# Patient Record
Sex: Female | Born: 1950
Health system: Southern US, Community
[De-identification: ages and names within clinical notes are randomized; demographics above are authoritative.]

## PROBLEM LIST (undated history)

## (undated) DIAGNOSIS — Z955 Presence of coronary angioplasty implant and graft: Secondary | ICD-10-CM

## (undated) DIAGNOSIS — E079 Disorder of thyroid, unspecified: Secondary | ICD-10-CM

## (undated) DIAGNOSIS — A0472 Enterocolitis due to Clostridium difficile, not specified as recurrent: Secondary | ICD-10-CM

## (undated) DIAGNOSIS — R519 Headache, unspecified: Secondary | ICD-10-CM

## (undated) DIAGNOSIS — K649 Unspecified hemorrhoids: Secondary | ICD-10-CM

## (undated) DIAGNOSIS — D689 Coagulation defect, unspecified: Secondary | ICD-10-CM

## (undated) DIAGNOSIS — M1990 Unspecified osteoarthritis, unspecified site: Secondary | ICD-10-CM

## (undated) DIAGNOSIS — C50919 Malignant neoplasm of unspecified site of unspecified female breast: Secondary | ICD-10-CM

## (undated) DIAGNOSIS — I10 Essential (primary) hypertension: Secondary | ICD-10-CM

## (undated) DIAGNOSIS — F329 Major depressive disorder, single episode, unspecified: Secondary | ICD-10-CM

## (undated) DIAGNOSIS — C169 Malignant neoplasm of stomach, unspecified: Secondary | ICD-10-CM

## (undated) DIAGNOSIS — R51 Headache: Secondary | ICD-10-CM

## (undated) DIAGNOSIS — E039 Hypothyroidism, unspecified: Secondary | ICD-10-CM

## (undated) DIAGNOSIS — K219 Gastro-esophageal reflux disease without esophagitis: Secondary | ICD-10-CM

## (undated) DIAGNOSIS — K602 Anal fissure, unspecified: Secondary | ICD-10-CM

## (undated) DIAGNOSIS — K922 Gastrointestinal hemorrhage, unspecified: Secondary | ICD-10-CM

## (undated) DIAGNOSIS — C50319 Malignant neoplasm of lower-inner quadrant of unspecified female breast: Secondary | ICD-10-CM

## (undated) DIAGNOSIS — Z923 Personal history of irradiation: Secondary | ICD-10-CM

## (undated) DIAGNOSIS — Z9221 Personal history of antineoplastic chemotherapy: Secondary | ICD-10-CM

## (undated) DIAGNOSIS — K589 Irritable bowel syndrome without diarrhea: Secondary | ICD-10-CM

## (undated) DIAGNOSIS — I219 Acute myocardial infarction, unspecified: Secondary | ICD-10-CM

## (undated) DIAGNOSIS — N189 Chronic kidney disease, unspecified: Secondary | ICD-10-CM

## (undated) DIAGNOSIS — F419 Anxiety disorder, unspecified: Secondary | ICD-10-CM

## (undated) DIAGNOSIS — F32A Depression, unspecified: Secondary | ICD-10-CM

## (undated) DIAGNOSIS — I509 Heart failure, unspecified: Secondary | ICD-10-CM

## (undated) HISTORY — DX: Coagulation defect, unspecified: D68.9

## (undated) HISTORY — PX: VASCULAR SURGERY: SHX849

## (undated) HISTORY — DX: Acute myocardial infarction, unspecified: I21.9

## (undated) HISTORY — PX: CARDIAC CATHETERIZATION: SHX172

## (undated) HISTORY — DX: Disorder of thyroid, unspecified: E07.9

## (undated) HISTORY — DX: Irritable bowel syndrome, unspecified: K58.9

## (undated) HISTORY — PX: PORTA CATH INSERTION: CATH118285

## (undated) HISTORY — DX: Unspecified hemorrhoids: K64.9

## (undated) HISTORY — DX: Malignant neoplasm of lower-inner quadrant of unspecified female breast: C50.319

## (undated) HISTORY — DX: Heart failure, unspecified: I50.9

---

## 2004-03-12 ENCOUNTER — Other Ambulatory Visit: Payer: Self-pay

## 2006-03-06 ENCOUNTER — Ambulatory Visit: Payer: Self-pay

## 2007-11-18 ENCOUNTER — Ambulatory Visit: Payer: Self-pay | Admitting: Family Medicine

## 2008-02-03 ENCOUNTER — Ambulatory Visit: Payer: Self-pay | Admitting: Family Medicine

## 2008-02-05 ENCOUNTER — Other Ambulatory Visit: Payer: Self-pay

## 2008-02-05 ENCOUNTER — Emergency Department: Payer: Self-pay | Admitting: Emergency Medicine

## 2009-07-01 ENCOUNTER — Ambulatory Visit: Payer: Self-pay | Admitting: Family Medicine

## 2010-01-18 ENCOUNTER — Ambulatory Visit: Payer: Self-pay | Admitting: Family Medicine

## 2010-05-17 ENCOUNTER — Ambulatory Visit: Payer: Self-pay | Admitting: Otolaryngology

## 2010-05-17 ENCOUNTER — Other Ambulatory Visit: Payer: Self-pay | Admitting: Family Medicine

## 2010-06-02 ENCOUNTER — Ambulatory Visit: Payer: Self-pay | Admitting: Vascular Surgery

## 2010-06-22 ENCOUNTER — Ambulatory Visit: Payer: Self-pay | Admitting: Vascular Surgery

## 2010-06-29 ENCOUNTER — Inpatient Hospital Stay: Payer: Self-pay | Admitting: Vascular Surgery

## 2010-07-17 DIAGNOSIS — I219 Acute myocardial infarction, unspecified: Secondary | ICD-10-CM

## 2010-07-17 HISTORY — DX: Acute myocardial infarction, unspecified: I21.9

## 2010-07-27 ENCOUNTER — Ambulatory Visit: Payer: Self-pay | Admitting: Family Medicine

## 2010-09-06 ENCOUNTER — Ambulatory Visit: Payer: Self-pay | Admitting: Gastroenterology

## 2010-09-15 ENCOUNTER — Ambulatory Visit: Payer: Self-pay | Admitting: Gastroenterology

## 2010-09-15 HISTORY — PX: UPPER GI ENDOSCOPY: SHX6162

## 2010-09-15 HISTORY — PX: COLONOSCOPY W/ BIOPSIES: SHX1374

## 2010-09-16 LAB — PATHOLOGY REPORT

## 2010-09-29 ENCOUNTER — Ambulatory Visit: Payer: Self-pay | Admitting: Gastroenterology

## 2011-04-06 ENCOUNTER — Ambulatory Visit: Payer: Self-pay | Admitting: Family Medicine

## 2011-04-11 ENCOUNTER — Other Ambulatory Visit: Payer: Self-pay | Admitting: Internal Medicine

## 2011-04-11 ENCOUNTER — Inpatient Hospital Stay: Payer: Self-pay | Admitting: Internal Medicine

## 2011-06-12 ENCOUNTER — Ambulatory Visit: Payer: Self-pay | Admitting: Internal Medicine

## 2011-07-18 DIAGNOSIS — I509 Heart failure, unspecified: Secondary | ICD-10-CM

## 2011-07-18 HISTORY — DX: Heart failure, unspecified: I50.9

## 2011-09-02 ENCOUNTER — Observation Stay: Payer: Self-pay | Admitting: Internal Medicine

## 2011-09-02 LAB — COMPREHENSIVE METABOLIC PANEL
Albumin: 4.3 g/dL (ref 3.4–5.0)
Alkaline Phosphatase: 97 U/L (ref 50–136)
Anion Gap: 14 (ref 7–16)
BUN: 7 mg/dL (ref 7–18)
Calcium, Total: 9.4 mg/dL (ref 8.5–10.1)
Creatinine: 1.05 mg/dL (ref 0.60–1.30)
Glucose: 131 mg/dL — ABNORMAL HIGH (ref 65–99)
Osmolality: 255 (ref 275–301)
Potassium: 3.4 mmol/L — ABNORMAL LOW (ref 3.5–5.1)
Sodium: 127 mmol/L — ABNORMAL LOW (ref 136–145)
Total Protein: 7.5 g/dL (ref 6.4–8.2)

## 2011-09-02 LAB — CK TOTAL AND CKMB (NOT AT ARMC)
CK, Total: 212 U/L (ref 21–215)
CK, Total: 214 U/L (ref 21–215)

## 2011-09-02 LAB — MAGNESIUM: Magnesium: 1.5 mg/dL — ABNORMAL LOW

## 2011-09-02 LAB — CBC
HCT: 37.8 % (ref 35.0–47.0)
HGB: 12.5 g/dL (ref 12.0–16.0)
MCV: 95 fL (ref 80–100)
Platelet: 270 10*3/uL (ref 150–440)
RDW: 12.8 % (ref 11.5–14.5)
WBC: 4.4 10*3/uL (ref 3.6–11.0)

## 2011-09-02 LAB — TROPONIN I
Troponin-I: 0.08 ng/mL — ABNORMAL HIGH
Troponin-I: 0.07 ng/mL — ABNORMAL HIGH

## 2011-09-03 LAB — CBC WITH DIFFERENTIAL/PLATELET
Basophil #: 0 10*3/uL (ref 0.0–0.1)
Eosinophil #: 0.1 10*3/uL (ref 0.0–0.7)
Eosinophil %: 1.1 %
HGB: 11.4 g/dL — ABNORMAL LOW (ref 12.0–16.0)
Lymphocyte #: 1.9 10*3/uL (ref 1.0–3.6)
MCH: 31.5 pg (ref 26.0–34.0)
MCHC: 33.2 g/dL (ref 32.0–36.0)
Monocyte #: 0.4 10*3/uL (ref 0.0–0.7)
Neutrophil %: 52 %
Platelet: 214 10*3/uL (ref 150–440)
RBC: 3.61 10*6/uL — ABNORMAL LOW (ref 3.80–5.20)
RDW: 12.9 % (ref 11.5–14.5)

## 2011-09-03 LAB — BASIC METABOLIC PANEL
Anion Gap: 7 (ref 7–16)
BUN: 11 mg/dL (ref 7–18)
Co2: 24 mmol/L (ref 21–32)
Creatinine: 1.18 mg/dL (ref 0.60–1.30)
EGFR (African American): 60 — ABNORMAL LOW
Glucose: 90 mg/dL (ref 65–99)
Potassium: 4.4 mmol/L (ref 3.5–5.1)
Sodium: 132 mmol/L — ABNORMAL LOW (ref 136–145)

## 2011-09-03 LAB — TROPONIN I: Troponin-I: 0.06 ng/mL — ABNORMAL HIGH

## 2011-09-03 LAB — CK TOTAL AND CKMB (NOT AT ARMC)
CK, Total: 195 U/L (ref 21–215)
CK-MB: 2.7 ng/mL (ref 0.5–3.6)

## 2011-11-06 ENCOUNTER — Ambulatory Visit: Payer: Self-pay | Admitting: Internal Medicine

## 2011-11-22 ENCOUNTER — Ambulatory Visit: Payer: Self-pay | Admitting: Nephrology

## 2012-01-03 ENCOUNTER — Inpatient Hospital Stay: Payer: Self-pay | Admitting: Internal Medicine

## 2012-01-03 LAB — CBC
HCT: 33.9 % — ABNORMAL LOW (ref 35.0–47.0)
HGB: 11.2 g/dL — ABNORMAL LOW (ref 12.0–16.0)
MCH: 30.6 pg (ref 26.0–34.0)
MCHC: 32.9 g/dL (ref 32.0–36.0)
MCV: 93 fL (ref 80–100)
Platelet: 321 10*3/uL (ref 150–440)
RBC: 3.65 10*6/uL — ABNORMAL LOW (ref 3.80–5.20)
RDW: 12.8 % (ref 11.5–14.5)

## 2012-01-03 LAB — COMPREHENSIVE METABOLIC PANEL
Albumin: 3.5 g/dL (ref 3.4–5.0)
Anion Gap: 9 (ref 7–16)
Chloride: 105 mmol/L (ref 98–107)
Co2: 25 mmol/L (ref 21–32)
Creatinine: 1.15 mg/dL (ref 0.60–1.30)
EGFR (Non-African Amer.): 51 — ABNORMAL LOW
Potassium: 3.7 mmol/L (ref 3.5–5.1)
SGOT(AST): 53 U/L — ABNORMAL HIGH (ref 15–37)
SGPT (ALT): 36 U/L
Sodium: 139 mmol/L (ref 136–145)
Total Protein: 7.1 g/dL (ref 6.4–8.2)

## 2012-01-03 LAB — PRO B NATRIURETIC PEPTIDE: B-Type Natriuretic Peptide: 4152 pg/mL — ABNORMAL HIGH (ref 0–125)

## 2012-01-03 LAB — PROTIME-INR: Prothrombin Time: 12.7 secs (ref 11.5–14.7)

## 2012-01-03 LAB — CK TOTAL AND CKMB (NOT AT ARMC)
CK, Total: 133 U/L (ref 21–215)
CK, Total: 141 U/L (ref 21–215)
CK-MB: 1.1 ng/mL (ref 0.5–3.6)
CK-MB: 2.8 ng/mL (ref 0.5–3.6)

## 2012-01-03 LAB — TROPONIN I
Troponin-I: 0.04 ng/mL
Troponin-I: 0.35 ng/mL — ABNORMAL HIGH

## 2012-01-04 LAB — COMPREHENSIVE METABOLIC PANEL
Albumin: 3.7 g/dL (ref 3.4–5.0)
Alkaline Phosphatase: 112 U/L (ref 50–136)
Anion Gap: 8 (ref 7–16)
Bilirubin,Total: 0.5 mg/dL (ref 0.2–1.0)
Calcium, Total: 9.2 mg/dL (ref 8.5–10.1)
Chloride: 97 mmol/L — ABNORMAL LOW (ref 98–107)
Co2: 30 mmol/L (ref 21–32)
Creatinine: 1.4 mg/dL — ABNORMAL HIGH (ref 0.60–1.30)
EGFR (Non-African Amer.): 40 — ABNORMAL LOW
Glucose: 107 mg/dL — ABNORMAL HIGH (ref 65–99)
Osmolality: 276 (ref 275–301)
Potassium: 4 mmol/L (ref 3.5–5.1)
SGOT(AST): 26 U/L (ref 15–37)
SGPT (ALT): 27 U/L
Sodium: 135 mmol/L — ABNORMAL LOW (ref 136–145)

## 2012-01-04 LAB — CK TOTAL AND CKMB (NOT AT ARMC)
CK, Total: 130 U/L (ref 21–215)
CK-MB: 2.6 ng/mL (ref 0.5–3.6)

## 2012-01-04 LAB — CBC WITH DIFFERENTIAL/PLATELET
Basophil #: 0.1 10*3/uL (ref 0.0–0.1)
Basophil %: 1.3 %
Eosinophil #: 0.2 10*3/uL (ref 0.0–0.7)
HCT: 36.4 % (ref 35.0–47.0)
Lymphocyte #: 2.6 10*3/uL (ref 1.0–3.6)
MCH: 30.6 pg (ref 26.0–34.0)
MCHC: 33.6 g/dL (ref 32.0–36.0)
MCV: 91 fL (ref 80–100)
Monocyte #: 0.7 x10 3/mm (ref 0.2–0.9)
Platelet: 309 10*3/uL (ref 150–440)

## 2012-01-05 LAB — BASIC METABOLIC PANEL
Calcium, Total: 9.4 mg/dL (ref 8.5–10.1)
Creatinine: 1.25 mg/dL (ref 0.60–1.30)
EGFR (African American): 54 — ABNORMAL LOW
EGFR (Non-African Amer.): 46 — ABNORMAL LOW
Glucose: 107 mg/dL — ABNORMAL HIGH (ref 65–99)
Potassium: 4.8 mmol/L (ref 3.5–5.1)
Sodium: 132 mmol/L — ABNORMAL LOW (ref 136–145)

## 2012-07-17 DIAGNOSIS — Z923 Personal history of irradiation: Secondary | ICD-10-CM

## 2012-07-17 DIAGNOSIS — C50919 Malignant neoplasm of unspecified site of unspecified female breast: Secondary | ICD-10-CM

## 2012-07-17 DIAGNOSIS — Z9221 Personal history of antineoplastic chemotherapy: Secondary | ICD-10-CM

## 2012-07-17 HISTORY — DX: Personal history of antineoplastic chemotherapy: Z92.21

## 2012-07-17 HISTORY — PX: MASTECTOMY: SHX3

## 2012-07-17 HISTORY — PX: BREAST BIOPSY: SHX20

## 2012-07-17 HISTORY — DX: Personal history of irradiation: Z92.3

## 2012-07-17 HISTORY — DX: Malignant neoplasm of unspecified site of unspecified female breast: C50.919

## 2012-11-18 ENCOUNTER — Ambulatory Visit: Payer: Self-pay | Admitting: Internal Medicine

## 2012-12-05 ENCOUNTER — Ambulatory Visit: Payer: Self-pay | Admitting: Internal Medicine

## 2013-01-20 ENCOUNTER — Ambulatory Visit: Payer: Self-pay | Admitting: Emergency Medicine

## 2013-01-20 DIAGNOSIS — C50319 Malignant neoplasm of lower-inner quadrant of unspecified female breast: Secondary | ICD-10-CM

## 2013-01-20 HISTORY — DX: Malignant neoplasm of lower-inner quadrant of unspecified female breast: C50.319

## 2013-01-27 ENCOUNTER — Ambulatory Visit: Payer: Self-pay | Admitting: General Surgery

## 2013-01-27 ENCOUNTER — Encounter: Payer: Self-pay | Admitting: General Surgery

## 2013-01-27 ENCOUNTER — Ambulatory Visit (INDEPENDENT_AMBULATORY_CARE_PROVIDER_SITE_OTHER): Payer: BC Managed Care – PPO | Admitting: General Surgery

## 2013-01-27 VITALS — BP 120/78 | HR 78 | Resp 16 | Ht 66.0 in | Wt 203.0 lb

## 2013-01-27 DIAGNOSIS — C50311 Malignant neoplasm of lower-inner quadrant of right female breast: Secondary | ICD-10-CM

## 2013-01-27 DIAGNOSIS — C50319 Malignant neoplasm of lower-inner quadrant of unspecified female breast: Secondary | ICD-10-CM

## 2013-01-27 DIAGNOSIS — N63 Unspecified lump in unspecified breast: Secondary | ICD-10-CM

## 2013-01-27 LAB — PATHOLOGY REPORT

## 2013-01-27 NOTE — Progress Notes (Signed)
Patient ID: Abigail Ortega, female   DOB: 1951-04-28, 62 y.o.   MRN: [id number]  Chief Complaint  Patient presents with  . Breast Problem    HPI Abigail Ortega is a 62 y.o. female who presents for a breast evaluation. Patient had gone to [facility] for routine mammogram 12/04/12, she returned for additional views on 12/05/12. She then had a right breast stereo biopsy done on 01/20/13 by the radiologist at [facility].   Patient denies any breast symptoms, no soreness, discharge, nor has she noticed any lumps or bumps. Patient had a mother with breast cancer. Patient does preform self breast exams and does get routine mammograms.  The patient is accompanied today by her sister was present for the interview and exam.. HPI  Past Medical History  Diagnosis Date  . CHF (congestive heart failure) 2013  . Heart attack 2012  . Clotting disorder     blood clots  . IBS (irritable bowel syndrome)   . Thyroid disease   . Hemorrhoids     Past Surgical History  Procedure Laterality Date  . Vascular surgery    . Carotid stent  2012    2 stents    Family History  Problem Relation Age of Onset  . Cancer Mother 41    breast  . Cancer Father     colon    Social History History  Substance Use Topics  . Smoking status: Former Smoker -- 20 years  . Smokeless tobacco: Never Used  . Alcohol Use: No    No Known Allergies  Current Outpatient Prescriptions  Medication Sig Dispense Refill  . acetaminophen (TYLENOL) 500 MG tablet Take 500 mg by mouth every 6 (six) hours as needed for pain.      Marland Kitchen atorvastatin (LIPITOR) 40 MG tablet Take 1 tablet by mouth daily.      . clopidogrel (PLAVIX) 75 MG tablet Take 1 tablet by mouth daily.      . [facility] ASPIRIN 81 MG EC tablet Take 1 tablet by mouth daily.      . furosemide (LASIX) 20 MG tablet Take 1 tablet by mouth daily.      Marland Kitchen levothyroxine (SYNTHROID, LEVOTHROID) 112 MCG tablet Take 1 tablet by mouth daily.      Marland Kitchen loratadine (CLARITIN) 10 MG tablet Take 10  mg by mouth daily.      . pantoprazole (PROTONIX) 40 MG tablet Take 1 tablet by mouth daily.      Marland Kitchen PARoxetine (PAXIL) 40 MG tablet Take 40 mg by mouth daily.      . ramipril (ALTACE) 5 MG capsule Take 1 capsule by mouth daily.      . TOPROL XL 25 MG 24 hr tablet Take 1 tablet by mouth daily.       No current facility-administered medications for this visit.    Review of Systems Review of Systems  Constitutional: Negative.   Respiratory: Negative.   Cardiovascular: Negative.     Blood pressure 120/78, pulse 78, resp. rate 16, height 5\' 6"  (1.676 m), weight 203 lb (92.08 kg).  Physical Exam Physical Exam  Constitutional: She is oriented to person, place, and time. She appears well-developed and well-nourished.  Eyes: Conjunctivae are normal. No scleral icterus.  Neck: Neck supple.  Cardiovascular: Normal rate and regular rhythm.   Pulmonary/Chest: Effort normal and breath sounds normal. Right breast exhibits no inverted nipple, no mass, no nipple discharge, no skin change and no tenderness. Left breast exhibits no inverted nipple, no mass, no nipple discharge,  no skin change and no tenderness.    Lymphadenopathy:    She has no cervical adenopathy.    She has no axillary adenopathy.  Neurological: She is alert and oriented to person, place, and time.    Data Reviewed Pathology showed invasive ductal carcinoma.  Ultrasound examination was completed to determine that the biopsy cavity could be identified. 4 cm from the nipple to 3:30 o'clock position of the right breast a 0.9 x 1.1 x 1.1 cm biopsy cavity is evident. This is less than 1 cm from the overlying skin.  Assessment    Right breast cancer.   Past history cardiac stent 2012.  Past history congestive heart failure 2013.    Plan    Options for management were reviewed: 1) breast conservation with postoperative radiation therapy versus mastectomy. The pros and cons of each were reviewed. These were present as  equivalent procedures. Laboratory and radiologic studies will be necessary.  The patient denies any recent cardiac symptoms, and shows no evidence of congestive heart failure on today's exam.     Patient to have the following labs drawn today: CBC, Met C, CEA, and CA 27-29. Also, patient to have a chest x-ray PA and lateral at [facility].    Earline Mayotte 01/28/2013, 8:40 PM

## 2013-01-27 NOTE — Patient Instructions (Addendum)
Breast Cancer, Early Stage, Surgery Choices Breast cancer is the most common cancer, except for skin cancer. It is the second most common cause of death, except for lung cancer, in women. The risk of a woman developing breast cancer is 12.5%. Breast cancer in women younger than 63 years old is rare. Most of these cancer cases have spread to the breast from another part of the body (metastatic). Finding out that you have breast cancer is an emotional shock and is difficult to understand, because it is an overwhelming, serious, and life-threatening disease. You will need to make important decisions about how you want it treated.  As a woman with early-stage breast cancer (DCIS or Stage I, IIA, IIB, IIIA, or IV) you may be able to choose which type of breast surgery to have. Your caregiver will help you understand what stage you are in. Often, your choices are:  Removal of the cancerous part(s) of the breast (breast-sparing surgery).  Lumpectomy.  Partial/Segmental mastectomy.  Removal of the whole breast (simple mastectomy). Research shows that women with early-stage breast cancer who have breast-sparing surgery along with radiation therapy live as long as those who have a mastectomy. Most women with breast cancer will lead long, healthy lives after treatment.  Reconstructive surgery. This type of surgery is to make the breast and nipple area as normal looking as it was before the mastectomy. It is usually done by a plastic surgeon at the same time as the mastectomy. There are several types of reconstructive surgery that should be discussed with your caregiver and plastic surgeon.  Lymph node surgery.  Sentinel (removing those lymph nodes in the armpit that breast cancer spreads to first).  Axillary lymph node dissection (removing all the lymph nodes in the armpit). TREATMENT Treatment for breast cancer usually begins a few weeks after diagnosis. In these weeks, you should meet with a surgeon,  learn the facts about your surgery choices, treatment options, get a second opinion, and think about what is important to you.  Treatment choices include:  Surgery.  Radiation.  Chemotherapy.  Medicines, hormones.  Reconstructive breast surgery.  Any combination of the above treatments. Choose which kind of surgery to have. If radiation, chemotherapy, or hormone therapy is considered, this also should be discussed before the surgery. Radical breast surgery is rarely performed now. Usually, lumpectomy, partial/segmental mastectomy, simple mastectomy in combination with radiation, chemotherapy, and/or hormone treatment is recommended. Clinical trial protocols (specific treatment plan with surgery and radiation, chemotherapy, and hormones) for breast cancer have been put in place in hospitals, universities, medical schools, and cancer centers all over the country. These patients are followed for several years to find out what treatment gives the best results for the protocol given, for the different stages of breast cancer.  Most women want to make this choice with help from their caregiver. After all, the kind of surgery you have will affect how you look and feel. But it is often hard to decide what to do. The more information you have, the better the decision you can make.  Talk to a surgeon about your breast cancer surgery choices. Find out what happens during surgery, types of problems that sometimes occur, and other kinds of treatment (if any) you will need after surgery. Be sure to ask a lot of questions and learn as much as you can. You may also wish to talk with family members, friends, or others who have had breast cancer surgery, radiation therapy, chemotherapy, or hormone therapy.  After talking with a surgeon, you may want a second opinion. This means talking with another doctor or surgeon who might tell you about other treatment options. They may simply give you information that can help  you feel better about the choice you are making. Do not worry about hurting your surgeon's feelings. It is common practice to get a second opinion, and some insurance companies require it. Plus, it is better to get a second opinion than to worry that you have made the wrong choice. STAGES OF BREAST CANCER Staging of breast cancer depends on the size of the tumor, if and how far it has spread, lymph node involvement in the armpits, and whether it has spread to other parts of the body. If you are unsure of the stage of your cancer, ask your caregiver. The following are the stages of breast cancer:  Stage 0: This means you either have DCIS or LCIS.  DCIS (Ductal Carcinoma in Situ) is very early breast cancer. It is often too small to form a lump. Your caregiver may refer to DCIS as noninvasive cancer.  LCIS (Lobular Carcinoma in Situ) is not cancer, but it may increase the chance that you will get breast cancer. Talk with your caregiver about treatment options, if you are diagnosed with LCIS.  The 5 year survival rate in this stage is almost 100%.  Stage I:  Your cancer is less than 1 inch across (2 centimeters) or about the size of a quarter. The cancer is only in the breast. It has not spread to lymph nodes or other parts of your body.  Stage IIA:  No cancer is found in your breast. However, cancer is found in the lymph nodes under your arm, or  Your cancer is 1 inch (2 centimeters) or smaller. It has spread to 3 lymph nodes or less (the lymph nodes under your arm), or  Your cancer is about 1-2 inches (2-5 centimeters). It has not spread to the lymph nodes under your arm.  Stage IIB:  Your cancer is about 1-2 inches (2-5 centimeters). It has spread to the lymph nodes under your arm, or  Your cancer is larger than 2 inches (5 centimeters). It has not spread to the lymph nodes under your arm.  The 5 year survival rate is 85 to 90%.  Stage IIIA:  No cancer is found in the breast. It is  found in lymph nodes under your arm. The lymph nodes are attached to each other, or  Your cancer is 2 inches (5 centimeters) or smaller. It has spread to lymph nodes under your arm. The lymph nodes are attached to each other, or  Your cancer is larger than 2 inches (5 centimeters) and has spread to lymph nodes under your arm, and they are not attached to each other.  Your cancer is smaller than 2 inches (5 centimeters) and has spread to lymph nodes above your collarbone.  Inflammatory cancer of the breast (red rash, tender and swollen breasts) is in the stage III category.  The 5 year survival rate is 45 to 67%.  Stage IV:  Cancer cells have spread to other parts of the body.  The 5 year survival rate is about 20%. ABOUT LYMPH NODES  Lymph nodes are part of your body's immune system, which helps fight infection and disease. Lymph nodes are small, round, and clustered (like a bunch of grapes) throughout your body.  Axillary lymph nodes are in the area under your arm. Breast cancer  may spread to these lymph nodes first, even when the tumor in the breast is small. This is why most surgeons take out some of these lymph nodes at the time of breast surgery.  Lymphedema is swelling caused by a buildup of lymph fluid. You may have this type of swelling in your arm, if your lymph nodes are taken out with surgery or damaged by radiation therapy.  Lymphedema can show up soon after surgery. The symptoms are often mild and last for a short time.  Lymphedema can show up months or even years after cancer treatment is over. Often, lymphedema develops after an insect bite, minor injury, or burn on the arm where your lymph nodes were removed. Sometimes, this can be painful. One way to reduce the swelling is to work with a caregiver who specializes in rehabilitation, or with a physical therapist.  Sentinel lymph node biopsy is surgery to remove as few lymph nodes as possible from under the arm. The surgeon  first injects a dye in the breast to see which lymph nodes the breast tumor drains into. Then, he or she removes these nodes to see if they have any cancer. If there is no cancer, the surgeon may leave the other lymph nodes in place. This surgery is fairly new and is being studied experimentally. Talk with your surgeon if you want to learn more. COMPARE YOUR CHOICES  BREAST-SPARING SURGERY  Is this surgery right for me?   Breast-sparing surgery with radiation is a safe choice for most women who have early-stage breast cancer. This means that your cancer is DCIS or at Stage I, IIA, IIB, or IIIA. What are the names of the different kinds of surgery?  Lumpectomy.  Partial mastectomy.  Breast-sparing surgery.  Segmental mastectomy.  Simple mastectomy.  Sentinel lymph node removal.  Axillary lymph node excision. What doctors am I likely to see?  Oncologist.  Surgeon.  Radiation Oncologist.  Chemotherapy Oncologist. What will my breast look like after surgery?  Your breast should look a lot like it did before surgery. But if your tumor is large, your breast may look different or smaller after breast-sparing surgery. Will I have feeling in the area around my breast?  Yes. You should still have feeling in your breast, nipple, and areola (dark area around your nipple). Will I have pain after the surgery?  You may have pain after surgery. Talk with your caregiver about ways to control this pain. What other problems can I expect?  You may feel very tired after radiation therapy. You may get swelling in your arm (lymphedema). Will I need more surgery?  Maybe. You may need more surgery to remove lymph nodes from under your arm. Also, if the surgeon does not remove all your cancer the first time, you may need more surgery. What other types of treatment will I need?  You may need radiation therapy, given almost every day for 5 to 8 weeks. You may also need chemotherapy, hormone  therapy, or both. Will insurance pay for my surgery?  Check with your insurance company to find out how much it pays for breast cancer surgery and other needed treatments. Will the type of surgery I have affect how long I live?  Women with early-stage breast cancer who have breast-sparing surgery followed by radiation live just as long as women who have a mastectomy. Most women with breast cancer will lead long, healthy lives after treatment. What are the chances that my cancer will come back after  surgery?  About 10% of women who have breast-sparing surgery along with radiation therapy get cancer in the same breast within 12 years. If this happens, you will need a mastectomy, but it will not affect how long you live. MASTECTOMY SURGERY Is this surgery right for me?   Mastectomy is a safe choice for women who have early-stage breast cancer (DCIS, Stage I, IIA, IIB, or IIIA). You may need a mastectomy if:  You have small breasts and a large tumor.  You have cancer in more than one part of your breast.  The tumor is under the nipple.  You do not have access to radiation therapy. What are the names of the different kinds of surgery?  Total mastectomy.  Modified radical mastectomy (not usually done now).  Double mastectomy.  Simple mastectomy. What doctors am I likely to see?  Oncologist.  Surgeon.  Radiation Oncologist.  Chemotherapy Oncologist. What will my breast look like after surgery?  Your breast and nipple will be removed. You will have a flat chest on the side of your body where the breast was removed. Will I have feeling in the area around my breast?  Maybe. After surgery, you will have no feeling (numb) in your chest wall and maybe also under your arm. This numb feeling should go away in 1-2 years, but it will never feel like it did before. Also, the skin where your breast was may feel tight. Will I have pain after the surgery?  You may have pain after surgery.  Talk with your caregiver about ways to control this pain. What other problems can I expect?  You may have pain in your neck or back. You may feel out of balance, if you had large breasts and do not have reconstruction surgery. You may get swelling in your arm (lymphedema). Will I need more surgery?  Maybe. You may need surgery to remove lymph nodes from under your arm. Also, if you have problems after your mastectomy, you may need to see your surgeon for treatment. What other types of treatment will I need?  You may also need chemotherapy, hormone therapy, or radiation therapy. Some women get all 3 types of therapy or any combination of the 3. Will insurance pay for my surgery?  Check with your insurance company to find out how much it pays for breast cancer surgery and other needed treatments. Will the type of surgery I have affect how long I live?  Women with early-stage breast cancer who have a mastectomy live as long as women who have breast-sparing surgery followed by radiation therapy. Most women with breast cancer will lead long, healthy lives after treatment. What are the chances that my cancer will come back after surgery?  About 5% of women who have a mastectomy will get cancer on the same side of their chest within 12 years. MASTECTOMY AND BREAST RECONSTRUCTION SURGERY  Is this surgery right for me?  If you have a mastectomy, you might also want breast reconstruction surgery.  You can choose to have reconstruction surgery at the same time as your mastectomy.  You can wait and have it at a later date. What are the names of the different kinds of surgery?  Breast implant.  Tissue flap surgery.  Reconstructive plastic surgery. What doctors am I likely to see?  Oncologist.  Surgeon.  Radiation Oncologist.  Reconstructive Plastic Surgeon.  Chemotherapy Oncologist. What will my breast look like after surgery?  Although you will have a breast-like shape, your  breast  will not look the same as it did before surgery. Will I have feeling in the area around my breast?  No. The area around your breast will always be numb (have no feeling). Will I have pain after the surgery?  You are likely to have pain after major surgery, such as mastectomy and reconstruction surgery. There are many ways to deal with pain. Let your caregiver know if you need relief from pain. What other problems can I expect?  It may take you many weeks or even months to recover from breast reconstruction surgery. If you have an implant, you may get infections, pain, or hardness. Also, you may not like how your breast-like shape looks. You may need more surgery if your implant breaks or leaks. If you have tissue flap surgery, you may lose strength in the part of your body where the flap came from. You may get swelling in your arm (lymphedema). Will I need more surgery?  Yes. You will need surgery at least 2 more times to build a new breast-like shape. With implants, you may need more surgery months or years later. You may also need surgery to remove lymph nodes from under your arm. What other types of treatment will I need?  You may need chemotherapy, hormone therapy, or radiation therapy. Some women get all 3 types of therapy or any combination of the 3. Will insurance pay for my surgery?  Check with your insurance company to find out if it pays for breast reconstruction surgery. You should also ask if your insurance will pay for problems that may result from breast reconstruction surgery. Will the type of surgery I have affect how long I live?  Women with early-stage breast cancer who have a mastectomy live the same amount of time as women who have breast-sparing surgery followed by radiation therapy. Most women with breast cancer will lead long, healthy lives after treatment. What are the chances that my cancer will come back after surgery?  About 5% of women who have a mastectomy will  get cancer on the same side of their chest within 12 years. Breast reconstruction surgery does not affect the chances of your cancer coming back. Where can I learn more about coping with life after cancer? To learn more about life after cancer, you might want to read "Facing Forward: Life After Cancer Treatment." You can get this booklet at [employer] or by calling 1-800-4-CANCER. THINK ABOUT WHAT IS IMPORTANT TO YOU   After you have talked with your surgeon and learned the facts, you may also want to talk with your spouse or partner, family, friends, or other women who have had breast cancer surgery. The more you know about breast cancer, the treatment, and what happens afterward, the more informed you will be and the easier it will be for you to make good decisions that are important to you.  Think about what is important to you. Here are some questions to think about:  Do I want to get a second opinion?  How important is it to me how my breast looks after cancer surgery?  How important is it to me how my breast feels after cancer surgery?  If I have breast-sparing surgery or more extensive surgery, am I willing to get radiation, hormone and/or chemotherapy?  If I have a mastectomy, do I also want breast reconstruction surgery?  If I have breast reconstruction surgery, do I want it at the same time as my mastectomy?  What treatment  does my insurance cover, and what do I need to pay for?  Who would I like to talk with about my surgery, radiation, hormone, and chemotherapy choices?  What else do I want to know, do, or learn before I make my choice about breast cancer surgery?  After I have learned all I could and have talked with my surgeon, I will make a choice that feels right for me.  A patient who takes the time to be well-informed will feel more comfortable about her decisions regarding surgery, and will feel better about the procedure following the  surgery.  Coping and Support:  Be open and willing to talk to your family and friends about your cancer. Family and friends can be your best support group, to help you work through your concerns and worries.  Discussing your thoughts, concerns, and intimacy issues with your spouse or partner is very important and helpful.  Join support groups to share and learn how others with breast cancer cope and deal with their cancer. The [employer] Society can help you find support groups in your area.  Breast cancer may affect your confidence and feelings about being a total woman. It may interfere with your intimate relationship with your partner. Counseling may be necessary to help you overcome these feelings.  Talk to a counselor, your clergyperson, psychologist, or psychiatrist.  Talk to a medical social worker, if you have financial questions or problems.  Do not be afraid to ask for help, especially during your treatment.  Learn to be as independent as possible, as soon as possible. Document Released: 09/23/2003 Document Revised: 09/25/2011 Document Reviewed: 05/31/2009 [facility] Patient Information 2014 [place], [place].  Patient to have labs and chest x-ray.

## 2013-01-28 ENCOUNTER — Encounter: Payer: Self-pay | Admitting: General Surgery

## 2013-01-28 DIAGNOSIS — C50311 Malignant neoplasm of lower-inner quadrant of right female breast: Secondary | ICD-10-CM | POA: Insufficient documentation

## 2013-01-28 LAB — CANCER ANTIGEN 27.29: CA 27.29: 31.1 U/mL (ref 0.0–38.6)

## 2013-01-28 LAB — CBC WITH DIFFERENTIAL/PLATELET
Eos: 2 % (ref 0–5)
HCT: 34.2 % (ref 34.0–46.6)
Hemoglobin: 10.9 g/dL — ABNORMAL LOW (ref 11.1–15.9)
Immature Granulocytes: 0 % (ref 0–2)
Lymphocytes Absolute: 1.6 10*3/uL (ref 0.7–3.1)
Lymphs: 23 % (ref 14–46)
Monocytes: 6 % (ref 4–12)
RDW: 13.5 % (ref 12.3–15.4)
WBC: 7 10*3/uL (ref 3.4–10.8)

## 2013-01-28 LAB — COMPREHENSIVE METABOLIC PANEL
ALT: 11 IU/L (ref 0–32)
Calcium: 9.5 mg/dL (ref 8.6–10.2)
Chloride: 104 mmol/L (ref 97–108)
Glucose: 99 mg/dL (ref 65–99)
Potassium: 4.2 mmol/L (ref 3.5–5.2)
Total Protein: 7.1 g/dL (ref 6.0–8.5)

## 2013-01-28 LAB — CEA: CEA: 6.3 ng/mL — ABNORMAL HIGH (ref 0.0–4.7)

## 2013-01-29 ENCOUNTER — Other Ambulatory Visit: Payer: Self-pay | Admitting: *Deleted

## 2013-01-29 ENCOUNTER — Telehealth: Payer: Self-pay | Admitting: *Deleted

## 2013-01-29 DIAGNOSIS — C50319 Malignant neoplasm of lower-inner quadrant of unspecified female breast: Secondary | ICD-10-CM

## 2013-01-29 DIAGNOSIS — R6889 Other general symptoms and signs: Secondary | ICD-10-CM

## 2013-01-29 NOTE — Telephone Encounter (Signed)
Message copied by Nicholes Mango on Wed Jan 29, 2013 10:19 AM ------      Message from: Spencer, Merrily Pew      Created: Tue Jan 28, 2013  4:56 PM       Please notify the patient some of her lab tests were a little off, and I need for her to get a PET/ CT to check them out.      DX: Breast cancer, elevated LFT's and CEA. Thanks. ------

## 2013-01-29 NOTE — Telephone Encounter (Signed)
Patient notified as instructed. This patient has been scheduled for a PET/CT scan at [facility] for 02-03-13 at 1 pm (arrive 12:45 pm). This patient is aware of date, time, and all instructions. She will call if she has further questions.

## 2013-02-03 ENCOUNTER — Ambulatory Visit: Payer: Self-pay | Admitting: General Surgery

## 2013-02-05 ENCOUNTER — Encounter: Payer: Self-pay | Admitting: General Surgery

## 2013-02-07 ENCOUNTER — Telehealth: Payer: Self-pay | Admitting: *Deleted

## 2013-02-07 NOTE — Telephone Encounter (Signed)
Message copied by Nicholes Mango on Fri Feb 07, 2013 10:35 AM ------      Message from: [place], Merrily Pew      Created: Fri Feb 07, 2013  7:18 AM       Notify the patient that PET is OK.  Proceed with scheduling for right breast surgery.  She will need to to have a colonoscopy and upper endoscopy after breast surgery completed.       ----- Message -----         From: Jena Gauss, CMA         Sent: 02/05/2013   4:38 PM           To: Earline Mayotte, MD                   ------

## 2013-02-07 NOTE — Telephone Encounter (Signed)
Patient notified as instructed. She wishes to think about whether she would like to have a lumpectomy vs mastectomy and call the office back next week to arrange. This patient also reports that she has already had an upper and lower endoscopy completed through Dr. Marlan Palau office which patient reported as normal. I have called their office and asked them to forward endoscopy reports and pathology reports (if any).

## 2013-02-09 ENCOUNTER — Emergency Department: Payer: Self-pay | Admitting: Emergency Medicine

## 2013-02-10 ENCOUNTER — Telehealth: Payer: Self-pay | Admitting: *Deleted

## 2013-02-10 NOTE — Telephone Encounter (Signed)
Patient called the office reporting that she had to go to [facility] ED yesterday and was diagnosed with an ear infection and upper respiratory infection. She was given antibiotics. This patient also reports that she would like to proceed with right mastectomy. How long will we need to wait before she can have surgery?  I requested color copies from upper and lower endoscopy from [facility]. This was completed on 09-15-10 by Dr. Bluford Kaufmann not Dr. Niel Hummer as we originally thought. See reports on your desk. I also called and requested pathology report. Cindy from Dr. Nigel Bridgeman office to forward.

## 2013-02-11 ENCOUNTER — Other Ambulatory Visit: Payer: Self-pay | Admitting: General Surgery

## 2013-02-11 DIAGNOSIS — C50319 Malignant neoplasm of lower-inner quadrant of unspecified female breast: Secondary | ICD-10-CM

## 2013-02-12 ENCOUNTER — Telehealth: Payer: Self-pay | Admitting: *Deleted

## 2013-02-12 NOTE — Telephone Encounter (Signed)
Patient is concerned that her surgery will be outpatient. She reports that after talking with her nurse at work that she feels she ought to stay at least one night in the hospital. This patient reports she has no one to take care of her when she gets home. She is also right handed and feels this will make it difficult to be by herself upon discharge. Please advise.  At present she is scheduled for a right mastectomy on 02-21-13 at [facility].

## 2013-02-13 ENCOUNTER — Ambulatory Visit: Payer: Self-pay | Admitting: General Surgery

## 2013-02-15 ENCOUNTER — Telehealth: Payer: Self-pay | Admitting: General Surgery

## 2013-02-15 NOTE — Telephone Encounter (Signed)
Patient had called with reservations re: outpatient surgery rather than overnight observation.  Discussed indications for observation. Likely, game day decision.   Importance of having someone at home the first night reviewed.  She has concerns that [employer]  Will not pay if out patient will have staff clarify.

## 2013-02-18 ENCOUNTER — Telehealth: Payer: Self-pay | Admitting: *Deleted

## 2013-02-18 NOTE — Telephone Encounter (Signed)
States she is feeling much better. States she has a post nasal drip and will try to take a over the counter antihistamine. No fever or chills. Coughing better and mucous is clear. Will call if symptoms change.

## 2013-02-18 NOTE — Telephone Encounter (Signed)
Message copied by Currie Paris on Tue Feb 18, 2013  8:34 AM ------      Message from: Abigail Ortega      Created: Sat Feb 15, 2013 10:11 AM       Patient reported some congestion to [facility] staff at time of preadmit.  On antibiotics. Give her a call and see how she is doing. If no cough and feeling well, we can proceed w/ mastectomy scheduled for 8/8 ------

## 2013-02-21 ENCOUNTER — Ambulatory Visit: Payer: Self-pay | Admitting: General Surgery

## 2013-02-21 DIAGNOSIS — C50319 Malignant neoplasm of lower-inner quadrant of unspecified female breast: Secondary | ICD-10-CM

## 2013-02-21 HISTORY — PX: BREAST SURGERY: SHX581

## 2013-02-24 ENCOUNTER — Encounter: Payer: Self-pay | Admitting: General Surgery

## 2013-02-24 ENCOUNTER — Ambulatory Visit (INDEPENDENT_AMBULATORY_CARE_PROVIDER_SITE_OTHER): Payer: BC Managed Care – PPO | Admitting: General Surgery

## 2013-02-24 DIAGNOSIS — C50319 Malignant neoplasm of lower-inner quadrant of unspecified female breast: Secondary | ICD-10-CM

## 2013-02-24 DIAGNOSIS — J440 Chronic obstructive pulmonary disease with acute lower respiratory infection: Secondary | ICD-10-CM

## 2013-02-24 MED ORDER — AZITHROMYCIN 250 MG PO TABS: ORAL_TABLET | ORAL | Status: DC

## 2013-02-24 NOTE — Progress Notes (Signed)
Patient came in today for a wound check.  The right breast mastectomy looks clean, with no signs of infection noted. Rewrapped patient.Rx given. Follow up as scheduled.

## 2013-02-25 LAB — PATHOLOGY REPORT

## 2013-02-26 ENCOUNTER — Encounter: Payer: Self-pay | Admitting: General Surgery

## 2013-02-28 ENCOUNTER — Ambulatory Visit (INDEPENDENT_AMBULATORY_CARE_PROVIDER_SITE_OTHER): Payer: BC Managed Care – PPO | Admitting: *Deleted

## 2013-02-28 DIAGNOSIS — C50319 Malignant neoplasm of lower-inner quadrant of unspecified female breast: Secondary | ICD-10-CM

## 2013-02-28 NOTE — Progress Notes (Signed)
Patient here today for follow up right mastectomy. Area clean, no signs infection noted. Rewrapped with kerlex and ace for comfort Drain output record sheet continued

## 2013-02-28 NOTE — Patient Instructions (Addendum)
Follow up 03-04-13 Refill hydrocodone #30 per Dr Lemar Livings Continue drain record sheet May use heating pad for comfort -caution reviewed make sure put pad on area that is not "numb"

## 2013-03-04 ENCOUNTER — Ambulatory Visit (INDEPENDENT_AMBULATORY_CARE_PROVIDER_SITE_OTHER): Payer: BC Managed Care – PPO | Admitting: General Surgery

## 2013-03-04 VITALS — BP 130/78 | HR 76 | Resp 14 | Ht 67.0 in | Wt 201.0 lb

## 2013-03-04 DIAGNOSIS — I5042 Chronic combined systolic (congestive) and diastolic (congestive) heart failure: Secondary | ICD-10-CM | POA: Insufficient documentation

## 2013-03-04 DIAGNOSIS — Z9010 Acquired absence of unspecified breast and nipple: Secondary | ICD-10-CM | POA: Insufficient documentation

## 2013-03-04 DIAGNOSIS — C50319 Malignant neoplasm of lower-inner quadrant of unspecified female breast: Secondary | ICD-10-CM

## 2013-03-04 DIAGNOSIS — C50311 Malignant neoplasm of lower-inner quadrant of right female breast: Secondary | ICD-10-CM

## 2013-03-04 DIAGNOSIS — I509 Heart failure, unspecified: Secondary | ICD-10-CM | POA: Insufficient documentation

## 2013-03-04 NOTE — Progress Notes (Signed)
Patient ID: Abigail Ortega, female   DOB: [birth year], 62 y.o.   MRN: [id number]  Chief Complaint  Patient presents with  . Routine Post Op    HPI Abigail Ortega is a 62 y.o. female.  Patient here today for postop visit right mastectomy done 02-21-13.  States she is still having some discomfort and still using pain medications and using heating pad. Drain remains in place. The patient reports some improvement in her cough with a recent use of Zithromax. She does not report any colored sputum.  Drainage volume still running 45-50 cc per day. HPI  Past Medical History  Diagnosis Date  . CHF (congestive heart failure) 2013  . IBS (irritable bowel syndrome)   . Thyroid disease   . Hemorrhoids   . Clotting disorder     blood clots  . Heart attack 2012    coronary stent x2 completed by Dr. Juliann Pares.  . Malignant neoplasm of lower-inner quadrant of female breast January 20, 2013.    invasive mammary cancer, minimal 0.85 cm.  Histologic grade 1.    Past Surgical History  Procedure Laterality Date  . Vascular surgery    . Breast surgery Right 2014    right mastectomy    Family History  Problem Relation Age of Onset  . Cancer Mother 50    breast  . Cancer Father     colon    Social History History  Substance Use Topics  . Smoking status: Former Smoker -- 20 years  . Smokeless tobacco: Never Used  . Alcohol Use: No    No Known Allergies  Current Outpatient Prescriptions  Medication Sig Dispense Refill  . acetaminophen (TYLENOL) 500 MG tablet Take 500 mg by mouth every 6 (six) hours as needed for pain.      Marland Kitchen atorvastatin (LIPITOR) 40 MG tablet Take 1 tablet by mouth daily.      . clopidogrel (PLAVIX) 75 MG tablet Take 1 tablet by mouth daily.      . [facility] ASPIRIN 81 MG EC tablet Take 1 tablet by mouth daily.      . furosemide (LASIX) 20 MG tablet Take 1 tablet by mouth daily.      Marland Kitchen HYDROcodone-acetaminophen (NORCO/VICODIN) 5-325 MG per tablet Take 1 tablet by mouth every  6 (six) hours as needed for pain.      Marland Kitchen levothyroxine (SYNTHROID, LEVOTHROID) 112 MCG tablet Take 1 tablet by mouth daily.      Marland Kitchen loratadine (CLARITIN) 10 MG tablet Take 10 mg by mouth daily.      . pantoprazole (PROTONIX) 40 MG tablet Take 1 tablet by mouth daily.      Marland Kitchen PARoxetine (PAXIL) 40 MG tablet Take 40 mg by mouth daily.      . ramipril (ALTACE) 5 MG capsule Take 1 capsule by mouth daily.      . TOPROL XL 25 MG 24 hr tablet Take 1 tablet by mouth daily.       No current facility-administered medications for this visit.    Review of Systems Review of Systems  Constitutional: Negative.   Respiratory: Positive for cough.   Cardiovascular: Negative.     Blood pressure 130/78, pulse 76, resp. rate 14, height 5\' 7"  (1.702 m), weight 201 lb (91.173 kg).  Physical Exam Physical Exam  Constitutional: She appears well-developed and well-nourished.  Cardiovascular: Normal rate, regular rhythm and normal heart sounds.   Pulmonary/Chest: Breath sounds normal.  Right mastectomy site healing well/    Data Reviewed Pathology  showed 5 of 25 nodes positive. ER/PR positive, HER-2/neu overexpression.  Assessment    Stage II carcinoma of the right breast.    Plan    The patient was advised of adjuvant chemotherapy as appropriate. She was very distressed, reporting that she thought that she had her breast removed chemotherapy would not be recommended. This had not been the case, and treatment of the breast was defined as independent of any recommendations for additional therapy. With the finding of an ER/PR positive tumor especially with HER-2/neu overexpression, she would be a candidate with likely substantial benefit with adjuvant chemotherapy. At this time she is not ready to me with a medical oncologist. Maryclare Labrador review this again next week when she follows up for potential drain removal.       Earline Mayotte 03/05/2013, 5:47 PM

## 2013-03-04 NOTE — Patient Instructions (Signed)
Patient to keep using the heating pad.Patient to return in one week.

## 2013-03-05 ENCOUNTER — Encounter: Payer: Self-pay | Admitting: General Surgery

## 2013-03-06 ENCOUNTER — Ambulatory Visit: Payer: BC Managed Care – PPO | Admitting: General Surgery

## 2013-03-12 ENCOUNTER — Encounter: Payer: Self-pay | Admitting: General Surgery

## 2013-03-12 ENCOUNTER — Ambulatory Visit (INDEPENDENT_AMBULATORY_CARE_PROVIDER_SITE_OTHER): Payer: BC Managed Care – PPO | Admitting: General Surgery

## 2013-03-12 ENCOUNTER — Ambulatory Visit: Payer: Self-pay | Admitting: Oncology

## 2013-03-12 VITALS — BP 114/70 | HR 74 | Resp 12 | Ht 67.0 in | Wt 200.0 lb

## 2013-03-12 DIAGNOSIS — C50319 Malignant neoplasm of lower-inner quadrant of unspecified female breast: Secondary | ICD-10-CM

## 2013-03-12 NOTE — Patient Instructions (Addendum)
Keep improving range of motion Drive if pain free and feels comfortable with driving  Patient has been scheduled for an appointment with Dr. Orlie Dakin at the [facility] for 03-18-13 at 9 am. She is aware of date, time, and instructions.

## 2013-03-12 NOTE — Progress Notes (Signed)
Patient ID: Abigail Ortega, female   DOB: 11-16-1950, 62 y.o.   MRN: [id number]  Chief Complaint  Patient presents with  . Routine Post Op    HPI Abigail Ortega is a 62 y.o. female.  Patient here today for postop visit 02-21-13, Simple Right mastectomy with sentinel node excision. Drain sheet present. States she is doing well.  HPI  Past Medical History  Diagnosis Date  . CHF (congestive heart failure) 2013  . IBS (irritable bowel syndrome)   . Thyroid disease   . Hemorrhoids   . Clotting disorder     blood clots  . Heart attack 2012    coronary stent x2 completed by Dr. Juliann Pares.  . Malignant neoplasm of lower-inner quadrant of female breast January 20, 2013.    invasive mammary cancer, minimal 0.85 cm.  Histologic grade 1.    Past Surgical History  Procedure Laterality Date  . Vascular surgery    . Breast surgery Right 02-21-2013    right mastectomy    Family History  Problem Relation Age of Onset  . Cancer Mother 74    breast  . Cancer Father     colon    Social History History  Substance Use Topics  . Smoking status: Former Smoker -- 20 years  . Smokeless tobacco: Never Used  . Alcohol Use: No    No Known Allergies  Current Outpatient Prescriptions  Medication Sig Dispense Refill  . clopidogrel (PLAVIX) 75 MG tablet Take 1 tablet by mouth daily.      . furosemide (LASIX) 20 MG tablet Take 1 tablet by mouth daily.      Marland Kitchen HYDROcodone-acetaminophen (NORCO/VICODIN) 5-325 MG per tablet Take 1 tablet by mouth every 6 (six) hours as needed for pain.      Marland Kitchen loratadine (CLARITIN) 10 MG tablet Take 10 mg by mouth daily.      . pantoprazole (PROTONIX) 40 MG tablet Take 1 tablet by mouth daily.      Marland Kitchen PARoxetine (PAXIL) 40 MG tablet Take 40 mg by mouth daily.      . ramipril (ALTACE) 5 MG capsule Take 1 capsule by mouth daily.      . TOPROL XL 25 MG 24 hr tablet Take 1 tablet by mouth daily.       No current facility-administered medications for this visit.     Review of Systems Review of Systems  Constitutional: Negative.   Respiratory: Negative.   Cardiovascular: Negative.     Blood pressure 114/70, pulse 74, resp. rate 12, height 5\' 7"  (1.702 m), weight 200 lb (90.719 kg).  Physical Exam Physical Exam  Constitutional: She is oriented to person, place, and time. She appears well-developed and well-nourished.  Neurological: She is alert and oriented to person, place, and time.  Skin: Skin is warm and dry.  Shoulder range of motion is improving.  Examination of the right mastectomy site showed no evidence of loculated fluid collection. Drainage volumes are below 20 cc per day. The drain was removed without incident.  Data Reviewed No new data.  Assessment    Doing well status post right mastectomy with sentinel node biopsy.     Plan    The indications for medical oncology assessment were reviewed. She is agreeable to meet with medical oncology for their evaluation.  We'll plan for a followup examination in one week. She was advised of fluid accumulation post drain removal is not uncommon, and the should not be of concern.      Patient  has been scheduled for an appointment with Dr. Orlie Dakin at the [facility] for 03-18-13 at 9 am. She is aware of date, time, and instructions.   Abigail Ortega 03/13/2013, 7:48 PM

## 2013-03-13 ENCOUNTER — Encounter: Payer: Self-pay | Admitting: General Surgery

## 2013-03-18 ENCOUNTER — Ambulatory Visit: Payer: Self-pay | Admitting: Oncology

## 2013-03-19 ENCOUNTER — Ambulatory Visit (INDEPENDENT_AMBULATORY_CARE_PROVIDER_SITE_OTHER): Payer: BC Managed Care – PPO | Admitting: General Surgery

## 2013-03-19 ENCOUNTER — Encounter: Payer: Self-pay | Admitting: General Surgery

## 2013-03-19 VITALS — BP 120/74 | HR 74 | Resp 12 | Ht 67.0 in | Wt 201.0 lb

## 2013-03-19 DIAGNOSIS — C50319 Malignant neoplasm of lower-inner quadrant of unspecified female breast: Secondary | ICD-10-CM

## 2013-03-19 NOTE — Progress Notes (Addendum)
Patient ID: Abigail Ortega, female   DOB: 1950/07/26, 62 y.o.   MRN: [id number]  Chief Complaint  Patient presents with  . Routine Post Op    HPI Kylynn Street is a 62 y.o. female. Patient here today for postop visit right mastectomy done 02-21-13. Patient states she is doing better. She is still experiencing some pain, especially when stretching.     HPI  Past Medical History  Diagnosis Date  . CHF (congestive heart failure) 2013  . IBS (irritable bowel syndrome)   . Thyroid disease   . Hemorrhoids   . Clotting disorder     blood clots  . Heart attack 2012    coronary stent x2 completed by Dr. Juliann Pares.  . Malignant neoplasm of lower-inner quadrant of female breast January 20, 2013.    invasive mammary cancer, minimal 0.85 cm.  Histologic grade 1.    Past Surgical History  Procedure Laterality Date  . Vascular surgery    . Breast surgery Right 02-21-2013    right mastectomy    Family History  Problem Relation Age of Onset  . Cancer Mother 88    breast  . Cancer Father     colon    Social History History  Substance Use Topics  . Smoking status: Former Smoker -- 20 years  . Smokeless tobacco: Never Used  . Alcohol Use: No    No Known Allergies  Current Outpatient Prescriptions  Medication Sig Dispense Refill  . atorvastatin (LIPITOR) 40 MG tablet Take 1 tablet by mouth daily.      . clopidogrel (PLAVIX) 75 MG tablet Take 1 tablet by mouth daily.      . [facility] ASPIRIN 81 MG EC tablet Take 1 tablet by mouth daily.      . furosemide (LASIX) 20 MG tablet Take 1 tablet by mouth daily.      Marland Kitchen HYDROcodone-acetaminophen (NORCO/VICODIN) 5-325 MG per tablet Take 1 tablet by mouth every 6 (six) hours as needed for pain.      Marland Kitchen loratadine (CLARITIN) 10 MG tablet Take 10 mg by mouth daily.      . pantoprazole (PROTONIX) 40 MG tablet Take 1 tablet by mouth daily.      Marland Kitchen PARoxetine (PAXIL) 40 MG tablet Take 40 mg by mouth daily.      . ramipril (ALTACE) 5 MG capsule Take 1  capsule by mouth daily.      . TOPROL XL 25 MG 24 hr tablet Take 1 tablet by mouth daily.       No current facility-administered medications for this visit.    Review of Systems Review of Systems  Constitutional: Negative.   Respiratory: Negative.   Cardiovascular: Negative.     Blood pressure 120/74, pulse 74, resp. rate 12, height 5\' 7"  (1.702 m), weight 201 lb (91.173 kg).  Physical Exam Physical Exam  Constitutional: She is oriented to person, place, and time. She appears well-developed and well-nourished.  Cardiovascular: Normal rate and regular rhythm.   Pulmonary/Chest: Effort normal and breath sounds normal.  Right mastectomy site healing well looks clean.  Musculoskeletal:       Arms: Neurological: She is alert and oriented to person, place, and time.    Data Reviewed No new data.  Assessment    Invasive carcinoma of the right breast.    Plan    The patient has met with medical oncology and is agreeable to adjuvant chemotherapy. She has yet to restart her Plavix post surgery, and she was again asked  to not resume medication until instructed to do so.  A power port has been requested. The procedure was reviewed. The risks including those related to bleeding, pulmonary injury and infection were reviewed.  The patient was given a new prescription for Norco 5/325, #30 with the inscription one by mouth every 6 hours when necessary for pain. It was emphasized that at this point in time ongoing narcotic use is not her best interest, at that she should minimize her use of the Norco.     Patient's surgery has been scheduled for 03-27-13 at [facility]. This patient has been asked to stop Plavix 7 days prior to procedure.    Earline Mayotte 03/21/2013, 8:12 AM

## 2013-03-19 NOTE — Patient Instructions (Addendum)
Discussed power port.  Patient's surgery has been scheduled for 03-27-13 at [facility].  This patient has been asked to stop Plavix 7 days prior to procedure.

## 2013-03-21 ENCOUNTER — Other Ambulatory Visit: Payer: Self-pay | Admitting: General Surgery

## 2013-03-21 ENCOUNTER — Encounter: Payer: Self-pay | Admitting: General Surgery

## 2013-03-21 DIAGNOSIS — C50311 Malignant neoplasm of lower-inner quadrant of right female breast: Secondary | ICD-10-CM

## 2013-03-27 ENCOUNTER — Ambulatory Visit: Payer: Self-pay | Admitting: General Surgery

## 2013-03-27 DIAGNOSIS — C50919 Malignant neoplasm of unspecified site of unspecified female breast: Secondary | ICD-10-CM

## 2013-03-28 ENCOUNTER — Telehealth: Payer: Self-pay | Admitting: *Deleted

## 2013-03-28 ENCOUNTER — Ambulatory Visit: Payer: Self-pay | Admitting: General Surgery

## 2013-03-28 NOTE — Telephone Encounter (Signed)
Patient called the office complaining of shortness of breath. She is status post port placement done on 03-27-13. The Same Day Surgery nurse called her to see how she was doing and told her to follow up with our office.   Dr. Lemar Livings was notified.  Patient to have a chest x-ray at [facility].

## 2013-03-29 NOTE — Telephone Encounter (Signed)
The patient was evaluated at the outpatient imaging center.  The patient's inspiratory and expiratory chest x-rays were reviewed with the radiologist. No evidence of pneumothorax. Questionable soft medial right middle lobe infiltrate, no definite pneumonia.  Chest exam was clear.  The patient reports her symptoms had improved since her call earlier in the day.  No evidence of pneumothorax or significant cardiopulmonary disease.  The patient will followup as previously scheduled.

## 2013-03-31 ENCOUNTER — Encounter: Payer: Self-pay | Admitting: General Surgery

## 2013-04-01 ENCOUNTER — Encounter: Payer: Self-pay | Admitting: General Surgery

## 2013-04-02 ENCOUNTER — Encounter: Payer: Self-pay | Admitting: General Surgery

## 2013-04-03 LAB — COMPREHENSIVE METABOLIC PANEL
Albumin: 3.2 g/dL — ABNORMAL LOW (ref 3.4–5.0)
Alkaline Phosphatase: 150 U/L — ABNORMAL HIGH (ref 50–136)
Anion Gap: 8 (ref 7–16)
BUN: 17 mg/dL (ref 7–18)
Bilirubin,Total: 0.3 mg/dL (ref 0.2–1.0)
Calcium, Total: 9 mg/dL (ref 8.5–10.1)
EGFR (African American): 53 — ABNORMAL LOW
EGFR (Non-African Amer.): 46 — ABNORMAL LOW
Glucose: 116 mg/dL — ABNORMAL HIGH (ref 65–99)
Osmolality: 289 (ref 275–301)
Potassium: 3.5 mmol/L (ref 3.5–5.1)
SGPT (ALT): 24 U/L (ref 12–78)
Sodium: 144 mmol/L (ref 136–145)
Total Protein: 6.9 g/dL (ref 6.4–8.2)

## 2013-04-03 LAB — CBC CANCER CENTER
Basophil #: 0.1 x10 3/mm (ref 0.0–0.1)
Eosinophil #: 0.2 x10 3/mm (ref 0.0–0.7)
Eosinophil %: 2.8 %
HGB: 10.3 g/dL — ABNORMAL LOW (ref 12.0–16.0)
Lymphocyte %: 29.4 %
MCH: 28.4 pg (ref 26.0–34.0)
Monocyte %: 7.1 %
Neutrophil #: 4.2 x10 3/mm (ref 1.4–6.5)
Platelet: 319 x10 3/mm (ref 150–440)
RBC: 3.64 10*6/uL — ABNORMAL LOW (ref 3.80–5.20)
RDW: 13.4 % (ref 11.5–14.5)
WBC: 7.1 x10 3/mm (ref 3.6–11.0)

## 2013-04-10 LAB — CBC CANCER CENTER
Basophil #: 0 x10 3/mm (ref 0.0–0.1)
Basophil %: 1.2 %
Eosinophil %: 3.3 %
HCT: 30.9 % — ABNORMAL LOW (ref 35.0–47.0)
HGB: 10.3 g/dL — ABNORMAL LOW (ref 12.0–16.0)
Lymphocyte %: 33.5 %
MCH: 28.3 pg (ref 26.0–34.0)
MCV: 85 fL (ref 80–100)
Monocyte #: 0.1 x10 3/mm — ABNORMAL LOW (ref 0.2–0.9)
Platelet: 271 x10 3/mm (ref 150–440)
RBC: 3.65 10*6/uL — ABNORMAL LOW (ref 3.80–5.20)
RDW: 13.4 % (ref 11.5–14.5)
WBC: 2.9 x10 3/mm — ABNORMAL LOW (ref 3.6–11.0)

## 2013-04-10 LAB — COMPREHENSIVE METABOLIC PANEL
Albumin: 3.6 g/dL (ref 3.4–5.0)
Alkaline Phosphatase: 136 U/L (ref 50–136)
Anion Gap: 3 — ABNORMAL LOW (ref 7–16)
BUN: 15 mg/dL (ref 7–18)
Bilirubin,Total: 0.6 mg/dL (ref 0.2–1.0)
Calcium, Total: 9.3 mg/dL (ref 8.5–10.1)
Co2: 31 mmol/L (ref 21–32)
Creatinine: 1.46 mg/dL — ABNORMAL HIGH (ref 0.60–1.30)
EGFR (African American): 44 — ABNORMAL LOW
EGFR (Non-African Amer.): 38 — ABNORMAL LOW
Glucose: 118 mg/dL — ABNORMAL HIGH (ref 65–99)
Potassium: 4.1 mmol/L (ref 3.5–5.1)
SGOT(AST): 31 U/L (ref 15–37)
Sodium: 136 mmol/L (ref 136–145)

## 2013-04-16 ENCOUNTER — Ambulatory Visit: Payer: Self-pay | Admitting: Oncology

## 2013-04-24 LAB — CBC CANCER CENTER
Basophil #: 0 x10 3/mm (ref 0.0–0.1)
Basophil %: 0.6 %
Eosinophil #: 0.2 x10 3/mm (ref 0.0–0.7)
Eosinophil %: 2.1 %
HCT: 27.3 % — ABNORMAL LOW (ref 35.0–47.0)
HGB: 9 g/dL — ABNORMAL LOW (ref 12.0–16.0)
Lymphocyte %: 23.5 %
MCH: 28.9 pg (ref 26.0–34.0)
MCHC: 32.9 g/dL (ref 32.0–36.0)
Monocyte #: 0.4 x10 3/mm (ref 0.2–0.9)
Monocyte %: 4.7 %
Neutrophil %: 69.1 %
Platelet: 243 x10 3/mm (ref 150–440)
RBC: 3.11 10*6/uL — ABNORMAL LOW (ref 3.80–5.20)
RDW: 14.2 % (ref 11.5–14.5)

## 2013-04-24 LAB — COMPREHENSIVE METABOLIC PANEL
Anion Gap: 10 (ref 7–16)
BUN: 15 mg/dL (ref 7–18)
Bilirubin,Total: 0.3 mg/dL (ref 0.2–1.0)
Chloride: 101 mmol/L (ref 98–107)
Co2: 29 mmol/L (ref 21–32)
Creatinine: 1.44 mg/dL — ABNORMAL HIGH (ref 0.60–1.30)
EGFR (Non-African Amer.): 39 — ABNORMAL LOW
Glucose: 104 mg/dL — ABNORMAL HIGH (ref 65–99)
Potassium: 4.6 mmol/L (ref 3.5–5.1)
SGOT(AST): 19 U/L (ref 15–37)
Total Protein: 7.2 g/dL (ref 6.4–8.2)

## 2013-05-02 LAB — CBC CANCER CENTER
Basophil #: 0 x10 3/mm (ref 0.0–0.1)
Eosinophil #: 0.1 x10 3/mm (ref 0.0–0.7)
HGB: 9.4 g/dL — ABNORMAL LOW (ref 12.0–16.0)
Lymphocyte #: 1.3 x10 3/mm (ref 1.0–3.6)
Lymphocyte %: 54.1 %
MCH: 29.3 pg (ref 26.0–34.0)
MCHC: 34.4 g/dL (ref 32.0–36.0)
MCV: 85 fL (ref 80–100)
Monocyte #: 0.3 x10 3/mm (ref 0.2–0.9)
Monocyte %: 11.7 %
Neutrophil #: 0.7 x10 3/mm — ABNORMAL LOW (ref 1.4–6.5)
Platelet: 268 x10 3/mm (ref 150–440)
RBC: 3.19 10*6/uL — ABNORMAL LOW (ref 3.80–5.20)
WBC: 2.4 x10 3/mm — ABNORMAL LOW (ref 3.6–11.0)

## 2013-05-11 ENCOUNTER — Emergency Department: Payer: Self-pay | Admitting: Internal Medicine

## 2013-05-15 LAB — COMPREHENSIVE METABOLIC PANEL
BUN: 23 mg/dL — ABNORMAL HIGH (ref 7–18)
Bilirubin,Total: 0.4 mg/dL (ref 0.2–1.0)
Calcium, Total: 8.8 mg/dL (ref 8.5–10.1)
Chloride: 104 mmol/L (ref 98–107)
Creatinine: 1.66 mg/dL — ABNORMAL HIGH (ref 0.60–1.30)
SGPT (ALT): 21 U/L (ref 12–78)
Sodium: 140 mmol/L (ref 136–145)
Total Protein: 7.4 g/dL (ref 6.4–8.2)

## 2013-05-15 LAB — CBC CANCER CENTER
Basophil #: 0.1 x10 3/mm (ref 0.0–0.1)
Eosinophil #: 0.1 x10 3/mm (ref 0.0–0.7)
Eosinophil %: 1 %
HCT: 26.3 % — ABNORMAL LOW (ref 35.0–47.0)
HGB: 8.6 g/dL — ABNORMAL LOW (ref 12.0–16.0)
MCH: 29.3 pg (ref 26.0–34.0)
MCHC: 32.9 g/dL (ref 32.0–36.0)
MCV: 89 fL (ref 80–100)
Neutrophil #: 4.8 x10 3/mm (ref 1.4–6.5)
Neutrophil %: 69.4 %
Platelet: 184 x10 3/mm (ref 150–440)
RBC: 2.95 10*6/uL — ABNORMAL LOW (ref 3.80–5.20)
RDW: 17.6 % — ABNORMAL HIGH (ref 11.5–14.5)
WBC: 7 x10 3/mm (ref 3.6–11.0)

## 2013-05-17 ENCOUNTER — Ambulatory Visit: Payer: Self-pay | Admitting: Oncology

## 2013-05-22 LAB — CBC CANCER CENTER
Eosinophil %: 2.6 %
HGB: 9.1 g/dL — ABNORMAL LOW (ref 12.0–16.0)
Lymphocyte #: 1.4 x10 3/mm (ref 1.0–3.6)
MCH: 29.6 pg (ref 26.0–34.0)
MCHC: 32.9 g/dL (ref 32.0–36.0)
Neutrophil #: 3.4 x10 3/mm (ref 1.4–6.5)
Neutrophil %: 64.3 %
Platelet: 338 x10 3/mm (ref 150–440)
RBC: 3.07 10*6/uL — ABNORMAL LOW (ref 3.80–5.20)
WBC: 5.3 x10 3/mm (ref 3.6–11.0)

## 2013-05-22 LAB — COMPREHENSIVE METABOLIC PANEL
Albumin: 3.5 g/dL (ref 3.4–5.0)
Alkaline Phosphatase: 145 U/L — ABNORMAL HIGH (ref 50–136)
Chloride: 100 mmol/L (ref 98–107)
EGFR (African American): 39 — ABNORMAL LOW
EGFR (Non-African Amer.): 33 — ABNORMAL LOW
Glucose: 109 mg/dL — ABNORMAL HIGH (ref 65–99)
SGOT(AST): 15 U/L (ref 15–37)
SGPT (ALT): 18 U/L (ref 12–78)

## 2013-05-29 LAB — BASIC METABOLIC PANEL
BUN: 18 mg/dL (ref 7–18)
Calcium, Total: 9.5 mg/dL (ref 8.5–10.1)
Chloride: 95 mmol/L — ABNORMAL LOW (ref 98–107)
Co2: 28 mmol/L (ref 21–32)
Creatinine: 1.84 mg/dL — ABNORMAL HIGH (ref 0.60–1.30)
Glucose: 113 mg/dL — ABNORMAL HIGH (ref 65–99)
Osmolality: 271 (ref 275–301)

## 2013-05-29 LAB — CBC CANCER CENTER
Basophil #: 0 x10 3/mm (ref 0.0–0.1)
HCT: 27.5 % — ABNORMAL LOW (ref 35.0–47.0)
HGB: 9.1 g/dL — ABNORMAL LOW (ref 12.0–16.0)
Lymphocyte #: 1.1 x10 3/mm (ref 1.0–3.6)
Lymphocyte %: 49.2 %
MCH: 29.8 pg (ref 26.0–34.0)
RDW: 19.3 % — ABNORMAL HIGH (ref 11.5–14.5)

## 2013-06-13 LAB — CBC CANCER CENTER
Basophil #: 0 x10 3/mm (ref 0.0–0.1)
Basophil %: 0.4 %
Eosinophil #: 0 x10 3/mm (ref 0.0–0.7)
Eosinophil %: 0.7 %
HCT: 25.6 % — ABNORMAL LOW (ref 35.0–47.0)
HGB: 8.6 g/dL — ABNORMAL LOW (ref 12.0–16.0)
Lymphocyte %: 19.9 %
MCHC: 33.5 g/dL (ref 32.0–36.0)
MCV: 94 fL (ref 80–100)
Neutrophil %: 73.6 %
Platelet: 132 x10 3/mm — ABNORMAL LOW (ref 150–440)
RBC: 2.74 10*6/uL — ABNORMAL LOW (ref 3.80–5.20)
RDW: 20.9 % — ABNORMAL HIGH (ref 11.5–14.5)
WBC: 6.7 x10 3/mm (ref 3.6–11.0)

## 2013-06-13 LAB — COMPREHENSIVE METABOLIC PANEL
Anion Gap: 9 (ref 7–16)
BUN: 17 mg/dL (ref 7–18)
Calcium, Total: 9.6 mg/dL (ref 8.5–10.1)
Chloride: 101 mmol/L (ref 98–107)
Co2: 28 mmol/L (ref 21–32)
Creatinine: 1.67 mg/dL — ABNORMAL HIGH (ref 0.60–1.30)
EGFR (African American): 37 — ABNORMAL LOW
EGFR (Non-African Amer.): 32 — ABNORMAL LOW
Glucose: 93 mg/dL (ref 65–99)
Potassium: 4.1 mmol/L (ref 3.5–5.1)
SGOT(AST): 16 U/L (ref 15–37)
SGPT (ALT): 21 U/L (ref 12–78)
Sodium: 138 mmol/L (ref 136–145)
Total Protein: 7.5 g/dL (ref 6.4–8.2)

## 2013-06-16 ENCOUNTER — Ambulatory Visit: Payer: Self-pay | Admitting: Oncology

## 2013-06-20 LAB — CBC CANCER CENTER
Basophil #: 0 x10 3/mm (ref 0.0–0.1)
Eosinophil #: 0 x10 3/mm (ref 0.0–0.7)
Eosinophil %: 1.1 %
HCT: 27 % — ABNORMAL LOW (ref 35.0–47.0)
HGB: 9.2 g/dL — ABNORMAL LOW (ref 12.0–16.0)
Lymphocyte #: 0.8 x10 3/mm — ABNORMAL LOW (ref 1.0–3.6)
Lymphocyte %: 45.7 %
MCH: 31.7 pg (ref 26.0–34.0)
MCHC: 34 g/dL (ref 32.0–36.0)
MCV: 93 fL (ref 80–100)
Monocyte #: 0.1 x10 3/mm — ABNORMAL LOW (ref 0.2–0.9)
Monocyte %: 5.3 %
Neutrophil %: 46.7 %
RBC: 2.89 10*6/uL — ABNORMAL LOW (ref 3.80–5.20)
RDW: 19.4 % — ABNORMAL HIGH (ref 11.5–14.5)
WBC: 1.8 x10 3/mm — CL (ref 3.6–11.0)

## 2013-06-20 LAB — BASIC METABOLIC PANEL
BUN: 28 mg/dL — ABNORMAL HIGH (ref 7–18)
Calcium, Total: 9.5 mg/dL (ref 8.5–10.1)
Co2: 28 mmol/L (ref 21–32)
EGFR (African American): 26 — ABNORMAL LOW
EGFR (Non-African Amer.): 22 — ABNORMAL LOW
Glucose: 146 mg/dL — ABNORMAL HIGH (ref 65–99)
Osmolality: 273 (ref 275–301)
Sodium: 132 mmol/L — ABNORMAL LOW (ref 136–145)

## 2013-06-20 LAB — MAGNESIUM: Magnesium: 1.6 mg/dL — ABNORMAL LOW

## 2013-07-04 LAB — CBC CANCER CENTER
Basophil #: 0 x10 3/mm (ref 0.0–0.1)
Basophil %: 0.5 %
Eosinophil #: 0 x10 3/mm (ref 0.0–0.7)
Eosinophil %: 0.4 %
HCT: 23.5 % — ABNORMAL LOW (ref 35.0–47.0)
MCV: 95 fL (ref 80–100)
Monocyte #: 0.3 x10 3/mm (ref 0.2–0.9)
RBC: 2.48 10*6/uL — ABNORMAL LOW (ref 3.80–5.20)
RDW: 19.2 % — ABNORMAL HIGH (ref 11.5–14.5)
WBC: 4.6 x10 3/mm (ref 3.6–11.0)

## 2013-07-04 LAB — COMPREHENSIVE METABOLIC PANEL
Albumin: 3.4 g/dL (ref 3.4–5.0)
Anion Gap: 10 (ref 7–16)
BUN: 6 mg/dL — ABNORMAL LOW (ref 7–18)
Chloride: 98 mmol/L (ref 98–107)
Co2: 26 mmol/L (ref 21–32)
Creatinine: 1.29 mg/dL (ref 0.60–1.30)
EGFR (African American): 51 — ABNORMAL LOW
EGFR (Non-African Amer.): 44 — ABNORMAL LOW
Osmolality: 266 (ref 275–301)

## 2013-07-17 ENCOUNTER — Ambulatory Visit: Payer: Self-pay | Admitting: Oncology

## 2013-07-21 LAB — RAPID INFLUENZA A&B ANTIGENS (ARMC ONLY)

## 2013-07-25 LAB — CBC CANCER CENTER
BASOS ABS: 0 x10 3/mm (ref 0.0–0.1)
BASOS PCT: 0.5 %
EOS ABS: 0 x10 3/mm (ref 0.0–0.7)
Eosinophil %: 0.2 %
HCT: 23.3 % — AB (ref 35.0–47.0)
HGB: 8.1 g/dL — ABNORMAL LOW (ref 12.0–16.0)
LYMPHS ABS: 1.3 x10 3/mm (ref 1.0–3.6)
LYMPHS PCT: 26.5 %
MCH: 33.3 pg (ref 26.0–34.0)
MCHC: 34.9 g/dL (ref 32.0–36.0)
MCV: 95 fL (ref 80–100)
MONO ABS: 0.3 x10 3/mm (ref 0.2–0.9)
Monocyte %: 6.6 %
NEUTROS ABS: 3.2 x10 3/mm (ref 1.4–6.5)
Neutrophil %: 66.2 %
PLATELETS: 132 x10 3/mm — AB (ref 150–440)
RBC: 2.44 10*6/uL — AB (ref 3.80–5.20)
RDW: 18.1 % — ABNORMAL HIGH (ref 11.5–14.5)
WBC: 4.9 x10 3/mm (ref 3.6–11.0)

## 2013-07-25 LAB — COMPREHENSIVE METABOLIC PANEL
ALBUMIN: 3.8 g/dL (ref 3.4–5.0)
ALK PHOS: 117 U/L
ALT: 18 U/L (ref 12–78)
Anion Gap: 10 (ref 7–16)
BUN: 10 mg/dL (ref 7–18)
Bilirubin,Total: 0.4 mg/dL (ref 0.2–1.0)
CALCIUM: 9.3 mg/dL (ref 8.5–10.1)
CO2: 27 mmol/L (ref 21–32)
CREATININE: 1.43 mg/dL — AB (ref 0.60–1.30)
Chloride: 92 mmol/L — ABNORMAL LOW (ref 98–107)
GFR CALC AF AMER: 45 — AB
GFR CALC NON AF AMER: 39 — AB
GLUCOSE: 108 mg/dL — AB (ref 65–99)
Osmolality: 259 (ref 275–301)
POTASSIUM: 3.6 mmol/L (ref 3.5–5.1)
SGOT(AST): 16 U/L (ref 15–37)
Sodium: 129 mmol/L — ABNORMAL LOW (ref 136–145)
Total Protein: 7.4 g/dL (ref 6.4–8.2)

## 2013-07-28 ENCOUNTER — Inpatient Hospital Stay: Payer: Self-pay | Admitting: Oncology

## 2013-07-28 LAB — COMPREHENSIVE METABOLIC PANEL
ALBUMIN: 3.5 g/dL (ref 3.4–5.0)
ALT: 22 U/L (ref 12–78)
AST: 23 U/L (ref 15–37)
Alkaline Phosphatase: 111 U/L
Anion Gap: 12 (ref 7–16)
BUN: 13 mg/dL (ref 7–18)
Bilirubin,Total: 0.5 mg/dL (ref 0.2–1.0)
CHLORIDE: 94 mmol/L — AB (ref 98–107)
CO2: 24 mmol/L (ref 21–32)
CREATININE: 1.33 mg/dL — AB (ref 0.60–1.30)
Calcium, Total: 9.5 mg/dL (ref 8.5–10.1)
EGFR (African American): 49 — ABNORMAL LOW
EGFR (Non-African Amer.): 42 — ABNORMAL LOW
GLUCOSE: 123 mg/dL — AB (ref 65–99)
Osmolality: 262 (ref 275–301)
Potassium: 4 mmol/L (ref 3.5–5.1)
Sodium: 130 mmol/L — ABNORMAL LOW (ref 136–145)
TOTAL PROTEIN: 7.2 g/dL (ref 6.4–8.2)

## 2013-07-28 LAB — URINALYSIS, COMPLETE
BACTERIA: NONE SEEN
Bilirubin,UR: NEGATIVE
GLUCOSE, UR: NEGATIVE mg/dL (ref 0–75)
KETONE: NEGATIVE
Leukocyte Esterase: NEGATIVE
Nitrite: NEGATIVE
PH: 5 (ref 4.5–8.0)
PROTEIN: NEGATIVE
RBC,UR: NONE SEEN /HPF (ref 0–5)
Specific Gravity: 1.004 (ref 1.003–1.030)
Squamous Epithelial: 1

## 2013-07-28 LAB — CBC CANCER CENTER
BASOS ABS: 0 x10 3/mm (ref 0.0–0.1)
BASOS PCT: 0.5 %
EOS PCT: 0.4 %
Eosinophil #: 0 x10 3/mm (ref 0.0–0.7)
HCT: 23.5 % — ABNORMAL LOW (ref 35.0–47.0)
HGB: 8 g/dL — AB (ref 12.0–16.0)
LYMPHS ABS: 1.6 x10 3/mm (ref 1.0–3.6)
LYMPHS PCT: 29.7 %
MCH: 32.9 pg (ref 26.0–34.0)
MCHC: 33.9 g/dL (ref 32.0–36.0)
MCV: 97 fL (ref 80–100)
MONO ABS: 0.1 x10 3/mm — AB (ref 0.2–0.9)
Monocyte %: 1.5 %
NEUTROS ABS: 3.7 x10 3/mm (ref 1.4–6.5)
Neutrophil %: 67.9 %
PLATELETS: 136 x10 3/mm — AB (ref 150–440)
RBC: 2.42 10*6/uL — ABNORMAL LOW (ref 3.80–5.20)
RDW: 18.7 % — AB (ref 11.5–14.5)
WBC: 5.5 x10 3/mm (ref 3.6–11.0)

## 2013-07-29 LAB — CBC WITH DIFFERENTIAL/PLATELET
BASOS PCT: 0.3 %
Basophil #: 0 10*3/uL (ref 0.0–0.1)
EOS ABS: 0 10*3/uL (ref 0.0–0.7)
Eosinophil %: 1.2 %
HCT: 18.7 % — ABNORMAL LOW (ref 35.0–47.0)
HGB: 6.5 g/dL — ABNORMAL LOW (ref 12.0–16.0)
LYMPHS PCT: 43.2 %
Lymphocyte #: 1.1 10*3/uL (ref 1.0–3.6)
MCH: 33.2 pg (ref 26.0–34.0)
MCHC: 34.8 g/dL (ref 32.0–36.0)
MCV: 96 fL (ref 80–100)
MONOS PCT: 1.6 %
Monocyte #: 0 x10 3/mm — ABNORMAL LOW (ref 0.2–0.9)
NEUTROS ABS: 1.4 10*3/uL (ref 1.4–6.5)
Neutrophil %: 53.7 %
Platelet: 112 10*3/uL — ABNORMAL LOW (ref 150–440)
RBC: 1.95 10*6/uL — AB (ref 3.80–5.20)
RDW: 17.9 % — AB (ref 11.5–14.5)
WBC: 2.5 10*3/uL — ABNORMAL LOW (ref 3.6–11.0)

## 2013-07-29 LAB — BASIC METABOLIC PANEL
Anion Gap: 5 — ABNORMAL LOW (ref 7–16)
BUN: 10 mg/dL (ref 7–18)
Calcium, Total: 8.8 mg/dL (ref 8.5–10.1)
Chloride: 101 mmol/L (ref 98–107)
Co2: 26 mmol/L (ref 21–32)
Creatinine: 1.12 mg/dL (ref 0.60–1.30)
EGFR (African American): >60
EGFR (Non-African Amer.): 52 — ABNORMAL LOW
GLUCOSE: 86 mg/dL (ref 65–99)
Osmolality: 263 (ref 275–301)
POTASSIUM: 3.8 mmol/L (ref 3.5–5.1)
SODIUM: 132 mmol/L — AB (ref 136–145)

## 2013-07-30 LAB — CBC WITH DIFFERENTIAL/PLATELET
BASOS PCT: 0.5 %
Basophil #: 0 10*3/uL (ref 0.0–0.1)
EOS ABS: 0 10*3/uL (ref 0.0–0.7)
Eosinophil %: 1.3 %
HCT: 22 % — ABNORMAL LOW (ref 35.0–47.0)
HGB: 7.8 g/dL — AB (ref 12.0–16.0)
Lymphocyte #: 1.1 10*3/uL (ref 1.0–3.6)
Lymphocyte %: 32.4 %
MCH: 32.7 pg (ref 26.0–34.0)
MCHC: 35.4 g/dL (ref 32.0–36.0)
MCV: 92 fL (ref 80–100)
MONO ABS: 0 x10 3/mm — AB (ref 0.2–0.9)
MONOS PCT: 1.2 %
NEUTROS ABS: 2.2 10*3/uL (ref 1.4–6.5)
NEUTROS PCT: 64.6 %
PLATELETS: 103 10*3/uL — AB (ref 150–440)
RBC: 2.38 10*6/uL — AB (ref 3.80–5.20)
RDW: 19.3 % — ABNORMAL HIGH (ref 11.5–14.5)
WBC: 3.3 10*3/uL — ABNORMAL LOW (ref 3.6–11.0)

## 2013-07-30 LAB — BASIC METABOLIC PANEL
Anion Gap: 5 — ABNORMAL LOW (ref 7–16)
BUN: 9 mg/dL (ref 7–18)
CHLORIDE: 102 mmol/L (ref 98–107)
CO2: 25 mmol/L (ref 21–32)
Calcium, Total: 8.9 mg/dL (ref 8.5–10.1)
Creatinine: 1.06 mg/dL (ref 0.60–1.30)
EGFR (Non-African Amer.): 56 — ABNORMAL LOW
GLUCOSE: 94 mg/dL (ref 65–99)
OSMOLALITY: 263 (ref 275–301)
Potassium: 3.8 mmol/L (ref 3.5–5.1)
SODIUM: 132 mmol/L — AB (ref 136–145)

## 2013-07-31 LAB — CBC WITH DIFFERENTIAL/PLATELET
BASOS ABS: 0 10*3/uL (ref 0.0–0.1)
Basophil %: 1.5 %
Eosinophil #: 0 10*3/uL (ref 0.0–0.7)
Eosinophil %: 1.5 %
HCT: 22.5 % — ABNORMAL LOW (ref 35.0–47.0)
HGB: 7.8 g/dL — ABNORMAL LOW (ref 12.0–16.0)
LYMPHS PCT: 48.7 %
Lymphocyte #: 0.7 10*3/uL — ABNORMAL LOW (ref 1.0–3.6)
MCH: 32.8 pg (ref 26.0–34.0)
MCHC: 34.8 g/dL (ref 32.0–36.0)
MCV: 94 fL (ref 80–100)
MONOS PCT: 1.9 %
Monocyte #: 0 x10 3/mm — ABNORMAL LOW (ref 0.2–0.9)
Neutrophil #: 0.7 10*3/uL — ABNORMAL LOW (ref 1.4–6.5)
Neutrophil %: 46.4 %
PLATELETS: 100 10*3/uL — AB (ref 150–440)
RBC: 2.39 10*6/uL — AB (ref 3.80–5.20)
RDW: 19.1 % — AB (ref 11.5–14.5)
WBC: 1.5 10*3/uL — CL (ref 3.6–11.0)

## 2013-07-31 LAB — BASIC METABOLIC PANEL
ANION GAP: 5 — AB (ref 7–16)
BUN: 8 mg/dL (ref 7–18)
Calcium, Total: 9 mg/dL (ref 8.5–10.1)
Chloride: 100 mmol/L (ref 98–107)
Co2: 27 mmol/L (ref 21–32)
Creatinine: 1.45 mg/dL — ABNORMAL HIGH (ref 0.60–1.30)
EGFR (African American): 44 — ABNORMAL LOW
GFR CALC NON AF AMER: 38 — AB
GLUCOSE: 134 mg/dL — AB (ref 65–99)
OSMOLALITY: 265 (ref 275–301)
POTASSIUM: 3.3 mmol/L — AB (ref 3.5–5.1)
SODIUM: 132 mmol/L — AB (ref 136–145)

## 2013-08-01 LAB — CBC WITH DIFFERENTIAL/PLATELET
Basophil #: 0 x10 3/mm 3
Basophil %: 1.2 %
Eosinophil #: 0 x10 3/mm 3
Eosinophil %: 1.6 %
HCT: 19.8 % — ABNORMAL LOW
HGB: 6.8 g/dL — ABNORMAL LOW
Lymphocyte %: 72.9 %
Lymphs Abs: 0.8 x10 3/mm 3 — ABNORMAL LOW
MCH: 32.1 pg
MCHC: 34.3 g/dL
MCV: 94 fL
Monocyte #: 0 "x10 3/mm " — ABNORMAL LOW
Monocyte %: 4.4 %
Neutrophil #: 0.2 x10 3/mm 3 — ABNORMAL LOW
Neutrophil %: 19.9 %
Platelet: 94 x10 3/mm 3 — ABNORMAL LOW
RBC: 2.12 X10 6/mm 3 — ABNORMAL LOW
RDW: 18.8 % — ABNORMAL HIGH
WBC: 1.1 x10 3/mm 3 — CL

## 2013-08-01 LAB — BASIC METABOLIC PANEL
ANION GAP: 7 (ref 7–16)
BUN: 7 mg/dL (ref 7–18)
CALCIUM: 8.3 mg/dL — AB (ref 8.5–10.1)
Chloride: 102 mmol/L (ref 98–107)
Co2: 26 mmol/L (ref 21–32)
Creatinine: 1.2 mg/dL (ref 0.60–1.30)
EGFR (African American): 56 — ABNORMAL LOW
EGFR (Non-African Amer.): 48 — ABNORMAL LOW
Glucose: 86 mg/dL (ref 65–99)
Osmolality: 267 (ref 275–301)
POTASSIUM: 3.5 mmol/L (ref 3.5–5.1)
Sodium: 135 mmol/L — ABNORMAL LOW (ref 136–145)

## 2013-08-02 LAB — CBC WITH DIFFERENTIAL/PLATELET
BASOS ABS: 0 10*3/uL (ref 0.0–0.1)
Basophil %: 0.8 %
EOS PCT: 1.6 %
Eosinophil #: 0 10*3/uL (ref 0.0–0.7)
HCT: 23.8 % — ABNORMAL LOW (ref 35.0–47.0)
HGB: 8.3 g/dL — AB (ref 12.0–16.0)
LYMPHS PCT: 65.3 %
Lymphocyte #: 0.9 10*3/uL — ABNORMAL LOW (ref 1.0–3.6)
MCH: 31.8 pg (ref 26.0–34.0)
MCHC: 35 g/dL (ref 32.0–36.0)
MCV: 91 fL (ref 80–100)
MONO ABS: 0.1 x10 3/mm — AB (ref 0.2–0.9)
Monocyte %: 10.1 %
Neutrophil #: 0.3 10*3/uL — ABNORMAL LOW (ref 1.4–6.5)
Neutrophil %: 22.2 %
Platelet: 91 10*3/uL — ABNORMAL LOW (ref 150–440)
RBC: 2.62 10*6/uL — AB (ref 3.80–5.20)
RDW: 19.3 % — ABNORMAL HIGH (ref 11.5–14.5)
WBC: 1.4 10*3/uL — AB (ref 3.6–11.0)

## 2013-08-02 LAB — COMPREHENSIVE METABOLIC PANEL
ANION GAP: 5 — AB (ref 7–16)
AST: 17 U/L (ref 15–37)
Albumin: 3 g/dL — ABNORMAL LOW (ref 3.4–5.0)
Alkaline Phosphatase: 90 U/L
BUN: 6 mg/dL — ABNORMAL LOW (ref 7–18)
Bilirubin,Total: 0.5 mg/dL (ref 0.2–1.0)
CALCIUM: 8.7 mg/dL (ref 8.5–10.1)
CREATININE: 1.06 mg/dL (ref 0.60–1.30)
Chloride: 104 mmol/L (ref 98–107)
Co2: 29 mmol/L (ref 21–32)
EGFR (African American): >60
GFR CALC NON AF AMER: 56 — AB
Glucose: 85 mg/dL (ref 65–99)
Osmolality: 273 (ref 275–301)
POTASSIUM: 3.7 mmol/L (ref 3.5–5.1)
SGPT (ALT): 26 U/L (ref 12–78)
Sodium: 138 mmol/L (ref 136–145)
TOTAL PROTEIN: 6.4 g/dL (ref 6.4–8.2)

## 2013-08-02 LAB — CULTURE, BLOOD (SINGLE)

## 2013-08-03 LAB — CBC WITH DIFFERENTIAL/PLATELET
Basophil #: 0 10*3/uL (ref 0.0–0.1)
Basophil %: 0.5 %
EOS ABS: 0 10*3/uL (ref 0.0–0.7)
Eosinophil %: 0.8 %
HCT: 23 % — ABNORMAL LOW (ref 35.0–47.0)
HGB: 8.1 g/dL — ABNORMAL LOW (ref 12.0–16.0)
LYMPHS PCT: 63.8 %
Lymphocyte #: 0.9 10*3/uL — ABNORMAL LOW (ref 1.0–3.6)
MCH: 32.3 pg (ref 26.0–34.0)
MCHC: 35.3 g/dL (ref 32.0–36.0)
MCV: 91 fL (ref 80–100)
Monocyte #: 0.3 x10 3/mm (ref 0.2–0.9)
Monocyte %: 18 %
Neutrophil #: 0.3 10*3/uL — ABNORMAL LOW (ref 1.4–6.5)
Neutrophil %: 16.9 %
PLATELETS: 72 10*3/uL — AB (ref 150–440)
RBC: 2.52 10*6/uL — AB (ref 3.80–5.20)
RDW: 18.8 % — AB (ref 11.5–14.5)
WBC: 1.5 10*3/uL — AB (ref 3.6–11.0)

## 2013-08-03 LAB — PROTIME-INR
INR: 1
Prothrombin Time: 12.6 secs (ref 11.5–14.7)

## 2013-08-03 LAB — CREATININE, SERUM
CREATININE: 1.31 mg/dL — AB (ref 0.60–1.30)
GFR CALC AF AMER: 50 — AB
GFR CALC NON AF AMER: 43 — AB

## 2013-08-03 LAB — POTASSIUM: Potassium: 3.2 mmol/L — ABNORMAL LOW (ref 3.5–5.1)

## 2013-08-04 LAB — BASIC METABOLIC PANEL
Anion Gap: 4 — ABNORMAL LOW (ref 7–16)
BUN: 7 mg/dL (ref 7–18)
CREATININE: 1.22 mg/dL (ref 0.60–1.30)
Calcium, Total: 8.2 mg/dL — ABNORMAL LOW (ref 8.5–10.1)
Chloride: 99 mmol/L (ref 98–107)
Co2: 29 mmol/L (ref 21–32)
EGFR (African American): 55 — ABNORMAL LOW
EGFR (Non-African Amer.): 47 — ABNORMAL LOW
Glucose: 87 mg/dL (ref 65–99)
Osmolality: 262 (ref 275–301)
Potassium: 3.5 mmol/L (ref 3.5–5.1)
Sodium: 132 mmol/L — ABNORMAL LOW (ref 136–145)

## 2013-08-04 LAB — CBC WITH DIFFERENTIAL/PLATELET
Basophil #: 0 10*3/uL (ref 0.0–0.1)
Basophil %: 0.4 %
EOS ABS: 0 10*3/uL (ref 0.0–0.7)
Eosinophil %: 0.6 %
HCT: 22.2 % — ABNORMAL LOW (ref 35.0–47.0)
HGB: 7.7 g/dL — AB (ref 12.0–16.0)
Lymphocyte #: 1.1 10*3/uL (ref 1.0–3.6)
Lymphocyte %: 48 %
MCH: 32.2 pg (ref 26.0–34.0)
MCHC: 34.6 g/dL (ref 32.0–36.0)
MCV: 93 fL (ref 80–100)
MONO ABS: 0.5 x10 3/mm (ref 0.2–0.9)
MONOS PCT: 24 %
NEUTROS PCT: 27 %
Neutrophil #: 0.6 10*3/uL — ABNORMAL LOW (ref 1.4–6.5)
Platelet: 63 10*3/uL — ABNORMAL LOW (ref 150–440)
RBC: 2.39 10*6/uL — ABNORMAL LOW (ref 3.80–5.20)
RDW: 18.5 % — AB (ref 11.5–14.5)
WBC: 2.2 10*3/uL — ABNORMAL LOW (ref 3.6–11.0)

## 2013-08-05 LAB — CREATININE, SERUM
Creatinine: 1.25 mg/dL (ref 0.60–1.30)
EGFR (Non-African Amer.): 46 — ABNORMAL LOW
GFR CALC AF AMER: 53 — AB

## 2013-08-12 ENCOUNTER — Ambulatory Visit: Payer: Self-pay | Admitting: Oncology

## 2013-08-15 LAB — COMPREHENSIVE METABOLIC PANEL
ALBUMIN: 3.6 g/dL (ref 3.4–5.0)
Alkaline Phosphatase: 127 U/L — ABNORMAL HIGH
Anion Gap: 9 (ref 7–16)
BILIRUBIN TOTAL: 0.5 mg/dL (ref 0.2–1.0)
BUN: 5 mg/dL — AB (ref 7–18)
CO2: 25 mmol/L (ref 21–32)
CREATININE: 1.05 mg/dL (ref 0.60–1.30)
Calcium, Total: 9.4 mg/dL (ref 8.5–10.1)
Chloride: 89 mmol/L — ABNORMAL LOW (ref 98–107)
EGFR (African American): >60
EGFR (Non-African Amer.): 56 — ABNORMAL LOW
Glucose: 98 mg/dL (ref 65–99)
OSMOLALITY: 245 (ref 275–301)
POTASSIUM: 3.7 mmol/L (ref 3.5–5.1)
SGOT(AST): 27 U/L (ref 15–37)
SGPT (ALT): 27 U/L (ref 12–78)
SODIUM: 123 mmol/L — AB (ref 136–145)
TOTAL PROTEIN: 7.3 g/dL (ref 6.4–8.2)

## 2013-08-15 LAB — CBC CANCER CENTER
Basophil #: 0 x10 3/mm (ref 0.0–0.1)
Basophil %: 0.6 %
Eosinophil #: 0.1 x10 3/mm (ref 0.0–0.7)
Eosinophil %: 2 %
HCT: 27.9 % — ABNORMAL LOW (ref 35.0–47.0)
HGB: 9.5 g/dL — ABNORMAL LOW (ref 12.0–16.0)
Lymphocyte #: 1.5 x10 3/mm (ref 1.0–3.6)
Lymphocyte %: 29.8 %
MCH: 31.6 pg (ref 26.0–34.0)
MCHC: 34.2 g/dL (ref 32.0–36.0)
MCV: 92 fL (ref 80–100)
Monocyte #: 0.4 x10 3/mm (ref 0.2–0.9)
Monocyte %: 7.1 %
Neutrophil #: 3 x10 3/mm (ref 1.4–6.5)
Neutrophil %: 60.5 %
Platelet: 91 x10 3/mm — ABNORMAL LOW (ref 150–440)
RBC: 3.02 10*6/uL — ABNORMAL LOW (ref 3.80–5.20)
RDW: 19.3 % — ABNORMAL HIGH (ref 11.5–14.5)
WBC: 5 x10 3/mm (ref 3.6–11.0)

## 2013-08-17 ENCOUNTER — Ambulatory Visit: Payer: Self-pay | Admitting: Oncology

## 2013-09-05 LAB — CBC CANCER CENTER
BASOS PCT: 0.5 %
Basophil #: 0 x10 3/mm (ref 0.0–0.1)
EOS PCT: 0.3 %
Eosinophil #: 0 x10 3/mm (ref 0.0–0.7)
HCT: 28.5 % — AB (ref 35.0–47.0)
HGB: 9.6 g/dL — AB (ref 12.0–16.0)
LYMPHS ABS: 1.2 x10 3/mm (ref 1.0–3.6)
Lymphocyte %: 13.3 %
MCH: 32 pg (ref 26.0–34.0)
MCHC: 33.7 g/dL (ref 32.0–36.0)
MCV: 95 fL (ref 80–100)
MONO ABS: 0.5 x10 3/mm (ref 0.2–0.9)
Monocyte %: 5.8 %
Neutrophil #: 7.2 x10 3/mm — ABNORMAL HIGH (ref 1.4–6.5)
Neutrophil %: 80.1 %
Platelet: 329 x10 3/mm (ref 150–440)
RBC: 3 10*6/uL — AB (ref 3.80–5.20)
RDW: 21.3 % — ABNORMAL HIGH (ref 11.5–14.5)
WBC: 8.9 x10 3/mm (ref 3.6–11.0)

## 2013-09-05 LAB — BASIC METABOLIC PANEL
Anion Gap: 13 (ref 7–16)
BUN: 8 mg/dL (ref 7–18)
Calcium, Total: 8.8 mg/dL (ref 8.5–10.1)
Chloride: 95 mmol/L — ABNORMAL LOW (ref 98–107)
Co2: 25 mmol/L (ref 21–32)
Creatinine: 1.19 mg/dL (ref 0.60–1.30)
GFR CALC AF AMER: 56 — AB
GFR CALC NON AF AMER: 49 — AB
GLUCOSE: 125 mg/dL — AB (ref 65–99)
Osmolality: 266 (ref 275–301)
Potassium: 3.4 mmol/L — ABNORMAL LOW (ref 3.5–5.1)
Sodium: 133 mmol/L — ABNORMAL LOW (ref 136–145)

## 2013-09-05 LAB — MAGNESIUM: Magnesium: 1.1 mg/dL — ABNORMAL LOW

## 2013-09-14 ENCOUNTER — Ambulatory Visit: Payer: Self-pay | Admitting: Oncology

## 2013-09-26 LAB — BASIC METABOLIC PANEL
Anion Gap: 11 (ref 7–16)
BUN: 10 mg/dL (ref 7–18)
Calcium, Total: 9.4 mg/dL (ref 8.5–10.1)
Chloride: 96 mmol/L — ABNORMAL LOW (ref 98–107)
Co2: 25 mmol/L (ref 21–32)
Creatinine: 1.35 mg/dL — ABNORMAL HIGH (ref 0.60–1.30)
EGFR (African American): 48 — ABNORMAL LOW
GFR CALC NON AF AMER: 42 — AB
Glucose: 101 mg/dL — ABNORMAL HIGH (ref 65–99)
Osmolality: 264 (ref 275–301)
POTASSIUM: 3.5 mmol/L (ref 3.5–5.1)
Sodium: 132 mmol/L — ABNORMAL LOW (ref 136–145)

## 2013-09-26 LAB — CBC CANCER CENTER
Basophil #: 0.1 x10 3/mm (ref 0.0–0.1)
Basophil %: 0.8 %
EOS PCT: 1.9 %
Eosinophil #: 0.2 x10 3/mm (ref 0.0–0.7)
HCT: 29.1 % — ABNORMAL LOW (ref 35.0–47.0)
HGB: 9.9 g/dL — ABNORMAL LOW (ref 12.0–16.0)
Lymphocyte #: 1.7 x10 3/mm (ref 1.0–3.6)
Lymphocyte %: 21.3 %
MCH: 32.3 pg (ref 26.0–34.0)
MCHC: 34 g/dL (ref 32.0–36.0)
MCV: 95 fL (ref 80–100)
MONO ABS: 0.6 x10 3/mm (ref 0.2–0.9)
Monocyte %: 7.9 %
NEUTROS PCT: 68.1 %
Neutrophil #: 5.5 x10 3/mm (ref 1.4–6.5)
Platelet: 268 x10 3/mm (ref 150–440)
RBC: 3.07 10*6/uL — ABNORMAL LOW (ref 3.80–5.20)
RDW: 17.9 % — AB (ref 11.5–14.5)
WBC: 8 x10 3/mm (ref 3.6–11.0)

## 2013-09-26 LAB — MAGNESIUM: Magnesium: 1.5 mg/dL — ABNORMAL LOW

## 2013-10-10 LAB — CBC CANCER CENTER
Basophil #: 0 x10 3/mm (ref 0.0–0.1)
Basophil %: 0.9 %
EOS PCT: 2.5 %
Eosinophil #: 0.1 x10 3/mm (ref 0.0–0.7)
HCT: 29.2 % — ABNORMAL LOW (ref 35.0–47.0)
HGB: 9.7 g/dL — ABNORMAL LOW (ref 12.0–16.0)
LYMPHS ABS: 1 x10 3/mm (ref 1.0–3.6)
Lymphocyte %: 21.2 %
MCH: 31.8 pg (ref 26.0–34.0)
MCHC: 33.3 g/dL (ref 32.0–36.0)
MCV: 96 fL (ref 80–100)
MONOS PCT: 5.4 %
Monocyte #: 0.3 x10 3/mm (ref 0.2–0.9)
Neutrophil #: 3.4 x10 3/mm (ref 1.4–6.5)
Neutrophil %: 70 %
Platelet: 243 x10 3/mm (ref 150–440)
RBC: 3.06 10*6/uL — AB (ref 3.80–5.20)
RDW: 15.6 % — ABNORMAL HIGH (ref 11.5–14.5)
WBC: 4.9 x10 3/mm (ref 3.6–11.0)

## 2013-10-15 ENCOUNTER — Ambulatory Visit: Payer: Self-pay | Admitting: Oncology

## 2013-10-17 LAB — CBC CANCER CENTER
Basophil #: 0 x10 3/mm (ref 0.0–0.1)
Basophil %: 0.5 %
EOS ABS: 0.1 x10 3/mm (ref 0.0–0.7)
Eosinophil %: 1.3 %
HCT: 29.8 % — ABNORMAL LOW (ref 35.0–47.0)
HGB: 9.8 g/dL — ABNORMAL LOW (ref 12.0–16.0)
LYMPHS ABS: 1.1 x10 3/mm (ref 1.0–3.6)
LYMPHS PCT: 18.1 %
MCH: 31.8 pg (ref 26.0–34.0)
MCHC: 32.9 g/dL (ref 32.0–36.0)
MCV: 97 fL (ref 80–100)
Monocyte #: 0.4 x10 3/mm (ref 0.2–0.9)
Monocyte %: 6.1 %
Neutrophil #: 4.7 x10 3/mm (ref 1.4–6.5)
Neutrophil %: 74 %
PLATELETS: 250 x10 3/mm (ref 150–440)
RBC: 3.07 10*6/uL — ABNORMAL LOW (ref 3.80–5.20)
RDW: 15.4 % — AB (ref 11.5–14.5)
WBC: 6.3 x10 3/mm (ref 3.6–11.0)

## 2013-10-17 LAB — BASIC METABOLIC PANEL
Anion Gap: 8 (ref 7–16)
BUN: 8 mg/dL (ref 7–18)
CALCIUM: 9.2 mg/dL (ref 8.5–10.1)
CHLORIDE: 102 mmol/L (ref 98–107)
CO2: 29 mmol/L (ref 21–32)
Creatinine: 1.28 mg/dL (ref 0.60–1.30)
EGFR (Non-African Amer.): 44 — ABNORMAL LOW
GFR CALC AF AMER: 52 — AB
Glucose: 102 mg/dL — ABNORMAL HIGH (ref 65–99)
OSMOLALITY: 276 (ref 275–301)
POTASSIUM: 3.6 mmol/L (ref 3.5–5.1)
SODIUM: 139 mmol/L (ref 136–145)

## 2013-10-17 LAB — MAGNESIUM: Magnesium: 1.5 mg/dL — ABNORMAL LOW

## 2013-10-24 LAB — CBC CANCER CENTER
Basophil #: 0 10*3/uL
Basophil %: 0.6 %
Eosinophil #: 0.1 10*3/uL
Eosinophil %: 1.4 %
HCT: 31.3 % — ABNORMAL LOW
HGB: 10.3 g/dL — ABNORMAL LOW
Lymphocyte %: 14.2 %
Lymphs Abs: 0.7 10*3/uL — ABNORMAL LOW
MCH: 31.7 pg
MCHC: 32.8 g/dL
MCV: 97 fL
Monocyte #: 0.3 10*3/uL
Monocyte %: 6.1 %
Neutrophil #: 3.9 10*3/uL
Neutrophil %: 77.7 %
Platelet: 259 10*3/uL
RBC: 3.24 10*6/uL — ABNORMAL LOW
RDW: 14.7 % — ABNORMAL HIGH
WBC: 5 10*3/uL

## 2013-10-31 LAB — CBC CANCER CENTER
Basophil #: 0 x10 3/mm (ref 0.0–0.1)
Basophil %: 0.9 %
EOS ABS: 0.1 x10 3/mm (ref 0.0–0.7)
Eosinophil %: 1.9 %
HCT: 32.3 % — AB (ref 35.0–47.0)
HGB: 10.7 g/dL — ABNORMAL LOW (ref 12.0–16.0)
Lymphocyte #: 0.7 x10 3/mm — ABNORMAL LOW (ref 1.0–3.6)
Lymphocyte %: 16.5 %
MCH: 31.9 pg (ref 26.0–34.0)
MCHC: 33.1 g/dL (ref 32.0–36.0)
MCV: 96 fL (ref 80–100)
Monocyte #: 0.3 x10 3/mm (ref 0.2–0.9)
Monocyte %: 6.4 %
Neutrophil #: 3.1 x10 3/mm (ref 1.4–6.5)
Neutrophil %: 74.3 %
Platelet: 238 x10 3/mm (ref 150–440)
RBC: 3.36 10*6/uL — ABNORMAL LOW (ref 3.80–5.20)
RDW: 14 % (ref 11.5–14.5)
WBC: 4.2 x10 3/mm (ref 3.6–11.0)

## 2013-11-07 LAB — BASIC METABOLIC PANEL
ANION GAP: 10 (ref 7–16)
BUN: 8 mg/dL (ref 7–18)
CHLORIDE: 97 mmol/L — AB (ref 98–107)
Calcium, Total: 9.6 mg/dL (ref 8.5–10.1)
Co2: 27 mmol/L (ref 21–32)
Creatinine: 1.22 mg/dL (ref 0.60–1.30)
EGFR (African American): 55 — ABNORMAL LOW
GFR CALC NON AF AMER: 47 — AB
GLUCOSE: 104 mg/dL — AB (ref 65–99)
OSMOLALITY: 267 (ref 275–301)
Potassium: 4.1 mmol/L (ref 3.5–5.1)
Sodium: 134 mmol/L — ABNORMAL LOW (ref 136–145)

## 2013-11-07 LAB — CBC CANCER CENTER
BASOS ABS: 0 x10 3/mm (ref 0.0–0.1)
Basophil %: 0.6 %
Eosinophil #: 0.1 x10 3/mm (ref 0.0–0.7)
Eosinophil %: 1.3 %
HCT: 30.6 % — ABNORMAL LOW (ref 35.0–47.0)
HGB: 10.4 g/dL — ABNORMAL LOW (ref 12.0–16.0)
Lymphocyte #: 0.6 x10 3/mm — ABNORMAL LOW (ref 1.0–3.6)
Lymphocyte %: 13.4 %
MCH: 32 pg (ref 26.0–34.0)
MCHC: 33.8 g/dL (ref 32.0–36.0)
MCV: 95 fL (ref 80–100)
Monocyte #: 0.3 x10 3/mm (ref 0.2–0.9)
Monocyte %: 7.6 %
NEUTROS ABS: 3.4 x10 3/mm (ref 1.4–6.5)
Neutrophil %: 77.1 %
Platelet: 216 x10 3/mm (ref 150–440)
RBC: 3.24 10*6/uL — AB (ref 3.80–5.20)
RDW: 13.5 % (ref 11.5–14.5)
WBC: 4.4 x10 3/mm (ref 3.6–11.0)

## 2013-11-07 LAB — MAGNESIUM: MAGNESIUM: 1.9 mg/dL

## 2013-11-14 ENCOUNTER — Ambulatory Visit: Payer: Self-pay | Admitting: Oncology

## 2013-11-14 LAB — CBC CANCER CENTER
BASOS PCT: 0.7 %
Basophil #: 0 x10 3/mm (ref 0.0–0.1)
EOS PCT: 1.7 %
Eosinophil #: 0.1 x10 3/mm (ref 0.0–0.7)
HCT: 30.6 % — ABNORMAL LOW (ref 35.0–47.0)
HGB: 10.7 g/dL — ABNORMAL LOW (ref 12.0–16.0)
LYMPHS ABS: 0.5 x10 3/mm — AB (ref 1.0–3.6)
Lymphocyte %: 11.8 %
MCH: 32.6 pg (ref 26.0–34.0)
MCHC: 35.1 g/dL (ref 32.0–36.0)
MCV: 93 fL (ref 80–100)
MONOS PCT: 7.8 %
Monocyte #: 0.3 x10 3/mm (ref 0.2–0.9)
Neutrophil #: 3.4 x10 3/mm (ref 1.4–6.5)
Neutrophil %: 78 %
Platelet: 204 x10 3/mm (ref 150–440)
RBC: 3.3 10*6/uL — ABNORMAL LOW (ref 3.80–5.20)
RDW: 13.2 % (ref 11.5–14.5)
WBC: 4.4 x10 3/mm (ref 3.6–11.0)

## 2013-11-21 LAB — CBC CANCER CENTER
BASOS ABS: 0 x10 3/mm (ref 0.0–0.1)
Basophil %: 0.9 %
EOS PCT: 2.1 %
Eosinophil #: 0.1 x10 3/mm (ref 0.0–0.7)
HCT: 30 % — ABNORMAL LOW (ref 35.0–47.0)
HGB: 10.3 g/dL — AB (ref 12.0–16.0)
LYMPHS ABS: 0.5 x10 3/mm — AB (ref 1.0–3.6)
Lymphocyte %: 15.4 %
MCH: 32.4 pg (ref 26.0–34.0)
MCHC: 34.5 g/dL (ref 32.0–36.0)
MCV: 94 fL (ref 80–100)
Monocyte #: 0.2 x10 3/mm (ref 0.2–0.9)
Monocyte %: 7.5 %
Neutrophil #: 2.2 x10 3/mm (ref 1.4–6.5)
Neutrophil %: 74.1 %
Platelet: 203 x10 3/mm (ref 150–440)
RBC: 3.18 10*6/uL — ABNORMAL LOW (ref 3.80–5.20)
RDW: 13 % (ref 11.5–14.5)
WBC: 3 x10 3/mm — ABNORMAL LOW (ref 3.6–11.0)

## 2013-12-01 LAB — CBC CANCER CENTER
BASOS ABS: 0 x10 3/mm (ref 0.0–0.1)
Basophil %: 1 %
EOS PCT: 1.8 %
Eosinophil #: 0.1 x10 3/mm (ref 0.0–0.7)
HCT: 28.6 % — ABNORMAL LOW (ref 35.0–47.0)
HGB: 10.2 g/dL — AB (ref 12.0–16.0)
Lymphocyte #: 0.6 x10 3/mm — ABNORMAL LOW (ref 1.0–3.6)
Lymphocyte %: 17 %
MCH: 32.7 pg (ref 26.0–34.0)
MCHC: 35.6 g/dL (ref 32.0–36.0)
MCV: 92 fL (ref 80–100)
Monocyte #: 0.3 x10 3/mm (ref 0.2–0.9)
Monocyte %: 7.3 %
NEUTROS ABS: 2.7 x10 3/mm (ref 1.4–6.5)
Neutrophil %: 72.9 %
Platelet: 203 x10 3/mm (ref 150–440)
RBC: 3.12 10*6/uL — ABNORMAL LOW (ref 3.80–5.20)
RDW: 13.3 % (ref 11.5–14.5)
WBC: 3.7 x10 3/mm (ref 3.6–11.0)

## 2013-12-01 LAB — BASIC METABOLIC PANEL
Anion Gap: 9 (ref 7–16)
BUN: 11 mg/dL (ref 7–18)
CHLORIDE: 98 mmol/L (ref 98–107)
CO2: 28 mmol/L (ref 21–32)
Calcium, Total: 9.2 mg/dL (ref 8.5–10.1)
Creatinine: 1.38 mg/dL — ABNORMAL HIGH (ref 0.60–1.30)
EGFR (African American): 47 — ABNORMAL LOW
GFR CALC NON AF AMER: 41 — AB
Glucose: 120 mg/dL — ABNORMAL HIGH (ref 65–99)
Osmolality: 271 (ref 275–301)
Potassium: 3.8 mmol/L (ref 3.5–5.1)
Sodium: 135 mmol/L — ABNORMAL LOW (ref 136–145)

## 2013-12-01 LAB — MAGNESIUM: MAGNESIUM: 1.8 mg/dL

## 2013-12-15 ENCOUNTER — Ambulatory Visit: Payer: Self-pay | Admitting: Oncology

## 2013-12-16 DIAGNOSIS — F419 Anxiety disorder, unspecified: Secondary | ICD-10-CM | POA: Insufficient documentation

## 2013-12-16 DIAGNOSIS — I9589 Other hypotension: Secondary | ICD-10-CM | POA: Insufficient documentation

## 2013-12-16 DIAGNOSIS — I2510 Atherosclerotic heart disease of native coronary artery without angina pectoris: Secondary | ICD-10-CM | POA: Insufficient documentation

## 2013-12-16 DIAGNOSIS — C50919 Malignant neoplasm of unspecified site of unspecified female breast: Secondary | ICD-10-CM | POA: Insufficient documentation

## 2013-12-16 DIAGNOSIS — E785 Hyperlipidemia, unspecified: Secondary | ICD-10-CM | POA: Insufficient documentation

## 2013-12-16 DIAGNOSIS — E039 Hypothyroidism, unspecified: Secondary | ICD-10-CM | POA: Diagnosis present

## 2013-12-16 DIAGNOSIS — R110 Nausea: Secondary | ICD-10-CM | POA: Insufficient documentation

## 2013-12-16 DIAGNOSIS — I10 Essential (primary) hypertension: Secondary | ICD-10-CM | POA: Insufficient documentation

## 2013-12-22 LAB — CBC CANCER CENTER
Basophil #: 0 x10 3/mm (ref 0.0–0.1)
Basophil %: 0.8 %
EOS ABS: 0.1 x10 3/mm (ref 0.0–0.7)
Eosinophil %: 1.8 %
HCT: 29.7 % — ABNORMAL LOW (ref 35.0–47.0)
HGB: 10.3 g/dL — ABNORMAL LOW (ref 12.0–16.0)
Lymphocyte #: 0.7 x10 3/mm — ABNORMAL LOW (ref 1.0–3.6)
Lymphocyte %: 19.1 %
MCH: 32.4 pg (ref 26.0–34.0)
MCHC: 34.7 g/dL (ref 32.0–36.0)
MCV: 93 fL (ref 80–100)
Monocyte #: 0.3 x10 3/mm (ref 0.2–0.9)
Monocyte %: 6.5 %
NEUTROS ABS: 2.8 x10 3/mm (ref 1.4–6.5)
Neutrophil %: 71.8 %
Platelet: 222 x10 3/mm (ref 150–440)
RBC: 3.18 10*6/uL — ABNORMAL LOW (ref 3.80–5.20)
RDW: 12.7 % (ref 11.5–14.5)
WBC: 3.9 x10 3/mm (ref 3.6–11.0)

## 2013-12-22 LAB — BASIC METABOLIC PANEL
Anion Gap: 9 (ref 7–16)
BUN: 11 mg/dL (ref 7–18)
CALCIUM: 9.7 mg/dL (ref 8.5–10.1)
Chloride: 97 mmol/L — ABNORMAL LOW (ref 98–107)
Co2: 28 mmol/L (ref 21–32)
Creatinine: 1.33 mg/dL — ABNORMAL HIGH (ref 0.60–1.30)
EGFR (African American): 49 — ABNORMAL LOW
EGFR (Non-African Amer.): 42 — ABNORMAL LOW
Glucose: 116 mg/dL — ABNORMAL HIGH (ref 65–99)
OSMOLALITY: 269 (ref 275–301)
Potassium: 3.6 mmol/L (ref 3.5–5.1)
Sodium: 134 mmol/L — ABNORMAL LOW (ref 136–145)

## 2013-12-25 ENCOUNTER — Ambulatory Visit: Payer: Self-pay | Admitting: Gastroenterology

## 2014-01-12 LAB — BASIC METABOLIC PANEL
ANION GAP: 5 — AB (ref 7–16)
BUN: 9 mg/dL (ref 7–18)
CALCIUM: 9.4 mg/dL (ref 8.5–10.1)
CO2: 29 mmol/L (ref 21–32)
Chloride: 102 mmol/L (ref 98–107)
Creatinine: 1.47 mg/dL — ABNORMAL HIGH (ref 0.60–1.30)
EGFR (African American): 44 — ABNORMAL LOW
GFR CALC NON AF AMER: 38 — AB
Glucose: 103 mg/dL — ABNORMAL HIGH (ref 65–99)
OSMOLALITY: 271 (ref 275–301)
Potassium: 3.5 mmol/L (ref 3.5–5.1)
SODIUM: 136 mmol/L (ref 136–145)

## 2014-01-12 LAB — CBC CANCER CENTER
Basophil #: 0 x10 3/mm (ref 0.0–0.1)
Basophil %: 0.8 %
Eosinophil #: 0.1 x10 3/mm (ref 0.0–0.7)
Eosinophil %: 2.4 %
HCT: 28.7 % — ABNORMAL LOW (ref 35.0–47.0)
HGB: 9.9 g/dL — ABNORMAL LOW (ref 12.0–16.0)
LYMPHS ABS: 0.8 x10 3/mm — AB (ref 1.0–3.6)
LYMPHS PCT: 21.2 %
MCH: 31.8 pg (ref 26.0–34.0)
MCHC: 34.5 g/dL (ref 32.0–36.0)
MCV: 92 fL (ref 80–100)
MONO ABS: 0.3 x10 3/mm (ref 0.2–0.9)
Monocyte %: 6.8 %
NEUTROS PCT: 68.8 %
Neutrophil #: 2.7 x10 3/mm (ref 1.4–6.5)
Platelet: 230 x10 3/mm (ref 150–440)
RBC: 3.12 10*6/uL — ABNORMAL LOW (ref 3.80–5.20)
RDW: 12.9 % (ref 11.5–14.5)
WBC: 3.9 x10 3/mm (ref 3.6–11.0)

## 2014-01-12 LAB — MAGNESIUM: Magnesium: 1.7 mg/dL — ABNORMAL LOW

## 2014-01-14 ENCOUNTER — Ambulatory Visit: Payer: Self-pay | Admitting: Oncology

## 2014-02-02 LAB — BASIC METABOLIC PANEL
ANION GAP: 11 (ref 7–16)
BUN: 9 mg/dL (ref 7–18)
CREATININE: 1.42 mg/dL — AB (ref 0.60–1.30)
Calcium, Total: 9.7 mg/dL (ref 8.5–10.1)
Chloride: 99 mmol/L (ref 98–107)
Co2: 25 mmol/L (ref 21–32)
EGFR (Non-African Amer.): 39 — ABNORMAL LOW
GFR CALC AF AMER: 45 — AB
GLUCOSE: 126 mg/dL — AB (ref 65–99)
OSMOLALITY: 270 (ref 275–301)
Potassium: 3.5 mmol/L (ref 3.5–5.1)
SODIUM: 135 mmol/L — AB (ref 136–145)

## 2014-02-02 LAB — CBC CANCER CENTER
Basophil #: 0 x10 3/mm (ref 0.0–0.1)
Basophil %: 0.7 %
EOS PCT: 1.2 %
Eosinophil #: 0.1 x10 3/mm (ref 0.0–0.7)
HCT: 30.2 % — ABNORMAL LOW (ref 35.0–47.0)
HGB: 10.2 g/dL — ABNORMAL LOW (ref 12.0–16.0)
LYMPHS ABS: 0.8 x10 3/mm — AB (ref 1.0–3.6)
Lymphocyte %: 15.9 %
MCH: 31.2 pg (ref 26.0–34.0)
MCHC: 33.7 g/dL (ref 32.0–36.0)
MCV: 93 fL (ref 80–100)
Monocyte #: 0.3 x10 3/mm (ref 0.2–0.9)
Monocyte %: 5.5 %
NEUTROS ABS: 3.8 x10 3/mm (ref 1.4–6.5)
NEUTROS PCT: 76.7 %
Platelet: 227 x10 3/mm (ref 150–440)
RBC: 3.26 10*6/uL — AB (ref 3.80–5.20)
RDW: 13 % (ref 11.5–14.5)
WBC: 4.9 x10 3/mm (ref 3.6–11.0)

## 2014-02-02 LAB — MAGNESIUM: Magnesium: 1.5 mg/dL — ABNORMAL LOW

## 2014-02-14 ENCOUNTER — Ambulatory Visit: Payer: Self-pay | Admitting: Oncology

## 2014-02-19 ENCOUNTER — Ambulatory Visit: Payer: Self-pay | Admitting: General Surgery

## 2014-02-23 LAB — BASIC METABOLIC PANEL
Anion Gap: 8 (ref 7–16)
BUN: 7 mg/dL (ref 7–18)
CALCIUM: 9 mg/dL (ref 8.5–10.1)
CO2: 28 mmol/L (ref 21–32)
Chloride: 100 mmol/L (ref 98–107)
Creatinine: 1.45 mg/dL — ABNORMAL HIGH (ref 0.60–1.30)
EGFR (African American): 44 — ABNORMAL LOW
EGFR (Non-African Amer.): 38 — ABNORMAL LOW
Glucose: 114 mg/dL — ABNORMAL HIGH (ref 65–99)
OSMOLALITY: 271 (ref 275–301)
Potassium: 3.5 mmol/L (ref 3.5–5.1)
Sodium: 136 mmol/L (ref 136–145)

## 2014-02-23 LAB — CBC CANCER CENTER
BASOS ABS: 0 x10 3/mm (ref 0.0–0.1)
Basophil %: 0.8 %
EOS ABS: 0.1 x10 3/mm (ref 0.0–0.7)
Eosinophil %: 1.5 %
HCT: 28.5 % — ABNORMAL LOW (ref 35.0–47.0)
HGB: 9.8 g/dL — AB (ref 12.0–16.0)
LYMPHS PCT: 14.9 %
Lymphocyte #: 0.8 x10 3/mm — ABNORMAL LOW (ref 1.0–3.6)
MCH: 31.2 pg (ref 26.0–34.0)
MCHC: 34.3 g/dL (ref 32.0–36.0)
MCV: 91 fL (ref 80–100)
Monocyte #: 0.2 x10 3/mm (ref 0.2–0.9)
Monocyte %: 4.4 %
Neutrophil #: 4.2 x10 3/mm (ref 1.4–6.5)
Neutrophil %: 78.4 %
PLATELETS: 228 x10 3/mm (ref 150–440)
RBC: 3.13 10*6/uL — ABNORMAL LOW (ref 3.80–5.20)
RDW: 13.1 % (ref 11.5–14.5)
WBC: 5.4 x10 3/mm (ref 3.6–11.0)

## 2014-02-23 LAB — HEPATIC FUNCTION PANEL A (ARMC)
ALK PHOS: 110 U/L
ALT: 23 U/L
Albumin: 3 g/dL — ABNORMAL LOW (ref 3.4–5.0)
Bilirubin,Total: 0.4 mg/dL (ref 0.2–1.0)
SGOT(AST): 17 U/L (ref 15–37)
TOTAL PROTEIN: 6.7 g/dL (ref 6.4–8.2)

## 2014-02-23 LAB — MAGNESIUM: MAGNESIUM: 1.7 mg/dL — AB

## 2014-02-26 ENCOUNTER — Emergency Department: Payer: Self-pay | Admitting: Emergency Medicine

## 2014-02-26 LAB — URINALYSIS, COMPLETE
Bacteria: NONE SEEN
Bilirubin,UR: NEGATIVE
Glucose,UR: NEGATIVE mg/dL (ref 0–75)
KETONE: NEGATIVE
Nitrite: NEGATIVE
PROTEIN: NEGATIVE
Ph: 6 (ref 4.5–8.0)
RBC,UR: 2 /HPF (ref 0–5)
Specific Gravity: 1.012 (ref 1.003–1.030)
WBC UR: 7 /HPF (ref 0–5)

## 2014-02-26 LAB — D-DIMER(ARMC): D-Dimer: 1051 ng/ml

## 2014-02-26 LAB — CBC WITH DIFFERENTIAL/PLATELET
Basophil #: 0 10*3/uL (ref 0.0–0.1)
Basophil %: 0.7 %
EOS PCT: 0.9 %
Eosinophil #: 0 10*3/uL (ref 0.0–0.7)
HCT: 30.5 % — ABNORMAL LOW (ref 35.0–47.0)
HGB: 9.8 g/dL — ABNORMAL LOW (ref 12.0–16.0)
Lymphocyte #: 0.9 10*3/uL — ABNORMAL LOW (ref 1.0–3.6)
Lymphocyte %: 17.2 %
MCH: 30.5 pg (ref 26.0–34.0)
MCHC: 32.2 g/dL (ref 32.0–36.0)
MCV: 95 fL (ref 80–100)
Monocyte #: 0.3 x10 3/mm (ref 0.2–0.9)
Monocyte %: 4.9 %
Neutrophil #: 4 10*3/uL (ref 1.4–6.5)
Neutrophil %: 76.3 %
Platelet: 225 10*3/uL (ref 150–440)
RBC: 3.22 10*6/uL — ABNORMAL LOW (ref 3.80–5.20)
RDW: 13 % (ref 11.5–14.5)
WBC: 5.3 10*3/uL (ref 3.6–11.0)

## 2014-02-26 LAB — BASIC METABOLIC PANEL
ANION GAP: 11 (ref 7–16)
BUN: 7 mg/dL (ref 7–18)
CALCIUM: 8.6 mg/dL (ref 8.5–10.1)
CHLORIDE: 104 mmol/L (ref 98–107)
CO2: 21 mmol/L (ref 21–32)
Creatinine: 1.49 mg/dL — ABNORMAL HIGH (ref 0.60–1.30)
EGFR (Non-African Amer.): 37 — ABNORMAL LOW
GFR CALC AF AMER: 43 — AB
GLUCOSE: 119 mg/dL — AB (ref 65–99)
Osmolality: 271 (ref 275–301)
POTASSIUM: 3.6 mmol/L (ref 3.5–5.1)
Sodium: 136 mmol/L (ref 136–145)

## 2014-02-26 LAB — TROPONIN I: Troponin-I: <0.02 ng/mL

## 2014-03-03 ENCOUNTER — Ambulatory Visit: Payer: Self-pay | Admitting: General Surgery

## 2014-03-17 ENCOUNTER — Ambulatory Visit: Payer: Self-pay | Admitting: Oncology

## 2014-03-20 LAB — COMPREHENSIVE METABOLIC PANEL
ALBUMIN: 3.4 g/dL (ref 3.4–5.0)
Alkaline Phosphatase: 110 U/L
Anion Gap: 6 — ABNORMAL LOW (ref 7–16)
BUN: 8 mg/dL (ref 7–18)
Bilirubin,Total: 0.4 mg/dL (ref 0.2–1.0)
CREATININE: 1.3 mg/dL (ref 0.60–1.30)
Calcium, Total: 9.6 mg/dL (ref 8.5–10.1)
Chloride: 101 mmol/L (ref 98–107)
Co2: 28 mmol/L (ref 21–32)
EGFR (African American): 50 — ABNORMAL LOW
EGFR (Non-African Amer.): 43 — ABNORMAL LOW
GLUCOSE: 112 mg/dL — AB (ref 65–99)
OSMOLALITY: 269 (ref 275–301)
POTASSIUM: 3.4 mmol/L — AB (ref 3.5–5.1)
SGOT(AST): 17 U/L (ref 15–37)
SGPT (ALT): 23 U/L
SODIUM: 135 mmol/L — AB (ref 136–145)
TOTAL PROTEIN: 6.9 g/dL (ref 6.4–8.2)

## 2014-03-20 LAB — CBC CANCER CENTER
Basophil #: 0 x10 3/mm (ref 0.0–0.1)
Basophil %: 0.9 %
EOS ABS: 0.1 x10 3/mm (ref 0.0–0.7)
Eosinophil %: 1.2 %
HCT: 31.3 % — AB (ref 35.0–47.0)
HGB: 10.3 g/dL — ABNORMAL LOW (ref 12.0–16.0)
Lymphocyte #: 1 x10 3/mm (ref 1.0–3.6)
Lymphocyte %: 22.6 %
MCH: 31 pg (ref 26.0–34.0)
MCHC: 33 g/dL (ref 32.0–36.0)
MCV: 94 fL (ref 80–100)
MONO ABS: 0.4 x10 3/mm (ref 0.2–0.9)
MONOS PCT: 8.2 %
Neutrophil #: 3 x10 3/mm (ref 1.4–6.5)
Neutrophil %: 67.1 %
PLATELETS: 247 x10 3/mm (ref 150–440)
RBC: 3.33 10*6/uL — ABNORMAL LOW (ref 3.80–5.20)
RDW: 14.6 % — ABNORMAL HIGH (ref 11.5–14.5)
WBC: 4.5 x10 3/mm (ref 3.6–11.0)

## 2014-03-24 ENCOUNTER — Ambulatory Visit: Payer: Self-pay | Admitting: Gastroenterology

## 2014-03-26 ENCOUNTER — Ambulatory Visit: Payer: Self-pay | Admitting: Gastroenterology

## 2014-03-26 LAB — PATHOLOGY REPORT

## 2014-04-16 ENCOUNTER — Encounter: Payer: Self-pay | Admitting: *Deleted

## 2014-04-16 ENCOUNTER — Ambulatory Visit: Payer: Self-pay | Admitting: Oncology

## 2014-04-24 LAB — CBC CANCER CENTER
Basophil #: 0.1 x10 3/mm (ref 0.0–0.1)
Basophil %: 0.8 %
EOS PCT: 0.9 %
Eosinophil #: 0.1 x10 3/mm (ref 0.0–0.7)
HCT: 30.4 % — ABNORMAL LOW (ref 35.0–47.0)
HGB: 10.2 g/dL — ABNORMAL LOW (ref 12.0–16.0)
LYMPHS PCT: 16 %
Lymphocyte #: 1 x10 3/mm (ref 1.0–3.6)
MCH: 31.3 pg (ref 26.0–34.0)
MCHC: 33.7 g/dL (ref 32.0–36.0)
MCV: 93 fL (ref 80–100)
Monocyte #: 0.5 x10 3/mm (ref 0.2–0.9)
Monocyte %: 7.5 %
NEUTROS PCT: 74.8 %
Neutrophil #: 4.9 x10 3/mm (ref 1.4–6.5)
Platelet: 284 x10 3/mm (ref 150–440)
RBC: 3.27 10*6/uL — ABNORMAL LOW (ref 3.80–5.20)
RDW: 13.5 % (ref 11.5–14.5)
WBC: 6.5 x10 3/mm (ref 3.6–11.0)

## 2014-05-17 ENCOUNTER — Ambulatory Visit: Payer: Self-pay | Admitting: Oncology

## 2014-05-18 ENCOUNTER — Encounter: Payer: Self-pay | Admitting: General Surgery

## 2014-05-26 ENCOUNTER — Ambulatory Visit: Payer: Self-pay | Admitting: Surgery

## 2014-06-04 ENCOUNTER — Ambulatory Visit: Payer: Self-pay | Admitting: Gastroenterology

## 2014-06-30 ENCOUNTER — Ambulatory Visit: Payer: Self-pay | Admitting: Oncology

## 2014-07-17 ENCOUNTER — Ambulatory Visit: Payer: Self-pay | Admitting: Oncology

## 2014-08-04 LAB — CANCER ANTIGEN 27.29: CA 27.29: 29.9 U/mL (ref 0.0–38.6)

## 2014-08-17 ENCOUNTER — Ambulatory Visit: Payer: Self-pay | Admitting: Oncology

## 2014-08-29 ENCOUNTER — Ambulatory Visit: Payer: Self-pay | Admitting: Neurology

## 2014-09-09 IMAGING — NM NUCLEAR MEDICINE CARDIAC MULTIPLE UPTAKE GATED ACQUISITION SCAN
4 series · 24 of 24 positions shown · non-contrast
Comparison: DG CHEST 2V dated 07/28/2013;

CLINICAL DATA: High risk medication.  Breast cancer

EXAM:
NUCLEAR MEDICINE CARDIAC BLOOD POOL IMAGING (MUGA)
TECHNIQUE: Cardiac multi-gated acquisition was performed at rest following
intravenous injection of 7c-HHm labeled red blood cells.

[Series 1000: ant-gated · 3.30mm/px · 6 of 24 frames shown]
[frame 3/24]
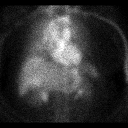
[frame 7/24]
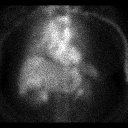
[frame 11/24]
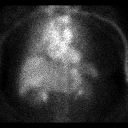
[frame 15/24]
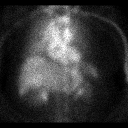
[frame 19/24]
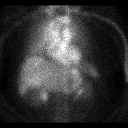
[frame 23/24]
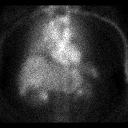

[Series 1000: lao 70-gated · 3.30mm/px · 6 of 24 frames shown]
[frame 3/24]
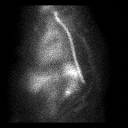
[frame 7/24]
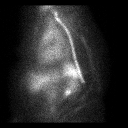
[frame 11/24]
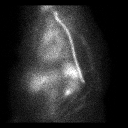
[frame 15/24]
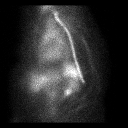
[frame 19/24]
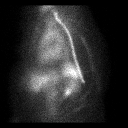
[frame 23/24]
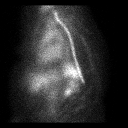

[Series 1000: lao 45-gated (results) · 3.30mm/px · 6 of 24 frames shown]
[frame 3/24  full-range]
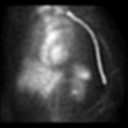
[frame 7/24  full-range]
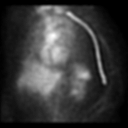
[frame 11/24  full-range]
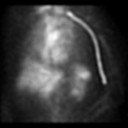
[frame 15/24  full-range]
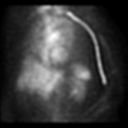
[frame 19/24  full-range]
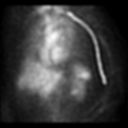
[frame 23/24  full-range]
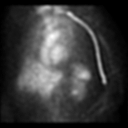

[Series 1000: lao 45-gated · 3.30mm/px · 6 of 24 frames shown]
[frame 3/24]
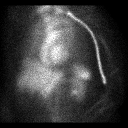
[frame 7/24]
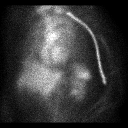
[frame 11/24]
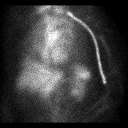
[frame 15/24]
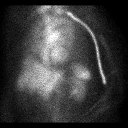
[frame 19/24]
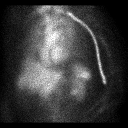
[frame 23/24]
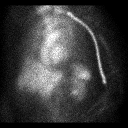

[24 of 24 positions shown; findings below may reference images not displayed]

NM PET JZ BORBA (PS)
SKULL BASE T - THIGH dated 02/03/2013; NM CARDIA MUGA REST dated
03/24/2013

RADIOPHARMACEUTICALS:  TM.MmDiNc-SSm in-vitro labeled red blood
cells.
FINDINGS: No focal wall motion abnormality.  Mild left ventricular dilatation.

Calculated left ventricular ejection fraction equals 53%. This
compares to 50% on most recent prior exam 03/24/2013.
IMPRESSION: 1. Left ventricular ejection fraction equals 53%.
2. No change from comparison.

## 2014-11-05 ENCOUNTER — Ambulatory Visit
Admit: 2014-11-05 | Disposition: A | Discharge status: Home / Self Care | Payer: Self-pay | Attending: Oncology | Admitting: Oncology

## 2014-11-06 LAB — CANCER ANTIGEN 27.29: CA 27.29: 34.1 U/mL (ref 0.0–38.6)

## 2014-11-06 NOTE — Op Note (Signed)
PATIENT NAME:  Abigail Ortega, Abigail Ortega MR#:  [id number] DATE OF BIRTH:  [birth year]  DATE OF PROCEDURE:  02/21/2013  PREOPERATIVE DIAGNOSIS: Invasive mammary carcinoma of the right breast.   OPERATIVE PROCEDURE: Right modified radical mastectomy with sentinel node biopsy.   CLINICAL NOTE: This 64 year old woman had an abnormal mammogram and biopsy showed invasive ductal carcinoma. She desired mastectomy. Sentinel node biopsy was planned.   PROCEDURE IN DETAIL:  The patient was brought to the operating room and underwent general anesthesia by LMA without difficulty. The breast, chest, and axilla was prepped with ChloraPrep and draped. Prior to prepping 6 mL of methylene blue diluted 1:2 with normal saline (total volume 6 mL) was instilled in the subareolar plexus. She had previously undergone injection with technetium sulfur colloid. An elliptical incision was made and transversely orientated on the breast. The skin was incised sharply and the remaining dissection was completed with electrocautery. Hemostasis was with electrocautery and 3-0 Vicryl ties. The axillary envelope was opened and two nodes identified. A 33 mm macro metastasis was noted in one of these two nodes as reported by Quay Burow, M.D. from pathology. It was elected to proceed with axillary dissection. The breast was removed from the chest wall with the borders of dissection just below the clavicle superiorly, the costochondral cartilage medially, rectus fascia inferiorly and the serratus muscle laterally. The breast was then elevated off the underlying muscle taking the fascia of that muscle with the specimen. This was sent fresh to pathology per protocol. The axillary envelope was opened and the Harmonic scalpel used to free the generous fat pad. The axillary vein was identified and the axillary contents swept inferiorly. The long thoracic nerve of Bell and thoracodorsal nerve, artery and vein bundle were identified and protected. Both nerves  were functional at the end of the procedure. The axillary contents were sent in formalin for routine histology. The wound was irrigated with water. Good hemostasis was noted. The Blake drain was brought out through the lower medial flap and anchored in place with 3-0 nylon. The skin was approximated with running 2-0 Vicryl in the deep dermal layer in two segments. Benzoin and Steri-Strips were applied. Telfa pad, fluffed gauze and Kerlix were placed followed by an Ace wrap. The patient tolerated the procedure well and was taken to the recovery room in stable condition.    ____________________________ Robert Bellow, MD jwb:cc D: 02/21/2013 19:07:00 ET T: 02/21/2013 22:00:27 ET JOB#: [id number]  cc: Robert Bellow, MD, <Dictator> Perrin Maltese, MD Lasandra Batley Amedeo Kinsman MD ELECTRONICALLY SIGNED 02/22/2013 9:35

## 2014-11-06 NOTE — Op Note (Signed)
PATIENT NAME:  Abigail Ortega, Abigail Ortega MR#:  [id number] DATE OF BIRTH:  1951-04-11  DATE OF PROCEDURE:  03/27/2013  DATE OF DICTATION: 03/28/2013  PREOPERATIVE DIAGNOSIS: Breast cancer, need for central venous access.   POSTOPERATIVE DIAGNOSIS: Breast cancer, need for central venous access.   OPERATIVE PROCEDURE: Left subclavian PowerPort placement.   OPERATING SURGEON:  Robert Bellow, M.D.  ANESTHESIA: Attended local, 20 mL 0 of 1% plain Xylocaine.   ESTIMATED BLOOD LOSS: Minimal.   CLINICAL NOTE:  This 64 year old woman has undergone a right mastectomy for cancer and was found to be a candidate for adjuvant chemotherapy. Central venous access was requested by her treating oncologist.   OPERATIVE NOTE: The patient received Kefzol prior to the procedure. Under sedation, the left chest was prepped with ChloraPrep and draped. The subclavian vein was found to be patent on ultrasound examination. It was cannulated under ultrasound guidance. The guidewire was advanced into the SVC followed by the dilator. The catheter was then positioned at the junction of the SVC and right atrium. It was tunneled to a pocket on the left chest. Three-point fixation of the port was undertaken. It easily irrigated and aspirated with the patient in the supine position. The wound was closed in layers with 3-0 Vicryl deep and a running 4-0 Vicryl subcuticular suture for the skin. Benzoin and Steri-Strips were  applied after flushing the catheter with 10 mL of injectable saline.   Correct portable chest x-ray in the recovery room showed no evidence of pneumothorax and catheter tip as described above. The patient tolerated the procedure well.     ____________________________ Robert Bellow, MD jwb:nts D: 03/28/2013 16:20:27 ET T: 03/28/2013 22:55:48 ET JOB#: [id number]  cc: Robert Bellow, MD, <Dictator> Kathlene November. Grayland Ormond, MD Perrin Maltese, MD Dyami Umbach Amedeo Kinsman MD ELECTRONICALLY SIGNED 03/30/2013  10:19

## 2014-11-07 NOTE — Consult Note (Signed)
Reason for Visit: This 64 year old Female patient presents to the clinic for initial evaluation of  breast cancer .   Referred by Dr. Grayland Ormond.  Diagnosis:  Chief Complaint/Diagnosis   64 year old female with stage IIIa ER/PR positive HER-2/neu overexpressed invasive mammary carcinoma right breast status post modified radical mastectomy with adjuvant chemotherapy plus Herceptin now for chest wall and peripheral lymphatic radiation  Pathology Report pathology report reviewed   Imaging Report mammograms reviewed   Referral Report clinical notes reviewed   Planned Treatment Regimen adjuvant chest wall and peripheral lymphatic radiation   HPI   patient is a pleasant 64 year old female who presents with an abnormal mammogram of right breast showing parenchymal density in the medial portion of the right breast with microcalcifications. BI-RADS 4. Underwent stereotactic biopsypositive for invasive mammary carcinoma. PET scan was performed showing hypermetabolic axillary lymph node in right axilla. Patient underwent right modified radical mastectomy and lymph node dissection. Tumor was aggregate dimension 1.45 cm. 2 sentinel lymph nodes were both positive over all 26 lymph nodes were examined for had macro metastatic disease one with micrometastatic disease. Margins were clear at 45 mm. Tumor was strongly ER/PR positive and overexpressed HER-2/neu. Patient was seen by medical oncology and started on Taxotere carboplatin and Herceptin.she is completed her chemotherapy continues now on Herceptin. She continues to have fatigue nausea and poor appetite but she is slowly improving. Case was presented her weekly tumor conference and recommendation based on more than 4 positive nodes as well as HER-2/neu positive disease to institute right chest wall and peripheral lymphatic radiation. She seen today for the discussion of that recommendation.  Past Hx:    STEMI:    MI:    Hypothyroidism:    Back Pain,  Chronic:    Vertigo:    Gastric Reflux:    CAD:    Anxiety:    chronic bronchitis:    Right Mastectomy with SN biopsy: Aug 2014  Past, Family and Social History:  Past Medical History positive   Cardiovascular carotid endarterectomy; congestive heart failure; coronary artery disease; coronary artery stents; myocardial infarction   Respiratory bronchitis   Gastrointestinal GERD; IBS   Endocrine hypothyroidism   Neurological/Psychiatric anxiety   Past Medical History Comments chronic back pain, vertigo   Family History positive   Family History Comments mother with breast cancer at age 45 expired father with colon cancer   Social History positive   Social History Comments 40-pack-year smoking history quit 3 years prior. No EtOH abuse history   Additional Past Medical and Surgical History accompanied by son today   Allergies:   No Known Allergies:   Home Meds:  Home Medications: Medication Instructions Status  traZODone 50 mg oral tablet 1 tab(s) orally once a day (at bedtime) Active  DULoxetine 20 mg oral delayed release capsule 1 cap(s) orally once a day Active  acetaminophen-HYDROcodone 325 mg-5 mg oral tablet  -for Pain Active  pantoprazole 40 mg oral delayed release tablet 1 tab(s) orally once a day (in the morning) Active  alprazolam 0.25 mg tablet 1 tab(s) orally 2 times a day x 30 days as needed for anxiety. Active  atorvastatin 40 mg oral tablet 1 tab(s) orally once a day (in the morning) Active  melatonin extended release 3 mg oral tablet, extended release 1 tab(s) orally once a day (at bedtime) Active  LEVOTHYROX 112 mcg (0.112 mg) oral capsule 1 cap(s) orally once a day Active  ramipril 5 mg oral capsule 1 cap(s) orally once a  day Active  Plavix 75 mg oral tablet 1 tab(s) orally once a day Active  aspirin 81 mg oral tablet 1 tab(s) orally once a day Active  magnesium oxide 500 mg oral tablet 1 tab(s) orally once a day Active   Review of Systems:   General negative   Performance Status (ECOG) 0   Skin negative   Breast see HPI   Ophthalmologic negative   ENMT negative   Respiratory and Thorax negative   Cardiovascular see HPI   Gastrointestinal negative   Genitourinary negative   Musculoskeletal negative   Neurological negative   Psychiatric negative   Hematology/Lymphatics negative   Endocrine negative   Allergic/Immunologic negative   Review of Systems   review of systems obtained from nurses notes  Nursing Notes:  Nursing Vital Signs and Chemo Nursing Nursing Notes: *CC Vital Signs Flowsheet:   03-Mar-15 15:18  Temp Temperature 97  Pulse Pulse 79  Respirations Respirations 20  SBP SBP 112  DBP DBP 68  Pain Scale (0-10)  0  Current Weight (kg) (kg) 78.8  Height (cm) centimeters 169.5  BSA (m2) 1.8   Physical Exam:  General/Skin/HEENT:  General normal   Skin normal   Eyes normal   ENMT normal   Head and Neck normal   Additional PE well-developed female in NAD with alopecia of the scalp. Lungs are clear to A&P cardiac examination shows regular in rhythm. She status post right modified radical mastectomy. Incision is healed well. No evidence of skin wall mass or nodularity is noted. Left breast is free of dominant mass or nodularity in 2 positions examined. No axillary or supraclavicular adenopathy is appreciated.   Breasts/Resp/CV/GI/GU:  Respiratory and Thorax normal   Cardiovascular normal   Gastrointestinal normal   Genitourinary normal   MS/Neuro/Psych/Lymph:  Musculoskeletal normal   Neurological normal   Lymphatics normal   Other Results:  Radiology Results: LabUnknown:    22-May-14 10:13, Digital Additional Views Rt Breast (SCR)  PACS Image     21-Jul-14 14:57, PET/CT Scan Breast CA Stage/Restaging  PACS [place]:    22-May-14 10:13, Digital Additional Views Rt Breast (SCR)  Digital Additional Views Rt Breast (SCR)   REASON FOR EXAM:    AV RT  PARENCHYMAL DENSITY  COMMENTS:       PROCEDURE: MAM - MAM DIG ADDVIEWS RT SCR  - Dec 05 2012 10:13AM     RESULT: Persistent parenchymal ill-defined density in the medial portion   right breast. The density contains microcalcifications. Surgical   evaluation of this lesion is suggested as malignancy can present in this   fashion.    IMPRESSION:  Persistent parenchymal density medial portion right breast   with microcalcifications. Surgical evaluation suggested as this lesion   could represent malignancy. BI-RADS: Category 4 - Suspicious Abnormality   - Biopsy Should Be Considered  Thank you for the oppurtunity to contribute to the care of your patient. Marland Kitchen    BREAST COMPOSITION: The breast composition is HETEROGENEOUSLYDENSE   (glandular tissue is 51-75%) This may decrease the sensitivity of   mammography.        Verified By: Osa Craver, M.D., MD  Nuclear Med:    21-Jul-14 14:57, PET/CT Scan Breast CA Stage/Restaging  PET/CT Scan Breast CA Stage/Restaging   REASON FOR EXAM:    Staging Breast Ca Elevated LFTs and CEA  COMMENTS:       PROCEDURE: PET - PET/CT RESTG BREAST CA  - Feb 03 2013  2:57PM  RESULT: Comparison: None.    Radiopharmaceutical: 11.32 mCi F18-FDG, intravenously.    Technique: Imaging was performed from the skull base to the mid-thigh   using routine PET/CT acquisition protocol.    Injection site: Right antecubital fossa  Time of FDG injection: 1315 hours  Serum glucose: 83 mg/dL   Time of imaging: 1425 hours through 1449 hours    Findings:  There is increased activity in the region of the left carotid chain at   the level of the angle of the mandible. This may related to   atherosclerotic disease. One or more small hypermetabolic lymph nodes   would be difficult to exclude.    No hypermetabolic mediastinal or axillary lymph nodes. There is a small   hiatal hernia. Mild radiotracer activity associated with the hiatal   hernia and distal  esophagus may related to gastritis/esophagitis, but is   nonspecific. There is a hypermetabolic small right axillary lymph node as   seen on image 105. There are several 3-4 mm nodules along the inferior   aspect of the right major fissure. These are below the resolution of PET,   but similar to chest CT of 11/22/2011.  Hypermetabolic activity associated with the ascending colon, cecum, and   sigmoid colon is likely physiologic. There is physiologic radiotracer   activity in the urinary system. No abnormal foci of hypermetabolic   activity identified in the abdomen or pelvis.    IMPRESSION:   1. Small hypermetabolic lymph node in the right axilla is concerning for   metastatic disease given the patient's history.   2. Mild radiotracer activity in the region of the left carotid chain may   be related to atherosclerotic disease. However, one or more small   hypermetabolic carotid chain lymph nodes would be difficult to exclude.  3. Otherwise, no evidence of FDG avid metastatic disease the remainder of   the neck, chest, abdomen, pelvis.      Verified By: Gregor Hams, M.D., MD   Relevent Results:   Relevant Scans and Labs PET CT scan and mammograms are reviewed   Assessment and Plan: Impression:   stage IIIa ER/PR positive HER-2/neu overexpressed invasive mammary carcinoma right breast status post right modified radical mastectomy and adjuvant chemotherapy continues on Herceptin. Plan:   at this time based on her poor prognostic factors including more than for positive axillary lymph nodes, and overexpression of HER-2/neu believe she would benefit from right chest wall and peripheral lymphatic radiation. We'll plan on delivering 5000 sensory to both regions and boosting her scar another 1600 cGy using electrons Beam. Risks and benefits of treatment including skin reaction, fatigue, alteration of blood counts, inclusion of some superficial lung, and possibility of lymphedema in her right  upper extremity were all explained in detail to the patient. I have asked her to start exercising the arm with a 2 pound weight on a daily basis to prevent lymphedema in the future. I have set her up for CT simulation in about a week's time. Patient will also be a candidate for aromatase inhibitor after completion of radiation.  I would like to take this opportunity for allowing me to participate in the care of your patient..  CC Referral:  cc: Dr. Elmer Bales. Hervey Ard   Electronic Signatures: Baruch Gouty, Roda Shutters (MD)  (Signed 03-Mar-15 16:12)  Authored: HPI, Diagnosis, Past Hx, PFSH, Allergies, Home Meds, ROS, Nursing Notes, Physical Exam, Other Results, Relevent Results, Encounter Assessment and Plan, CC Referring Physician   Last  Updated: 03-Mar-15 16:12 by Armstead Peaks (MD)

## 2014-11-08 NOTE — Consult Note (Signed)
PATIENT NAME:  Abigail Ortega, Abigail Ortega MR#:  [id number] DATE OF BIRTH:  [birth year]  DATE OF CONSULTATION:  01/03/2012  REFERRING PHYSICIAN:  Lamonte Sakai, MD/Dr. Elijio Miles  CONSULTING PHYSICIAN:  John Vasconcelos D. Jaynie Hitch, MD   She was admitted by [facility].   INDICATION: Heart failure, possible non-Q-wave myocardial infarction.   HISTORY OF PRESENT ILLNESS: Abigail Ortega is a 64 year old female with significant history of myocardial infarction with stent placement. She has had heart failure, shortness of breath, chest discomfort, weakness, fatigue. At this time she came in with heart failure. She had had significant weight loss and started consuming extra calories and extra salt and subsequently had significant weight gain as well as recent shortness of breath, fatigue, and weakness. She had trouble catching her breath at rest and with any exertion. She finally called rescue and was subsequently admitted for further evaluation and care. The patient had elevated BNP and was placed on BiPAP initially. She denied any significant chest pain or angina but had significant shortness of breath, dyspnea, diaphoresis, weakness, and fatigue. She was unable to tolerate BiPAP and had severe respiratory distress. She was treated with diuretics, placed on telemetry and Cardiology consultation was then recommended. Cardiac enzymes were elevated with elevated troponin.   REVIEW OF SYSTEMS: No blackout spells or syncope. No nausea or vomiting. No chills. She has had some sweats. She has had some weight gain. No weight loss. No hemoptysis or hematemesis. No bright red blood per rectum. No vision change or hearing change. Denies sputum production or cough.   PAST MEDICAL HISTORY:  1. Reflux.  2. Insomnia. 3. Anxiety. 4. Chronic pain syndrome.  5. Hypothyroidism.  6. Coronary disease. 7. Diverticulosis.  8. Colonic polyps. 9. Osteoarthritis. 10. Allergic rhinitis. 11. Hemorrhoids. 12. Angina. 13. Non-Q-wave myocardial  infarction.   PAST SURGICAL HISTORY:  1. BTL. 2. EGD. 3. Status post stent placement.  4. Carotid endarterectomy for peripheral vascular disease.   ALLERGIES: None.   MEDICATIONS:  1. Aspirin 81 mg a day.  2. Plavix 75 a day.  3. Lipitor 40 a day.  4. Levothyroxine 75 mcg a day. 5. Lorazepam 1 mg 3 times a day.  6. Metoprolol XL 25 a day.  7. Zofran 4 mg every eight hours.  8. Protonix 40 a day.  9. Paroxetine 20 mg daily.   FAMILY HISTORY: Breast cancer, diabetes, hypertension, HIV.   SOCIAL HISTORY: Smoked 2 packs a day but quit three years ago. She still works. Denies alcohol consumption.   PHYSICAL EXAMINATION:   VITAL SIGNS: Blood pressure 120/80, pulse 75, respiratory rate 20, afebrile.   HEENT: Normocephalic, atraumatic. Pupils equal and reactive to light.   NECK: Supple. Mild JVD. No significant bruits.   LUNGS: Bilateral rhonchi in the bases. No significant rales now, must be improved from admission. Adequate air movement. No wheezing.   HEART: Regular rate and rhythm. Systolic ejection murmur left sternal border. Positive S4. Soft S3. PMI slightly displaced.   ABDOMEN: Benign. Positive bowel sounds. No rebound, guarding, or tenderness.   EXTREMITIES: Within normal limits. No significant cyanosis, clubbing, or edema.   NEUROLOGIC: Grossly intact.   SKIN: Normal.   LABORATORY, DIAGNOSTIC, AND RADIOLOGICAL DATA: Glucose 180. BNP 4052. BUN 19, creatinine 1.15, sodium 139, potassium 3.7, chloride 105, CO2 25. Troponin was 0.04, increased to 0.59. White count 76, hemoglobin 11.2, hematocrit 33.9, platelet count 321. D-dimer 0.4.   Chest x-ray bilateral pulmonary vascular congestion.   ASSESSMENT:  1. Congestive heart failure. 2. Dyspnea. 3. Shortness  of breath.  4. Cardiomyopathy.  5. Coronary disease. 6. Hyperlipidemia. 7. Hypothyroidism. 8. Reflux. 9. Anxiety. 10. Peripheral vascular disease. 11. Thyroid disease.   PLAN:  1. Agree with admit.   2. Follow cardiac enzymes. Troponin is increased significantly up to 0.59 from normal baseline.  3. Anticoagulation. 4. Check echocardiogram.  5. Continue beta-blockers. 6. Continue statin therapy.  7. Continue reflux medicine. 8. May add low dose ACE or ARB. 9. Consider cardiac cath with elevated troponin.  10. Heart failure education and dietary restrictions. Will have the patient consult with the dietitian and base further evaluation on how she does.    ____________________________ Loran Senters Clayborn Bigness, MD ddc:drc D: 01/03/2012 17:03:54 ET T: 01/03/2012 17:31:34 ET JOB#: [id number]  cc: Malissa Slay D. Clayborn Bigness, MD, <Dictator> Yolonda Kida MD ELECTRONICALLY SIGNED 01/29/2012 6:36

## 2014-11-08 NOTE — Consult Note (Signed)
Present Illness 64 year old female with known cardiovascular disease status post previous myocardial infarction PCI and stent placement, hypertension, and hyperlipidemia.  The patient has been on appropriate medications for further risk reduction, but has been having some abdominal discomfort and tingling.  This is radiating into her chest at times.  Currently the patient has not had any episodes of rhythm disturbances and/or new EKG changes.  She does have some diffuse EKG changes with an EKG showing normal sinus rhythm with diffuse T-wave inversion.  She currently has an elevated troponin of 0.08 were consistent with current illness rather than acute myocardial infarction.  There may be some demand ischemia.  The patient continues to have tingling but no true angina.  Currently more her symptoms aren't gastric in nature  Family history No family members regarding onset of cardiovascular disease  Social history Patient currently denies alcohol or tobacco use   Physical Exam:   GEN WD    HEENT pink conjunctivae    NECK No masses    RESP crackles    CARD Regular rate and rhythm    ABD denies tenderness  soft    LYMPH negative neck    EXTR negative cyanosis/clubbing    SKIN No rashes    NEURO cranial nerves intact    PSYCH alert   Review of Systems:   Subjective/Chief Complaint I have tingling    Review of Systems: All other systems were reviewed and found to be negative    Medications/Allergies Reviewed Medications/Allergies reviewed     STEMI:    MI:    Hypothyroidism:    Back Pain, Chronic:    Vertigo:    Gastric Reflux:    CAD:    Anxiety:    chronic bronchitis:   Home Medications: Medication Instructions Status  lorazepam 1 mg oral tablet 1 tab(s) orally 3 times a day  Active  levothyroxine 75 mcg (0.075 mg) oral tablet 1 tab(s) orally once a day Active  aspirin 81 mg oral tablet 1 tab(s) orally once a day Active  metoprolol succinate 25 mg oral  tablet, extended release tab(s) orally  Active  orvastatin 43m tab qday  Active  pantoprazole 40 mg oral delayed release tablet 1 tab(s) orally once a day Active  ondansetron 4 mg oral tablet 1 tab(s) orally every 8 hours Active  zolpidem 12.5 mg oral tablet, extended release 1 tab(s) orally once a day (at bedtime) Active  atorvastatin 40 mg oral tablet 1 tab(s) orally once a day (at bedtime) Active  metoprolol tartrate 25 mg oral tablet 1/2tab(s) orally 2 times a day Active  paroxetine 20 mg oral tablet 1 tab(s) orally once a day Active  clopidogrel 75 mg oral tablet 1 tab(s) orally once a day Active  omeprazole 40 mg oral delayed release capsule 1 cap(s) orally once a day Active   Cardiac:  16-Feb-13 06:01    Troponin I 0.08   CK, Total 212   CPK-MB, Serum 2.5  Routine Hem:  16-Feb-13 06:01    WBC (CBC) 4.4   RBC (CBC) 3.98   Hemoglobin (CBC) 12.5   Hematocrit (CBC) 37.8   Platelet Count (CBC) 270   MCV 95   MCH 31.4   MCHC 33.1   RDW 12.8  Routine Chem:  16-Feb-13 06:01    Glucose, Serum 131   BUN 7   Creatinine (comp) 1.05   Sodium, Serum 127   Potassium, Serum 3.4   Chloride, Serum 91   CO2, Serum 22  Calcium (Total), Serum 9.4  Hepatic:  16-Feb-13 06:01    Bilirubin, Total 0.6   Alkaline Phosphatase 97   SGPT (ALT) 22   SGOT (AST) 21   Total Protein, Serum 7.5   Albumin, Serum 4.3  Routine Chem:  16-Feb-13 06:01    Osmolality (calc) 255   eGFR (African American) >60   eGFR (Non-African American) 57   Anion Gap 14   Magnesium, Serum 1.5  Cardiac:  16-Feb-13 16:04    Troponin I 0.07   CK, Total 214   CPK-MB, Serum 2.6    23:18    Troponin I 0.06   CK, Total 195   CPK-MB, Serum 2.7   EKG:   EKG Interp. by me    Interpretation normal sinus rhythm with diffuse T-wave inversion    No Known Allergies:   Vital Signs/Nurse's Notes: **Vital Signs.:   17-Feb-13 04:30   Vital Signs Type Routine   Temperature Temperature (F) 97.9   Celsius 36.6    Temperature Source oral   Pulse Pulse 51   Pulse source per Dinamap   Respirations Respirations 16   Systolic BP Systolic BP 99   Diastolic BP (mmHg) Diastolic BP (mmHg) 66   Mean BP 77   BP Source Dinamap   Pulse Ox % Pulse Ox % 98   Pulse Ox Activity Level  At rest   Oxygen Delivery Room Air/ 21 %     Impression 64 year old female with known coronary artery disease, status post previous myocardial infarction, hypertension, hyperlipidemia, and previous stent placements with abdominal discomfort and tingling more consistent with a gastric issue and/or side effects of medications rather than true unstable angina.  The patient has some mild elevation of troponin more consistent with current illness rather than acute myocardial infarction    Plan 1.  No further cardiac workup and diagnostic to this time due to no evidence of true angina at this time. 2.  Further evaluation of gastric issues, specifically looking for possible side effects of medications that she is currently taking, including the possibility of antiplatelets. 3.  Continue hypertension control with current medical regimen and adjustments as of above if needed. 4.  Ambulate and follow for any further significant symptoms including chest discomfort. 5.  Possible discharge to home with further outpatient treatment   Electronic Signatures: Corey Skains (MD)  (Signed 17-Feb-13 07:38)  Authored: General Aspect/Present Illness, History and Physical Exam, Review of System, Past Medical History, Home Medications, Labs, EKG , Allergies, Vital Signs/Nurse's Notes, Impression/Plan   Last Updated: 17-Feb-13 07:38 by Corey Skains (MD)

## 2014-11-08 NOTE — H&P (Signed)
PATIENT NAME:  Abigail Ortega, Abigail Ortega MR#:  [id number] DATE OF BIRTH:  April 15, 1951  DATE OF ADMISSION:  09/02/2011  PRIMARY CARE PHYSICIAN: [facility], [place], [place]  REFERRING ED PHYSICIAN: Dr. Thomasene Lot  CHIEF COMPLAINT: Chest pressure, nausea.   HISTORY OF PRESENT ILLNESS: Patient is a 64 year old white female with past medical history of coronary artery disease status post two stents in September, also has a history of gastroesophageal reflux disease, insomnia, anxiety, hypothyroidism, chronic back pain who also has had chronic difficulty with nausea and has been diagnosed with irritable bowel syndrome, presents to the ED with complaint of chest pressure on and off which has progressively gotten worse. She reports that she has been having chest pressure for months. Has been seen by Dr. Clayborn Bigness within the last month, had a stress test and an echo that was apparently okay. She reports that since yesterday her chest pressure has gotten worse. Therefore, she came to the ED. In the ER she was noted to have a slightly elevated troponin of 0.08. She also has severe nausea which she reports that she developed severe nausea followed by this chest pressure. She otherwise also complains of having multiple other symptoms including aching all over her body, feeling dizzy with lightheadedness. She reports that she is not able to eat due to the nausea. She also complains of tingling in her lower extremity. She also complains of abdominal cramping.   PAST MEDICAL HISTORY:  1. Gastroesophageal reflux disease.  2. Insomnia.  3. Anxiety.  4. Chronic pain syndrome.  5. Hypothyroidism.  6. Coronary artery disease, status post two stents.  7. Diverticulosis.  8. Colonic polyps.  9. Status post left carotid endarterectomy.  10. Osteoarthritis.  11. Allergic rhinitis.  12. History of hemorrhoids.   ALLERGIES: None.   PAST SURGICAL HISTORY:  1. Status post bilateral tubal ligation.  2. History of  EGD.   CURRENT MEDICATIONS:  1. Aspirin 81, 1 tab p.o. daily.  2. Levothyroxine 75 mcg daily.  3. Lorazepam 1 mg 1 tab p.o. t.i.d.  4. Metoprolol succinate 25 extended-release daily.  5. Zofran 4 mg 1 tab p.o. q.8. 6. Pravastatin 40 daily.  7. Protonix 40 daily.   SOCIAL HISTORY: Patient quit smoking in December; prior to that she smoked about 1-1/2 to 2 packs per day. No alcohol or drug use.   FAMILY HISTORY: Mother died of breast cancer. She also had diabetes and hypertension. One brother had HIV.   REVIEW OF SYSTEMS: CONSTITUTIONAL: Complains of no fever. Complains of fatigue, weakness, chronic pain all over. Complains of weight loss. No weight gain. EYES: No blurred or double vision. No pain. No redness. No inflammation. No glaucoma. No cataracts. ENT: No tinnitus. No ear pain. No hearing loss. No seasonal or year-round allergies. No nasal discharge. No snoring. No postnasal drip. RESPIRATORY: No cough. No wheezing. No hemoptysis. No dyspnea. No asthma. No painful respirations. No chronic obstructive pulmonary disease. CARDIOVASCULAR: Complains of chest pressure. No orthopnea. No edema. No arrhythmia. No palpitations. No syncope. GASTROINTESTINAL: Complains of chronic nausea but no vomiting. No diarrhea. Complains of abdominal cramping. Has been diagnosed with irritable bowel syndrome. No hematemesis. No melena. No ulcer. Does have gastroesophageal reflux disease. GENITOURINARY: Denies any dysuria, hematuria, renal calculi or frequency. ENDO: Denies any polydipsia, nocturia. Does have hypothyroidism. Reports that her TSH was abnormal. HEME/LYMPH: Denies any anemia, easy bruisability or bleeding or swollen glands. MUSCULOSKELETAL: Has chronic pain in multiple areas of the joints. No gout. NEUROLOGIC: No numbness. No  weakness. Complains of tingling in both of her lower extremities. PSYCHIATRIC: Does have a history of anxiety, insomnia. No ADD. No OCD. No bipolar or schizophrenia.   PHYSICAL  EXAMINATION:  VITAL SIGNS: Temperature 97.8, pulse 62, respirations 18, blood pressure 135/83, O2 100% on room air.   GENERAL: Patient is a little anxious.   HEENT: Head atraumatic, normocephalic. Pupils are equal, round, reactive to light and accommodation. Extraocular movements intact. Sclerae anicteric. No conjunctival pallor. Nasal exam shows no drainage or ulceration. Oropharynx is clear without any exudates.   NECK: No thyromegaly. No carotid bruits.   CARDIOVASCULAR: Regular rate and rhythm. No murmurs, rubs, clicks, or gallops. PMI is not displaced.   LUNGS: Clear to auscultation bilaterally without any rales, rhonchi, wheezing.   ABDOMEN: Soft, nontender, nondistended. Positive bowel sounds x4. There is no guarding, no rebound.   EXTREMITIES: No clubbing, cyanosis, edema.   NEUROLOGIC: Awake, alert, oriented x3. No focal deficits.   SKIN: There is no rash.   LYMPHATICS: No lymph nodes palpable.   VASCULAR: Good DP, PT pulses.   PSYCHIATRIC: A little anxious, not depressed.   NEUROLOGICAL: Awake, alert, oriented x3. No focal deficits.   MUSCULOSKELETAL: There is no erythema or swelling.   LABORATORY, DIAGNOSTIC, AND RADIOLOGICAL DATA: Chest x-ray shows no acute cardiopulmonary disease. Hyperinflation seen. Troponin 0.08, CPK 212, CK-MB 2.5. WBC 4.4, hemoglobin 12.5, platelet count 270. EKG showed normal sinus rhythm, possible left atrial enlargement, left axis deviation, lateral infarct as well as inferior posterior infarct. BMP: Glucose 131, creatinine 1.05, sodium 127, potassium 3.4, chloride 91, CO2 22. LFTs were normal.   ASSESSMENT AND PLAN: Patient is a 64 year old female with history of coronary artery disease, anxiety, depression presents to the ED with chest pain and worsening nausea and multiple other complaints.  1. Chest pressure with history of known coronary artery disease. Has had stents in the past who also had a recent stress test due to her symptoms,  presents with worsening chest pressure. Her symptoms appear to be atypical, however, her troponin is slightly elevated. At this time will place patient on observation, check serial enzymes. Will ask her cardiologist for any further recommendations. Will continue aspirin. Patient was on Plavix which is not currently active as one of her medications; will restart this and p.r.n. sublingual nitroglycerin.  2. Nausea which is worsening. Also may be contributing to her cardiac symptoms. Will ask Dr. Dionne Milo to come evaluate for any further recommendations. Continue antiemetics and PPIs.  3. Hypothyroidism. Continue levothyroxine. Will check a TSH.  4. Hyponatremia with similar type of hyponatremia in the past. At this time will give her IV fluids.  5. Hypokalemia. Will give her potassium.  6. Chronic pain syndrome. Will use Tylenol as needed.  7. Gastroesophageal reflux disease. Continue PPIs.  8. MISCELLANEOUS: Patient will be on PPIs as well as Lovenox for deep vein thrombosis prophylaxis.   TIME SPENT: 35 minutes.   ____________________________ Lafonda Mosses Posey Pronto, MD shp:cms D: 09/02/2011 09:07:40 ET T: 09/02/2011 09:49:53 ET JOB#: [id number]  cc: Sincere Liuzzi H. Posey Pronto, MD, <Dictator> Kizzie Furnish. Prudence Davidson, [place] Alric Seton MD ELECTRONICALLY SIGNED 09/22/2011 7:47

## 2014-11-08 NOTE — Discharge Summary (Signed)
PATIENT NAME:  Abigail Ortega, Abigail Ortega MR#:  [id number] DATE OF BIRTH:  Feb 23, 1951  DATE OF ADMISSION:  01/03/2012 DATE OF DISCHARGE:  01/05/2012  DISCHARGE DIAGNOSES:  1. Acute on chronic systolic congestive heart failure.  2. Non-ST elevation myocardial infarction.   PROCEDURE: Echocardiogram.   CONSULTATIONS: Cardiology, Dr. Clayborn Bigness.   DISCHARGE MEDICATIONS: Resume home medications.  Additional medications are:  1. Lasix 40 mg daily. 2. Ramipril 2.5 mg daily.   HOSPITAL COURSE: The patient was admitted with worsening of dyspnea on exertion and clinical findings suggestive of decompensated congestive heart failure. Please refer to the History and Physical for full details. She was admitted to the monitored floor. She underwent cycled cardiac enzymes which yielded elevated troponin which peaked at 0.59. She responded well to diuresis with about 3.5 liters urine output. She did develop acute kidney injury. However, I adjusted her Lasix downwards, which resulted in normalization of her creatinine the following day. I consulted Cardiology, Dr. Clayborn Bigness, who admitted the patient and concurred with congestive heart failure management, namely diuresis with balanced vasodilation. He did not perform a left heart catheterization and said he would follow up the patient in the outpatient setting.   The patient is discharged home in a satisfactory condition. Other comorbidities were stable. Her echocardiogram performed showed deterioration in her ejection fraction to 25 to 30%; and, as such, ACE inhibitor has been added to help with her afterload.   DIET: Low sodium.   ACTIVITY: No heavy lifting.   FOLLOWUP:  1. Follow up with Dr. Clayborn Bigness in one week. 2. Follow up with Dr. Lamonte Sakai in two weeks.   DISCHARGE CONDITION: Satisfactory.   DISCHARGE PROCESS TIME: 35 minutes spent.   ____________________________ Venetia Maxon. Elijio Miles, MD sat:cbb D: 01/05/2012 13:24:32 ET T: 01/05/2012 15:32:55  ET JOB#: [id number]  cc: Alfredia Ferguson A. Elijio Miles, MD, <Dictator> Veverly Fells MD ELECTRONICALLY SIGNED 01/15/2012 13:32

## 2014-11-08 NOTE — H&P (Signed)
PATIENT NAME:  Abigail Ortega, Abigail Ortega MR#:  [id number] DATE OF BIRTH:  [birth year]  DATE OF ADMISSION:  01/02/2012  REFERRING PHYSICIAN: Dr. Payton Doughty   PRIMARY CARE PHYSICIAN: Dr. Lamonte Sakai   CHIEF COMPLAINT: Shortness of breath.   HISTORY OF PRESENT ILLNESS: This is a 64 year old female with a significant past medical history of coronary artery disease status post cardiac stent in September 2012 by Dr. Clayborn Bigness for non-ST elevated MI. The patient has been doing good since then. She started to complain of shortness of breath for the last week. Reports her chest pain is mainly exertional but as well had been complaining of chest pain upon laying flat where she currently requires to sleep at least on two pillows and this is all over the last week. The patient reports she was diagnosed with hyponatremia where she saw nephrologist, Dr. Anthonette Legato, where she has been eating a lot of chips recently and drinking a lot of fluids, mainly Gatorade, as well. Upon initial presentation to the ED, the patient was hypoxic where she required BiPAP. As well, the patient had elevated BNP and her chest x-ray showed vascular congestion where she was given 20 mg of IV Lasix where the patient diuresed and has been on BiPAP for some time. She currently reports she has been feeling significant improvement where currently she is tolerating room air, is comfortable and speaks in full sentences. As well, the patient reports she has been complaining of chest pain reproducible by palpation and deep breath over the last couple of days as well. Denies any lightheadedness, altered mental status, loss of consciousness, palpitation, diaphoresis, or nausea. The patient's EKG did not show any significant ST or T wave changes. As well, her first set of cardiac enzymes is negative. The patient reports she is scheduled to see Dr. Clayborn Bigness on July 24th.    PAST MEDICAL HISTORY:  1. Gastroesophageal reflux disease.  2. Insomnia.  3. Anxiety.   4. Chronic pain syndrome.  5. Hypothyroidism.  6. Coronary artery disease, status post two stents.  7. Diverticulosis.  8. Colonic polys.  9. Status post left carotid endarterectomy.  10. Osteoarthritis.  11. Allergic rhinitis.  12. History of hemorrhoids.   ALLERGIES: None.   PAST SURGICAL HISTORY:  1. Status post bilateral tubal ligation.  2. History of EGD.   CURRENT MEDICATIONS:  1. Aspirin 81 mg daily.  2. Plavix 75 mg daily.  3. Atorvastatin 40 mg at bedtime.  4. Levothyroxine 75 mcg daily.  5. Lorazepam 1 mg 3 times a day.  7. Metoprolol XL 25 daily.  8. Zofran 4 mg every eight hours.  9. Protonix 40 mg daily.  10. Paroxetine 20 mg daily.   SOCIAL HISTORY: The patient quit smoking in December. Used to smoke 1-1/2 to 2 packs a day. No alcohol or drug use. Works in a [employer].   FAMILY HISTORY: Mother died of breast cancer. She had diabetes and hypertension as well. One brother has HIV.   REVIEW OF SYSTEMS: Denies any fever, fatigue, weakness. EYES: Denies blurry vision, double vision, pain. ENT: Denies tinnitus, ear pain, hearing loss. RESPIRATORY: Denies cough, hemoptysis. Had wheezing. CARDIOVASCULAR: Had chest pain. Denies any orthopnea. Had mild edema. Denies any arrhythmia or palpitations. GI: Denies nausea, vomiting, diarrhea, abdominal pain. GU: Denies dysuria, hematuria, renal colic. ENDOCRINE: Denies polyuria, polydipsia, heat or cold intolerance. HEMATOLOGY: Denies anemia, easy bruising, bleeding diathesis. INTEGUMENTARY: Denies any acne, rash, or lesions. NEUROLOGIC: Denies any numbness, dysarthria, epilepsy, tremors. PSYCH: Denies any alcohol  or drug abuse. Has history of anxiety and insomnia.   PHYSICAL EXAMINATION:   VITAL SIGNS:  Pulse 76, respiratory rate 18, blood pressure 114/80, saturating 96% on room air.   GENERAL: Well nourished female who is comfortable in bed in no apparent distress.   HEENT: Head atraumatic, normocephalic. Pupils equal,  reactive to light. Pink conjunctivae. Anicteric sclerae. Moist oral mucosa.   NECK: Supple. No thyromegaly. No JVD. Has left side endarterectomy scar.   CHEST: The patient had good air entry bilaterally with bibasilar crackles.   CARDIOVASCULAR: S1, S2 heard. No rubs, murmur, or gallops.   ABDOMEN: Soft, nontender, nondistended. Bowel sounds present.   EXTREMITIES: No edema. No clubbing. No cyanosis.   PSYCH: Appropriate affect. Awake, alert x3. Intact judgment and insight.   NEUROLOGIC: Cranial nerves grossly intact. Motor nonfocal.   PERTINENT LABS: Glucose 180. BNP 4152. BUN 19, creatinine 1.15, sodium 139, potassium 3.7, chloride 105, CO2 25. Troponin 0.04. White blood cells 7.6, hemoglobin 11.2, hematocrit 33.9, platelets 321. D-dimer 0.4.   Chest x-ray showing bilateral vascular congestion.   ASSESSMENT AND PLAN:  1. Dyspnea, shortness of breath. There is most likely due to acute CHF which is most likely provoked by the patient's excessive intake of salt and fluids, mainly Gatorade, over the last two weeks secondary to her hyponatremia. Last EF on the cardiac cath was 40%. Will repeat 2-D echo to evaluate her EF or if there is any evidence of any hypokinesis. Will start on IV Lasix 40 mg daily. Continue her on beta-blocker and depending on echo finding will consider ACE inhibitor.  2. History of coronary artery disease. Continue with aspirin, Plavix, and statin.  3. Chest pain, atypical, reproducible by palpation and deep breath, most likely musculoskeletal. No EKG changes. Will cycle cardiac enzymes.  4. Hyperlipidemia. Continue with statin. 5. Hypothyroidism. Continue with Synthroid. 6. DVT prophylaxis. Sub-Q heparin. 7. GI prophylaxis. Protonix.  CODE STATUS: FULL CODE.   TOTAL TIME SPENT ON PATIENT CARE: 55 minutes.   ____________________________ Albertine Nylani, MD dse:drc D: 01/03/2012 03:20:47 ET T: 01/03/2012 09:23:37 ET JOB#: [id number]  cc: Albertine Trinda,  MD, <Dictator> Perrin Maltese, MD Shaquoya Cosper Graciela Husbands MD ELECTRONICALLY SIGNED 01/04/2012 5:44

## 2014-11-08 NOTE — Consult Note (Signed)
PATIENT NAME:  Abigail Ortega, Abigail Ortega MR#:  [id number] DATE OF BIRTH:  29-Aug-1950  DATE OF CONSULTATION:  09/03/2011  REFERRING PHYSICIAN:  Dr. Loura Back PHYSICIAN:  Jill Side, MD  REASON FOR CONSULTATION: Nausea.   HISTORY OF PRESENT ILLNESS: The patient is a 64 year old female with history of coronary artery disease status post stent placement in September.  The patient also has history of hypothyroidism, chronic back pain, insomnia, severe anxiety, and irritable bowel syndrome. The patient has seen Dr. Candace Cruise and an extensive work-up has been done by Dr. Candace Cruise in the past for her persistent bloating, nausea, and indigestion. That work-up has been fairly negative. The patient finally left [facility] and has started seeing me at [facility] recently with  similar symptoms. An ultrasound of the abdomen was done which was unremarkable. Previous records have been reviewed from Dr. Myrna Blazer office and as mentioned above multiple tests including an EGD, CT scan, small bowel evaluations, etc. have been negative. The patient has been given different medications for suspected irritable bowel syndrome and nonulcer dyspepsia including Digex?, Bentyl, etc. Every time a new medicine is prescribed the patient gets great results for a few days, only for her nausea to return after that. She was admitted to the hospital yesterday with nausea as well as chest pain. Troponin was 0.08. The patient has been evaluated by Dr. Nehemiah Massed. He is not convinced that the patient is having any acute coronary issues. She was also started on Zofran and feels better today. According to her, Zofran is helping her with nausea. She is still complaining of a lot of bloating and passage of a lot of gas after meals. She has been eating quite a bit of eggs, as according to her she has not been able to eat other food for breakfast such as oatmeal, etc.  No alarming symptoms such as weight loss, vomiting, hematemesis,  hematochezia, or  diarrhea.   PAST MEDICAL HISTORY:  1. History of gastroesophageal reflux disease.  2. Insomnia.  3. Anxiety.  4. Chronic pain syndrome. 5. Diverticulosis.  6. History of coronary artery disease status post stent placement.  7. History of colonic polyps.  8. History of left carotid endarterectomy. 9. Osteoarthritis.  10. History of hemorrhoids.   ALLERGIES: None.   PAST SURGICAL HISTORY:   1. EGD done a while ago by Dr. Candace Cruise. 2. Status post bilateral tubal ligation.   HOME MEDICATIONS:  1. Aspirin. 2. Levothyroxine.  3. Lorazepam.  4. Metoprolol. 5. Pravastatin. 6. Protonix.   SOCIAL HISTORY: She quit smoking in December of last year. She used to smoke 1-1/2 to 2 packs a day. No alcohol or drugs.   FAMILY HISTORY: Family history is unremarkable.   REVIEW OF SYSTEMS: Negative except for what is mentioned in the History of Present Illness.   PHYSICAL EXAMINATION: GENERAL:  Thin but otherwise fairly well built female, does not appear to be in any acute distress.  Fully awake, alert, and oriented.  VITAL SIGNS: Temperature 97.9, pulse 60, respirations 16, blood pressure 100/66.   SKIN:  Pink and warm. No jaundice or anemia was noted.   NECK: Veins are flat.   LUNGS: Grossly clear to auscultation bilaterally with fair air entry and no added sounds.   CARDIOVASCULAR: Regular rate and rhythm. No gallops or murmur.   ABDOMEN: Soft. Bowel sounds are positive. No rebound or guarding was noted. Mild diffuse tenderness was felt without any other abnormalities. No hepatosplenomegaly or ascites.   NEUROLOGIC: Unremarkable.   LABORATORY,  DIAGNOSTIC, AND RADIOLOGICAL DATA: BUN, creatinine, electrolytes, and liver enzymes are normal. Troponin is now 0.07. It was 0.08 on admission. Hemoglobin 11.4, hematocrit 34.2, MCV normal at 95. White cell count is normal at 5.   ASSESSMENT AND PLAN: The patient is with chronic nausea usually after meals associated with bloating and gas with  extensive work-up being negative, highly consistent with nonulcer dyspepsia. Other possible diagnoses include food allergies and also certain endocrine disorders such as carcinoid, although the patient is not complaining of any diarrhea. She does complain of tingling all over her body after meals. Recent ultrasound was negative. An upper GI endoscopy was done a while ago and was unremarkable. The patient seems to be doing fairly well on Zofran as well as Protonix. I would recommend discharging her home on Zofran 3 times a day prior to meals. We will get in touch with the patient Monday and schedule her for an outpatient upper GI endoscopy as was planned earlier. If upper GI endoscopy is negative consideration will be given to a HIDA scan as well as further testing for food allergy. Plan has been discussed with the patient. She is in full agreement. The plan has been discussed with Dr. Humphrey Rolls as well who will  discharge the patient tomorrow morning. Our office will get in touch with her tomorrow morning with further recommendations. Thank you so much. We will sign off. Please do not hesitate to call me if I can be of any further assistance.   ____________________________ Jill Side, MD si:bjt D: 09/03/2011 10:02:27 ET T: 09/03/2011 10:26:31 ET JOB#: [id number]  cc: Jill Side, MD, <Dictator> Sheikh A. Elijio Miles, MD Jill Side MD ELECTRONICALLY SIGNED 09/05/2011 12:05

## 2014-11-08 NOTE — Consult Note (Signed)
Brief Consult Note: Diagnosis: Nausea.   Patient was seen by consultant.   Consult note dictated.   Orders entered.   Discussed with Attending MD.   Comments: Patient with persistant nausea after meals associated with bloating and tingling of entire body. Extensive workup has been negative in the past. She has failed several medicines given to her for suspected non ulcer dyspepsia. Feels better on Zofran.  Impression: Nausea, functional vs food allergies vs gallbladder dysfunction vs endocrine disorders such as carcinoid dyndrome although diarrhea is absent.  Recommendations: Continue Zofran as OP. Patient may be discharged as further workup will be done as OP including EGD +/- HIDA scan. I will call her Monday afternoon to setup thsese tests. Patient has been advised to stop using eggs for a week or two to see if she is allergic to eggs as they seem to cause more nausea and bloating. Will sign off and follow her as OP. Thanks.  Electronic Signatures: Jill Side (MD)  (Signed 17-Feb-13 09:56)  Authored: Brief Consult Note   Last Updated: 17-Feb-13 09:56 by Jill Side (MD)

## 2014-12-17 ENCOUNTER — Inpatient Hospital Stay: Payer: No Typology Code available for payment source | Attending: Oncology

## 2014-12-17 DIAGNOSIS — Z170 Estrogen receptor positive status [ER+]: Secondary | ICD-10-CM | POA: Diagnosis not present

## 2014-12-17 DIAGNOSIS — Z452 Encounter for adjustment and management of vascular access device: Secondary | ICD-10-CM | POA: Diagnosis not present

## 2014-12-17 DIAGNOSIS — C50911 Malignant neoplasm of unspecified site of right female breast: Secondary | ICD-10-CM | POA: Diagnosis present

## 2014-12-17 DIAGNOSIS — C801 Malignant (primary) neoplasm, unspecified: Secondary | ICD-10-CM

## 2014-12-17 DIAGNOSIS — Z79811 Long term (current) use of aromatase inhibitors: Secondary | ICD-10-CM | POA: Diagnosis not present

## 2014-12-17 MED ORDER — SODIUM CHLORIDE 0.9 % IJ SOLN
10.0000 mL | Freq: Once | INTRAMUSCULAR | Status: AC
  Administered 2014-12-17: 10 mL via INTRAVENOUS
  Filled 2014-12-17: qty 10

## 2014-12-17 MED ORDER — HEPARIN SOD (PORK) LOCK FLUSH 100 UNIT/ML IV SOLN
500.0000 [IU] | Freq: Once | INTRAVENOUS | Status: AC
  Administered 2014-12-17: 500 [IU] via INTRAVENOUS

## 2014-12-31 ENCOUNTER — Other Ambulatory Visit: Payer: Self-pay | Admitting: *Deleted

## 2014-12-31 ENCOUNTER — Encounter: Payer: Self-pay | Admitting: Radiation Oncology

## 2014-12-31 ENCOUNTER — Ambulatory Visit
Admission: RE | Admit: 2014-12-31 | Discharge: 2014-12-31 | Disposition: A | Discharge status: Home / Self Care | Payer: No Typology Code available for payment source | Source: Ambulatory Visit | Attending: Radiation Oncology | Admitting: Radiation Oncology

## 2014-12-31 VITALS — BP 124/81 | HR 91 | Temp 97.4°F | Resp 20 | Wt 192.1 lb

## 2014-12-31 DIAGNOSIS — C50911 Malignant neoplasm of unspecified site of right female breast: Secondary | ICD-10-CM

## 2014-12-31 MED ORDER — HYDROCORTISONE ACETATE 25 MG RE SUPP: 25.0000 mg | Freq: Two times a day (BID) | RECTAL | Status: DC | PRN

## 2014-12-31 MED ORDER — AZITHROMYCIN 250 MG PO TABS: ORAL_TABLET | ORAL | Status: DC

## 2014-12-31 NOTE — Progress Notes (Signed)
Radiation Oncology Follow up Note  Name: Abigail Ortega   Date:   12/31/2014 MRN:  [id number] DOB: 10-Mar-1951    This 64 y.o. female presents to the clinic today for follow-up for stage IIIa breast cancer ER/PR positive HER-2/neu overexpressed status post modified radical mastectomy with adjuvant chemotherapy plus Herceptin plus chest wall and peripheral lymphatic radiation.  REFERRING PROVIDER: Perrin Maltese, MD  HPI: Patient is a 64 year old female now out 1 year having completed peripheral lymphatic and chest wall radiation to her right chest status post right modified radical mastectomy for stage IIIa invasive mammary carcinoma ER/PR positive HER-2/neu not overexpressed. She is seen today in routine follow-up and is doing well. She does have a chest cold which is bothering her she's asked for some anabiotic therapy. She is also being followed at [facility] for some rectal itching and burning no specific therapy has been given for that. She specifically denies any new chest wall masses or nodularity cough or bone pain.. Patient is currently on aromatase inhibitor therapy tolerating that well without side effect.  COMPLICATIONS OF TREATMENT: none  FOLLOW UP COMPLIANCE: keeps appointments   PHYSICAL EXAM:  BP 124/81 mmHg  Pulse 91  Temp(Src) 97.4 F (36.3 C)  Resp 20  Wt 192 lb 2.1 oz (87.15 kg) Well-developed female in NAD she is status post right modified radical mastectomy with incision healed well. No evidence of chest wall mass or nodularity is noted. Left breast is free of dominant mass or nodularity in 2 positions examined. No axillary or supraclavicular adenopathy is identified. No evidence of lymphedema in her right upper extremity is noted. Well-developed well-nourished patient in NAD. HEENT reveals PERLA, EOMI, discs not visualized.  Oral cavity is clear. No oral mucosal lesions are identified. Neck is clear without evidence of cervical or supraclavicular adenopathy. Lungs are  clear to A&P. Cardiac examination is essentially unremarkable with regular rate and rhythm without murmur rub or thrill. Abdomen is benign with no organomegaly or masses noted. Motor sensory and DTR levels are equal and symmetric in the upper and lower extremities. Cranial nerves II through XII are grossly intact. Proprioception is intact. No peripheral adenopathy or edema is identified. No motor or sensory levels are noted. Crude visual fields are within normal range.   RADIOLOGY RESULTS: No recent radiology reports are available for review.   Plan: At the present time she continues to do well with no evidence of disease. I am starting her on a Z-Pak for her chest cold. Also prescribing rectal suppositories with 2% hydrocortisone for her rectal itching and burning. I've asked to see her back in 1 year for follow-up. She continues on aromatase inhibitor therapy without side effect. Patient is to call with any concerns.  I would like to take this opportunity for allowing me to participate in the care of your patient.Armstead Peaks., MD

## 2015-01-13 DIAGNOSIS — K6289 Other specified diseases of anus and rectum: Secondary | ICD-10-CM | POA: Insufficient documentation

## 2015-01-13 HISTORY — DX: Other specified diseases of anus and rectum: K62.89

## 2015-01-26 ENCOUNTER — Ambulatory Visit: Payer: No Typology Code available for payment source | Attending: Surgery | Admitting: Physical Therapy

## 2015-01-26 ENCOUNTER — Encounter: Payer: Self-pay | Admitting: Physical Therapy

## 2015-01-26 VITALS — BP 110/78

## 2015-01-26 DIAGNOSIS — M609 Myositis, unspecified: Secondary | ICD-10-CM | POA: Insufficient documentation

## 2015-01-26 DIAGNOSIS — IMO0001 Reserved for inherently not codable concepts without codable children: Secondary | ICD-10-CM

## 2015-01-26 DIAGNOSIS — R2689 Other abnormalities of gait and mobility: Secondary | ICD-10-CM | POA: Insufficient documentation

## 2015-01-26 DIAGNOSIS — R269 Unspecified abnormalities of gait and mobility: Secondary | ICD-10-CM | POA: Insufficient documentation

## 2015-01-26 DIAGNOSIS — M791 Myalgia: Secondary | ICD-10-CM | POA: Insufficient documentation

## 2015-01-26 NOTE — Patient Instructions (Addendum)
Wear belt below navel for support back and pelvis Stand with tailbone down (tall) not slumped) Sit on sitting bones, feet under knees. In regular chair between commericials, less time in recliner. If you feel tailbone when sitting regular chair, use ice pack.    Walk in place during every commercial while watching TV

## 2015-01-28 NOTE — Therapy (Signed)
[place] MAIN [facility] SERVICES [address] [place], [place], [address] Phone: [phone]   Fax:  [phone]  Physical Therapy Evaluation  Patient Details  Name: Abigail Ortega MRN: [id number] Date of Birth: 06/15/1951 Referring Provider:  Windell Hummingbird, MD  Encounter Date: 01/26/2015      PT End of Session - 01/27/15 2339    Visit Number 1   Number of Visits 12   Date for PT Re-Evaluation 04/21/15   PT Start Time 1330   PT Stop Time 1510   PT Time Calculation (min) 100 min   Activity Tolerance Patient tolerated treatment well;No increased pain  Reported 4/10 post-Tx   Behavior During Therapy [facility] for tasks assessed/performed      Past Medical History  Diagnosis Date  . CHF (congestive heart failure) 2013  . IBS (irritable bowel syndrome)   . Thyroid disease   . Hemorrhoids   . Clotting disorder     blood clots  . Heart attack 2012    coronary stent x2 completed by Dr. Clayborn Bigness. 64  . Malignant neoplasm of lower-inner quadrant of female breast January 20, 2013.    invasive mammary cancer, minimal 0.85 cm.  Histologic grade 1.  . Heart attack Apr 29, 2013    Past Surgical History  Procedure Laterality Date  . Vascular surgery    . Colonoscopy w/ biopsies  09/15/2010    Colonoscopy completed by Verdie Shire, M.D. Tubular adenoma of the ascending colon and descending colon reported up to 0.4 cm in diameter. No atypia.  Marland Kitchen Upper gi endoscopy  09/15/2010      Completed by Verdie Shire, M.D. for nausea. Normal exam reported.  . Breast surgery Right 02-21-2013    right mastectomy    Filed Vitals:   01/26/15 1347  BP: 110/78    Visit Diagnosis:  Myalgia and myositis - Plan: PT plan of care cert/re-cert  Decreased mobility - Plan: PT plan of care cert/re-cert  Abnormality of gait - Plan: PT plan of care cert/re-cert      Subjective Assessment - 01/28/15 0018    Subjective Since Oct 2015,  pt has experienced anal pain that is  described as if "she has hemorrhoids" with achiness, burning, nagging sensation (8-9/10) on the inside of her rectum. Pain limits her from sitting in hard chairs for > 15 min. Pt shifts to her L/ R buttocks. She uses ice pack/ seat cushion for pain relief and for driving and sitting at church for 3-4 hrs. Pain also occurs upon waking but not with bowel movements. Pt feels she is not able to completely empty bowel and urine.  Pt takes a fiber drink and Miralax ( 1x/ day.)  Frequency of bowel movement: 1-2x/day, daily. Bristol Stool Scale 4. Since last Oct, her Sx have improved 50% and has been able to lay on her back and sleep. Pt uses Ibprofen 2x/daily/ 7 week for anal pain which decreases pain from 8-9/10 to 6/10.     Pertinent History Hx of Breast Cancer (chemo, radiation 2015), Hx of anal fissure 2015 that naturally healed, hemorrhoids,  Hx of 3 vaginal deliveries with epsiotomies (65#, 9#, 10#).  Hx of fall on tailbone after stool collapsed 1996 . Pt reports she spends alot of time in her recliner, reclined back with feet propped up watching TV.    Limitations Sitting  sitting for long period causes her headaches    How long can you sit comfortably? 15 min   Patient  Stated Goals feeling better, sitting without ice pack, get back normal (active visiting friends and grandchildren via 3hr road trips)    Currently in Pain? Yes   Pain Score 8    Pain Location Rectum            [facility] PT Assessment - 01/27/15 2253    Assessment   Medical Diagnosis anal pain    Precautions   Precautions None   Restrictions   Weight Bearing Restrictions No   Prior Function   Level of Independence Independent   Observation/Other Assessments   Other Surveys  --  Female NIH-CPSI:97%, CRADI-8: 47% (lower %=better function)    Coordination   Gross Motor Movements are Fluid and Coordinated --  lumbopelvic instability w/ movement of LE during assessments   Single Leg Stance   Comments 4 sec L, 2 sec w/ difficulty    Posture/Postural Control   Posture/Postural Control --  slumped sitting, cross feet under chair,    Posture Comments decreasedanal pain w/ proper sitting cues, decreased back pain w/ proper standing cues   ROM / Strength   AROM / PROM / Strength --  Sidebend/rotation L limited by R back pain   AROM   Overall AROM Comments hinging at L1/2, increased anterior lordosis   PROM   Overall PROM  --  hip IR supine 90/90 bilateral WNL   Palpation   SI assessment  concordant pain @ L PSIS, L PSIS more posterior in supine, L malleolus shorter > R   moving ankles together caused tailbone pain    Palpation comment increased midback/ paraspinal mm tensions, hypomobile thoracic spinal segments  tensions at L sacral tub ligament/coccgyeus, R superior glut   Special Tests   Sacroiliac Tests  --  Fabers neg bilateral                   [facility] Adult PT Treatment/Exercise - 01/27/15 2253    Ambulation/Gait   Gait Comments anterior tilt of pelvis, slowed, slight lateral trunk sway  decreased hip ext bil, decreased heel strike/DF, swing   Self-Care   Self-Care --  education on decrease time in recliner   Therapeutic Activites    Therapeutic Activities --  donning SIJ belt (pt demo'd IND)    Neuro Re-ed    Neuro Re-ed Details  proper sitting mechanics to offload tailbone, relax back mm, proper standing posture (unlock knees)  propioception training to find neutral pelvis   Manual Therapy   Manual therapy comments pre-Tx: L PSIS pain w/ palpation, post-Tx: centralization    Joint Mobilization distraction of BLE, Grade II at PSIS L PA mobs 90 sec    Soft tissue mobilization sustained pressure/ myofascial release along R gluts, L coccygeus    Other Manual Therapy light manual therapy cranial/sacrum   decreased headache, decreased sensitivity at L PSIS  post-Tx                PT Education - 01/27/15 2336    Education provided Yes   Education Details HEP, POC,  anatomy/physiology, goals, role of posture/ sitting in recliner on pressure on tailbone and ways to improve anal and back pain   Person(s) Educated Patient   Methods Explanation;Demonstration;Tactile cues;Verbal cues;Handout   Comprehension Verbalized understanding;Returned demonstration             PT Long Term Goals - 01/28/15 0008    PT LONG TERM GOAL #1   Title Pt will report being able to sit for 30 min without  an ice pack in order to visit friends.    Time 12   Period Weeks   Status New   PT LONG TERM GOAL #2   Title Pt will report being able to completely empty bowels every day for 1 week in order to return to ADLs.   Time 12   Period Weeks   Status New   PT LONG TERM GOAL #3   Title Pt will report  decreased back pain w/ sidebend L in order to pick up groceries.    Time 12   Period Weeks   Status New   PT LONG TERM GOAL #4   Title Pt will decrease her score on NIH-CPSI from 97% to < 50% in order to demo improved QOL.    Time 12   Period Weeks   Status New   PT LONG TERM GOAL #5   Title Pt will decrease her score on CRADI-8 from 47%  to < 30% in order to demo improved function.   Time 12   Period Weeks   Status New               Plan - 01/27/15 2342    Clinical Impression Statement Pt is a 64 yo female whose S & Sx consist  of poor postural awareness, increased mm tensions in lumbopelvic area,  limited spinal mobility, deconditioning, functional hip weakness, weak deep core mm, and gait deviations. These deficits limit pt from sitting for long periods and to completely emptying her bowels and urine. Pain was  reduced by 50% w/ external manual Tx at SIJ,  postural neuro-re-edu, and SIJ belt. Suspect pt's Sx are strongly attributed to her co-morbidities/surgeries/gynecological Hx and also pt's sedentary lifestyle and her preference for  sitting in her recliner for hours while watching TV.  Internal pelvic floor assessment will occur at next session.     Pt  will benefit from skilled therapeutic intervention in order to improve on the following deficits Abnormal gait;Decreased activity tolerance;Decreased balance;Decreased mobility;Decreased strength;Postural dysfunction;Improper body mechanics;Hypomobility;Pain;Hypermobility;Increased muscle spasms;Obesity;Difficulty walking;Decreased range of motion;Decreased coordination;Decreased endurance;Decreased safety awareness;Increased fascial restricitons;Decreased scar mobility   Rehab Potential Good   Clinical Impairments Affecting Rehab Potential co-morbidities, sedentary lifestyle   PT Frequency 1x / week   PT Duration 12 weeks   PT Treatment/Interventions ADLs/Self Care Home Management;Aquatic Therapy;Functional mobility training;Stair training;Gait training;Biofeedback;Traction;Moist Heat;Therapeutic activities;Therapeutic exercise;Balance training;Neuromuscular re-education;Patient/family education;Passive range of motion;Scar mobilization;Manual techniques;Dry needling;Energy conservation;Taping;Cryotherapy;Electrical Stimulation;Other (comment)  pain science education   PT Next Visit Plan internal assessment   Consulted and Agree with Plan of Care Patient         Problem List Patient Active Problem List   Diagnosis Date Noted  . Acquired absence of breast 03/04/2013  . Congestive heart failure 03/04/2013  . Malignant neoplasm of lower-inner quadrant of female breast 03/04/2013  . Breast cancer of lower-inner quadrant of right female breast 01/28/2013    Jerl Mina ,PT, DPT, E-RYT  01/28/2015, 12:21 AM  [place] MAIN [facility] SERVICES [address] [place], [place], [address] Phone: [phone]   Fax:  [phone]

## 2015-02-02 ENCOUNTER — Ambulatory Visit: Payer: No Typology Code available for payment source | Admitting: Physical Therapy

## 2015-02-02 DIAGNOSIS — IMO0001 Reserved for inherently not codable concepts without codable children: Secondary | ICD-10-CM

## 2015-02-02 DIAGNOSIS — R269 Unspecified abnormalities of gait and mobility: Secondary | ICD-10-CM

## 2015-02-02 DIAGNOSIS — R2689 Other abnormalities of gait and mobility: Secondary | ICD-10-CM

## 2015-02-02 NOTE — Patient Instructions (Signed)
Reverse kegel handout with downdog/L sidestretch on chair  Proper sitting posture on toilet and breathing for improved elimination

## 2015-02-02 NOTE — Therapy (Signed)
[place] MAIN [facility] SERVICES [address] [place], [place], [address] Phone: [phone]   Fax:  [phone]  Physical Therapy Treatment  Patient Details  Name: Abigail Ortega MRN: [id number] Date of Birth: 1950-08-06 Referring Provider:  Perrin Maltese, MD  Encounter Date: 02/02/2015      PT End of Session - 02/02/15 1453    Visit Number 2   Number of Visits 12   Date for PT Re-Evaluation 04/21/15   PT Start Time 1400   PT Stop Time 1435   PT Time Calculation (min) 35 min   Activity Tolerance Patient tolerated treatment well;No increased pain  Reported 4/10 post-Tx   Behavior During Therapy [facility] for tasks assessed/performed      Past Medical History  Diagnosis Date  . CHF (congestive heart failure) 2013  . IBS (irritable bowel syndrome)   . Thyroid disease   . Hemorrhoids   . Clotting disorder     blood clots  . Heart attack 2012    coronary stent x2 completed by Dr. Clayborn Bigness.  . Malignant neoplasm of lower-inner quadrant of female breast January 20, 2013.    invasive mammary cancer, minimal 0.85 cm.  Histologic grade 1.  . Heart attack Apr 29, 2013    Past Surgical History  Procedure Laterality Date  . Vascular surgery    . Colonoscopy w/ biopsies  09/15/2010    Colonoscopy completed by Verdie Shire, M.D. Tubular adenoma of the ascending colon and descending colon reported up to 0.4 cm in diameter. No atypia.  Marland Kitchen Upper gi endoscopy  09/15/2010      Completed by Verdie Shire, M.D. for nausea. Normal exam reported.  . Breast surgery Right 02-21-2013    right mastectomy    There were no vitals filed for this visit.  Visit Diagnosis:  Myalgia and myositis  Abnormality of gait  Decreased mobility      Subjective Assessment - 02/02/15 1402    Subjective Pt reported she has practiced moving out of her recliner during TV commericals. Pt continues to feel pain around the rectum and notices it hurting when standing after sitting.  This Sx is new to her. Pt reported her back had increased pain after last session but icing helped.  Her back continues to hurt on her R side when standing.   Pertinent History Hx of Breast Cancer (chemo, radiation 2015), Hx of anal fissure 2015 that naturally healed, hemorrhoids,  Hx of 3 vaginal deliveries with epsiotomies (8#, 9#, 10#).  Hx of fall on tailbone after stool collapsed 1996 . Pt reports she spends alot of time in her recliner, reclined back with feet propped up watching TV.    Limitations Sitting  sitting for long period causes her headaches    How long can you sit comfortably? 15 min   Patient Stated Goals feeling better, sitting without ice pack, get back normal (active visiting friends and grandchildren via 3hr road trips)                       Pelvic Floor Special Questions - 02/02/15 1423    Exam Type Rectal   Palpation Noted L puborectalis (6 o clock-coccyx direction)  increased mm tensions/ tenderness  Pt reported no burning in rectum post Tx           [facility] Adult PT Treatment/Exercise - 02/02/15 1450    Therapeutic Activites    ADL's proper toileting posture   Exercises  Other Exercises  reverse kegel, down dog over back of chair, right side stretch in down dog    Manual Therapy   Internal Pelvic Floor rectal mm releases (fascial, sustained pressure on L and posterior puborectalis)                PT Education - 02/02/15 1453    Education provided Yes   Education Details HEP   Person(s) Educated Patient   Methods Explanation;Demonstration;Tactile cues;Verbal cues;Handout   Comprehension Verbalized understanding;Returned demonstration             PT Long Term Goals - 01/28/15 0008    PT LONG TERM GOAL #1   Title Pt will report being able to sit for 30 min without an ice pack in order to visit friends.    Time 12   Period Weeks   Status New   PT LONG TERM GOAL #2   Title Pt will report being able to completely empty bowels  every day for 1 week in order to return to ADLs.   Time 12   Period Weeks   Status New   PT LONG TERM GOAL #3   Title Pt will report  decreased back pain w/ sidebend L in order to pick up groceries.    Time 12   Period Weeks   Status New   PT LONG TERM GOAL #4   Title Pt will decrease her score on NIH-CPSI from 97% to < 50% in order to demo improved QOL.    Time 12   Period Weeks   Status New   PT LONG TERM GOAL #5   Title Pt will decrease her score on CRADI-8 from 47%  to < 30% in order to demo improved function.   Time 12   Period Weeks   Status New               Plan - 02/02/15 1453    Clinical Impression Statement Pt responded well to internal rectal mm releases today with no c/o burning sensation post-Tx. Pt demo'd proper pelvic floor coordination and ROM today.  Pt shows compliance and anticipate pt will progress towards her goals. Pt will continue to benefit from skilled PT in order to address by pelvic floor and LBP by correcting her forward lean and increased lumbar lordosis.    Pt will benefit from skilled therapeutic intervention in order to improve on the following deficits Abnormal gait;Decreased activity tolerance;Decreased balance;Decreased mobility;Decreased strength;Postural dysfunction;Improper body mechanics;Hypomobility;Pain;Hypermobility;Increased muscle spasms;Obesity;Difficulty walking;Decreased range of motion;Decreased coordination;Decreased endurance;Decreased safety awareness;Increased fascial restricitons;Decreased scar mobility   Rehab Potential Good   Clinical Impairments Affecting Rehab Potential co-morbidities, sedentary lifestyle   PT Frequency 1x / week   PT Duration 12 weeks   PT Treatment/Interventions ADLs/Self Care Home Management;Aquatic Therapy;Functional mobility training;Stair training;Gait training;Biofeedback;Traction;Moist Heat;Therapeutic activities;Therapeutic exercise;Balance training;Neuromuscular re-education;Patient/family  education;Passive range of motion;Scar mobilization;Manual techniques;Dry needling;Energy conservation;Taping;Cryotherapy;Electrical Stimulation;Other (comment)  pain science education   PT Next Visit Plan lumbopelvic alignment   Consulted and Agree with Plan of Care Patient        Problem List Patient Active Problem List   Diagnosis Date Noted  . Acquired absence of breast 03/04/2013  . Congestive heart failure 03/04/2013  . Malignant neoplasm of lower-inner quadrant of female breast 03/04/2013  . Breast cancer of lower-inner quadrant of right female breast 01/28/2013    Jerl Mina  ,PT, DPT, E-RYT  02/02/2015, 2:58 PM  [place] MAIN [facility] SERVICES [place], [place], [address] Phone: [phone]   Fax:  [phone]

## 2015-02-02 NOTE — Therapy (Signed)
[place] MAIN [facility] SERVICES [address] [place], [place], [address] Phone: [phone]   Fax:  [phone]  Physical Therapy Treatment  Patient Details  Name: Abigail Ortega MRN: [id number] Date of Birth: Sep 30, 1950 Referring Provider:  Dr. Audie Clear  Encounter Date: 02/02/2015      PT End of Session - 02/02/15 1453    Visit Number 2   Number of Visits 12   Date for PT Re-Evaluation 04/21/15   PT Start Time 1400   PT Stop Time 1435   PT Time Calculation (min) 35 min   Activity Tolerance Patient tolerated treatment well;No increased pain  Reported 4/10 post-Tx   Behavior During Therapy [facility] for tasks assessed/performed      Past Medical History  Diagnosis Date  . CHF (congestive heart failure) 2013  . IBS (irritable bowel syndrome)   . Thyroid disease   . Hemorrhoids   . Clotting disorder     blood clots  . Heart attack 2012    coronary stent x2 completed by Dr. Clayborn Bigness.  . Malignant neoplasm of lower-inner quadrant of female breast January 20, 2013.    invasive mammary cancer, minimal 0.85 cm.  Histologic grade 1.  . Heart attack Apr 29, 2013    Past Surgical History  Procedure Laterality Date  . Vascular surgery    . Colonoscopy w/ biopsies  09/15/2010    Colonoscopy completed by Verdie Shire, M.D. Tubular adenoma of the ascending colon and descending colon reported up to 0.4 cm in diameter. No atypia.  Marland Kitchen Upper gi endoscopy  09/15/2010      Completed by Verdie Shire, M.D. for nausea. Normal exam reported.  . Breast surgery Right 02-21-2013    right mastectomy    There were no vitals filed for this visit.  Visit Diagnosis:  Myalgia and myositis  Abnormality of gait  Decreased mobility      Subjective Assessment - 02/02/15 1402    Subjective Pt reported she has practiced moving out of her recliner during commericals. Pt continues to feel pain around the rectum and notices it hurting when standing after sitting. This Sx is new  to her. Pt reported her back pain increased after last session but ice helped it. Her back continues to hurt.    Pertinent History Hx of Breast Cancer (chemo, radiation 2015), Hx of anal fissure 2015 that naturally healed, hemorrhoids,  Hx of 3 vaginal deliveries with epsiotomies (8#, 9#, 10#).  Hx of fall on tailbone after stool collapsed 1996 . Pt reports she spends alot of time in her recliner, reclined back with feet propped up watching TV.    Limitations Sitting  sitting for long period causes her headaches    How long can you sit comfortably? 15 min   Patient Stated Goals feeling better, sitting without ice pack, get back normal (active visiting friends and grandchildren via 3hr road trips)                       Pelvic Floor Special Questions - 02/02/15 1423    Exam Type Rectal   Palpation Noted L puborectalis (6 o clock-coccyx direction)  increased mm tensions/ tenderness  Pt reported no burning in rectum post Tx           [facility] Adult PT Treatment/Exercise - 02/02/15 1450    Therapeutic Activites    ADL's proper toileting posture   Exercises   Other Exercises  reverse kegel, down dog over  back of chair, right side stretch in down dog    Manual Therapy   Internal Pelvic Floor rectal mm releases (fascial, sustained pressure on L and posterior puborectalis)                PT Education - 02/02/15 1453    Education provided Yes   Education Details HEP   Person(s) Educated Patient   Methods Explanation;Demonstration;Tactile cues;Verbal cues;Handout   Comprehension Verbalized understanding;Returned demonstration             PT Long Term Goals - 01/28/15 0008    PT LONG TERM GOAL #1   Title Pt will report being able to sit for 30 min without an ice pack in order to visit friends.    Time 12   Period Weeks   Status New   PT LONG TERM GOAL #2   Title Pt will report being able to completely empty bowels every day for 1 week in order to return to  ADLs.   Time 12   Period Weeks   Status New   PT LONG TERM GOAL #3   Title Pt will report  decreased back pain w/ sidebend L in order to pick up groceries.    Time 12   Period Weeks   Status New   PT LONG TERM GOAL #4   Title Pt will decrease her score on NIH-CPSI from 97% to < 50% in order to demo improved QOL.    Time 12   Period Weeks   Status New   PT LONG TERM GOAL #5   Title Pt will decrease her score on CRADI-8 from 47%  to < 30% in order to demo improved function.   Time 12   Period Weeks   Status New               Plan - 02/02/15 1453    Clinical Impression Statement Pt responded well to internal rectal releases today with no c/o burning sensation post-Tx. Pt demo'd proper pelvic floor coordination and ROM today.  Pt shows compliance and anticipate pt will progress towards her goals. Pt will continue to benefit from skilled PT.    Pt will benefit from skilled therapeutic intervention in order to improve on the following deficits Abnormal gait;Decreased activity tolerance;Decreased balance;Decreased mobility;Decreased strength;Postural dysfunction;Improper body mechanics;Hypomobility;Pain;Hypermobility;Increased muscle spasms;Obesity;Difficulty walking;Decreased range of motion;Decreased coordination;Decreased endurance;Decreased safety awareness;Increased fascial restricitons;Decreased scar mobility   Rehab Potential Good   Clinical Impairments Affecting Rehab Potential co-morbidities, sedentary lifestyle   PT Frequency 1x / week   PT Duration 12 weeks   PT Treatment/Interventions ADLs/Self Care Home Management;Aquatic Therapy;Functional mobility training;Stair training;Gait training;Biofeedback;Traction;Moist Heat;Therapeutic activities;Therapeutic exercise;Balance training;Neuromuscular re-education;Patient/family education;Passive range of motion;Scar mobilization;Manual techniques;Dry needling;Energy conservation;Taping;Cryotherapy;Electrical Stimulation;Other  (comment)  pain science education   PT Next Visit Plan lumbopelvic alignment   Consulted and Agree with Plan of Care Patient        Problem List Patient Active Problem List   Diagnosis Date Noted  . Acquired absence of breast 03/04/2013  . Congestive heart failure 03/04/2013  . Malignant neoplasm of lower-inner quadrant of female breast 03/04/2013  . Breast cancer of lower-inner quadrant of right female breast 01/28/2013    Jerl Mina ,PT, DPT, E-RYT  02/02/2015, 3:06 PM  [place] MAIN [facility] SERVICES [address] [place], [place], [address] Phone: [phone]   Fax:  [phone]

## 2015-02-04 ENCOUNTER — Other Ambulatory Visit: Payer: Self-pay

## 2015-02-04 ENCOUNTER — Inpatient Hospital Stay: Payer: No Typology Code available for payment source

## 2015-02-04 ENCOUNTER — Inpatient Hospital Stay: Payer: No Typology Code available for payment source | Admitting: Oncology

## 2015-02-09 ENCOUNTER — Ambulatory Visit: Payer: No Typology Code available for payment source | Admitting: Physical Therapy

## 2015-02-09 DIAGNOSIS — R2689 Other abnormalities of gait and mobility: Secondary | ICD-10-CM

## 2015-02-09 DIAGNOSIS — IMO0001 Reserved for inherently not codable concepts without codable children: Secondary | ICD-10-CM

## 2015-02-09 DIAGNOSIS — R269 Unspecified abnormalities of gait and mobility: Secondary | ICD-10-CM

## 2015-02-10 NOTE — Therapy (Signed)
[place] MAIN [facility] SERVICES [address] [place], [place], [address] Phone: [phone]   Fax:  [phone]  Physical Therapy Treatment  Patient Details  Name: Abigail Ortega MRN: [id number] Date of Birth: 1950/09/08 Referring Provider:  Windell Hummingbird, MD  Encounter Date: 02/09/2015      PT End of Session - 02/09/15 1432    Visit Number 2   Number of Visits 12   Date for PT Re-Evaluation 04/21/15   Activity Tolerance Patient tolerated treatment well;No increased pain  Reported 3/10 post-Tx   Behavior During Therapy [facility] for tasks assessed/performed      Past Medical History  Diagnosis Date  . CHF (congestive heart failure) 2013  . IBS (irritable bowel syndrome)   . Thyroid disease   . Hemorrhoids   . Clotting disorder     blood clots  . Heart attack 2012    coronary stent x2 completed by Dr. Clayborn Bigness.  . Malignant neoplasm of lower-inner quadrant of female breast January 20, 2013.    invasive mammary cancer, minimal 0.85 cm.  Histologic grade 1.  . Heart attack Apr 29, 2013    Past Surgical History  Procedure Laterality Date  . Vascular surgery    . Colonoscopy w/ biopsies  09/15/2010    Colonoscopy completed by Verdie Shire, M.D. Tubular adenoma of the ascending colon and descending colon reported up to 0.4 cm in diameter. No atypia.  Marland Kitchen Upper gi endoscopy  09/15/2010      Completed by Verdie Shire, M.D. for nausea. Normal exam reported.  . Breast surgery Right 02-21-2013    right mastectomy    There were no vitals filed for this visit.  Visit Diagnosis:  Myalgia and myositis  Abnormality of gait  Decreased mobility      Subjective Assessment - 02/09/15 1329    Subjective Pt has been able to sit for 1-2 hrs. Pt has been doing HEP but found her back stretches caused tingling down her RLE. Pt feels her back is giving her more problem than her rectum pain.    Pertinent History Hx of Breast Cancer (chemo, radiation 2015), Hx  of anal fissure 2015 that naturally healed, hemorrhoids,  Hx of 3 vaginal deliveries with epsiotomies (8#, 9#, 10#).  Hx of fall on tailbone after stool collapsed 1996 . Pt reports she spends alot of time in her recliner, reclined back with feet propped up watching TV.    Limitations Sitting  sitting for long period causes her headaches    How long can you sit comfortably? 15 min   Patient Stated Goals feeling better, sitting without ice pack, get back normal (active visiting friends and grandchildren via 3hr road trips)    Currently in Pain? Yes   Pain Score 5    Pain Location Rectum   Pain Score 7  sitting 0/10, standing pain begins 7/10 and gets progressively worst w/ activities   Pain Location Back   Pain Orientation Right                      Pelvic Floor Special Questions - 02/09/15 1402    Exam Type Rectal   Palpation Noted decreased L puborectalis (6 o clock-coccyx direction)  increased mm tensions/ tenderness  reported 10/10 postTx L sidelying, 3/10 post Tx L sidelying           [facility] Adult PT Treatment/Exercise - 02/09/15 1404    Posture/Postural Control   Posture Comments proper  sitting posture w/o cuing   Self-Care   Self-Care Other Self-Care Comments   Other Self-Care Comments  guided body scan w/ moist heat on coccyx area 5 min. Skin intact post-Tx  pt performed 1 cycle body scan IND   Therapeutic Activites    ADL's small pelvic tilts in sidelying  10x   Neuro Re-ed    Neuro Re-ed Details  sidelying minor pelvic tilts   Manual Therapy   Joint Mobilization sustained sacral inferior mob  post-Tx 3/10   Soft tissue mobilization R sacrotuberous ligament proximal > distal (increased tensions)   decreased pain post-Tx 3/10   Internal Pelvic Floor rectal mm releases (fascial, sustained pressure on L and posterior puborectalis)  decreased pain post-Tx 3/10                     PT Long Term Goals - 02/10/15 1523    PT LONG TERM GOAL #1    Title Pt will report being able to sit for 30 min without an ice pack in order to visit friends.    Time 12   Period Weeks   Status Achieved   PT LONG TERM GOAL #2   Title Pt will report being able to completely empty bowels every day for 1 week in order to return to ADLs.   Time 12   Period Weeks   Status On-going   PT LONG TERM GOAL #3   Title Pt will report  decreased back pain w/ sidebend L in order to pick up groceries.    Time 12   Period Weeks   Status On-going   PT LONG TERM GOAL #4   Title Pt will decrease her score on NIH-CPSI from 97% to < 50% in order to demo improved QOL.    Time 12   Period Weeks   Status On-going   PT LONG TERM GOAL #5   Title Pt will decrease her score on CRADI-8 from 47%  to < 30% in order to demo improved function.   Time 12   Period Weeks   Status New               Plan - 02/10/15 1453    Clinical Impression Statement Pt responded to manual Tx today with increased pain 10/10 after sustained/thiele massage on puborectalis intrarectally but pain subsided to 4/76 following application of heat, small pelvic tilts,  soft-tissue mobilization along sacrotuberous, and guided body scan.  Pt will continue to benefit from biopsychosocial approaches.  Pt's frequency has been decreased to 1x week per pt's request for financial reasons. Anticipate pt will progress well as she remains compliant to HEP.      Pt will benefit from skilled therapeutic intervention in order to improve on the following deficits Abnormal gait;Decreased activity tolerance;Decreased balance;Decreased mobility;Decreased strength;Postural dysfunction;Improper body mechanics;Hypomobility;Pain;Hypermobility;Increased muscle spasms;Obesity;Difficulty walking;Decreased range of motion;Decreased coordination;Decreased endurance;Decreased safety awareness;Increased fascial restricitons;Decreased scar mobility   Rehab Potential Good   Clinical Impairments Affecting Rehab Potential  co-morbidities, sedentary lifestyle   PT Frequency 1x / week   PT Duration 12 weeks   PT Treatment/Interventions ADLs/Self Care Home Management;Aquatic Therapy;Functional mobility training;Stair training;Gait training;Biofeedback;Traction;Moist Heat;Therapeutic activities;Therapeutic exercise;Balance training;Neuromuscular re-education;Patient/family education;Passive range of motion;Scar mobilization;Manual techniques;Dry needling;Energy conservation;Taping;Cryotherapy;Electrical Stimulation;Other (comment)  pain science education   PT Next Visit Plan lumbopelvic alignment and dynamic stabilizationstrengthening   Consulted and Agree with Plan of Care Patient        Problem List Patient Active Problem List   Diagnosis Date Noted  .  Acquired absence of breast 03/04/2013  . Congestive heart failure 03/04/2013  . Malignant neoplasm of lower-inner quadrant of female breast 03/04/2013  . Breast cancer of lower-inner quadrant of right female breast 01/28/2013    Jerl Mina  ,PT, DPT, E-RYT  02/10/2015, 3:24 PM  [place] MAIN [facility] SERVICES [address] [place], [place], [address] Phone: [phone]   Fax:  [phone]

## 2015-02-15 ENCOUNTER — Inpatient Hospital Stay: Payer: No Typology Code available for payment source

## 2015-02-15 ENCOUNTER — Inpatient Hospital Stay: Payer: No Typology Code available for payment source | Admitting: Oncology

## 2015-02-16 ENCOUNTER — Encounter: Payer: No Typology Code available for payment source | Admitting: Physical Therapy

## 2015-02-18 ENCOUNTER — Ambulatory Visit: Payer: Medicare Other | Attending: Surgery | Admitting: Physical Therapy

## 2015-02-18 DIAGNOSIS — R2689 Other abnormalities of gait and mobility: Secondary | ICD-10-CM | POA: Insufficient documentation

## 2015-02-18 DIAGNOSIS — R269 Unspecified abnormalities of gait and mobility: Secondary | ICD-10-CM | POA: Insufficient documentation

## 2015-02-18 DIAGNOSIS — M609 Myositis, unspecified: Secondary | ICD-10-CM | POA: Insufficient documentation

## 2015-02-18 DIAGNOSIS — M791 Myalgia: Secondary | ICD-10-CM | POA: Insufficient documentation

## 2015-02-18 DIAGNOSIS — IMO0001 Reserved for inherently not codable concepts without codable children: Secondary | ICD-10-CM

## 2015-02-18 NOTE — Patient Instructions (Addendum)
Discontinue using the sponge seat at church, decrease use of ice pack (only when hurting not for habit)  Figure 4 seated stretch Passive neck ROM while laying down in the morning

## 2015-02-19 NOTE — Therapy (Signed)
[place] MAIN [facility] SERVICES [address] [place], [place], [address] Phone: [phone]   Fax:  [phone]  Physical Therapy Treatment  Patient Details  Name: Abigail Ortega MRN: [id number] Date of Birth: 1950/12/01 Referring Provider:  Windell Hummingbird, MD  Encounter Date: 02/18/2015      PT End of Session - 02/19/15 0900    Visit Number 3   Number of Visits 12   Date for PT Re-Evaluation 04/21/15   PT Start Time 1400   PT Stop Time 1505   PT Time Calculation (min) 65 min   Activity Tolerance Patient tolerated treatment well;No increased pain  Reported decreased pain/tenderness upon palpation in rectal mm    Behavior During Therapy [facility] for tasks assessed/performed      Past Medical History  Diagnosis Date  . CHF (congestive heart failure) 2013  . IBS (irritable bowel syndrome)   . Thyroid disease   . Hemorrhoids   . Clotting disorder     blood clots  . Heart attack 2012    coronary stent x2 completed by Dr. Clayborn Bigness.  . Malignant neoplasm of lower-inner quadrant of female breast January 20, 2013.    invasive mammary cancer, minimal 0.85 cm.  Histologic grade 1.  . Heart attack Apr 29, 2013    Past Surgical History  Procedure Laterality Date  . Vascular surgery    . Colonoscopy w/ biopsies  09/15/2010    Colonoscopy completed by Verdie Shire, M.D. Tubular adenoma of the ascending colon and descending colon reported up to 0.4 cm in diameter. No atypia.  Marland Kitchen Upper gi endoscopy  09/15/2010      Completed by Verdie Shire, M.D. for nausea. Normal exam reported.  . Breast surgery Right 02-21-2013    right mastectomy    There were no vitals filed for this visit.  Visit Diagnosis:  Myalgia and myositis  Abnormality of gait  Decreased mobility      Subjective Assessment - 02/18/15 1410    Subjective Pt reported feeling rectal pain after sitting 3 hrs since the last session. Pt states when she does have rectal pain, it feels like  hemorrhoids and depends on types of chair and duration of sitting. Pt states her mm are sore with the exercises. Pt states she gets headaches in the morning upon waking and notices there is associated rectal spasms. Pt reported her back is "about the same" when she gets up to do something, working in the yard, [place].Pt noticed that for thepast two weeks, she had urinary leakage upon waking before making it to the bathroom.      Pertinent History Hx of Breast Cancer (chemo, radiation 2015), Hx of anal fissure 2015 that naturally healed, hemorrhoids,  Hx of 3 vaginal deliveries with epsiotomies (8#, 9#, 10#).  Hx of fall on tailbone after stool collapsed 1996 . Pt reports she spends alot of time in her recliner, reclined back with feet propped up watching TV.    Limitations Sitting  sitting for long period causes her headaches    How long can you sit comfortably? 3 hr   Patient Stated Goals feeling better, sitting without ice pack, get back normal (active visiting friends and grandchildren via 3hr road trips)    Multiple Pain Sites Yes   Pain Score 8   Pain Location Back                      Pelvic Floor Special Questions - 02/19/15  0853    Pelvic Floor Internal Exam pt verbally consented without contraindication   Exam Type Rectal   Palpation Noted minor mm tensions posterior puborectalis R and anterior puborectal medial   significantly decreased tenderness throughout Tx           [facility] Adult PT Treatment/Exercise - 02/19/15 0851    Posture/Postural Control   Posture Comments proper sitting posture w/o cuing   Self-Care   Self-Care Other Self-Care Comments   Other Self-Care Comments  wean off off her ice packs    Exercises   Other Exercises  figure 4 stretch seated, neck ROM ext/flex, rotation   Manual Therapy   Internal Pelvic Floor anterior puborectalis, L posterior puborectalis    Other Manual Therapy light manual on occiput and sacrum                  PT Education - 02/19/15 0900    Education provided Yes   Education Details HEP   Person(s) Educated Patient   Methods Explanation;Demonstration;Tactile cues;Verbal cues;Handout   Comprehension Verbalized understanding;Returned demonstration             PT Long Term Goals - 02/18/15 1414    PT LONG TERM GOAL #1   Title Pt will report being able to sit for 30 min without an ice pack in order to visit friends.    Time 12   Period Weeks   Status Achieved   PT LONG TERM GOAL #2   Title Pt will report being able to completely empty bowels every day for 1 week in order to return to ADLs.   Time 12   Period Weeks   Status Achieved   PT LONG TERM GOAL #3   Title Pt will report  decreased back pain w/ sidebend L in order to pick up groceries.    Time 12   Period Weeks   Status On-going   PT LONG TERM GOAL #4   Title Pt will decrease her score on NIH-CPSI from 97% to < 50% in order to demo improved QOL.    Time 12   Period Weeks   Status On-going   PT LONG TERM GOAL #5   Title Pt will decrease her score on CRADI-8 from 47%  to < 30% in order to demo improved function.   Time 12   Period Weeks   Status On-going   Additional Long Term Goals   Additional Long Term Goals Yes   PT LONG TERM GOAL #6   Title Pt will report leaving her "hiney bag" at home every day for 1 week.      Time 12   Period Weeks   Status On-going   PT LONG TERM GOAL #7   Title Pt will report decreased headaches associated w/ rectal spasms in the morning upon waking by 50% in order to improve QOL.     Time 12   Period Weeks   Status New               Plan - 02/19/15 0901    Clinical Impression Statement Pt continues to show significantly decreased tenderness and tensions in posterior pelvic floor mm today. Light manual Tx at occiput/ sacrum was also applied after intrarectal manual Tx in order to address her headache compliant that occur in the morning with rectal spasms. Pt continues to show  compliance and was encouraged to decrease dependence on the use of ice packs (habit > painrelief) because she has been able to  tolerate sitting for longer periods now.  Pt continues to benefit from skilled PT to continue to progress towards her goals and to address her back pain and urinary incontinence.       Pt will benefit from skilled therapeutic intervention in order to improve on the following deficits Abnormal gait;Decreased activity tolerance;Decreased balance;Decreased mobility;Decreased strength;Postural dysfunction;Improper body mechanics;Hypomobility;Pain;Hypermobility;Increased muscle spasms;Obesity;Difficulty walking;Decreased range of motion;Decreased coordination;Decreased endurance;Decreased safety awareness;Increased fascial restricitons;Decreased scar mobility   Rehab Potential Good   Clinical Impairments Affecting Rehab Potential co-morbidities, sedentary lifestyle   PT Frequency 1x / week   PT Duration 12 weeks   PT Treatment/Interventions ADLs/Self Care Home Management;Aquatic Therapy;Functional mobility training;Stair training;Gait training;Biofeedback;Traction;Moist Heat;Therapeutic activities;Therapeutic exercise;Balance training;Neuromuscular re-education;Patient/family education;Passive range of motion;Scar mobilization;Manual techniques;Dry needling;Energy conservation;Taping;Cryotherapy;Electrical Stimulation;Other (comment)  pain science education   PT Next Visit Plan adress back pain and urinary incontinence    Consulted and Agree with Plan of Care Patient        Problem List Patient Active Problem List   Diagnosis Date Noted  . Acquired absence of breast 03/04/2013  . Congestive heart failure 03/04/2013  . Malignant neoplasm of lower-inner quadrant of female breast 03/04/2013  . Breast cancer of lower-inner quadrant of right female breast 01/28/2013    Jerl Mina  ,PT, DPT, E-RYT   02/19/2015, 9:07 AM  [place] [address] [place], [place], [address] Phone: [phone]   Fax:  [phone]

## 2015-02-23 ENCOUNTER — Encounter: Payer: No Typology Code available for payment source | Admitting: Physical Therapy

## 2015-02-23 ENCOUNTER — Other Ambulatory Visit: Payer: Self-pay | Admitting: *Deleted

## 2015-02-23 DIAGNOSIS — C50311 Malignant neoplasm of lower-inner quadrant of right female breast: Secondary | ICD-10-CM

## 2015-02-24 ENCOUNTER — Inpatient Hospital Stay (HOSPITAL_BASED_OUTPATIENT_CLINIC_OR_DEPARTMENT_OTHER): Payer: Medicare Other | Admitting: Oncology

## 2015-02-24 ENCOUNTER — Inpatient Hospital Stay: Payer: Medicare Other

## 2015-02-24 ENCOUNTER — Inpatient Hospital Stay: Payer: Medicare Other | Attending: Oncology

## 2015-02-24 VITALS — BP 122/85 | HR 91 | Temp 98.1°F | Resp 18 | Wt 201.7 lb

## 2015-02-24 DIAGNOSIS — Z79811 Long term (current) use of aromatase inhibitors: Secondary | ICD-10-CM

## 2015-02-24 DIAGNOSIS — Z170 Estrogen receptor positive status [ER+]: Secondary | ICD-10-CM | POA: Insufficient documentation

## 2015-02-24 DIAGNOSIS — R5381 Other malaise: Secondary | ICD-10-CM | POA: Insufficient documentation

## 2015-02-24 DIAGNOSIS — R05 Cough: Secondary | ICD-10-CM | POA: Insufficient documentation

## 2015-02-24 DIAGNOSIS — R531 Weakness: Secondary | ICD-10-CM

## 2015-02-24 DIAGNOSIS — R6883 Chills (without fever): Secondary | ICD-10-CM

## 2015-02-24 DIAGNOSIS — R110 Nausea: Secondary | ICD-10-CM | POA: Diagnosis not present

## 2015-02-24 DIAGNOSIS — C50311 Malignant neoplasm of lower-inner quadrant of right female breast: Secondary | ICD-10-CM

## 2015-02-24 DIAGNOSIS — R630 Anorexia: Secondary | ICD-10-CM | POA: Insufficient documentation

## 2015-02-24 DIAGNOSIS — Z79899 Other long term (current) drug therapy: Secondary | ICD-10-CM | POA: Diagnosis not present

## 2015-02-24 DIAGNOSIS — K589 Irritable bowel syndrome without diarrhea: Secondary | ICD-10-CM | POA: Diagnosis not present

## 2015-02-24 DIAGNOSIS — R102 Pelvic and perineal pain: Secondary | ICD-10-CM | POA: Insufficient documentation

## 2015-02-24 DIAGNOSIS — R5383 Other fatigue: Secondary | ICD-10-CM | POA: Diagnosis not present

## 2015-02-24 DIAGNOSIS — M810 Age-related osteoporosis without current pathological fracture: Secondary | ICD-10-CM | POA: Insufficient documentation

## 2015-02-24 DIAGNOSIS — I252 Old myocardial infarction: Secondary | ICD-10-CM | POA: Insufficient documentation

## 2015-02-24 DIAGNOSIS — R51 Headache: Secondary | ICD-10-CM

## 2015-02-24 DIAGNOSIS — F419 Anxiety disorder, unspecified: Secondary | ICD-10-CM | POA: Insufficient documentation

## 2015-02-24 DIAGNOSIS — E079 Disorder of thyroid, unspecified: Secondary | ICD-10-CM | POA: Insufficient documentation

## 2015-02-24 DIAGNOSIS — C50911 Malignant neoplasm of unspecified site of right female breast: Secondary | ICD-10-CM

## 2015-02-24 MED ORDER — SODIUM CHLORIDE 0.9 % IJ SOLN
10.0000 mL | INTRAMUSCULAR | Status: DC | PRN
  Administered 2015-02-24: 10 mL via INTRAVENOUS
  Filled 2015-02-24: qty 10

## 2015-02-24 MED ORDER — HEPARIN SOD (PORK) LOCK FLUSH 100 UNIT/ML IV SOLN
500.0000 [IU] | Freq: Once | INTRAVENOUS | Status: AC
  Administered 2015-02-24: 500 [IU] via INTRAVENOUS

## 2015-02-24 MED ORDER — HEPARIN SOD (PORK) LOCK FLUSH 100 UNIT/ML IV SOLN
INTRAVENOUS | Status: AC
  Filled 2015-02-24: qty 5

## 2015-02-24 NOTE — Progress Notes (Signed)
Abigail Ortega  [phone] [phone]  ID: Durwin Reges OB: 23-Jan-1951  MR#: [id number]  KNL#:[id number]  Patient Care Team: Perrin Maltese, MD as PCP - General (Internal Medicine) Robert Bellow, MD (General Surgery) Sharene Butters, MD (General Surgery)  CHIEF COMPLAINT: Stage IIIa ER/PR positive, HER-2 overexpressing adenocarcinoma of the right breast status post mastectomy and axillary node dissection.   No chief complaint on file.   INTERVAL HISTORY: Patient returns to clinic today for routine 3 month evaluation. She is tolerating letrozole well without significant side effects. She has multiple medical complaints today. She continued to have an occasional productive cough. She also continues to have pelvic pain. She has no neurologic complaints. She denies any recent fevers. She has a poor appetite, but denies weight loss. She has no chest pain or shortness of breath. She denies any nausea vomiting, constipation, or diarrhea. She has no urinary complaints.  Patient offers no further specific complaints today.  REVIEW OF SYSTEMS:   Review of Systems  Constitutional: Positive for chills and malaise/fatigue.  Respiratory: Positive for cough and sputum production.   Cardiovascular: Negative.   Gastrointestinal: Negative.   Genitourinary: Negative.   Neurological: Positive for weakness and headaches.  Psychiatric/Behavioral: The patient is nervous/anxious.     As per HPI. Otherwise, a complete review of systems is negatve.  PAST MEDICAL HISTORY: Past Medical History  Diagnosis Date  . CHF (congestive heart failure) 2013  . IBS (irritable bowel syndrome)   . Thyroid disease   . Hemorrhoids   . Clotting disorder     blood clots  . Heart attack 2012    coronary stent x2 completed by Dr. Clayborn Bigness.  . Malignant neoplasm of lower-inner quadrant of female breast January 20, 2013.    invasive mammary cancer, minimal 0.85 cm.  Histologic  grade 1.  . Heart attack Apr 29, 2013    PAST SURGICAL HISTORY: Past Surgical History  Procedure Laterality Date  . Vascular surgery    . Colonoscopy w/ biopsies  09/15/2010    Colonoscopy completed by Verdie Shire, M.D. Tubular adenoma of the ascending colon and descending colon reported up to 0.4 cm in diameter. No atypia.  Marland Kitchen Upper gi endoscopy  09/15/2010      Completed by Verdie Shire, M.D. for nausea. Normal exam reported.  . Breast surgery Right 02-21-2013    right mastectomy    FAMILY HISTORY Family History  Problem Relation Age of Onset  . Cancer Mother 70    breast  . Cancer Father     colon       ADVANCED DIRECTIVES:    HEALTH MAINTENANCE: Social History  Substance Use Topics  . Smoking status: Former Smoker -- 20 years  . Smokeless tobacco: Never Used  . Alcohol Use: No     Colonoscopy:  PAP:  Bone density:  Lipid panel:  No Known Allergies  Current Outpatient Prescriptions  Medication Sig Dispense Refill  . ALPRAZolam (XANAX) 0.25 MG tablet     . aspirin EC 81 MG tablet Take by mouth.    Marland Kitchen atorvastatin (LIPITOR) 40 MG tablet Take 1 tablet by mouth daily.    Marland Kitchen azithromycin (ZITHROMAX Z-PAK) 250 MG tablet 5 day Z-Pack; Follow package directions. (Patient not taking: Reported on 01/26/2015) 6 each 0  . Cholecalciferol (VITAMIN D-1000 MAX ST) 1000 UNITS tablet Take by mouth.    . clopidogrel (PLAVIX) 75 MG tablet Take by mouth.    . cyclobenzaprine (FLEXERIL) 10 MG  tablet     . furosemide (LASIX) 20 MG tablet Take 1 tablet by mouth daily.    Marland Kitchen gabapentin (NEURONTIN) 300 MG capsule     . HYDROcodone-acetaminophen (NORCO/VICODIN) 5-325 MG per tablet Take 1 tablet by mouth every 6 (six) hours as needed for pain.    . hydrocortisone (ANUSOL-HC) 25 MG suppository Place 1 suppository (25 mg total) rectally 2 (two) times daily as needed for hemorrhoids or itching. (Patient not taking: Reported on 01/26/2015) 24 suppository 3  . ibuprofen (ADVIL,MOTRIN) 600 MG tablet Take  600 mg by mouth every 6 (six) hours as needed.    Marland Kitchen letrozole (FEMARA) 2.5 MG tablet     . levothyroxine (SYNTHROID, LEVOTHROID) 112 MCG tablet Take by mouth.    . loratadine (CLARITIN) 10 MG tablet Take 10 mg by mouth daily.    . pantoprazole (PROTONIX) 40 MG tablet Take 1 tablet by mouth daily.    Marland Kitchen PARoxetine (PAXIL) 40 MG tablet Take 40 mg by mouth daily.    . ramipril (ALTACE) 5 MG capsule Take 1 capsule by mouth daily.    . TOPROL XL 25 MG 24 hr tablet Take 1 tablet by mouth daily.    . traZODone (DESYREL) 50 MG tablet     . venlafaxine XR (EFFEXOR-XR) 150 MG 24 hr capsule      No current facility-administered medications for this visit.   Facility-Administered Medications Ordered in Other Visits  Medication Dose Route Frequency Provider Last Rate Last Dose  . sodium chloride 0.9 % injection 10 mL  10 mL Intravenous PRN Lloyd Huger, MD   10 mL at 02/24/15 9798    OBJECTIVE: There were no vitals filed for this visit.   There is no weight on file to calculate BMI.    ECOG FS:0 - Asymptomatic  General: Well-developed, well-nourished, no acute distress. Eyes: Pink conjunctiva, anicteric sclera. Breasts: Right chest wall without evidence of recurrence. Left breast and axilla without lumps or masses. Lungs: Clear to auscultation bilaterally. Heart: Regular rate and rhythm. No rubs, murmurs, or gallops. Abdomen: Soft, nontender, nondistended. No organomegaly noted, normoactive bowel sounds. Musculoskeletal: No edema, cyanosis, or clubbing. Neuro: Alert, answering all questions appropriately. Cranial nerves grossly intact. Skin: No rashes or petechiae noted. Psych: Normal affect.    LAB RESULTS:  Lab Results  Component Value Date   NA 135* 03/20/2014   K 3.4* 03/20/2014   CL 101 03/20/2014   CO2 28 03/20/2014   GLUCOSE 112* 03/20/2014   BUN 8 03/20/2014   CREATININE 1.30 03/20/2014   CALCIUM 9.6 03/20/2014   PROT 6.9 03/20/2014   ALBUMIN 3.4 03/20/2014   AST 17  03/20/2014   ALT 23 03/20/2014   ALKPHOS 110 03/20/2014   BILITOT 0.4 03/20/2014   GFRNONAA 43* 03/20/2014   GFRAA 50* 03/20/2014    Lab Results  Component Value Date   WBC 6.5 04/24/2014   NEUTROABS 4.9 04/24/2014   HGB 10.2* 04/24/2014   HCT 30.4* 04/24/2014   MCV 93 04/24/2014   PLT 284 04/24/2014     STUDIES: No results found.  ASSESSMENT: Stage IIIa triple positive breast cancer   PLAN:    1.  Breast cancer: Patient has now completed all of her treatments including XRT her. She only completed 14 of 18 infusions of Herceptin secondary to persistent nausea. Continue with letrzole daily for 5 years completing in October 2020. Mammogram on June 30, 2014 was reported as BI-RADS 1, repeat in one year. Return to clinic in  December 2016 for further evaluation. 2. Osteoporosis:  Bone mineral density on June 30, 2014 was reported as -2.6. Patient was instructed to continue calcium and vitamin D supplementation. Repeat bone mineral density in December 2016. 3.  Nausea: Likely secondary to anxiety. Full imaging and GI workup was unrevealing. Continue current prescriptions as prescribed. 4.  Poor appetite: Improved. Continue Megace as needed. 5.  Anxiety: Continue Xanax as needed. 6.  Pelvic pain: Continue current treatment as prescribed.  Patient expressed understanding and was in agreement with this plan. She also understands that She can call clinic at any time with any questions, concerns, or complaints.   Breast cancer of lower-inner quadrant of right female breast   Staging form: Breast, AJCC 7th Edition     Pathologic stage from 02/24/2015: Stage IIIA (T1c, N2b, cM0) - Signed by Lloyd Huger, MD on 02/24/2015   Lloyd Huger, MD   02/24/2015 10:29 AM

## 2015-02-25 ENCOUNTER — Ambulatory Visit: Payer: Medicare Other | Admitting: Physical Therapy

## 2015-02-25 DIAGNOSIS — R269 Unspecified abnormalities of gait and mobility: Secondary | ICD-10-CM

## 2015-02-25 DIAGNOSIS — R2689 Other abnormalities of gait and mobility: Secondary | ICD-10-CM

## 2015-02-25 DIAGNOSIS — M791 Myalgia: Secondary | ICD-10-CM | POA: Diagnosis not present

## 2015-02-25 DIAGNOSIS — IMO0001 Reserved for inherently not codable concepts without codable children: Secondary | ICD-10-CM

## 2015-02-25 LAB — CANCER ANTIGEN 27.29: CA 27.29: 31.4 U/mL (ref 0.0–38.6)

## 2015-02-25 NOTE — Patient Instructions (Addendum)
Massage L scar gently 1x/day olive oil 2-3 min.     Chin-tucks 5 sec hold  10x/ 3x day  Self-distraction (pulling occiput up)  10x/  When sitting on hard chairs or when you feel headache comes on.   Elbow head press into wall with shoulders down. Count aloud to 5.  X 10 / 3 x day      Read "Why do I hurt?"  Continue to leave home for 2 hr without ice pack

## 2015-02-26 NOTE — Therapy (Signed)
[place] MAIN [facility] SERVICES [address] [place], [place], [address] Phone: [phone]   Fax:  [phone]  Physical Therapy Treatment  Patient Details  Name: Abigail Ortega MRN: [id number] Date of Birth: 15-Sep-1950 Referring Provider:  Windell Hummingbird, MD  Encounter Date: 02/25/2015      PT End of Session - 02/26/15 1822    Visit Number 4   Number of Visits 12   Date for PT Re-Evaluation 04/21/15   PT Start Time 1400   PT Stop Time 1515   PT Time Calculation (min) 75 min   Activity Tolerance Patient tolerated treatment well;No increased pain  Reported decreased pain/tenderness upon palpation in rectal mm    Behavior During Therapy [facility] for tasks assessed/performed      Past Medical History  Diagnosis Date  . CHF (congestive heart failure) 2013  . IBS (irritable bowel syndrome)   . Thyroid disease   . Hemorrhoids   . Clotting disorder     blood clots  . Heart attack 2012    coronary stent x2 completed by Dr. Clayborn Bigness.  . Malignant neoplasm of lower-inner quadrant of female breast January 20, 2013.    invasive mammary cancer, minimal 0.85 cm.  Histologic grade 1.  . Heart attack Apr 29, 2013    Past Surgical History  Procedure Laterality Date  . Vascular surgery    . Colonoscopy w/ biopsies  09/15/2010    Colonoscopy completed by Verdie Shire, M.D. Tubular adenoma of the ascending colon and descending colon reported up to 0.4 cm in diameter. No atypia.  Marland Kitchen Upper gi endoscopy  09/15/2010      Completed by Verdie Shire, M.D. for nausea. Normal exam reported.  . Breast surgery Right 02-21-2013    right mastectomy    There were no vitals filed for this visit.  Visit Diagnosis:  Myalgia and myositis  Abnormality of gait  Decreased mobility      Subjective Assessment - 02/25/15 1413    Subjective Pt reported she felt good  after leaving last session and woke the next morning " feeling not as bad" . She continued to feel fine  without headaches and improved rectal pain  for 3 days.  Pt was able to sit for 2hr before rectal pain started (includes driving to MD office, and sitting on hard surfaces) for 2 days. Pt was compliant in trying to leave her house without ice packs. Pt was able to  sit at church/MD appts without using ice packs for 2hrs. Pt has been seen by her MD re: chillls/ HA but had no significant findings. Headaches are triggered when rectal spasms occur.  Pt finds that her rectal pain eases off more quickly but the headaches ease off when pt applies ice pack under buttock. Headache today 5/10 w/ med, rectal pain with burning 6/10 (tolerable).      Pertinent History Hx of Breast Cancer (chemo, radiation 2015), Hx of anal fissure 2015 that naturally healed, hemorrhoids,  Hx of 3 vaginal deliveries with epsiotomies (8#, 9#, 10#).  Hx of fall on tailbone after stool collapsed 1996 . Pt reports she spends alot of time in her recliner, reclined back with feet propped up watching TV.    Patient Stated Goals feeling better, sitting without ice pack, get back normal (active visiting friends and grandchildren via 3hr road trips)  [facility] Adult PT Treatment/Exercise - 02/26/15 1857    Self-Care   Other Self-Care Comments  neuroscience education   Neuro Re-ed    Neuro Re-ed Details  cervical retraction in supine, by wall /self-applied distraction,    20 reps x 2 sets   Manual Therapy   Other Manual Therapy light manual on occiput and sacrum   light fascial scar massage along anterior neck                PT Education - 02/26/15 1821    Education provided Yes   Education Details HEP and neuroscience of pain   Person(s) Educated Patient   Methods Explanation;Tactile cues;Verbal cues;Handout;Demonstration   Comprehension Verbalized understanding;Returned demonstration             PT Long Term Goals - 02/25/15 1424    PT LONG TERM GOAL #1   Title Pt will report  being able to sit for 30 min without an ice pack in order to visit friends.    Time 12   Period Weeks   Status Achieved   PT LONG TERM GOAL #2   Title Pt will report being able to completely empty bowels every day for 1 week in order to return to ADLs.   Time 12   Period Weeks   Status Achieved   PT LONG TERM GOAL #3   Title Pt will report  decreased back pain w/ sidebend L in order to pick up groceries.    Time 12   Period Weeks   Status Achieved   PT LONG TERM GOAL #4   Title Pt will decrease her score on NIH-CPSI from 97% to < 50% in order to demo improved QOL.    Time 12   Period Weeks   Status On-going   PT LONG TERM GOAL #5   Title Pt will decrease her score on CRADI-8 from 47%  to < 30% in order to demo improved function.   Time 12   Period Weeks   Status On-going   Additional Long Term Goals   Additional Long Term Goals Yes   PT LONG TERM GOAL #6   Title Pt will report leaving her "hiney bag" at home every day for 1 week.      Time 12   Period Weeks   Status On-going   PT LONG TERM GOAL #7   Title Pt will report decreased headaches associated w/ rectal spasms in the morning upon waking by 50% in order to improve QOL.     Time 12   Period Weeks   Status Achieved               Plan - 02/25/15 1511    Clinical Impression Statement Pt achieved a goal related to being able to tolerate sitting without ice pack in community environments with up to 2 hrs. Pt demo'd good carry over with posture showing decreased trunk to hip flexion compared SOC but still displayed forward head.  Pt required more neuro-reedu with cervical retraction and benefited from manual Tx which included cervical distraction/ light manual occiput/ sacrum, scar massage along anterior L neck.  Pt reported her headache decreased from 5/10 to 1-2/10 and rectal pain decreased from  6/10 (burning) to  0/10. Initiated pain science education to continue encouraging pt with her functional progress despite  episodes of pain.    Pt will benefit from skilled therapeutic intervention in order to improve on the following deficits Abnormal gait;Decreased activity tolerance;Decreased balance;Decreased mobility;Decreased  strength;Postural dysfunction;Improper body mechanics;Hypomobility;Pain;Hypermobility;Increased muscle spasms;Obesity;Difficulty walking;Decreased range of motion;Decreased coordination;Decreased endurance;Decreased safety awareness;Increased fascial restricitons;Decreased scar mobility   Rehab Potential Good   Clinical Impairments Affecting Rehab Potential co-morbidities, sedentary lifestyle   PT Frequency 1x / week   PT Duration 12 weeks   PT Treatment/Interventions ADLs/Self Care Home Management;Aquatic Therapy;Functional mobility training;Stair training;Gait training;Biofeedback;Traction;Moist Heat;Therapeutic activities;Therapeutic exercise;Balance training;Neuromuscular re-education;Patient/family education;Passive range of motion;Scar mobilization;Manual techniques;Dry needling;Energy conservation;Taping;Cryotherapy;Electrical Stimulation;Other (comment)  pain science education   PT Next Visit Plan adress back pain and urinary incontinence    Consulted and Agree with Plan of Care Patient        Problem List Patient Active Problem List   Diagnosis Date Noted  . Acquired absence of breast 03/04/2013  . Congestive heart failure 03/04/2013  . Malignant neoplasm of lower-inner quadrant of female breast 03/04/2013  . Breast cancer of lower-inner quadrant of right female breast 01/28/2013    Jerl Mina ,PT, DPT, E-RYT  02/26/2015, 7:02 PM  [place] MAIN [facility] SERVICES [address] [place], [place], [address] Phone: [phone]   Fax:  [phone]

## 2015-03-02 ENCOUNTER — Encounter: Payer: No Typology Code available for payment source | Admitting: Physical Therapy

## 2015-03-04 ENCOUNTER — Encounter: Payer: No Typology Code available for payment source | Admitting: Physical Therapy

## 2015-03-11 ENCOUNTER — Encounter: Payer: No Typology Code available for payment source | Admitting: Physical Therapy

## 2015-03-12 ENCOUNTER — Encounter: Payer: No Typology Code available for payment source | Admitting: Physical Therapy

## 2015-04-12 ENCOUNTER — Telehealth: Payer: Self-pay | Admitting: *Deleted

## 2015-04-12 MED ORDER — LETROZOLE 2.5 MG PO TABS: 2.5000 mg | ORAL_TABLET | Freq: Every day | ORAL | Status: DC

## 2015-04-12 NOTE — Telephone Encounter (Signed)
Escribed

## 2015-04-16 ENCOUNTER — Telehealth: Payer: Self-pay | Admitting: *Deleted

## 2015-04-16 MED ORDER — PANTOPRAZOLE SODIUM 40 MG PO TBEC: 40.0000 mg | DELAYED_RELEASE_TABLET | Freq: Every day | ORAL | Status: DC

## 2015-04-16 NOTE — Telephone Encounter (Signed)
Escribed

## 2015-07-05 ENCOUNTER — Ambulatory Visit
Admission: RE | Admit: 2015-07-05 | Discharge: 2015-07-05 | Disposition: A | Discharge status: Home / Self Care | Payer: Medicare Other | Source: Ambulatory Visit | Attending: Oncology | Admitting: Oncology

## 2015-07-05 ENCOUNTER — Other Ambulatory Visit: Payer: Self-pay | Admitting: Oncology

## 2015-07-05 DIAGNOSIS — Z13820 Encounter for screening for osteoporosis: Secondary | ICD-10-CM | POA: Diagnosis not present

## 2015-07-05 DIAGNOSIS — Z853 Personal history of malignant neoplasm of breast: Secondary | ICD-10-CM | POA: Diagnosis present

## 2015-07-05 DIAGNOSIS — M8580 Other specified disorders of bone density and structure, unspecified site: Secondary | ICD-10-CM | POA: Diagnosis not present

## 2015-07-05 DIAGNOSIS — C50911 Malignant neoplasm of unspecified site of right female breast: Secondary | ICD-10-CM

## 2015-07-05 HISTORY — DX: Malignant neoplasm of unspecified site of unspecified female breast: C50.919

## 2015-07-15 ENCOUNTER — Inpatient Hospital Stay: Payer: Medicare Other | Attending: Oncology

## 2015-07-15 ENCOUNTER — Inpatient Hospital Stay: Payer: Medicare Other | Admitting: Oncology

## 2015-07-15 ENCOUNTER — Other Ambulatory Visit: Payer: Medicare Other

## 2015-07-15 ENCOUNTER — Inpatient Hospital Stay (HOSPITAL_BASED_OUTPATIENT_CLINIC_OR_DEPARTMENT_OTHER): Payer: Medicare Other | Admitting: Oncology

## 2015-07-15 ENCOUNTER — Inpatient Hospital Stay: Payer: Medicare Other

## 2015-07-15 VITALS — BP 134/81 | HR 102 | Temp 99.0°F | Resp 20 | Wt 213.4 lb

## 2015-07-15 DIAGNOSIS — R630 Anorexia: Secondary | ICD-10-CM | POA: Diagnosis not present

## 2015-07-15 DIAGNOSIS — Z79899 Other long term (current) drug therapy: Secondary | ICD-10-CM | POA: Insufficient documentation

## 2015-07-15 DIAGNOSIS — R51 Headache: Secondary | ICD-10-CM | POA: Diagnosis not present

## 2015-07-15 DIAGNOSIS — R110 Nausea: Secondary | ICD-10-CM

## 2015-07-15 DIAGNOSIS — Z955 Presence of coronary angioplasty implant and graft: Secondary | ICD-10-CM | POA: Diagnosis not present

## 2015-07-15 DIAGNOSIS — M810 Age-related osteoporosis without current pathological fracture: Secondary | ICD-10-CM | POA: Insufficient documentation

## 2015-07-15 DIAGNOSIS — Z9011 Acquired absence of right breast and nipple: Secondary | ICD-10-CM

## 2015-07-15 DIAGNOSIS — L299 Pruritus, unspecified: Secondary | ICD-10-CM

## 2015-07-15 DIAGNOSIS — C50311 Malignant neoplasm of lower-inner quadrant of right female breast: Secondary | ICD-10-CM | POA: Insufficient documentation

## 2015-07-15 DIAGNOSIS — Z79811 Long term (current) use of aromatase inhibitors: Secondary | ICD-10-CM | POA: Diagnosis not present

## 2015-07-15 DIAGNOSIS — Z9221 Personal history of antineoplastic chemotherapy: Secondary | ICD-10-CM

## 2015-07-15 DIAGNOSIS — R102 Pelvic and perineal pain: Secondary | ICD-10-CM

## 2015-07-15 DIAGNOSIS — F419 Anxiety disorder, unspecified: Secondary | ICD-10-CM

## 2015-07-15 DIAGNOSIS — Z7982 Long term (current) use of aspirin: Secondary | ICD-10-CM | POA: Insufficient documentation

## 2015-07-15 DIAGNOSIS — R5383 Other fatigue: Secondary | ICD-10-CM | POA: Insufficient documentation

## 2015-07-15 DIAGNOSIS — Z170 Estrogen receptor positive status [ER+]: Secondary | ICD-10-CM | POA: Diagnosis not present

## 2015-07-15 DIAGNOSIS — Z87891 Personal history of nicotine dependence: Secondary | ICD-10-CM | POA: Insufficient documentation

## 2015-07-15 DIAGNOSIS — R531 Weakness: Secondary | ICD-10-CM | POA: Diagnosis not present

## 2015-07-15 DIAGNOSIS — R05 Cough: Secondary | ICD-10-CM | POA: Diagnosis not present

## 2015-07-15 DIAGNOSIS — R6883 Chills (without fever): Secondary | ICD-10-CM | POA: Insufficient documentation

## 2015-07-15 DIAGNOSIS — R5381 Other malaise: Secondary | ICD-10-CM | POA: Diagnosis not present

## 2015-07-15 DIAGNOSIS — I252 Old myocardial infarction: Secondary | ICD-10-CM | POA: Diagnosis not present

## 2015-07-15 DIAGNOSIS — Z923 Personal history of irradiation: Secondary | ICD-10-CM

## 2015-07-15 DIAGNOSIS — E079 Disorder of thyroid, unspecified: Secondary | ICD-10-CM | POA: Diagnosis not present

## 2015-07-15 DIAGNOSIS — C50911 Malignant neoplasm of unspecified site of right female breast: Secondary | ICD-10-CM

## 2015-07-15 MED ORDER — HEPARIN SOD (PORK) LOCK FLUSH 100 UNIT/ML IV SOLN
500.0000 [IU] | Freq: Once | INTRAVENOUS | Status: AC
  Administered 2015-07-15: 500 [IU] via INTRAVENOUS
  Filled 2015-07-15: qty 5

## 2015-07-15 MED ORDER — SODIUM CHLORIDE 0.9 % IJ SOLN
10.0000 mL | Freq: Once | INTRAMUSCULAR | Status: AC
  Administered 2015-07-15: 10 mL via INTRAVENOUS
  Filled 2015-07-15: qty 10

## 2015-07-15 NOTE — Progress Notes (Signed)
Patient has episodes og chills, tingling sensation in mouth, and headaches that has been evaluated by PCP with no abnormalities.  Her PCP has referred her to neurology in 07/2015 for headaches.

## 2015-07-16 ENCOUNTER — Other Ambulatory Visit: Payer: Self-pay | Admitting: Oncology

## 2015-07-16 LAB — CANCER ANTIGEN 27.29: CA 27.29: 37.1 U/mL (ref 0.0–38.6)

## 2015-07-16 NOTE — Progress Notes (Signed)
[place]  [phone] Fax:(336) [phone]  ID: Abigail Ortega OB: 18-Feb-1951  MR#: [id number]  KGS#:[id number]  Patient Care Team: Perrin Maltese, MD as PCP - General (Internal Medicine) Robert Bellow, MD (General Surgery) Sharene Butters, MD (General Surgery)  CHIEF COMPLAINT: Stage IIIa ER/PR positive, HER-2 overexpressing adenocarcinoma of the right breast status post mastectomy and axillary node dissection.    Chief Complaint  Patient presents with  . Breast Cancer    INTERVAL HISTORY: Patient returns to clinic today for routine 3 month evaluation. She has periodic episodes of chills and a "tingling" sensation in her mouth.  She is having occasional headaches.  She also feels "itchy" all over, but does not have a rash. She has no other neurologic complaints. She denies any recent fevers. She has a poor appetite, but denies weight loss. She has no chest pain or shortness of breath. She denies any nausea vomiting, constipation, or diarrhea. She has no urinary complaints.  Patient offers no further specific complaints today.  REVIEW OF SYSTEMS:   Review of Systems  Constitutional: Positive for chills and malaise/fatigue.  Eyes: Negative.   Respiratory: Positive for cough. Negative for sputum production.   Cardiovascular: Negative.   Gastrointestinal: Negative.   Genitourinary: Negative.   Skin: Positive for itching.  Neurological: Positive for sensory change, weakness and headaches.  Psychiatric/Behavioral: The patient is nervous/anxious.     As per HPI. Otherwise, a complete review of systems is negatve.  PAST MEDICAL HISTORY: Past Medical History  Diagnosis Date  . CHF (congestive heart failure) ([place]) 2013  . IBS (irritable bowel syndrome)   . Thyroid disease   . Hemorrhoids   . Clotting disorder ([place])     blood clots  . Heart attack [facility]) 2012    coronary stent x2 completed by Dr. Clayborn Bigness.  . Malignant neoplasm of lower-inner  quadrant of female breast [facility]) January 20, 2013.    invasive mammary cancer, minimal 0.85 cm.  Histologic grade 1.  . Heart attack ([place]) Apr 29, 2013  . Breast cancer [facility]) 2014    Right breast cancer - chemo, radiation and Mastectomy    PAST SURGICAL HISTORY: Past Surgical History  Procedure Laterality Date  . Vascular surgery    . Colonoscopy w/ biopsies  09/15/2010    Colonoscopy completed by Verdie Shire, M.D. Tubular adenoma of the ascending colon and descending colon reported up to 0.4 cm in diameter. No atypia.  Marland Kitchen Upper gi endoscopy  09/15/2010      Completed by Verdie Shire, M.D. for nausea. Normal exam reported.  . Breast surgery Right 02-21-2013    right mastectomy  . Mastectomy Right 2014    FAMILY HISTORY Family History  Problem Relation Age of Onset  . Cancer Mother 76    breast  . Breast cancer Mother   . Cancer Father     colon       ADVANCED DIRECTIVES:    HEALTH MAINTENANCE: Social History  Substance Use Topics  . Smoking status: Former Smoker -- 20 years  . Smokeless tobacco: Never Used  . Alcohol Use: No     Colonoscopy:  PAP:  Bone density:  Lipid panel:  No Known Allergies  Current Outpatient Prescriptions  Medication Sig Dispense Refill  . ALPRAZolam (XANAX) 0.25 MG tablet Take 0.25 mg by mouth 2 (two) times daily.  2  . aspirin EC 81 MG tablet Take by mouth.    Marland Kitchen atorvastatin (LIPITOR) 40 MG tablet Take  1 tablet by mouth once daily    . Cholecalciferol (VITAMIN D-1000 MAX ST) 1000 UNITS tablet Take by mouth.    . clopidogrel (PLAVIX) 75 MG tablet Take by mouth.    . ferrous sulfate 325 (65 FE) MG tablet 2 (two) times daily.  6  . gabapentin (NEURONTIN) 300 MG capsule     . letrozole (FEMARA) 2.5 MG tablet Take 1 tablet (2.5 mg total) by mouth daily. 90 tablet 3  . levothyroxine (SYNTHROID, LEVOTHROID) 88 MCG tablet     . pantoprazole (PROTONIX) 40 MG tablet Take 1 tablet (40 mg total) by mouth daily. 90 tablet 1  . ramipril (ALTACE) 5 MG capsule  Take 1 capsule by mouth daily.    . traZODone (DESYREL) 50 MG tablet     . venlafaxine XR (EFFEXOR-XR) 150 MG 24 hr capsule     . cyclobenzaprine (FLEXERIL) 10 MG tablet Reported on 07/15/2015    . Fe Fum-FA-B Cmp-C-Zn-Mg-Mn-Cu (HEMOCYTE PLUS) 106-1 MG CAPS Take 1 capsule by mouth daily. Reported on 07/15/2015  6  . furosemide (LASIX) 20 MG tablet Take 1 tablet by mouth daily. Reported on 07/15/2015    . ibuprofen (ADVIL,MOTRIN) 600 MG tablet Take 600 mg by mouth every 6 (six) hours as needed. Reported on 07/15/2015    . loratadine (CLARITIN) 10 MG tablet Take 10 mg by mouth daily. Reported on 07/15/2015     No current facility-administered medications for this visit.   Facility-Administered Medications Ordered in Other Visits  Medication Dose Route Frequency Provider Last Rate Last Dose  . sodium chloride 0.9 % injection 10 mL  10 mL Intravenous PRN Lloyd Huger, MD   10 mL at 02/24/15 0951    OBJECTIVE: Filed Vitals:   07/15/15 1444  BP: 134/81  Pulse: 102  Temp: 99 F (37.2 C)  Resp: 20     Body mass index is 33.42 kg/(m^2).    ECOG FS:0 - Asymptomatic  General: Well-developed, well-nourished, no acute distress. Eyes: Pink conjunctiva, anicteric sclera. Breasts: Right chest wall without evidence of recurrence. Left breast and axilla without lumps or masses. Patient requested exam be deferred today.  Lungs: Clear to auscultation bilaterally. Heart: Regular rate and rhythm. No rubs, murmurs, or gallops. Abdomen: Soft, nontender, nondistended. No organomegaly noted, normoactive bowel sounds. Musculoskeletal: No edema, cyanosis, or clubbing. Neuro: Alert, answering all questions appropriately. Cranial nerves grossly intact. Skin: No rashes or petechiae noted. Psych: Normal affect.    LAB RESULTS:  Lab Results  Component Value Date   NA 135* 03/20/2014   K 3.4* 03/20/2014   CL 101 03/20/2014   CO2 28 03/20/2014   GLUCOSE 112* 03/20/2014   BUN 8 03/20/2014    CREATININE 1.30 03/20/2014   CALCIUM 9.6 03/20/2014   PROT 6.9 03/20/2014   ALBUMIN 3.4 03/20/2014   AST 17 03/20/2014   ALT 23 03/20/2014   ALKPHOS 110 03/20/2014   BILITOT 0.4 03/20/2014   GFRNONAA 43* 03/20/2014   GFRAA 50* 03/20/2014    Lab Results  Component Value Date   WBC 6.5 04/24/2014   NEUTROABS 4.9 04/24/2014   HGB 10.2* 04/24/2014   HCT 30.4* 04/24/2014   MCV 93 04/24/2014   PLT 284 04/24/2014     STUDIES: Dg Bone Density  07/05/2015  EXAM: DUAL X-RAY ABSORPTIOMETRY (DXA) FOR BONE MINERAL DENSITY IMPRESSION: Dear Dr. Delight Hoh, Your patient Cheyna Retana completed a BMD test on 07/05/2015 using the [place] (analysis version: 14.10) manufactured by [employer]. The  following summarizes the results of our evaluation. PATIENT BIOGRAPHICAL: Name: Thais, Silberstein Patient ID: [id number] Birth Date: 05-09-1951 Height: 65.5 in. Gender: Female Exam Date: 07/05/2015 Weight: 201.0 lbs. Indications: High Risk Meds, History of Breast Cancer, Hypothyroid, Postmenopausal Fractures: Treatments: ASPRIN 81 MG, femara, letrozole, LEVOTHYROXINE, Multi-Vitamin with calcium, Prilosec, Vitamin D ASSESSMENT: The BMD measured at Femur Total Left is 0.729 g/cm2 with a T-score of -2.2. This patient is considered osteopenic according to [place] [facility]) criteria. Site Region Measured Measured WHO Young Adult BMD Date       Age      Classification T-score AP Spine L1-L4 07/05/2015 65.3 Osteopenia -1.6 0.994 g/cm2 AP Spine L1-L4 06/30/2014 64.3 Osteopenia -1.1 1.052 g/cm2 DualFemur Total Left 07/05/2015 65.3 Osteopenia -2.2 0.729 g/cm2 DualFemur Total Left 06/30/2014 64.3 Osteoporosis -2.6 0.679 g/cm2 World Health Organization [facility]) criteria for post-menopausal, Caucasian Women: Normal:       T-score at or above -1 SD Osteopenia:   T-score between -1 and -2.5 SD Osteoporosis: T-score at or below -2.5 SD RECOMMENDATIONS: [place]  recommends that FDA-approved medical therapies be considered in postmenopausal women and men age 70 or older with a: 1. Hip or vertebral (clinical or morphometric) fracture. 2. T-score of < -2.5 at the spine or hip. 3. Ten-year fracture probability by FRAX of 3% or greater for hip fracture or 20% or greater for major osteoporotic fracture. All treatment decisions require clinical judgment and consideration of individual patient factors, including patient preferences, co-morbidities, previous drug use, risk factors not captured in the FRAX model (e.g. falls, vitamin D deficiency, increased bone turnover, interval significant decline in bone density) and possible under - or over-estimation of fracture risk by FRAX. All patients should ensure an adequate intake of dietary calcium (1200 mg/d) and vitamin D (800 IU daily) unless contraindicated. FOLLOW-UP: People with diagnosed cases of osteoporosis or at high risk for fracture should have regular bone mineral density tests. For patients eligible for Medicare, routine testing is allowed once every 2 years. The testing frequency can be increased to one year for patients who have rapidly progressing disease, those who are receiving or discontinuing medical therapy to restore bone mass, or have additional risk factors. I have reviewed this report, and agree with the above findings. [facility] Radiology Electronically Signed   By: Ilona Sorrel M.D.   On: 07/05/2015 13:28   Mm Digital Screening Unilat L  07/05/2015  CLINICAL DATA:  Screening. EXAM: DIGITAL SCREENING UNILATERAL LEFT MAMMOGRAM WITH CAD COMPARISON:  Previous exam(s). ACR Breast Density Category b: There are scattered areas of fibroglandular density. FINDINGS: There are no findings suspicious for malignancy. Images were processed with CAD. IMPRESSION: No mammographic evidence of malignancy. A result letter of this screening mammogram will be mailed directly to the patient. RECOMMENDATION: Screening mammogram  in one year. (Code:SM-B-01Y) BI-RADS CATEGORY  1: Negative. Electronically Signed   By: Lovey Newcomer M.D.   On: 07/05/2015 15:52    ASSESSMENT: Stage IIIa triple positive breast cancer   PLAN:    1.  Breast cancer: Patient has now completed all of her treatments including XRT her. She only completed 14 of 18 infusions of Herceptin secondary to persistent nausea.  Although her multiple complaints listed in the HPI are unlikely due to letrozole, will hold for 1 month to see if there is any resolution. Patient has been instructed to call to determine whether or not to restart. She will complete 5 years of letrozole in ~October 2020. Mammogram on July 05, 2015 was reported as BI-RADS 1, repeat in one year. Return to clinic in 3 months for further evaluation. 2. Osteoporosis:  Bone mineral density on July 05, 2015 was reported as -2.2, which is improved from 1 year prior when it was reported as -2.6. Patient was instructed to continue calcium and vitamin D supplementation. Repeat bone mineral density in December 2017. 3.  Nausea: Likely secondary to anxiety. Previously, full imaging and GI workup was unrevealing. Continue current prescriptions as prescribed. 4.  Poor appetite: Improved. Continue Megace as needed. 5.  Anxiety: Continue Xanax as needed. 6.  Pelvic pain: Continue current treatment as prescribed. 7.  Headaches, itching: evaluation and treatment per PCP.  Patient expressed understanding and was in agreement with this plan. She also understands that She can call clinic at any time with any questions, concerns, or complaints.   Breast cancer of lower-inner quadrant of right female breast   Staging form: Breast, AJCC 7th Edition     Pathologic stage from 02/24/2015: Stage IIIA (T1c, N2b, cM0) - Signed by Lloyd Huger, MD on 02/24/2015   Lloyd Huger, MD   07/16/2015 7:43 AM

## 2015-08-20 DIAGNOSIS — E669 Obesity, unspecified: Secondary | ICD-10-CM | POA: Insufficient documentation

## 2015-10-12 ENCOUNTER — Ambulatory Visit: Payer: Medicare Other | Admitting: Oncology

## 2015-10-12 ENCOUNTER — Other Ambulatory Visit: Payer: Medicare Other

## 2015-10-18 ENCOUNTER — Inpatient Hospital Stay: Payer: Medicare Other

## 2015-10-18 ENCOUNTER — Inpatient Hospital Stay: Payer: Medicare Other | Admitting: Oncology

## 2015-10-19 ENCOUNTER — Inpatient Hospital Stay: Payer: Medicare Other | Attending: Oncology

## 2015-10-19 ENCOUNTER — Inpatient Hospital Stay: Payer: Medicare Other

## 2015-10-19 ENCOUNTER — Inpatient Hospital Stay (HOSPITAL_BASED_OUTPATIENT_CLINIC_OR_DEPARTMENT_OTHER): Payer: Medicare Other | Admitting: Oncology

## 2015-10-19 VITALS — BP 115/83 | HR 99 | Temp 98.1°F | Resp 20 | Wt 206.8 lb

## 2015-10-19 DIAGNOSIS — K589 Irritable bowel syndrome without diarrhea: Secondary | ICD-10-CM | POA: Insufficient documentation

## 2015-10-19 DIAGNOSIS — E079 Disorder of thyroid, unspecified: Secondary | ICD-10-CM | POA: Insufficient documentation

## 2015-10-19 DIAGNOSIS — F419 Anxiety disorder, unspecified: Secondary | ICD-10-CM | POA: Insufficient documentation

## 2015-10-19 DIAGNOSIS — Z7982 Long term (current) use of aspirin: Secondary | ICD-10-CM | POA: Insufficient documentation

## 2015-10-19 DIAGNOSIS — C50311 Malignant neoplasm of lower-inner quadrant of right female breast: Secondary | ICD-10-CM

## 2015-10-19 DIAGNOSIS — Z170 Estrogen receptor positive status [ER+]: Secondary | ICD-10-CM

## 2015-10-19 DIAGNOSIS — R419 Unspecified symptoms and signs involving cognitive functions and awareness: Secondary | ICD-10-CM

## 2015-10-19 DIAGNOSIS — Z87891 Personal history of nicotine dependence: Secondary | ICD-10-CM | POA: Insufficient documentation

## 2015-10-19 DIAGNOSIS — Z95818 Presence of other cardiac implants and grafts: Secondary | ICD-10-CM | POA: Insufficient documentation

## 2015-10-19 DIAGNOSIS — Z79811 Long term (current) use of aromatase inhibitors: Secondary | ICD-10-CM | POA: Insufficient documentation

## 2015-10-19 DIAGNOSIS — R110 Nausea: Secondary | ICD-10-CM | POA: Diagnosis not present

## 2015-10-19 DIAGNOSIS — Z9011 Acquired absence of right breast and nipple: Secondary | ICD-10-CM

## 2015-10-19 DIAGNOSIS — L299 Pruritus, unspecified: Secondary | ICD-10-CM | POA: Insufficient documentation

## 2015-10-19 DIAGNOSIS — Z79899 Other long term (current) drug therapy: Secondary | ICD-10-CM | POA: Diagnosis not present

## 2015-10-19 DIAGNOSIS — R51 Headache: Secondary | ICD-10-CM | POA: Insufficient documentation

## 2015-10-19 DIAGNOSIS — H9319 Tinnitus, unspecified ear: Secondary | ICD-10-CM | POA: Insufficient documentation

## 2015-10-19 DIAGNOSIS — M810 Age-related osteoporosis without current pathological fracture: Secondary | ICD-10-CM | POA: Diagnosis not present

## 2015-10-19 DIAGNOSIS — R634 Abnormal weight loss: Secondary | ICD-10-CM | POA: Diagnosis not present

## 2015-10-19 DIAGNOSIS — Z923 Personal history of irradiation: Secondary | ICD-10-CM | POA: Insufficient documentation

## 2015-10-19 DIAGNOSIS — Z9221 Personal history of antineoplastic chemotherapy: Secondary | ICD-10-CM | POA: Insufficient documentation

## 2015-10-19 DIAGNOSIS — I252 Old myocardial infarction: Secondary | ICD-10-CM | POA: Diagnosis not present

## 2015-10-19 DIAGNOSIS — Z95828 Presence of other vascular implants and grafts: Secondary | ICD-10-CM

## 2015-10-19 MED ORDER — HEPARIN SOD (PORK) LOCK FLUSH 100 UNIT/ML IV SOLN
500.0000 [IU] | Freq: Once | INTRAVENOUS | Status: AC
  Administered 2015-10-19: 500 [IU] via INTRAVENOUS

## 2015-10-19 MED ORDER — SODIUM CHLORIDE 0.9% FLUSH
10.0000 mL | INTRAVENOUS | Status: DC | PRN
  Administered 2015-10-19: 10 mL via INTRAVENOUS
  Filled 2015-10-19: qty 10

## 2015-10-19 NOTE — Progress Notes (Signed)
Patient is having tinnitus and headaches that has been evaluated by neurology.

## 2015-10-20 LAB — CANCER ANTIGEN 27.29: CA 27.29: 40.1 U/mL — ABNORMAL HIGH (ref 0.0–38.6)

## 2015-10-24 NOTE — Progress Notes (Signed)
[place]  Telephone:(336) [phone] [phone]) [phone]  ID: Abigail Ortega OB: [birth year]  MR#: [id number]  YQI#:[id number]  Patient Care Team: Perrin Maltese, MD as PCP - General (Internal Medicine) Robert Bellow, MD (General Surgery) Sharene Butters, MD (General Surgery)  CHIEF COMPLAINT: Stage IIIa ER/PR positive, HER-2 overexpressing adenocarcinoma of the right breast status post mastectomy and axillary node dissection.    Chief Complaint  Patient presents with  . Breast Cancer    INTERVAL HISTORY: Patient returns to clinic today for routine 3 month evaluation. She has periodic episodes of tinnitus and headaches and is actively being evaluated by neurology. She otherwise feels well. She discontinued letrozole temporarily for "itchiness", but her symptoms did not change therefore she restarted.  She has no other neurologic complaints. She denies any recent fevers. She has intentional weight loss. She has no chest pain or shortness of breath. She denies any nausea vomiting, constipation, or diarrhea. She has no urinary complaints.  Patient offers no further specific complaints today.  REVIEW OF SYSTEMS:   Review of Systems  Constitutional: Positive for weight loss. Negative for fever, chills and malaise/fatigue.  HENT: Positive for tinnitus.   Eyes: Negative.   Respiratory: Negative.  Negative for cough and sputum production.   Cardiovascular: Negative.  Negative for chest pain.  Gastrointestinal: Negative.   Genitourinary: Negative.   Skin: Negative for itching.  Neurological: Positive for headaches. Negative for sensory change and weakness.  Psychiatric/Behavioral: The patient is nervous/anxious.     As per HPI. Otherwise, a complete review of systems is negatve.  PAST MEDICAL HISTORY: Past Medical History  Diagnosis Date  . CHF (congestive heart failure) ([place]) 2013  . IBS (irritable bowel syndrome)   . Thyroid disease   . Hemorrhoids   .  Clotting disorder ([place])     blood clots  . Heart attack [facility]) 2012    coronary stent x2 completed by Dr. Clayborn Bigness.  . Malignant neoplasm of lower-inner quadrant of female breast [facility]) January 20, 2013.    invasive mammary cancer, minimal 0.85 cm.  Histologic grade 1.  . Heart attack ([place]) Apr 29, 2013  . Breast cancer [facility]) 2014    Right breast cancer - chemo, radiation and Mastectomy    PAST SURGICAL HISTORY: Past Surgical History  Procedure Laterality Date  . Vascular surgery    . Colonoscopy w/ biopsies  09/15/2010    Colonoscopy completed by Verdie Shire, M.D. Tubular adenoma of the ascending colon and descending colon reported up to 0.4 cm in diameter. No atypia.  Marland Kitchen Upper gi endoscopy  09/15/2010      Completed by Verdie Shire, M.D. for nausea. Normal exam reported.  . Breast surgery Right 02-21-2013    right mastectomy  . Mastectomy Right 2014    FAMILY HISTORY Family History  Problem Relation Age of Onset  . Cancer Mother 59    breast  . Breast cancer Mother   . Cancer Father     colon       ADVANCED DIRECTIVES:    HEALTH MAINTENANCE: Social History  Substance Use Topics  . Smoking status: Former Smoker -- 20 years  . Smokeless tobacco: Never Used  . Alcohol Use: No     Colonoscopy:  PAP:  Bone density:  Lipid panel:  No Known Allergies  Current Outpatient Prescriptions  Medication Sig Dispense Refill  . atorvastatin (LIPITOR) 40 MG tablet Take 1 tablet by mouth once daily    . clopidogrel (PLAVIX) 75  MG tablet Take by mouth.    . cyclobenzaprine (FLEXERIL) 10 MG tablet Reported on 07/15/2015    . Fe Fum-FA-B Cmp-C-Zn-Mg-Mn-Cu (HEMOCYTE PLUS) 106-1 MG CAPS Take 1 capsule by mouth daily. Reported on 07/15/2015  6  . ferrous sulfate 325 (65 FE) MG tablet 2 (two) times daily.  6  . gabapentin (NEURONTIN) 300 MG capsule     . ibuprofen (ADVIL,MOTRIN) 600 MG tablet Take 600 mg by mouth every 6 (six) hours as needed. Reported on 07/15/2015    . letrozole (FEMARA) 2.5 MG  tablet Take 1 tablet (2.5 mg total) by mouth daily. 90 tablet 3  . levothyroxine (SYNTHROID, LEVOTHROID) 88 MCG tablet     . loratadine (CLARITIN) 10 MG tablet Take 10 mg by mouth daily. Reported on 07/15/2015    . pantoprazole (PROTONIX) 40 MG tablet Take 1 tablet by mouth  daily 90 tablet 0  . ramipril (ALTACE) 5 MG capsule Take 1 capsule by mouth daily.    . traZODone (DESYREL) 50 MG tablet     . venlafaxine XR (EFFEXOR-XR) 150 MG 24 hr capsule     . ALPRAZolam (XANAX) 0.25 MG tablet Take 0.25 mg by mouth 2 (two) times daily.  2  . aspirin EC 81 MG tablet Take by mouth.    . Cholecalciferol (VITAMIN D-1000 MAX ST) 1000 UNITS tablet Take by mouth.     No current facility-administered medications for this visit.   Facility-Administered Medications Ordered in Other Visits  Medication Dose Route Frequency Provider Last Rate Last Dose  . sodium chloride 0.9 % injection 10 mL  10 mL Intravenous PRN Lloyd Huger, MD   10 mL at 02/24/15 0951    OBJECTIVE: Filed Vitals:   10/19/15 1144  BP: 115/83  Pulse: 99  Temp: 98.1 F (36.7 C)  Resp: 20     Body mass index is 32.38 kg/(m^2).    ECOG FS:0 - Asymptomatic  General: Well-developed, well-nourished, no acute distress. Eyes: Pink conjunctiva, anicteric sclera. Breasts: Right chest wall without evidence of recurrence. Left breast and axilla without lumps or masses.   Lungs: Clear to auscultation bilaterally. Heart: Regular rate and rhythm. No rubs, murmurs, or gallops. Abdomen: Soft, nontender, nondistended. No organomegaly noted, normoactive bowel sounds. Musculoskeletal: No edema, cyanosis, or clubbing. Neuro: Alert, answering all questions appropriately. Cranial nerves grossly intact. Skin: No rashes or petechiae noted. Psych: Normal affect.    LAB RESULTS:  Lab Results  Component Value Date   NA 135* 03/20/2014   K 3.4* 03/20/2014   CL 101 03/20/2014   CO2 28 03/20/2014   GLUCOSE 112* 03/20/2014   BUN 8 03/20/2014     CREATININE 1.30 03/20/2014   CALCIUM 9.6 03/20/2014   PROT 6.9 03/20/2014   ALBUMIN 3.4 03/20/2014   AST 17 03/20/2014   ALT 23 03/20/2014   ALKPHOS 110 03/20/2014   BILITOT 0.4 03/20/2014   GFRNONAA 43* 03/20/2014   GFRAA 50* 03/20/2014    Lab Results  Component Value Date   WBC 6.5 04/24/2014   NEUTROABS 4.9 04/24/2014   HGB 10.2* 04/24/2014   HCT 30.4* 04/24/2014   MCV 93 04/24/2014   PLT 284 04/24/2014   Lab Results  Component Value Date   LABCA2 40.1* 10/19/2015     STUDIES: No results found.  ASSESSMENT: Stage IIIa triple positive breast cancer   PLAN:    1.  Breast cancer: Patient has now completed all of her treatments including XRT her. She only completed 14 of 18  infusions of Herceptin secondary to persistent nausea.  Continue letrozole completing 5 years of treatment in October 2020. Mammogram on July 05, 2015 was reported as BI-RADS 1, repeat in one year. Return to clinic in 3 months for laboratory work and then in 6 months for further evaluation. 2. Osteoporosis:  Bone mineral density on July 05, 2015 was reported as -2.2, which is improved from 1 year prior when it was reported as -2.6. Patient was instructed to continue calcium and vitamin D supplementation. Repeat bone mineral density in December 2017. 3.  Nausea: Likely secondary to anxiety. Previously, full imaging and GI workup was unrevealing. Continue current prescriptions as prescribed. 4.  Anxiety: Continue Xanax as needed. 5.  Headaches/tinnitus: Evaluation and treatment per neurology.   Patient expressed understanding and was in agreement with this plan. She also understands that She can call clinic at any time with any questions, concerns, or complaints.   Breast cancer of lower-inner quadrant of right female breast   Staging form: Breast, AJCC 7th Edition     Pathologic stage from 02/24/2015: Stage IIIA (T1c, N2b, cM0) - Signed by Lloyd Huger, MD on 02/24/2015   Lloyd Huger, MD   10/24/2015 6:28 PM

## 2015-11-11 ENCOUNTER — Other Ambulatory Visit: Payer: Self-pay | Admitting: Oncology

## 2015-12-30 ENCOUNTER — Ambulatory Visit
Admission: RE | Admit: 2015-12-30 | Discharge: 2015-12-30 | Disposition: A | Discharge status: Home / Self Care | Payer: Medicare Other | Source: Ambulatory Visit | Attending: Radiation Oncology | Admitting: Radiation Oncology

## 2015-12-30 ENCOUNTER — Encounter: Payer: Self-pay | Admitting: Radiation Oncology

## 2015-12-30 VITALS — BP 117/71 | HR 76 | Temp 98.0°F | Resp 19 | Wt 196.3 lb

## 2015-12-30 DIAGNOSIS — C50911 Malignant neoplasm of unspecified site of right female breast: Secondary | ICD-10-CM

## 2015-12-30 DIAGNOSIS — Z9221 Personal history of antineoplastic chemotherapy: Secondary | ICD-10-CM | POA: Diagnosis not present

## 2015-12-30 DIAGNOSIS — Z9011 Acquired absence of right breast and nipple: Secondary | ICD-10-CM | POA: Insufficient documentation

## 2015-12-30 DIAGNOSIS — Z79811 Long term (current) use of aromatase inhibitors: Secondary | ICD-10-CM | POA: Diagnosis not present

## 2015-12-30 DIAGNOSIS — Z170 Estrogen receptor positive status [ER+]: Secondary | ICD-10-CM | POA: Insufficient documentation

## 2015-12-30 DIAGNOSIS — Z923 Personal history of irradiation: Secondary | ICD-10-CM | POA: Insufficient documentation

## 2015-12-30 NOTE — Progress Notes (Signed)
Radiation Oncology Follow up Note  Name: Abigail Ortega   Date:   12/30/2015 MRN:  [id number] DOB: Mar 08, 1951    This 65 y.o. female presents to the clinic today for follow-up for stage IIIa breast cancer ER/PR positive HER-2/neu overexpressed status post modified radical mastectomy with adjuvant chemotherapy plus Herceptin now out 2 years from chest wall peripheral hepatic radiation.  REFERRING PROVIDER: Perrin Maltese, MD  HPI: Patient is a 65 year old female now out 2 years having completed right chest wall peripheral lymphatic radiation for a stage IIIa triple positive invasive mammary carcinoma status post modified radical mastectomy with adjuvant chemotherapy plus Herceptin. She is seen today in routine follow-up is doing well. She specifically denies any new chest wall nodularity or masses cough or bone pain..  COMPLICATIONS OF TREATMENT: none  FOLLOW UP COMPLIANCE: keeps appointments   PHYSICAL EXAM:  BP 117/71 mmHg  Pulse 76  Temp(Src) 98 F (36.7 C)  Resp 19  Wt 196 lb 5.1 oz (89.05 kg) She is status post right modified radical mastectomy. Left breast is free of dominant mass or nodularity in 2 positions examined. No evidence of right chest wall mass or nodularity is noted no lymphedema of right upper extremity is identified. No axillary or supraclavicular adenopathy is noted bilaterally. Well-developed well-nourished patient in NAD. HEENT reveals PERLA, EOMI, discs not visualized.  Oral cavity is clear. No oral mucosal lesions are identified. Neck is clear without evidence of cervical or supraclavicular adenopathy. Lungs are clear to A&P. Cardiac examination is essentially unremarkable with regular rate and rhythm without murmur rub or thrill. Abdomen is benign with no organomegaly or masses noted. Motor sensory and DTR levels are equal and symmetric in the upper and lower extremities. Cranial nerves II through XII are grossly intact. Proprioception is intact. No peripheral  adenopathy or edema is identified. No motor or sensory levels are noted. Crude visual fields are within normal range.  RADIOLOGY RESULTS: Mammogram from December 2016 of the left breast is reviewed and benign.  PLAN: Present time patient continues to do well with no evidence of disease. I am please were overall progress. I've asked to see her back in 1 year for follow-up. She continues on letrozole without side effect. I am please were overall progress. Patient knows to call sooner with any concerns.  I would like to take this opportunity to thank you for allowing me to participate in the care of your patient.Armstead Peaks., MD

## 2016-01-14 DIAGNOSIS — R51 Headache: Secondary | ICD-10-CM

## 2016-01-14 DIAGNOSIS — F32A Depression, unspecified: Secondary | ICD-10-CM | POA: Insufficient documentation

## 2016-01-14 DIAGNOSIS — I219 Acute myocardial infarction, unspecified: Secondary | ICD-10-CM | POA: Insufficient documentation

## 2016-01-14 DIAGNOSIS — M2550 Pain in unspecified joint: Secondary | ICD-10-CM | POA: Insufficient documentation

## 2016-01-14 DIAGNOSIS — F329 Major depressive disorder, single episode, unspecified: Secondary | ICD-10-CM | POA: Insufficient documentation

## 2016-01-14 DIAGNOSIS — R519 Headache, unspecified: Secondary | ICD-10-CM | POA: Insufficient documentation

## 2016-01-27 ENCOUNTER — Inpatient Hospital Stay: Payer: Medicare Other

## 2016-01-27 ENCOUNTER — Inpatient Hospital Stay: Payer: Medicare Other | Attending: Oncology

## 2016-01-27 ENCOUNTER — Inpatient Hospital Stay: Payer: Medicare Other | Admitting: Oncology

## 2016-01-27 ENCOUNTER — Inpatient Hospital Stay (HOSPITAL_BASED_OUTPATIENT_CLINIC_OR_DEPARTMENT_OTHER): Payer: Medicare Other | Admitting: Oncology

## 2016-01-27 VITALS — BP 137/84 | HR 79 | Temp 98.1°F

## 2016-01-27 DIAGNOSIS — Z87891 Personal history of nicotine dependence: Secondary | ICD-10-CM

## 2016-01-27 DIAGNOSIS — Z79811 Long term (current) use of aromatase inhibitors: Secondary | ICD-10-CM | POA: Insufficient documentation

## 2016-01-27 DIAGNOSIS — R51 Headache: Secondary | ICD-10-CM | POA: Insufficient documentation

## 2016-01-27 DIAGNOSIS — Z170 Estrogen receptor positive status [ER+]: Secondary | ICD-10-CM | POA: Diagnosis not present

## 2016-01-27 DIAGNOSIS — M810 Age-related osteoporosis without current pathological fracture: Secondary | ICD-10-CM | POA: Diagnosis not present

## 2016-01-27 DIAGNOSIS — Z9221 Personal history of antineoplastic chemotherapy: Secondary | ICD-10-CM | POA: Insufficient documentation

## 2016-01-27 DIAGNOSIS — K589 Irritable bowel syndrome without diarrhea: Secondary | ICD-10-CM | POA: Insufficient documentation

## 2016-01-27 DIAGNOSIS — I509 Heart failure, unspecified: Secondary | ICD-10-CM | POA: Diagnosis not present

## 2016-01-27 DIAGNOSIS — Z923 Personal history of irradiation: Secondary | ICD-10-CM | POA: Diagnosis not present

## 2016-01-27 DIAGNOSIS — Z800 Family history of malignant neoplasm of digestive organs: Secondary | ICD-10-CM | POA: Diagnosis not present

## 2016-01-27 DIAGNOSIS — H9319 Tinnitus, unspecified ear: Secondary | ICD-10-CM | POA: Diagnosis not present

## 2016-01-27 DIAGNOSIS — Z79899 Other long term (current) drug therapy: Secondary | ICD-10-CM

## 2016-01-27 DIAGNOSIS — Z95818 Presence of other cardiac implants and grafts: Secondary | ICD-10-CM | POA: Diagnosis not present

## 2016-01-27 DIAGNOSIS — I252 Old myocardial infarction: Secondary | ICD-10-CM | POA: Diagnosis not present

## 2016-01-27 DIAGNOSIS — Z7982 Long term (current) use of aspirin: Secondary | ICD-10-CM | POA: Diagnosis not present

## 2016-01-27 DIAGNOSIS — F419 Anxiety disorder, unspecified: Secondary | ICD-10-CM

## 2016-01-27 DIAGNOSIS — E079 Disorder of thyroid, unspecified: Secondary | ICD-10-CM

## 2016-01-27 DIAGNOSIS — Z803 Family history of malignant neoplasm of breast: Secondary | ICD-10-CM | POA: Diagnosis not present

## 2016-01-27 DIAGNOSIS — Z9011 Acquired absence of right breast and nipple: Secondary | ICD-10-CM | POA: Insufficient documentation

## 2016-01-27 DIAGNOSIS — C50311 Malignant neoplasm of lower-inner quadrant of right female breast: Secondary | ICD-10-CM | POA: Diagnosis present

## 2016-01-27 MED ORDER — HEPARIN SOD (PORK) LOCK FLUSH 100 UNIT/ML IV SOLN
INTRAVENOUS | Status: AC
  Filled 2016-01-27: qty 5

## 2016-01-27 MED ORDER — HEPARIN SOD (PORK) LOCK FLUSH 100 UNIT/ML IV SOLN
500.0000 [IU] | Freq: Once | INTRAVENOUS | Status: AC
  Administered 2016-01-27: 500 [IU] via INTRAVENOUS

## 2016-01-27 MED ORDER — SODIUM CHLORIDE 0.9% FLUSH
10.0000 mL | INTRAVENOUS | Status: DC | PRN
  Administered 2016-01-27: 10 mL via INTRAVENOUS
  Filled 2016-01-27: qty 10

## 2016-01-27 NOTE — Progress Notes (Signed)
[place]  [phone] [phone]) [phone]  ID: Durwin Reges OB: [birth year]  MR#: [id number]  YTK#:[id number]  Patient Care Team: Perrin Maltese, MD as PCP - General (Internal Medicine) Robert Bellow, MD (General Surgery) Sharene Butters, MD (General Surgery)  CHIEF COMPLAINT: Stage IIIa ER/PR positive, HER-2 overexpressing adenocarcinoma of the right breast status post mastectomy and axillary node dissection.    Chief Complaint  Patient presents with  . Breast Cancer  . Labs  and follow up    INTERVAL HISTORY: Patient returns to clinic today for routine 3 month evaluation. She has periodic episodes of tinnitus and headaches and is actively being evaluated by neurology. She has several other medical complaints including recent pneumonia and is seen by Dr Chancy Milroy. She is taking letrozole and is tolerating it well and without side effects. She has no other neurologic complaints. She denies any recent fevers. She has no chest pain or shortness of breath. She denies any nausea vomiting, constipation, or diarrhea. She has no urinary complaints.  Patient offers no further specific complaints today.  REVIEW OF SYSTEMS:   Review of Systems  Constitutional: Negative for fever, chills, weight loss and malaise/fatigue.  HENT: Positive for tinnitus.   Eyes: Negative.   Respiratory: Negative.  Negative for cough and sputum production.   Cardiovascular: Negative.  Negative for chest pain.  Gastrointestinal: Negative.   Genitourinary: Negative.   Skin: Negative for itching.  Neurological: Positive for headaches. Negative for sensory change and weakness.  Psychiatric/Behavioral: The patient is nervous/anxious.     As per HPI. Otherwise, a complete review of systems is negatve.  PAST MEDICAL HISTORY: Past Medical History  Diagnosis Date  . CHF (congestive heart failure) ([place]) 2013  . IBS (irritable bowel syndrome)   . Thyroid disease   . Hemorrhoids     . Clotting disorder ([place])     blood clots  . Heart attack [facility]) 2012    coronary stent x2 completed by Dr. Clayborn Bigness.  . Malignant neoplasm of lower-inner quadrant of female breast [facility]) January 20, 2013.    invasive mammary cancer, minimal 0.85 cm.  Histologic grade 1.  . Heart attack ([place]) Apr 29, 2013  . Breast cancer [facility]) 2014    Right breast cancer - chemo, radiation and Mastectomy    PAST SURGICAL HISTORY: Past Surgical History  Procedure Laterality Date  . Vascular surgery    . Colonoscopy w/ biopsies  09/15/2010    Colonoscopy completed by Verdie Shire, M.D. Tubular adenoma of the ascending colon and descending colon reported up to 0.4 cm in diameter. No atypia.  Marland Kitchen Upper gi endoscopy  09/15/2010      Completed by Verdie Shire, M.D. for nausea. Normal exam reported.  . Breast surgery Right 02-21-2013    right mastectomy  . Mastectomy Right 2014    FAMILY HISTORY Family History  Problem Relation Age of Onset  . Cancer Mother 81    breast  . Breast cancer Mother   . Cancer Father     colon       ADVANCED DIRECTIVES:    HEALTH MAINTENANCE: Social History  Substance Use Topics  . Smoking status: Former Smoker -- 20 years  . Smokeless tobacco: Never Used  . Alcohol Use: No     Colonoscopy:  PAP:  Bone density:  Lipid panel:  No Known Allergies  Current Outpatient Prescriptions  Medication Sig Dispense Refill  . ALPRAZolam (XANAX) 0.25 MG tablet Take 0.25 mg by mouth  2 (two) times daily.  2  . aspirin EC 81 MG tablet Take by mouth.    Marland Kitchen atorvastatin (LIPITOR) 40 MG tablet Take 1 tablet by mouth once daily    . Cholecalciferol (VITAMIN D-1000 MAX ST) 1000 UNITS tablet Take by mouth.    . clopidogrel (PLAVIX) 75 MG tablet Take by mouth.    . cyclobenzaprine (FLEXERIL) 10 MG tablet Reported on 07/15/2015    . Fe Fum-FA-B Cmp-C-Zn-Mg-Mn-Cu (HEMOCYTE PLUS) 106-1 MG CAPS Take 1 capsule by mouth daily. Reported on 07/15/2015  6  . ferrous sulfate 325 (65 FE) MG tablet 2  (two) times daily.  6  . gabapentin (NEURONTIN) 300 MG capsule     . ibuprofen (ADVIL,MOTRIN) 600 MG tablet Take 600 mg by mouth every 6 (six) hours as needed. Reported on 07/15/2015    . letrozole (FEMARA) 2.5 MG tablet Take 1 tablet (2.5 mg total) by mouth daily. 90 tablet 3  . levothyroxine (SYNTHROID, LEVOTHROID) 88 MCG tablet     . loratadine (CLARITIN) 10 MG tablet Take 10 mg by mouth daily. Reported on 07/15/2015    . pantoprazole (PROTONIX) 40 MG tablet Take 1 tablet by mouth  daily 90 tablet 1  . ramipril (ALTACE) 5 MG capsule Take 1 capsule by mouth daily.    . traZODone (DESYREL) 50 MG tablet     . venlafaxine XR (EFFEXOR-XR) 150 MG 24 hr capsule      No current facility-administered medications for this visit.   Facility-Administered Medications Ordered in Other Visits  Medication Dose Route Frequency Provider Last Rate Last Dose  . sodium chloride 0.9 % injection 10 mL  10 mL Intravenous PRN Lloyd Huger, MD   10 mL at 02/24/15 0951  . sodium chloride flush (NS) 0.9 % injection 10 mL  10 mL Intravenous PRN Evlyn Kanner, NP   10 mL at 01/27/16 1446    OBJECTIVE: Filed Vitals:   01/27/16 1536  BP: 137/84  Pulse: 79  Temp: 98.1 F (36.7 C)     There is no weight on file to calculate BMI.    ECOG FS:0 - Asymptomatic  General: Well-developed, well-nourished, no acute distress. Eyes: Pink conjunctiva, anicteric sclera. Breasts: Patient declined. Has breast exam with Dr Donella Stade  Lungs: Clear to auscultation bilaterally. Heart: Regular rate and rhythm. No rubs, murmurs, or gallops. Abdomen: Soft, nontender, nondistended. No organomegaly noted, normoactive bowel sounds. Musculoskeletal: No edema, cyanosis, or clubbing. Neuro: Alert, answering all questions appropriately. Cranial nerves grossly intact. Skin: No rashes or petechiae noted. Psych: Normal affect.  LAB RESULTS:  Lab Results  Component Value Date   NA 135* 03/20/2014   K 3.4* 03/20/2014   CL 101  03/20/2014   CO2 28 03/20/2014   GLUCOSE 112* 03/20/2014   BUN 8 03/20/2014   CREATININE 1.30 03/20/2014   CALCIUM 9.6 03/20/2014   PROT 6.9 03/20/2014   ALBUMIN 3.4 03/20/2014   AST 17 03/20/2014   ALT 23 03/20/2014   ALKPHOS 110 03/20/2014   BILITOT 0.4 03/20/2014   GFRNONAA 43* 03/20/2014   GFRAA 50* 03/20/2014    Lab Results  Component Value Date   WBC 6.5 04/24/2014   NEUTROABS 4.9 04/24/2014   HGB 10.2* 04/24/2014   HCT 30.4* 04/24/2014   MCV 93 04/24/2014   PLT 284 04/24/2014   Lab Results  Component Value Date   LABCA2 40.1* 10/19/2015     STUDIES: No results found.  ASSESSMENT: Stage IIIa triple positive breast cancer  PLAN:    1.  Breast cancer: Patient has now completed all of her treatments including XRT her. She only completed 14 of 18 infusions of Herceptin secondary to persistent nausea. Her CA 27.29 was up to 40.1 in April 2017, pending for today. Continue letrozole completing 5 years of treatment in October 2020. Mammogram on July 05, 2015 was reported as BI-RADS 1, repeat in one year. Return to clinic in 6 months for laboratory work and further evaluation. 2. Osteoporosis:  Bone mineral density on July 05, 2015 was reported as -2.2, which is improved from 1 year prior when it was reported as -2.6. Patient was instructed to continue calcium and vitamin D supplementation. Repeat bone mineral density in December 2017. 3.  Anxiety: Continue Xanax as needed. 4.  Headaches/tinnitus: Ttreatment per neurology.   Patient expressed understanding and was in agreement with this plan. She also understands that She can call clinic at any time with any questions, concerns, or complaints.   Breast cancer of lower-inner quadrant of right female breast   Staging form: Breast, AJCC 7th Edition     Pathologic stage from 02/24/2015: Stage IIIA (T1c, N2b, cM0) - Signed by Lloyd Huger, MD on 02/24/2015  Dr. Grayland Ormond was available for consultation and review  of plan of care for this patient.  Mayra Reel, NP   01/27/2016 4:11 PM

## 2016-01-27 NOTE — Progress Notes (Signed)
Patient here for follow up. She has multiple complaints of HA/ sinus problems, states she "she feels bad all the time".

## 2016-01-28 LAB — CANCER ANTIGEN 27.29: CA 27.29: 32.6 U/mL (ref 0.0–38.6)

## 2016-03-16 ENCOUNTER — Other Ambulatory Visit: Payer: Self-pay | Admitting: Oncology

## 2016-03-16 ENCOUNTER — Inpatient Hospital Stay: Payer: Medicare Other | Attending: Oncology

## 2016-03-22 ENCOUNTER — Other Ambulatory Visit: Payer: Self-pay | Admitting: Internal Medicine

## 2016-03-22 DIAGNOSIS — R109 Unspecified abdominal pain: Secondary | ICD-10-CM

## 2016-03-27 ENCOUNTER — Other Ambulatory Visit: Payer: Medicare Other

## 2016-03-30 ENCOUNTER — Ambulatory Visit
Admission: RE | Admit: 2016-03-30 | Discharge: 2016-03-30 | Disposition: A | Discharge status: Home / Self Care | Payer: Medicare Other | Source: Ambulatory Visit | Attending: Internal Medicine | Admitting: Internal Medicine

## 2016-03-30 DIAGNOSIS — R109 Unspecified abdominal pain: Secondary | ICD-10-CM | POA: Insufficient documentation

## 2016-04-05 ENCOUNTER — Other Ambulatory Visit: Payer: Self-pay | Admitting: Internal Medicine

## 2016-04-05 DIAGNOSIS — G44221 Chronic tension-type headache, intractable: Secondary | ICD-10-CM

## 2016-04-11 ENCOUNTER — Emergency Department
Admission: EM | Admit: 2016-04-11 | Discharge: 2016-04-11 | Disposition: A | Discharge status: Home / Self Care | Payer: Medicare Other | Attending: Emergency Medicine | Admitting: Emergency Medicine

## 2016-04-11 ENCOUNTER — Encounter: Payer: Self-pay | Admitting: Emergency Medicine

## 2016-04-11 DIAGNOSIS — G8929 Other chronic pain: Secondary | ICD-10-CM | POA: Diagnosis not present

## 2016-04-11 DIAGNOSIS — R51 Headache: Secondary | ICD-10-CM | POA: Diagnosis not present

## 2016-04-11 DIAGNOSIS — R0981 Nasal congestion: Secondary | ICD-10-CM | POA: Insufficient documentation

## 2016-04-11 DIAGNOSIS — Z853 Personal history of malignant neoplasm of breast: Secondary | ICD-10-CM | POA: Insufficient documentation

## 2016-04-11 DIAGNOSIS — I509 Heart failure, unspecified: Secondary | ICD-10-CM | POA: Diagnosis not present

## 2016-04-11 DIAGNOSIS — R040 Epistaxis: Secondary | ICD-10-CM | POA: Diagnosis present

## 2016-04-11 DIAGNOSIS — Z87891 Personal history of nicotine dependence: Secondary | ICD-10-CM | POA: Insufficient documentation

## 2016-04-11 DIAGNOSIS — Z7982 Long term (current) use of aspirin: Secondary | ICD-10-CM | POA: Insufficient documentation

## 2016-04-11 DIAGNOSIS — Z791 Long term (current) use of non-steroidal anti-inflammatories (NSAID): Secondary | ICD-10-CM | POA: Diagnosis not present

## 2016-04-11 DIAGNOSIS — Z7901 Long term (current) use of anticoagulants: Secondary | ICD-10-CM | POA: Diagnosis not present

## 2016-04-11 MED ORDER — OXYMETAZOLINE HCL 0.05 % NA SOLN: 2.0000 | NASAL | 2 refills | Status: DC | PRN

## 2016-04-11 NOTE — Discharge Instructions (Signed)
Do not use your other nasal spray for 3-4 days. You may apply vaseline to the inside of your nose with a Q-tip very carefully.

## 2016-04-11 NOTE — ED Triage Notes (Signed)
Pt in with co cough and congestion, today coughed forcefully and developed a nosebleed. No bleeding noted at this time no distress.

## 2016-04-11 NOTE — ED Notes (Addendum)
Pt reports that she had a nose bleed approx one hour ago - pt reports that she has nasal congestion and when she blew her nose her nose began to bleed - pt has been coughing for the last year - today during the nose bleed pt reports she was "coughing up clots" - at this time nose has stopped bleeding - pt reports that she has felt "light headed all day"

## 2016-04-11 NOTE — ED Provider Notes (Signed)
[facility] Emergency Department Provider Note  ____________________________________________   First MD Initiated Contact with Patient 04/11/16 2013     (approximate)  I have reviewed the triage vital signs and the nursing notes.   HISTORY  Chief Complaint Epistaxis   HPI Abigail Ortega is a 65 y.o. female who presents after nosebleed earlier tonight. Patient states she blew her nose earlier this evening and began profusely bleeding from her left nostril. Reports she was able to get it to stop initially, but then it bled again a few minutes later. Patient was able to stop it the second time with holding pressure over the bony part of her nose. Nose has not bled in 45 minutes. Was spitting out some large clots as well. Patient is on plavix. Has chronic congestion and is getting MRI to examine sinuses on Thursday. Uses a daily nasal spray. Denies dizziness, chest pain, SOB, change in vision.    Past Medical History:  Diagnosis Date  . Breast cancer [facility]) 2014   Right breast cancer - chemo, radiation and Mastectomy  . CHF (congestive heart failure) ([place]) 2013  . Clotting disorder ([place])    blood clots  . Heart attack [facility]) 2012   coronary stent x2 completed by Dr. Clayborn Bigness.  Marland Kitchen Heart attack ([place]) Apr 29, 2013  . Hemorrhoids   . IBS (irritable bowel syndrome)   . Malignant neoplasm of lower-inner quadrant of female breast [facility]) January 20, 2013.   invasive mammary cancer, minimal 0.85 cm.  Histologic grade 1.  . Thyroid disease     Patient Active Problem List   Diagnosis Date Noted  . Acquired absence of breast 03/04/2013  . Congestive heart failure ([place]) 03/04/2013  . Malignant neoplasm of lower-inner quadrant of female breast ([place]) 03/04/2013  . Breast cancer of lower-inner quadrant of right female breast ([place]) 01/28/2013    Past Surgical History:  Procedure Laterality Date  . BREAST SURGERY Right 02-21-2013   right mastectomy  . COLONOSCOPY W/  BIOPSIES  09/15/2010   Colonoscopy completed by Verdie Shire, M.D. Tubular adenoma of the ascending colon and descending colon reported up to 0.4 cm in diameter. No atypia.  Marland Kitchen MASTECTOMY Right 2014  . UPPER GI ENDOSCOPY  09/15/2010     Completed by Verdie Shire, M.D. for nausea. Normal exam reported.  Marland Kitchen VASCULAR SURGERY     . Prior to Admission medications   Medication Sig Start Date End Date Taking? Authorizing Provider  ALPRAZolam (XANAX) 0.25 MG tablet Take 0.25 mg by mouth 2 (two) times daily. 04/23/15   Historical Provider, MD  aspirin EC 81 MG tablet Take by mouth.    Historical Provider, MD  atorvastatin (LIPITOR) 40 MG tablet Take 1 tablet by mouth once daily 06/08/15   Historical Provider, MD  Cholecalciferol (VITAMIN D-1000 MAX ST) 1000 UNITS tablet Take by mouth.    Historical Provider, MD  clopidogrel (PLAVIX) 75 MG tablet Take by mouth. 10/26/14   Historical Provider, MD  cyclobenzaprine (FLEXERIL) 10 MG tablet Reported on 07/15/2015 11/16/14   Historical Provider, MD  Fe Fum-FA-B Cmp-C-Zn-Mg-Mn-Cu (HEMOCYTE PLUS) 106-1 MG CAPS Take 1 capsule by mouth daily. Reported on 07/15/2015 01/14/15   Historical Provider, MD  ferrous sulfate 325 (65 FE) MG tablet 2 (two) times daily. 06/14/15   Historical Provider, MD  gabapentin (NEURONTIN) 300 MG capsule  09/10/14   Historical Provider, MD  ibuprofen (ADVIL,MOTRIN) 600 MG tablet Take 600 mg by mouth every 6 (six) hours as needed. Reported  on 07/15/2015    Historical Provider, MD  letrozole [facility]) 2.5 MG tablet Take 1 tablet by mouth  daily 03/16/16   Lloyd Huger, MD  levothyroxine (SYNTHROID, LEVOTHROID) 88 MCG tablet  02/23/15   Historical Provider, MD  loratadine (CLARITIN) 10 MG tablet Take 10 mg by mouth daily. Reported on 07/15/2015    Historical Provider, MD  oxymetazoline (AFRIN) 0.05 % nasal spray Place 2 sprays into left nostril as needed for congestion (For nosebleed). 04/11/16 04/11/17  Pierce Crane Beers, PA-C  pantoprazole (PROTONIX) 40 MG  tablet Take 1 tablet by mouth  daily 03/16/16   Lloyd Huger, MD  ramipril (ALTACE) 5 MG capsule Take 1 capsule by mouth daily. 01/14/13   Historical Provider, MD  traZODone (DESYREL) 50 MG tablet  10/05/14   Historical Provider, MD  venlafaxine XR (EFFEXOR-XR) 150 MG 24 hr capsule  11/23/14   Historical Provider, MD    Allergies Review of patient's allergies indicates no known allergies.  Family History  Problem Relation Age of Onset  . Cancer Mother 30    breast  . Breast cancer Mother   . Cancer Father     colon    Social History Social History  Substance Use Topics  . Smoking status: Former Smoker    Years: 20.00  . Smokeless tobacco: Never Used  . Alcohol use No    Review of Systems Constitutional: No fever/chills Eyes: No visual changes. ENT: No sore throat. Chronic nasal and sinus congestion.  Cardiovascular: Denies chest pain. Respiratory: Denies shortness of breath. Gastrointestinal: No abdominal pain.  No nausea, no vomiting.   Neurological: Negative for  focal weakness or numbness. Positive for chronic headaches.  ____________________________________________   PHYSICAL EXAM:  VITAL SIGNS: ED Triage Vitals  Enc Vitals Group     BP 04/11/16 1939 129/75     Pulse Rate 04/11/16 1939 68     Resp 04/11/16 1939 18     Temp 04/11/16 1939 98.6 F (37 C)     Temp Source 04/11/16 1939 Oral     SpO2 04/11/16 1939 98 %     Weight 04/11/16 1939 190 lb (86.2 kg)     Height 04/11/16 1939 5\' 7"  (1.702 m)     Head Circumference --      Peak Flow --      Pain Score 04/11/16 2006 0     Pain Loc --      Pain Edu? --      Excl. in [place]? --     Constitutional: Alert and oriented. Well appearing and in no acute distress. Eyes: Conjunctivae are normal.  Head: Atraumatic. Nose: No congestion/rhinnorhea present on exam. No septal hematoma. No visible clot or dried blood bilaterally.  Mouth/Throat: Mucous membranes are moist.  Oropharynx non-erythematous without exudate  or dried blood. Neck: No stridor. Supple, full ROM without pain or difficulty.  Hematological/Lymphatic/Immunilogical: No cervical lymphadenopathy. Cardiovascular: Normal rate, regular rhythm. Grossly normal heart sounds.  Good peripheral circulation. Respiratory: Normal respiratory effort.  No retractions. Lungs CTAB. Musculoskeletal: Full ROM in all extremities without pain or difficulty.  Neurologic:  Normal speech and language. No gross focal neurologic deficits are appreciated. No gait instability. Skin:  Skin is warm, dry and intact. No rash noted. Psychiatric: Mood and affect are normal. Speech and behavior are normal.  ____________________________________________   LABS (all labs ordered are listed, but only abnormal results are displayed)  Labs Reviewed - No data to display ____________________________________________  EKG  None  ____________________________________________  RADIOLOGY  None ____________________________________________   PROCEDURES  Procedure(s) performed: None  Procedures  Critical Care performed: No  ____________________________________________   INITIAL IMPRESSION / ASSESSMENT AND PLAN / ED COURSE  Pertinent labs & imaging results that were available during my care of the patient were reviewed by me and considered in my medical decision making (see chart for details).  Patient presented to the ED after episode of epistaxis. Patient on daily plavix. No bleeding at time of examination. Given prescription for Afrin nasal spray and instructed on proper use. Instructed to stop her other nasal spray for 3-4 days. Given strict return instructions. No other emergency medicine complaints at this time.   Clinical Course     ____________________________________________   FINAL CLINICAL IMPRESSION(S) / ED DIAGNOSES  Final diagnoses:  Epistaxis  Anticoagulated      NEW MEDICATIONS STARTED DURING THIS VISIT:  Discharge Medication List as  of 04/11/2016  8:58 PM    START taking these medications   Details  oxymetazoline (AFRIN) 0.05 % nasal spray Place 2 sprays into left nostril as needed for congestion (For nosebleed)., Starting Tue 04/11/2016, Until Wed 04/11/2017, Print         Note:  This document was prepared using Dragon voice recognition software and may include unintentional dictation errors.   Arlyss Repress, PA-C 04/11/16 2138    Eula Listen, MD 04/11/16 2217

## 2016-04-14 ENCOUNTER — Ambulatory Visit
Admission: RE | Admit: 2016-04-14 | Discharge: 2016-04-14 | Disposition: A | Discharge status: Home / Self Care | Payer: Medicare Other | Source: Ambulatory Visit | Attending: Internal Medicine | Admitting: Internal Medicine

## 2016-04-14 DIAGNOSIS — G44221 Chronic tension-type headache, intractable: Secondary | ICD-10-CM | POA: Insufficient documentation

## 2016-04-20 ENCOUNTER — Ambulatory Visit: Payer: Medicare Other | Admitting: Oncology

## 2016-04-20 ENCOUNTER — Other Ambulatory Visit: Payer: Medicare Other

## 2016-04-25 ENCOUNTER — Inpatient Hospital Stay: Payer: Medicare Other | Attending: Oncology

## 2016-07-25 ENCOUNTER — Inpatient Hospital Stay: Payer: Medicare Other

## 2016-07-25 ENCOUNTER — Ambulatory Visit: Payer: Medicare Other | Admitting: Oncology

## 2016-07-25 ENCOUNTER — Other Ambulatory Visit: Payer: Medicare Other

## 2016-07-25 ENCOUNTER — Inpatient Hospital Stay: Payer: Medicare Other | Admitting: Oncology

## 2016-07-31 NOTE — Progress Notes (Deleted)
[place]  [phone]) [phone] Fax:(336) [phone]  ID: Abigail Ortega OB: May 12, 1951  MR#: [id number]  [id number]  Patient Care Team: Perrin Maltese, MD as PCP - General (Internal Medicine) Robert Bellow, MD (General Surgery) Sharene Butters, MD (General Surgery)  CHIEF COMPLAINT: Pathologic stage IIIa triple positive invasive carcinoma of the lower inner quadrant of the right breast.   INTERVAL HISTORY: Patient returns to clinic today for routine 3 month evaluation. She has periodic episodes of tinnitus and headaches and is actively being evaluated by neurology. She has several other medical complaints including recent pneumonia and is seen by Dr Chancy Milroy. She is taking letrozole and is tolerating it well and without side effects. She has no other neurologic complaints. She denies any recent fevers. She has no chest pain or shortness of breath. She denies any nausea vomiting, constipation, or diarrhea. She has no urinary complaints.  Patient offers no further specific complaints today.  REVIEW OF SYSTEMS:   Review of Systems  Constitutional: Negative for chills, fever, malaise/fatigue and weight loss.  HENT: Positive for tinnitus.   Eyes: Negative.   Respiratory: Negative.  Negative for cough and sputum production.   Cardiovascular: Negative.  Negative for chest pain.  Gastrointestinal: Negative.   Genitourinary: Negative.   Skin: Negative for itching.  Neurological: Positive for headaches. Negative for sensory change and weakness.  Psychiatric/Behavioral: The patient is nervous/anxious.     As per HPI. Otherwise, a complete review of systems is negatve.  PAST MEDICAL HISTORY: Past Medical History:  Diagnosis Date  . Breast cancer [facility]) 2014   Right breast cancer - chemo, radiation and Mastectomy  . CHF (congestive heart failure) ([place]) 2013  . Clotting disorder ([place])    blood clots  . Heart attack 2012   coronary stent x2 completed by Dr.  Clayborn Bigness.  Marland Kitchen Heart attack Apr 29, 2013  . Hemorrhoids   . IBS (irritable bowel syndrome)   . Malignant neoplasm of lower-inner quadrant of female breast [facility]) January 20, 2013.   invasive mammary cancer, minimal 0.85 cm.  Histologic grade 1.  . Thyroid disease     PAST SURGICAL HISTORY: Past Surgical History:  Procedure Laterality Date  . BREAST SURGERY Right 02-21-2013   right mastectomy  . COLONOSCOPY W/ BIOPSIES  09/15/2010   Colonoscopy completed by Verdie Shire, M.D. Tubular adenoma of the ascending colon and descending colon reported up to 0.4 cm in diameter. No atypia.  Marland Kitchen MASTECTOMY Right 2014  . UPPER GI ENDOSCOPY  09/15/2010     Completed by Verdie Shire, M.D. for nausea. Normal exam reported.  Marland Kitchen VASCULAR SURGERY      FAMILY HISTORY Family History  Problem Relation Age of Onset  . Cancer Mother 43    breast  . Breast cancer Mother   . Cancer Father     colon       ADVANCED DIRECTIVES:    HEALTH MAINTENANCE: Social History  Substance Use Topics  . Smoking status: Former Smoker    Years: 20.00  . Smokeless tobacco: Never Used  . Alcohol use No     Colonoscopy:  PAP:  Bone density:  Lipid panel:  No Known Allergies  Current Outpatient Prescriptions  Medication Sig Dispense Refill  . ALPRAZolam (XANAX) 0.25 MG tablet Take 0.25 mg by mouth 2 (two) times daily.  2  . aspirin EC 81 MG tablet Take by mouth.    Marland Kitchen atorvastatin (LIPITOR) 40 MG tablet Take 1 tablet by mouth once  daily    . Cholecalciferol (VITAMIN D-1000 MAX ST) 1000 UNITS tablet Take by mouth.    . clopidogrel (PLAVIX) 75 MG tablet Take by mouth.    . cyclobenzaprine (FLEXERIL) 10 MG tablet Reported on 07/15/2015    . Fe Fum-FA-B Cmp-C-Zn-Mg-Mn-Cu (HEMOCYTE PLUS) 106-1 MG CAPS Take 1 capsule by mouth daily. Reported on 07/15/2015  6  . ferrous sulfate 325 (65 FE) MG tablet 2 (two) times daily.  6  . gabapentin (NEURONTIN) 300 MG capsule     . ibuprofen (ADVIL,MOTRIN) 600 MG tablet Take 600 mg by mouth  every 6 (six) hours as needed. Reported on 07/15/2015    . letrozole (FEMARA) 2.5 MG tablet Take 1 tablet by mouth  daily 90 tablet 1  . levothyroxine (SYNTHROID, LEVOTHROID) 88 MCG tablet     . loratadine (CLARITIN) 10 MG tablet Take 10 mg by mouth daily. Reported on 07/15/2015    . oxymetazoline (AFRIN) 0.05 % nasal spray Place 2 sprays into left nostril as needed for congestion (For nosebleed). 15 mL 2  . pantoprazole (PROTONIX) 40 MG tablet Take 1 tablet by mouth  daily 90 tablet 1  . ramipril (ALTACE) 5 MG capsule Take 1 capsule by mouth daily.    . traZODone (DESYREL) 50 MG tablet     . venlafaxine XR (EFFEXOR-XR) 150 MG 24 hr capsule      No current facility-administered medications for this visit.    Facility-Administered Medications Ordered in Other Visits  Medication Dose Route Frequency Provider Last Rate Last Dose  . sodium chloride 0.9 % injection 10 mL  10 mL Intravenous PRN Lloyd Huger, MD   10 mL at 02/24/15 0951  . sodium chloride flush (NS) 0.9 % injection 10 mL  10 mL Intravenous PRN Evlyn Kanner, NP   10 mL at 01/27/16 1446    OBJECTIVE: There were no vitals filed for this visit.   There is no height or weight on file to calculate BMI.    ECOG FS:0 - Asymptomatic  General: Well-developed, well-nourished, no acute distress. Eyes: Pink conjunctiva, anicteric sclera. Breasts: Patient declined. Has breast exam with Dr Donella Stade  Lungs: Clear to auscultation bilaterally. Heart: Regular rate and rhythm. No rubs, murmurs, or gallops. Abdomen: Soft, nontender, nondistended. No organomegaly noted, normoactive bowel sounds. Musculoskeletal: No edema, cyanosis, or clubbing. Neuro: Alert, answering all questions appropriately. Cranial nerves grossly intact. Skin: No rashes or petechiae noted. Psych: Normal affect.  LAB RESULTS:  Lab Results  Component Value Date   NA 135 (L) 03/20/2014   K 3.4 (L) 03/20/2014   CL 101 03/20/2014   CO2 28 03/20/2014   GLUCOSE  112 (H) 03/20/2014   BUN 8 03/20/2014   CREATININE 1.30 03/20/2014   CALCIUM 9.6 03/20/2014   PROT 6.9 03/20/2014   ALBUMIN 3.4 03/20/2014   AST 17 03/20/2014   ALT 23 03/20/2014   ALKPHOS 110 03/20/2014   BILITOT 0.4 03/20/2014   GFRNONAA 43 (L) 03/20/2014   GFRAA 50 (L) 03/20/2014    Lab Results  Component Value Date   WBC 6.5 04/24/2014   NEUTROABS 4.9 04/24/2014   HGB 10.2 (L) 04/24/2014   HCT 30.4 (L) 04/24/2014   MCV 93 04/24/2014   PLT 284 04/24/2014   Lab Results  Component Value Date   LABCA2 32.6 01/27/2016     STUDIES: No results found.  ASSESSMENT: Pathologic stage IIIa triple positive invasive carcinoma of the lower inner quadrant of the right breast.   PLAN:  1. Pathologic stage IIIa triple positive invasive carcinoma of the lower inner quadrant of the right breast: Patient has now completed all of her treatments including XRT her. She only completed 14 of 18 infusions of Herceptin secondary to persistent nausea. Her CA 27.29 was up to 40.1 in April 2017, pending for today. Continue letrozole completing 5 years of treatment in October 2020. Mammogram on July 05, 2015 was reported as BI-RADS 1, repeat in one year. Return to clinic in 6 months for laboratory work and further evaluation. 2. Osteoporosis:  Bone mineral density on July 05, 2015 was reported as -2.2, which is improved from 1 year prior when it was reported as -2.6. Patient was instructed to continue calcium and vitamin D supplementation. Repeat bone mineral density in December 2017. 3.  Anxiety: Continue Xanax as needed. 4.  Headaches/tinnitus: Ttreatment per neurology.   Patient expressed understanding and was in agreement with this plan. She also understands that She can call clinic at any time with any questions, concerns, or complaints.   Breast cancer of lower-inner quadrant of right female breast   Staging form: Breast, AJCC 7th Edition     Pathologic stage from 02/24/2015: Stage  IIIA (T1c, N2b, cM0) - Signed by Lloyd Huger, MD on 02/24/2015  Dr. Grayland Ormond was available for consultation and review of plan of care for this patient.  Lloyd Huger, MD   07/31/2016 10:45 PM

## 2016-08-03 ENCOUNTER — Other Ambulatory Visit: Payer: Medicare Other

## 2016-08-03 ENCOUNTER — Ambulatory Visit: Payer: Medicare Other | Admitting: Oncology

## 2016-08-04 ENCOUNTER — Other Ambulatory Visit: Payer: Self-pay | Admitting: Oncology

## 2016-08-07 NOTE — Progress Notes (Signed)
[place]  Telephone:(336) [phone] Fax:(336) [phone]  ID: Durwin Reges OB: 22-Feb-1951  MR#: [id number]  [id number]  Patient Care Team: Perrin Maltese, MD as PCP - General (Internal Medicine) Robert Bellow, MD (General Surgery) Sharene Butters, MD (General Surgery)  CHIEF COMPLAINT: Pathologic stage IIIa triple positive invasive carcinoma of the lower inner quadrant of the right breast.   INTERVAL HISTORY: Patient returns to clinic today for routine 6 month evaluation, last seen in clinic in April 2017. She has periodic episodes of tinnitus and headaches and is actively being evaluated by neurology. She has several other medical complaints including recent flu and current body aches, and is being seen by Dr Chancy Milroy.  She is taking letrozole and is tolerating it well and without side effects. She has no other neurologic complaints. She reports chronic insomnia due to overactive thoughts. She denies any recent fevers. She has no chest pain or shortness of breath. She reports occasional nausea, self resolving.  She also reports occasional constipation, relieved with miralax, and denies any vomiting or diarrhea. She has no urinary complaints.  Patient offers no further specific complaints today.  REVIEW OF SYSTEMS:   Review of Systems  Constitutional: Negative for chills, fever, malaise/fatigue and weight loss.  HENT: Positive for tinnitus.   Eyes: Negative.   Respiratory: Negative.  Negative for cough and sputum production.   Cardiovascular: Negative.  Negative for chest pain.  Gastrointestinal: Positive for constipation and nausea. Negative for abdominal pain, diarrhea and vomiting.  Genitourinary: Negative.   Musculoskeletal: Positive for myalgias.  Skin: Negative for itching.  Neurological: Positive for headaches. Negative for sensory change and weakness.  Psychiatric/Behavioral: The patient is nervous/anxious and has insomnia.     As per HPI. Otherwise,  a complete review of systems is negative.  PAST MEDICAL HISTORY: Past Medical History:  Diagnosis Date  . Breast cancer [facility]) 2014   Right breast cancer - chemo, radiation and Mastectomy  . CHF (congestive heart failure) ([place]) 2013  . Clotting disorder ([place])    blood clots  . Heart attack 2012   coronary stent x2 completed by Dr. Clayborn Bigness.  Marland Kitchen Heart attack Apr 29, 2013  . Hemorrhoids   . IBS (irritable bowel syndrome)   . Malignant neoplasm of lower-inner quadrant of female breast [facility]) January 20, 2013.   invasive mammary cancer, minimal 0.85 cm.  Histologic grade 1.  . Thyroid disease     PAST SURGICAL HISTORY: Past Surgical History:  Procedure Laterality Date  . BREAST SURGERY Right 02-21-2013   right mastectomy  . COLONOSCOPY W/ BIOPSIES  09/15/2010   Colonoscopy completed by Verdie Shire, M.D. Tubular adenoma of the ascending colon and descending colon reported up to 0.4 cm in diameter. No atypia.  Marland Kitchen MASTECTOMY Right 2014  . UPPER GI ENDOSCOPY  09/15/2010     Completed by Verdie Shire, M.D. for nausea. Normal exam reported.  Marland Kitchen VASCULAR SURGERY      FAMILY HISTORY Family History  Problem Relation Age of Onset  . Cancer Mother 82    breast  . Breast cancer Mother   . Cancer Father     colon       ADVANCED DIRECTIVES:    HEALTH MAINTENANCE: Social History  Substance Use Topics  . Smoking status: Former Smoker    Years: 20.00  . Smokeless tobacco: Never Used  . Alcohol use No     Colonoscopy:  PAP:  Bone density:  Lipid panel:  No Known Allergies  Current Outpatient Prescriptions  Medication Sig Dispense Refill  . acetaminophen (TYLENOL 8 HOUR ARTHRITIS PAIN) 650 MG CR tablet Take 650 mg by mouth every 8 (eight) hours as needed for pain.    Marland Kitchen ALPRAZolam (XANAX) 0.25 MG tablet Take 0.25 mg by mouth daily.   2  . amLODipine (NORVASC) 10 MG tablet Take 10 mg by mouth daily.    Marland Kitchen aspirin EC 81 MG tablet Take by mouth.    Marland Kitchen atorvastatin (LIPITOR) 40 MG tablet Take 1  tablet by mouth once daily    . Calcium 600-200 MG-UNIT tablet Take 1 tablet by mouth daily.    . Cholecalciferol (VITAMIN D-1000 MAX ST) 1000 UNITS tablet Take by mouth.    . clopidogrel (PLAVIX) 75 MG tablet Take by mouth daily.     . ferrous sulfate 325 (65 FE) MG tablet 2 (two) times daily.  6  . letrozole (FEMARA) 2.5 MG tablet TAKE 1 TABLET BY MOUTH  DAILY 90 tablet 2  . levothyroxine (SYNTHROID, LEVOTHROID) 75 MCG tablet Take 75 mcg by mouth daily before breakfast.    . Magnesium 250 MG TABS Take 250 mg by mouth daily.    . montelukast (SINGULAIR) 10 MG tablet Take 10 mg by mouth at bedtime.    . pantoprazole (PROTONIX) 40 MG tablet TAKE 1 TABLET BY MOUTH  DAILY 90 tablet 2  . traZODone (DESYREL) 50 MG tablet at bedtime.     . cyclobenzaprine (FLEXERIL) 10 MG tablet as needed. Reported on 07/15/2015    . Fe Fum-FA-B Cmp-C-Zn-Mg-Mn-Cu (HEMOCYTE PLUS) 106-1 MG CAPS Take 1 capsule by mouth daily. Reported on 07/15/2015  6  . oxymetazoline (AFRIN) 0.05 % nasal spray Place 2 sprays into left nostril as needed for congestion (For nosebleed). (Patient not taking: Reported on 08/08/2016) 15 mL 2  . venlafaxine XR (EFFEXOR-XR) 150 MG 24 hr capsule      No current facility-administered medications for this visit.    Facility-Administered Medications Ordered in Other Visits  Medication Dose Route Frequency Provider Last Rate Last Dose  . sodium chloride 0.9 % injection 10 mL  10 mL Intravenous PRN Lloyd Huger, MD   10 mL at 02/24/15 0951  . sodium chloride flush (NS) 0.9 % injection 10 mL  10 mL Intravenous PRN Evlyn Kanner, NP   10 mL at 01/27/16 1446    OBJECTIVE: Vitals:   08/08/16 1533  BP: 113/71  Pulse: (!) 59  Temp: 98.6 F (37 C)     There is no height or weight on file to calculate BMI.    ECOG FS:0 - Asymptomatic  General: Well-developed, well-nourished, no acute distress. Eyes: Pink conjunctiva, anicteric sclera. Breasts: Patient declined. Has breast exam with Dr  Donella Stade  Lungs: Clear to auscultation bilaterally. Heart: Regular rate and rhythm. No rubs, murmurs, or gallops. Abdomen: Soft, nontender, nondistended. No organomegaly noted, normoactive bowel sounds. Musculoskeletal: No edema, cyanosis, or clubbing. Neuro: Alert, answering all questions appropriately. Cranial nerves grossly intact. Skin: No rashes or petechiae noted. Psych: Normal affect.  LAB RESULTS:  Lab Results  Component Value Date   NA 135 (L) 03/20/2014   K 3.4 (L) 03/20/2014   CL 101 03/20/2014   CO2 28 03/20/2014   GLUCOSE 112 (H) 03/20/2014   BUN 8 03/20/2014   CREATININE 1.30 03/20/2014   CALCIUM 9.6 03/20/2014   PROT 6.9 03/20/2014   ALBUMIN 3.4 03/20/2014   AST 17 03/20/2014   ALT 23 03/20/2014   ALKPHOS 110 03/20/2014  BILITOT 0.4 03/20/2014   GFRNONAA 43 (L) 03/20/2014   GFRAA 50 (L) 03/20/2014    Lab Results  Component Value Date   WBC 6.5 04/24/2014   NEUTROABS 4.9 04/24/2014   HGB 10.2 (L) 04/24/2014   HCT 30.4 (L) 04/24/2014   MCV 93 04/24/2014   PLT 284 04/24/2014   Lab Results  Component Value Date   LABCA2 43.6 (H) 08/08/2016     STUDIES: No results found.  ASSESSMENT: Pathologic stage IIIa triple positive invasive carcinoma of the lower inner quadrant of the right breast.   PLAN:   1. Pathologic stage IIIa triple positive invasive carcinoma of the lower inner quadrant of the right breast: Patient has now completed all of her treatments including XRT her. She only completed 14 of 18 infusions of Herceptin secondary to persistent nausea. Her CA 27.29 was up to 40.1 in April 2017, pending for today. Continue letrozole completing 5 years of treatment in October 2020. Mammogram on July 05, 2015 was reported as BI-RADS 1, repeat mammogram due in December 2017, not yet completed, ordered today.  Return to clinic in 6 months for laboratory work and further evaluation. 2. Osteoporosis:  Bone mineral density on July 05, 2015 was reported as  -2.2, which is improved from 1 year prior when it was reported as -2.6. Patient was instructed to continue calcium and vitamin D supplementation. Repeat bone mineral density due in December 2017 not yet completed, ordered today. 3.  Anxiety: Continue Xanax as needed. Discussed relaxing breathing techniques; provided breath ratio chart handout outlining relaxing, balancing, and energizing breathing techniques. 4.  Headaches/tinnitus: Ttreatment per neurology.  5.  Insomia: Discussed 4-7-8 breathing technique at bedtime: breathe in to count of 4, hold breath for count of 7, exhale for count of 8; do 3-5 times for letting go of overactive thoughts  Patient expressed understanding and was in agreement with this plan. She also understands that She can call clinic at any time with any questions, concerns, or complaints.   Breast cancer of lower-inner quadrant of right female breast   Staging form: Breast, AJCC 7th Edition     Pathologic stage from 02/24/2015: Stage IIIA (T1c, N2b, cM0) - Signed by Lloyd Huger, MD on 02/24/2015   Lucendia Herrlich, NP  08/11/16 8:51 AM   Patient was seen and evaluated independently and I agree with the assessment and plan as dictated above. Patient's CA-27-29 continues to be mildly elevated, monitor.  Lloyd Huger, MD 08/11/16 8:52 AM

## 2016-08-08 ENCOUNTER — Inpatient Hospital Stay: Payer: Medicare Other | Attending: Oncology | Admitting: Oncology

## 2016-08-08 ENCOUNTER — Inpatient Hospital Stay: Payer: Medicare Other

## 2016-08-08 VITALS — BP 113/71 | HR 59 | Temp 98.6°F

## 2016-08-08 DIAGNOSIS — Z9011 Acquired absence of right breast and nipple: Secondary | ICD-10-CM | POA: Diagnosis not present

## 2016-08-08 DIAGNOSIS — M791 Myalgia: Secondary | ICD-10-CM | POA: Diagnosis not present

## 2016-08-08 DIAGNOSIS — R51 Headache: Secondary | ICD-10-CM | POA: Insufficient documentation

## 2016-08-08 DIAGNOSIS — Z79811 Long term (current) use of aromatase inhibitors: Secondary | ICD-10-CM | POA: Diagnosis not present

## 2016-08-08 DIAGNOSIS — Z9221 Personal history of antineoplastic chemotherapy: Secondary | ICD-10-CM

## 2016-08-08 DIAGNOSIS — H9319 Tinnitus, unspecified ear: Secondary | ICD-10-CM | POA: Diagnosis not present

## 2016-08-08 DIAGNOSIS — Z923 Personal history of irradiation: Secondary | ICD-10-CM | POA: Insufficient documentation

## 2016-08-08 DIAGNOSIS — G478 Other sleep disorders: Secondary | ICD-10-CM | POA: Diagnosis not present

## 2016-08-08 DIAGNOSIS — C50311 Malignant neoplasm of lower-inner quadrant of right female breast: Secondary | ICD-10-CM | POA: Insufficient documentation

## 2016-08-08 DIAGNOSIS — Z95828 Presence of other vascular implants and grafts: Secondary | ICD-10-CM

## 2016-08-08 DIAGNOSIS — K589 Irritable bowel syndrome without diarrhea: Secondary | ICD-10-CM | POA: Insufficient documentation

## 2016-08-08 DIAGNOSIS — F5104 Psychophysiologic insomnia: Secondary | ICD-10-CM | POA: Insufficient documentation

## 2016-08-08 DIAGNOSIS — Z170 Estrogen receptor positive status [ER+]: Secondary | ICD-10-CM | POA: Diagnosis not present

## 2016-08-08 DIAGNOSIS — Z79899 Other long term (current) drug therapy: Secondary | ICD-10-CM | POA: Insufficient documentation

## 2016-08-08 MED ORDER — SODIUM CHLORIDE 0.9% FLUSH
10.0000 mL | INTRAVENOUS | Status: DC | PRN
  Administered 2016-08-08: 10 mL via INTRAVENOUS
  Filled 2016-08-08: qty 10

## 2016-08-08 MED ORDER — HEPARIN SOD (PORK) LOCK FLUSH 100 UNIT/ML IV SOLN
500.0000 [IU] | Freq: Once | INTRAVENOUS | Status: AC
  Administered 2016-08-08: 500 [IU] via INTRAVENOUS

## 2016-08-08 NOTE — Progress Notes (Signed)
Patient here for followup, recently had the flu.  Still complaining of achy legs and joints.  She has not been "feeling good since the flu" pt reports that "she is easy to get a chill"

## 2016-08-09 LAB — CANCER ANTIGEN 27.29: CA 27.29: 43.6 U/mL — AB (ref 0.0–38.6)

## 2016-09-19 ENCOUNTER — Other Ambulatory Visit: Payer: Self-pay | Admitting: General Surgery

## 2016-09-19 ENCOUNTER — Ambulatory Visit
Admission: RE | Admit: 2016-09-19 | Discharge: 2016-09-19 | Disposition: A | Discharge status: Home / Self Care | Payer: Medicare Other | Source: Ambulatory Visit | Attending: Oncology | Admitting: Oncology

## 2016-09-19 DIAGNOSIS — C50311 Malignant neoplasm of lower-inner quadrant of right female breast: Secondary | ICD-10-CM

## 2016-09-19 DIAGNOSIS — Z13820 Encounter for screening for osteoporosis: Secondary | ICD-10-CM | POA: Diagnosis present

## 2016-09-19 DIAGNOSIS — Z1231 Encounter for screening mammogram for malignant neoplasm of breast: Secondary | ICD-10-CM | POA: Insufficient documentation

## 2016-09-19 DIAGNOSIS — Z780 Asymptomatic menopausal state: Secondary | ICD-10-CM | POA: Diagnosis not present

## 2016-09-19 DIAGNOSIS — Z170 Estrogen receptor positive status [ER+]: Principal | ICD-10-CM

## 2016-09-19 DIAGNOSIS — Z853 Personal history of malignant neoplasm of breast: Secondary | ICD-10-CM | POA: Diagnosis not present

## 2016-09-19 DIAGNOSIS — M8580 Other specified disorders of bone density and structure, unspecified site: Secondary | ICD-10-CM | POA: Diagnosis not present

## 2016-09-19 HISTORY — DX: Personal history of irradiation: Z92.3

## 2016-09-19 HISTORY — DX: Personal history of antineoplastic chemotherapy: Z92.21

## 2016-09-21 ENCOUNTER — Other Ambulatory Visit: Payer: Self-pay | Admitting: Oncology

## 2016-09-21 DIAGNOSIS — N6489 Other specified disorders of breast: Secondary | ICD-10-CM

## 2016-09-21 DIAGNOSIS — R928 Other abnormal and inconclusive findings on diagnostic imaging of breast: Secondary | ICD-10-CM

## 2016-10-05 ENCOUNTER — Other Ambulatory Visit: Payer: Medicare Other

## 2016-10-05 ENCOUNTER — Ambulatory Visit: Payer: Medicare Other

## 2016-10-10 ENCOUNTER — Ambulatory Visit
Admission: RE | Admit: 2016-10-10 | Discharge: 2016-10-10 | Disposition: A | Discharge status: Home / Self Care | Payer: Medicare Other | Source: Ambulatory Visit | Attending: Oncology | Admitting: Oncology

## 2016-10-10 DIAGNOSIS — R928 Other abnormal and inconclusive findings on diagnostic imaging of breast: Secondary | ICD-10-CM

## 2016-10-10 DIAGNOSIS — Z9011 Acquired absence of right breast and nipple: Secondary | ICD-10-CM | POA: Insufficient documentation

## 2016-10-10 DIAGNOSIS — Z853 Personal history of malignant neoplasm of breast: Secondary | ICD-10-CM | POA: Insufficient documentation

## 2016-10-10 DIAGNOSIS — N6489 Other specified disorders of breast: Secondary | ICD-10-CM

## 2016-11-01 ENCOUNTER — Other Ambulatory Visit: Payer: Self-pay

## 2016-11-02 ENCOUNTER — Encounter: Payer: Self-pay | Admitting: Gastroenterology

## 2016-11-02 ENCOUNTER — Other Ambulatory Visit: Payer: Self-pay

## 2016-11-02 ENCOUNTER — Ambulatory Visit (INDEPENDENT_AMBULATORY_CARE_PROVIDER_SITE_OTHER): Payer: Medicare Other | Admitting: Gastroenterology

## 2016-11-02 ENCOUNTER — Other Ambulatory Visit
Admission: RE | Admit: 2016-11-02 | Discharge: 2016-11-02 | Disposition: A | Discharge status: Home / Self Care | Payer: Medicare Other | Source: Ambulatory Visit | Attending: Gastroenterology | Admitting: Gastroenterology

## 2016-11-02 VITALS — BP 127/84 | HR 89 | Temp 98.5°F | Resp 18 | Ht 66.0 in | Wt 179.6 lb

## 2016-11-02 DIAGNOSIS — Z860100 Personal history of colon polyps, unspecified: Secondary | ICD-10-CM

## 2016-11-02 DIAGNOSIS — K602 Anal fissure, unspecified: Secondary | ICD-10-CM | POA: Insufficient documentation

## 2016-11-02 DIAGNOSIS — R110 Nausea: Secondary | ICD-10-CM

## 2016-11-02 DIAGNOSIS — K6289 Other specified diseases of anus and rectum: Secondary | ICD-10-CM | POA: Diagnosis not present

## 2016-11-02 DIAGNOSIS — I10 Essential (primary) hypertension: Secondary | ICD-10-CM | POA: Diagnosis present

## 2016-11-02 DIAGNOSIS — K649 Unspecified hemorrhoids: Secondary | ICD-10-CM | POA: Diagnosis not present

## 2016-11-02 LAB — COMPREHENSIVE METABOLIC PANEL
ALBUMIN: 4.4 g/dL (ref 3.5–5.0)
ALK PHOS: 117 U/L (ref 38–126)
ALT: 17 U/L (ref 14–54)
AST: 20 U/L (ref 15–41)
Anion gap: 11 (ref 5–15)
BUN: 13 mg/dL (ref 6–20)
CALCIUM: 10.3 mg/dL (ref 8.9–10.3)
CHLORIDE: 92 mmol/L — AB (ref 101–111)
CO2: 26 mmol/L (ref 22–32)
CREATININE: 1.39 mg/dL — AB (ref 0.44–1.00)
GFR calc Af Amer: 45 mL/min — ABNORMAL LOW (ref >60)
GFR calc non Af Amer: 39 mL/min — ABNORMAL LOW (ref >60)
GLUCOSE: 116 mg/dL — AB (ref 65–99)
Potassium: 4.5 mmol/L (ref 3.5–5.1)
SODIUM: 129 mmol/L — AB (ref 135–145)
Total Bilirubin: 0.7 mg/dL (ref 0.3–1.2)
Total Protein: 8.1 g/dL (ref 6.5–8.1)

## 2016-11-02 LAB — CBC WITH DIFFERENTIAL/PLATELET
BASOS PCT: 1 %
Basophils Absolute: 0 10*3/uL (ref 0–0.1)
EOS ABS: 0 10*3/uL (ref 0–0.7)
EOS PCT: 0 %
HCT: 36.8 % (ref 35.0–47.0)
HEMOGLOBIN: 12.5 g/dL (ref 12.0–16.0)
Lymphocytes Relative: 21 %
Lymphs Abs: 1.5 10*3/uL (ref 1.0–3.6)
MCH: 31.3 pg (ref 26.0–34.0)
MCHC: 33.9 g/dL (ref 32.0–36.0)
MCV: 92.5 fL (ref 80.0–100.0)
MONO ABS: 0.4 10*3/uL (ref 0.2–0.9)
MONOS PCT: 6 %
NEUTROS PCT: 72 %
Neutro Abs: 5.1 10*3/uL (ref 1.4–6.5)
Platelets: 399 10*3/uL (ref 150–440)
RBC: 3.98 MIL/uL (ref 3.80–5.20)
RDW: 13.1 % (ref 11.5–14.5)
WBC: 7.1 10*3/uL (ref 3.6–11.0)

## 2016-11-02 LAB — TSH: TSH: 2.859 u[IU]/mL (ref 0.350–4.500)

## 2016-11-02 MED ORDER — HYDROCORTISONE ACETATE 25 MG RE SUPP: 25.0000 mg | Freq: Every day | RECTAL | 0 refills | Status: AC

## 2016-11-02 MED ORDER — HYDROCORTISONE ACETATE 25 MG RE SUPP: 25.0000 mg | Freq: Every day | RECTAL | 0 refills | Status: DC

## 2016-11-02 NOTE — Progress Notes (Signed)
Gastroenterology Consultation  Referring Provider:     Perrin Maltese, MD Primary Care Physician:  Perrin Maltese, MD Primary Gastroenterologist:  Dr. [name]  Reason for Consultation:     Nausea         HPI:   Abigail Ortega is a 66 y.o. y/o female referred for consultation & management  by Dr. Perrin Maltese, MD.    She has been referred for nausea. She also says she has issues with hemorroids.   Review of the old records suggests that she had been evaluated for nausea even back in 2015 with Dr Zettie Pho . She has a family history of colon cancer and personal history of colon polyps. Last colonoscopy was back in 2012 and she was suggested a repeat in 3 years time. No recent labs or abdominal imaging .    She says she has had issues with nausea on and off for a few years, comes and goes , every other day , helped by the "nausea pill", eating helps. Never throws up , sometimes she has headaches, no heartburn , no bloated, she feels satiated easily . No NSAID use. Lost weight 20 lbs over last few months. She follows with Dr Grayland Ormond for breast cancer.   "fissure" - hurts  When she is sitting down or laying down, her face burns, when she applies pressure on her "behind " causes pain . Has a bowel movement twice a day , soft with a stool softner. When she has to "go" has burning. No blood in her stool.    BP 127/84   Pulse 89   Temp 98.5 F (36.9 C) (Oral)   Resp 18   Ht 5\' 6"  (1.676 m)   Wt 179 lb 9.6 oz (81.5 kg)   BMI 28.99 kg/m      Past Medical History:  Diagnosis Date  . Breast cancer [facility]) 2014   Right breast cancer - chemo, radiation and Mastectomy  . CHF (congestive heart failure) ([place]) 2013  . Clotting disorder ([place])    blood clots  . Heart attack 2012   coronary stent x2 completed by Dr. Clayborn Bigness.  Marland Kitchen Heart attack Apr 29, 2013  . Hemorrhoids   . IBS (irritable bowel syndrome)   . Malignant neoplasm of lower-inner quadrant of female breast [facility]) January 20, 2013.     invasive mammary cancer, minimal 0.85 cm.  Histologic grade 1.  . Personal history of chemotherapy 2014   BREAST CA  . Personal history of radiation therapy 2014   BREAST CA  . Thyroid disease     Past Surgical History:  Procedure Laterality Date  . BREAST SURGERY Right 02-21-2013   right mastectomy  . COLONOSCOPY W/ BIOPSIES  09/15/2010   Colonoscopy completed by Verdie Shire, M.D. Tubular adenoma of the ascending colon and descending colon reported up to 0.4 cm in diameter. No atypia.  Marland Kitchen MASTECTOMY Right 2014   BREAST CA  . UPPER GI ENDOSCOPY  09/15/2010     Completed by Verdie Shire, M.D. for nausea. Normal exam reported.  Marland Kitchen VASCULAR SURGERY      Prior to Admission medications   Medication Sig Start Date End Date Taking? Authorizing Provider  acetaminophen (TYLENOL 8 HOUR ARTHRITIS PAIN) 650 MG CR tablet Take 650 mg by mouth every 8 (eight) hours as needed for pain.    Historical Provider, MD  albuterol (PROAIR HFA) 108 (90 Base) MCG/ACT inhaler TAKE 2 PUFFS EVERY 4-6 HOURS AS NEEDED FOR COUGHING OR WHEEZING  OR SHORTNESS OF BREATH 08/31/16   Historical Provider, MD  ALPRAZolam Duanne Moron) 0.25 MG tablet Take 0.25 mg by mouth daily.  04/23/15   Historical Provider, MD  amLODipine (NORVASC) 10 MG tablet Take 10 mg by mouth daily.    Historical Provider, MD  aspirin EC 81 MG tablet Take by mouth.    Historical Provider, MD  atorvastatin (LIPITOR) 40 MG tablet Take 1 tablet by mouth once daily 06/08/15   Historical Provider, MD  Calcium 600-200 MG-UNIT tablet Take 1 tablet by mouth daily.    Historical Provider, MD  Cholecalciferol (VITAMIN D-1000 MAX ST) 1000 UNITS tablet Take by mouth.    Historical Provider, MD  clopidogrel (PLAVIX) 75 MG tablet Take by mouth daily.  10/26/14   Historical Provider, MD  cyclobenzaprine (FLEXERIL) 10 MG tablet as needed. Reported on 07/15/2015 11/16/14   Historical Provider, MD  Fe Fum-FA-B Cmp-C-Zn-Mg-Mn-Cu (HEMOCYTE PLUS) 106-1 MG CAPS Take 1 capsule by mouth daily.  Reported on 07/15/2015 01/14/15   Historical Provider, MD  Ferrous Sulfate (IRON) 325 (65 Fe) MG TABS Take by mouth.    Historical Provider, MD  ferrous sulfate 325 (65 FE) MG tablet 2 (two) times daily. 06/14/15   Historical Provider, MD  Fluticasone-Salmeterol 55-14 MCG/ACT AEPB Inhale into the lungs.    Historical Provider, MD  letrozole (FEMARA) 2.5 MG tablet TAKE 1 TABLET BY MOUTH  DAILY 08/06/16   Lloyd Huger, MD  levothyroxine (SYNTHROID, LEVOTHROID) 75 MCG tablet Take 75 mcg by mouth daily before breakfast.    Historical Provider, MD  Magnesium 250 MG TABS Take 250 mg by mouth daily.    Historical Provider, MD  montelukast (SINGULAIR) 10 MG tablet Take 10 mg by mouth at bedtime.    Historical Provider, MD  oxymetazoline (AFRIN) 0.05 % nasal spray Place 2 sprays into left nostril as needed for congestion (For nosebleed). Patient not taking: Reported on 08/08/2016 04/11/16 04/11/17  Pierce Crane Beers, PA-C  pantoprazole (PROTONIX) 40 MG tablet TAKE 1 TABLET BY MOUTH  DAILY 08/06/16   Lloyd Huger, MD  traZODone (DESYREL) 50 MG tablet at bedtime.  10/05/14   Historical Provider, MD  venlafaxine XR (EFFEXOR-XR) 150 MG 24 hr capsule  11/23/14   Historical Provider, MD    Family History  Problem Relation Age of Onset  . Cancer Mother 69    breast  . Breast cancer Mother 76  . Cancer Father     colon     Social History  Substance Use Topics  . Smoking status: Former Smoker    Years: 20.00  . Smokeless tobacco: Never Used  . Alcohol use No    Allergies as of 11/02/2016  . (No Known Allergies)    Review of Systems:    All systems reviewed and negative except where noted in HPI.   Physical Exam:  There were no vitals taken for this visit. No LMP recorded. Patient is postmenopausal. Psych:  Alert and cooperative. Normal mood and affect. General:   Alert,  Well-developed, well-nourished, pleasant and cooperative in NAD Head:  Normocephalic and atraumatic. Eyes:  Sclera  clear, no icterus.   Conjunctiva pink. Ears:  Normal auditory acuity. Nose:  No deformity, discharge, or lesions. Mouth:  No deformity or lesions,oropharynx pink & moist. Neck:  Supple; no masses or thyromegaly. Lungs:  Respirations even and unlabored.  Clear throughout to auscultation.   No wheezes, crackles, or rhonchi. No acute distress. Heart:  Regular rate and rhythm; no murmurs, clicks, rubs, or  gallops. Abdomen:  Normal bowel sounds.  No bruits.  Soft, non-tender and non-distended without masses, hepatosplenomegaly or hernias noted.  No guarding or rebound tenderness.    Msk:  Symmetrical without gross deformities. Good, equal movement & strength bilaterally. Neurologic:  Alert and oriented x3;  grossly normal neurologically. Psych:  Alert and cooperative. Normal mood and affect.  Imaging Studies: Mm Diag Breast Tomo Uni Left  Result Date: 10/10/2016 CLINICAL DATA:  Screening recall for a possible asymmetry in the left breast. Patient has a history of the right mastectomy for breast carcinoma in 2014. EXAM: 2D DIGITAL DIAGNOSTIC UNILATERAL LEFT MAMMOGRAM WITH CAD AND ADJUNCT TOMO COMPARISON:  Previous exam(s). ACR Breast Density Category b: There are scattered areas of fibroglandular density. FINDINGS: The small opacity noted in the posterior aspect the left breast on the MLO view is not reproduced on the diagnostic 2D or 3D images, consistent with the opacity being superimposed fibroglandular tissue on the screening study. There are no discrete masses or areas of significant asymmetry. There are no areas of architectural distortion. No suspicious calcifications. Mammographic images were processed with CAD. IMPRESSION: No evidence of malignancy. RECOMMENDATION: Screening mammogram in one year.(Code:SM-B-01Y) I have discussed the findings and recommendations with the patient. Results were also provided in writing at the conclusion of the visit. If applicable, a reminder letter will be sent to  the patient regarding the next appointment. BI-RADS CATEGORY  1: Negative. Electronically Signed   By: Lajean Manes M.D.   On: 10/10/2016 15:55    Assessment and Plan:   DOLLYE GLASSER is a 66 y.o. y/o female has been referred for nausea. Wide differential for nausea which seems to have been long standing . I note she is on oral iron which can cause the same. May be worth while trying to take her off for a few weeks to see if makes her feel better . She also say she had anal pain for a few months. She is due for a surveillance colonoscopy. She also mentions unintentional weight loss of 20 lbs .   Plan  1.Trial off oral iron  2. Ct scan of the abdomen for nausea and weight loss 3. Labs CBC,BMP,LFT,TSH 4. Anusol for fissure for 7 days  5. High fiber diet  6. EGD+colonoscopy  7.Plavix holding instructions  8.Continue PPI  Follow up in 12 weeks   Dr [name] MD

## 2016-11-08 ENCOUNTER — Telehealth: Payer: Self-pay | Admitting: Gastroenterology

## 2016-11-08 ENCOUNTER — Other Ambulatory Visit: Payer: Self-pay

## 2016-11-08 ENCOUNTER — Telehealth: Payer: Self-pay

## 2016-11-08 DIAGNOSIS — R110 Nausea: Secondary | ICD-10-CM

## 2016-11-08 NOTE — Telephone Encounter (Signed)
Per pt's [facility] Medicare plan, authorization for CT Scan [phone] is not required.   Spoke with Etheleen Mayhew. Case # [id number]

## 2016-11-08 NOTE — Telephone Encounter (Signed)
Patient called and stated she did her stool sample wrong and was wondering if she really needs to do it. She has a couple of questions to ask you.

## 2016-11-09 ENCOUNTER — Other Ambulatory Visit
Admission: RE | Admit: 2016-11-09 | Discharge: 2016-11-09 | Disposition: A | Discharge status: Home / Self Care | Payer: Medicare Other | Source: Ambulatory Visit | Attending: Gastroenterology | Admitting: Gastroenterology

## 2016-11-09 DIAGNOSIS — R110 Nausea: Secondary | ICD-10-CM | POA: Insufficient documentation

## 2016-11-10 NOTE — Telephone Encounter (Signed)
-----   Message from [name], MD sent at 11/09/2016  2:44 PM EDT ----- Inform labs suggest   1. She is a bit dehydrated- encourage oral fluid intake  2. Repeat BMP  3. Enquire if has any diarrhea or vomiting

## 2016-11-10 NOTE — Telephone Encounter (Signed)
LVM for patient callback to advise results per Dr. Vicente Males.   1. Ask patient if vomiting or diarrhea

## 2016-11-11 LAB — H. PYLORI ANTIGEN, STOOL: H. PYLORI STOOL AG, EIA: NEGATIVE

## 2016-11-16 ENCOUNTER — Ambulatory Visit
Admission: RE | Admit: 2016-11-16 | Discharge: 2016-11-16 | Disposition: A | Discharge status: Home / Self Care | Payer: Medicare Other | Source: Ambulatory Visit | Attending: Gastroenterology | Admitting: Gastroenterology

## 2016-11-16 ENCOUNTER — Other Ambulatory Visit: Payer: Self-pay

## 2016-11-16 ENCOUNTER — Other Ambulatory Visit: Payer: Self-pay | Admitting: Gastroenterology

## 2016-11-16 DIAGNOSIS — K5730 Diverticulosis of large intestine without perforation or abscess without bleeding: Secondary | ICD-10-CM | POA: Diagnosis not present

## 2016-11-16 DIAGNOSIS — M4185 Other forms of scoliosis, thoracolumbar region: Secondary | ICD-10-CM | POA: Insufficient documentation

## 2016-11-16 DIAGNOSIS — R110 Nausea: Secondary | ICD-10-CM | POA: Diagnosis not present

## 2016-11-16 DIAGNOSIS — Z87442 Personal history of urinary calculi: Secondary | ICD-10-CM | POA: Insufficient documentation

## 2016-11-16 DIAGNOSIS — Z853 Personal history of malignant neoplasm of breast: Secondary | ICD-10-CM | POA: Diagnosis not present

## 2016-11-16 DIAGNOSIS — K449 Diaphragmatic hernia without obstruction or gangrene: Secondary | ICD-10-CM | POA: Diagnosis not present

## 2016-11-16 DIAGNOSIS — K429 Umbilical hernia without obstruction or gangrene: Secondary | ICD-10-CM | POA: Diagnosis not present

## 2016-11-16 DIAGNOSIS — K5900 Constipation, unspecified: Secondary | ICD-10-CM

## 2016-11-16 HISTORY — DX: Essential (primary) hypertension: I10

## 2016-11-16 MED ORDER — HYDROCORTISONE ACETATE 25 MG RE SUPP: 25.0000 mg | Freq: Two times a day (BID) | RECTAL | 1 refills | Status: DC

## 2016-11-16 MED ORDER — IOPAMIDOL (ISOVUE-300) INJECTION 61%
100.0000 mL | Freq: Once | INTRAVENOUS | Status: AC | PRN
  Administered 2016-11-16: 100 mL via INTRAVENOUS

## 2016-11-20 ENCOUNTER — Other Ambulatory Visit: Payer: Self-pay

## 2016-11-20 DIAGNOSIS — Z86010 Personal history of colonic polyps: Secondary | ICD-10-CM

## 2016-11-20 DIAGNOSIS — R110 Nausea: Secondary | ICD-10-CM

## 2016-11-21 ENCOUNTER — Telehealth: Payer: Self-pay | Admitting: Gastroenterology

## 2016-11-21 NOTE — Telephone Encounter (Signed)
11/21/16 Spoke with Lia Hopping at [facility] and NO prior auth is required for EGD [place] [facility] [place], R11.0 / K59.00.

## 2016-12-12 ENCOUNTER — Encounter: Payer: Self-pay | Admitting: Anesthesiology

## 2016-12-12 ENCOUNTER — Encounter
Admission: EM | Disposition: A | Discharge status: Home / Self Care | Payer: Self-pay | Source: Home / Self Care | Attending: Internal Medicine

## 2016-12-12 ENCOUNTER — Other Ambulatory Visit: Payer: Self-pay

## 2016-12-12 ENCOUNTER — Encounter: Payer: Self-pay | Admitting: Emergency Medicine

## 2016-12-12 ENCOUNTER — Inpatient Hospital Stay
Admission: EM | Admit: 2016-12-12 | Discharge: 2016-12-13 | DRG: 369 | Disposition: A | Discharge status: Home / Self Care | Payer: Medicare Other | Attending: Internal Medicine | Admitting: Internal Medicine

## 2016-12-12 ENCOUNTER — Ambulatory Visit: Admission: RE | Admit: 2016-12-12 | Payer: Medicare Other | Source: Ambulatory Visit | Admitting: Gastroenterology

## 2016-12-12 DIAGNOSIS — Z9011 Acquired absence of right breast and nipple: Secondary | ICD-10-CM

## 2016-12-12 DIAGNOSIS — K921 Melena: Secondary | ICD-10-CM | POA: Diagnosis not present

## 2016-12-12 DIAGNOSIS — Z9221 Personal history of antineoplastic chemotherapy: Secondary | ICD-10-CM

## 2016-12-12 DIAGNOSIS — Z923 Personal history of irradiation: Secondary | ICD-10-CM

## 2016-12-12 DIAGNOSIS — I110 Hypertensive heart disease with heart failure: Secondary | ICD-10-CM | POA: Diagnosis present

## 2016-12-12 DIAGNOSIS — I252 Old myocardial infarction: Secondary | ICD-10-CM

## 2016-12-12 DIAGNOSIS — E785 Hyperlipidemia, unspecified: Secondary | ICD-10-CM | POA: Diagnosis not present

## 2016-12-12 DIAGNOSIS — Z955 Presence of coronary angioplasty implant and graft: Secondary | ICD-10-CM

## 2016-12-12 DIAGNOSIS — E869 Volume depletion, unspecified: Secondary | ICD-10-CM | POA: Diagnosis present

## 2016-12-12 DIAGNOSIS — K922 Gastrointestinal hemorrhage, unspecified: Secondary | ICD-10-CM | POA: Diagnosis present

## 2016-12-12 DIAGNOSIS — Z87891 Personal history of nicotine dependence: Secondary | ICD-10-CM | POA: Diagnosis not present

## 2016-12-12 DIAGNOSIS — E878 Other disorders of electrolyte and fluid balance, not elsewhere classified: Secondary | ICD-10-CM | POA: Diagnosis present

## 2016-12-12 DIAGNOSIS — E039 Hypothyroidism, unspecified: Secondary | ICD-10-CM | POA: Diagnosis present

## 2016-12-12 DIAGNOSIS — C50311 Malignant neoplasm of lower-inner quadrant of right female breast: Secondary | ICD-10-CM | POA: Diagnosis not present

## 2016-12-12 DIAGNOSIS — Z800 Family history of malignant neoplasm of digestive organs: Secondary | ICD-10-CM

## 2016-12-12 DIAGNOSIS — E669 Obesity, unspecified: Secondary | ICD-10-CM | POA: Diagnosis present

## 2016-12-12 DIAGNOSIS — Z79899 Other long term (current) drug therapy: Secondary | ICD-10-CM

## 2016-12-12 DIAGNOSIS — I5022 Chronic systolic (congestive) heart failure: Secondary | ICD-10-CM | POA: Diagnosis not present

## 2016-12-12 DIAGNOSIS — Z7902 Long term (current) use of antithrombotics/antiplatelets: Secondary | ICD-10-CM

## 2016-12-12 DIAGNOSIS — K449 Diaphragmatic hernia without obstruction or gangrene: Secondary | ICD-10-CM | POA: Diagnosis present

## 2016-12-12 DIAGNOSIS — Z86010 Personal history of colonic polyps: Secondary | ICD-10-CM

## 2016-12-12 DIAGNOSIS — E876 Hypokalemia: Secondary | ICD-10-CM | POA: Diagnosis present

## 2016-12-12 DIAGNOSIS — Z803 Family history of malignant neoplasm of breast: Secondary | ICD-10-CM

## 2016-12-12 DIAGNOSIS — R112 Nausea with vomiting, unspecified: Secondary | ICD-10-CM | POA: Diagnosis present

## 2016-12-12 DIAGNOSIS — E860 Dehydration: Secondary | ICD-10-CM | POA: Diagnosis present

## 2016-12-12 DIAGNOSIS — K580 Irritable bowel syndrome with diarrhea: Secondary | ICD-10-CM | POA: Diagnosis not present

## 2016-12-12 DIAGNOSIS — K920 Hematemesis: Secondary | ICD-10-CM | POA: Diagnosis not present

## 2016-12-12 DIAGNOSIS — Z7982 Long term (current) use of aspirin: Secondary | ICD-10-CM

## 2016-12-12 DIAGNOSIS — Z6830 Body mass index (BMI) 30.0-30.9, adult: Secondary | ICD-10-CM | POA: Diagnosis not present

## 2016-12-12 DIAGNOSIS — E871 Hypo-osmolality and hyponatremia: Secondary | ICD-10-CM | POA: Diagnosis not present

## 2016-12-12 DIAGNOSIS — K226 Gastro-esophageal laceration-hemorrhage syndrome: Secondary | ICD-10-CM | POA: Diagnosis not present

## 2016-12-12 DIAGNOSIS — K2901 Acute gastritis with bleeding: Secondary | ICD-10-CM

## 2016-12-12 LAB — TYPE AND SCREEN
ABO/RH(D): O POS
Antibody Screen: NEGATIVE

## 2016-12-12 LAB — BASIC METABOLIC PANEL
Anion gap: 9 (ref 5–15)
Anion gap: 11 (ref 5–15)
Anion gap: 10 (ref 5–15)
BUN: 8 mg/dL (ref 6–20)
BUN: 7 mg/dL (ref 6–20)
BUN: 8 mg/dL (ref 6–20)
CALCIUM: 9.3 mg/dL (ref 8.9–10.3)
CALCIUM: 9.3 mg/dL (ref 8.9–10.3)
CALCIUM: 9.3 mg/dL (ref 8.9–10.3)
CO2: 26 mmol/L (ref 22–32)
CO2: 25 mmol/L (ref 22–32)
CO2: 24 mmol/L (ref 22–32)
CREATININE: 0.94 mg/dL (ref 0.44–1.00)
Chloride: 87 mmol/L — ABNORMAL LOW (ref 101–111)
Chloride: 88 mmol/L — ABNORMAL LOW (ref 101–111)
Chloride: 90 mmol/L — ABNORMAL LOW (ref 101–111)
Creatinine, Ser: 0.94 mg/dL (ref 0.44–1.00)
Creatinine, Ser: 0.92 mg/dL (ref 0.44–1.00)
GFR calc Af Amer: >60 mL/min (ref >60)
GFR calc non Af Amer: >60 mL/min (ref >60)
GFR calc non Af Amer: >60 mL/min (ref >60)
GFR calc non Af Amer: >60 mL/min (ref >60)
GLUCOSE: 138 mg/dL — AB (ref 65–99)
GLUCOSE: 121 mg/dL — AB (ref 65–99)
GLUCOSE: 108 mg/dL — AB (ref 65–99)
Potassium: 3.9 mmol/L (ref 3.5–5.1)
Potassium: 3.9 mmol/L (ref 3.5–5.1)
Potassium: 4.1 mmol/L (ref 3.5–5.1)
Sodium: 122 mmol/L — ABNORMAL LOW (ref 135–145)
Sodium: 124 mmol/L — ABNORMAL LOW (ref 135–145)
Sodium: 124 mmol/L — ABNORMAL LOW (ref 135–145)

## 2016-12-12 LAB — CBC
HCT: 33.4 % — ABNORMAL LOW (ref 35.0–47.0)
Hemoglobin: 11.9 g/dL — ABNORMAL LOW (ref 12.0–16.0)
MCH: 32.2 pg (ref 26.0–34.0)
MCHC: 35.5 g/dL (ref 32.0–36.0)
MCV: 90.7 fL (ref 80.0–100.0)
PLATELETS: 353 10*3/uL (ref 150–440)
RBC: 3.69 MIL/uL — ABNORMAL LOW (ref 3.80–5.20)
RDW: 12.7 % (ref 11.5–14.5)
WBC: 10.4 10*3/uL (ref 3.6–11.0)

## 2016-12-12 LAB — LIPASE, BLOOD: LIPASE: 33 U/L (ref 11–51)

## 2016-12-12 LAB — COMPREHENSIVE METABOLIC PANEL
ALK PHOS: 113 U/L (ref 38–126)
ALT: 19 U/L (ref 14–54)
ANION GAP: 13 (ref 5–15)
AST: 32 U/L (ref 15–41)
Albumin: 4.4 g/dL (ref 3.5–5.0)
BUN: 11 mg/dL (ref 6–20)
CALCIUM: 9.5 mg/dL (ref 8.9–10.3)
CHLORIDE: 83 mmol/L — AB (ref 101–111)
CO2: 26 mmol/L (ref 22–32)
Creatinine, Ser: 0.99 mg/dL (ref 0.44–1.00)
GFR, EST NON AFRICAN AMERICAN: 58 mL/min — AB (ref >60)
Glucose, Bld: 158 mg/dL — ABNORMAL HIGH (ref 65–99)
Potassium: 2.9 mmol/L — ABNORMAL LOW (ref 3.5–5.1)
SODIUM: 122 mmol/L — AB (ref 135–145)
Total Bilirubin: 0.8 mg/dL (ref 0.3–1.2)
Total Protein: 7.6 g/dL (ref 6.5–8.1)

## 2016-12-12 LAB — GLUCOSE, CAPILLARY: GLUCOSE-CAPILLARY: 106 mg/dL — AB (ref 65–99)

## 2016-12-12 LAB — TROPONIN I

## 2016-12-12 LAB — PHOSPHORUS: PHOSPHORUS: 2.8 mg/dL (ref 2.5–4.6)

## 2016-12-12 LAB — MAGNESIUM: MAGNESIUM: 1.1 mg/dL — AB (ref 1.7–2.4)

## 2016-12-12 SURGERY — ESOPHAGOGASTRODUODENOSCOPY (EGD) WITH PROPOFOL
Anesthesia: General

## 2016-12-12 MED ORDER — MAGNESIUM SULFATE 4 GM/100ML IV SOLN
4.0000 g | Freq: Once | INTRAVENOUS | Status: AC
  Administered 2016-12-12: 4 g via INTRAVENOUS
  Filled 2016-12-12: qty 100

## 2016-12-12 MED ORDER — LORAZEPAM 2 MG/ML IJ SOLN
0.5000 mg | Freq: Once | INTRAMUSCULAR | Status: AC
  Administered 2016-12-12: 0.5 mg via INTRAVENOUS

## 2016-12-12 MED ORDER — METOCLOPRAMIDE HCL 5 MG/ML IJ SOLN
10.0000 mg | Freq: Once | INTRAMUSCULAR | Status: AC
  Administered 2016-12-12: 10 mg via INTRAVENOUS

## 2016-12-12 MED ORDER — LORAZEPAM 2 MG/ML IJ SOLN
INTRAMUSCULAR | Status: AC
  Administered 2016-12-12: 0.5 mg via INTRAVENOUS
  Filled 2016-12-12: qty 1

## 2016-12-12 MED ORDER — HYDROCORTISONE ACETATE 25 MG RE SUPP
25.0000 mg | Freq: Every day | RECTAL | Status: DC
  Administered 2016-12-12 – 2016-12-13 (×2): 25 mg via RECTAL
  Filled 2016-12-12 (×3): qty 1

## 2016-12-12 MED ORDER — SODIUM CHLORIDE 0.9 % IV SOLN
80.0000 mg | Freq: Once | INTRAVENOUS | Status: AC
  Administered 2016-12-12: 08:00:00 80 mg via INTRAVENOUS
  Filled 2016-12-12: qty 80

## 2016-12-12 MED ORDER — POTASSIUM CHLORIDE IN NACL 20-0.9 MEQ/L-% IV SOLN
INTRAVENOUS | Status: DC
  Administered 2016-12-12: 1000 mL via INTRAVENOUS
  Filled 2016-12-12 (×3): qty 1000

## 2016-12-12 MED ORDER — MORPHINE SULFATE (PF) 2 MG/ML IV SOLN
1.0000 mg | INTRAVENOUS | Status: DC | PRN
  Administered 2016-12-12 (×2): 1 mg via INTRAVENOUS
  Filled 2016-12-12 (×2): qty 1

## 2016-12-12 MED ORDER — ACETAMINOPHEN 325 MG PO TABS: 650.0000 mg | ORAL_TABLET | Freq: Four times a day (QID) | ORAL | Status: DC | PRN

## 2016-12-12 MED ORDER — METOCLOPRAMIDE HCL 5 MG/ML IJ SOLN
INTRAMUSCULAR | Status: AC
  Administered 2016-12-12: 10 mg via INTRAVENOUS
  Filled 2016-12-12: qty 2

## 2016-12-12 MED ORDER — PANTOPRAZOLE SODIUM 40 MG IV SOLR
8.0000 mg/h | INTRAVENOUS | Status: DC
  Administered 2016-12-12 – 2016-12-13 (×3): 8 mg/h via INTRAVENOUS
  Filled 2016-12-12 (×3): qty 80

## 2016-12-12 MED ORDER — ONDANSETRON HCL 4 MG/2ML IJ SOLN
4.0000 mg | Freq: Once | INTRAMUSCULAR | Status: AC
  Administered 2016-12-12: 4 mg via INTRAVENOUS

## 2016-12-12 MED ORDER — TEMAZEPAM 15 MG PO CAPS
30.0000 mg | ORAL_CAPSULE | Freq: Every evening | ORAL | Status: DC | PRN
  Administered 2016-12-12: 30 mg via ORAL
  Filled 2016-12-12: qty 2

## 2016-12-12 MED ORDER — PANTOPRAZOLE SODIUM 40 MG IV SOLR: 40.0000 mg | Freq: Two times a day (BID) | INTRAVENOUS | Status: DC

## 2016-12-12 MED ORDER — ONDANSETRON HCL 4 MG/2ML IJ SOLN
4.0000 mg | Freq: Four times a day (QID) | INTRAMUSCULAR | Status: DC | PRN
  Administered 2016-12-12 (×2): 4 mg via INTRAVENOUS
  Filled 2016-12-12 (×2): qty 2

## 2016-12-12 MED ORDER — POTASSIUM CHLORIDE 10 MEQ/100ML IV SOLN
10.0000 meq | Freq: Once | INTRAVENOUS | Status: AC
  Administered 2016-12-12: 10 meq via INTRAVENOUS
  Filled 2016-12-12: qty 100

## 2016-12-12 MED ORDER — HYDROCODONE-ACETAMINOPHEN 5-325 MG PO TABS
1.0000 | ORAL_TABLET | ORAL | Status: DC | PRN
  Filled 2016-12-12: qty 1

## 2016-12-12 MED ORDER — SODIUM CHLORIDE 0.9 % IV SOLN
INTRAVENOUS | Status: DC
  Administered 2016-12-12: 1000 mL via INTRAVENOUS
  Administered 2016-12-13 (×2): via INTRAVENOUS

## 2016-12-12 MED ORDER — ONDANSETRON HCL 4 MG/2ML IJ SOLN
INTRAMUSCULAR | Status: AC
  Administered 2016-12-12: 4 mg via INTRAVENOUS
  Filled 2016-12-12: qty 2

## 2016-12-12 MED ORDER — ACETAMINOPHEN 650 MG RE SUPP: 650.0000 mg | Freq: Four times a day (QID) | RECTAL | Status: DC | PRN

## 2016-12-12 MED ORDER — POTASSIUM CHLORIDE 10 MEQ/100ML IV SOLN
10.0000 meq | INTRAVENOUS | Status: AC
  Administered 2016-12-12 (×4): 10 meq via INTRAVENOUS
  Filled 2016-12-12 (×4): qty 100

## 2016-12-12 MED ORDER — ONDANSETRON HCL 4 MG PO TABS: 4.0000 mg | ORAL_TABLET | Freq: Four times a day (QID) | ORAL | Status: DC | PRN

## 2016-12-12 MED ORDER — SODIUM CHLORIDE 0.9 % IV BOLUS (SEPSIS)
1000.0000 mL | Freq: Once | INTRAVENOUS | Status: AC
  Administered 2016-12-12: 1000 mL via INTRAVENOUS

## 2016-12-12 NOTE — ED Provider Notes (Signed)
[facility] Emergency Department Provider Note   ____________________________________________   First MD Initiated Contact with Patient 12/12/16 [phone]     (approximate)  I have reviewed the triage vital signs and the nursing notes.   HISTORY  Chief Complaint Hematemesis and Hematochezia    HPI Abigail Ortega is a 66 y.o. female who comes into the hospital today with nausea vomiting and diarrhea. The patient reports that she is scheduled for a colonoscopy and an endoscopy this morning at 8 AM. The patient reports that she started her prep and 5 PM and then started vomiting around 6 or 7 PM her emesis has been brown and coffee ground in appearance. She is also had some brown and black looking stools. According to EMS the patient also did have some chest pain initially and she reports that she had some abdominal pain but that has improved. She has been just doing her liquid diet so she has just been vomiting up liquid. The patient reports that she is just very nauseous she is here today for evaluation.The patient denies any pain at this time. She was given Zofran by EMS but she still vomiting.   Past Medical History:  Diagnosis Date  . Breast cancer [facility]) 2014   Right breast cancer - chemo, radiation and Mastectomy  . CHF (congestive heart failure) ([place]) 2013  . Clotting disorder ([place])    blood clots  . Heart attack [facility]) 2012   coronary stent x2 completed by Dr. Clayborn Bigness.  Marland Kitchen Heart attack ([place]) Apr 29, 2013  . Hemorrhoids   . Hypertension   . IBS (irritable bowel syndrome)   . Malignant neoplasm of lower-inner quadrant of female breast [facility]) January 20, 2013.   invasive mammary cancer, minimal 0.85 cm.  Histologic grade 1.  . Personal history of chemotherapy 2014   BREAST CA  . Personal history of radiation therapy 2014   BREAST CA  . Thyroid disease     Patient Active Problem List   Diagnosis Date Noted  . GIB (gastrointestinal bleeding)  12/12/2016  . Obesity (BMI 35.0-39.9 without comorbidity) 08/20/2015  . Anal pain 01/13/2015  . Anxiety 12/16/2013  . Breast cancer ([place]) 12/16/2013  . CAD (coronary artery disease) 12/16/2013  . Hyperlipidemia, unspecified 12/16/2013  . Hypertension 12/16/2013  . Hypothyroid 12/16/2013  . Nausea 12/16/2013  . Acquired absence of breast 03/04/2013  . Congestive heart failure ([place]) 03/04/2013  . Breast cancer of lower-inner quadrant of right female breast ([place]) 01/28/2013    Past Surgical History:  Procedure Laterality Date  . BREAST SURGERY Right 02-21-2013   right mastectomy  . COLONOSCOPY W/ BIOPSIES  09/15/2010   Colonoscopy completed by Verdie Shire, M.D. Tubular adenoma of the ascending colon and descending colon reported up to 0.4 cm in diameter. No atypia.  Marland Kitchen MASTECTOMY Right 2014   BREAST CA  . UPPER GI ENDOSCOPY  09/15/2010     Completed by Verdie Shire, M.D. for nausea. Normal exam reported.  Marland Kitchen VASCULAR SURGERY      Prior to Admission medications   Medication Sig Start Date End Date Taking? Authorizing Provider  acetaminophen (TYLENOL 8 HOUR ARTHRITIS PAIN) 650 MG CR tablet Take 650 mg by mouth every 8 (eight) hours as needed for pain.    [provider]  ALPRAZolam Duanne Moron) 0.25 MG tablet Take 0.25 mg by mouth daily.  04/23/15   [provider]  amLODipine (NORVASC) 10 MG tablet Take 10 mg by mouth daily.  [provider]  amoxicillin-clavulanate (AUGMENTIN) 875-125 MG tablet  08/21/16   [provider]  aspirin EC 81 MG tablet Take by mouth.    [provider]  atorvastatin (LIPITOR) 40 MG tablet Take 1 tablet by mouth once daily 06/08/15   [provider]  Calcium 600-200 MG-UNIT tablet Take 1 tablet by mouth daily.    [provider]  Cholecalciferol (VITAMIN D-1000 MAX ST) 1000 UNITS tablet Take by mouth.    [provider]  clopidogrel (PLAVIX) 75 MG tablet Take by mouth daily.  10/26/14   [provider]  clotrimazole (LOTRIMIN) 1 % cream  09/25/16   [provider]  cyclobenzaprine (FLEXERIL) 10 MG tablet as needed. Reported on 07/15/2015 11/16/14   [provider]  doxycycline (MONODOX) 100 MG capsule TAKE ONE CAPSULE BY MOUTH TWICE A DAY FOR 14 DAYS 08/01/16   [provider]  Fe Fum-FA-B Cmp-C-Zn-Mg-Mn-Cu (HEMOCYTE PLUS) 106-1 MG CAPS Take 1 capsule by mouth daily. Reported on 07/15/2015 01/14/15   [provider]  Ferrous Sulfate (IRON) 325 (65 Fe) MG TABS Take by mouth.    [provider]  fluconazole (DIFLUCAN) 150 MG tablet  10/09/16   [provider]  fluticasone Asencion Islam) 50 MCG/ACT nasal spray  09/11/16   [provider]  Fluticasone-Salmeterol 55-14 MCG/ACT AEPB Inhale into the lungs.    [provider]  FLUZONE HIGH-DOSE 0.5 ML SUSY TO BE ADMINISTERED BY PHARMACIST FOR IMMUNIZATION 07/31/16   [provider]  hydrocortisone (ANUSOL-HC) 25 MG suppository Place 1 suppository (25 mg total) rectally 2 (two) times daily. 11/16/16   [name], MD  letrozole [facility]) 2.5 MG tablet TAKE 1 TABLET BY MOUTH  DAILY 08/06/16   Lloyd Huger, MD  levothyroxine (SYNTHROID, LEVOTHROID) 75 MCG tablet Take 75 mcg by mouth daily before breakfast.    [provider]  Magnesium 250 MG TABS Take 250 mg by mouth daily.    [provider]  methylPREDNISolone (MEDROL DOSEPAK) 4 MG TBPK tablet TAKE 6 TABLETS ON DAY 1 AS DIRECTED ON PACKAGE AND DECREASE BY 1 TAB EACH DAY FOR A TOTAL OF 6 DAYS 08/01/16   [provider]  montelukast (SINGULAIR) 10 MG tablet Take 10 mg by mouth at bedtime.    [provider]  ondansetron (ZOFRAN) 4 MG tablet  10/09/16   [provider]  oxymetazoline (AFRIN) 0.05 % nasal spray Place 2 sprays into left nostril as needed for congestion (For nosebleed). 04/11/16 04/11/17  Beers, Pierce Crane, PA-C  pantoprazole (PROTONIX) 40 MG tablet TAKE 1 TABLET BY  MOUTH  DAILY 08/06/16   Lloyd Huger, MD  PROCTOSOL [facility] 2.5 % rectal cream  09/08/16   [provider]  temazepam (RESTORIL) 15 MG capsule  08/07/16   [provider]  temazepam (RESTORIL) 30 MG capsule  10/25/16   [provider]  traZODone (DESYREL) 50 MG tablet at bedtime.  10/05/14   [provider]  venlafaxine XR (EFFEXOR-XR) 150 MG 24 hr capsule  11/23/14   [provider]  venlafaxine XR (EFFEXOR-XR) 75 MG 24 hr capsule  10/12/16   [provider]    Allergies Patient has no known allergies.  Family History  Problem Relation Age of Onset  . Cancer Mother 73       breast  . Breast cancer Mother 68  . Cancer Father        colon    Social History Social History  Substance  Use Topics  . Smoking status: Former Smoker    Years: 20.00  . Smokeless tobacco: Never Used  . Alcohol use No    Review of Systems  Constitutional: No fever/chills Eyes: No visual changes. ENT: No sore throat. Cardiovascular:  chest pain. Respiratory: Denies shortness of breath. Gastrointestinal: Nausea vomiting and diarrhea with some abdominal pain. No constipation. Genitourinary: Negative for dysuria. Musculoskeletal: Negative for back pain. Skin: Negative for rash. Neurological: Negative for headaches, focal weakness or numbness.   ____________________________________________   PHYSICAL EXAM:  VITAL SIGNS: ED Triage Vitals  Enc Vitals Group     BP --      Pulse Rate 12/12/16 0625 84     Resp 12/12/16 0625 16     Temp 12/12/16 0625 98.2 F (36.8 C)     Temp Source 12/12/16 0625 Oral     SpO2 --      Weight 12/12/16 0627 175 lb (79.4 kg)     Height 12/12/16 0627 5\' 4"  (1.626 m)     Head Circumference --      Peak Flow --      Pain Score --      Pain Loc --      Pain Edu? --      Excl. in [place]? --     Constitutional: Alert and oriented. Well appearing and in moderate distress. Eyes: Conjunctivae are normal. PERRL.  EOMI. Head: Atraumatic. Nose: No congestion/rhinnorhea. Mouth/Throat: Mucous membranes are moist.  Oropharynx non-erythematous. Cardiovascular: Normal rate, regular rhythm. Grossly normal heart sounds.  Good peripheral circulation. Respiratory: Normal respiratory effort.  No retractions. Lungs CTAB. Gastrointestinal: Soft and nontender. No distention. Positive bowel sounds Musculoskeletal: No lower extremity tenderness nor edema.   Neurologic:  Normal speech and language.  Skin:  Skin is warm, dry and intact.  Psychiatric: Mood and affect are normal.   ____________________________________________   LABS (all labs ordered are listed, but only abnormal results are displayed)  Labs Reviewed  CBC - Abnormal; Notable for the following:       Result Value   RBC 3.69 (*)    Hemoglobin 11.9 (*)    HCT 33.4 (*)    All other components within normal limits  COMPREHENSIVE METABOLIC PANEL - Abnormal; Notable for the following:    Sodium 122 (*)    Potassium 2.9 (*)    Chloride 83 (*)    Glucose, Bld 158 (*)    GFR calc non Af Amer 58 (*)    All other components within normal limits  LIPASE, BLOOD  TROPONIN I  POCT GASTRIC OCCULT BLOOD (1-CARD TO LAB)  TYPE AND SCREEN   ____________________________________________  EKG  ED ECG REPORT I, Loney Hering, the attending physician, personally viewed and interpreted this ECG.   Date: 12/12/2016  EKG Time: 628  Rate: 86  Rhythm: normal sinus rhythm  Axis: left axis deviation  Intervals:none  ST&T Change: none  ____________________________________________  RADIOLOGY  none ____________________________________________   PROCEDURES  Procedure(s) performed: None  Procedures  Critical Care performed: No  ____________________________________________   INITIAL IMPRESSION / ASSESSMENT AND PLAN / ED COURSE  Pertinent labs & imaging results that were available during my care of the patient were reviewed by me and  considered in my medical decision making (see chart for details).  This is a 66 year old female who comes into the hospital today with vomiting and diarrhea. Although the patient was scheduled for a colonoscopy and endoscopy she's been vomiting up coffee ground and black  emesis with a concern for upper gastrointestinal bleed. The patient did receive a dose of Reglan 10 mg IV 1 dose when she initially arrived but she was still nauseous so I then gave her 4 mg of Zofran and some Ativan 0.5 mg I will give the patient some protonic and place her on a Protonix drip. I contacted the admitting doctors since the patient's sodium is 122 and I also contacted Dr. Vicente Males the GI physician who will see the patient in consult. The patient will receive some normal saline and she'll be admitted to the hospitalist service.      ____________________________________________   FINAL CLINICAL IMPRESSION(S) / ED DIAGNOSES  Final diagnoses:  Intractable vomiting with nausea, unspecified vomiting type  Hyponatremia  Gastrointestinal hemorrhage associated with acute gastritis      NEW MEDICATIONS STARTED DURING THIS VISIT:  New Prescriptions   No medications on file     Note:  This document was prepared using Dragon voice recognition software and may include unintentional dictation errors.    Loney Hering, MD 12/12/16 [phone]

## 2016-12-12 NOTE — Consult Note (Signed)
Date: 12/12/2016                  Patient Name:  Abigail Ortega  MRN: [id number]  DOB: April 01, 1951  Age / Sex: 66 y.o., female         PCP: Perrin Maltese, MD                 Service Requesting Consult: IM hospialist/ Bettey Costa, MD                 Reason for Consult: hyponatremia            History of Present Illness: Patient is a 66 y.o. female with medical problems of CHF, mild LVH, breast cancer 2015 treated with surgery, chemotherapy and radiation, coronary disease with angioplasty and stent, who was admitted to [facility] on 12/12/2016 for evaluation of nausea and vomiting.  Patient reports that she started to have acute nausea, vomiting and diarrhea after taking prep for colonoscopy.  She has a history of hemorrhoids.  Coffee grounds emesis was noted. In the ER, she was found to have severe hyponatremia, therefore admitted for further evaluation and management Currently getting treatment but IV normal saline with potassium chloride Presenting sodium is 122.  It has remained at 122 despite IV normal saline.  Nephrology consult has been requested for evaluation   Medications: Outpatient medications: Prescriptions Prior to Admission  Medication Sig Dispense Refill Last Dose  . amLODipine (NORVASC) 10 MG tablet Take 10 mg by mouth daily.   12/11/2016 at 1100  . aspirin EC 81 MG tablet Take by mouth.   12/11/2016 at 1100  . atorvastatin (LIPITOR) 40 MG tablet Take 1 tablet by mouth once daily   12/11/2016 at 1100  . Calcium 600-200 MG-UNIT tablet Take 1 tablet by mouth daily.   12/11/2016 at 2100  . clopidogrel (PLAVIX) 75 MG tablet Take by mouth daily.    12/11/2016 at 1100  . Ferrous Sulfate (IRON) 325 (65 Fe) MG TABS Take by mouth.   12/11/2016 at 1100  . letrozole (FEMARA) 2.5 MG tablet TAKE 1 TABLET BY MOUTH  DAILY 90 tablet 2 12/11/2016 at 1100  . levothyroxine (SYNTHROID, LEVOTHROID) 75 MCG tablet Take 75 mcg by mouth daily before breakfast.   12/11/2016 at 1100  . montelukast  (SINGULAIR) 10 MG tablet Take 10 mg by mouth at bedtime.   12/11/2016 at 2100  . pantoprazole (PROTONIX) 40 MG tablet TAKE 1 TABLET BY MOUTH  DAILY 90 tablet 2 12/11/2016 at 1100  . traZODone (DESYREL) 50 MG tablet at bedtime.    12/11/2016 at 2100  . acetaminophen (TYLENOL 8 HOUR ARTHRITIS PAIN) 650 MG CR tablet Take 650 mg by mouth every 8 (eight) hours as needed for pain.   prn at prn  . ALPRAZolam (XANAX) 0.25 MG tablet Take 0.25 mg by mouth at bedtime as needed.   2 prn at prn  . hydrocortisone (ANUSOL-HC) 25 MG suppository Place 1 suppository (25 mg total) rectally 2 (two) times daily. 12 suppository 1 prn at prn  . oxymetazoline (AFRIN) 0.05 % nasal spray Place 2 sprays into left nostril as needed for congestion (For nosebleed). 15 mL 2 prn at prn  . PROCTOSOL HC 2.5 % rectal cream    prn at prn  . temazepam (RESTORIL) 30 MG capsule Take 30 mg by mouth at bedtime.   12/10/2016 at 2100    Current medications: Current Facility-Administered Medications  Medication Dose Route Frequency Provider Last Rate Last  Dose  . 0.9 %  sodium chloride infusion   Intravenous Continuous Mody, Sital, MD 100 mL/hr at 12/12/16 1500 1,000 mL at 12/12/16 1500  . acetaminophen (TYLENOL) tablet 650 mg  650 mg Oral Q6H PRN Bettey Costa, MD       Or  . acetaminophen (TYLENOL) suppository 650 mg  650 mg Rectal Q6H PRN Mody, Sital, MD      . HYDROcodone-acetaminophen (NORCO/VICODIN) 5-325 MG per tablet 1-2 tablet  1-2 tablet Oral Q4H PRN Bettey Costa, MD      . hydrocortisone (ANUSOL-HC) suppository 25 mg  25 mg Rectal QHS [name], MD      . morphine 2 MG/ML injection 1 mg  1 mg Intravenous Q4H PRN Bettey Costa, MD   1 mg at 12/12/16 1452  . ondansetron (ZOFRAN) tablet 4 mg  4 mg Oral Q6H PRN Bettey Costa, MD       Or  . ondansetron (ZOFRAN) injection 4 mg  4 mg Intravenous Q6H PRN Bettey Costa, MD   4 mg at 12/12/16 1113  . pantoprazole (PROTONIX) 80 mg in sodium chloride 0.9 % 250 mL (0.32 mg/mL) infusion  8 mg/hr  Intravenous Continuous Loney Hering, MD 25 mL/hr at 12/12/16 0849 8 mg/hr at 12/12/16 0849  . [START ON 12/15/2016] pantoprazole (PROTONIX) injection 40 mg  40 mg Intravenous Q12H Bettey Costa, MD       Facility-Administered Medications Ordered in Other Encounters  Medication Dose Route Frequency Provider Last Rate Last Dose  . sodium chloride 0.9 % injection 10 mL  10 mL Intravenous PRN Lloyd Huger, MD   10 mL at 02/24/15 0951  . sodium chloride flush (NS) 0.9 % injection 10 mL  10 mL Intravenous PRN Evlyn Kanner, NP   10 mL at 01/27/16 1446      Allergies: No Known Allergies    Past Medical History: Past Medical History:  Diagnosis Date  . Breast cancer [facility]) 2014   Right breast cancer - chemo, radiation and Mastectomy  . CHF (congestive heart failure) ([place]) 2013  . Clotting disorder ([place])    blood clots  . Heart attack [facility]) 2012   coronary stent x2 completed by Dr. Clayborn Bigness.  Marland Kitchen Heart attack ([place]) Apr 29, 2013  . Hemorrhoids   . Hypertension   . IBS (irritable bowel syndrome)   . Malignant neoplasm of lower-inner quadrant of female breast [facility]) January 20, 2013.   invasive mammary cancer, minimal 0.85 cm.  Histologic grade 1.  . Personal history of chemotherapy 2014   BREAST CA  . Personal history of radiation therapy 2014   BREAST CA  . Thyroid disease      Past Surgical History: Past Surgical History:  Procedure Laterality Date  . BREAST SURGERY Right 02-21-2013   right mastectomy  . COLONOSCOPY W/ BIOPSIES  09/15/2010   Colonoscopy completed by Verdie Shire, M.D. Tubular adenoma of the ascending colon and descending colon reported up to 0.4 cm in diameter. No atypia.  Marland Kitchen MASTECTOMY Right 2014   BREAST CA  . UPPER GI ENDOSCOPY  09/15/2010     Completed by Verdie Shire, M.D. for nausea. Normal exam reported.  Marland Kitchen VASCULAR SURGERY       Family History: Family History  Problem Relation Age of Onset  . Cancer Mother 60       breast  . Breast cancer Mother 76   . Cancer Father        colon     Social History:  Social History   Social History  . Marital status: Divorced    Spouse name: N/A  . Number of children: N/A  . Years of education: N/A   Occupational History  . Not on file.   Social History Main Topics  . Smoking status: Former Smoker    Years: 20.00  . Smokeless tobacco: Never Used  . Alcohol use No  . Drug use: No  . Sexual activity: No   Other Topics Concern  . Not on file   Social History Narrative  . No narrative on file     Review of Systems: Gen: no fevers or chills HEENT: no vision or hearing problems CV: reports chest tightness, no shortness of breath Resp: no cough or wheezing DT:OIZTIW nausea, vomiting and coffee-ground emesis as noted above GU : no dysuria, hematuria or history of kidney stones MS: ambulatory at home Derm:  no complaints Psych:no complaints Heme: no complaints Neuro: no complaints Endocrine.  No complaints  Vital Signs: Blood pressure 140/75, pulse 81, temperature 98 F (36.7 C), temperature source Oral, resp. rate 18, height 5\' 4"  (1.626 m), weight 79.4 kg (175 lb), SpO2 100 %.   Intake/Output Summary (Last 24 hours) at 12/12/16 1536 Last data filed at 12/12/16 1413  Gross per 24 hour  Intake                0 ml  Output             3400 ml  Net            -3400 ml    Weight trends: [employer]   12/12/16 5809  Weight: 79.4 kg (175 lb)    Physical Exam: General: Elderly female, laying in the bed  HEENT Anicteric, moist oral mucous membranes  Neck:  supple, no JVD  Lungs: Normal effort, clear  Heart:: regular., no rub or gallop  Abdomen: Soft, nontender  Extremities:  no edema  Neurologic: Alert, oriented  Skin: No acute rashes             Lab results: Basic Metabolic Panel:  Recent Labs Lab 12/12/16 0624 12/12/16 1154  NA 122* 122*  K 2.9* 3.9  CL 83* 87*  CO2 26 26  GLUCOSE 158* 138*  BUN 11 8  CREATININE 0.99 0.94  CALCIUM 9.5 9.3  MG  1.1*  --   PHOS 2.8  --     Liver Function Tests:  Recent Labs Lab 12/12/16 0624  AST 32  ALT 19  ALKPHOS 113  BILITOT 0.8  PROT 7.6  ALBUMIN 4.4    Recent Labs Lab 12/12/16 0624  LIPASE 33   No results for input(s): AMMONIA in the last 168 hours.  CBC:  Recent Labs Lab 12/12/16 0624  WBC 10.4  HGB 11.9*  HCT 33.4*  MCV 90.7  PLT 353    Cardiac Enzymes:  Recent Labs Lab 12/12/16 0624  TROPONINI <0.03    BNP: Invalid input(s): POCBNP  CBG: No results for input(s): GLUCAP in the last 168 hours.  Microbiology: No results found for this or any previous visit (from the past 720 hour(s)).   Coagulation Studies: No results for input(s): LABPROT, INR in the last 72 hours.  Urinalysis: No results for input(s): COLORURINE, LABSPEC, PHURINE, GLUCOSEU, HGBUR, BILIRUBINUR, KETONESUR, PROTEINUR, UROBILINOGEN, NITRITE, LEUKOCYTESUR in the last 72 hours.  Invalid input(s): APPERANCEUR      Imaging:  No results found.   Assessment & Plan: Pt is a 66 y.o. African American  female with a PMHX of coronary disease with stents, congestive heart failure, mild LVH, rest cancer in 2015 treated with surgery, chemotherapy and radiation, was admitted on 12/12/2016 with nausea, vomiting, coffee-ground emesis after taking colonoscopy prep and was also found to have hyponatremia.   1.  Acute on Chronic hyponatremia - workup so far shows TSH is normal 2-D echo in February showed EF of 40% with mild LVH No urine studies are available at present Patient sodium level has remained the same despite IV normal saline supplementation Urine output is 3900 cc  Acute component of hyponatremia, is likely secondary to volume depletion from severe nausea and vomiting It is expected to correct with IV normal saline Most recent previous sodium level is from April 19 but it was 129.  Calcium was mildly elevated at bedtime. We will obtain serum cortisol in the morning, SPEP, UPEP  We  will continue to monitor sodium level frequently  2.  Hypokalemia Again likely secondary to GI losses Replace PRN

## 2016-12-12 NOTE — Progress Notes (Signed)
MEDICATION RELATED CONSULT NOTE - INITIAL   Pharmacy Consult for electrolyte management Indication: hypokalemia  No Known Allergies  Patient Measurements: Height: 5\' 4"  (162.6 cm) Weight: 175 lb (79.4 kg) IBW/kg (Calculated) : 54.7 Adjusted Body Weight:   Vital Signs: Temp: 98.2 F (36.8 C) (05/29 0625) Temp Source: Oral (05/29 0625) BP: 150/76 (05/29 0740) Pulse Rate: 83 (05/29 0740) Intake/Output from previous day: 05/28 0701 - 05/29 0700 In: -  Out: 300 [Emesis/NG output:300] Intake/Output from this shift: No intake/output data recorded.  Labs:  Recent Labs  12/12/16 0624  WBC 10.4  HGB 11.9*  HCT 33.4*  PLT 353  CREATININE 0.99  MG 1.1*  PHOS 2.8  ALBUMIN 4.4  PROT 7.6  AST 32  ALT 19  ALKPHOS 113  BILITOT 0.8   Estimated Creatinine Clearance: 57 mL/min (by C-G formula based on SCr of 0.99 mg/dL).   Microbiology: No results found for this or any previous visit (from the past 720 hour(s)).  Medical History: Past Medical History:  Diagnosis Date  . Breast cancer [facility]) 2014   Right breast cancer - chemo, radiation and Mastectomy  . CHF (congestive heart failure) ([place]) 2013  . Clotting disorder ([place])    blood clots  . Heart attack [facility]) 2012   coronary stent x2 completed by Dr. Clayborn Bigness.  Marland Kitchen Heart attack ([place]) Apr 29, 2013  . Hemorrhoids   . Hypertension   . IBS (irritable bowel syndrome)   . Malignant neoplasm of lower-inner quadrant of female breast [facility]) January 20, 2013.   invasive mammary cancer, minimal 0.85 cm.  Histologic grade 1.  . Personal history of chemotherapy 2014   BREAST CA  . Personal history of radiation therapy 2014   BREAST CA  . Thyroid disease     Medications:  Infusions:  . magnesium sulfate 1 - 4 g bolus IVPB    . pantoprozole (PROTONIX) infusion    . potassium chloride    . potassium chloride      Assessment: 65 yof cc hematemesis and hematochezia with NVD. Started vomiting last night after starting bowel prep for  colonoscopy this AM. Hypokalemia noted in ED, pharmacy consulted to replace electrolytes. Add-on magnesium level noted to be low as well.   Goal of Therapy:  Electrolytes WNL.  Plan:  Potassium chloride 10 mEq IV x 1 in ED as well as magnesium sulfate 4 gm IV x 1. Will give additional potassium chloride 10 mEq IV Q1H x 4 doses once patient reaches inpatient unit. Recheck BMP this evening and all electrolytes tomorrow with AM labs.  Laural Benes, Pharm.D., BCPS Clinical Pharmacist 12/12/2016,8:05 AM

## 2016-12-12 NOTE — ED Triage Notes (Signed)
Patient arrived from home via EMS with c/o coffee ground emesis and diarrhea.  She is scheduled to have a colonoscopy and endoscopy today and has been taking bowel prep and the coffee ground emesis started this morning.  She reports chest tightening during emesis episodes.  She has hx of hypertension and right side breast cx.  EMS gave pt 4 mg Zofram IV en route to ED.  Patient in NAD during triage and MD present.  VS WNL.

## 2016-12-12 NOTE — Progress Notes (Signed)
Called Dr. Ara Kussmaul regarding home sleeping medication per patient request.  Appropriate orders were placed.  Abigail Ortega  12/12/2016  10:34 PM

## 2016-12-12 NOTE — Consult Note (Signed)
[name] MD, MRCP(U.K) [address]  [place]  [place], [place] [address]  Main: [phone]  Fax: [phone]  Consultation  Referring Provider:    Dr Benjie Karvonen Primary Care Physician:  Perrin Maltese, MD Primary Gastroenterologist:  Dr. Vicente Males          Reason for Consultation:     GI bleed   Date of Admission:  12/12/2016 Date of Consultation:  12/12/2016         HPI:   Abigail Ortega is a 66 y.o. female who is known to me in the outpatient. I was in the process of evaluating her for nausea ongoing for a few years.She had been evaluated for nausea even back in 2015 with Dr Zettie Pho . She has a family history of colon cancer and personal history of colon polyps. Last colonoscopy was back in 2012 and she was suggested a repeat in 3 years time.   At her last office visit with me on 11/02/16 my plan was for an EGD to evaluate the nausea as well as a surveillance colonoscopy. I also felt she had a fissure due to her anal pain and gave her a course of Anusol.She was on plavix. At that time she also mentioned unintentional weight loss of 20 lbs.   She was supposed to have her procedures this morning with me , started her prep last night and subsequently had nausea/vomiting which was coffee ground in appearance and dark colored stool.She states that the vomiting was initially clear and then changed to coffee ground in color which has since stopped. Had "dairrhea" while taking her bowel prep which has since resolved.  Came into the ER was found to have hyponatremia likely secondary to dehydration and admitted.   Presently feels well and wants to have something to drink   Past Medical History:  Diagnosis Date  . Breast cancer [facility]) 2014   Right breast cancer - chemo, radiation and Mastectomy  . CHF (congestive heart failure) ([place]) 2013  . Clotting disorder ([place])    blood clots  . Heart attack [facility]) 2012   coronary stent x2 completed by Dr. Clayborn Bigness.  Marland Kitchen Heart attack ([place]) Apr 29, 2013    . Hemorrhoids   . Hypertension   . IBS (irritable bowel syndrome)   . Malignant neoplasm of lower-inner quadrant of female breast [facility]) January 20, 2013.   invasive mammary cancer, minimal 0.85 cm.  Histologic grade 1.  . Personal history of chemotherapy 2014   BREAST CA  . Personal history of radiation therapy 2014   BREAST CA  . Thyroid disease     Past Surgical History:  Procedure Laterality Date  . BREAST SURGERY Right 02-21-2013   right mastectomy  . COLONOSCOPY W/ BIOPSIES  09/15/2010   Colonoscopy completed by Verdie Shire, M.D. Tubular adenoma of the ascending colon and descending colon reported up to 0.4 cm in diameter. No atypia.  Marland Kitchen MASTECTOMY Right 2014   BREAST CA  . UPPER GI ENDOSCOPY  09/15/2010     Completed by Verdie Shire, M.D. for nausea. Normal exam reported.  Marland Kitchen VASCULAR SURGERY      Prior to Admission medications   Medication Sig Start Date End Date Taking? Authorizing Provider  amLODipine (NORVASC) 10 MG tablet Take 10 mg by mouth daily.   Yes [provider]  aspirin EC 81 MG tablet Take by mouth.   Yes [provider]  atorvastatin (LIPITOR) 40 MG tablet Take 1 tablet by mouth once  daily 06/08/15  Yes [provider]  Calcium 600-200 MG-UNIT tablet Take 1 tablet by mouth daily.   Yes [provider]  clopidogrel (PLAVIX) 75 MG tablet Take by mouth daily.  10/26/14  Yes [provider]  Ferrous Sulfate (IRON) 325 (65 Fe) MG TABS Take by mouth.   Yes [provider]  letrozole (FEMARA) 2.5 MG tablet TAKE 1 TABLET BY MOUTH  DAILY 08/06/16  Yes Lloyd Huger, MD  levothyroxine (SYNTHROID, LEVOTHROID) 75 MCG tablet Take 75 mcg by mouth daily before breakfast.   Yes [provider]  montelukast (SINGULAIR) 10 MG tablet Take 10 mg by mouth at bedtime.   Yes [provider]  pantoprazole (PROTONIX) 40 MG tablet TAKE 1 TABLET BY MOUTH  DAILY 08/06/16  Yes Lloyd Huger, MD  traZODone (DESYREL) 50 MG  tablet at bedtime.  10/05/14  Yes [provider]  acetaminophen (TYLENOL 8 HOUR ARTHRITIS PAIN) 650 MG CR tablet Take 650 mg by mouth every 8 (eight) hours as needed for pain.    [provider]  ALPRAZolam Duanne Moron) 0.25 MG tablet Take 0.25 mg by mouth at bedtime as needed.  04/23/15   [provider]  hydrocortisone (ANUSOL-HC) 25 MG suppository Place 1 suppository (25 mg total) rectally 2 (two) times daily. 11/16/16   [name], MD  oxymetazoline (AFRIN) 0.05 % nasal spray Place 2 sprays into left nostril as needed for congestion (For nosebleed). 04/11/16 04/11/17  Beers, Pierce Crane, PA-C  PROCTOSOL HC 2.5 % rectal cream  09/08/16   [provider]    Family History  Problem Relation Age of Onset  . Cancer Mother 45       breast  . Breast cancer Mother 59  . Cancer Father        colon     Social History  Substance Use Topics  . Smoking status: Former Smoker    Years: 20.00  . Smokeless tobacco: Never Used  . Alcohol use No    Allergies as of 12/12/2016  . (No Known Allergies)    Review of Systems:    All systems reviewed and negative except where noted in HPI.   Physical Exam:  Vital signs in last 24 hours: Temp:  [98.2 F (36.8 C)] 98.2 F (36.8 C) (05/29 0833) Pulse Rate:  [83-89] 89 (05/29 0833) Resp:  [16-24] 20 (05/29 0833) BP: (115-150)/(61-78) 115/61 (05/29 0833) SpO2:  [99 %-100 %] 100 % (05/29 0833) Weight:  [175 lb (79.4 kg)] 175 lb (79.4 kg) (05/29 7741)   General:   Pleasant, cooperative in NAD Head:  Normocephalic and atraumatic. Eyes:   No icterus.   Conjunctiva pink. PERRLA. Ears:  Normal auditory acuity. Neck:  Supple; no masses or thyroidomegaly Lungs: Respirations even and unlabored. Lungs clear to auscultation bilaterally.   No wheezes, crackles, or rhonchi.  Heart:  Regular rate and rhythm;  Without murmur, clicks, rubs or gallops Abdomen:  Soft, nondistended, nontender. Normal bowel sounds. No appreciable masses or  hepatomegaly.  No rebound or guarding.  Rectal:  Not performed. Msk:  Symmetrical without gross deformities.  Extremities:  Without edema, cyanosis or clubbing. Neurologic:  Alert and oriented x3;  grossly normal neurologically. Psych:  Alert and cooperative. Normal affect.  LAB RESULTS:  Recent Labs  12/12/16 0624  WBC 10.4  HGB 11.9*  HCT 33.4*  PLT 353   CBC Latest Ref Rng & Units 12/12/2016 11/02/2016 04/24/2014  WBC 3.6 - 11.0 K/uL 10.4 7.1 6.5  Hemoglobin 12.0 - 16.0 g/dL 11.9(L) 12.5 10.2(L)  Hematocrit 35.0 - 47.0 % 33.4(L) 36.8 30.4(L)  Platelets 150 - 440 K/uL 353 399 284   BMET  Recent Labs  12/12/16 0624  NA 122*  K 2.9*  CL 83*  CO2 26  GLUCOSE 158*  BUN 11  CREATININE 0.99  CALCIUM 9.5   LFT  Recent Labs  12/12/16 0624  PROT 7.6  ALBUMIN 4.4  AST 32  ALT 19  ALKPHOS 113  BILITOT 0.8   PT/INR No results for input(s): LABPROT, INR in the last 72 hours.  STUDIES: No results found.    Impression / Plan:   Abigail Ortega is a 66 y.o. y/o female with history of long standing nausea and colon polyps ,weight loss that has been unintentional  was supposed to come in today for an elective EGD+colonoscopy  . She developed severe vomiting after commencing the bowel prep last night and was coffee grounds in appearance. She has been admitted for the same along with hypochloremic, hypokalemic hyponatremia likely secondary to dehydration from vomiting. The coffee ground emesis could be from a mallory weiss tear from the retching.   Plan  1. Correct electrolytes - likely low due to vomiting  2. Plan for EGD tomorrow once electrolytes are normalized 3. PPI 4. Can stay on clear liquid diet till midnight if she can tolerate.   I have discussed alternative options, risks & benefits,  which include, but are not limited to, bleeding, infection, perforation,respiratory complication & drug reaction.  The patient agrees with this plan & written consent will be  obtained.    Thank you for involving me in the care of this patient.      LOS: 0 days   [name], MD  12/12/2016, 9:17 AM

## 2016-12-12 NOTE — H&P (Signed)
[place] [facility] [place] NAME: Abigail Ortega    MR#:  [id number]  DATE OF BIRTH:  1950-12-13  DATE OF ADMISSION:  12/12/2016  PRIMARY CARE PHYSICIAN: Perrin Maltese, MD   REQUESTING/REFERRING PHYSICIAN: dr Dahlia Client  CHIEF COMPLAINT:   Nausea and vomiting HISTORY OF PRESENT ILLNESS:  Abigail Ortega  is a 66 y.o. female with a known history of Breast cancer and systolic heart failure ejection fraction 40% who presents with above complaint. Patient is scheduled for colonoscopy and endoscopy this morning a.m. Patient started her prep last night and started having nausea and vomiting about 1-2 hours afterwards. She reports that this is coffee-ground in appearance. She has also had some dark colored stools. She denies abdominal pain. She has had persistent nausea since yesterday. She was on Plavix but this has been discontinued for some time now.  PAST MEDICAL HISTORY:   Past Medical History:  Diagnosis Date  . Breast cancer [facility]) 2014   Right breast cancer - chemo, radiation and Mastectomy  . CHF (congestive heart failure) ([place]) 2013  . Clotting disorder ([place])    blood clots  . Heart attack [facility]) 2012   coronary stent x2 completed by Dr. Clayborn Bigness.  Marland Kitchen Heart attack ([place]) Apr 29, 2013  . Hemorrhoids   . Hypertension   . IBS (irritable bowel syndrome)   . Malignant neoplasm of lower-inner quadrant of female breast [facility]) January 20, 2013.   invasive mammary cancer, minimal 0.85 cm.  Histologic grade 1.  . Personal history of chemotherapy 2014   BREAST CA  . Personal history of radiation therapy 2014   BREAST CA  . Thyroid disease     PAST SURGICAL HISTORY:   Past Surgical History:  Procedure Laterality Date  . BREAST SURGERY Right 02-21-2013   right mastectomy  . COLONOSCOPY W/ BIOPSIES  09/15/2010   Colonoscopy completed by Verdie Shire, M.D. Tubular adenoma of the ascending colon and descending colon reported up to 0.4 cm in diameter. No  atypia.  Marland Kitchen MASTECTOMY Right 2014   BREAST CA  . UPPER GI ENDOSCOPY  09/15/2010     Completed by Verdie Shire, M.D. for nausea. Normal exam reported.  Marland Kitchen VASCULAR SURGERY      SOCIAL HISTORY:   Social History  Substance Use Topics  . Smoking status: Former Smoker    Years: 20.00  . Smokeless tobacco: Never Used  . Alcohol use No    FAMILY HISTORY:   Family History  Problem Relation Age of Onset  . Cancer Mother 14       breast  . Breast cancer Mother 44  . Cancer Father        colon    DRUG ALLERGIES:  No Known Allergies  REVIEW OF SYSTEMS:   Review of Systems  Constitutional: Negative.  Negative for chills, fever and malaise/fatigue.  HENT: Negative.  Negative for ear discharge, ear pain, hearing loss, nosebleeds and sore throat.   Eyes: Negative.  Negative for blurred vision and pain.  Respiratory: Negative.  Negative for cough, hemoptysis, shortness of breath and wheezing.   Cardiovascular: Negative.  Negative for chest pain, palpitations and leg swelling.  Gastrointestinal: Positive for diarrhea, melena, nausea and vomiting. Negative for abdominal pain and blood in stool.  Genitourinary: Negative.  Negative for dysuria.  Musculoskeletal: Negative.  Negative for back pain.  Skin: Negative.   Neurological: Negative for dizziness, tremors, speech change, focal weakness, seizures and headaches.  Endo/Heme/Allergies: Negative.  Does  not bruise/bleed easily.  Psychiatric/Behavioral: Negative.  Negative for depression, hallucinations and suicidal ideas.    MEDICATIONS AT HOME:   Prior to Admission medications   Medication Sig Start Date End Date Taking? Authorizing Provider  amLODipine (NORVASC) 10 MG tablet Take 10 mg by mouth daily.   Yes [provider]  aspirin EC 81 MG tablet Take by mouth.   Yes [provider]  atorvastatin (LIPITOR) 40 MG tablet Take 1 tablet by mouth once daily 06/08/15  Yes [provider]  Calcium 600-200 MG-UNIT  tablet Take 1 tablet by mouth daily.   Yes [provider]  clopidogrel (PLAVIX) 75 MG tablet Take by mouth daily.  10/26/14  Yes [provider]  Ferrous Sulfate (IRON) 325 (65 Fe) MG TABS Take by mouth.   Yes [provider]  letrozole (FEMARA) 2.5 MG tablet TAKE 1 TABLET BY MOUTH  DAILY 08/06/16  Yes Lloyd Huger, MD  levothyroxine (SYNTHROID, LEVOTHROID) 75 MCG tablet Take 75 mcg by mouth daily before breakfast.   Yes [provider]  montelukast (SINGULAIR) 10 MG tablet Take 10 mg by mouth at bedtime.   Yes [provider]  pantoprazole (PROTONIX) 40 MG tablet TAKE 1 TABLET BY MOUTH  DAILY 08/06/16  Yes Lloyd Huger, MD  traZODone (DESYREL) 50 MG tablet at bedtime.  10/05/14  Yes [provider]  acetaminophen (TYLENOL 8 HOUR ARTHRITIS PAIN) 650 MG CR tablet Take 650 mg by mouth every 8 (eight) hours as needed for pain.    [provider]  ALPRAZolam Duanne Moron) 0.25 MG tablet Take 0.25 mg by mouth at bedtime as needed.  04/23/15   [provider]  hydrocortisone (ANUSOL-HC) 25 MG suppository Place 1 suppository (25 mg total) rectally 2 (two) times daily. 11/16/16   [name], MD  oxymetazoline (AFRIN) 0.05 % nasal spray Place 2 sprays into left nostril as needed for congestion (For nosebleed). 04/11/16 04/11/17  Beers, Pierce Crane, PA-C  PROCTOSOL HC 2.5 % rectal cream  09/08/16   [provider]      VITAL SIGNS:  Blood pressure (!) 150/76, pulse 83, temperature 98.2 F (36.8 C), temperature source Oral, resp. rate (!) 24, height 5\' 4"  (1.626 m), weight 79.4 kg (175 lb), SpO2 100 %.  PHYSICAL EXAMINATION:   Physical Exam  Constitutional: She is oriented to person, place, and time and well-developed, well-nourished, and in no distress. No distress.  HENT:  Head: Normocephalic.  Eyes: No scleral icterus.  Neck: Normal range of motion. Neck supple. No JVD present. No tracheal deviation present.   Cardiovascular: Normal rate, regular rhythm and normal heart sounds.  Exam reveals no gallop and no friction rub.   No murmur heard. Pulmonary/Chest: Effort normal and breath sounds normal. No respiratory distress. She has no wheezes. She has no rales. She exhibits no tenderness.  Abdominal: Soft. Bowel sounds are normal. She exhibits no distension and no mass. There is no tenderness. There is no rebound and no guarding.  Musculoskeletal: Normal range of motion. She exhibits no edema.  Neurological: She is alert and oriented to person, place, and time.  Skin: Skin is warm. No rash noted. No erythema.  Psychiatric: Affect and judgment normal.      LABORATORY PANEL:   CBC  Recent Labs Lab 12/12/16 0624  WBC 10.4  HGB 11.9*  HCT 33.4*  PLT 353   ------------------------------------------------------------------------------------------------------------------  Chemistries   Recent Labs Lab 12/12/16 0624  NA 122*  K 2.9*  CL  83*  CO2 26  GLUCOSE 158*  BUN 11  CREATININE 0.99  CALCIUM 9.5  MG 1.1*  AST 32  ALT 19  ALKPHOS 113  BILITOT 0.8   ------------------------------------------------------------------------------------------------------------------  Cardiac Enzymes  Recent Labs Lab 12/12/16 0624  TROPONINI <0.03   ------------------------------------------------------------------------------------------------------------------  RADIOLOGY:  No results found.  EKG:   Orders placed or performed in visit on 12/12/16  . EKG 12-Lead  . EKG 12-Lead    IMPRESSION AND PLAN:   66 year old female with a history of chronic systolic heart failure who was scheduled for colonoscopy and endoscopy who presents with coffee-ground emesis.  1. Upper GI bleed: Patient will have endoscopy tomorrow due to hyponatremia. I spoke with GI physician. Continue PPI. Monitor hemoglobin 2. Nausea and vomiting due to problem #1 Continue supportive care  3. Hyponatremia  from dehydration with nausea and vomiting Start IV fluids Nephrology consultation  4. Hypokalemia: This is from nausea and vomiting Replete and recheck Check magnesium level  5. Chronic systolic heart failure without exacerbation Monitor intake and output  6. History of breast cancer     All the records are reviewed and case discussed with ED provider. Management plans discussed with the patient and sh in agreement  CODE STATUS: full  TOTAL TIME TAKING CARE OF THIS PATIENT: 45 minutes.    Shea Kapur M.D on 12/12/2016 at 8:11 AM  Between 7am to 6pm - Pager - [phone]  After 6pm go to [email or web address] - password [email or web address] [facility]  Office  [phone]  CC: Primary care physician; Perrin Maltese, MD

## 2016-12-12 NOTE — Progress Notes (Signed)
MEDICATION RELATED CONSULT NOTE - INITIAL   Pharmacy Consult for electrolyte management Indication: hypokalemia  No Known Allergies  Patient Measurements: Height: 5\' 4"  (162.6 cm) Weight: 175 lb (79.4 kg) IBW/kg (Calculated) : 54.7 Adjusted Body Weight:   Vital Signs: Temp: 98 F (36.7 C) (05/29 1244) Temp Source: Oral (05/29 1244) BP: 140/75 (05/29 1244) Pulse Rate: 81 (05/29 1244) Intake/Output from previous day: 05/28 0701 - 05/29 0700 In: -  Out: 300 [Emesis/NG output:300] Intake/Output from this shift: Total I/O In: -  Out: 3100 [Urine:3100]  Labs:  Recent Labs  12/12/16 0624 12/12/16 1154 12/12/16 1707  WBC 10.4  --   --   HGB 11.9*  --   --   HCT 33.4*  --   --   PLT 353  --   --   CREATININE 0.99 0.94 0.92  MG 1.1*  --   --   PHOS 2.8  --   --   ALBUMIN 4.4  --   --   PROT 7.6  --   --   AST 32  --   --   ALT 19  --   --   ALKPHOS 113  --   --   BILITOT 0.8  --   --    Estimated Creatinine Clearance: 61.3 mL/min (by C-G formula based on SCr of 0.92 mg/dL).   Microbiology: No results found for this or any previous visit (from the past 720 hour(s)).  Medical History: Past Medical History:  Diagnosis Date  . Breast cancer [facility]) 2014   Right breast cancer - chemo, radiation and Mastectomy  . CHF (congestive heart failure) ([place]) 2013  . Clotting disorder ([place])    blood clots  . Heart attack [facility]) 2012   coronary stent x2 completed by Dr. Clayborn Bigness.  Marland Kitchen Heart attack ([place]) Apr 29, 2013  . Hemorrhoids   . Hypertension   . IBS (irritable bowel syndrome)   . Malignant neoplasm of lower-inner quadrant of female breast [facility]) January 20, 2013.   invasive mammary cancer, minimal 0.85 cm.  Histologic grade 1.  . Personal history of chemotherapy 2014   BREAST CA  . Personal history of radiation therapy 2014   BREAST CA  . Thyroid disease     Medications:  Infusions:  . sodium chloride 1,000 mL (12/12/16 1500)  . pantoprozole (PROTONIX) infusion 8  mg/hr (12/12/16 0849)    Assessment: 18 yof cc hematemesis and hematochezia with NVD. Started vomiting last night after starting bowel prep for colonoscopy this AM. Hypokalemia noted in ED, pharmacy consulted to replace electrolytes. 5/29 1700 K = 3.9  Goal of Therapy:  Electrolytes WNL.  Plan:  Will f/u electrolytes with am labs  Darrow Bussing, PharmD Pharmacy Resident 12/12/2016 5:51 PM

## 2016-12-12 NOTE — Progress Notes (Signed)
She complained of rectal buring and discomfort  Plan  Anusol supp

## 2016-12-13 ENCOUNTER — Encounter
Admission: EM | Disposition: A | Discharge status: Home / Self Care | Payer: Self-pay | Source: Home / Self Care | Attending: Internal Medicine

## 2016-12-13 ENCOUNTER — Inpatient Hospital Stay: Payer: Medicare Other | Admitting: Anesthesiology

## 2016-12-13 ENCOUNTER — Encounter: Payer: Self-pay | Admitting: Anesthesiology

## 2016-12-13 DIAGNOSIS — R112 Nausea with vomiting, unspecified: Secondary | ICD-10-CM | POA: Diagnosis not present

## 2016-12-13 DIAGNOSIS — K920 Hematemesis: Secondary | ICD-10-CM | POA: Diagnosis not present

## 2016-12-13 DIAGNOSIS — K226 Gastro-esophageal laceration-hemorrhage syndrome: Secondary | ICD-10-CM | POA: Diagnosis not present

## 2016-12-13 HISTORY — PX: ESOPHAGOGASTRODUODENOSCOPY: SHX5428

## 2016-12-13 LAB — CBC
HEMATOCRIT: 31.1 % — AB (ref 35.0–47.0)
HEMOGLOBIN: 10.8 g/dL — AB (ref 12.0–16.0)
MCH: 31.5 pg (ref 26.0–34.0)
MCHC: 34.9 g/dL (ref 32.0–36.0)
MCV: 90.5 fL (ref 80.0–100.0)
Platelets: 346 10*3/uL (ref 150–440)
RBC: 3.43 MIL/uL — ABNORMAL LOW (ref 3.80–5.20)
RDW: 13.1 % (ref 11.5–14.5)
WBC: 5.7 10*3/uL (ref 3.6–11.0)

## 2016-12-13 LAB — BASIC METABOLIC PANEL
ANION GAP: 6 (ref 5–15)
Anion gap: 7 (ref 5–15)
BUN: 8 mg/dL (ref 6–20)
BUN: 9 mg/dL (ref 6–20)
CALCIUM: 9.3 mg/dL (ref 8.9–10.3)
CALCIUM: 9.2 mg/dL (ref 8.9–10.3)
CO2: 25 mmol/L (ref 22–32)
CO2: 25 mmol/L (ref 22–32)
Chloride: 99 mmol/L — ABNORMAL LOW (ref 101–111)
Chloride: 101 mmol/L (ref 101–111)
Creatinine, Ser: 0.96 mg/dL (ref 0.44–1.00)
Creatinine, Ser: 1.06 mg/dL — ABNORMAL HIGH (ref 0.44–1.00)
GFR calc Af Amer: >60 mL/min (ref >60)
GFR calc Af Amer: >60 mL/min (ref >60)
GFR calc non Af Amer: >60 mL/min (ref >60)
GFR calc non Af Amer: 53 mL/min — ABNORMAL LOW (ref >60)
GLUCOSE: 95 mg/dL (ref 65–99)
Glucose, Bld: 99 mg/dL (ref 65–99)
POTASSIUM: 3.8 mmol/L (ref 3.5–5.1)
POTASSIUM: 4.1 mmol/L (ref 3.5–5.1)
SODIUM: 130 mmol/L — AB (ref 135–145)
SODIUM: 133 mmol/L — AB (ref 135–145)

## 2016-12-13 LAB — MAGNESIUM: MAGNESIUM: 2 mg/dL (ref 1.7–2.4)

## 2016-12-13 LAB — PHOSPHORUS: PHOSPHORUS: 3.4 mg/dL (ref 2.5–4.6)

## 2016-12-13 SURGERY — EGD (ESOPHAGOGASTRODUODENOSCOPY)
Anesthesia: General

## 2016-12-13 MED ORDER — PROPOFOL 500 MG/50ML IV EMUL
INTRAVENOUS | Status: DC | PRN
  Administered 2016-12-13: 150 ug/kg/min via INTRAVENOUS

## 2016-12-13 MED ORDER — PROPOFOL 10 MG/ML IV BOLUS
INTRAVENOUS | Status: DC | PRN
  Administered 2016-12-13: 50 mg via INTRAVENOUS
  Administered 2016-12-13: 20 mg via INTRAVENOUS

## 2016-12-13 MED ORDER — PANTOPRAZOLE SODIUM 40 MG PO TBEC: 40.0000 mg | DELAYED_RELEASE_TABLET | Freq: Two times a day (BID) | ORAL | 0 refills | Status: DC

## 2016-12-13 NOTE — H&P (Signed)
[name] MD [address], [place], [place] [address] Phone: [phone] Fax : [phone]  Primary Care Physician:  Perrin Maltese, MD Primary Gastroenterologist:  Dr. [name]   Pre-Procedure History & Physical: HPI:  Abigail Ortega is a 66 y.o. female is here for an endoscopy.   Past Medical History:  Diagnosis Date  . Breast cancer [facility]) 2014   Right breast cancer - chemo, radiation and Mastectomy  . CHF (congestive heart failure) ([place]) 2013  . Clotting disorder ([place])    blood clots  . Heart attack [facility]) 2012   coronary stent x2 completed by Dr. Clayborn Bigness.  Marland Kitchen Heart attack ([place]) Apr 29, 2013  . Hemorrhoids   . Hypertension   . IBS (irritable bowel syndrome)   . Malignant neoplasm of lower-inner quadrant of female breast [facility]) January 20, 2013.   invasive mammary cancer, minimal 0.85 cm.  Histologic grade 1.  . Personal history of chemotherapy 2014   BREAST CA  . Personal history of radiation therapy 2014   BREAST CA  . Thyroid disease     Past Surgical History:  Procedure Laterality Date  . BREAST SURGERY Right 02-21-2013   right mastectomy  . COLONOSCOPY W/ BIOPSIES  09/15/2010   Colonoscopy completed by Verdie Shire, M.D. Tubular adenoma of the ascending colon and descending colon reported up to 0.4 cm in diameter. No atypia.  Marland Kitchen MASTECTOMY Right 2014   BREAST CA  . UPPER GI ENDOSCOPY  09/15/2010     Completed by Verdie Shire, M.D. for nausea. Normal exam reported.  Marland Kitchen VASCULAR SURGERY      Prior to Admission medications   Medication Sig Start Date End Date Taking? Authorizing Provider  amLODipine (NORVASC) 10 MG tablet Take 10 mg by mouth daily.   Yes [provider]  aspirin EC 81 MG tablet Take by mouth.   Yes [provider]  atorvastatin (LIPITOR) 40 MG tablet Take 1 tablet by mouth once daily 06/08/15  Yes [provider]  Calcium 600-200 MG-UNIT tablet Take 1 tablet by mouth daily.   Yes [provider]  clopidogrel  (PLAVIX) 75 MG tablet Take by mouth daily.  10/26/14  Yes [provider]  Ferrous Sulfate (IRON) 325 (65 Fe) MG TABS Take by mouth.   Yes [provider]  letrozole (FEMARA) 2.5 MG tablet TAKE 1 TABLET BY MOUTH  DAILY 08/06/16  Yes Lloyd Huger, MD  levothyroxine (SYNTHROID, LEVOTHROID) 75 MCG tablet Take 75 mcg by mouth daily before breakfast.   Yes [provider]  montelukast (SINGULAIR) 10 MG tablet Take 10 mg by mouth at bedtime.   Yes [provider]  pantoprazole (PROTONIX) 40 MG tablet TAKE 1 TABLET BY MOUTH  DAILY 08/06/16  Yes Lloyd Huger, MD  temazepam (RESTORIL) 30 MG capsule Take 30 mg by mouth at bedtime. 11/15/16  Yes [provider]  traZODone (DESYREL) 50 MG tablet at bedtime.  10/05/14  Yes [provider]  acetaminophen (TYLENOL 8 HOUR ARTHRITIS PAIN) 650 MG CR tablet Take 650 mg by mouth every 8 (eight) hours as needed for pain.    [provider]  ALPRAZolam Duanne Moron) 0.25 MG tablet Take 0.25 mg by mouth at bedtime as needed.  04/23/15   [provider]  hydrocortisone (ANUSOL-HC) 25 MG suppository Place 1 suppository (25 mg total) rectally 2 (two) times daily. 11/16/16   [name], MD  oxymetazoline (AFRIN) 0.05 % nasal spray Place 2 sprays into left nostril  as needed for congestion (For nosebleed). 04/11/16 04/11/17  Beers, Pierce Crane, PA-C  pantoprazole (PROTONIX) 40 MG tablet Take 1 tablet (40 mg total) by mouth 2 (two) times daily. 12/13/16   Bettey Costa, MD  PROCTOSOL HC 2.5 % rectal cream  09/08/16   [provider]    Allergies as of 12/12/2016  . (No Known Allergies)    Family History  Problem Relation Age of Onset  . Cancer Mother 26       breast  . Breast cancer Mother 20  . Cancer Father        colon    Social History   Social History  . Marital status: Divorced    Spouse name: N/A  . Number of children: N/A  . Years of education: N/A   Occupational History  . Not  on file.   Social History Main Topics  . Smoking status: Former Smoker    Years: 20.00  . Smokeless tobacco: Never Used  . Alcohol use No  . Drug use: No  . Sexual activity: No   Other Topics Concern  . Not on file   Social History Narrative  . No narrative on file    Review of Systems: See HPI, otherwise negative ROS  Physical Exam: BP 136/83 (BP Location: Right Arm)   Pulse 71   Temp 98.2 F (36.8 C) (Oral)   Resp 18   Ht 5\' 4"  (1.626 m)   Wt 175 lb (79.4 kg)   SpO2 100%   BMI 30.04 kg/m  General:   Alert,  pleasant and cooperative in NAD Head:  Normocephalic and atraumatic. Neck:  Supple; no masses or thyromegaly. Lungs:  Clear throughout to auscultation.    Heart:  Regular rate and rhythm. Abdomen:  Soft, nontender and nondistended. Normal bowel sounds, without guarding, and without rebound.   Neurologic:  Alert and  oriented x4;  grossly normal neurologically.  Impression/Plan: Abigail Ortega is here for an endoscopy to be performed for hematemesis  Risks, benefits, limitations, and alternatives regarding  endoscopy have been reviewed with the patient.  Questions have been answered.  All parties agreeable.   [name], MD  12/13/2016, 3:31 PM

## 2016-12-13 NOTE — Discharge Summary (Signed)
[place] [facility] [place] NAME: Abigail Ortega    MR#:  [id number]  DATE OF BIRTH:  [birth year]  DATE OF ADMISSION:  12/12/2016 ADMITTING PHYSICIAN: Bettey Costa, MD  DATE OF DISCHARGE: 12/13/2016  PRIMARY CARE PHYSICIAN: Perrin Maltese, MD    ADMISSION DIAGNOSIS:  Hyponatremia [E87.1] Gastrointestinal hemorrhage associated with acute gastritis [K29.01] Intractable vomiting with nausea, unspecified vomiting type [R11.2]  DISCHARGE DIAGNOSIS:  Active Problems:   GIB (gastrointestinal bleeding)   SECONDARY DIAGNOSIS:   Past Medical History:  Diagnosis Date  . Breast cancer [facility]) 2014   Right breast cancer - chemo, radiation and Mastectomy  . CHF (congestive heart failure) ([place]) 2013  . Clotting disorder ([place])    blood clots  . Heart attack [facility]) 2012   coronary stent x2 completed by Dr. Clayborn Bigness.  Marland Kitchen Heart attack ([place]) Apr 29, 2013  . Hemorrhoids   . Hypertension   . IBS (irritable bowel syndrome)   . Malignant neoplasm of lower-inner quadrant of female breast [facility]) January 20, 2013.   invasive mammary cancer, minimal 0.85 cm.  Histologic grade 1.  . Personal history of chemotherapy 2014   BREAST CA  . Personal history of radiation therapy 2014   BREAST CA  . Thyroid disease     HOSPITAL COURSE:   66 year old female with a history of chronic systolic heart failure who was scheduled for colonoscopy and endoscopy who presents with coffee-ground emesis.  1. Upper GI bleed: She underwent EGD which did not show evidence of bleed, however it is thought that she suffered a MW tear with the nausea that caused the UPper GIB. She will have outpatient follow up for colonoscopy with Dr Vicente Males  2. Nausea and vomiting due to problem #1 This has resolved  3. Hyponatremia from dehydration with nausea and vomiting Sodium level has improved to 1:30  4. Hypokalemia: this is been repleted  magnesium level was initially low however has been  corrected and 2.0 today  5. Chronic systolic heart failure without exacerbation Monitor intake and output  6. History of breast cancer    DISCHARGE CONDITIONS AND DIET:  Stable Regular diet  CONSULTS OBTAINED:  Treatment Team:  Murlean Iba, MD [name], MD  DRUG ALLERGIES:  No Known Allergies  DISCHARGE MEDICATIONS:   Current Discharge Medication List    CONTINUE these medications which have CHANGED   Details  pantoprazole (PROTONIX) 40 MG tablet Take 1 tablet (40 mg total) by mouth 2 (two) times daily. Qty: 30 tablet, Refills: 0      CONTINUE these medications which have NOT CHANGED   Details  amLODipine (NORVASC) 10 MG tablet Take 10 mg by mouth daily.    atorvastatin (LIPITOR) 40 MG tablet Take 1 tablet by mouth once daily    Calcium 600-200 MG-UNIT tablet Take 1 tablet by mouth daily.    Ferrous Sulfate (IRON) 325 (65 Fe) MG TABS Take by mouth.    letrozole (FEMARA) 2.5 MG tablet TAKE 1 TABLET BY MOUTH  DAILY Qty: 90 tablet, Refills: 2    levothyroxine (SYNTHROID, LEVOTHROID) 75 MCG tablet Take 75 mcg by mouth daily before breakfast.    montelukast (SINGULAIR) 10 MG tablet Take 10 mg by mouth at bedtime.    temazepam (RESTORIL) 30 MG capsule Take 30 mg by mouth at bedtime.    traZODone (DESYREL) 50 MG tablet at bedtime.     acetaminophen (TYLENOL 8 HOUR ARTHRITIS PAIN) 650 MG CR tablet Take 650 mg by  mouth every 8 (eight) hours as needed for pain.    ALPRAZolam (XANAX) 0.25 MG tablet Take 0.25 mg by mouth at bedtime as needed.  Refills: 2    hydrocortisone (ANUSOL-HC) 25 MG suppository Place 1 suppository (25 mg total) rectally 2 (two) times daily. Qty: 12 suppository, Refills: 1   Associated Diagnoses: Constipation, unspecified constipation type    PROCTOSOL HC 2.5 % rectal cream       STOP taking these medications     aspirin EC 81 MG tablet      clopidogrel (PLAVIX) 75 MG tablet      oxymetazoline (AFRIN) 0.05 % nasal spray            Today   CHIEF COMPLAINT:  Doing well no melena    VITAL SIGNS:  Blood pressure 124/76, pulse 67, temperature (!) 96.5 F (35.8 C), temperature source Tympanic, resp. rate 16, height 5\' 4"  (1.626 m), weight 79.4 kg (175 lb), SpO2 100 %.   REVIEW OF SYSTEMS:  Review of Systems  Constitutional: Negative.  Negative for chills, fever and malaise/fatigue.  HENT: Negative.  Negative for ear discharge, ear pain, hearing loss, nosebleeds and sore throat.   Eyes: Negative.  Negative for blurred vision and pain.  Respiratory: Negative.  Negative for cough, hemoptysis, shortness of breath and wheezing.   Cardiovascular: Negative.  Negative for chest pain, palpitations and leg swelling.  Gastrointestinal: Negative.  Negative for abdominal pain, blood in stool, diarrhea, nausea and vomiting.  Genitourinary: Negative.  Negative for dysuria.  Musculoskeletal: Negative.  Negative for back pain.  Skin: Negative.   Neurological: Negative for dizziness, tremors, speech change, focal weakness, seizures and headaches.  Endo/Heme/Allergies: Negative.  Does not bruise/bleed easily.  Psychiatric/Behavioral: Negative.  Negative for depression, hallucinations and suicidal ideas.     PHYSICAL EXAMINATION:  GENERAL:  66 y.o.-year-old patient lying in the bed with no acute distress.  NECK:  Supple, no jugular venous distention. No thyroid enlargement, no tenderness.  LUNGS: Normal breath sounds bilaterally, no wheezing, rales,rhonchi  No use of accessory muscles of respiration.  CARDIOVASCULAR: S1, S2 normal. No murmurs, rubs, or gallops.  ABDOMEN: Soft, non-tender, non-distended. Bowel sounds present. No organomegaly or mass.  EXTREMITIES: No pedal edema, cyanosis, or clubbing.  PSYCHIATRIC: The patient is alert and oriented x 3.  SKIN: No obvious rash, lesion, or ulcer.   DATA REVIEW:   CBC  Recent Labs Lab 12/13/16 0548  WBC 5.7  HGB 10.8*  HCT 31.1*  PLT 346    Chemistries    Recent Labs Lab 12/12/16 0624  12/13/16 0548 12/13/16 1114  NA 122*  < > 130* 133*  K 2.9*  < > 3.8 4.1  CL 83*  < > 99* 101  CO2 26  < > 25 25  GLUCOSE 158*  < > 99 95  BUN 11  < > 8 9  CREATININE 0.99  < > 0.96 1.06*  CALCIUM 9.5  < > 9.3 9.2  MG 1.1*  --  2.0  --   AST 32  --   --   --   ALT 19  --   --   --   ALKPHOS 113  --   --   --   BILITOT 0.8  --   --   --   < > = values in this interval not displayed.  Cardiac Enzymes  Recent Labs Lab 12/12/16 0624  TROPONINI <0.03    Microbiology Results  @MICRORSLT48 @  RADIOLOGY:  No  results found.    Current Discharge Medication List    CONTINUE these medications which have CHANGED   Details  pantoprazole (PROTONIX) 40 MG tablet Take 1 tablet (40 mg total) by mouth 2 (two) times daily. Qty: 30 tablet, Refills: 0      CONTINUE these medications which have NOT CHANGED   Details  amLODipine (NORVASC) 10 MG tablet Take 10 mg by mouth daily.    atorvastatin (LIPITOR) 40 MG tablet Take 1 tablet by mouth once daily    Calcium 600-200 MG-UNIT tablet Take 1 tablet by mouth daily.    Ferrous Sulfate (IRON) 325 (65 Fe) MG TABS Take by mouth.    letrozole (FEMARA) 2.5 MG tablet TAKE 1 TABLET BY MOUTH  DAILY Qty: 90 tablet, Refills: 2    levothyroxine (SYNTHROID, LEVOTHROID) 75 MCG tablet Take 75 mcg by mouth daily before breakfast.    montelukast (SINGULAIR) 10 MG tablet Take 10 mg by mouth at bedtime.    temazepam (RESTORIL) 30 MG capsule Take 30 mg by mouth at bedtime.    traZODone (DESYREL) 50 MG tablet at bedtime.     acetaminophen (TYLENOL 8 HOUR ARTHRITIS PAIN) 650 MG CR tablet Take 650 mg by mouth every 8 (eight) hours as needed for pain.    ALPRAZolam (XANAX) 0.25 MG tablet Take 0.25 mg by mouth at bedtime as needed.  Refills: 2    hydrocortisone (ANUSOL-HC) 25 MG suppository Place 1 suppository (25 mg total) rectally 2 (two) times daily. Qty: 12 suppository, Refills: 1   Associated Diagnoses:  Constipation, unspecified constipation type    PROCTOSOL HC 2.5 % rectal cream       STOP taking these medications     aspirin EC 81 MG tablet      clopidogrel (PLAVIX) 75 MG tablet      oxymetazoline (AFRIN) 0.05 % nasal spray            Management plans discussed with the patient and she is in agreement. Stable for discharge home  Patient should follow up with GI  CODE STATUS:     Code Status Orders        Start     Ordered   12/12/16 0836  Full code  Continuous     12/12/16 0835    Code Status History    Date Active Date Inactive Code Status Order ID Comments User Context   This patient has a current code status but no historical code status.      TOTAL TIME TAKING CARE OF THIS PATIENT: 37 minutes.    Note: This dictation was prepared with Dragon dictation along with smaller phrase technology. Any transcriptional errors that result from this process are unintentional.  Kenzlie Disch M.D on 12/13/2016 at 4:47 PM  Between 7am to 6pm - Pager - [phone] After 6pm go to [email or web address] - password [email or web address] [facility]  Office  [phone]  CC: Primary care physician; Perrin Maltese, MD

## 2016-12-13 NOTE — Anesthesia Preprocedure Evaluation (Signed)
Anesthesia Evaluation  Patient identified by MRN, date of birth, ID band Patient awake    Reviewed: Allergy & Precautions, H&P , NPO status , Patient's Chart, lab work & pertinent test results  History of Anesthesia Complications Negative for: history of anesthetic complications  Airway Mallampati: III  TM Distance: <3 FB Neck ROM: limited    Dental  (+) Poor Dentition, Upper Dentures, Lower Dentures   Pulmonary neg pulmonary ROS, neg shortness of breath, former smoker,    Pulmonary exam normal breath sounds clear to auscultation       Cardiovascular Exercise Tolerance: Poor hypertension, (-) angina+ CAD, + Past MI and +CHF  Normal cardiovascular exam Rhythm:regular Rate:Normal     Neuro/Psych negative neurological ROS  negative psych ROS   GI/Hepatic Neg liver ROS, GERD  Controlled,  Endo/Other  Hypothyroidism   Renal/GU negative Renal ROS  negative genitourinary   Musculoskeletal   Abdominal   Peds  Hematology negative hematology ROS (+)   Anesthesia Other Findings Past Medical History: 2014: Breast cancer ([place])     Comment: Right breast cancer - chemo, radiation and               Mastectomy 2013: CHF (congestive heart failure) ([place]) No date: Clotting disorder ([place])     Comment: blood clots 2012: Heart attack ([place])     Comment: coronary stent x2 completed by Dr. Clayborn Bigness. Apr 29, 2013: Heart attack ([place]) No date: Hemorrhoids No date: Hypertension No date: IBS (irritable bowel syndrome) January 20, 2013.: Malignant neoplasm of lower-inner quadrant of *     Comment: invasive mammary cancer, minimal 0.85 cm.                Histologic grade 1. 2014: Personal history of chemotherapy     Comment: BREAST CA 2014: Personal history of radiation therapy     Comment: BREAST CA No date: Thyroid disease  Past Surgical History: 02-21-2013: BREAST SURGERY Right     Comment: right mastectomy 09/15/2010: COLONOSCOPY W/  BIOPSIES     Comment: Colonoscopy completed by Verdie Shire, M.D. Tubular              adenoma of the ascending colon and descending               colon reported up to 0.4 cm in diameter. No               atypia. 2014: MASTECTOMY Right     Comment: BREAST CA 09/15/2010  : UPPER GI ENDOSCOPY     Comment: Completed by Verdie Shire, M.D. for nausea. Normal               exam reported. No date: VASCULAR SURGERY  BMI    Body Mass Index:  30.04 kg/m      Reproductive/Obstetrics negative OB ROS                             Anesthesia Physical Anesthesia Plan  ASA: III  Anesthesia Plan: General   Post-op Pain Management:    Induction: Intravenous  Airway Management Planned: Natural Airway and Nasal Cannula  Additional Equipment:   Intra-op Plan:   Post-operative Plan:   Informed Consent: I have reviewed the patients History and Physical, chart, labs and discussed the procedure including the risks, benefits and alternatives for the proposed anesthesia with the patient or authorized representative who has indicated his/her understanding and acceptance.   Dental Advisory Given  Plan Discussed with: Anesthesiologist, CRNA and Surgeon  Anesthesia Plan Comments: (Patient consented for risks of anesthesia including but not limited to:  - adverse reactions to medications - damage to teeth, lips or other oral mucosa - sore throat or hoarseness - Damage to heart, brain, lungs or loss of life  Patient voiced understanding.)        Anesthesia Quick Evaluation

## 2016-12-13 NOTE — Anesthesia Procedure Notes (Signed)
Date/Time: 12/13/2016 3:46 PM Performed by: Nelda Marseille Pre-anesthesia Checklist: Patient identified, Emergency Drugs available, Suction available, Patient being monitored and Timeout performed Oxygen Delivery Method: Nasal cannula

## 2016-12-13 NOTE — Anesthesia Postprocedure Evaluation (Signed)
Anesthesia Post Note  Patient: Abigail Ortega  Procedure(s) Performed: Procedure(s) (LRB): ESOPHAGOGASTRODUODENOSCOPY (EGD) (N/A)  Patient location during evaluation: Endoscopy Anesthesia Type: General Level of consciousness: awake and alert Pain management: pain level controlled Vital Signs Assessment: post-procedure vital signs reviewed and stable Respiratory status: spontaneous breathing, nonlabored ventilation, respiratory function stable and patient connected to nasal cannula oxygen Cardiovascular status: blood pressure returned to baseline and stable Postop Assessment: no signs of nausea or vomiting Anesthetic complications: no     Last Vitals:  Vitals:   12/13/16 1616 12/13/16 1626  BP: (!) 123/59 124/76  Pulse: 72 67  Resp: 16 16  Temp:      Last Pain:  Vitals:   12/13/16 1555  TempSrc: Tympanic  PainSc:                  Precious Haws Piscitello

## 2016-12-13 NOTE — Op Note (Signed)
[facility] Gastroenterology Patient Name: Abigail Ortega Procedure Date: 12/13/2016 3:43 PM MRN: [id number] Account #: [id number] Date of Birth: [birth year] Admit Type: Inpatient Age: 66 Room: [facility] ENDO ROOM 1 Gender: Female Note Status: Finalized Procedure:            Upper GI endoscopy Indications:          Hematemesis Providers:            [name] MD, MD Medicines:            Monitored Anesthesia Care Complications:        No immediate complications. Procedure:            Pre-Anesthesia Assessment:                       - Prior to the procedure, a History and Physical was                        performed, and patient medications, allergies and                        sensitivities were reviewed. The patient's tolerance of                        previous anesthesia was reviewed.                       - The risks and benefits of the procedure and the                        sedation options and risks were discussed with the                        patient. All questions were answered and informed                        consent was obtained.                       - ASA Grade Assessment: III - A patient with severe                        systemic disease.                       After obtaining informed consent, the endoscope was                        passed under direct vision. Throughout the procedure,                        the patient's blood pressure, pulse, and oxygen                        saturations were monitored continuously. The Endoscope                        was introduced through the mouth, and advanced to the                        third part of duodenum. The upper GI endoscopy was  accomplished with ease. The patient tolerated the                        procedure well. Findings:      The examined duodenum was normal.      The stomach was normal.      A 5 cm hiatal hernia was present.      The esophagus was normal.  The Z-line was irregular and was found 40 cm from the incisors. Biopsies       were taken with a cold forceps for histology. Impression:           - Normal examined duodenum.                       - Normal stomach.                       - 5 cm hiatal hernia.                       - Normal esophagus.                       - Z-line irregular, 40 cm from the incisors. Biopsied. Recommendation:       - Return patient to hospital ward for possible                        discharge same day.                       - Advance diet as tolerated.                       - Continue present medications.                       - Await pathology results.                       - Return to my office in 6 weeks.                       - Perform a colonoscopy in 2 weeks.                       - I will arrange out patient colonsocopy with [facility]                        and will help get her a sample. Can go home today if                        eating and drinking without vomiting . Procedure Code(s):    --- Professional ---                       [phone], Esophagogastroduodenoscopy, flexible, transoral;                        with biopsy, single or multiple Diagnosis Code(s):    --- Professional ---                       K44.9, Diaphragmatic hernia without obstruction or  gangrene                       K22.8, Other specified diseases of esophagus                       K92.0, Hematemesis CPT copyright 2016 American Medical Association. All rights reserved. The codes documented in this report are preliminary and upon coder review may  be revised to meet current compliance requirements. [name], MD [name] MD, MD 12/13/2016 3:55:07 PM This report has been signed electronically. Number of Addenda: 0 Note Initiated On: 12/13/2016 3:43 PM      [facility]

## 2016-12-13 NOTE — Transfer of Care (Signed)
Immediate Anesthesia Transfer of Care Note  Patient: Abigail Ortega  Procedure(s) Performed: Procedure(s): ESOPHAGOGASTRODUODENOSCOPY (EGD) (N/A)  Patient Location: PACU  Anesthesia Type:General  Level of Consciousness: awake, alert  and oriented  Airway & Oxygen Therapy: Patient Spontanous Breathing and Patient connected to nasal cannula oxygen  Post-op Assessment: Report given to RN and Post -op Vital signs reviewed and stable  Post vital signs: Reviewed and stable  Last Vitals:  Vitals:   12/12/16 2000 12/13/16 1327  BP: (!) 145/77 136/83  Pulse: 78 71  Resp: 18 18  Temp: 37.1 C 36.8 C    Last Pain:  Vitals:   12/13/16 1327  TempSrc: Oral  PainSc:          Complications: No apparent anesthesia complications

## 2016-12-13 NOTE — Progress Notes (Signed)
[place] [facility] [place] NAME: Abigail Ortega    MR#:  [id number]  DATE OF BIRTH:  11/13/1950  SUBJECTIVE:   Patient doing well this morning. Nausea seems to have resolved. Denies abdominal pain or melena. No coffee-ground emesis overnight.  REVIEW OF SYSTEMS:    Review of Systems  Constitutional: Negative for fever, chills weight loss HENT: Negative for ear pain, nosebleeds, congestion, facial swelling, rhinorrhea, neck pain, neck stiffness and ear discharge.   Respiratory: Negative for cough, shortness of breath, wheezing  Cardiovascular: Negative for chest pain, palpitations and leg swelling.  Gastrointestinal: Negative for heartburn, abdominal pain, vomiting, diarrhea or consitpation Genitourinary: Negative for dysuria, urgency, frequency, hematuria Musculoskeletal: Negative for back pain or joint pain Neurological: Negative for dizziness, seizures, syncope, focal weakness,  numbness and headaches.  Hematological: Does not bruise/bleed easily.  Psychiatric/Behavioral: Negative for hallucinations, confusion, dysphoric mood    Tolerating Diet:npo      DRUG ALLERGIES:  No Known Allergies  VITALS:  Blood pressure (!) 145/77, pulse 78, temperature 98.8 F (37.1 C), temperature source Oral, resp. rate 18, height 5\' 4"  (1.626 m), weight 79.4 kg (175 lb), SpO2 100 %.  PHYSICAL EXAMINATION:  Constitutional: Appears well-developed and well-nourished. No distress. HENT: Normocephalic. Marland Kitchen Oropharynx is clear and moist.  Eyes: Conjunctivae and EOM are normal. PERRLA, no scleral icterus.  Neck: Normal ROM. Neck supple. No JVD. No tracheal deviation. CVS: RRR, S1/S2 +, no murmurs, no gallops, no carotid bruit.  Pulmonary: Effort and breath sounds normal, no stridor, rhonchi, wheezes, rales.  Abdominal: Soft. BS +,  no distension, tenderness, rebound or guarding.  Musculoskeletal: Normal range of motion. No edema and no tenderness.  Neuro:  Alert. CN 2-12 grossly intact. No focal deficits. Skin: Skin is warm and dry. No rash noted. Psychiatric: Normal mood and affect.      LABORATORY PANEL:   CBC  Recent Labs Lab 12/13/16 0548  WBC 5.7  HGB 10.8*  HCT 31.1*  PLT 346   ------------------------------------------------------------------------------------------------------------------  Chemistries   Recent Labs Lab 12/12/16 0624  12/13/16 0548  NA 122*  < > 130*  K 2.9*  < > 3.8  CL 83*  < > 99*  CO2 26  < > 25  GLUCOSE 158*  < > 99  BUN 11  < > 8  CREATININE 0.99  < > 0.96  CALCIUM 9.5  < > 9.3  MG 1.1*  --  2.0  AST 32  --   --   ALT 19  --   --   ALKPHOS 113  --   --   BILITOT 0.8  --   --   < > = values in this interval not displayed. ------------------------------------------------------------------------------------------------------------------  Cardiac Enzymes  Recent Labs Lab 12/12/16 0624  TROPONINI <0.03   ------------------------------------------------------------------------------------------------------------------  RADIOLOGY:  No results found.   ASSESSMENT AND PLAN:   66 year old female with a history of chronic systolic heart failure who was scheduled for colonoscopy and endoscopy who presents with coffee-ground emesis.  1. Upper GI bleed: Plan for endoscopy and colonoscopy today  Continue PPI  Follow-up with GI recommendations   2. Nausea and vomiting due to problem #1 This is resolved  3. Hyponatremia from dehydration with nausea and vomiting Sodium level has improved to 1:30  4. Hypokalemia: this is been repleted  magnesium level was initially low however has been corrected and 2.0 today  5. Chronic systolic heart failure without exacerbation Monitor intake and output  6. History of breast cancer      Management plans discussed with the patient and she is in agreement.  CODE STATUS: full  TOTAL TIME TAKING CARE OF THIS PATIENT: 28 minutes.      POSSIBLE D/C today, DEPENDING ON CLINICAL CONDITION.   Saifan Rayford M.D on 12/13/2016 at 8:22 AM  Between 7am to 6pm - Pager - [phone] After 6pm go to [email or web address] - password [email or web address] [facility]  Office  [phone]  CC: Primary care physician; Perrin Maltese, MD  Note: This dictation was prepared with Dragon dictation along with smaller phrase technology. Any transcriptional errors that result from this process are unintentional.

## 2016-12-13 NOTE — Progress Notes (Signed)
Discharge order received. Patient is alert and oriented. Vital signs stable . No signs of acute distress. Discharge instructions given. Per Dr. Vicente Males, patient is to resume taking aspirin and plavix at home.Patient verbalized understanding. No other issues noted at this time.

## 2016-12-13 NOTE — Progress Notes (Signed)
MEDICATION RELATED CONSULT NOTE - INITIAL   Pharmacy Consult for electrolyte management Indication: hypokalemia  No Known Allergies  Patient Measurements: Height: 5\' 4"  (162.6 cm) Weight: 175 lb (79.4 kg) IBW/kg (Calculated) : 54.7 Adjusted Body Weight:   Vital Signs: Temp: 98.8 F (37.1 C) (05/29 2000) Temp Source: Oral (05/29 2000) BP: 145/77 (05/29 2000) Pulse Rate: 78 (05/29 2000) Intake/Output from previous day: 05/29 0701 - 05/30 0700 In: 529.6 [I.V.:529.6] Out: 4600 [Urine:4500; Emesis/NG output:100] Intake/Output from this shift: No intake/output data recorded.  Labs:  Recent Labs  12/12/16 0624  12/12/16 1707 12/12/16 2311 12/13/16 0548  WBC 10.4  --   --   --  5.7  HGB 11.9*  --   --   --  10.8*  HCT 33.4*  --   --   --  31.1*  PLT 353  --   --   --  346  CREATININE 0.99  < > 0.92 0.94 0.96  MG 1.1*  --   --   --  2.0  PHOS 2.8  --   --   --  3.4  ALBUMIN 4.4  --   --   --   --   PROT 7.6  --   --   --   --   AST 32  --   --   --   --   ALT 19  --   --   --   --   ALKPHOS 113  --   --   --   --   BILITOT 0.8  --   --   --   --   < > = values in this interval not displayed. Estimated Creatinine Clearance: 58.8 mL/min (by C-G formula based on SCr of 0.96 mg/dL).   Microbiology: No results found for this or any previous visit (from the past 720 hour(s)).  Medical History: Past Medical History:  Diagnosis Date  . Breast cancer [facility]) 2014   Right breast cancer - chemo, radiation and Mastectomy  . CHF (congestive heart failure) ([place]) 2013  . Clotting disorder ([place])    blood clots  . Heart attack [facility]) 2012   coronary stent x2 completed by Dr. Clayborn Bigness.  Marland Kitchen Heart attack ([place]) Apr 29, 2013  . Hemorrhoids   . Hypertension   . IBS (irritable bowel syndrome)   . Malignant neoplasm of lower-inner quadrant of female breast [facility]) January 20, 2013.   invasive mammary cancer, minimal 0.85 cm.  Histologic grade 1.  . Personal history of chemotherapy 2014   BREAST CA  . Personal history of radiation therapy 2014   BREAST CA  . Thyroid disease     Medications:  Infusions:  . sodium chloride 100 mL/hr at 12/13/16 0104  . pantoprozole (PROTONIX) infusion 8 mg/hr (12/13/16 0654)    Assessment: 32 yof cc hematemesis and hematochezia with NVD. Started vomiting last night after starting bowel prep for colonoscopy this AM. Hypokalemia noted in ED, pharmacy consulted to replace electrolytes. 5/29 1700 K = 3.9  Goal of Therapy:  Electrolytes WNL.  Plan:  Na improved to 130. K 3.8, Mg 2, Phos 3.4. Patient is still NPO. No further supplement warranted at this time. Will recheck electrolytes tomorrow with AM labs.  Tujuana Kilmartin A. [place], [place].D., BCPS Clinical Pharmacist 12/13/2016 7:14 AM

## 2016-12-13 NOTE — Anesthesia Post-op Follow-up Note (Cosign Needed)
Anesthesia QCDR form completed.        

## 2016-12-13 NOTE — Progress Notes (Signed)
[facility], [place] 12/13/16  Subjective:   Patient is doing much better Sodium level has improved to 130 In cheerful spirits when seen earlier  Objective:  Vital signs in last 24 hours:  Temp:  [98.2 F (36.8 C)-98.8 F (37.1 C)] 98.2 F (36.8 C) (05/30 1327) Pulse Rate:  [71-78] 71 (05/30 1327) Resp:  [18] 18 (05/30 1327) BP: (136-145)/(77-83) 136/83 (05/30 1327) SpO2:  [100 %] 100 % (05/30 1327)  Weight change:  Filed Weights   12/12/16 0627  Weight: 79.4 kg (175 lb)    Intake/Output:    Intake/Output Summary (Last 24 hours) at 12/13/16 1547 Last data filed at 12/13/16 1421  Gross per 24 hour  Intake          2356.58 ml  Output             2300 ml  Net            56.58 ml     Physical Exam: General: No acute distress, lying in the bed  HEENT Anicteric, moist oral mucous membranes  Neck supple  Pulm/lungs Normal effort, clear to auscultation  CVS/Heart Regular, no rub or gallop  Abdomen:  Soft, nontender  Extremities: No edema  Neurologic: Alert, oriented  Skin: Normal turgor, no acute rashes          Basic Metabolic Panel:   Recent Labs Lab 12/12/16 0624 12/12/16 1154 12/12/16 1707 12/12/16 2311 12/13/16 0548 12/13/16 1114  NA 122* 122* 124* 124* 130* 133*  K 2.9* 3.9 3.9 4.1 3.8 4.1  CL 83* 87* 88* 90* 99* 101  CO2 26 26 25 24 25 25   GLUCOSE 158* 138* 121* 108* 99 95  BUN 11 8 7 8 8 9   CREATININE 0.99 0.94 0.92 0.94 0.96 1.06*  CALCIUM 9.5 9.3 9.3 9.3 9.3 9.2  MG 1.1*  --   --   --  2.0  --   PHOS 2.8  --   --   --  3.4  --      CBC:  Recent Labs Lab 12/12/16 0624 12/13/16 0548  WBC 10.4 5.7  HGB 11.9* 10.8*  HCT 33.4* 31.1*  MCV 90.7 90.5  PLT 353 346     No results found for: HEPBSAG, HEPBSAB, HEPBIGM    Microbiology:  No results found for this or any previous visit (from the past 240 hour(s)).  Coagulation Studies: No results for input(s): LABPROT, INR in the last 72  hours.  Urinalysis: No results for input(s): COLORURINE, LABSPEC, PHURINE, GLUCOSEU, HGBUR, BILIRUBINUR, KETONESUR, PROTEINUR, UROBILINOGEN, NITRITE, LEUKOCYTESUR in the last 72 hours.  Invalid input(s): APPERANCEUR    Imaging: No results found.   Medications:   . sodium chloride 100 mL/hr at 12/13/16 1038  . pantoprozole (PROTONIX) infusion 8 mg/hr (12/13/16 0654)   . [MAR Hold] hydrocortisone  25 mg Rectal QHS  . [MAR Hold] pantoprazole (PROTONIX) IV  40 mg Intravenous Q12H   [MAR Hold] acetaminophen **OR** [MAR Hold] acetaminophen, [MAR Hold] HYDROcodone-acetaminophen, [MAR Hold]  morphine injection, [MAR Hold] ondansetron **OR** [MAR Hold] ondansetron (ZOFRAN) IV, [MAR Hold] temazepam  Assessment/ Plan:  66 y.o. female with coronary disease with stents, congestive heart failure, mild LVH, Breast cancer in 2015 treated with surgery- rt mastectomy, chemotherapy and radiation, was admitted on 12/12/2016 with nausea, vomiting, coffee-ground emesis after taking colonoscopy prep and was also found to have hyponatremia.   1.  Acute on Chronic hyponatremia - workup so far shows TSH is normal 2-D echo  in February showed EF of 40% with mild LVH No urine studies are available at present   Acute component of hyponatremia, is likely secondary to volume depletion from severe nausea and vomiting It is improved with IV normal saline. Na level today was 133 We will continue to monitor sodium level  D/c normal saline  2.  Hypokalemia Again likely secondary to GI losses Replace PRN    LOS: 1 [facility] 5/30/20183:47 PM  [place], [place]

## 2016-12-13 NOTE — Plan of Care (Signed)
Problem: Fluid Volume: Goal: Ability to maintain a balanced intake and output will improve Outcome: Not Progressing Patient is NPO.  Problem: Nutrition: Goal: Adequate nutrition will be maintained Outcome: Not Progressing Patient is NPO.   

## 2016-12-14 ENCOUNTER — Encounter: Payer: Self-pay | Admitting: Gastroenterology

## 2016-12-15 ENCOUNTER — Telehealth: Payer: Self-pay

## 2016-12-15 LAB — SURGICAL PATHOLOGY

## 2016-12-15 NOTE — Telephone Encounter (Signed)
LVM for patient callback to follow-up on hospital visit per Dr. Vicente Males.   Please arrange colonoscopy when am back from conference- squeeze her in :), can she also have a sample of suprep . She should be discharged in a day or two

## 2016-12-18 ENCOUNTER — Other Ambulatory Visit: Payer: Self-pay

## 2016-12-18 DIAGNOSIS — K2901 Acute gastritis with bleeding: Secondary | ICD-10-CM

## 2016-12-19 ENCOUNTER — Telehealth: Payer: Self-pay | Admitting: Gastroenterology

## 2016-12-19 NOTE — Telephone Encounter (Signed)
12/19/16 [employer] website NO prior auth required for Colonoscopy [id number] Decision ID#: [id number]

## 2016-12-20 NOTE — Discharge Instructions (Signed)
General Anesthesia, Adult, Care After °These instructions provide you with information about caring for yourself after your procedure. Your health care provider may also give you more specific instructions. Your treatment has been planned according to current medical practices, but problems sometimes occur. Call your health care provider if you have any problems or questions after your procedure. °What can I expect after the procedure? °After the procedure, it is common to have: °· Vomiting. °· A sore throat. °· Mental slowness. ° °It is common to feel: °· Nauseous. °· Cold or shivery. °· Sleepy. °· Tired. °· Sore or achy, even in parts of your body where you did not have surgery. ° °Follow these instructions at home: °For at least 24 hours after the procedure: °· Do not: °? Participate in activities where you could fall or become injured. °? Drive. °? Use heavy machinery. °? Drink alcohol. °? Take sleeping pills or medicines that cause drowsiness. °? Make important decisions or sign legal documents. °? Take care of children on your own. °· Rest. °Eating and drinking °· If you vomit, drink water, juice, or soup when you can drink without vomiting. °· Drink enough fluid to keep your urine clear or pale yellow. °· Make sure you have little or no nausea before eating solid foods. °· Follow the diet recommended by your health care provider. °General instructions °· Have a responsible adult stay with you until you are awake and alert. °· Return to your normal activities as told by your health care provider. Ask your health care provider what activities are safe for you. °· Take over-the-counter and prescription medicines only as told by your health care provider. °· If you smoke, do not smoke without supervision. °· Keep all follow-up visits as told by your health care provider. This is important. °Contact a health care provider if: °· You continue to have nausea or vomiting at home, and medicines are not helpful. °· You  cannot drink fluids or start eating again. °· You cannot urinate after 8-12 hours. °· You develop a skin rash. °· You have fever. °· You have increasing redness at the site of your procedure. °Get help right away if: °· You have difficulty breathing. °· You have chest pain. °· You have unexpected bleeding. °· You feel that you are having a life-threatening or urgent problem. °This information is not intended to replace advice given to you by your health care provider. Make sure you discuss any questions you have with your health care provider. °Document Released: 10/09/2000 Document Revised: 12/06/2015 Document Reviewed: 06/17/2015 °Elsevier Interactive Patient Education © 2018 [employer] Inc. ° °

## 2016-12-21 ENCOUNTER — Ambulatory Visit
Admission: RE | Admit: 2016-12-21 | Discharge: 2016-12-21 | Disposition: A | Discharge status: Home / Self Care | Payer: Medicare Other | Source: Ambulatory Visit | Attending: Gastroenterology | Admitting: Gastroenterology

## 2016-12-21 ENCOUNTER — Other Ambulatory Visit: Payer: Self-pay

## 2016-12-21 ENCOUNTER — Inpatient Hospital Stay
Admission: AD | Admit: 2016-12-21 | Discharge: 2016-12-23 | DRG: 641 | Disposition: A | Discharge status: Home / Self Care | Payer: Medicare Other | Source: Ambulatory Visit | Attending: Internal Medicine | Admitting: Internal Medicine

## 2016-12-21 ENCOUNTER — Encounter
Admission: RE | Disposition: A | Discharge status: Home / Self Care | Payer: Self-pay | Source: Ambulatory Visit | Attending: Gastroenterology

## 2016-12-21 DIAGNOSIS — N183 Chronic kidney disease, stage 3 (moderate): Secondary | ICD-10-CM | POA: Diagnosis present

## 2016-12-21 DIAGNOSIS — Z860100 Personal history of colon polyps, unspecified: Secondary | ICD-10-CM

## 2016-12-21 DIAGNOSIS — D120 Benign neoplasm of cecum: Secondary | ICD-10-CM | POA: Diagnosis present

## 2016-12-21 DIAGNOSIS — F329 Major depressive disorder, single episode, unspecified: Secondary | ICD-10-CM | POA: Diagnosis present

## 2016-12-21 DIAGNOSIS — Z792 Long term (current) use of antibiotics: Secondary | ICD-10-CM

## 2016-12-21 DIAGNOSIS — M1990 Unspecified osteoarthritis, unspecified site: Secondary | ICD-10-CM | POA: Diagnosis present

## 2016-12-21 DIAGNOSIS — I509 Heart failure, unspecified: Secondary | ICD-10-CM | POA: Diagnosis present

## 2016-12-21 DIAGNOSIS — I252 Old myocardial infarction: Secondary | ICD-10-CM | POA: Diagnosis not present

## 2016-12-21 DIAGNOSIS — E861 Hypovolemia: Secondary | ICD-10-CM | POA: Diagnosis present

## 2016-12-21 DIAGNOSIS — Z79899 Other long term (current) drug therapy: Secondary | ICD-10-CM

## 2016-12-21 DIAGNOSIS — I130 Hypertensive heart and chronic kidney disease with heart failure and stage 1 through stage 4 chronic kidney disease, or unspecified chronic kidney disease: Secondary | ICD-10-CM | POA: Diagnosis present

## 2016-12-21 DIAGNOSIS — I2510 Atherosclerotic heart disease of native coronary artery without angina pectoris: Secondary | ICD-10-CM | POA: Diagnosis present

## 2016-12-21 DIAGNOSIS — E871 Hypo-osmolality and hyponatremia: Secondary | ICD-10-CM | POA: Diagnosis present

## 2016-12-21 DIAGNOSIS — Z791 Long term (current) use of non-steroidal anti-inflammatories (NSAID): Secondary | ICD-10-CM

## 2016-12-21 DIAGNOSIS — E875 Hyperkalemia: Secondary | ICD-10-CM | POA: Diagnosis present

## 2016-12-21 DIAGNOSIS — K640 First degree hemorrhoids: Secondary | ICD-10-CM | POA: Diagnosis present

## 2016-12-21 DIAGNOSIS — E785 Hyperlipidemia, unspecified: Secondary | ICD-10-CM | POA: Diagnosis present

## 2016-12-21 DIAGNOSIS — Z8719 Personal history of other diseases of the digestive system: Secondary | ICD-10-CM

## 2016-12-21 DIAGNOSIS — K449 Diaphragmatic hernia without obstruction or gangrene: Secondary | ICD-10-CM | POA: Diagnosis present

## 2016-12-21 DIAGNOSIS — E860 Dehydration: Secondary | ICD-10-CM | POA: Diagnosis present

## 2016-12-21 DIAGNOSIS — T39395A Adverse effect of other nonsteroidal anti-inflammatory drugs [NSAID], initial encounter: Secondary | ICD-10-CM | POA: Diagnosis present

## 2016-12-21 DIAGNOSIS — Z923 Personal history of irradiation: Secondary | ICD-10-CM

## 2016-12-21 DIAGNOSIS — F419 Anxiety disorder, unspecified: Secondary | ICD-10-CM | POA: Diagnosis present

## 2016-12-21 DIAGNOSIS — K5730 Diverticulosis of large intestine without perforation or abscess without bleeding: Secondary | ICD-10-CM | POA: Diagnosis present

## 2016-12-21 DIAGNOSIS — K6289 Other specified diseases of anus and rectum: Secondary | ICD-10-CM | POA: Diagnosis not present

## 2016-12-21 DIAGNOSIS — Z7952 Long term (current) use of systemic steroids: Secondary | ICD-10-CM

## 2016-12-21 DIAGNOSIS — R112 Nausea with vomiting, unspecified: Secondary | ICD-10-CM | POA: Diagnosis present

## 2016-12-21 DIAGNOSIS — Z87891 Personal history of nicotine dependence: Secondary | ICD-10-CM

## 2016-12-21 DIAGNOSIS — Z7902 Long term (current) use of antithrombotics/antiplatelets: Secondary | ICD-10-CM

## 2016-12-21 DIAGNOSIS — Z9221 Personal history of antineoplastic chemotherapy: Secondary | ICD-10-CM | POA: Diagnosis not present

## 2016-12-21 DIAGNOSIS — E876 Hypokalemia: Secondary | ICD-10-CM | POA: Diagnosis present

## 2016-12-21 DIAGNOSIS — K644 Residual hemorrhoidal skin tags: Secondary | ICD-10-CM | POA: Diagnosis present

## 2016-12-21 DIAGNOSIS — Z955 Presence of coronary angioplasty implant and graft: Secondary | ICD-10-CM

## 2016-12-21 HISTORY — DX: Unspecified osteoarthritis, unspecified site: M19.90

## 2016-12-21 HISTORY — DX: Gastrointestinal hemorrhage, unspecified: K92.2

## 2016-12-21 LAB — BASIC METABOLIC PANEL
ANION GAP: 11 (ref 5–15)
ANION GAP: 7 (ref 5–15)
Anion gap: 12 (ref 5–15)
Anion gap: 10 (ref 5–15)
BUN: 8 mg/dL (ref 6–20)
BUN: 7 mg/dL (ref 6–20)
BUN: 7 mg/dL (ref 6–20)
BUN: 7 mg/dL (ref 6–20)
CALCIUM: 9.8 mg/dL (ref 8.9–10.3)
CALCIUM: 9.7 mg/dL (ref 8.9–10.3)
CALCIUM: 9.5 mg/dL (ref 8.9–10.3)
CALCIUM: 8.9 mg/dL (ref 8.9–10.3)
CHLORIDE: 86 mmol/L — AB (ref 101–111)
CHLORIDE: 92 mmol/L — AB (ref 101–111)
CHLORIDE: 98 mmol/L — AB (ref 101–111)
CO2: 26 mmol/L (ref 22–32)
CO2: 27 mmol/L (ref 22–32)
CO2: 24 mmol/L (ref 22–32)
CO2: 24 mmol/L (ref 22–32)
CREATININE: 1 mg/dL (ref 0.44–1.00)
CREATININE: 1.06 mg/dL — AB (ref 0.44–1.00)
Chloride: 84 mmol/L — ABNORMAL LOW (ref 101–111)
Creatinine, Ser: 1.05 mg/dL — ABNORMAL HIGH (ref 0.44–1.00)
Creatinine, Ser: 1.13 mg/dL — ABNORMAL HIGH (ref 0.44–1.00)
GFR calc Af Amer: >60 mL/min (ref >60)
GFR calc non Af Amer: 54 mL/min — ABNORMAL LOW (ref >60)
GFR calc non Af Amer: 57 mL/min — ABNORMAL LOW (ref >60)
GFR calc non Af Amer: 53 mL/min — ABNORMAL LOW (ref >60)
GFR calc non Af Amer: 50 mL/min — ABNORMAL LOW (ref >60)
GFR, EST AFRICAN AMERICAN: 57 mL/min — AB (ref >60)
Glucose, Bld: 128 mg/dL — ABNORMAL HIGH (ref 65–99)
Glucose, Bld: 124 mg/dL — ABNORMAL HIGH (ref 65–99)
Glucose, Bld: 108 mg/dL — ABNORMAL HIGH (ref 65–99)
Glucose, Bld: 101 mg/dL — ABNORMAL HIGH (ref 65–99)
Potassium: 3.4 mmol/L — ABNORMAL LOW (ref 3.5–5.1)
Potassium: 4.1 mmol/L (ref 3.5–5.1)
Potassium: 4.1 mmol/L (ref 3.5–5.1)
Potassium: 4.1 mmol/L (ref 3.5–5.1)
Sodium: 121 mmol/L — ABNORMAL LOW (ref 135–145)
Sodium: 125 mmol/L — ABNORMAL LOW (ref 135–145)
Sodium: 126 mmol/L — ABNORMAL LOW (ref 135–145)
Sodium: 129 mmol/L — ABNORMAL LOW (ref 135–145)

## 2016-12-21 LAB — CBC
HCT: 34.3 % — ABNORMAL LOW (ref 35.0–47.0)
HEMOGLOBIN: 12 g/dL (ref 12.0–16.0)
MCH: 31.7 pg (ref 26.0–34.0)
MCHC: 35 g/dL (ref 32.0–36.0)
MCV: 90.5 fL (ref 80.0–100.0)
PLATELETS: 379 10*3/uL (ref 150–440)
RBC: 3.79 MIL/uL — AB (ref 3.80–5.20)
RDW: 12.4 % (ref 11.5–14.5)
WBC: 9.6 10*3/uL (ref 3.6–11.0)

## 2016-12-21 LAB — SODIUM, URINE, RANDOM: SODIUM UR: 57 mmol/L

## 2016-12-21 LAB — OSMOLALITY, URINE: Osmolality, Ur: 166 mOsm/kg — ABNORMAL LOW (ref 300–900)

## 2016-12-21 LAB — GLUCOSE, CAPILLARY: GLUCOSE-CAPILLARY: 126 mg/dL — AB (ref 65–99)

## 2016-12-21 LAB — OSMOLALITY: Osmolality: 258 mOsm/kg — ABNORMAL LOW (ref 275–295)

## 2016-12-21 SURGERY — COLONOSCOPY WITH PROPOFOL
Anesthesia: Choice

## 2016-12-21 MED ORDER — HYDROCORTISONE ACETATE 25 MG RE SUPP
25.0000 mg | Freq: Two times a day (BID) | RECTAL | Status: DC
  Administered 2016-12-21 – 2016-12-23 (×4): 25 mg via RECTAL
  Filled 2016-12-21 (×5): qty 1

## 2016-12-21 MED ORDER — HYDROCORTISONE 2.5 % RE CREA
TOPICAL_CREAM | Freq: Every evening | RECTAL | Status: DC | PRN
  Filled 2016-12-21: qty 28.35

## 2016-12-21 MED ORDER — TRAZODONE HCL 50 MG PO TABS
50.0000 mg | ORAL_TABLET | Freq: Every day | ORAL | Status: DC
  Administered 2016-12-21 – 2016-12-22 (×2): 50 mg via ORAL
  Filled 2016-12-21 (×2): qty 1

## 2016-12-21 MED ORDER — SODIUM CHLORIDE 0.9 % IV BOLUS (SEPSIS)
500.0000 mL | Freq: Once | INTRAVENOUS | Status: AC
  Administered 2016-12-21: 500 mL via INTRAVENOUS

## 2016-12-21 MED ORDER — PREDNISONE 20 MG PO TABS
20.0000 mg | ORAL_TABLET | Freq: Every day | ORAL | Status: DC
  Administered 2016-12-22 – 2016-12-23 (×2): 20 mg via ORAL
  Filled 2016-12-21 (×2): qty 1

## 2016-12-21 MED ORDER — ERYTHROMYCIN 5 MG/GM OP OINT
TOPICAL_OINTMENT | Freq: Four times a day (QID) | OPHTHALMIC | Status: DC
  Administered 2016-12-21 – 2016-12-22 (×2): 1 via OPHTHALMIC
  Administered 2016-12-22: 06:00:00 via OPHTHALMIC
  Filled 2016-12-21: qty 3.5

## 2016-12-21 MED ORDER — ENOXAPARIN SODIUM 40 MG/0.4ML ~~LOC~~ SOLN
40.0000 mg | SUBCUTANEOUS | Status: DC
  Administered 2016-12-21 – 2016-12-22 (×2): 40 mg via SUBCUTANEOUS
  Filled 2016-12-21 (×2): qty 0.4

## 2016-12-21 MED ORDER — TEMAZEPAM 15 MG PO CAPS
30.0000 mg | ORAL_CAPSULE | Freq: Every day | ORAL | Status: DC
  Administered 2016-12-21 – 2016-12-22 (×2): 30 mg via ORAL
  Filled 2016-12-21 (×2): qty 2

## 2016-12-21 MED ORDER — ATORVASTATIN CALCIUM 20 MG PO TABS
40.0000 mg | ORAL_TABLET | Freq: Every day | ORAL | Status: DC
  Administered 2016-12-21 – 2016-12-23 (×3): 40 mg via ORAL
  Filled 2016-12-21 (×3): qty 2

## 2016-12-21 MED ORDER — VENLAFAXINE HCL ER 75 MG PO CP24
150.0000 mg | ORAL_CAPSULE | Freq: Every day | ORAL | Status: DC
  Administered 2016-12-22: 150 mg via ORAL
  Filled 2016-12-21 (×3): qty 2

## 2016-12-21 MED ORDER — METOCLOPRAMIDE HCL 5 MG/ML IJ SOLN
5.0000 mg | Freq: Once | INTRAMUSCULAR | Status: AC
  Administered 2016-12-21: 5 mg via INTRAVENOUS
  Filled 2016-12-21: qty 2

## 2016-12-21 MED ORDER — CALCIUM CITRATE-VITAMIN D 500-500 MG-UNIT PO CHEW
1.0000 | CHEWABLE_TABLET | Freq: Every day | ORAL | Status: DC
  Administered 2016-12-22: 1 via ORAL
  Filled 2016-12-21 (×3): qty 1

## 2016-12-21 MED ORDER — ACETAMINOPHEN 325 MG PO TABS
650.0000 mg | ORAL_TABLET | Freq: Four times a day (QID) | ORAL | Status: DC | PRN
  Administered 2016-12-21 – 2016-12-23 (×4): 650 mg via ORAL
  Filled 2016-12-21 (×4): qty 2

## 2016-12-21 MED ORDER — FLUTICASONE PROPIONATE 50 MCG/ACT NA SUSP
1.0000 | Freq: Every day | NASAL | Status: DC
  Filled 2016-12-21: qty 16

## 2016-12-21 MED ORDER — ONDANSETRON HCL 4 MG PO TABS
4.0000 mg | ORAL_TABLET | Freq: Four times a day (QID) | ORAL | Status: DC | PRN
  Administered 2016-12-22: 4 mg via ORAL
  Filled 2016-12-21: qty 1

## 2016-12-21 MED ORDER — CYCLOBENZAPRINE HCL 10 MG PO TABS
10.0000 mg | ORAL_TABLET | Freq: Three times a day (TID) | ORAL | Status: DC | PRN
  Administered 2016-12-21: 10 mg via ORAL
  Filled 2016-12-21: qty 1

## 2016-12-21 MED ORDER — MELOXICAM 7.5 MG PO TABS
15.0000 mg | ORAL_TABLET | Freq: Every day | ORAL | Status: DC
  Administered 2016-12-21 – 2016-12-22 (×2): 15 mg via ORAL
  Filled 2016-12-21 (×2): qty 2

## 2016-12-21 MED ORDER — ONDANSETRON HCL 4 MG/2ML IJ SOLN
4.0000 mg | Freq: Four times a day (QID) | INTRAMUSCULAR | Status: DC | PRN
  Administered 2016-12-21 – 2016-12-22 (×4): 4 mg via INTRAVENOUS
  Filled 2016-12-21 (×4): qty 2

## 2016-12-21 MED ORDER — FERROUS SULFATE 325 (65 FE) MG PO TABS
325.0000 mg | ORAL_TABLET | Freq: Every morning | ORAL | Status: DC
  Filled 2016-12-21: qty 1

## 2016-12-21 MED ORDER — LETROZOLE 2.5 MG PO TABS
2.5000 mg | ORAL_TABLET | Freq: Every day | ORAL | Status: DC
  Administered 2016-12-21 – 2016-12-23 (×3): 2.5 mg via ORAL
  Filled 2016-12-21 (×4): qty 1

## 2016-12-21 MED ORDER — POTASSIUM CHLORIDE IN NACL 20-0.9 MEQ/L-% IV SOLN
INTRAVENOUS | Status: DC
  Administered 2016-12-21 – 2016-12-22 (×2): via INTRAVENOUS
  Filled 2016-12-21 (×2): qty 1000

## 2016-12-21 MED ORDER — PANTOPRAZOLE SODIUM 40 MG PO TBEC
40.0000 mg | DELAYED_RELEASE_TABLET | Freq: Two times a day (BID) | ORAL | Status: DC
  Administered 2016-12-21 – 2016-12-23 (×5): 40 mg via ORAL
  Filled 2016-12-21 (×5): qty 1

## 2016-12-21 MED ORDER — MONTELUKAST SODIUM 10 MG PO TABS
10.0000 mg | ORAL_TABLET | Freq: Every day | ORAL | Status: DC
  Administered 2016-12-21 – 2016-12-22 (×2): 10 mg via ORAL
  Filled 2016-12-21 (×2): qty 1

## 2016-12-21 MED ORDER — RAMIPRIL 5 MG PO CAPS
5.0000 mg | ORAL_CAPSULE | Freq: Every day | ORAL | Status: DC
  Administered 2016-12-21 – 2016-12-22 (×2): 5 mg via ORAL
  Filled 2016-12-21 (×3): qty 1

## 2016-12-21 MED ORDER — AMLODIPINE BESYLATE 10 MG PO TABS
10.0000 mg | ORAL_TABLET | Freq: Every day | ORAL | Status: DC
  Administered 2016-12-21: 10 mg via ORAL
  Filled 2016-12-21: qty 1

## 2016-12-21 MED ORDER — LACTATED RINGERS IV SOLN: INTRAVENOUS | Status: DC

## 2016-12-21 MED ORDER — LEVOTHYROXINE SODIUM 75 MCG PO TABS
75.0000 ug | ORAL_TABLET | Freq: Every day | ORAL | Status: DC
  Administered 2016-12-23: 75 ug via ORAL
  Filled 2016-12-21: qty 1

## 2016-12-21 MED ORDER — ONDANSETRON HCL 4 MG/2ML IJ SOLN
4.0000 mg | Freq: Once | INTRAMUSCULAR | Status: AC
  Administered 2016-12-21: 4 mg via INTRAVENOUS

## 2016-12-21 MED ORDER — ALPRAZOLAM 0.25 MG PO TABS: 0.2500 mg | ORAL_TABLET | Freq: Every evening | ORAL | Status: DC | PRN

## 2016-12-21 MED ORDER — HYDROXYZINE HCL 25 MG PO TABS: 25.0000 mg | ORAL_TABLET | Freq: Three times a day (TID) | ORAL | Status: DC | PRN

## 2016-12-21 MED ORDER — HYDROCOD POLST-CPM POLST ER 10-8 MG/5ML PO SUER: 5.0000 mL | Freq: Four times a day (QID) | ORAL | Status: DC | PRN

## 2016-12-21 MED ORDER — GABAPENTIN 100 MG PO CAPS
100.0000 mg | ORAL_CAPSULE | Freq: Three times a day (TID) | ORAL | Status: DC
  Administered 2016-12-21 – 2016-12-22 (×3): 100 mg via ORAL
  Filled 2016-12-21 (×3): qty 1

## 2016-12-21 SURGICAL SUPPLY — 23 items

## 2016-12-21 NOTE — Consult Note (Addendum)
[name] MD, MRCP(U.K) [address]  [place]  [place], [place] [address]  Main: [phone]  Fax: [phone]  Consultation  Referring Provider:     Dr Margaretmary Eddy Primary Care Physician:  Perrin Maltese, MD Primary Gastroenterologist:  Dr. Vicente Males         Reason for Consultation:     Colonoscopy   Date of Admission:  12/21/2016 Date of Consultation:  12/21/2016         HPI:   Abigail Ortega is a 66 y.o. female who is known to me in the outpatient .    Summary of history :  I had initially seen her for evaluation of nausea ongoing for afew years. She has a family history of colon cancer and personal history of colon polyps. Last colonoscopy was back in 2012 and she was suggested a repeat in 3 years time.At her last office visit with me on 11/02/16 my plan was for an EGD to evaluate the nausea as well as a surveillance colonoscopy. I also felt she had a fissure due to her anal pain and gave her a course of Anusol.She was on plavix. At that time she also mentioned unintentional weight loss of 20 lbs.   She was initially scheduled to have the procedures on 12/12/16 but due to extreme nausea/vomiting and coffee ground emesis was brought into the ER, found to have severe hyponatremia likely from dehydration. During the same hospitalization I performed an EGD on 12/13/16 which revealed a large 5 cm hiatal hernia , irregular z line  . Plan was for an outpatient colonoscopy . She was scheduled to have a colonoscopy today with Dr Allen Norris at [facility] and she came in did not look well and was advised to get admitted after she was found to have a very low sodium @ 125 , low chloride. Her plavix has been held for 7 days for the procedure. She says she was having a lot of vomiting after starting the bowel prep yesterday , now no vomiting   Past Medical History:  Diagnosis Date  . Arthritis   . Breast cancer [facility]) 2014   Right breast cancer - chemo, radiation and Mastectomy  . CHF (congestive heart  failure) ([place]) 2013  . Clotting disorder ([place])    blood clots  . GI bleed   . Heart attack [facility]) 2012   coronary stent x2 completed by Dr. Clayborn Bigness.  Marland Kitchen Heart attack ([place]) Apr 29, 2013  . Hemorrhoids   . Hypertension   . IBS (irritable bowel syndrome)   . Malignant neoplasm of lower-inner quadrant of female breast [facility]) January 20, 2013.   invasive mammary cancer, minimal 0.85 cm.  Histologic grade 1.  . Personal history of chemotherapy 2014   BREAST CA  . Personal history of radiation therapy 2014   BREAST CA  . Thyroid disease     Past Surgical History:  Procedure Laterality Date  . BREAST SURGERY Right 02-21-2013   right mastectomy  . CARDIAC CATHETERIZATION    . COLONOSCOPY W/ BIOPSIES  09/15/2010   Colonoscopy completed by Verdie Shire, M.D. Tubular adenoma of the ascending colon and descending colon reported up to 0.4 cm in diameter. No atypia.  Marland Kitchen ESOPHAGOGASTRODUODENOSCOPY N/A 12/13/2016   Procedure: ESOPHAGOGASTRODUODENOSCOPY (EGD);  Surgeon: [name], MD;  Location[facility] ENDOSCOPY;  Service: Endoscopy;  Laterality: N/A;  . MASTECTOMY Right 2014   BREAST CA  . UPPER GI ENDOSCOPY  09/15/2010     Completed by Verdie Shire,  M.D. for nausea. Normal exam reported.  Marland Kitchen VASCULAR SURGERY      Prior to Admission medications   Medication Sig Start Date End Date Taking? Authorizing Provider  acetaminophen (TYLENOL 8 HOUR ARTHRITIS PAIN) 650 MG CR tablet Take 650 mg by mouth every 8 (eight) hours as needed for pain.    [provider]  ALPRAZolam Duanne Moron) 0.25 MG tablet Take 0.25 mg by mouth at bedtime as needed.  04/23/15   [provider]  amLODipine (NORVASC) 10 MG tablet Take 10 mg by mouth daily.    [provider]  atorvastatin (LIPITOR) 40 MG tablet Take 1 tablet by mouth once daily 06/08/15   [provider]  Calcium 600-200 MG-UNIT tablet Take 1 tablet by mouth daily.    [provider]  chlorpheniramine-HYDROcodone (TUSSIONEX) 10-8 MG/5ML  SUER hydrocodone 10 mg-chlorpheniramine 8 mg/5 mL oral susp extend.rel 12hr  TAKE 5 ML BY MOUTH NIGHTLY AS NEEDED    [provider]  clopidogrel (PLAVIX) 75 MG tablet clopidogrel 75 mg tablet    [provider]  cyclobenzaprine (FLEXERIL) 10 MG tablet cyclobenzaprine 10 mg tablet    [provider]  doxycycline (VIBRA-TABS) 100 MG tablet doxycycline hyclate 100 mg tablet    [provider]  erythromycin ophthalmic ointment erythromycin 5 mg/gram (0.5 %) eye ointment  PLACE 1 CM RIBBON IN EYES 6 TIMES A DAY X 7-10 DAYS    [provider]  Ferrous Sulfate (IRON) 325 (65 Fe) MG TABS Take by mouth.    [provider]  fluticasone Asencion Islam) 50 MCG/ACT nasal spray  09/11/16   [provider]  gabapentin (NEURONTIN) 100 MG capsule gabapentin 100 mg capsule    [provider]  HYDROcodone-homatropine (HYCODAN) 5-1.5 MG/5ML syrup hydrocodone-homatropine 5 mg-1.5 mg/5 mL syrup  5-10MLS BY MOUTH AT BEDTIME AS NEEDED    [provider]  hydrocortisone (ANUSOL-HC) 25 MG suppository Place 1 suppository (25 mg total) rectally 2 (two) times daily. 11/16/16   [name], MD  hydrOXYzine (ATARAX/VISTARIL) 25 MG tablet hydroxyzine HCl 25 mg tablet    [provider]  letrozole ([place]) 2.5 MG tablet TAKE 1 TABLET BY MOUTH  DAILY 08/06/16   Lloyd Huger, MD  levofloxacin (LEVAQUIN) 500 MG tablet levofloxacin 500 mg tablet    [provider]  levothyroxine (SYNTHROID, LEVOTHROID) 75 MCG tablet Take 75 mcg by mouth daily before breakfast.    [provider]  meloxicam (MOBIC) 15 MG tablet meloxicam 15 mg tablet  TAKE 1 TABLET(S) EVERY DAY BY ORAL ROUTE WITH MEALS.    [provider]  montelukast (SINGULAIR) 10 MG tablet Take 10 mg by mouth at bedtime.    [provider]  ondansetron (ZOFRAN) 4 MG tablet  10/09/16   [provider]  pantoprazole (PROTONIX) 40 MG tablet Take 1  tablet (40 mg total) by mouth 2 (two) times daily. 12/13/16   Bettey Costa, MD  predniSONE (DELTASONE) 20 MG tablet prednisone 20 mg tablet    [provider]  PROCTOSOL HC 2.5 % rectal cream  09/08/16   [provider]  ramipril (ALTACE) 5 MG capsule ramipril 5 mg capsule    [provider]  temazepam (RESTORIL) 30 MG capsule Take 30 mg by mouth at bedtime. 11/15/16   [provider]  traZODone (DESYREL) 50 MG tablet at bedtime.  10/05/14   [provider]  venlafaxine XR (EFFEXOR-XR) 150 MG 24 hr capsule venlafaxine ER 150 mg capsule,extended release 24 hr  [provider]  venlafaxine XR (EFFEXOR-XR) 37.5 MG 24 hr capsule venlafaxine ER 37.5 mg capsule,extended release 24 hr    [provider]  venlafaxine XR (EFFEXOR-XR) 75 MG 24 hr capsule  11/14/16   [provider]    Family History  Problem Relation Age of Onset  . Cancer Mother 19       breast  . Breast cancer Mother 25  . Cancer Father        colon     Social History  Substance Use Topics  . Smoking status: Former Smoker    Years: 20.00  . Smokeless tobacco: Never Used  . Alcohol use No    Allergies as of 12/21/2016  . (No Known Allergies)    Review of Systems:    All systems reviewed and negative except where noted in HPI.   Physical Exam:  Vital signs in last 24 hours: Temp:  [97.7 F (36.5 C)-98.6 F (37 C)] 98.6 F (37 C) (06/07 1105) Pulse Rate:  [70-79] 75 (06/07 1237) Resp:  [18] 18 (06/07 1021) BP: (132-135)/(65-90) 135/76 (06/07 1237) SpO2:  [100 %] 100 % (06/07 1105) Weight:  [173 lb 11.2 oz (78.8 kg)-176 lb (79.8 kg)] 173 lb 11.2 oz (78.8 kg) (06/07 1100)   General:   Pleasant, cooperative in NAD Head:  Normocephalic and atraumatic. Eyes:   No icterus.   Conjunctiva pink. PERRLA. Ears:  Normal auditory acuity. Neck:  Supple; no masses or thyroidomegaly Lungs: Respirations even and unlabored. Lungs clear to auscultation  bilaterally.   No wheezes, crackles, or rhonchi.  Heart:  Regular rate and rhythm;  Without murmur, clicks, rubs or gallops Abdomen:  Soft, nondistended, nontender. Normal bowel sounds. No appreciable masses or hepatomegaly.  No rebound or guarding.  Extremities:  Without edema, cyanosis or clubbing. Neurologic:  Alert and oriented x3;  grossly normal neurologically. Skin:  Intact without significant lesions or rashes. Cervical Nodes:  No significant cervical adenopathy. Psych:  Alert and cooperative. Normal affect.  LAB RESULTS:  Recent Labs  12/21/16 0750  WBC 9.6  HGB 12.0  HCT 34.3*  PLT 379   BMET  Recent Labs  12/21/16 0750 12/21/16 1158  NA 121* 125*  K 3.4* 4.1  CL 84* 86*  CO2 26 27  GLUCOSE 128* 124*  BUN 8 7  CREATININE 1.05* 1.00  CALCIUM 9.8 9.7   LFT No results for input(s): PROT, ALBUMIN, AST, ALT, ALKPHOS, BILITOT, BILIDIR, IBILI in the last 72 hours. PT/INR No results for input(s): LABPROT, INR in the last 72 hours.  STUDIES: No results found.    Impression / Plan:   HALAH WHITESIDE is a 66 y.o. y/o female admitted today for hyponatremia likely secondary to dehydration from the bowel prep caused by vomiting (resolved now). She was scheduled for an outpatient colonoscopy today to evaluate for anal pain and surveillance due to prior history of polyps - this is the second attempt with similar presentation .  Plan   1. Replete electrolytes then plan for coloscopy as an inpatient  Likely on Saturday . Would suggest a dose of IV zofran prior to commencing a reduced qty of bowel prep night before procedure  2. Anusol supp for anal pain/burning     Thank you for involving me in the care of this patient.      LOS: 0 days   [name], MD  12/21/2016, 2:07 PM

## 2016-12-21 NOTE — Anesthesia Preprocedure Evaluation (Deleted)
Anesthesia Evaluation  Patient identified by MRN, date of birth, ID band Patient awake    Reviewed: Allergy & Precautions, H&P , NPO status , Patient's Chart, lab work & pertinent test results  Airway Mallampati: II  TM Distance: >3 FB Neck ROM: full    Dental no notable dental hx.    Pulmonary former smoker,    Pulmonary exam normal        Cardiovascular hypertension, + CAD, + Past MI and +CHF  Normal cardiovascular exam     Neuro/Psych PSYCHIATRIC DISORDERS Anxiety    GI/Hepatic   Endo/Other  Hypothyroidism   Renal/GU      Musculoskeletal   Abdominal   Peds  Hematology   Anesthesia Other Findings   Reproductive/Obstetrics                             Anesthesia Physical Anesthesia Plan  ASA: III  Anesthesia Plan: General   Post-op Pain Management:    Induction:   PONV Risk Score and Plan: 3 and Propofol  Airway Management Planned:   Additional Equipment:   Intra-op Plan:   Post-operative Plan:   Informed Consent: I have reviewed the patients History and Physical, chart, labs and discussed the procedure including the risks, benefits and alternatives for the proposed anesthesia with the patient or authorized representative who has indicated his/her understanding and acceptance.     Plan Discussed with:   Anesthesia Plan Comments: (Na 121.  Sent to [facility] for tx, and inpatient colonoscopy f/u)      Anesthesia Quick Evaluation

## 2016-12-21 NOTE — Progress Notes (Signed)
Pt's BP 92/50, c/o of mild "sinus" headache and slight dizziness when OOB. Pt also continues with ongoing nausea, no emesis. Short term relief with zofran. Dr. Ara Kussmaul made aware, new orders received, will monitor

## 2016-12-21 NOTE — H&P (Signed)
[place] at [place] NAME: Abigail Ortega    MR#:  [id number]  DATE OF BIRTH:  May 22, 1951  DATE OF ADMISSION:  12/21/2016  PRIMARY CARE PHYSICIAN: Perrin Maltese, MD   REQUESTING/REFERRING PHYSICIAN: dr.Anna  CHIEF COMPLAINT:  Nausea, vomiting and low sodium  HISTORY OF PRESENT ILLNESS:  Abigail Ortega  is a 66 y.o. female with a known history of Congestive heart failure, coronary artery disease, hypertension, hyperlipidemia came to the hospital for outpatient colonoscopy but the procedure was canceled as patient was nauseous following bowel prep and her sodium was down to 121. Hospitalist team is called to admit the patient as a direct admission. Patient denies any blood in her stool, reporting abdominal discomfort. Mentating fine. Plavix is on hold for the past 7 days for colonoscopy  PAST MEDICAL HISTORY:   Past Medical History:  Diagnosis Date  . Arthritis   . Breast cancer [facility]) 2014   Right breast cancer - chemo, radiation and Mastectomy  . CHF (congestive heart failure) ([place]) 2013  . Clotting disorder ([place])    blood clots  . GI bleed   . Heart attack [facility]) 2012   coronary stent x2 completed by Dr. Clayborn Bigness.  Marland Kitchen Heart attack ([place]) Apr 29, 2013  . Hemorrhoids   . Hypertension   . IBS (irritable bowel syndrome)   . Malignant neoplasm of lower-inner quadrant of female breast [facility]) January 20, 2013.   invasive mammary cancer, minimal 0.85 cm.  Histologic grade 1.  . Personal history of chemotherapy 2014   BREAST CA  . Personal history of radiation therapy 2014   BREAST CA  . Thyroid disease     PAST SURGICAL HISTOIRY:   Past Surgical History:  Procedure Laterality Date  . BREAST SURGERY Right 02-21-2013   right mastectomy  . CARDIAC CATHETERIZATION    . COLONOSCOPY W/ BIOPSIES  09/15/2010   Colonoscopy completed by Verdie Shire, M.D. Tubular adenoma of the ascending colon and descending colon reported up to 0.4 cm in  diameter. No atypia.  Marland Kitchen ESOPHAGOGASTRODUODENOSCOPY N/A 12/13/2016   Procedure: ESOPHAGOGASTRODUODENOSCOPY (EGD);  Surgeon: [name], MD;  Location: [facility] ENDOSCOPY;  Service: Endoscopy;  Laterality: N/A;  . MASTECTOMY Right 2014   BREAST CA  . UPPER GI ENDOSCOPY  09/15/2010     Completed by Verdie Shire, M.D. for nausea. Normal exam reported.  Marland Kitchen VASCULAR SURGERY      SOCIAL HISTORY:   Social History  Substance Use Topics  . Smoking status: Former Smoker    Years: 20.00  . Smokeless tobacco: Never Used  . Alcohol use No    FAMILY HISTORY:   Family History  Problem Relation Age of Onset  . Cancer Mother 29       breast  . Breast cancer Mother 71  . Cancer Father        colon    DRUG ALLERGIES:  No Known Allergies  REVIEW OF SYSTEMS:  CONSTITUTIONAL: No fever, fatigue or weakness.  EYES: No blurred or double vision.  EARS, NOSE, AND THROAT: No tinnitus or ear pain.  RESPIRATORY: No cough, shortness of breath, wheezing or hemoptysis.  CARDIOVASCULAR: No chest pain, orthopnea, edema.  GASTROINTESTINAL:Has nausea, vomiting, denies diarrhea or abdominal pain.  GENITOURINARY: No dysuria, hematuria.  ENDOCRINE: No polyuria, nocturia,  HEMATOLOGY: No anemia, easy bruising or bleeding SKIN: No rash or lesion. MUSCULOSKELETAL: No joint pain or arthritis.   NEUROLOGIC: No tingling, numbness, weakness.  PSYCHIATRY: No anxiety or  depression.   MEDICATIONS AT HOME:   Prior to Admission medications   Medication Sig Start Date End Date Taking? Authorizing Provider  acetaminophen (TYLENOL 8 HOUR ARTHRITIS PAIN) 650 MG CR tablet Take 650 mg by mouth every 8 (eight) hours as needed for pain.    [provider]  ALPRAZolam Duanne Moron) 0.25 MG tablet Take 0.25 mg by mouth at bedtime as needed.  04/23/15   [provider]  amLODipine (NORVASC) 10 MG tablet Take 10 mg by mouth daily.    [provider]  atorvastatin (LIPITOR) 40 MG tablet Take 1 tablet by mouth once  daily 06/08/15   [provider]  Calcium 600-200 MG-UNIT tablet Take 1 tablet by mouth daily.    [provider]  chlorpheniramine-HYDROcodone (TUSSIONEX) 10-8 MG/5ML SUER hydrocodone 10 mg-chlorpheniramine 8 mg/5 mL oral susp extend.rel 12hr  TAKE 5 ML BY MOUTH NIGHTLY AS NEEDED    [provider]  clopidogrel (PLAVIX) 75 MG tablet clopidogrel 75 mg tablet    [provider]  cyclobenzaprine (FLEXERIL) 10 MG tablet cyclobenzaprine 10 mg tablet    [provider]  doxycycline (VIBRA-TABS) 100 MG tablet doxycycline hyclate 100 mg tablet    [provider]  erythromycin ophthalmic ointment erythromycin 5 mg/gram (0.5 %) eye ointment  PLACE 1 CM RIBBON IN EYES 6 TIMES A DAY X 7-10 DAYS    [provider]  Ferrous Sulfate (IRON) 325 (65 Fe) MG TABS Take by mouth.    [provider]  fluticasone Asencion Islam) 50 MCG/ACT nasal spray  09/11/16   [provider]  gabapentin (NEURONTIN) 100 MG capsule gabapentin 100 mg capsule    [provider]  HYDROcodone-homatropine (HYCODAN) 5-1.5 MG/5ML syrup hydrocodone-homatropine 5 mg-1.5 mg/5 mL syrup  5-10MLS BY MOUTH AT BEDTIME AS NEEDED    [provider]  hydrocortisone (ANUSOL-HC) 25 MG suppository Place 1 suppository (25 mg total) rectally 2 (two) times daily. 11/16/16   [name], MD  hydrOXYzine (ATARAX/VISTARIL) 25 MG tablet hydroxyzine HCl 25 mg tablet    [provider]  letrozole ([place]) 2.5 MG tablet TAKE 1 TABLET BY MOUTH  DAILY 08/06/16   Lloyd Huger, MD  levofloxacin (LEVAQUIN) 500 MG tablet levofloxacin 500 mg tablet    [provider]  levothyroxine (SYNTHROID, LEVOTHROID) 75 MCG tablet Take 75 mcg by mouth daily before breakfast.    [provider]  meloxicam (MOBIC) 15 MG tablet meloxicam 15 mg tablet  TAKE 1 TABLET(S) EVERY DAY BY ORAL ROUTE WITH MEALS.    [provider]  montelukast (SINGULAIR) 10 MG  tablet Take 10 mg by mouth at bedtime.    [provider]  ondansetron (ZOFRAN) 4 MG tablet  10/09/16   [provider]  pantoprazole (PROTONIX) 40 MG tablet Take 1 tablet (40 mg total) by mouth 2 (two) times daily. 12/13/16   Bettey Costa, MD  predniSONE (DELTASONE) 20 MG tablet prednisone 20 mg tablet    [provider]  PROCTOSOL HC 2.5 % rectal cream  09/08/16   [provider]  ramipril (ALTACE) 5 MG capsule ramipril 5 mg capsule    [provider]  temazepam (RESTORIL) 30 MG capsule Take 30 mg by mouth at bedtime. 11/15/16   [provider]  traZODone (DESYREL) 50 MG tablet at bedtime.  10/05/14   [provider]  venlafaxine XR (EFFEXOR-XR) 150 MG 24 hr capsule venlafaxine ER 150 mg capsule,extended release 24 hr    [provider]  venlafaxine XR (EFFEXOR-XR) 37.5 MG 24 hr capsule venlafaxine ER 37.5 mg capsule,extended release 24 hr    [provider]  venlafaxine XR (EFFEXOR-XR) 75 MG 24 hr capsule  11/14/16   [provider]      VITAL SIGNS:  Height 5\' 6"  (1.676 m), weight 78.8 kg (173 lb 11.2 oz).  PHYSICAL EXAMINATION:  GENERAL:  66 y.o.-year-old patient lying in the bed with no acute distress.  EYES: Pupils equal, round, reactive to light and accommodation. No scleral icterus. Extraocular muscles intact.  HEENT: Head atraumatic, normocephalic. Oropharynx and nasopharynx clear.  NECK:  Supple, no jugular venous distention. No thyroid enlargement, no tenderness.  LUNGS: Normal breath sounds bilaterally, no wheezing, rales,rhonchi or crepitation. No use of accessory muscles of respiration.  CARDIOVASCULAR: S1, S2 normal. No murmurs, rubs, or gallops.  ABDOMEN: Soft, nontender, nondistended. Bowel sounds present. No organomegaly or mass.  EXTREMITIES: No pedal edema, cyanosis, or clubbing.  NEUROLOGIC: Cranial nerves II through XII are intact. Muscle strength 5/5 in all extremities. Sensation intact.  Gait not checked.  PSYCHIATRIC: The patient is alert and oriented x 3.  SKIN: No obvious rash, lesion, or ulcer.   LABORATORY PANEL:   CBC  Recent Labs Lab 12/21/16 0750  WBC 9.6  HGB 12.0  HCT 34.3*  PLT 379   ------------------------------------------------------------------------------------------------------------------  Chemistries   Recent Labs Lab 12/21/16 0750  NA 121*  K 3.4*  CL 84*  CO2 26  GLUCOSE 128*  BUN 8  CREATININE 1.05*  CALCIUM 9.8   ------------------------------------------------------------------------------------------------------------------  Cardiac Enzymes No results for input(s): TROPONINI in the last 168 hours. ------------------------------------------------------------------------------------------------------------------  RADIOLOGY:  No results found.  EKG:   Orders placed or performed in visit on 12/21/16  . EKG 12-Lead  . EKG 12-Lead    IMPRESSION AND PLAN:   Abigail Ortega  is a 66 y.o. female with a known history of Congestive heart failure, coronary artery disease, hypertension, hyperlipidemia came to the hospital for outpatient colonoscopy but the procedure was canceled as patient was nauseous following bowel prep and her sodium was down to 121.  #Hyponatremia-possible etiology is from the bowel prep for colonoscopy Admit to MedSurg unit Patient is mentating fine Hydrate with IV fluids normal saline at 20 K Serial BMPs Check fasting lipid panel and TSH Check serum and urine osmolarity and urine creatinine Nephrology consult   #Nausea and vomiting with history of hiatal hernia GI prophylaxis with Protonix Hydrate with IV fluids Patient just had recent EGD which has revealed hiatal hernia and Z line  irregularities biopsies were done by Dr. Isidore Moos Colonoscopy scheduled for today, which was canceled because of the hyponatremia Nothing by mouth Holding Plavix   #Hyperlipidemia Check fasting lipid panel  and currently patient is nothing by mouth  #Hypertension Currently patient is nothing by mouth and getting IV fluids Will provide IV Lopressor as needed for elevated blood pressure   #Chronic history of congestive heart failure Not fluid overloaded at this time Provided gentle hydration with IV fluids and monitoring for symptoms and signs of fluid overload Currently patient is nothing by mouth  #coronary artery disease Patient is asymptomatic Nothing by mouth for now   resume home meds once patient is medically stable  GI prophylaxis with Protonix  All the records are reviewed and case discussed with ED provider. Management plans discussed with the patient, family and they are in agreement.  CODE STATUS: fc   TOTAL TIME TAKING CARE OF THIS PATIENT: 45  minutes.  Note: This dictation was prepared with Dragon dictation along with smaller phrase technology. Any transcriptional errors that result from this process are unintentional.  Nicholes Mango M.D on 12/21/2016 at 12:34 PM  Between 7am to 6pm - Pager - [phone]  After 6pm go to [email or web address] - password [email or web address] [place] [facility]  Office  [phone]  CC: Primary care physician; Perrin Maltese, MD

## 2016-12-21 NOTE — Discharge Summary (Signed)
Report called to Josh RN on 2C 

## 2016-12-21 NOTE — Discharge Summary (Signed)
Labs were drawn for pt and MD made decision that patient should be admitted to hospital for electrolyte correction. MD Allen [place] agrees. Patient discharged in private vehicle to emergency room. Call place to hospitalist

## 2016-12-21 NOTE — Progress Notes (Signed)
[facility]  ROUNDING NOTE   Subjective:  Patient well known to [place]. In the office we were following her for CKD stage III, however renal function improved. Pt was here recently for hyponatremia. This appears to be recurrent in nature. She took magnesium citrate for colon prep.  She now feels weak, particularly in her lower extremeties.    Objective:  Vital signs in last 24 hours:  Temp:  [97.7 F (36.5 C)-98.6 F (37 C)] 98.6 F (37 C) (06/07 1105) Pulse Rate:  [70-79] 75 (06/07 1237) Resp:  [18] 18 (06/07 1021) BP: (132-135)/(65-90) 135/76 (06/07 1237) SpO2:  [100 %] 100 % (06/07 1105) Weight:  [78.8 kg (173 lb 11.2 oz)-79.8 kg (176 lb)] 78.8 kg (173 lb 11.2 oz) (06/07 1100)  Weight change:  Filed Weights   12/21/16 1100  Weight: 78.8 kg (173 lb 11.2 oz)    Intake/Output: No intake/output data recorded.   Intake/Output this shift:  Total I/O In: -  Out: 400 [Urine:400]  Physical Exam: General: No acute distress  Head: Normocephalic, atraumatic. Moist oral mucosal membranes  Eyes: Anicteric  Neck: Supple, trachea midline  Lungs:  Clear to auscultation, normal effort  Heart: S1S2 no rubs  Abdomen:  Soft, nontender, bowel sounds present  Extremities:  peripheral edema.  Neurologic: Awake, alert, following commands  Skin: No lesions       Basic Metabolic Panel:  Recent Labs Lab 12/21/16 0750 12/21/16 1158  NA 121* 125*  K 3.4* 4.1  CL 84* 86*  CO2 26 27  GLUCOSE 128* 124*  BUN 8 7  CREATININE 1.05* 1.00  CALCIUM 9.8 9.7    Liver Function Tests: No results for input(s): AST, ALT, ALKPHOS, BILITOT, PROT, ALBUMIN in the last 168 hours. No results for input(s): LIPASE, AMYLASE in the last 168 hours. No results for input(s): AMMONIA in the last 168 hours.  CBC:  Recent Labs Lab 12/21/16 0750  WBC 9.6  HGB 12.0  HCT 34.3*  MCV 90.5  PLT 379    Cardiac Enzymes: No results for input(s): CKTOTAL, CKMB, CKMBINDEX, TROPONINI in the  last 168 hours.  BNP: Invalid input(s): POCBNP  CBG:  Recent Labs Lab 12/21/16 0740  GLUCAP 126*    Microbiology: Results for orders placed or performed in visit on 07/17/13  Influenza A&B Antigens [facility])     Status: None   Collection Time: 07/21/13 12:26 PM  Result Value Ref Range Status   Micro Text Report   Final       COMMENT                   NEGATIVE FOR INFLUENZA A (ANTIGEN ABSENT)   COMMENT                   NEGATIVE FOR INFLUENZA B (ANTIGEN ABSENT)   ANTIBIOTIC                                                      Culture, blood (single)     Status: None   Collection Time: 07/28/13  2:24 PM  Result Value Ref Range Status   Micro Text Report   Final       COMMENT                   NO GROWTH AEROBICALLY/ANAEROBICALLY IN  5 DAYS   ANTIBIOTIC                                                      Culture, blood (single)     Status: None   Collection Time: 07/28/13  2:50 PM  Result Value Ref Range Status   Micro Text Report   Final       SOURCE: from left ac    COMMENT                   NO GROWTH AEROBICALLY/ANAEROBICALLY IN 5 DAYS   ANTIBIOTIC                                                        Coagulation Studies: No results for input(s): LABPROT, INR in the last 72 hours.  Urinalysis: No results for input(s): COLORURINE, LABSPEC, PHURINE, GLUCOSEU, HGBUR, BILIRUBINUR, KETONESUR, PROTEINUR, UROBILINOGEN, NITRITE, LEUKOCYTESUR in the last 72 hours.  Invalid input(s): APPERANCEUR    Imaging: No results found.   Medications:   . 0.9 % NaCl with KCl 20 mEq / L 75 mL/hr at 12/21/16 1302   . amLODipine  10 mg Oral Daily  . atorvastatin  40 mg Oral Daily  . calcium citrate-vitamin D  1 tablet Oral Daily  . enoxaparin (LOVENOX) injection  40 mg Subcutaneous Q24H  . erythromycin   Both Eyes Q6H  . ferrous sulfate  325 mg Oral q morning - 10a  . fluticasone  1 spray Each Nare Daily  . gabapentin  100 mg Oral TID  . hydrocortisone  25 mg Rectal BID   . letrozole  2.5 mg Oral Daily  . [START ON 12/22/2016] levothyroxine  75 mcg Oral QAC breakfast  . meloxicam  15 mg Oral Daily  . montelukast  10 mg Oral QHS  . pantoprazole  40 mg Oral BID  . [START ON 12/22/2016] predniSONE  20 mg Oral Q breakfast  . ramipril  5 mg Oral Daily  . temazepam  30 mg Oral QHS  . traZODone  50 mg Oral QHS  . venlafaxine XR  150 mg Oral Q breakfast   ALPRAZolam, chlorpheniramine-HYDROcodone, cyclobenzaprine, hydrOXYzine, ondansetron **OR** ondansetron (ZOFRAN) IV  Assessment/ Plan:  66 y.o. female with coronary disease with stents, congestive heart failure, mild LVH, Breast cancer in 2015 treated with surgery- rt mastectomy, chemotherapy and radiation, was admitted on 5/29/2018with nausea, vomiting, coffee-ground emesis after taking colonoscopy prep and was also found to have hyponatremia, now readmitted for recurrent hyponatremia post magnesium citrate colonic prep ingestion.  1. Recurrent hyponatremia:  Pt developed hyponatremia again after taking colonic prep, was taking magnesium citrate this time.  Agree with IVF hydration with 0.9 NS.  Monitor serum sodium closely.    2. Hypokalemia: K up to 4.1 with repltion, continue to monitor.   3.  CKD stage II (followed in office) secondary to NSAIDs.  Continue ramipril.   LOS: 0 Abigail Ortega 6/7/20181:41 PM

## 2016-12-21 NOTE — Progress Notes (Signed)
Pharmacy notified to complete PTA med list

## 2016-12-22 LAB — CBC
HEMATOCRIT: 27.7 % — AB (ref 35.0–47.0)
HEMOGLOBIN: 9.6 g/dL — AB (ref 12.0–16.0)
MCH: 32.3 pg (ref 26.0–34.0)
MCHC: 34.7 g/dL (ref 32.0–36.0)
MCV: 93.2 fL (ref 80.0–100.0)
Platelets: 283 10*3/uL (ref 150–440)
RBC: 2.98 MIL/uL — ABNORMAL LOW (ref 3.80–5.20)
RDW: 12.9 % (ref 11.5–14.5)
WBC: 4.4 10*3/uL (ref 3.6–11.0)

## 2016-12-22 LAB — COMPREHENSIVE METABOLIC PANEL
ALBUMIN: 3.3 g/dL — AB (ref 3.5–5.0)
ALK PHOS: 76 U/L (ref 38–126)
ALT: 15 U/L (ref 14–54)
ANION GAP: 4 — AB (ref 5–15)
AST: 18 U/L (ref 15–41)
BUN: 8 mg/dL (ref 6–20)
CALCIUM: 8.4 mg/dL — AB (ref 8.9–10.3)
CO2: 25 mmol/L (ref 22–32)
Chloride: 101 mmol/L (ref 101–111)
Creatinine, Ser: 1.26 mg/dL — ABNORMAL HIGH (ref 0.44–1.00)
GFR calc Af Amer: 50 mL/min — ABNORMAL LOW (ref >60)
GFR, EST NON AFRICAN AMERICAN: 43 mL/min — AB (ref >60)
GLUCOSE: 90 mg/dL (ref 65–99)
POTASSIUM: 5 mmol/L (ref 3.5–5.1)
Sodium: 130 mmol/L — ABNORMAL LOW (ref 135–145)
TOTAL PROTEIN: 6 g/dL — AB (ref 6.5–8.1)
Total Bilirubin: 0.8 mg/dL (ref 0.3–1.2)

## 2016-12-22 LAB — BASIC METABOLIC PANEL
ANION GAP: 8 (ref 5–15)
BUN: 10 mg/dL (ref 6–20)
CHLORIDE: 96 mmol/L — AB (ref 101–111)
CO2: 24 mmol/L (ref 22–32)
Calcium: 9.4 mg/dL (ref 8.9–10.3)
Creatinine, Ser: 1.16 mg/dL — ABNORMAL HIGH (ref 0.44–1.00)
GFR calc non Af Amer: 48 mL/min — ABNORMAL LOW (ref >60)
GFR, EST AFRICAN AMERICAN: 56 mL/min — AB (ref >60)
GLUCOSE: 129 mg/dL — AB (ref 65–99)
Potassium: 4.6 mmol/L (ref 3.5–5.1)
Sodium: 128 mmol/L — ABNORMAL LOW (ref 135–145)

## 2016-12-22 LAB — LIPID PANEL
CHOLESTEROL: 115 mg/dL (ref 0–200)
HDL: 57 mg/dL (ref >40)
LDL Cholesterol: 48 mg/dL (ref 0–99)
TRIGLYCERIDES: 51 mg/dL (ref <150)
Total CHOL/HDL Ratio: 2 RATIO
VLDL: 10 mg/dL (ref 0–40)

## 2016-12-22 LAB — TSH: TSH: 7.408 u[IU]/mL — ABNORMAL HIGH (ref 0.350–4.500)

## 2016-12-22 MED ORDER — SODIUM CHLORIDE 0.9 % IV SOLN
INTRAVENOUS | Status: DC
  Administered 2016-12-22 (×2): via INTRAVENOUS

## 2016-12-22 MED ORDER — SODIUM CHLORIDE 0.9 % IV SOLN: INTRAVENOUS | Status: DC

## 2016-12-22 MED ORDER — PEG 3350-KCL-NA BICARB-NACL 420 G PO SOLR
2000.0000 mL | Freq: Once | ORAL | Status: AC
  Administered 2016-12-22: 2000 mL via ORAL
  Filled 2016-12-22: qty 4000

## 2016-12-22 NOTE — Progress Notes (Signed)
[place] at [place] NAME: Abigail Ortega    MR#:  [id number]  DATE OF BIRTH:  Sep 01, 1950  SUBJECTIVE:  CHIEF COMPLAINT:  No chief complaint on file.  - Admitted with weakness and low sodium. Improving with IV fluids. -Worried about her colonoscopy tomorrow  REVIEW OF SYSTEMS:  Review of Systems  Constitutional: Negative for chills, fever and malaise/fatigue.  HENT: Negative for ear discharge, hearing loss, nosebleeds and sore throat.   Eyes: Negative for blurred vision and double vision.  Respiratory: Negative for cough, shortness of breath and wheezing.   Cardiovascular: Negative for chest pain, palpitations and leg swelling.  Gastrointestinal: Positive for nausea. Negative for abdominal pain, constipation, diarrhea and vomiting.  Genitourinary: Negative for dysuria.  Musculoskeletal: Negative for myalgias.  Neurological: Negative for dizziness, speech change, focal weakness, seizures and headaches.  Psychiatric/Behavioral: Negative for depression.    DRUG ALLERGIES:  No Known Allergies  VITALS:  Blood pressure (!) 104/52, pulse (!) 46, temperature 98.2 F (36.8 C), temperature source Oral, resp. rate 16, height 5\' 6"  (1.676 m), weight 78.8 kg (173 lb 11.2 oz), SpO2 100 %.  PHYSICAL EXAMINATION:  Physical Exam  GENERAL:  66 y.o.-year-old patient lying in the bed with no acute distress.  EYES: Pupils equal, round, reactive to light and accommodation. No scleral icterus. Extraocular muscles intact.  HEENT: Head atraumatic, normocephalic. Oropharynx and nasopharynx clear.  NECK:  Supple, no jugular venous distention. No thyroid enlargement, no tenderness.  LUNGS: Normal breath sounds bilaterally, no wheezing, rales,rhonchi or crepitation. No use of accessory muscles of respiration.  CARDIOVASCULAR: S1, S2 normal. No murmurs, rubs, or gallops.  ABDOMEN: Soft, nontender, nondistended. Bowel sounds present. No organomegaly or  mass.  EXTREMITIES: No pedal edema, cyanosis, or clubbing.  NEUROLOGIC: Cranial nerves II through XII are intact. Muscle strength 5/5 in all extremities. Sensation intact. Gait not checked.  PSYCHIATRIC: The patient is alert and oriented x 3.  SKIN: No obvious rash, lesion, or ulcer.    LABORATORY PANEL:   CBC  Recent Labs Lab 12/22/16 0422  WBC 4.4  HGB 9.6*  HCT 27.7*  PLT 283   ------------------------------------------------------------------------------------------------------------------  Chemistries   Recent Labs Lab 12/22/16 0422  NA 130*  K 5.0  CL 101  CO2 25  GLUCOSE 90  BUN 8  CREATININE 1.26*  CALCIUM 8.4*  AST 18  ALT 15  ALKPHOS 76  BILITOT 0.8   ------------------------------------------------------------------------------------------------------------------  Cardiac Enzymes No results for input(s): TROPONINI in the last 168 hours. ------------------------------------------------------------------------------------------------------------------  RADIOLOGY:  No results found.  EKG:   Orders placed or performed in visit on 12/21/16  . EKG 12-Lead  . EKG 12-Lead  . EKG 12-Lead    ASSESSMENT AND PLAN:   66 year old female with past medical history significant for congestive heart failure with EF of 35-40%, CAD, hypertension, hyperlipidemia, depression and anxiety, CK D stage III presents to hospital secondary to weakness and was noted to have low sodium.  #1 hyponatremia-secondary to dehydration, hypovolemia. -Continue IV fluids. Appreciate nephrology consult. -Improving sodium. -Also had hypokalemia, however with potassium over corrected to 5 we'll discontinue the potassium supplemented fluids.  #2 chronic kidney disease stage III-stable at this time. Monitor.  #3 Anal pain and history of polyps-appreciate GI input. Half golytely today and for colonoscopy tomorrow  -nothing by mouth starting late this evening. Until then continue clear  liquids -Continue Anusol suppositories  #4 CAD-stable at this time. Follows with cardiology as outpatient. Aspirin and Plavix  on hold for scheduled colonoscopy.  #5 hypertension-hold Norvasc and ramipril due to low normal blood pressures. Also monitor her hyperkalemia.  #6 DVT prophylaxis- on Lovenox   All the records are reviewed and case discussed with Care Management/Social Workerr. Management plans discussed with the patient, family and they are in agreement.  CODE STATUS: Full code  TOTAL TIME TAKING CARE OF THIS PATIENT: 38 minutes.   POSSIBLE D/C IN 1-2 DAYS, DEPENDING ON CLINICAL CONDITION.   Gladstone Lighter M.D on 12/22/2016 at 12:01 PM  Between 7am to 6pm - Pager - [phone]  After 6pm go to [email or web address] - password [email or web address] [facility]  Office  [phone]  CC: Primary care physician; Perrin Maltese, MD

## 2016-12-22 NOTE — Progress Notes (Signed)
Discussed with Dr Tressia Miners- Plan for colonoscopy tomorrow . Prep with half gallon golytely this evening. Check Bmp around 10 pm to ensure no further low electrolytes   Dr [name] MD,MRCP [facility]) Gastroenterology/Hepatology Pager: [phone]

## 2016-12-22 NOTE — Progress Notes (Signed)
[facility]  ROUNDING NOTE   Subjective:  Patient significantly improved. Serum sodium up to 130.    Objective:  Vital signs in last 24 hours:  Temp:  [98.2 F (36.8 C)-99.6 F (37.6 C)] 98.2 F (36.8 C) (06/08 0422) Pulse Rate:  [46-65] 46 (06/08 0830) Resp:  [16-20] 16 (06/08 0422) BP: (90-108)/(48-62) 104/52 (06/08 0830) SpO2:  [98 %-100 %] 100 % (06/08 0422)  Weight change:  Filed Weights   12/21/16 1100  Weight: 78.8 kg (173 lb 11.2 oz)    Intake/Output: I/O last 3 completed shifts: In: 960.3 [I.V.:960.3] Out: 1901 [Urine:1900; Stool:1]   Intake/Output this shift:  No intake/output data recorded.  Physical Exam: General: No acute distress  Head: Normocephalic, atraumatic. Moist oral mucosal membranes  Eyes: Anicteric  Neck: Supple, trachea midline  Lungs:  Clear to auscultation, normal effort  Heart: S1S2 no rubs  Abdomen:  Soft, nontender, bowel sounds present  Extremities:  peripheral edema.  Neurologic: Awake, alert, following commands  Skin: No lesions       Basic Metabolic Panel:  Recent Labs Lab 12/21/16 0750 12/21/16 1158 12/21/16 1733 12/21/16 2320 12/22/16 0422  NA 121* 125* 126* 129* 130*  K 3.4* 4.1 4.1 4.1 5.0  CL 84* 86* 92* 98* 101  CO2 26 27 24 24 25   GLUCOSE 128* 124* 108* 101* 90  BUN 8 7 7 7 8   CREATININE 1.05* 1.00 1.06* 1.13* 1.26*  CALCIUM 9.8 9.7 9.5 8.9 8.4*    Liver Function Tests:  Recent Labs Lab 12/22/16 0422  AST 18  ALT 15  ALKPHOS 76  BILITOT 0.8  PROT 6.0*  ALBUMIN 3.3*   No results for input(s): LIPASE, AMYLASE in the last 168 hours. No results for input(s): AMMONIA in the last 168 hours.  CBC:  Recent Labs Lab 12/21/16 0750 12/22/16 0422  WBC 9.6 4.4  HGB 12.0 9.6*  HCT 34.3* 27.7*  MCV 90.5 93.2  PLT 379 283    Cardiac Enzymes: No results for input(s): CKTOTAL, CKMB, CKMBINDEX, TROPONINI in the last 168 hours.  BNP: Invalid input(s): POCBNP  CBG:  Recent  Labs Lab 12/21/16 0740  GLUCAP 126*    Microbiology: Results for orders placed or performed in visit on 07/17/13  Influenza A&B Antigens [facility])     Status: None   Collection Time: 07/21/13 12:26 PM  Result Value Ref Range Status   Micro Text Report   Final       COMMENT                   NEGATIVE FOR INFLUENZA A (ANTIGEN ABSENT)   COMMENT                   NEGATIVE FOR INFLUENZA B (ANTIGEN ABSENT)   ANTIBIOTIC                                                      Culture, blood (single)     Status: None   Collection Time: 07/28/13  2:24 PM  Result Value Ref Range Status   Micro Text Report   Final       COMMENT                   NO GROWTH AEROBICALLY/ANAEROBICALLY IN 5 DAYS   ANTIBIOTIC  Culture, blood (single)     Status: None   Collection Time: 07/28/13  2:50 PM  Result Value Ref Range Status   Micro Text Report   Final       SOURCE: from left ac    COMMENT                   NO GROWTH AEROBICALLY/ANAEROBICALLY IN 5 DAYS   ANTIBIOTIC                                                        Coagulation Studies: No results for input(s): LABPROT, INR in the last 72 hours.  Urinalysis: No results for input(s): COLORURINE, LABSPEC, PHURINE, GLUCOSEU, HGBUR, BILIRUBINUR, KETONESUR, PROTEINUR, UROBILINOGEN, NITRITE, LEUKOCYTESUR in the last 72 hours.  Invalid input(s): APPERANCEUR    Imaging: No results found.   Medications:   . sodium chloride 75 mL/hr at 12/22/16 1055  . sodium chloride     . atorvastatin  40 mg Oral Daily  . calcium citrate-vitamin D  1 tablet Oral Daily  . enoxaparin (LOVENOX) injection  40 mg Subcutaneous Q24H  . ferrous sulfate  325 mg Oral q morning - 10a  . fluticasone  1 spray Each Nare Daily  . hydrocortisone  25 mg Rectal BID  . letrozole  2.5 mg Oral Daily  . levothyroxine  75 mcg Oral QAC breakfast  . montelukast  10 mg Oral QHS  . pantoprazole  40 mg Oral BID  .  polyethylene glycol-electrolytes  2,000 mL Oral Once  . predniSONE  20 mg Oral Q breakfast  . temazepam  30 mg Oral QHS  . traZODone  50 mg Oral QHS  . venlafaxine XR  150 mg Oral Q breakfast   acetaminophen, ALPRAZolam, chlorpheniramine-HYDROcodone, cyclobenzaprine, hydrocortisone, hydrOXYzine, ondansetron **OR** ondansetron (ZOFRAN) IV  Assessment/ Plan:  65 y.o. female with coronary disease with stents, congestive heart failure, mild LVH, Breast cancer in 2015 treated with surgery- rt mastectomy, chemotherapy and radiation, was admitted on 5/29/2018with nausea, vomiting, coffee-ground emesis after taking colonoscopy prep and was also found to have hyponatremia, now readmitted for recurrent hyponatremia post magnesium citrate colonic prep ingestion.  1. Recurrent hyponatremia:  Pt developed hyponatremia again after taking colonic prep, was taking magnesium citrate this time.   -  Serum sodium has corrected appropriately. We will reduce IV fluid rate to 50 cc per hour.  2. Hypokalemia: Now resolved. Serum potassium actually up to 5.0.  3.  CKD stage II (followed in office) secondary to NSAIDs.  Renal function has been fluctuating a bit. Currently EGFR is 50.   LOS: 1 Lou Irigoyen 6/8/20181:23 PM

## 2016-12-23 ENCOUNTER — Encounter
Admission: AD | Disposition: A | Discharge status: Home / Self Care | Payer: Self-pay | Source: Ambulatory Visit | Attending: Internal Medicine

## 2016-12-23 ENCOUNTER — Inpatient Hospital Stay: Payer: Medicare Other | Admitting: Anesthesiology

## 2016-12-23 DIAGNOSIS — Z860100 Personal history of colon polyps, unspecified: Secondary | ICD-10-CM

## 2016-12-23 HISTORY — PX: COLONOSCOPY WITH PROPOFOL: SHX5780

## 2016-12-23 LAB — BASIC METABOLIC PANEL
ANION GAP: 7 (ref 5–15)
BUN: 8 mg/dL (ref 6–20)
CALCIUM: 9.3 mg/dL (ref 8.9–10.3)
CO2: 26 mmol/L (ref 22–32)
Chloride: 102 mmol/L (ref 101–111)
Creatinine, Ser: 1.17 mg/dL — ABNORMAL HIGH (ref 0.44–1.00)
GFR, EST AFRICAN AMERICAN: 55 mL/min — AB (ref >60)
GFR, EST NON AFRICAN AMERICAN: 47 mL/min — AB (ref >60)
Glucose, Bld: 89 mg/dL (ref 65–99)
POTASSIUM: 4.1 mmol/L (ref 3.5–5.1)
SODIUM: 135 mmol/L (ref 135–145)

## 2016-12-23 SURGERY — COLONOSCOPY WITH PROPOFOL
Anesthesia: General

## 2016-12-23 MED ORDER — PROPOFOL 10 MG/ML IV BOLUS
INTRAVENOUS | Status: DC | PRN
  Administered 2016-12-23: 80 mg via INTRAVENOUS

## 2016-12-23 MED ORDER — PRAMOXINE HCL 1 % RE FOAM: Freq: Three times a day (TID) | RECTAL | 0 refills | Status: DC | PRN

## 2016-12-23 MED ORDER — SODIUM CHLORIDE 0.9 % IJ SOLN
INTRAMUSCULAR | Status: AC
  Filled 2016-12-23: qty 10

## 2016-12-23 MED ORDER — PROPOFOL 500 MG/50ML IV EMUL
INTRAVENOUS | Status: DC | PRN
  Administered 2016-12-23: 150 ug/kg/min via INTRAVENOUS

## 2016-12-23 MED ORDER — EPHEDRINE SULFATE 50 MG/ML IJ SOLN
INTRAMUSCULAR | Status: DC | PRN
  Administered 2016-12-23: 10 mg via INTRAVENOUS

## 2016-12-23 MED ORDER — PROPOFOL 10 MG/ML IV BOLUS
INTRAVENOUS | Status: AC
  Filled 2016-12-23: qty 40

## 2016-12-23 MED ORDER — AMLODIPINE BESYLATE 5 MG PO TABS: 5.0000 mg | ORAL_TABLET | Freq: Every day | ORAL | 2 refills | Status: DC

## 2016-12-23 MED ORDER — LIDOCAINE HCL (PF) 2 % IJ SOLN
INTRAMUSCULAR | Status: AC
  Filled 2016-12-23: qty 2

## 2016-12-23 MED ORDER — PHENYLEPHRINE HCL 10 MG/ML IJ SOLN
INTRAMUSCULAR | Status: DC | PRN
  Administered 2016-12-23: 100 ug via INTRAVENOUS

## 2016-12-23 MED ORDER — PHENYLEPHRINE HCL 10 MG/ML IJ SOLN
INTRAMUSCULAR | Status: AC
  Filled 2016-12-23: qty 1

## 2016-12-23 MED ORDER — SUCCINYLCHOLINE CHLORIDE 20 MG/ML IJ SOLN
INTRAMUSCULAR | Status: AC
  Filled 2016-12-23: qty 1

## 2016-12-23 MED ORDER — PRAMOXINE HCL 1 % RE FOAM
Freq: Three times a day (TID) | RECTAL | Status: DC | PRN
  Filled 2016-12-23: qty 15

## 2016-12-23 NOTE — Anesthesia Post-op Follow-up Note (Cosign Needed)
Anesthesia QCDR form completed.        

## 2016-12-23 NOTE — Brief Op Note (Signed)
Small internal and external hemorrhoids. Small benign-appearing cecal polyp removed with cold biopsy. Diffuse diverticulosis. F/U on path as outpt. Soft diet and advance diet as tolerated to heart healthy diet. Topical treatment for hemorrhoids with Proctofoam and may need therapeutic maneuvers to hemorrhoids as an outpt if anal pain persists but defer to Dr. Vicente Males. Will sign off. Call if questions.

## 2016-12-23 NOTE — Anesthesia Preprocedure Evaluation (Addendum)
Anesthesia Evaluation  Patient identified by MRN, date of birth, ID band Patient awake    Reviewed: Allergy & Precautions, NPO status , Patient's Chart, lab work & pertinent test results  History of Anesthesia Complications Negative for: history of anesthetic complications  Airway Mallampati: II  TM Distance: >3 FB Neck ROM: Full    Dental  (+) Edentulous Upper, Edentulous Lower   Pulmonary neg sleep apnea, neg COPD, former smoker,    breath sounds clear to auscultation- rhonchi (-) wheezing      Cardiovascular hypertension, + CAD, + Past MI, + Cardiac Stents (2011) and +CHF   Rhythm:Regular Rate:Normal - Systolic murmurs and - Diastolic murmurs Echo 0/99/83: MILD LV SYSTOLIC DYSFUNCTION  WITH MILD LVH NORMAL RIGHT VENTRICULAR SYSTOLIC FUNCTION MILD VALVULAR REGURGITATION (mild MR, mild TR, trivial AI) NO VALVULAR STENOSIS EF 40%   Neuro/Psych  Headaches, PSYCHIATRIC DISORDERS Anxiety Depression    GI/Hepatic Neg liver ROS, Hx of cancer   Endo/Other  neg diabetesHypothyroidism   Renal/GU negative Renal ROS     Musculoskeletal  (+) Arthritis ,   Abdominal (+) - obese,   Peds  Hematology negative hematology ROS (+)   Anesthesia Other Findings Past Medical History: No date: Arthritis 2014: Breast cancer ([place])     Comment: Right breast cancer - chemo, radiation and               Mastectomy 2013: CHF (congestive heart failure) (HCC) No date: Clotting disorder ([place])     Comment: blood clots No date: GI bleed 2012: Heart attack ([place])     Comment: coronary stent x2 completed by Dr. Clayborn Bigness. Apr 29, 2013: Heart attack ([place]) No date: Hemorrhoids No date: Hypertension No date: IBS (irritable bowel syndrome) January 20, 2013.: Malignant neoplasm of lower-inner quadrant of *     Comment: invasive mammary cancer, minimal 0.85 cm.                Histologic grade 1. 2014: Personal history of chemotherapy     Comment:  BREAST CA 2014: Personal history of radiation therapy     Comment: BREAST CA No date: Thyroid disease   Reproductive/Obstetrics                             Anesthesia Physical Anesthesia Plan  ASA: III  Anesthesia Plan: General   Post-op Pain Management:    Induction: Intravenous  PONV Risk Score and Plan: 1 and Propofol and Treatment may vary due to age or medical condition  Airway Management Planned: Natural Airway  Additional Equipment:   Intra-op Plan:   Post-operative Plan:   Informed Consent: I have reviewed the patients History and Physical, chart, labs and discussed the procedure including the risks, benefits and alternatives for the proposed anesthesia with the patient or authorized representative who has indicated his/her understanding and acceptance.   Dental advisory given  Plan Discussed with: CRNA and Anesthesiologist  Anesthesia Plan Comments:        Anesthesia Quick Evaluation

## 2016-12-23 NOTE — Discharge Summary (Signed)
[name] [place] NAME: Abigail Ortega    MR#:  [id number]  DATE OF BIRTH:  1950/08/31  DATE OF ADMISSION:  12/21/2016   ADMITTING PHYSICIAN: Vaughan Basta, MD  DATE OF DISCHARGE: 12/23/2016  PRIMARY CARE PHYSICIAN: Perrin Maltese, MD   ADMISSION DIAGNOSIS:   Hyponatremia surveillance  DISCHARGE DIAGNOSIS:   Active Problems:   Hyponatremia   Personal history of colonic polyps   SECONDARY DIAGNOSIS:   Past Medical History:  Diagnosis Date  . Arthritis   . Breast cancer [facility]) 2014   Right breast cancer - chemo, radiation and Mastectomy  . CHF (congestive heart failure) ([place]) 2013  . Clotting disorder ([place])    blood clots  . GI bleed   . Heart attack [facility]) 2012   coronary stent x2 completed by Dr. Clayborn Bigness.  Marland Kitchen Heart attack ([place]) Apr 29, 2013  . Hemorrhoids   . Hypertension   . IBS (irritable bowel syndrome)   . Malignant neoplasm of lower-inner quadrant of female breast [facility]) January 20, 2013.   invasive mammary cancer, minimal 0.85 cm.  Histologic grade 1.  . Personal history of chemotherapy 2014   BREAST CA  . Personal history of radiation therapy 2014   BREAST CA  . Thyroid disease     HOSPITAL COURSE:   66 year old female with past medical history significant for congestive heart failure with EF of 35-40%, CAD, hypertension, hyperlipidemia, depression and anxiety, CK D stage III presents to hospital secondary to weakness and was noted to have low sodium.  #1 hyponatremia-secondary to dehydration, hypovolemia. -received IV fluids. Appreciate nephrology consult. -Improved sodium. -Also had hypokalemia, which is corrected  #2 chronic kidney disease stage III-stable at this time. Monitor. F/u with nephrology as outpatient  #3 Anal pain and history of polyps-appreciate GI input. S/p colonoscopy today with small internal and external hemorrhoids - proctofoam recommended and GI f/u in 3-4  weeks -Continue Anusol suppositories prn  #4 CAD-stable at this time. Follows with cardiology as outpatient. Aspirin and Plavix can be restarted at discharge  #5 hypertension-decreased norvasc dose as BP has been on low normal side here in the hospital. Please re-evaluate as outpatient  For discharge today  DISCHARGE CONDITIONS:   Guarded  CONSULTS OBTAINED:   Treatment Team:  [name], MD Anthonette Legato, MD  DRUG ALLERGIES:   No Known Allergies DISCHARGE MEDICATIONS:   Allergies as of 12/23/2016   No Known Allergies     Medication List    TAKE these medications   ALPRAZolam 0.25 MG tablet Commonly known as:  XANAX Take 0.25 mg by mouth at bedtime as needed.   amLODipine 5 MG tablet Commonly known as:  NORVASC Take 1 tablet (5 mg total) by mouth daily. What changed:  medication strength  how much to take   aspirin EC 81 MG tablet Take 81 mg by mouth daily.   atorvastatin 40 MG tablet Commonly known as:  LIPITOR Take 1 tablet by mouth once daily   Calcium 600-200 MG-UNIT tablet Take 1 tablet by mouth daily.   Cholecalciferol 1000 units tablet Take 1,000 Units by mouth daily.   clopidogrel 75 MG tablet Commonly known as:  PLAVIX clopidogrel 75 mg tablet   fluticasone 50 MCG/ACT nasal spray Commonly known as:  FLONASE   hydrocortisone 25 MG suppository Commonly known as:  ANUSOL-HC Place 1 suppository (25 mg total) rectally 2 (two) times daily.   Iron 325 (65 Fe) MG Tabs Take 1  tablet by mouth daily.   letrozole 2.5 MG tablet Commonly known as:  FEMARA TAKE 1 TABLET BY MOUTH  DAILY   levothyroxine 75 MCG tablet Commonly known as:  SYNTHROID, LEVOTHROID Take 75 mcg by mouth daily before breakfast.   Magnesium 200 MG Tabs Take 1 tablet by mouth daily.   montelukast 10 MG tablet Commonly known as:  SINGULAIR Take 10 mg by mouth at bedtime.   multivitamin tablet Take 1 tablet by mouth daily.   pantoprazole 40 MG tablet Commonly  known as:  PROTONIX Take 1 tablet (40 mg total) by mouth 2 (two) times daily. What changed:  when to take this   pramoxine 1 % foam Commonly known as:  PROCTOFOAM Place rectally 3 (three) times daily as needed for itching.   traZODone 50 MG tablet Commonly known as:  DESYREL Take 100 mg by mouth at bedtime.   TYLENOL 8 HOUR ARTHRITIS PAIN 650 MG CR tablet Generic drug:  acetaminophen Take 650 mg by mouth every 8 (eight) hours as needed for pain.   venlafaxine XR 150 MG 24 hr capsule Commonly known as:  EFFEXOR-XR venlafaxine ER 150 mg capsule,extended release 24 hr        DISCHARGE INSTRUCTIONS:   1. PCP f/u in 1-2 weeks 2. GI f/u in 3-4 weeks  DIET:   Cardiac diet  ACTIVITY:   Activity as tolerated  OXYGEN:   Home Oxygen: No.  Oxygen Delivery: room air  DISCHARGE LOCATION:   home   If you experience worsening of your admission symptoms, develop shortness of breath, life threatening emergency, suicidal or homicidal thoughts you must seek medical attention immediately by calling 911 or calling your MD immediately  if symptoms less severe.  You Must read complete instructions/literature along with all the possible adverse reactions/side effects for all the Medicines you take and that have been prescribed to you. Take any new Medicines after you have completely understood and accpet all the possible adverse reactions/side effects.   Please note  You were cared for by a hospitalist during your hospital stay. If you have any questions about your discharge medications or the care you received while you were in the hospital after you are discharged, you can call the unit and asked to speak with the hospitalist on call if the hospitalist that took care of you is not available. Once you are discharged, your primary care physician will handle any further medical issues. Please note that NO REFILLS for any discharge medications will be authorized once you are discharged, as  it is imperative that you return to your primary care physician (or establish a relationship with a primary care physician if you do not have one) for your aftercare needs so that they can reassess your need for medications and monitor your lab values.    On the day of Discharge:  VITAL SIGNS:   Blood pressure 118/65, pulse 69, temperature 99.2 F (37.3 C), resp. rate (!) 21, height 5\' 6"  (1.676 m), weight 78.5 kg (173 lb), SpO2 100 %.  PHYSICAL EXAMINATION:    GENERAL:  66 y.o.-year-old patient lying in the bed with no acute distress.  EYES: Pupils equal, round, reactive to light and accommodation. No scleral icterus. Extraocular muscles intact.  HEENT: Head atraumatic, normocephalic. Oropharynx and nasopharynx clear.  NECK:  Supple, no jugular venous distention. No thyroid enlargement, no tenderness.  LUNGS: Normal breath sounds bilaterally, no wheezing, rales,rhonchi or crepitation. No use of accessory muscles of respiration.  CARDIOVASCULAR: S1, S2  normal. No murmurs, rubs, or gallops.  ABDOMEN: Soft, nontender, nondistended. Bowel sounds present. No organomegaly or mass.  EXTREMITIES: No pedal edema, cyanosis, or clubbing.  NEUROLOGIC: Cranial nerves II through XII are intact. Muscle strength 5/5 in all extremities. Sensation intact. Gait not checked.  PSYCHIATRIC: The patient is alert and oriented x 3.  SKIN: No obvious rash, lesion, or ulcer.    DATA REVIEW:   CBC  Recent Labs Lab 12/22/16 0422  WBC 4.4  HGB 9.6*  HCT 27.7*  PLT 283    Chemistries   Recent Labs Lab 12/22/16 0422  12/23/16 0604  NA 130*  < > 135  K 5.0  < > 4.1  CL 101  < > 102  CO2 25  < > 26  GLUCOSE 90  < > 89  BUN 8  < > 8  CREATININE 1.26*  < > 1.17*  CALCIUM 8.4*  < > 9.3  AST 18  --   --   ALT 15  --   --   ALKPHOS 76  --   --   BILITOT 0.8  --   --   < > = values in this interval not displayed.   Microbiology Results  Results for orders placed or performed in visit on 07/17/13   Influenza A&B Antigens [facility])     Status: None   Collection Time: 07/21/13 12:26 PM  Result Value Ref Range Status   Micro Text Report   Final       COMMENT                   NEGATIVE FOR INFLUENZA A (ANTIGEN ABSENT)   COMMENT                   NEGATIVE FOR INFLUENZA B (ANTIGEN ABSENT)   ANTIBIOTIC                                                      Culture, blood (single)     Status: None   Collection Time: 07/28/13  2:24 PM  Result Value Ref Range Status   Micro Text Report   Final       COMMENT                   NO GROWTH AEROBICALLY/ANAEROBICALLY IN 5 DAYS   ANTIBIOTIC                                                      Culture, blood (single)     Status: None   Collection Time: 07/28/13  2:50 PM  Result Value Ref Range Status   Micro Text Report   Final       SOURCE: from left ac    COMMENT                   NO GROWTH AEROBICALLY/ANAEROBICALLY IN 5 DAYS   ANTIBIOTIC  RADIOLOGY:  No results found.   Management plans discussed with the patient, family and they are in agreement.  CODE STATUS:     Code Status Orders        Start     Ordered   12/21/16 1131  Full code  Continuous     12/21/16 1132    Code Status History    Date Active Date Inactive Code Status Order ID Comments User Context   12/12/2016  8:35 AM 12/13/2016  9:31 PM Full Code [id number]  Bettey Costa, MD Inpatient      TOTAL TIME TAKING CARE OF THIS PATIENT: 38 minutes.    Chera Slivka M.D on 12/23/2016 at 12:49 PM  Between 7am to 6pm - Pager - [phone]  After 6pm go to [email or web address] - Proofreader  [employer] [facility] [facility]  Office  [phone]  CC: Primary care physician; Perrin Maltese, MD   Note: This dictation was prepared with Dragon dictation along with smaller phrase technology. Any transcriptional errors that result from this process are unintentional.

## 2016-12-23 NOTE — H&P (View-Only) (Signed)
Discussed with Dr Tressia Miners- Plan for colonoscopy tomorrow . Prep with half gallon golytely this evening. Check Bmp around 10 pm to ensure no further low electrolytes   Dr [name] MD,MRCP [facility]) Gastroenterology/Hepatology Pager: [phone]

## 2016-12-23 NOTE — Progress Notes (Signed)
[facility]  ROUNDING NOTE   Subjective:  Pt had colonoscopy today. Doing well. Na up to 135.      Objective:  Vital signs in last 24 hours:  Temp:  [97.8 F (36.6 C)-99.2 F (37.3 C)] 99.2 F (37.3 C) (06/09 0911) Pulse Rate:  [69-81] 69 (06/09 0926) Resp:  [16-21] 21 (06/09 0926) BP: (111-148)/(57-71) 118/65 (06/09 0926) SpO2:  [96 %-100 %] 100 % (06/09 0926) Weight:  [78.5 kg (173 lb)] 78.5 kg (173 lb) (06/09 0749)  Weight change:  Filed Weights   12/21/16 1100 12/23/16 0749  Weight: 78.8 kg (173 lb 11.2 oz) 78.5 kg (173 lb)    Intake/Output: I/O last 3 completed shifts: In: 2380.3 [P.O.:1020; I.V.:1360.3] Out: 1001 [Urine:1000; Stool:1]   Intake/Output this shift:  Total I/O In: 625 [I.V.:625] Out: 400 [Urine:400]  Physical Exam: General: No acute distress  Head: Normocephalic, atraumatic. Moist oral mucosal membranes  Eyes: Anicteric  Neck: Supple, trachea midline  Lungs:  Clear to auscultation, normal effort  Heart: S1S2 no rubs  Abdomen:  Soft, nontender, bowel sounds present  Extremities:  peripheral edema.  Neurologic: Awake, alert, following commands  Skin: No lesions       Basic Metabolic Panel:  Recent Labs Lab 12/21/16 1733 12/21/16 2320 12/22/16 0422 12/22/16 1955 12/23/16 0604  NA 126* 129* 130* 128* 135  K 4.1 4.1 5.0 4.6 4.1  CL 92* 98* 101 96* 102  CO2 24 24 25 24 26   GLUCOSE 108* 101* 90 129* 89  BUN 7 7 8 10 8   CREATININE 1.06* 1.13* 1.26* 1.16* 1.17*  CALCIUM 9.5 8.9 8.4* 9.4 9.3    Liver Function Tests:  Recent Labs Lab 12/22/16 0422  AST 18  ALT 15  ALKPHOS 76  BILITOT 0.8  PROT 6.0*  ALBUMIN 3.3*   No results for input(s): LIPASE, AMYLASE in the last 168 hours. No results for input(s): AMMONIA in the last 168 hours.  CBC:  Recent Labs Lab 12/21/16 0750 12/22/16 0422  WBC 9.6 4.4  HGB 12.0 9.6*  HCT 34.3* 27.7*  MCV 90.5 93.2  PLT 379 283    Cardiac Enzymes: No results for  input(s): CKTOTAL, CKMB, CKMBINDEX, TROPONINI in the last 168 hours.  BNP: Invalid input(s): POCBNP  CBG:  Recent Labs Lab 12/21/16 0740  GLUCAP 126*    Microbiology: Results for orders placed or performed in visit on 07/17/13  Influenza A&B Antigens [facility])     Status: None   Collection Time: 07/21/13 12:26 PM  Result Value Ref Range Status   Micro Text Report   Final       COMMENT                   NEGATIVE FOR INFLUENZA A (ANTIGEN ABSENT)   COMMENT                   NEGATIVE FOR INFLUENZA B (ANTIGEN ABSENT)   ANTIBIOTIC                                                      Culture, blood (single)     Status: None   Collection Time: 07/28/13  2:24 PM  Result Value Ref Range Status   Micro Text Report   Final       COMMENT  NO GROWTH AEROBICALLY/ANAEROBICALLY IN 5 DAYS   ANTIBIOTIC                                                      Culture, blood (single)     Status: None   Collection Time: 07/28/13  2:50 PM  Result Value Ref Range Status   Micro Text Report   Final       SOURCE: from left ac    COMMENT                   NO GROWTH AEROBICALLY/ANAEROBICALLY IN 5 DAYS   ANTIBIOTIC                                                        Coagulation Studies: No results for input(s): LABPROT, INR in the last 72 hours.  Urinalysis: No results for input(s): COLORURINE, LABSPEC, PHURINE, GLUCOSEU, HGBUR, BILIRUBINUR, KETONESUR, PROTEINUR, UROBILINOGEN, NITRITE, LEUKOCYTESUR in the last 72 hours.  Invalid input(s): APPERANCEUR    Imaging: No results found.   Medications:   . sodium chloride 50 mL/hr at 12/22/16 2321  . sodium chloride     . atorvastatin  40 mg Oral Daily  . calcium citrate-vitamin D  1 tablet Oral Daily  . enoxaparin (LOVENOX) injection  40 mg Subcutaneous Q24H  . ferrous sulfate  325 mg Oral q morning - 10a  . fluticasone  1 spray Each Nare Daily  . hydrocortisone  25 mg Rectal BID  . letrozole  2.5 mg Oral Daily  .  levothyroxine  75 mcg Oral QAC breakfast  . montelukast  10 mg Oral QHS  . pantoprazole  40 mg Oral BID  . predniSONE  20 mg Oral Q breakfast  . temazepam  30 mg Oral QHS  . traZODone  50 mg Oral QHS  . venlafaxine XR  150 mg Oral Q breakfast   acetaminophen, ALPRAZolam, chlorpheniramine-HYDROcodone, cyclobenzaprine, hydrocortisone, hydrOXYzine, ondansetron **OR** ondansetron (ZOFRAN) IV, pramoxine  Assessment/ Plan:  66 y.o. female with coronary disease with stents, congestive heart failure, mild LVH, Breast cancer in 2015 treated with surgery- rt mastectomy, chemotherapy and radiation, was admitted on 5/29/2018with nausea, vomiting, coffee-ground emesis after taking colonoscopy prep and was also found to have hyponatremia, now readmitted for recurrent hyponatremia post magnesium citrate colonic prep ingestion.  1. Recurrent hyponatremia:  Pt developed hyponatremia again after taking colonic prep, was taking magnesium citrate this time.   -  Serum sodium up to 135.  Ok to stop IVFs upon discharge.  Encouraged patient to drink gatorade over the weekend.   2. Hypokalemia: K currently up to 4.1 post repletion.   3.  CKD stage II (followed in office) secondary to NSAIDs.  Cr stable at 1.17.  Will continue to monitor as outpt.      LOS: 2 Eiliana Drone 6/9/201812:16 PM

## 2016-12-23 NOTE — Addendum Note (Signed)
Addendum  created 12/23/16 0920 by Doreen Salvage, CRNA   Anesthesia Intra Blocks edited, Anesthesia Intra Flowsheets edited, Child order released for a procedure order, Sign clinical note

## 2016-12-23 NOTE — Interval H&P Note (Signed)
History and Physical Interval Note:  12/23/2016 8:16 AM  Abigail Ortega  has presented today for surgery, with the diagnosis of surveillance  The various methods of treatment have been discussed with the patient and family. After consideration of risks, benefits and other options for treatment, the patient has consented to  Procedure(s): COLONOSCOPY WITH PROPOFOL (N/A) as a surgical intervention .  The patient's history has been reviewed, patient examined, no change in status, stable for surgery.  I have reviewed the patient's chart and labs.  Questions were answered to the patient's satisfaction.     [place] C.

## 2016-12-23 NOTE — Transfer of Care (Signed)
Immediate Anesthesia Transfer of Care Note  Patient: Abigail Ortega  Procedure(s) Performed: Procedure(s): COLONOSCOPY WITH PROPOFOL (N/A)  Patient Location: PACU and Endoscopy Unit  Anesthesia Type:General  Level of Consciousness: sedated  Airway & Oxygen Therapy: Patient Spontanous Breathing and Patient connected to nasal cannula oxygen  Post-op Assessment: Report given to RN and Post -op Vital signs reviewed and stable  Post vital signs: Reviewed and stable  Last Vitals:  Vitals:   12/23/16 0749 12/23/16 0842  BP: 111/65 (!) 114/57  Pulse: 78 76  Resp: 18 17  Temp: 37.1 C 02.7 C    Complications: No apparent anesthesia complications

## 2016-12-23 NOTE — Anesthesia Postprocedure Evaluation (Signed)
Anesthesia Post Note  Patient: Abigail Ortega  Procedure(s) Performed: Procedure(s) (LRB): COLONOSCOPY WITH PROPOFOL (N/A)  Patient location during evaluation: PACU Anesthesia Type: General Level of consciousness: awake and alert and oriented Pain management: pain level controlled Vital Signs Assessment: post-procedure vital signs reviewed and stable Respiratory status: spontaneous breathing, nonlabored ventilation and respiratory function stable Cardiovascular status: blood pressure returned to baseline and stable Postop Assessment: no signs of nausea or vomiting Anesthetic complications: no     Last Vitals:  Vitals:   12/23/16 0842 12/23/16 0856  BP: (!) 114/57 125/60  Pulse: 76 73  Resp: 17 16  Temp: 37.1 C     Last Pain:  Vitals:   12/23/16 0749  TempSrc: Tympanic  PainSc:                  [name]

## 2016-12-23 NOTE — Op Note (Signed)
[facility] Gastroenterology Patient Name: Abigail Ortega Procedure Date: 12/23/2016 8:02 AM MRN: [id number] Account #: [id number] Date of Birth: 08-16-1950 Admit Type: Inpatient Age: 66 Room: [facility] ENDO ROOM 4 Gender: Female Note Status: Finalized Procedure:            Colonoscopy Indications:          High risk colon cancer surveillance: Personal history                        of colonic polyps, Last colonoscopy: March 2012 Providers:            Lear Ng, MD Medicines:            Propofol per Anesthesia, Monitored Anesthesia Care Complications:        No immediate complications. Procedure:            Pre-Anesthesia Assessment:                       - Prior to the procedure, a History and Physical was                        performed, and patient medications and allergies were                        reviewed. The patient's tolerance of previous                        anesthesia was also reviewed. The risks and benefits of                        the procedure and the sedation options and risks were                        discussed with the patient. All questions were                        answered, and informed consent was obtained. Prior                        Anticoagulants: The patient has taken Plavix                        (clopidogrel), last dose was 9 days prior to procedure.                        ASA Grade Assessment: III - A patient with severe                        systemic disease. After reviewing the risks and                        benefits, the patient was deemed in satisfactory                        condition to undergo the procedure.                       After obtaining informed consent, the colonoscope was  passed under direct vision. Throughout the procedure,                        the patient's blood pressure, pulse, and oxygen                        saturations were monitored continuously. The             Colonoscope was introduced through the anus and                        advanced to the the cecum, identified by appendiceal                        orifice and ileocecal valve. The colonoscopy was                        performed without difficulty. The patient tolerated the                        procedure well. The quality of the bowel preparation                        was fair and fair but repeated irrigation led to a good                        and adequate prep. The ileocecal valve, appendiceal                        orifice, and rectum were photographed. Findings:      The perianal exam findings include non-thrombosed external hemorrhoids.      Internal hemorrhoids were found during retroflexion. The hemorrhoids       were small and Grade I (internal hemorrhoids that do not prolapse).      A 2 mm polyp was found in the cecum. The polyp was semi-sessile. The       polyp was removed with a cold biopsy forceps. Resection and retrieval       were complete. Estimated blood loss was minimal.      Multiple small and large-mouthed diverticula were found in the entire       colon. Impression:           - Preparation of the colon was fair.                       - Non-thrombosed external hemorrhoids found on perianal                        exam.                       - Internal hemorrhoids.                       - One 2 mm polyp in the cecum, removed with a cold                        biopsy forceps. Resected and retrieved.                       - Diverticulosis in the entire examined colon.  Recommendation:       - High fiber diet.                       - Await pathology results.                       - Repeat colonoscopy for surveillance based on                        pathology results. Procedure Code(s):    --- Professional ---                       [phone], Colonoscopy, flexible; with biopsy, single or                        multiple Diagnosis Code(s):    --- Professional ---                        Z86.010, Personal history of colonic polyps                       D12.0, Benign neoplasm of cecum                       K64.0, First degree hemorrhoids                       K64.4, Residual hemorrhoidal skin tags                       K57.30, Diverticulosis of large intestine without                        perforation or abscess without bleeding CPT copyright 2016 American Medical Association. All rights reserved. The codes documented in this report are preliminary and upon coder review may  be revised to meet current compliance requirements. Lear Ng, MD 12/23/2016 8:43:57 AM This report has been signed electronically. Number of Addenda: 0 Note Initiated On: 12/23/2016 8:02 AM Scope Withdrawal Time: 0 hours 6 minutes 43 seconds  Total Procedure Duration: 0 hours 8 minutes 49 seconds       [facility]

## 2016-12-23 NOTE — Progress Notes (Signed)
Discharge instructions as order; denies pain at this time; tolerating diet; voices understanding of discharge instructions; discharge via w/c by staff with family by side.

## 2016-12-23 NOTE — Anesthesia Procedure Notes (Signed)
Date/Time: 12/23/2016 8:55 AM Performed by: Doreen Salvage Pre-anesthesia Checklist: Patient identified, Emergency Drugs available, Suction available and Patient being monitored Patient Re-evaluated:Patient Re-evaluated prior to inductionOxygen Delivery Method: Nasal cannula Intubation Type: IV induction Dental Injury: Teeth and Oropharynx as per pre-operative assessment  Comments: Nasal cannula with etCO2 monitoring

## 2016-12-25 ENCOUNTER — Encounter: Payer: Self-pay | Admitting: Gastroenterology

## 2016-12-26 ENCOUNTER — Other Ambulatory Visit: Payer: Self-pay

## 2016-12-26 ENCOUNTER — Telehealth: Payer: Self-pay | Admitting: Gastroenterology

## 2016-12-26 DIAGNOSIS — K649 Unspecified hemorrhoids: Secondary | ICD-10-CM

## 2016-12-26 LAB — SURGICAL PATHOLOGY

## 2016-12-26 MED ORDER — PRAMOXINE-HC 1-1 % EX CREA: TOPICAL_CREAM | Freq: Three times a day (TID) | CUTANEOUS | 1 refills | Status: DC

## 2016-12-26 NOTE — Telephone Encounter (Signed)
Patient was released from the hospital and needs some advise on the foam she was told to use. Pramoxine Hydrochloride 1%

## 2017-01-11 ENCOUNTER — Ambulatory Visit: Payer: Medicare Other | Admitting: Radiation Oncology

## 2017-01-15 ENCOUNTER — Other Ambulatory Visit: Payer: Self-pay

## 2017-01-15 ENCOUNTER — Telehealth: Payer: Self-pay

## 2017-01-15 DIAGNOSIS — R110 Nausea: Secondary | ICD-10-CM

## 2017-01-15 DIAGNOSIS — K649 Unspecified hemorrhoids: Secondary | ICD-10-CM

## 2017-01-15 MED ORDER — SUCRALFATE 1 G PO TABS: 1.0000 g | ORAL_TABLET | Freq: Three times a day (TID) | ORAL | 0 refills | Status: DC

## 2017-01-15 NOTE — Telephone Encounter (Signed)
-----   Message from [name], MD sent at 01/14/2017 12:24 PM EDT ----- [name]  Can we refer her to colorectal surgery for evaluation of hemorrhoids  Kiran   C/ C Dr Aundra Dubin  ----- Message ----- From: McLean-Scocuzza, Nino Glow, MD Sent: 01/12/2017  12:07 PM To: [name], MD  Ok already been doing sitz baths for months   Thanks Olivia Mackie  ----- Message ----- From: [name], MD Sent: 01/12/2017   8:02 AM To: Nino Glow McLean-Scocuzza, MD  Good morning   Yes we can try carafate and if still has nausea we can even try reglan.  In terms of hemorroids- options are daily sitz bath , avoid constipation- use stool softners, if still no better then will I can refer to colorectal surgery for banding of hemorroids  Regards  Kiran  ----- Message ----- From: McLean-Scocuzza, Nino Glow, MD Sent: 01/09/2017  12:49 PM To: [name], MD  Hi   This is our mutual pt.  1. She is still having nausea after EGD/colonoscopy. I d/c'ed Zofran 4 mg tid prn and changed to phenerghan 12.5-25 mg qd  -Would you consider carafate? F/U with you is 7/19  2. Hemorrhoids are still bothering her with pramoxine/HC and sitzs baths and proctofoam is painful to insert     Thanks Olivia Mackie McLean-Scocuzza

## 2017-01-15 NOTE — Telephone Encounter (Signed)
Advised patient per Dr. Vicente Males.  See previous notes to chart concerning calls, appt, Rx.

## 2017-01-15 NOTE — Telephone Encounter (Signed)
Advised patient of communication between Dr. Aundra Dubin and Dr. Vicente Males concerning patient's nausea and hemorrhoids.   Sent referral to Dr. [name] @ [facility] for evaluation of hemorrhoids.   Sent Rx for carafate to pharmacy due to patient stating continued nausea and stomach upset.

## 2017-01-24 ENCOUNTER — Ambulatory Visit (INDEPENDENT_AMBULATORY_CARE_PROVIDER_SITE_OTHER): Payer: Medicare Other | Admitting: Surgery

## 2017-01-24 ENCOUNTER — Encounter: Payer: Self-pay | Admitting: Surgery

## 2017-01-24 ENCOUNTER — Telehealth: Payer: Self-pay

## 2017-01-24 VITALS — BP 121/80 | HR 106 | Temp 98.4°F | Ht 66.0 in | Wt 169.4 lb

## 2017-01-24 DIAGNOSIS — K602 Anal fissure, unspecified: Secondary | ICD-10-CM | POA: Diagnosis not present

## 2017-01-24 NOTE — Patient Instructions (Signed)
Please pick up your medicine at [facility].  You will need to put a small amount on your finger and insert it into your rectum.  Please clean your self after each bowel movement.  Please take sitz baths twice daily before applying the medication.  Please see your follow up appointment listed below.

## 2017-01-24 NOTE — Progress Notes (Signed)
Surgical Consultation  01/24/2017  Abigail Ortega is an 66 y.o. female.   Chief Complaint  Patient presents with  . New Patient (Initial Visit)    Hemorrhoids   HPI: 66 yo female seen in consultation at the request of Dr.Dr Aundra Dubin. She reports intermittent anorectal pain moderate in intensity. Worsening with certain positions. She reports pain be his being burning sensation. She has been doing sitz baths for several months has increase her fiber in her diet. She has had an EGD and colonoscopy showing no major anorectal disease. I have personally reviewed the images. He also complains of nausea and some weight loss. These issues have been addressed by her primary care doctor. She denies any hematochezia.     Past Medical History:  Diagnosis Date  . Arthritis   . Breast cancer [facility]) 2014   Right breast cancer - chemo, radiation and Mastectomy  . CHF (congestive heart failure) ([place]) 2013  . Clotting disorder ([place])    blood clots  . GI bleed   . Heart attack [facility]) 2012   coronary stent x2 completed by Dr. Clayborn Bigness.  Marland Kitchen Heart attack ([place]) Apr 29, 2013  . Hemorrhoids   . Hypertension   . IBS (irritable bowel syndrome)   . Malignant neoplasm of lower-inner quadrant of female breast [facility]) January 20, 2013.   invasive mammary cancer, minimal 0.85 cm.  Histologic grade 1.  . Personal history of chemotherapy 2014   BREAST CA  . Personal history of radiation therapy 2014   BREAST CA  . Thyroid disease     Past Surgical History:  Procedure Laterality Date  . BREAST SURGERY Right 02-21-2013   right mastectomy  . CARDIAC CATHETERIZATION    . COLONOSCOPY W/ BIOPSIES  09/15/2010   Colonoscopy completed by Verdie Shire, M.D. Tubular adenoma of the ascending colon and descending colon reported up to 0.4 cm in diameter. No atypia.  . COLONOSCOPY WITH PROPOFOL N/A 12/23/2016   Procedure: COLONOSCOPY WITH PROPOFOL;  Surgeon: Wilford Corner, MD;  Location: [facility] ENDOSCOPY;  Service: Endoscopy;   Laterality: N/A;  . ESOPHAGOGASTRODUODENOSCOPY N/A 12/13/2016   Procedure: ESOPHAGOGASTRODUODENOSCOPY (EGD);  Surgeon: [name], MD;  Location: [facility] ENDOSCOPY;  Service: Endoscopy;  Laterality: N/A;  . MASTECTOMY Right 2014   BREAST CA  . UPPER GI ENDOSCOPY  09/15/2010     Completed by Verdie Shire, M.D. for nausea. Normal exam reported.  Marland Kitchen VASCULAR SURGERY      Family History  Problem Relation Age of Onset  . Cancer Mother 19       breast  . Breast cancer Mother 42  . Cancer Father        colon    Social History:  reports that she has quit smoking. She quit after 20.00 years of use. She has never used smokeless tobacco. She reports that she does not drink alcohol or use drugs.  Allergies: No Known Allergies  Medications reviewed.   ROS Full ROS performed and is otherwise negative other than what is stated in the HPI   BP 121/80   Pulse (!) 106   Temp 98.4 F (36.9 C) (Oral)   Ht 5\' 6"  (1.676 m)   Wt 76.8 kg (169 lb 6.4 oz)   BMI 27.34 kg/m   Physical Exam  Constitutional: She is oriented to person, place, and time and well-developed, well-nourished, and in no distress. No distress.  Neck: Normal range of motion. No JVD present.  Pulmonary/Chest: Effort normal. No stridor. No respiratory distress.  Abdominal: Soft. She exhibits no distension. There is no tenderness. There is no rebound and no guarding.  Genitourinary:  Genitourinary Comments: There is a post midline fissure, no masses, digital exam exquisitely tender, no evidence of hemorrhoids.  Musculoskeletal: Normal range of motion. She exhibits no edema.  Neurological: She is alert and oriented to person, place, and time. Gait normal. GCS score is 15.  Skin: Skin is warm and dry. She is not diaphoretic.  Psychiatric: Mood, memory, affect and judgment normal.    Assessment/Plan: Anal fissure. Discussed with the patient about nifedipine cream twice a day. We'll DC any topical steroids. Continue sitz baths, stool  softener and increasing water intake. Follow-up in 3 weeks. May need EUA and Botox injection if sxs persist.  Caroleen Hamman, MD [facility] General Surgeon

## 2017-01-24 NOTE — Telephone Encounter (Signed)
Spoke with Helene Kelp @ [place]. Nifedipine cream has been called in. She will notify patient when ready for pick up.

## 2017-02-01 ENCOUNTER — Ambulatory Visit: Payer: Medicare Other | Admitting: Gastroenterology

## 2017-02-06 ENCOUNTER — Ambulatory Visit: Payer: Medicare Other | Admitting: Oncology

## 2017-02-06 ENCOUNTER — Other Ambulatory Visit: Payer: Medicare Other

## 2017-02-15 ENCOUNTER — Other Ambulatory Visit: Payer: Self-pay | Admitting: Gastroenterology

## 2017-02-15 DIAGNOSIS — R110 Nausea: Secondary | ICD-10-CM

## 2017-02-21 ENCOUNTER — Inpatient Hospital Stay: Payer: Medicare Other

## 2017-02-21 ENCOUNTER — Other Ambulatory Visit: Payer: Self-pay

## 2017-02-21 ENCOUNTER — Ambulatory Visit
Admission: RE | Admit: 2017-02-21 | Discharge: 2017-02-21 | Disposition: A | Discharge status: Home / Self Care | Payer: Medicare Other | Source: Ambulatory Visit | Attending: Radiation Oncology | Admitting: Radiation Oncology

## 2017-02-21 ENCOUNTER — Inpatient Hospital Stay (HOSPITAL_BASED_OUTPATIENT_CLINIC_OR_DEPARTMENT_OTHER): Payer: Medicare Other | Admitting: Oncology

## 2017-02-21 ENCOUNTER — Inpatient Hospital Stay: Payer: Medicare Other | Attending: Oncology

## 2017-02-21 VITALS — BP 126/85 | HR 99 | Temp 98.8°F | Resp 18 | Wt 170.4 lb

## 2017-02-21 VITALS — BP 122/78 | HR 94 | Temp 97.7°F | Resp 18 | Wt 170.6 lb

## 2017-02-21 DIAGNOSIS — F419 Anxiety disorder, unspecified: Secondary | ICD-10-CM

## 2017-02-21 DIAGNOSIS — K5900 Constipation, unspecified: Secondary | ICD-10-CM

## 2017-02-21 DIAGNOSIS — Z79811 Long term (current) use of aromatase inhibitors: Secondary | ICD-10-CM | POA: Insufficient documentation

## 2017-02-21 DIAGNOSIS — Z79899 Other long term (current) drug therapy: Secondary | ICD-10-CM | POA: Insufficient documentation

## 2017-02-21 DIAGNOSIS — R110 Nausea: Secondary | ICD-10-CM

## 2017-02-21 DIAGNOSIS — Z95828 Presence of other vascular implants and grafts: Secondary | ICD-10-CM

## 2017-02-21 DIAGNOSIS — M810 Age-related osteoporosis without current pathological fracture: Secondary | ICD-10-CM | POA: Diagnosis not present

## 2017-02-21 DIAGNOSIS — Z923 Personal history of irradiation: Secondary | ICD-10-CM

## 2017-02-21 DIAGNOSIS — R51 Headache: Secondary | ICD-10-CM

## 2017-02-21 DIAGNOSIS — H9319 Tinnitus, unspecified ear: Secondary | ICD-10-CM

## 2017-02-21 DIAGNOSIS — M1990 Unspecified osteoarthritis, unspecified site: Secondary | ICD-10-CM | POA: Diagnosis not present

## 2017-02-21 DIAGNOSIS — Z9011 Acquired absence of right breast and nipple: Secondary | ICD-10-CM | POA: Diagnosis not present

## 2017-02-21 DIAGNOSIS — C50911 Malignant neoplasm of unspecified site of right female breast: Secondary | ICD-10-CM | POA: Insufficient documentation

## 2017-02-21 DIAGNOSIS — Z87891 Personal history of nicotine dependence: Secondary | ICD-10-CM | POA: Insufficient documentation

## 2017-02-21 DIAGNOSIS — G4700 Insomnia, unspecified: Secondary | ICD-10-CM | POA: Insufficient documentation

## 2017-02-21 DIAGNOSIS — Z452 Encounter for adjustment and management of vascular access device: Secondary | ICD-10-CM | POA: Diagnosis not present

## 2017-02-21 DIAGNOSIS — I509 Heart failure, unspecified: Secondary | ICD-10-CM

## 2017-02-21 DIAGNOSIS — Z7982 Long term (current) use of aspirin: Secondary | ICD-10-CM | POA: Insufficient documentation

## 2017-02-21 DIAGNOSIS — K602 Anal fissure, unspecified: Secondary | ICD-10-CM | POA: Insufficient documentation

## 2017-02-21 DIAGNOSIS — M791 Myalgia: Secondary | ICD-10-CM

## 2017-02-21 DIAGNOSIS — I110 Hypertensive heart disease with heart failure: Secondary | ICD-10-CM | POA: Diagnosis not present

## 2017-02-21 DIAGNOSIS — Z170 Estrogen receptor positive status [ER+]: Secondary | ICD-10-CM | POA: Diagnosis not present

## 2017-02-21 DIAGNOSIS — K589 Irritable bowel syndrome without diarrhea: Secondary | ICD-10-CM | POA: Insufficient documentation

## 2017-02-21 DIAGNOSIS — Z9221 Personal history of antineoplastic chemotherapy: Secondary | ICD-10-CM | POA: Insufficient documentation

## 2017-02-21 DIAGNOSIS — E079 Disorder of thyroid, unspecified: Secondary | ICD-10-CM | POA: Insufficient documentation

## 2017-02-21 DIAGNOSIS — C50311 Malignant neoplasm of lower-inner quadrant of right female breast: Secondary | ICD-10-CM | POA: Diagnosis not present

## 2017-02-21 DIAGNOSIS — Z955 Presence of coronary angioplasty implant and graft: Secondary | ICD-10-CM | POA: Diagnosis not present

## 2017-02-21 DIAGNOSIS — I252 Old myocardial infarction: Secondary | ICD-10-CM

## 2017-02-21 MED ORDER — HEPARIN SOD (PORK) LOCK FLUSH 100 UNIT/ML IV SOLN
500.0000 [IU] | Freq: Once | INTRAVENOUS | Status: AC
  Administered 2017-02-21: 500 [IU] via INTRAVENOUS

## 2017-02-21 MED ORDER — SODIUM CHLORIDE 0.9% FLUSH
10.0000 mL | INTRAVENOUS | Status: DC | PRN
  Administered 2017-02-21: 10 mL via INTRAVENOUS
  Filled 2017-02-21: qty 10

## 2017-02-21 NOTE — Progress Notes (Signed)
[place]  [phone] [phone]  ID: Abigail Ortega OB: May 19, 1951  MR#: [id number]  UDJ#:[id number]  Patient Care Team: Perrin Maltese, MD as PCP - General (Internal Medicine) Bary Castilla, Forest Gleason, MD (General Surgery) Sharene Butters, MD (General Surgery)  CHIEF COMPLAINT: Pathologic stage IIIa triple positive invasive carcinoma of the lower inner quadrant of the right breast.   INTERVAL HISTORY: Patient returns to clinic today for routine 6 month evaluation. She continues to have multiple medical complaints that are chronic in nature. She continues to tolerate letrozole without significant side effects. She has no neurologic complaints. She denies any recent fevers. She has no chest pain or shortness of breath. She denies any nausea, vomiting, constipation, or diarrhea. She has no urinary complaints.  Patient offers no further specific complaints today.  REVIEW OF SYSTEMS:   Review of Systems  Constitutional: Negative for chills, fever, malaise/fatigue and weight loss.  HENT: Positive for tinnitus.   Eyes: Negative.   Respiratory: Negative.  Negative for cough and sputum production.   Cardiovascular: Negative.  Negative for chest pain.  Gastrointestinal: Positive for constipation and nausea. Negative for abdominal pain, diarrhea and vomiting.  Genitourinary: Negative.   Musculoskeletal: Positive for myalgias.  Skin: Negative for itching.  Neurological: Positive for headaches. Negative for sensory change and weakness.  Psychiatric/Behavioral: The patient is nervous/anxious and has insomnia.     As per HPI. Otherwise, a complete review of systems is negative.  PAST MEDICAL HISTORY: Past Medical History:  Diagnosis Date  . Arthritis   . Breast cancer [facility]) 2014   Right breast cancer - chemo, radiation and Mastectomy  . CHF (congestive heart failure) ([place]) 2013  . Clotting disorder ([place])    blood clots  . GI bleed   . Heart attack  [facility]) 2012   coronary stent x2 completed by Dr. Clayborn Bigness.  Marland Kitchen Heart attack ([place]) Apr 29, 2013  . Hemorrhoids   . Hypertension   . IBS (irritable bowel syndrome)   . Malignant neoplasm of lower-inner quadrant of female breast [facility]) January 20, 2013.   invasive mammary cancer, minimal 0.85 cm.  Histologic grade 1.  . Personal history of chemotherapy 2014   BREAST CA  . Personal history of radiation therapy 2014   BREAST CA  . Thyroid disease     PAST SURGICAL HISTORY: Past Surgical History:  Procedure Laterality Date  . BREAST SURGERY Right 02-21-2013   right mastectomy  . CARDIAC CATHETERIZATION    . COLONOSCOPY W/ BIOPSIES  09/15/2010   Colonoscopy completed by Verdie Shire, M.D. Tubular adenoma of the ascending colon and descending colon reported up to 0.4 cm in diameter. No atypia.  . COLONOSCOPY WITH PROPOFOL N/A 12/23/2016   Procedure: COLONOSCOPY WITH PROPOFOL;  Surgeon: Wilford Corner, MD;  Location: [facility] ENDOSCOPY;  Service: Endoscopy;  Laterality: N/A;  . ESOPHAGOGASTRODUODENOSCOPY N/A 12/13/2016   Procedure: ESOPHAGOGASTRODUODENOSCOPY (EGD);  Surgeon: [name], MD;  Location: [facility] ENDOSCOPY;  Service: Endoscopy;  Laterality: N/A;  . MASTECTOMY Right 2014   BREAST CA  . UPPER GI ENDOSCOPY  09/15/2010     Completed by Verdie Shire, M.D. for nausea. Normal exam reported.  Marland Kitchen VASCULAR SURGERY      FAMILY HISTORY Family History  Problem Relation Age of Onset  . Cancer Mother 51       breast  . Breast cancer Mother 81  . Cancer Father        colon       ADVANCED DIRECTIVES:  HEALTH MAINTENANCE: Social History  Substance Use Topics  . Smoking status: Former Smoker    Years: 20.00  . Smokeless tobacco: Never Used  . Alcohol use No     Colonoscopy:  PAP:  Bone density:  Lipid panel:  No Known Allergies  Current Outpatient Prescriptions  Medication Sig Dispense Refill  . acetaminophen (TYLENOL 8 HOUR ARTHRITIS PAIN) 650 MG CR tablet Take 650 mg by mouth every 8  (eight) hours as needed for pain.    Marland Kitchen ALPRAZolam (XANAX) 0.25 MG tablet Take 0.25 mg by mouth at bedtime as needed.   2  . amLODipine (NORVASC) 10 MG tablet     . aspirin EC 81 MG tablet Take 81 mg by mouth daily.    Marland Kitchen atorvastatin (LIPITOR) 40 MG tablet Take 1 tablet by mouth once daily    . Calcium 600-200 MG-UNIT tablet Take 1 tablet by mouth daily.    . Cholecalciferol 1000 units tablet Take 1,000 Units by mouth daily.    . clopidogrel (PLAVIX) 75 MG tablet clopidogrel 75 mg tablet    . fluticasone (FLONASE) 50 MCG/ACT nasal spray     . hydrocortisone (ANUSOL-HC) 25 MG suppository Place 1 suppository (25 mg total) rectally 2 (two) times daily. 12 suppository 1  . letrozole (FEMARA) 2.5 MG tablet TAKE 1 TABLET BY MOUTH  DAILY 90 tablet 2  . levothyroxine (SYNTHROID, LEVOTHROID) 75 MCG tablet Take 75 mcg by mouth daily before breakfast.    . Magnesium 200 MG TABS Take 1 tablet by mouth daily.    . montelukast (SINGULAIR) 10 MG tablet     . Multiple Vitamin (MULTIVITAMIN) tablet Take 1 tablet by mouth daily.    . Olopatadine HCl 0.2 % SOLN INSTILL 1 DROP IN EACH EYE DAILY  2  . ondansetron (ZOFRAN) 4 MG tablet TAKE 1 TABLET BY MOUTH THREE TIMES A DAY AS NEEDED FOR NAUSEA  1  . pantoprazole (PROTONIX) 40 MG tablet Take 1 tablet (40 mg total) by mouth 2 (two) times daily. (Patient taking differently: Take 40 mg by mouth daily. ) 30 tablet 0  . pramoxine (PROCTOFOAM) 1 % foam Place rectally 3 (three) times daily as needed for itching. 15 g 0  . pramoxine-hydrocortisone (ANALPRAM HC) cream Apply topically 3 (three) times daily. 30 g 1  . PROAIR HFA 108 (90 Base) MCG/ACT inhaler 1-2 PUFFS EVERY 4-6 HOURS AS NEEEDED COUGH, FOR SHORTNESS OF BREATH , WHEEZING  5  . promethazine (PHENERGAN) 12.5 MG tablet TAKE 1-2 TABLETS BY MOUTH 3 TIMES DAILY AS NEEDED FOR NAUSEA  0  . sucralfate (CARAFATE) 1 g tablet TAKE 1 TABLET (1 G TOTAL) BY MOUTH 4 (FOUR) TIMES DAILY - WITH MEALS AND AT BEDTIME. 120 tablet 0    . temazepam (RESTORIL) 30 MG capsule     . traZODone (DESYREL) 50 MG tablet Take 100 mg by mouth at bedtime.     Marland Kitchen venlafaxine XR (EFFEXOR-XR) 150 MG 24 hr capsule venlafaxine ER 150 mg capsule,extended release 24 hr     No current facility-administered medications for this visit.    Facility-Administered Medications Ordered in Other Visits  Medication Dose Route Frequency Provider Last Rate Last Dose  . sodium chloride 0.9 % injection 10 mL  10 mL Intravenous PRN Lloyd Huger, MD   10 mL at 02/24/15 0951  . sodium chloride flush (NS) 0.9 % injection 10 mL  10 mL Intravenous PRN Evlyn Kanner, NP   10 mL at 01/27/16 1446  OBJECTIVE: Vitals:   02/22/17 0829  BP: 122/78  Pulse: 94  Resp: 18  Temp: 97.7 F (36.5 C)     Body mass index is 27.54 kg/m.    ECOG FS:0 - Asymptomatic  General: Well-developed, well-nourished, no acute distress. Eyes: Pink conjunctiva, anicteric sclera. Breasts: Patient declined breast exam today. Lungs: Clear to auscultation bilaterally. Heart: Regular rate and rhythm. No rubs, murmurs, or gallops. Abdomen: Soft, nontender, nondistended. No organomegaly noted, normoactive bowel sounds. Musculoskeletal: No edema, cyanosis, or clubbing. Neuro: Alert, answering all questions appropriately. Cranial nerves grossly intact. Skin: No rashes or petechiae noted. Psych: Normal affect.  LAB RESULTS:  Lab Results  Component Value Date   NA 135 12/23/2016   K 4.1 12/23/2016   CL 102 12/23/2016   CO2 26 12/23/2016   GLUCOSE 89 12/23/2016   BUN 8 12/23/2016   CREATININE 1.17 (H) 12/23/2016   CALCIUM 9.3 12/23/2016   PROT 6.0 (L) 12/22/2016   ALBUMIN 3.3 (L) 12/22/2016   AST 18 12/22/2016   ALT 15 12/22/2016   ALKPHOS 76 12/22/2016   BILITOT 0.8 12/22/2016   GFRNONAA 47 (L) 12/23/2016   GFRAA 55 (L) 12/23/2016    Lab Results  Component Value Date   WBC 4.4 12/22/2016   NEUTROABS 5.1 11/02/2016   HGB 9.6 (L) 12/22/2016   HCT 27.7 (L)  12/22/2016   MCV 93.2 12/22/2016   PLT 283 12/22/2016     STUDIES: No results found.  ASSESSMENT: Pathologic stage IIIa triple positive invasive carcinoma of the lower inner quadrant of the right breast.   PLAN:    1. Pathologic stage IIIa triple positive invasive carcinoma of the lower inner quadrant of the right breast: Patient has now completed all of her treatments including XRT. She only completed 14 of 18 infusions of Herceptin secondary to persistent nausea. Her CA 27.29 continues to be within normal limits. Continue letrozole completing 5 years of treatment in October 2020. Patient's most recent mammogram on October 10, 2016 was reported as BI-RADS 1. Repeat in March 2019.  Return to clinic in 6 months for laboratory work and further evaluation. 2. Osteoporosis:  Bone mineral density on September 19, 2016 reported a T score of -2.4 which is slightly worse than from December 2016 where T score was reported as -2.2. Patient was instructed to continue calcium and vitamin D supplementation. Repeat bone mineral density due in December 2017 not yet completed, ordered today. 3.  Anxiety: Continue Xanax as needed.  4.  Headaches/tinnitus: Ttreatment per neurology.   Patient expressed understanding and was in agreement with this plan. She also understands that She can call clinic at any time with any questions, concerns, or complaints.   Breast cancer of lower-inner quadrant of right female breast   Staging form: Breast, AJCC 7th Edition     Pathologic stage from 02/24/2015: Stage IIIA (T1c, N2b, cM0) - Signed by Lloyd Huger, MD on 02/24/2015   Lloyd Huger, MD 02/25/17 8:27 AM

## 2017-02-22 ENCOUNTER — Encounter: Payer: Self-pay | Admitting: Radiation Oncology

## 2017-02-22 LAB — CA 27.29 (SERIAL MONITOR): CA 27.29: 31.1 U/mL (ref 0.0–38.6)

## 2017-02-22 NOTE — Progress Notes (Signed)
Radiation Oncology Follow up Note  Name: Abigail Ortega   Date:   02/21/2017 MRN:  [id number] DOB: 1951-06-21    This 66 y.o. female presents to the clinic today for 3 year follow-up status post right chest wall and peripheral lymphatic radiation status post adjuvant chemotherapy plus Herceptin for ER/PR positive HER-2/neu overexpressed invasive mammary carcinoma of the right breast.  REFERRING PROVIDER: Perrin Maltese, MD  HPI: patient is a 66 year old female now seen out 3 years having completed both chemotherapy as well as adjuvant chest wall peripheral lymphatic radiation status post right modified radical mastectomy for stage IIIa ER/PR positive HER-2/neu overexpressed invasive mammary carcinoma. She is seen today in routine follow-up and is doing fairly well she continues to have a problem with an anal fissure is scheduled to his see the surgeon next week. She specifically denies any chest wall mass or nodularity or any swelling of her right upper extremity..she is currently on letrozole tolerating that well without side effect.follow-up mammograms have been ordered by medical oncology.  COMPLICATIONS OF TREATMENT: none  FOLLOW UP COMPLIANCE: keeps appointments   PHYSICAL EXAM:  BP 126/85   Pulse 99   Temp 98.8 F (37.1 C)   Resp 18   Wt 170 lb 6.7 oz (77.3 kg)   BMI 27.51 kg/m  Patient is status post right modified radical mastectomy. Chest walls clear without evidence of mass or nodularity. Left breast is free of dominant mass or nodularity in 2 positions examined. No axillary or supraclavicular adenopathy is noted. No evidence of lymphedema of her right upper extremity is noted. Well-developed well-nourished patient in NAD. HEENT reveals PERLA, EOMI, discs not visualized.  Oral cavity is clear. No oral mucosal lesions are identified. Neck is clear without evidence of cervical or supraclavicular adenopathy. Lungs are clear to A&P. Cardiac examination is essentially unremarkable  with regular rate and rhythm without murmur rub or thrill. Abdomen is benign with no organomegaly or masses noted. Motor sensory and DTR levels are equal and symmetric in the upper and lower extremities. Cranial nerves II through XII are grossly intact. Proprioception is intact. No peripheral adenopathy or edema is identified. No motor or sensory levels are noted. Crude visual fields are within normal range.  RADIOLOGY RESULTS: no current mammograms for review  PLAN: present time she continues to do well with no evidence of disease. I'm please were overall progress. I've asked to see her back in 1 year for follow-up. She knows to call with any concerns. She continues close follow-up care with medical oncology. She continues on letrozole.  I would like to take this opportunity to thank you for allowing me to participate in the care of your patient.Armstead Peaks., MD

## 2017-02-26 ENCOUNTER — Other Ambulatory Visit: Payer: Self-pay

## 2017-02-28 ENCOUNTER — Encounter: Payer: Self-pay | Admitting: Surgery

## 2017-02-28 ENCOUNTER — Ambulatory Visit (INDEPENDENT_AMBULATORY_CARE_PROVIDER_SITE_OTHER): Payer: Medicare Other | Admitting: Surgery

## 2017-02-28 ENCOUNTER — Telehealth: Payer: Self-pay

## 2017-02-28 VITALS — BP 118/79 | HR 89 | Temp 98.1°F | Ht 66.0 in | Wt 170.8 lb

## 2017-02-28 DIAGNOSIS — K602 Anal fissure, unspecified: Secondary | ICD-10-CM | POA: Diagnosis not present

## 2017-02-28 MED ORDER — ONABOTULINUMTOXINA 100 UNITS IJ SOLR: 100.0000 [IU] | INTRAMUSCULAR | Status: AC

## 2017-02-28 NOTE — Patient Instructions (Signed)
We have seen you today for an Anal Fissure. Please read the information below in regards to your diagnosis.  We will do your surgery on 03/14/17 with Dr. [name] at [facility]. We will be doing a botox injection in your anorectal area to help with pain from your fissure. We will request clearance to take you off of your Plavix from Dr. Clayborn Bigness. I will call you if he would like anything differently done regarding your medication.  Please begin the medication sent to [facility]. [facility] is located at [address] in [place]. This is the only pharmacy that will compound this particular medication.  We will want you to begin doing Sitz Baths three times daily at the minimum and after every bowel movement. It is extremely important to keep this area as clean as possible. I have included instructions for how to take a sitz bath, below.  If you have any questions or concerns prior to this appointment, please call our office and speak with a nurse.  Anal Fissure, Adult An anal fissure is a small tear or crack in the skin around the anus. Bleeding from a fissure usually stops on its own within a few minutes. However, bleeding will often occur again with each bowel movement until the crack heals. What are the causes? This condition may be caused by:  Passing large, hard stool (feces).  Frequent diarrhea.  Constipation.  Inflammatory bowel disease (Crohn disease or ulcerative colitis).  Infections.  Anal sex.  What are the signs or symptoms? Symptoms of this condition include:  Bleeding from the rectum.  Small amounts of blood seen on your stool, on toilet paper, or in the toilet after a bowel movement.  Painful bowel movements.  Itching or irritation around the anus.  How is this diagnosed? A health care provider may diagnose this condition by closely examining the anal area. An anal fissure can usually be seen with careful inspection. In some cases, a rectal exam may be performed,  or a short tube (anoscope) may be used to examine the anal canal. How is this treated? Treatment for this condition may include:  Taking steps to avoid constipation. This may include making changes to your diet, such as increasing your intake of fiber or fluid.  Taking fiber supplements. These supplements can soften your stool to help make bowel movements easier. Your health care provider may also prescribe a stool softener if your stool is often hard.  Taking sitz baths. This may help to heal the tear.  Using medicated creams or ointments. These may be prescribed to lessen discomfort.  Follow these instructions at home: Eating and drinking  Avoid foods that may be constipating, such as bananas and dairy products.  Drink enough fluid to keep your urine clear or pale yellow.  Maintain a diet that is high in fruits, whole grains, and vegetables. General instructions  Keep the anal area as clean and dry as possible.  Take sitz baths as told by your health care provider. Do not use soap in the sitz baths.  Take over-the-counter and prescription medicines only as told by your health care provider.  Use creams or ointments only as told by your health care provider.  Keep all follow-up visits as told by your health care provider. This is important. Contact a health care provider if:  You have more bleeding.  You have a fever.  You have diarrhea that is mixed with blood.  You continue to have pain.  Your problem is getting  worse rather than better. This information is not intended to replace advice given to you by your health care provider. Make sure you discuss any questions you have with your health care provider. Document Released: 07/03/2005 Document Revised: 11/10/2015 Document Reviewed: 09/28/2014 Elsevier Interactive Patient Education  2018 [employer].   How to Take a [employer] A sitz bath is a warm water bath that is taken while you are sitting down. The water  should only come up to your hips and should cover your buttocks. Your health care provider may recommend a sitz bath to help you:  Clean the lower part of your body, including your genital area.  With itching.  With pain.  With sore muscles or muscles that tighten or spasm.  How to take a sitz bath Take 3-4 sitz baths per day or as told by your health care provider. 1. Partially fill a bathtub with warm water. You will only need the water to be deep enough to cover your hips and buttocks when you are sitting in it. 2. If your health care provider told you to put medicine in the water, follow the directions exactly. 3. Sit in the water and open the tub drain a little. 4. Turn on the warm water again to keep the tub at the correct level. Keep the water running constantly. 5. Soak in the water for 15-20 minutes or as told by your health care provider. 6. After the sitz bath, pat the affected area dry first. Do not rub it. 7. Be careful when you stand up after the sitz bath because you may feel dizzy.  Contact a health care provider if:  Your symptoms get worse. Do not continue with sitz baths if your symptoms get worse.  You have new symptoms. Do not continue with sitz baths until you talk with your health care provider. This information is not intended to replace advice given to you by your health care provider. Make sure you discuss any questions you have with your health care provider. Document Released: 03/25/2004 Document Revised: 12/01/2015 Document Reviewed: 07/01/2014 Elsevier Interactive Patient Education  [employer].

## 2017-02-28 NOTE — Telephone Encounter (Signed)
Cardiac and Anti-coagulant clearance for Plavix faxed to Dr. Etta Quill office at this time with positive confirmation. Awaiting response currently.  Patient's surgery has been scheduled for 03/14/17 with Dr. [name] at [facility].

## 2017-02-28 NOTE — Progress Notes (Signed)
Outpatient Surgical Follow Up  02/28/2017  Abigail Ortega is an 66 y.o. female.   Chief Complaint  Patient presents with  . Follow-up    Hemorrhoids / Anal Fissure    HPI: 66-year-old following up for anal fissures. She start nifedipine cream, sitz baths and fiber. She reports her husband been no relief after instillation of therapy. She does report some nausea but her main complaint is anal rectal pain. She feels sharp pain whenever she passes a bowel movement and feels like she is passing"razor blades". No evidence of incontinence. He does take Plavix for a previous MI. No evidence of acute exacerbation from her coronary artery disease or heart failure.  Past Medical History:  Diagnosis Date  . Arthritis   . Breast cancer (HCC) 2014   Right breast cancer - chemo, radiation and Mastectomy  . CHF (congestive heart failure) (HCC) 2013  . Clotting disorder (HCC)    blood clots  . GI bleed   . Heart attack (HCC) 2012   coronary stent x2 completed by Dr. Callwood.  . Heart attack (HCC) Apr 29, 2013  . Hemorrhoids   . Hypertension   . IBS (irritable bowel syndrome)   . Malignant neoplasm of lower-inner quadrant of female breast (HCC) January 20, 2013.   invasive mammary cancer, minimal 0.85 cm.  Histologic grade 1.  . Personal history of chemotherapy 2014   BREAST CA  . Personal history of radiation therapy 2014   BREAST CA  . Thyroid disease     Past Surgical History:  Procedure Laterality Date  . BREAST SURGERY Right 02-21-2013   right mastectomy  . CARDIAC CATHETERIZATION    . COLONOSCOPY W/ BIOPSIES  09/15/2010   Colonoscopy completed by Paul Oh, M.D. Tubular adenoma of the ascending colon and descending colon reported up to 0.4 cm in diameter. No atypia.  . COLONOSCOPY WITH PROPOFOL N/A 12/23/2016   Procedure: COLONOSCOPY WITH PROPOFOL;  Surgeon: Schooler, Vincent, MD;  Location: [facility] ENDOSCOPY;  Service: Endoscopy;  Laterality: N/A;  . ESOPHAGOGASTRODUODENOSCOPY N/A  12/13/2016   Procedure: ESOPHAGOGASTRODUODENOSCOPY (EGD);  Surgeon: Anna, Kiran, MD;  Location: [facility] ENDOSCOPY;  Service: Endoscopy;  Laterality: N/A;  . MASTECTOMY Right 2014   BREAST CA  . UPPER GI ENDOSCOPY  09/15/2010     Completed by Paul Oh, M.D. for nausea. Normal exam reported.  . VASCULAR SURGERY      Family History  Problem Relation Age of Onset  . Cancer Mother 70       breast  . Breast cancer Mother 65  . Cancer Father        colon    Social History:  reports that she has quit smoking. She quit after 20.00 years of use. She has never used smokeless tobacco. She reports that she does not drink alcohol or use drugs.  Allergies: No Known Allergies  Medications reviewed.  ROS Full ROS performed and is otherwise negative other than what is stated in HPI   BP 118/79   Pulse 89   Temp 98.1 F (36.7 C) (Oral)   Ht 5' 6" (1.676 m)   Wt 77.5 kg (170 lb 12.8 oz)   BMI 27.57 kg/m    Physical Exam  Constitutional: She is oriented to person, place, and time and well-developed, well-nourished, and in no distress. No distress.  Neck: No JVD present.  Cardiovascular: Normal rate and intact distal pulses.   Pulmonary/Chest: Effort normal. No stridor. No respiratory distress. She exhibits no tenderness.  Abdominal: Soft.   She exhibits no distension. There is no tenderness. There is no rebound and no guarding.  Genitourinary:  Genitourinary Comments: Recta: posterior midline, no masses, no hemorrhoids , no abscess  Musculoskeletal: Normal range of motion. She exhibits no edema or deformity.  Neurological: She is alert and oriented to person, place, and time. Gait normal. GCS score is 15.  Skin: Skin is warm and dry. She is not diaphoretic.  Psychiatric: Mood, memory, affect and judgment normal.    Assessment/Plan: Anal fissure nonresponsive to medical management. Discussed with the patient in detail about therapeutic options to include chemical versus surgical  sphincterotomy. She wishes to proceed with chemical sphincterotomy. Discussed with the patient in detail about the procedure. Risks, benefits and possible complications including but not limited to: Bleeding, infection, recurrence, incontinence. She understands and she wishes to proceed. He needs to be off Plavix for at least a week   Diego Pabon, MD FACS General Surgeon 

## 2017-02-28 NOTE — Telephone Encounter (Signed)
Medication called in to [facility] at this time. Added to med list.

## 2017-03-01 ENCOUNTER — Telehealth: Payer: Self-pay | Admitting: Surgery

## 2017-03-01 NOTE — Telephone Encounter (Signed)
I have called Patient to advised her of pre op date/time and sx date. No answer. I have left a message on both voicemail's.  Sx: 03/14/17 with Dr Ginny Forth EUA with anal fissurotomy with botox Injection. Pre op: 03/07/17 @ 9:45am--Office.   Patient made aware to call [phone], between 1-3:00pm the day before surgery, to find out what time to arrive.

## 2017-03-07 ENCOUNTER — Encounter
Admission: RE | Admit: 2017-03-07 | Discharge: 2017-03-07 | Disposition: A | Discharge status: Home / Self Care | Payer: Medicare Other | Source: Ambulatory Visit | Attending: Surgery | Admitting: Surgery

## 2017-03-07 DIAGNOSIS — K602 Anal fissure, unspecified: Secondary | ICD-10-CM | POA: Diagnosis not present

## 2017-03-07 DIAGNOSIS — Z01812 Encounter for preprocedural laboratory examination: Secondary | ICD-10-CM | POA: Insufficient documentation

## 2017-03-07 HISTORY — DX: Hypothyroidism, unspecified: E03.9

## 2017-03-07 HISTORY — DX: Gastro-esophageal reflux disease without esophagitis: K21.9

## 2017-03-07 LAB — CBC
HEMATOCRIT: 32.1 % — AB (ref 35.0–47.0)
Hemoglobin: 11.1 g/dL — ABNORMAL LOW (ref 12.0–16.0)
MCH: 31.8 pg (ref 26.0–34.0)
MCHC: 34.5 g/dL (ref 32.0–36.0)
MCV: 92 fL (ref 80.0–100.0)
PLATELETS: 370 10*3/uL (ref 150–440)
RBC: 3.49 MIL/uL — AB (ref 3.80–5.20)
RDW: 13.1 % (ref 11.5–14.5)
WBC: 5.1 10*3/uL (ref 3.6–11.0)

## 2017-03-07 NOTE — Patient Instructions (Signed)
Your procedure is scheduled on: Wednesday 03/14/17 Report to [place]. 2ND FLOOR MEDICAL MALL ENTRANCE. To find out your arrival time please call [phone] between 1PM - 3PM on Tuesday 03/13/17.  Remember: Instructions that are not followed completely may result in serious medical risk, up to and including death, or upon the discretion of your surgeon and anesthesiologist your surgery may need to be rescheduled.    __X__ 1. Do not eat food or drink liquids after midnight. No gum chewing or hard candies.     __X__ 2. No Alcohol for 24 hours before or after surgery.   ____ 3. Bring all medications with you on the day of surgery if instructed.    __X__ 4. Notify your doctor if there is any change in your medical condition     (cold, fever, infections).             ___X__5. No smoking within 24 hours of your surgery.     Do not wear jewelry, make-up, hairpins, clips or nail polish.  Do not wear lotions, powders, or perfumes.   Do not shave 48 hours prior to surgery. Men may shave face and neck.  Do not bring valuables to the hospital.    [facility] is not responsible for any belongings or valuables.               Contacts, dentures or bridgework may not be worn into surgery.  Leave your suitcase in the car. After surgery it may be brought to your room.  For patients admitted to the hospital, discharge time is determined by your                treatment team.   Patients discharged the day of surgery will not be allowed to drive home.   Please read over the following fact sheets that you were given:   MRSA Information   __X__ Take these medicines the morning of surgery with A SIP OF WATER:    1. ATORVASTATIN  2. ISOSORBIDE MONONITRATE  3. LEVOTHYROXINE  4. PANTOPRAZOLE  5.  6.  ____ Fleet Enema (as directed)   ____ Use CHG Soap as directed  __X__ Use inhalers on the day of surgery  ____ Stop metformin 2 days prior to surgery    ____ Take 1/2 of usual insulin dose the  night before surgery and none on the morning of surgery.   __X__ Stop Coumadin/Plavix/aspirin on (ASK DR CALLWOOD WHEN ITS OK TO STOP YOUR PLAVIX AND ASPIRIN)  __X__ Stop Anti-inflammatories such as Advil, Aleve, Ibuprofen, Motrin, Naproxen, Naprosyn, Goodies,powder, or aspirin products.  OK to take Tylenol.   ____ Stop supplements until after surgery.    ____ Bring C-Pap to the hospital.

## 2017-03-13 ENCOUNTER — Telehealth: Payer: Self-pay

## 2017-03-13 NOTE — Telephone Encounter (Signed)
As stated in Office Note from Dr. Clayborn Bigness on 03/08/17, patient is an acceptable surgical risk for tomorrow's surgery and is to hold PLAVIX and ASA for 5 days prior to surgery.  Patient has been called and has been holding Plavix and ASA appropriately.

## 2017-03-14 ENCOUNTER — Ambulatory Visit
Admission: RE | Admit: 2017-03-14 | Discharge: 2017-03-14 | Disposition: A | Discharge status: Home / Self Care | Payer: Medicare Other | Source: Ambulatory Visit | Attending: Surgery | Admitting: Surgery

## 2017-03-14 ENCOUNTER — Encounter: Payer: Self-pay | Admitting: *Deleted

## 2017-03-14 ENCOUNTER — Encounter
Admission: RE | Disposition: A | Discharge status: Home / Self Care | Payer: Self-pay | Source: Ambulatory Visit | Attending: Surgery

## 2017-03-14 ENCOUNTER — Ambulatory Visit: Payer: Medicare Other | Admitting: Certified Registered"

## 2017-03-14 DIAGNOSIS — E039 Hypothyroidism, unspecified: Secondary | ICD-10-CM | POA: Insufficient documentation

## 2017-03-14 DIAGNOSIS — Z7902 Long term (current) use of antithrombotics/antiplatelets: Secondary | ICD-10-CM | POA: Diagnosis not present

## 2017-03-14 DIAGNOSIS — Z923 Personal history of irradiation: Secondary | ICD-10-CM | POA: Insufficient documentation

## 2017-03-14 DIAGNOSIS — I509 Heart failure, unspecified: Secondary | ICD-10-CM | POA: Diagnosis not present

## 2017-03-14 DIAGNOSIS — K219 Gastro-esophageal reflux disease without esophagitis: Secondary | ICD-10-CM | POA: Diagnosis not present

## 2017-03-14 DIAGNOSIS — Z9221 Personal history of antineoplastic chemotherapy: Secondary | ICD-10-CM | POA: Insufficient documentation

## 2017-03-14 DIAGNOSIS — Z800 Family history of malignant neoplasm of digestive organs: Secondary | ICD-10-CM | POA: Insufficient documentation

## 2017-03-14 DIAGNOSIS — D688 Other specified coagulation defects: Secondary | ICD-10-CM | POA: Insufficient documentation

## 2017-03-14 DIAGNOSIS — I252 Old myocardial infarction: Secondary | ICD-10-CM | POA: Diagnosis not present

## 2017-03-14 DIAGNOSIS — Z853 Personal history of malignant neoplasm of breast: Secondary | ICD-10-CM | POA: Diagnosis not present

## 2017-03-14 DIAGNOSIS — Z803 Family history of malignant neoplasm of breast: Secondary | ICD-10-CM | POA: Diagnosis not present

## 2017-03-14 DIAGNOSIS — I110 Hypertensive heart disease with heart failure: Secondary | ICD-10-CM | POA: Diagnosis not present

## 2017-03-14 DIAGNOSIS — Z9011 Acquired absence of right breast and nipple: Secondary | ICD-10-CM | POA: Diagnosis not present

## 2017-03-14 DIAGNOSIS — Z955 Presence of coronary angioplasty implant and graft: Secondary | ICD-10-CM | POA: Insufficient documentation

## 2017-03-14 DIAGNOSIS — M1990 Unspecified osteoarthritis, unspecified site: Secondary | ICD-10-CM | POA: Insufficient documentation

## 2017-03-14 DIAGNOSIS — K6289 Other specified diseases of anus and rectum: Secondary | ICD-10-CM | POA: Diagnosis present

## 2017-03-14 DIAGNOSIS — F418 Other specified anxiety disorders: Secondary | ICD-10-CM | POA: Insufficient documentation

## 2017-03-14 DIAGNOSIS — K589 Irritable bowel syndrome without diarrhea: Secondary | ICD-10-CM | POA: Diagnosis not present

## 2017-03-14 DIAGNOSIS — K602 Anal fissure, unspecified: Secondary | ICD-10-CM | POA: Diagnosis not present

## 2017-03-14 DIAGNOSIS — Z87891 Personal history of nicotine dependence: Secondary | ICD-10-CM | POA: Diagnosis not present

## 2017-03-14 HISTORY — PX: BOTOX INJECTION: SHX5754

## 2017-03-14 SURGERY — EXAM UNDER ANESTHESIA
Anesthesia: General | Site: Rectum | Wound class: Dirty or Infected

## 2017-03-14 MED ORDER — LIDOCAINE HCL (CARDIAC) 20 MG/ML IV SOLN
INTRAVENOUS | Status: DC | PRN
  Administered 2017-03-14: 50 mg via INTRAVENOUS

## 2017-03-14 MED ORDER — CHLORHEXIDINE GLUCONATE CLOTH 2 % EX PADS: 6.0000 | MEDICATED_PAD | Freq: Once | CUTANEOUS | Status: DC

## 2017-03-14 MED ORDER — FENTANYL CITRATE (PF) 100 MCG/2ML IJ SOLN: 25.0000 ug | INTRAMUSCULAR | Status: DC | PRN

## 2017-03-14 MED ORDER — LACTATED RINGERS IV SOLN
INTRAVENOUS | Status: DC
  Administered 2017-03-14: 09:00:00 via INTRAVENOUS

## 2017-03-14 MED ORDER — ACETAMINOPHEN 160 MG/5ML PO SOLN
325.0000 mg | ORAL | Status: DC | PRN
  Filled 2017-03-14: qty 20.3

## 2017-03-14 MED ORDER — OXYCODONE HCL 5 MG PO TABS
5.0000 mg | ORAL_TABLET | Freq: Once | ORAL | Status: AC | PRN
  Administered 2017-03-14: 5 mg via ORAL

## 2017-03-14 MED ORDER — SODIUM CHLORIDE 0.9 % IJ SOLN
INTRAMUSCULAR | Status: AC
  Filled 2017-03-14: qty 10

## 2017-03-14 MED ORDER — ACETAMINOPHEN 325 MG PO TABS: 325.0000 mg | ORAL_TABLET | ORAL | Status: DC | PRN

## 2017-03-14 MED ORDER — OXYCODONE HCL 5 MG/5ML PO SOLN: 5.0000 mg | Freq: Once | ORAL | Status: AC | PRN

## 2017-03-14 MED ORDER — PROPOFOL 10 MG/ML IV BOLUS
INTRAVENOUS | Status: DC | PRN
  Administered 2017-03-14: 20 mg via INTRAVENOUS

## 2017-03-14 MED ORDER — BUPIVACAINE-EPINEPHRINE (PF) 0.25% -1:200000 IJ SOLN
INTRAMUSCULAR | Status: AC
  Filled 2017-03-14: qty 30

## 2017-03-14 MED ORDER — ONABOTULINUMTOXINA 100 UNITS IJ SOLR
INTRAMUSCULAR | Status: DC | PRN
  Administered 2017-03-14: 100 [IU] via INTRAMUSCULAR

## 2017-03-14 MED ORDER — OXYCODONE HCL 5 MG PO TABS
ORAL_TABLET | ORAL | Status: AC
  Filled 2017-03-14: qty 1

## 2017-03-14 MED ORDER — PROPOFOL 500 MG/50ML IV EMUL
INTRAVENOUS | Status: DC | PRN
  Administered 2017-03-14: 50 ug/kg/min via INTRAVENOUS

## 2017-03-14 MED ORDER — BUPIVACAINE-EPINEPHRINE 0.25% -1:200000 IJ SOLN
INTRAMUSCULAR | Status: DC | PRN
  Administered 2017-03-14: 30 mL

## 2017-03-14 MED ORDER — HYDROCODONE-ACETAMINOPHEN 5-325 MG PO TABS: 1.0000 | ORAL_TABLET | Freq: Four times a day (QID) | ORAL | 0 refills | Status: DC | PRN

## 2017-03-14 MED ORDER — LACTATED RINGERS IV SOLN
INTRAVENOUS | Status: DC | PRN
  Administered 2017-03-14: 10:00:00 via INTRAVENOUS

## 2017-03-14 SURGICAL SUPPLY — 15 items
CANISTER SUCT 1200ML W/VALVE (MISCELLANEOUS) ×4 IMPLANT
DRAPE LEGGINS SURG 28X43 STRL (DRAPES) ×4 IMPLANT
DRAPE UNDER BUTTOCK W/FLU (DRAPES) ×4 IMPLANT
ELECT REM PT RETURN 9FT ADLT (ELECTROSURGICAL)
ELECTRODE REM PT RTRN 9FT ADLT (ELECTROSURGICAL) IMPLANT
GLOVE BIO SURGEON STRL SZ7 (GLOVE) ×4 IMPLANT
GOWN STRL REUS W/ TWL LRG LVL3 (GOWN DISPOSABLE) ×4 IMPLANT
GOWN STRL REUS W/TWL LRG LVL3 (GOWN DISPOSABLE) ×4
JELLY LUB 2OZ STRL (MISCELLANEOUS) ×2
JELLY LUBE 2OZ STRL (MISCELLANEOUS) ×2 IMPLANT
NDL SAFETY 22GX1.5 (NEEDLE) ×4 IMPLANT
PACK BASIN MINOR ARMC (MISCELLANEOUS) ×4 IMPLANT
SOL PREP PVP 2OZ (MISCELLANEOUS) ×4
SOLUTION PREP PVP 2OZ (MISCELLANEOUS) ×2 IMPLANT
SPONGE LAP 18X18 5 PK (GAUZE/BANDAGES/DRESSINGS) ×4 IMPLANT

## 2017-03-14 NOTE — Anesthesia Preprocedure Evaluation (Addendum)
Anesthesia Evaluation  Patient identified by MRN, date of birth, ID band Patient awake    Reviewed: Allergy & Precautions, H&P , NPO status , Patient's Chart, lab work & pertinent test results  History of Anesthesia Complications Negative for: history of anesthetic complications  Airway Mallampati: III  TM Distance: <3 FB Neck ROM: limited    Dental  (+) Poor Dentition, Lower Dentures, Edentulous Upper, Edentulous Lower, Upper Dentures   Pulmonary neg shortness of breath, former smoker,    Pulmonary exam normal breath sounds clear to auscultation       Cardiovascular Exercise Tolerance: Poor hypertension, (-) angina+ CAD, + Past MI and +CHF  Normal cardiovascular exam Rhythm:regular Rate:Normal     Neuro/Psych  Headaches, PSYCHIATRIC DISORDERS Anxiety Depression    GI/Hepatic Neg liver ROS, GERD  Controlled and Medicated,  Endo/Other  Hypothyroidism   Renal/GU negative Renal ROS  negative genitourinary   Musculoskeletal  (+) Arthritis ,   Abdominal   Peds  Hematology negative hematology ROS (+)   Anesthesia Other Findings Past Medical History: 2014: Breast cancer ([place])     Comment: Right breast cancer - chemo, radiation and               Mastectomy 2013: CHF (congestive heart failure) ([place]) No date: Clotting disorder ([place])     Comment: blood clots 2012: Heart attack ([place])     Comment: coronary stent x2 completed by Dr. Clayborn Bigness. Apr 29, 2013: Heart attack ([place]) No date: Hemorrhoids No date: Hypertension No date: IBS (irritable bowel syndrome) January 20, 2013.: Malignant neoplasm of lower-inner quadrant of *     Comment: invasive mammary cancer, minimal 0.85 cm.                Histologic grade 1. 2014: Personal history of chemotherapy     Comment: BREAST CA 2014: Personal history of radiation therapy     Comment: BREAST CA No date: Thyroid disease  Past Surgical History: 02-21-2013: BREAST SURGERY Right    Comment: right mastectomy 09/15/2010: COLONOSCOPY W/ BIOPSIES     Comment: Colonoscopy completed by Verdie Shire, M.D. Tubular              adenoma of the ascending colon and descending               colon reported up to 0.4 cm in diameter. No               atypia. 2014: MASTECTOMY Right     Comment: BREAST CA 09/15/2010  : UPPER GI ENDOSCOPY     Comment: Completed by Verdie Shire, M.D. for nausea. Normal               exam reported. No date: VASCULAR SURGERY  BMI    Body Mass Index:  30.04 kg/m      Reproductive/Obstetrics negative OB ROS                             Anesthesia Physical  Anesthesia Plan  ASA: III  Anesthesia Plan: General   Post-op Pain Management:    Induction: Intravenous  PONV Risk Score and Plan: 3 and Ondansetron, Dexamethasone and Treatment may vary due to age or medical condition  Airway Management Planned: Natural Airway and Nasal Cannula  Additional Equipment:   Intra-op Plan:   Post-operative Plan: Extubation in OR  Informed Consent: I have reviewed the patients History and Physical, chart, labs and discussed the procedure including  the risks, benefits and alternatives for the proposed anesthesia with the patient or authorized representative who has indicated his/her understanding and acceptance.   Dental Advisory Given  Plan Discussed with: Anesthesiologist, CRNA and Surgeon  Anesthesia Plan Comments: (Patient has cardiac clearance for this procedure.   Patient consented for risks of anesthesia including but not limited to:  - adverse reactions to medications - damage to teeth, lips or other oral mucosa - sore throat or hoarseness - Damage to heart, brain, lungs or loss of life  Patient voiced understanding.)       Anesthesia Quick Evaluation

## 2017-03-14 NOTE — H&P (View-Only) (Signed)
Outpatient Surgical Follow Up  02/28/2017  Abigail Ortega is an 66 y.o. female.   Chief Complaint  Patient presents with  . Follow-up    Hemorrhoids / Anal Fissure    HPI: 66 year old following up for anal fissures. She start nifedipine cream, sitz baths and fiber. She reports her husband been no relief after instillation of therapy. She does report some nausea but her main complaint is anal rectal pain. She feels sharp pain whenever she passes a bowel movement and feels like she is passing"razor blades". No evidence of incontinence. He does take Plavix for a previous MI. No evidence of acute exacerbation from her coronary artery disease or heart failure.  Past Medical History:  Diagnosis Date  . Arthritis   . Breast cancer [facility]) 2014   Right breast cancer - chemo, radiation and Mastectomy  . CHF (congestive heart failure) ([place]) 2013  . Clotting disorder ([place])    blood clots  . GI bleed   . Heart attack [facility]) 2012   coronary stent x2 completed by Dr. Clayborn Bigness.  Marland Kitchen Heart attack ([place]) Apr 29, 2013  . Hemorrhoids   . Hypertension   . IBS (irritable bowel syndrome)   . Malignant neoplasm of lower-inner quadrant of female breast [facility]) January 20, 2013.   invasive mammary cancer, minimal 0.85 cm.  Histologic grade 1.  . Personal history of chemotherapy 2014   BREAST CA  . Personal history of radiation therapy 2014   BREAST CA  . Thyroid disease     Past Surgical History:  Procedure Laterality Date  . BREAST SURGERY Right 02-21-2013   right mastectomy  . CARDIAC CATHETERIZATION    . COLONOSCOPY W/ BIOPSIES  09/15/2010   Colonoscopy completed by Verdie Shire, M.D. Tubular adenoma of the ascending colon and descending colon reported up to 0.4 cm in diameter. No atypia.  . COLONOSCOPY WITH PROPOFOL N/A 12/23/2016   Procedure: COLONOSCOPY WITH PROPOFOL;  Surgeon: Wilford Corner, MD;  Location: [facility] ENDOSCOPY;  Service: Endoscopy;  Laterality: N/A;  . ESOPHAGOGASTRODUODENOSCOPY N/A  12/13/2016   Procedure: ESOPHAGOGASTRODUODENOSCOPY (EGD);  Surgeon: [name], MD;  Location: [facility] ENDOSCOPY;  Service: Endoscopy;  Laterality: N/A;  . MASTECTOMY Right 2014   BREAST CA  . UPPER GI ENDOSCOPY  09/15/2010     Completed by Verdie Shire, M.D. for nausea. Normal exam reported.  Marland Kitchen VASCULAR SURGERY      Family History  Problem Relation Age of Onset  . Cancer Mother 88       breast  . Breast cancer Mother 55  . Cancer Father        colon    Social History:  reports that she has quit smoking. She quit after 20.00 years of use. She has never used smokeless tobacco. She reports that she does not drink alcohol or use drugs.  Allergies: No Known Allergies  Medications reviewed.  ROS Full ROS performed and is otherwise negative other than what is stated in HPI   BP 118/79   Pulse 89   Temp 98.1 F (36.7 C) (Oral)   Ht 5\' 6"  (1.676 m)   Wt 77.5 kg (170 lb 12.8 oz)   BMI 27.57 kg/m    Physical Exam  Constitutional: She is oriented to person, place, and time and well-developed, well-nourished, and in no distress. No distress.  Neck: No JVD present.  Cardiovascular: Normal rate and intact distal pulses.   Pulmonary/Chest: Effort normal. No stridor. No respiratory distress. She exhibits no tenderness.  Abdominal: Soft.  She exhibits no distension. There is no tenderness. There is no rebound and no guarding.  Genitourinary:  Genitourinary Comments: Recta: posterior midline, no masses, no hemorrhoids , no abscess  Musculoskeletal: Normal range of motion. She exhibits no edema or deformity.  Neurological: She is alert and oriented to person, place, and time. Gait normal. GCS score is 15.  Skin: Skin is warm and dry. She is not diaphoretic.  Psychiatric: Mood, memory, affect and judgment normal.    Assessment/Plan: Anal fissure nonresponsive to medical management. Discussed with the patient in detail about therapeutic options to include chemical versus surgical  sphincterotomy. She wishes to proceed with chemical sphincterotomy. Discussed with the patient in detail about the procedure. Risks, benefits and possible complications including but not limited to: Bleeding, infection, recurrence, incontinence. She understands and she wishes to proceed. He needs to be off Plavix for at least a week   Caroleen Hamman, MD [place] Surgeon

## 2017-03-14 NOTE — Op Note (Signed)
  03/14/2017  10:29 AM  PATIENT:  Abigail Ortega  66 y.o. female  PRE-OPERATIVE DIAGNOSIS: Anal fissure  POST-OPERATIVE DIAGNOSIS:  Same  PROCEDURE:   Exam under anesthesia and chemical sphinterotomy Anal sphincter   EBL: minimal  SURGEON:  Surgeon(s) and Role:    * Pabon, Diego F, MD - Primary   ANESTHESIA: IV general  FINDINGS: posterior midline fissure, no evidence of masses  DICTATION:  Patient was playing about proceeding detail, risk benefits possible complications and a consent was obtained. The patient taken to the operating room and placed in the supine.   Exam revealed a posterior midline fissure. Using 50 IU of Botox we injected the sphincter muscle complex after identified it with bi digital palpation.  Marcaine quarter percent with epinephrine was injected for analgesia. Needle and laparotomy counts were correct and there were no immediate complications  Jules Husbands, MD

## 2017-03-14 NOTE — Discharge Instructions (Signed)

## 2017-03-14 NOTE — Anesthesia Postprocedure Evaluation (Signed)
Anesthesia Post Note  Patient: Abigail Ortega  Procedure(s) Performed: Procedure(s) (LRB): EXAM UNDER ANESTHESIA (N/A) BOTOX INJECTION (N/A)  Patient location during evaluation: PACU Anesthesia Type: General Level of consciousness: awake and alert Pain management: pain level controlled Vital Signs Assessment: post-procedure vital signs reviewed and stable Respiratory status: spontaneous breathing, nonlabored ventilation, respiratory function stable and patient connected to nasal cannula oxygen Cardiovascular status: blood pressure returned to baseline and stable Postop Assessment: no signs of nausea or vomiting Anesthetic complications: no     Last Vitals:  Vitals:   03/14/17 1154 03/14/17 1209  BP: (!) 120/59 124/66  Pulse: 70 72  Resp: 16   Temp:    SpO2: 100%     Last Pain:  Vitals:   03/14/17 1208  TempSrc:   PainSc: 5                  Precious Haws Dandra Shambaugh

## 2017-03-14 NOTE — OR Nursing (Signed)
Port on left side of chest

## 2017-03-14 NOTE — Transfer of Care (Signed)
Immediate Anesthesia Transfer of Care Note  Patient: Abigail Ortega  Procedure(s) Performed: Procedure(s) with comments: EXAM UNDER ANESTHESIA (N/A) - Lithotomy. Botox injection BOTOX INJECTION (N/A)  Patient Location: PACU  Anesthesia Type:MAC  Level of Consciousness: awake  Airway & Oxygen Therapy: Patient Spontanous Breathing  Post-op Assessment: Report given to RN  Post vital signs: Reviewed  Last Vitals:  Vitals:   03/14/17 0909  BP: 106/70  Pulse: 86  Resp: 18  Temp: 37.3 C  SpO2: 99%    Last Pain:  Vitals:   03/14/17 0909  TempSrc: Oral         Complications: No apparent anesthesia complications

## 2017-03-14 NOTE — Interval H&P Note (Signed)
History and Physical Interval Note:  03/14/2017 9:31 AM  Abigail Ortega  has presented today for surgery, with the diagnosis of fissure  The various methods of treatment have been discussed with the patient and family. After consideration of risks, benefits and other options for treatment, the patient has consented to  Procedure(s) with comments: EXAM UNDER ANESTHESIA WITH ANAL FISSUROTOMY (N/A) - Lithotomy. Botox injection BOTOX INJECTION (N/A) as a surgical intervention .  The patient's history has been reviewed, patient examined, no change in status, stable for surgery.  I have reviewed the patient's chart and labs.  Questions were answered to the patient's satisfaction.     Roff

## 2017-03-14 NOTE — Anesthesia Post-op Follow-up Note (Signed)
Anesthesia QCDR form completed.        

## 2017-04-05 ENCOUNTER — Ambulatory Visit (INDEPENDENT_AMBULATORY_CARE_PROVIDER_SITE_OTHER): Payer: Medicare Other | Admitting: Surgery

## 2017-04-05 ENCOUNTER — Encounter: Payer: Self-pay | Admitting: Surgery

## 2017-04-05 VITALS — BP 121/81 | HR 81 | Temp 98.3°F | Ht 66.0 in | Wt 170.6 lb

## 2017-04-05 DIAGNOSIS — Z09 Encounter for follow-up examination after completed treatment for conditions other than malignant neoplasm: Secondary | ICD-10-CM

## 2017-04-05 MED ORDER — GABAPENTIN 300 MG PO CAPS: 300.0000 mg | ORAL_CAPSULE | Freq: Three times a day (TID) | ORAL | 0 refills | Status: DC

## 2017-04-05 NOTE — Progress Notes (Signed)
S/p botox injection She feels better but still has pain Recently he was given a course of the Steris that I think has set her back and delayed her healing  PE NAD Rectal: post midline fissure   A/P continue twice a day nifedipine. Advised patient not to take any steroids RTC 3 weeks

## 2017-04-05 NOTE — Patient Instructions (Addendum)
You may use the Nifedipine cream three times daily as well as three sitz baths.  Please do not use ice th the area. Please see your follow up appointment listed below.

## 2017-04-06 ENCOUNTER — Encounter: Payer: Self-pay | Admitting: Surgery

## 2017-04-19 ENCOUNTER — Ambulatory Visit (INDEPENDENT_AMBULATORY_CARE_PROVIDER_SITE_OTHER): Payer: Medicare Other | Admitting: Surgery

## 2017-04-19 ENCOUNTER — Encounter: Payer: Self-pay | Admitting: Surgery

## 2017-04-19 VITALS — BP 124/79 | HR 76 | Temp 98.2°F | Ht 66.0 in | Wt 176.2 lb

## 2017-04-19 DIAGNOSIS — K432 Incisional hernia without obstruction or gangrene: Secondary | ICD-10-CM

## 2017-04-19 NOTE — Progress Notes (Signed)
S/p botox injection She feels better  Some mild pain  But much improved as compared to before the injection  PE NAD Abd: soft, nt, no masses Rectal: post midline fissure healing well, improvement from last visit   A/P continue twice a day nifedipine, sitz baths and high fiber.RTC 4 wks

## 2017-04-19 NOTE — Patient Instructions (Addendum)
We have seen you today for an Anal Fissure. Please read the information below in regards to your diagnosis.  We have sent over a refill of medication to [place]. [facility] is located at [address] in [place]. This is the only pharmacy that will compound this particular medication.  We will want you to begin doing Sitz Baths three times daily at the minimum and after every bowel movement. It is extremely important to keep this area as clean as possible. I have included instructions for how to take a sitz bath, below.  Please see your follow-up appointment below.   If you have any questions or concerns prior to this appointment, please call our office and speak with a nurse.  Anal Fissure, Adult An anal fissure is a small tear or crack in the skin around the anus. Bleeding from a fissure usually stops on its own within a few minutes. However, bleeding will often occur again with each bowel movement until the crack heals. What are the causes? This condition may be caused by:  Passing large, hard stool (feces).  Frequent diarrhea.  Constipation.  Inflammatory bowel disease (Crohn disease or ulcerative colitis).  Infections.  Anal sex.  What are the signs or symptoms? Symptoms of this condition include:  Bleeding from the rectum.  Small amounts of blood seen on your stool, on toilet paper, or in the toilet after a bowel movement.  Painful bowel movements.  Itching or irritation around the anus.  How is this diagnosed? A health care provider may diagnose this condition by closely examining the anal area. An anal fissure can usually be seen with careful inspection. In some cases, a rectal exam may be performed, or a short tube (anoscope) may be used to examine the anal canal. How is this treated? Treatment for this condition may include:  Taking steps to avoid constipation. This may include making changes to your diet, such as increasing your intake of fiber or  fluid.  Taking fiber supplements. These supplements can soften your stool to help make bowel movements easier. Your health care provider may also prescribe a stool softener if your stool is often hard.  Taking sitz baths. This may help to heal the tear.  Using medicated creams or ointments. These may be prescribed to lessen discomfort.  Follow these instructions at home: Eating and drinking  Avoid foods that may be constipating, such as bananas and dairy products.  Drink enough fluid to keep your urine clear or pale yellow.  Maintain a diet that is high in fruits, whole grains, and vegetables. General instructions  Keep the anal area as clean and dry as possible.  Take sitz baths as told by your health care provider. Do not use soap in the sitz baths.  Take over-the-counter and prescription medicines only as told by your health care provider.  Use creams or ointments only as told by your health care provider.  Keep all follow-up visits as told by your health care provider. This is important. Contact a health care provider if:  You have more bleeding.  You have a fever.  You have diarrhea that is mixed with blood.  You continue to have pain.  Your problem is getting worse rather than better. This information is not intended to replace advice given to you by your health care provider. Make sure you discuss any questions you have with your health care provider. Document Released: 07/03/2005 Document Revised: 11/10/2015 Document Reviewed: 09/28/2014 Elsevier Interactive Patient Education  2018  [place].   How to Take a [employer] A sitz bath is a warm water bath that is taken while you are sitting down. The water should only come up to your hips and should cover your buttocks. Your health care provider may recommend a sitz bath to help you:  Clean the lower part of your body, including your genital area.  With itching.  With pain.  With sore muscles or muscles that  tighten or spasm.  How to take a sitz bath Take 3-4 sitz baths per day or as told by your health care provider. 1. Partially fill a bathtub with warm water. You will only need the water to be deep enough to cover your hips and buttocks when you are sitting in it. 2. If your health care provider told you to put medicine in the water, follow the directions exactly. 3. Sit in the water and open the tub drain a little. 4. Turn on the warm water again to keep the tub at the correct level. Keep the water running constantly. 5. Soak in the water for 15-20 minutes or as told by your health care provider. 6. After the sitz bath, pat the affected area dry first. Do not rub it. 7. Be careful when you stand up after the sitz bath because you may feel dizzy.  Contact a health care provider if:  Your symptoms get worse. Do not continue with sitz baths if your symptoms get worse.  You have new symptoms. Do not continue with sitz baths until you talk with your health care provider. This information is not intended to replace advice given to you by your health care provider. Make sure you discuss any questions you have with your health care provider. Document Released: 03/25/2004 Document Revised: 12/01/2015 Document Reviewed: 07/01/2014 Elsevier Interactive Patient Education  [employer].

## 2017-05-03 ENCOUNTER — Other Ambulatory Visit: Payer: Self-pay | Admitting: Oncology

## 2017-05-07 ENCOUNTER — Other Ambulatory Visit: Payer: Self-pay | Admitting: General Practice

## 2017-05-07 NOTE — Telephone Encounter (Signed)
Pain is returning around her rectum as of this week. She states that she has had relief since the surgery and the pain still isn't as bad as prior to her surgery. But, she is having increased pain to this area in the last week. Denies bleeding. Denies hard stools. Discussed with patient the importance of continuing the bowel regimen, compounded ointment for rectum, and sitz baths. Moved up patient's appointment to Wednesday to discuss this with Dr. [name].

## 2017-05-07 NOTE — Telephone Encounter (Signed)
Patient is calling needing a refill on her HYDROcodone-acetaminophen patient is due to come back in on 05/21/17 with Dr. [name] please call patient and advice.

## 2017-05-10 ENCOUNTER — Ambulatory Visit (INDEPENDENT_AMBULATORY_CARE_PROVIDER_SITE_OTHER): Payer: Medicare Other | Admitting: Surgery

## 2017-05-10 ENCOUNTER — Encounter: Payer: Self-pay | Admitting: Surgery

## 2017-05-10 VITALS — BP 124/78 | HR 78 | Temp 97.6°F | Ht 66.0 in | Wt 179.0 lb

## 2017-05-10 DIAGNOSIS — K602 Anal fissure, unspecified: Secondary | ICD-10-CM

## 2017-05-10 NOTE — Progress Notes (Signed)
Outpatient Surgical Follow Up  05/10/2017  Abigail Ortega is an 66 y.o. female.     HPI:  F/u for anal fissure. Some rectal pain. No hematochezia.  No fevers or chills. Soft BM. Pain only when having BMs Some bilateral inguinal pain. Taking PO  Past Medical History:  Diagnosis Date  . Arthritis   . Breast cancer [facility]) 2014   Right breast cancer - chemo, radiation and Mastectomy  . CHF (congestive heart failure) ([place]) 2013  . Clotting disorder ([place])    blood clots  . GERD (gastroesophageal reflux disease)   . GI bleed   . Heart attack [facility]) 2012   coronary stent x2 completed by Dr. Clayborn Bigness.  Marland Kitchen Heart attack ([place]) Apr 29, 2013  . Hemorrhoids   . Hypertension   . Hypothyroidism   . IBS (irritable bowel syndrome)   . Malignant neoplasm of lower-inner quadrant of female breast [facility]) January 20, 2013.   invasive mammary cancer, minimal 0.85 cm.  Histologic grade 1.  . Personal history of chemotherapy 2014   BREAST CA  . Personal history of radiation therapy 2014   BREAST CA  . Thyroid disease     Past Surgical History:  Procedure Laterality Date  . BOTOX INJECTION N/A 03/14/2017   Procedure: BOTOX INJECTION;  Surgeon: Jules Husbands, MD;  Location: [facility] ORS;  Service: General;  Laterality: N/A;  . BREAST SURGERY Right 02-21-2013   right mastectomy  . CARDIAC CATHETERIZATION    . COLONOSCOPY W/ BIOPSIES  09/15/2010   Colonoscopy completed by Verdie Shire, M.D. Tubular adenoma of the ascending colon and descending colon reported up to 0.4 cm in diameter. No atypia.  . COLONOSCOPY WITH PROPOFOL N/A 12/23/2016   Procedure: COLONOSCOPY WITH PROPOFOL;  Surgeon: Wilford Corner, MD;  Location: [facility] ENDOSCOPY;  Service: Endoscopy;  Laterality: N/A;  . ESOPHAGOGASTRODUODENOSCOPY N/A 12/13/2016   Procedure: ESOPHAGOGASTRODUODENOSCOPY (EGD);  Surgeon: [name], MD;  Location: [facility] ENDOSCOPY;  Service: Endoscopy;  Laterality: N/A;  . MASTECTOMY Right 2014   BREAST CA  . PORTA CATH  INSERTION    . UPPER GI ENDOSCOPY  09/15/2010     Completed by Verdie Shire, M.D. for nausea. Normal exam reported.  Marland Kitchen VASCULAR SURGERY      Family History  Problem Relation Age of Onset  . Cancer Mother 32       breast  . Breast cancer Mother 22  . Cancer Father        colon    Social History:  reports that she has quit smoking. She quit after 20.00 years of use. She has never used smokeless tobacco. She reports that she does not drink alcohol or use drugs.  Allergies: No Known Allergies  Medications reviewed.    ROS Full ROS performed and is otherwise negative other than what is stated in HPI   BP 124/78   Pulse 78   Temp 97.6 F (36.4 C) (Oral)   Ht 5\' 6"  (1.676 m)   Wt 81.2 kg (179 lb)   BMI 28.89 kg/m   Physical Exam  Constitutional: She is oriented to person, place, and time and well-developed, well-nourished, and in no distress. No distress.  Neck: No JVD present.  Pulmonary/Chest: No stridor.  Abdominal: Soft. She exhibits no distension. There is no tenderness. There is no rebound and no guarding.  Genitourinary:  Genitourinary Comments: Persistent posterior anal fissure, no major changes from previous exam  Neurological: She is alert and oriented to person, place, and time. Gait  normal.  Psychiatric: Mood, memory, affect and judgment normal.  Nursing note and vitals reviewed.     Assessment/Plan:  1 Anal fissure  Continue 3-4 more weeks of nifedipine. I suspect she is not going further up inside the anus. D/W her in detail about this. If in 3-4 weeks this has not improved may need a repeat Botox injection. She understands   Caroleen Hamman, MD [facility] General Surgeon

## 2017-05-10 NOTE — Patient Instructions (Signed)
Please continue to do the sitz baths twice a day.  Try to put more of the Nifedipine cream on your anal fissure twice a day. We will call in a refill on your Nifedipine to [place].  Remember to keep the rectal area really clean with baby wipes after every bowel movement.   For you sinuses, try taking the Vicks nasal decongestant and applying a warm towel on your forehead.

## 2017-05-21 ENCOUNTER — Encounter: Payer: Self-pay | Admitting: Surgery

## 2017-06-11 ENCOUNTER — Other Ambulatory Visit: Payer: Self-pay

## 2017-06-13 ENCOUNTER — Ambulatory Visit: Payer: Self-pay | Admitting: Surgery

## 2017-06-14 ENCOUNTER — Encounter: Payer: Self-pay | Admitting: Surgery

## 2017-06-14 ENCOUNTER — Ambulatory Visit (INDEPENDENT_AMBULATORY_CARE_PROVIDER_SITE_OTHER): Payer: Medicare Other | Admitting: Surgery

## 2017-06-14 VITALS — BP 152/83 | HR 93 | Temp 98.4°F | Ht 66.0 in | Wt 183.0 lb

## 2017-06-14 DIAGNOSIS — K602 Anal fissure, unspecified: Secondary | ICD-10-CM | POA: Diagnosis not present

## 2017-06-14 NOTE — Patient Instructions (Addendum)
I have called in a refill for your Nifedipine cream to [place].   Please see your follow up appointment listed below.

## 2017-06-14 NOTE — Progress Notes (Signed)
Outpatient Surgical Follow Up  06/14/2017  Abigail Ortega is an 66 y.o. female.   Chief Complaint  Patient presents with  . Follow-up    Sphincterotomy 03/14/17 Dr.Jiselle Sheu    HPI: s/ p buttresses injection. She continues to improve. Very mild discomfort. Bowel movements more softer. No hematochezia. No fevers no chills. She feels better  Past Medical History:  Diagnosis Date  . Arthritis   . Breast cancer [facility]) 2014   Right breast cancer - chemo, radiation and Mastectomy  . CHF (congestive heart failure) ([place]) 2013  . Clotting disorder ([place])    blood clots  . GERD (gastroesophageal reflux disease)   . GI bleed   . Heart attack [facility]) 2012   coronary stent x2 completed by Dr. Clayborn Bigness.  Marland Kitchen Heart attack ([place]) Apr 29, 2013  . Hemorrhoids   . Hypertension   . Hypothyroidism   . IBS (irritable bowel syndrome)   . Malignant neoplasm of lower-inner quadrant of female breast [facility]) January 20, 2013.   invasive mammary cancer, minimal 0.85 cm.  Histologic grade 1.  . Personal history of chemotherapy 2014   BREAST CA  . Personal history of radiation therapy 2014   BREAST CA  . Thyroid disease     Past Surgical History:  Procedure Laterality Date  . BOTOX INJECTION N/A 03/14/2017   Procedure: BOTOX INJECTION;  Surgeon: Jules Husbands, MD;  Location: [facility] ORS;  Service: General;  Laterality: N/A;  . BREAST SURGERY Right 02-21-2013   right mastectomy  . CARDIAC CATHETERIZATION    . COLONOSCOPY W/ BIOPSIES  09/15/2010   Colonoscopy completed by Verdie Shire, M.D. Tubular adenoma of the ascending colon and descending colon reported up to 0.4 cm in diameter. No atypia.  . COLONOSCOPY WITH PROPOFOL N/A 12/23/2016   Procedure: COLONOSCOPY WITH PROPOFOL;  Surgeon: Wilford Corner, MD;  Location: [facility] ENDOSCOPY;  Service: Endoscopy;  Laterality: N/A;  . ESOPHAGOGASTRODUODENOSCOPY N/A 12/13/2016   Procedure: ESOPHAGOGASTRODUODENOSCOPY (EGD);  Surgeon: [name], MD;  Location: [facility] ENDOSCOPY;   Service: Endoscopy;  Laterality: N/A;  . MASTECTOMY Right 2014   BREAST CA  . PORTA CATH INSERTION    . UPPER GI ENDOSCOPY  09/15/2010     Completed by Verdie Shire, M.D. for nausea. Normal exam reported.  Marland Kitchen VASCULAR SURGERY      Family History  Problem Relation Age of Onset  . Cancer Mother 9       breast  . Breast cancer Mother 16  . Cancer Father        colon    Social History:  reports that she has quit smoking. She quit after 20.00 years of use. she has never used smokeless tobacco. She reports that she does not drink alcohol or use drugs.  Allergies: No Known Allergies  Medications reviewed.    ROS Full ROS performed and is otherwise negative other than what is stated in HPI   BP (!) 152/83   Pulse 93   Temp 98.4 F (36.9 C) (Oral)   Ht 5\' 6"  (1.676 m)   Wt 83 kg (183 lb)   BMI 29.54 kg/m   Physical Exam  Constitutional: She is oriented to person, place, and time and well-developed, well-nourished, and in no distress. No distress.  Pulmonary/Chest: Effort normal. No respiratory distress.  Abdominal: Soft. She exhibits no distension. There is no tenderness. There is no rebound.  Genitourinary:  Genitourinary Comments: No masses, no evidence of fissure  Neurological: She is alert and oriented to person,  place, and time. Gait normal. GCS score is 15.  Skin: Skin is warm and dry. She is not diaphoretic.  Psychiatric: Mood, memory, affect and judgment normal.       No results found for this or any previous visit (from the past 48 hour(s)). No results found.  Assessment/Plan:  Anal fissure with significant improvement after Botox injection. We'll: Continue sitz baths, bowel regimen and nifedipine cream. We will have her return to my office in 2-3 months. No complications and she is doing well  Caroleen Hamman, MD [place] Surgeon

## 2017-08-22 ENCOUNTER — Ambulatory Visit: Payer: Medicare Other | Admitting: Surgery

## 2017-08-24 NOTE — Progress Notes (Signed)
[place]  [phone] [phone]) [phone]  ID: Abigail Ortega OB: 02/04/1951  MR#: [id number]  URK#:[id number]  Patient Care Team: Perrin Maltese, MD as PCP - General (Internal Medicine) Bary Castilla, Forest Gleason, MD (General Surgery) Sharene Butters, MD (General Surgery)  CHIEF COMPLAINT: Pathologic stage IIIa triple positive invasive carcinoma of the lower inner quadrant of the right breast.   INTERVAL HISTORY: Patient returns to clinic today for routine 6 month evaluation. She currently feels well. She continues to tolerate letrozole without significant side effects. She has no neurologic complaints. She denies any recent fevers or illnesses. She has no chest pain or shortness of breath. She denies any nausea, vomiting, constipation, or diarrhea. She has no urinary complaints.  Patient offers no specific complaints today.  REVIEW OF SYSTEMS:   Review of Systems  Constitutional: Negative for chills, fever, malaise/fatigue and weight loss.  HENT: Negative.  Negative for tinnitus.   Eyes: Negative.   Respiratory: Negative.  Negative for cough and sputum production.   Cardiovascular: Negative.  Negative for chest pain and leg swelling.  Gastrointestinal: Negative.  Negative for abdominal pain, constipation, diarrhea, nausea and vomiting.  Genitourinary: Negative.  Negative for dysuria.  Musculoskeletal: Negative.  Negative for myalgias.  Skin: Negative.  Negative for rash.  Neurological: Negative.  Negative for sensory change, weakness and headaches.  Psychiatric/Behavioral: The patient is nervous/anxious. The patient does not have insomnia.     As per HPI. Otherwise, a complete review of systems is negative.  PAST MEDICAL HISTORY: Past Medical History:  Diagnosis Date  . Arthritis   . Breast cancer [facility]) 2014   Right breast cancer - chemo, radiation and Mastectomy  . CHF (congestive heart failure) ([place]) 2013  . Clotting disorder ([place])    blood  clots  . GERD (gastroesophageal reflux disease)   . GI bleed   . Heart attack [facility]) 2012   coronary stent x2 completed by Dr. Clayborn Bigness.  Marland Kitchen Heart attack ([place]) Apr 29, 2013  . Hemorrhoids   . Hypertension   . Hypothyroidism   . IBS (irritable bowel syndrome)   . Malignant neoplasm of lower-inner quadrant of female breast [facility]) January 20, 2013.   invasive mammary cancer, minimal 0.85 cm.  Histologic grade 1.  . Personal history of chemotherapy 2014   BREAST CA  . Personal history of radiation therapy 2014   BREAST CA  . Thyroid disease     PAST SURGICAL HISTORY: Past Surgical History:  Procedure Laterality Date  . BOTOX INJECTION N/A 03/14/2017   Procedure: BOTOX INJECTION;  Surgeon: Jules Husbands, MD;  Location: [facility] ORS;  Service: General;  Laterality: N/A;  . BREAST SURGERY Right 02-21-2013   right mastectomy  . CARDIAC CATHETERIZATION    . COLONOSCOPY W/ BIOPSIES  09/15/2010   Colonoscopy completed by Verdie Shire, M.D. Tubular adenoma of the ascending colon and descending colon reported up to 0.4 cm in diameter. No atypia.  . COLONOSCOPY WITH PROPOFOL N/A 12/23/2016   Procedure: COLONOSCOPY WITH PROPOFOL;  Surgeon: Wilford Corner, MD;  Location: [facility] ENDOSCOPY;  Service: Endoscopy;  Laterality: N/A;  . ESOPHAGOGASTRODUODENOSCOPY N/A 12/13/2016   Procedure: ESOPHAGOGASTRODUODENOSCOPY (EGD);  Surgeon: [name], MD;  Location: [facility] ENDOSCOPY;  Service: Endoscopy;  Laterality: N/A;  . MASTECTOMY Right 2014   BREAST CA  . PORTA CATH INSERTION    . UPPER GI ENDOSCOPY  09/15/2010     Completed by Verdie Shire, M.D. for nausea. Normal exam reported.  Marland Kitchen VASCULAR SURGERY  FAMILY HISTORY Family History  Problem Relation Age of Onset  . Cancer Mother 21       breast  . Breast cancer Mother 65  . Cancer Father        colon       ADVANCED DIRECTIVES:    HEALTH MAINTENANCE: Social History   Tobacco Use  . Smoking status: Former Smoker    Years: 20.00  . Smokeless tobacco:  Never Used  Substance Use Topics  . Alcohol use: No  . Drug use: No     Colonoscopy:  PAP:  Bone density:  Lipid panel:  No Known Allergies  Current Outpatient Medications  Medication Sig Dispense Refill  . acetaminophen (TYLENOL 8 HOUR ARTHRITIS PAIN) 650 MG CR tablet Take 1,300 mg by mouth every 8 (eight) hours as needed for pain.     Marland Kitchen ALPRAZolam (XANAX) 0.25 MG tablet Take 0.25 mg by mouth 2 (two) times daily as needed for anxiety.   2  . aspirin EC 81 MG tablet Take 81 mg by mouth daily.    Marland Kitchen atorvastatin (LIPITOR) 40 MG tablet Take 1 tablet by mouth once daily with breakfast    . azelastine (ASTELIN) 0.1 % nasal spray Place 1 spray into both nostrils 2 (two) times daily.  4  . cholecalciferol (VITAMIN D) 1000 units tablet Take 1,000 Units by mouth daily.    . clopidogrel (PLAVIX) 75 MG tablet take 1 tablet daily with breakfast    . [facility] SALINE NOSE SPRAY 0.65 % nasal spray Place 1-2 sprays into the nose daily.  11  . fexofenadine (ALLEGRA) 60 MG tablet Take 30 mg by mouth daily.    . fluticasone (FLONASE) 50 MCG/ACT nasal spray Place 1 spray into both nostrils daily as needed for allergies or rhinitis.     Marland Kitchen gabapentin (NEURONTIN) 300 MG capsule Take 1 capsule (300 mg total) by mouth 3 (three) times daily. 90 capsule 0  . hydrALAZINE (APRESOLINE) 25 MG tablet Take 25 mg by mouth 3 (three) times daily.    Marland Kitchen HYDROcodone-acetaminophen (NORCO/VICODIN) 5-325 MG tablet Take 1-2 tablets by mouth every 6 (six) hours as needed for moderate pain. 20 tablet 0  . isosorbide mononitrate (IMDUR) 30 MG 24 hr tablet Take 30 mg by mouth daily with breakfast.    . letrozole (FEMARA) 2.5 MG tablet TAKE 1 TABLET BY MOUTH  DAILY 90 tablet 1  . levothyroxine (SYNTHROID, LEVOTHROID) 75 MCG tablet Take 75 mcg by mouth daily before breakfast.    . MAGNESIUM PO Take 1 tablet by mouth 1 day or 1 dose.    . montelukast (SINGULAIR) 10 MG tablet Take 10 mg by mouth at bedtime.    . Multiple Vitamin  (MULTIVITAMIN WITH MINERALS) TABS tablet Take 1 tablet by mouth daily.    . Olopatadine HCl 0.2 % SOLN INSTILL 1 DROP IN EACH EYE DAILY  2  . pantoprazole (PROTONIX) 40 MG tablet TAKE 1 TABLET BY MOUTH  DAILY 90 tablet 1  . PROAIR HFA 108 (90 Base) MCG/ACT inhaler 1-2 PUFFS EVERY 4-6 HOURS AS NEEEDED COUGH, FOR SHORTNESS OF BREATH , WHEEZING  5  . temazepam (RESTORIL) 30 MG capsule Take 30 mg by mouth at bedtime.     . traZODone (DESYREL) 50 MG tablet Take 50 mg by mouth at bedtime.     Marland Kitchen UNABLE TO FIND Apply 1 application topically 3 (three) times daily. Med Name: 0.3% Nifedipine + 1.5% Lidocaine in [facility] Apply to Anorectal Area    .  venlafaxine XR (EFFEXOR-XR) 150 MG 24 hr capsule Take 1 capsule by mouth 1 day or 1 dose.    . promethazine (PHENERGAN) 12.5 MG tablet Take 12.5 mg by mouth every 6 (six) hours as needed for nausea or vomiting.    . sucralfate (CARAFATE) 1 g tablet Take 1 tablet by mouth 3 (three) times daily with meals.  0   No current facility-administered medications for this visit.     OBJECTIVE: Vitals:   08/29/17 1424  BP: 126/78  Pulse: (!) 102  Temp: (!) 97.2 F (36.2 C)     Body mass index is 32.39 kg/m.    ECOG FS:0 - Asymptomatic  General: Well-developed, well-nourished, no acute distress. Eyes: Pink conjunctiva, anicteric sclera. Breasts: Patient declined breast exam today. Lungs: Clear to auscultation bilaterally. Heart: Regular rate and rhythm. No rubs, murmurs, or gallops. Abdomen: Soft, nontender, nondistended. No organomegaly noted, normoactive bowel sounds. Musculoskeletal: No edema, cyanosis, or clubbing. Neuro: Alert, answering all questions appropriately. Cranial nerves grossly intact. Skin: No rashes or petechiae noted. Psych: Normal affect.  LAB RESULTS:  Lab Results  Component Value Date   NA 135 12/23/2016   K 4.1 12/23/2016   CL 102 12/23/2016   CO2 26 12/23/2016   GLUCOSE 89 12/23/2016   BUN 8 12/23/2016   CREATININE 1.17  (H) 12/23/2016   CALCIUM 9.3 12/23/2016   PROT 6.0 (L) 12/22/2016   ALBUMIN 3.3 (L) 12/22/2016   AST 18 12/22/2016   ALT 15 12/22/2016   ALKPHOS 76 12/22/2016   BILITOT 0.8 12/22/2016   GFRNONAA 47 (L) 12/23/2016   GFRAA 55 (L) 12/23/2016    Lab Results  Component Value Date   WBC 5.1 03/07/2017   NEUTROABS 5.1 11/02/2016   HGB 11.1 (L) 03/07/2017   HCT 32.1 (L) 03/07/2017   MCV 92.0 03/07/2017   PLT 370 03/07/2017     STUDIES: No results found.  ASSESSMENT: Pathologic stage IIIa triple positive invasive carcinoma of the lower inner quadrant of the right breast.   PLAN:    1. Pathologic stage IIIa triple positive invasive carcinoma of the lower inner quadrant of the right breast: Patient has now completed all of her treatments including XRT. She only completed 14 of 18 infusions of Herceptin secondary to persistent nausea. Her CA 27.29 continues to be within normal limits. Continue letrozole completing 5 years of treatment in October 2020. Patient's most recent mammogram on October 10, 2016 was reported as BI-RADS 1. Repeat in March 2019.  Return to clinic in 6 months for laboratory work and further evaluation. 2. Osteoporosis:  Bone mineral density on September 19, 2016 reported a T score of -2.4 which is slightly worse than from December 2016 where T score was reported as -2.2. Patient was instructed to continue calcium and vitamin D supplementation.  Repeat in March 2019. 3.  Anxiety: Continue Xanax as needed.  4.  Port removal: Patient was given a referral to surgery.  Approximately 20 minutes was spent in discussion of which greater than 50% was consultation.  Patient expressed understanding and was in agreement with this plan. She also understands that She can call clinic at any time with any questions, concerns, or complaints.   Breast cancer of lower-inner quadrant of right female breast   Staging form: Breast, AJCC 7th Edition     Pathologic stage from 02/24/2015: Stage IIIA  (T1c, N2b, cM0) - Signed by Lloyd Huger, MD on 02/24/2015   Lloyd Huger, MD 09/01/17 8:13 AM

## 2017-08-28 ENCOUNTER — Other Ambulatory Visit: Payer: Self-pay | Admitting: *Deleted

## 2017-08-28 DIAGNOSIS — C50919 Malignant neoplasm of unspecified site of unspecified female breast: Secondary | ICD-10-CM

## 2017-08-29 ENCOUNTER — Inpatient Hospital Stay (HOSPITAL_BASED_OUTPATIENT_CLINIC_OR_DEPARTMENT_OTHER): Payer: Medicare Other | Admitting: Oncology

## 2017-08-29 ENCOUNTER — Encounter: Payer: Self-pay | Admitting: Oncology

## 2017-08-29 ENCOUNTER — Other Ambulatory Visit: Payer: Self-pay

## 2017-08-29 ENCOUNTER — Inpatient Hospital Stay: Payer: Medicare Other | Attending: Oncology

## 2017-08-29 VITALS — BP 126/78 | HR 102 | Temp 97.2°F | Wt 200.7 lb

## 2017-08-29 DIAGNOSIS — Z170 Estrogen receptor positive status [ER+]: Secondary | ICD-10-CM

## 2017-08-29 DIAGNOSIS — M810 Age-related osteoporosis without current pathological fracture: Secondary | ICD-10-CM | POA: Insufficient documentation

## 2017-08-29 DIAGNOSIS — Z95828 Presence of other vascular implants and grafts: Secondary | ICD-10-CM

## 2017-08-29 DIAGNOSIS — Z780 Asymptomatic menopausal state: Secondary | ICD-10-CM

## 2017-08-29 DIAGNOSIS — F419 Anxiety disorder, unspecified: Secondary | ICD-10-CM

## 2017-08-29 DIAGNOSIS — C50311 Malignant neoplasm of lower-inner quadrant of right female breast: Secondary | ICD-10-CM | POA: Diagnosis present

## 2017-08-29 DIAGNOSIS — C50919 Malignant neoplasm of unspecified site of unspecified female breast: Secondary | ICD-10-CM

## 2017-08-29 MED ORDER — SODIUM CHLORIDE 0.9% FLUSH
10.0000 mL | INTRAVENOUS | Status: AC | PRN
  Administered 2017-08-29: 10 mL
  Filled 2017-08-29: qty 10

## 2017-08-29 MED ORDER — HEPARIN SOD (PORK) LOCK FLUSH 100 UNIT/ML IV SOLN
500.0000 [IU] | INTRAVENOUS | Status: AC | PRN
  Administered 2017-08-29: 500 [IU]

## 2017-08-30 LAB — CANCER ANTIGEN 27.29: CA 27.29: 31.3 U/mL (ref 0.0–38.6)

## 2017-09-03 ENCOUNTER — Encounter: Payer: Self-pay | Admitting: Surgery

## 2017-09-03 ENCOUNTER — Ambulatory Visit (INDEPENDENT_AMBULATORY_CARE_PROVIDER_SITE_OTHER): Payer: Medicare Other | Admitting: Surgery

## 2017-09-03 VITALS — BP 126/82 | HR 96 | Temp 97.8°F | Wt 200.0 lb

## 2017-09-03 DIAGNOSIS — K602 Anal fissure, unspecified: Secondary | ICD-10-CM | POA: Diagnosis not present

## 2017-09-03 NOTE — Patient Instructions (Signed)
We will see you back in 3 weeks to remove your port-a-cath.

## 2017-09-05 NOTE — Progress Notes (Signed)
Outpatient Surgical Follow Up  09/05/2017  Abigail RULLO is an 67 y.o. female.   Chief Complaint  Patient presents with  . Follow-up    Sphincterotomy 03/14/17 Dr.Luzmaria Devaux    HPI: pt improving from fissure, some pain but significant improvement. Now wishes to have her port remove. No fevers or chills  Past Medical History:  Diagnosis Date  . Arthritis   . Breast cancer [facility]) 2014   Right breast cancer - chemo, radiation and Mastectomy  . CHF (congestive heart failure) ([place]) 2013  . Clotting disorder ([place])    blood clots  . GERD (gastroesophageal reflux disease)   . GI bleed   . Heart attack [facility]) 2012   coronary stent x2 completed by Dr. Clayborn Bigness.  Marland Kitchen Heart attack ([place]) Apr 29, 2013  . Hemorrhoids   . Hypertension   . Hypothyroidism   . IBS (irritable bowel syndrome)   . Malignant neoplasm of lower-inner quadrant of female breast [facility]) January 20, 2013.   invasive mammary cancer, minimal 0.85 cm.  Histologic grade 1.  . Personal history of chemotherapy 2014   BREAST CA  . Personal history of radiation therapy 2014   BREAST CA  . Thyroid disease     Past Surgical History:  Procedure Laterality Date  . BOTOX INJECTION N/A 03/14/2017   Procedure: BOTOX INJECTION;  Surgeon: Jules Husbands, MD;  Location: [facility] ORS;  Service: General;  Laterality: N/A;  . BREAST SURGERY Right 02-21-2013   right mastectomy  . CARDIAC CATHETERIZATION    . COLONOSCOPY W/ BIOPSIES  09/15/2010   Colonoscopy completed by Verdie Shire, M.D. Tubular adenoma of the ascending colon and descending colon reported up to 0.4 cm in diameter. No atypia.  . COLONOSCOPY WITH PROPOFOL N/A 12/23/2016   Procedure: COLONOSCOPY WITH PROPOFOL;  Surgeon: Wilford Corner, MD;  Location: [facility] ENDOSCOPY;  Service: Endoscopy;  Laterality: N/A;  . ESOPHAGOGASTRODUODENOSCOPY N/A 12/13/2016   Procedure: ESOPHAGOGASTRODUODENOSCOPY (EGD);  Surgeon: [name], MD;  Location: [facility] ENDOSCOPY;  Service: Endoscopy;  Laterality: N/A;  .  MASTECTOMY Right 2014   BREAST CA  . PORTA CATH INSERTION    . UPPER GI ENDOSCOPY  09/15/2010     Completed by Verdie Shire, M.D. for nausea. Normal exam reported.  Marland Kitchen VASCULAR SURGERY      Family History  Problem Relation Age of Onset  . Cancer Mother 4       breast  . Breast cancer Mother 28  . Cancer Father        colon    Social History:  reports that she has quit smoking. She quit after 20.00 years of use. she has never used smokeless tobacco. She reports that she does not drink alcohol or use drugs.  Allergies: No Known Allergies  Medications reviewed.    ROS Full ROS performed and is otherwise negative other than what is stated in HPI   BP 126/82   Pulse 96   Temp 97.8 F (36.6 C) (Oral)   Wt 90.7 kg (200 lb)   BMI 32.28 kg/m   Physical Exam  Constitutional: She is well-developed, well-nourished, and in no distress. No distress.  Neck: Normal range of motion. No JVD present.  Pulmonary/Chest: Effort normal. No stridor. No respiratory distress. She exhibits no tenderness.  Port in place left chest wall, no infection  Abdominal: Soft. She exhibits no distension. There is no tenderness. There is no rebound and no guarding.  Skin: She is not diaphoretic.  Psychiatric: Mood, memory, affect and  judgment normal.  Nursing note and vitals reviewed.    Assessment/Plan:  1. Anal fissure Doing well, continue nifedipine and sitz baths Port removal, she is on plavix for CAD and stent.  We will schedule for removal in the next few weeks, pt to hold plavix for 7 days    Greater than 50% of the 25 minutes  visit was spent in counseling/coordination of care   Caroleen Hamman, MD [place] Surgeon

## 2017-09-27 ENCOUNTER — Ambulatory Visit: Payer: Self-pay | Admitting: Surgery

## 2017-09-28 ENCOUNTER — Emergency Department: Payer: Medicare Other

## 2017-09-28 ENCOUNTER — Other Ambulatory Visit: Payer: Self-pay

## 2017-09-28 ENCOUNTER — Encounter: Payer: Self-pay | Admitting: Emergency Medicine

## 2017-09-28 ENCOUNTER — Emergency Department
Admission: EM | Admit: 2017-09-28 | Discharge: 2017-09-28 | Disposition: A | Discharge status: Home / Self Care | Payer: Medicare Other | Attending: Emergency Medicine | Admitting: Emergency Medicine

## 2017-09-28 DIAGNOSIS — Z87891 Personal history of nicotine dependence: Secondary | ICD-10-CM | POA: Insufficient documentation

## 2017-09-28 DIAGNOSIS — Z7902 Long term (current) use of antithrombotics/antiplatelets: Secondary | ICD-10-CM | POA: Insufficient documentation

## 2017-09-28 DIAGNOSIS — Z7982 Long term (current) use of aspirin: Secondary | ICD-10-CM | POA: Insufficient documentation

## 2017-09-28 DIAGNOSIS — I509 Heart failure, unspecified: Secondary | ICD-10-CM | POA: Diagnosis not present

## 2017-09-28 DIAGNOSIS — Z79899 Other long term (current) drug therapy: Secondary | ICD-10-CM | POA: Insufficient documentation

## 2017-09-28 DIAGNOSIS — R42 Dizziness and giddiness: Secondary | ICD-10-CM | POA: Diagnosis not present

## 2017-09-28 DIAGNOSIS — I110 Hypertensive heart disease with heart failure: Secondary | ICD-10-CM | POA: Diagnosis not present

## 2017-09-28 DIAGNOSIS — I2510 Atherosclerotic heart disease of native coronary artery without angina pectoris: Secondary | ICD-10-CM | POA: Insufficient documentation

## 2017-09-28 DIAGNOSIS — Z955 Presence of coronary angioplasty implant and graft: Secondary | ICD-10-CM | POA: Insufficient documentation

## 2017-09-28 DIAGNOSIS — E039 Hypothyroidism, unspecified: Secondary | ICD-10-CM | POA: Insufficient documentation

## 2017-09-28 DIAGNOSIS — Z853 Personal history of malignant neoplasm of breast: Secondary | ICD-10-CM | POA: Diagnosis not present

## 2017-09-28 LAB — CBC
HCT: 33.9 % — ABNORMAL LOW (ref 35.0–47.0)
HEMOGLOBIN: 11.1 g/dL — AB (ref 12.0–16.0)
MCH: 29.6 pg (ref 26.0–34.0)
MCHC: 32.7 g/dL (ref 32.0–36.0)
MCV: 90.5 fL (ref 80.0–100.0)
PLATELETS: 353 10*3/uL (ref 150–440)
RBC: 3.75 MIL/uL — AB (ref 3.80–5.20)
RDW: 13.8 % (ref 11.5–14.5)
WBC: 9.3 10*3/uL (ref 3.6–11.0)

## 2017-09-28 LAB — BASIC METABOLIC PANEL
ANION GAP: 9 (ref 5–15)
BUN: 19 mg/dL (ref 6–20)
CALCIUM: 9.5 mg/dL (ref 8.9–10.3)
CO2: 25 mmol/L (ref 22–32)
CREATININE: 1.26 mg/dL — AB (ref 0.44–1.00)
Chloride: 104 mmol/L (ref 101–111)
GFR, EST AFRICAN AMERICAN: 50 mL/min — AB (ref >60)
GFR, EST NON AFRICAN AMERICAN: 43 mL/min — AB (ref >60)
Glucose, Bld: 125 mg/dL — ABNORMAL HIGH (ref 65–99)
Potassium: 4.3 mmol/L (ref 3.5–5.1)
SODIUM: 138 mmol/L (ref 135–145)

## 2017-09-28 LAB — GLUCOSE, CAPILLARY: GLUCOSE-CAPILLARY: 151 mg/dL — AB (ref 65–99)

## 2017-09-28 MED ORDER — HEPARIN SOD (PORK) LOCK FLUSH 100 UNIT/ML IV SOLN
500.0000 [IU] | Freq: Once | INTRAVENOUS | Status: AC
  Administered 2017-09-28: 500 [IU] via INTRAVENOUS
  Filled 2017-09-28: qty 5

## 2017-09-28 MED ORDER — MECLIZINE HCL 25 MG PO TABS: 50.0000 mg | ORAL_TABLET | Freq: Three times a day (TID) | ORAL | 1 refills | Status: DC | PRN

## 2017-09-28 MED ORDER — ONDANSETRON HCL 4 MG/2ML IJ SOLN
4.0000 mg | Freq: Once | INTRAMUSCULAR | Status: AC
  Administered 2017-09-28: 4 mg via INTRAVENOUS
  Filled 2017-09-28: qty 2

## 2017-09-28 NOTE — ED Notes (Signed)
Paged MRI tech

## 2017-09-28 NOTE — ED Triage Notes (Signed)
Pt in via [facility] from home, reports waking up this morning with "the room spinning" and unable to see due to blurred vision.  Pt with associated N/V.  Last known well time 0100 3/15.  Pt A/Ox4.  Zofran 4mg  given via EMS.

## 2017-09-28 NOTE — ED Notes (Signed)
Lab Tech un able to obtain labs at this time. RN received verbal orders to give pt a 1000 mol bolus. RN administered at this time.

## 2017-09-28 NOTE — Discharge Instructions (Signed)
Your labs, CT scan of the head, and MRI of the brain were all unremarkable today.  There are no signs of stroke or other sudden illnesses related to your brain. Follow up with your doctor for continued monitoring of your symptoms. Meclizine may help decrease your symptoms, as will good hydration and sleep.

## 2017-09-28 NOTE — ED Provider Notes (Signed)
[facility] Emergency Department Provider Note  ____________________________________________  Time seen: Approximately 6:42 PM  I have reviewed the triage vital signs and the nursing notes.   HISTORY  Chief Complaint Dizziness    HPI Abigail Ortega is a 67 y.o. female who complains of dizziness that started when she woke up this morning. She was in her usual state of health when she went to sleep. Worse with turning the head or changing position, no lightheadedness or syncope. No fevers chills body aches chest pain shortness of breath. No vision changes. No headache or trauma. Denies any history of vertigo type symptoms before.  patient reports feeling very unsteady on her feet today because everything's in motion.   Past Medical History:  Diagnosis Date  . Arthritis   . Breast cancer [facility]) 2014   Right breast cancer - chemo, radiation and Mastectomy  . CHF (congestive heart failure) ([place]) 2013  . Clotting disorder ([place])    blood clots  . GERD (gastroesophageal reflux disease)   . GI bleed   . Heart attack [facility]) 2012   coronary stent x2 completed by Dr. Clayborn Bigness.  Marland Kitchen Heart attack ([place]) Apr 29, 2013  . Hemorrhoids   . Hypertension   . Hypothyroidism   . IBS (irritable bowel syndrome)   . Malignant neoplasm of lower-inner quadrant of female breast [facility]) January 20, 2013.   invasive mammary cancer, minimal 0.85 cm.  Histologic grade 1.  . Personal history of chemotherapy 2014   BREAST CA  . Personal history of radiation therapy 2014   BREAST CA  . Thyroid disease      Patient Active Problem List   Diagnosis Date Noted  . Fissure in ano   . Personal history of colonic polyps 12/23/2016  . Hyponatremia 12/21/2016  . GIB (gastrointestinal bleeding) 12/12/2016  . Depressive disorder 01/14/2016  . Headache 01/14/2016  . Joint pain 01/14/2016  . Myocardial infarction ([place]) 01/14/2016  . Obesity (BMI 35.0-39.9 without comorbidity) 08/20/2015  .  Anal pain 01/13/2015  . Anxiety 12/16/2013  . Breast cancer ([place]) 12/16/2013  . CAD (coronary artery disease) 12/16/2013  . Hyperlipidemia, unspecified 12/16/2013  . Hypertension 12/16/2013  . Hypothyroid 12/16/2013  . Nausea 12/16/2013  . Acquired absence of breast 03/04/2013  . Congestive heart failure ([place]) 03/04/2013  . Breast cancer of lower-inner quadrant of right female breast ([place]) 01/28/2013     Past Surgical History:  Procedure Laterality Date  . BOTOX INJECTION N/A 03/14/2017   Procedure: BOTOX INJECTION;  Surgeon: Jules Husbands, MD;  Location: [facility] ORS;  Service: General;  Laterality: N/A;  . BREAST SURGERY Right 02-21-2013   right mastectomy  . CARDIAC CATHETERIZATION    . COLONOSCOPY W/ BIOPSIES  09/15/2010   Colonoscopy completed by Verdie Shire, M.D. Tubular adenoma of the ascending colon and descending colon reported up to 0.4 cm in diameter. No atypia.  . COLONOSCOPY WITH PROPOFOL N/A 12/23/2016   Procedure: COLONOSCOPY WITH PROPOFOL;  Surgeon: Wilford Corner, MD;  Location: [facility] ENDOSCOPY;  Service: Endoscopy;  Laterality: N/A;  . ESOPHAGOGASTRODUODENOSCOPY N/A 12/13/2016   Procedure: ESOPHAGOGASTRODUODENOSCOPY (EGD);  Surgeon: [name], MD;  Location: [facility] ENDOSCOPY;  Service: Endoscopy;  Laterality: N/A;  . MASTECTOMY Right 2014   BREAST CA  . PORTA CATH INSERTION    . UPPER GI ENDOSCOPY  09/15/2010     Completed by Verdie Shire, M.D. for nausea. Normal exam reported.  Marland Kitchen VASCULAR SURGERY       Prior to Admission medications  Medication Sig Start Date End Date Taking? Authorizing Provider  acetaminophen (TYLENOL 8 HOUR ARTHRITIS PAIN) 650 MG CR tablet Take 1,300 mg by mouth every 8 (eight) hours as needed for pain.    Yes [provider]  ALPRAZolam (XANAX) 0.25 MG tablet Take 0.25 mg by mouth 2 (two) times daily as needed for anxiety.  04/23/15  Yes [provider]  aspirin EC 81 MG tablet Take 81 mg by mouth daily.   Yes [provider]   atorvastatin (LIPITOR) 40 MG tablet Take 1 tablet by mouth once daily with breakfast 06/08/15  Yes [provider]  azelastine (ASTELIN) 0.1 % nasal spray Place 1 spray into both nostrils 2 (two) times daily. 04/10/17  Yes [provider]  cholecalciferol (VITAMIN D) 1000 units tablet Take 1,000 Units by mouth daily.   Yes [provider]  clopidogrel (PLAVIX) 75 MG tablet take 1 tablet daily with breakfast   Yes [provider]  [employer] SALINE NOSE SPRAY 0.65 % nasal spray Place 1-2 sprays into the nose daily. 02/23/17  Yes [provider]  fexofenadine (ALLEGRA) 60 MG tablet Take 30 mg by mouth daily.   Yes [provider]  fluticasone (FLONASE) 50 MCG/ACT nasal spray Place 1 spray into both nostrils daily as needed for allergies or rhinitis.  09/11/16  Yes [provider]  gabapentin (NEURONTIN) 300 MG capsule Take 1 capsule (300 mg total) by mouth 3 (three) times daily. 04/05/17  Yes Pabon, Diego F, MD  hydrALAZINE (APRESOLINE) 25 MG tablet Take 25 mg by mouth 3 (three) times daily.   Yes [provider]  HYDROcodone-acetaminophen (NORCO/VICODIN) 5-325 MG tablet Take 1-2 tablets by mouth every 6 (six) hours as needed for moderate pain. 03/14/17  Yes Pabon, Diego F, MD  isosorbide mononitrate (IMDUR) 30 MG 24 hr tablet Take 30 mg by mouth daily with breakfast.   Yes [provider]  letrozole ([place]) 2.5 MG tablet TAKE 1 TABLET BY MOUTH  DAILY 05/03/17  Yes Lloyd Huger, MD  levothyroxine (SYNTHROID, LEVOTHROID) 75 MCG tablet Take 75 mcg by mouth daily before breakfast.   Yes [provider]  montelukast (SINGULAIR) 10 MG tablet Take 10 mg by mouth at bedtime.   Yes [provider]  Multiple Vitamin (MULTIVITAMIN WITH MINERALS) TABS tablet Take 1 tablet by mouth daily.   Yes [provider]  Olopatadine HCl 0.2 % SOLN INSTILL 1 DROP IN [facility] EYE DAILY 12/19/16  Yes [provider]   pantoprazole (PROTONIX) 40 MG tablet TAKE 1 TABLET BY MOUTH  DAILY 05/03/17  Yes Lloyd Huger, MD  PROAIR HFA 108 (90 Base) MCG/ACT inhaler 1-2 PUFFS EVERY 4-6 HOURS AS NEEEDED COUGH, FOR SHORTNESS OF BREATH , WHEEZING 12/19/16  Yes [provider]  promethazine (PHENERGAN) 12.5 MG tablet Take 12.5 mg by mouth every 6 (six) hours as needed for nausea or vomiting.   Yes [provider]  sucralfate (CARAFATE) 1 g tablet Take 1 tablet by mouth 3 (three) times daily with meals. 03/21/17  Yes [provider]  temazepam (RESTORIL) 30 MG capsule Take 30 mg by mouth at bedtime.  02/04/17  Yes [provider]  traZODone (DESYREL) 50 MG tablet Take 50 mg by mouth at bedtime.  10/05/14  Yes [provider]  meclizine (ANTIVERT) 25 MG tablet Take 2 tablets (50 mg total) by mouth 3 (three) times daily as needed for dizziness or nausea. 09/28/17   Carrie Mew, MD  UNABLE TO  FIND Apply 1 application topically 3 (three) times daily. Med Name: 0.3% Nifedipine + 1.5% Lidocaine in [employer] Apply to Anorectal Area    Pabon, [place], MD     Allergies Patient has no known allergies.   Family History  Problem Relation Age of Onset  . Cancer Mother 52       breast  . Breast cancer Mother 86  . Cancer Father        colon    Social History Social History   Tobacco Use  . Smoking status: Former Smoker    Years: 20.00  . Smokeless tobacco: Never Used  Substance Use Topics  . Alcohol use: No  . Drug use: No    Review of Systems  Constitutional:   No fever or chills.  ENT:   No sore throat. No rhinorrhea. Cardiovascular:   No chest pain or syncope. Respiratory:   No dyspnea or cough. Gastrointestinal:   Negative for abdominal pain, vomiting and diarrhea.  Musculoskeletal:   Negative for focal pain or swelling All other systems reviewed and are negative except as documented above in ROS and  HPI.  ____________________________________________   PHYSICAL EXAM:  VITAL SIGNS: ED Triage Vitals  Enc Vitals Group     BP 09/28/17 1307 124/81     Pulse Rate 09/28/17 1311 73     Resp 09/28/17 1311 (!) 21     Temp --      Temp src --      SpO2 09/28/17 1311 98 %     Weight 09/28/17 1302 200 lb (90.7 kg)     Height 09/28/17 1302 5\' 6"  (1.676 m)     Head Circumference --      Peak Flow --      Pain Score 09/28/17 1301 8     Pain Loc --      Pain Edu? --      Excl. in [place]? --     Vital signs reviewed, nursing assessments reviewed.   Constitutional:   Alert and oriented. Well appearing and in no distress. Eyes:   No scleral icterus.  EOMI. No nystagmus. No conjunctival pallor. PERRL.negative test of skew ENT   Head:   Normocephalic and atraumatic.   Nose:   No congestion/rhinnorhea.    Mouth/Throat:   MMM, no pharyngeal erythema. No peritonsillar mass.    Neck:   No meningismus. Full ROM. Hematological/Lymphatic/Immunilogical:   No cervical lymphadenopathy. Cardiovascular:   RRR. Symmetric bilateral radial and DP pulses.  No murmurs.  Respiratory:   Normal respiratory effort without tachypnea/retractions. Breath sounds are clear and equal bilaterally. No wheezes/rales/rhonchi. Gastrointestinal:   Soft and nontender. Non distended. There is no CVA tenderness.  No rebound, rigidity, or guarding. Genitourinary:   deferred Musculoskeletal:   Normal range of motion in all extremities. No joint effusions.  No lower extremity tenderness.  No edema. Neurologic:   Normal speech and language.  cranial nerves III through XII intact. Cerebellar function intact. Motor grossly intact. No acute focal neurologic deficits are appreciated.  Skin:    Skin is warm, dry and intact. No rash noted.  No petechiae, purpura, or bullae.  ____________________________________________    LABS (pertinent positives/negatives) (all labs ordered are listed, but only abnormal results are  displayed) Labs Reviewed  BASIC METABOLIC PANEL - Abnormal; Notable for the following components:      Result Value   Glucose, Bld 125 (*)    Creatinine, Ser 1.26 (*)    GFR calc non  Af Amer 43 (*)    GFR calc Af Amer 50 (*)    All other components within normal limits  CBC - Abnormal; Notable for the following components:   RBC 3.75 (*)    Hemoglobin 11.1 (*)    HCT 33.9 (*)    All other components within normal limits  GLUCOSE, CAPILLARY - Abnormal; Notable for the following components:   Glucose-Capillary 151 (*)    All other components within normal limits  URINALYSIS, COMPLETE (UACMP) WITH MICROSCOPIC  CBG MONITORING, ED   ____________________________________________   EKG  interpreted by me Sinus rhythm rate of 73, left axis, normal intervals. lateral Q waves, no acute ischemic changes.  ____________________________________________    RADIOLOGY  Ct Head Wo Contrast  Result Date: 09/28/2017 CLINICAL DATA:  Severe headache and vertigo for several days. History of breast cancer. EXAM: CT HEAD WITHOUT CONTRAST TECHNIQUE: Contiguous axial images were obtained from the base of the skull through the vertex without intravenous contrast. COMPARISON:  Brain MRI 04/14/2016 FINDINGS: Brain: There is no evidence of acute infarct, intracranial hemorrhage, mass, midline shift, or extra-axial fluid collection. The ventricles and sulci are normal. Vascular: Calcified atherosclerosis at the skull base. No hyperdense vessel. Skull: No fracture focal osseous lesion. Sinuses/Orbits: Visualized paranasal sinuses and mastoid air cells are clear. Visualized orbits are unremarkable. Other: None. IMPRESSION: No evidence of acute intracranial abnormality. Unremarkable CT appearance of the brain. Electronically Signed   By: Logan Bores M.D.   On: 09/28/2017 15:00   Mr Brain Wo Contrast  Result Date: 09/28/2017 CLINICAL DATA:  67 y/o F; room spinning, unable to see due to blurred vision. Nausea and  vomiting. EXAM: MRI HEAD WITHOUT CONTRAST TECHNIQUE: Multiplanar, multiecho pulse sequences of the brain and surrounding structures were obtained without intravenous contrast. COMPARISON:  09/28/2017 CT head.  04/14/2016 in 08/29/2014 MRI head. FINDINGS: Brain: No acute infarction, hemorrhage, hydrocephalus, extra-axial collection or mass lesion. Few stablenonspecific foci of T2 FLAIR hyperintense signal abnormality in subcortical and periventricular white matter are compatible withmildchronic microvascular ischemic changes for age. Stable focus of susceptibility hypointensity left posterior frontal periventricular white matter compatible hemosiderin deposition of chronic microhemorrhage. Vascular: Normal flow voids. Skull and upper cervical spine: Normal marrow signal. Sinuses/Orbits: Negative. Other: None. IMPRESSION: 1. No acute intracranial abnormality. No finding as explanation for symptoms. 2. Stable mild chronic microvascular ischemic changes. Electronically Signed   By: Kristine Garbe M.D.   On: 09/28/2017 20:03    ____________________________________________   PROCEDURES Procedures  ____________________________________________    CLINICAL IMPRESSION / ASSESSMENT AND PLAN / ED COURSE  Pertinent labs & imaging results that were available during my care of the patient were reviewed by me and considered in my medical decision making (see chart for details).   patient well-appearing no acute distress, presents with symptoms consistent with vertigo. As she has never had anything like this before, has risk factors for stroke including age and hypertension, I'll obtain labs, CT head to evaluate for dehydration, electrolyte abnormality, intracranial mass or hemorrhage. If initial workup is unremarkable, with plan to proceed with MRI brain to evaluate for acute stroke. If workup negative, I think patient can be discharged home with symptomatic management with meclizine to follow up with  primary care.  Considering the patient's symptoms, medical history, and physical examination today, I have low suspicion for ischemic stroke, intracranial hemorrhage, meningitis, encephalitis, carotid or vertebral dissection, venous sinus thrombosis, MS, intracranial hypertension, glaucoma, CRAO, CRVO, or temporal arteritis.    ----------------------------------------- 8:11 PM  on 09/28/2017 -----------------------------------------  Labs and CT head were unremarkable, no evidence of intracranial hemorrhage or acute stroke or intracranial mass. Proceeded with an MRI which is also negative without any evidence of stroke. Patient is suitable for discharge home to follow up with her primary care doctor, meclizine trial.     ____________________________________________   FINAL CLINICAL IMPRESSION(S) / ED DIAGNOSES    Final diagnoses:  Vertigo     ED Discharge Orders        Ordered    meclizine (ANTIVERT) 25 MG tablet  3 times daily PRN     09/28/17 2010      Portions of this note were generated with dragon dictation software. Dictation errors may occur despite best attempts at proofreading.    Carrie Mew, MD 09/28/17 2012

## 2017-09-28 NOTE — ED Notes (Signed)
Per Charge permission This RN notifeid Lab of the need for lab draw as 3x RN have attempted and unsuccessful.

## 2017-09-28 NOTE — ED Notes (Signed)
Waiting for MRI

## 2017-09-28 NOTE — ED Notes (Signed)
Port accessed by [employer]

## 2017-10-04 ENCOUNTER — Telehealth: Payer: Self-pay

## 2017-10-04 NOTE — Telephone Encounter (Signed)
Patient called and spoke to [name] cancelling her procedure (port-a-cath removal) with Dr. [name] since she stated that she went to the ED for vertigo. However, [name] told her that she could give [place] a call before the end of April to reschedule her procedure with Dr. [name]. Patient agreed and stated that she would call us back to reschedule.

## 2017-10-08 ENCOUNTER — Ambulatory Visit: Payer: Self-pay | Admitting: Surgery

## 2017-10-15 ENCOUNTER — Ambulatory Visit
Admission: RE | Admit: 2017-10-15 | Discharge: 2017-10-15 | Disposition: A | Discharge status: Home / Self Care | Payer: Medicare Other | Source: Ambulatory Visit | Attending: Oncology | Admitting: Oncology

## 2017-10-15 ENCOUNTER — Other Ambulatory Visit: Payer: Self-pay | Admitting: Oncology

## 2017-10-15 DIAGNOSIS — Z1231 Encounter for screening mammogram for malignant neoplasm of breast: Secondary | ICD-10-CM | POA: Insufficient documentation

## 2017-10-15 DIAGNOSIS — M810 Age-related osteoporosis without current pathological fracture: Secondary | ICD-10-CM | POA: Insufficient documentation

## 2017-10-15 DIAGNOSIS — Z780 Asymptomatic menopausal state: Secondary | ICD-10-CM | POA: Diagnosis present

## 2017-10-15 DIAGNOSIS — Z853 Personal history of malignant neoplasm of breast: Secondary | ICD-10-CM | POA: Insufficient documentation

## 2017-10-15 DIAGNOSIS — C50311 Malignant neoplasm of lower-inner quadrant of right female breast: Secondary | ICD-10-CM

## 2017-10-15 DIAGNOSIS — Z170 Estrogen receptor positive status [ER+]: Secondary | ICD-10-CM

## 2017-10-15 MED ORDER — ALENDRONATE SODIUM 70 MG PO TABS: 70.0000 mg | ORAL_TABLET | ORAL | 3 refills | Status: DC

## 2017-11-13 ENCOUNTER — Encounter: Payer: Self-pay | Admitting: *Deleted

## 2017-11-27 ENCOUNTER — Other Ambulatory Visit: Payer: Self-pay | Admitting: Oncology

## 2017-11-29 ENCOUNTER — Other Ambulatory Visit: Payer: Self-pay | Admitting: Nurse Practitioner

## 2017-11-29 DIAGNOSIS — Z1231 Encounter for screening mammogram for malignant neoplasm of breast: Secondary | ICD-10-CM

## 2017-12-31 DIAGNOSIS — J309 Allergic rhinitis, unspecified: Secondary | ICD-10-CM

## 2017-12-31 HISTORY — DX: Allergic rhinitis, unspecified: J30.9

## 2018-01-21 ENCOUNTER — Other Ambulatory Visit: Payer: Self-pay

## 2018-01-21 ENCOUNTER — Ambulatory Visit: Payer: Self-pay | Admitting: Surgery

## 2018-01-22 ENCOUNTER — Encounter: Payer: Self-pay | Admitting: Surgery

## 2018-01-22 ENCOUNTER — Ambulatory Visit (INDEPENDENT_AMBULATORY_CARE_PROVIDER_SITE_OTHER): Payer: Medicare Other | Admitting: Surgery

## 2018-01-22 VITALS — BP 105/71 | HR 91 | Temp 97.7°F | Ht 66.0 in | Wt 201.2 lb

## 2018-01-22 DIAGNOSIS — K602 Anal fissure, unspecified: Secondary | ICD-10-CM | POA: Diagnosis not present

## 2018-01-22 NOTE — Patient Instructions (Signed)
Please use the Medication Dr.Pabon has prescribed. Please see your follow up appointment listed below.   Please take sitz baths after every bowel movement.    [facility]  [address], [place] [address] [phone]

## 2018-01-22 NOTE — Progress Notes (Signed)
Outpatient Surgical Follow Up  01/22/2018  Abigail Ortega is an 67 y.o. female.   Chief Complaint  Patient presents with  . 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  Did well until a few weeks ago that she started having similar symptoms again.  She reports that now pain after having a bowel movement.  She does have some tenesmus as well.  No fevers no chills no evidence of hematochezia.  He has been prescribed a steroid cream without any improvement. She had a chemical sphincterotomy on August 2018  Past Medical History:  Diagnosis Date  . Arthritis   . Breast cancer [facility]) 2014   Right breast cancer - chemo, radiation and Mastectomy  . CHF (congestive heart failure) ([place]) 2013  . Clotting disorder ([place])    blood clots  . GERD (gastroesophageal reflux disease)   . GI bleed   . Heart attack [facility]) 2012   coronary stent x2 completed by Dr. Clayborn Bigness.  Marland Kitchen Heart attack ([place]) Apr 29, 2013  . Hemorrhoids   . Hypertension   . Hypothyroidism   . IBS (irritable bowel syndrome)   . Malignant neoplasm of lower-inner quadrant of female breast [facility]) January 20, 2013.   invasive mammary cancer, minimal 0.85 cm.  Histologic grade 1.  . Personal history of chemotherapy 2014   BREAST CA  . Personal history of radiation therapy 2014   BREAST CA  . Thyroid disease     Past Surgical History:  Procedure Laterality Date  . BOTOX INJECTION N/A 03/14/2017   Procedure: BOTOX INJECTION;  Surgeon: Jules Husbands, MD;  Location: [facility] ORS;  Service: General;  Laterality: N/A;  . BREAST BIOPSY Right 2014   positive  . BREAST SURGERY Right 02-21-2013   right mastectomy  . CARDIAC CATHETERIZATION    . COLONOSCOPY W/ BIOPSIES  09/15/2010   Colonoscopy completed by Verdie Shire, M.D. Tubular adenoma of the ascending colon and descending colon reported up to 0.4 cm in diameter. No atypia.   . COLONOSCOPY WITH PROPOFOL N/A 12/23/2016   Procedure: COLONOSCOPY WITH PROPOFOL;  Surgeon: Wilford Corner, MD;  Location[facility] ENDOSCOPY;  Service: Endoscopy;  Laterality: N/A;  . ESOPHAGOGASTRODUODENOSCOPY N/A 12/13/2016   Procedure: ESOPHAGOGASTRODUODENOSCOPY (EGD);  Surgeon: [name], MD;  Location: [facility] ENDOSCOPY;  Service: Endoscopy;  Laterality: N/A;  . MASTECTOMY Right 2014   BREAST CA  . PORTA CATH INSERTION    . UPPER GI ENDOSCOPY  09/15/2010     Completed by Verdie Shire, M.D. for nausea. Normal exam reported.  Marland Kitchen VASCULAR SURGERY      Family History  Problem Relation Age of Onset  . Breast cancer Mother 90  . Cancer Father        colon    Social History:  reports that she has quit smoking. She quit after 20.00 years of use. She has never used smokeless tobacco. She reports that she does not drink alcohol or use drugs.  Allergies: No Known Allergies  Medications reviewed.    ROS Full ROS performed and is otherwise negative other than what is stated in HPI   BP 105/71   Pulse 91   Temp 97.7 F (36.5 C) (Oral)   Ht 5\' 6"  (1.676 m)   Wt 91.3 kg (201 lb 3.2 oz)   BMI 32.47 kg/m   Physical Exam NAD, awake and alert Abd:  soft, Nt, no masses Rectal: posterior midline small fissure, tender to palpation. No masses or hemorrhoids Ext: well perfused and warm  Assessment/Plan:  1. Anal fissure Recurrent D/W the pt in detail about medical rx. Including nifedipine cream, sitz baths. Recommend to stop steroid cream as well    Caroleen Hamman, MD [place] Surgeon

## 2018-02-06 ENCOUNTER — Encounter: Payer: Self-pay | Admitting: Surgery

## 2018-02-06 ENCOUNTER — Ambulatory Visit (INDEPENDENT_AMBULATORY_CARE_PROVIDER_SITE_OTHER): Payer: Medicare Other | Admitting: Surgery

## 2018-02-06 VITALS — BP 118/74 | HR 82 | Temp 97.8°F | Wt 198.0 lb

## 2018-02-06 DIAGNOSIS — K602 Anal fissure, unspecified: Secondary | ICD-10-CM

## 2018-02-06 NOTE — Patient Instructions (Addendum)
Please start taking Benefiber or Citrucel once a day. Hopefully this will help you with your constipation.  Please continue doing the sitz baths three times a day.  Please continue to apply the rectal cream.   We will see you back in three weeks t make sure that you are getting better.

## 2018-02-07 ENCOUNTER — Encounter: Payer: Self-pay | Admitting: Surgery

## 2018-02-07 NOTE — Progress Notes (Signed)
Outpatient Surgical Follow Up  02/07/2018  Abigail Ortega is an 67 y.o. female.   Chief Complaint  Patient presents with  . Follow-up    Anal Fissure    HPI: Following up for anal fissure.  Now back on nifedipine cream and has some improvement.  Some mild pain when having a bowel movement.  Some tenesmus.  No fevers no chills she is tolerating diet.  Past Medical History:  Diagnosis Date  . Arthritis   . Breast cancer [facility]) 2014   Right breast cancer - chemo, radiation and Mastectomy  . CHF (congestive heart failure) ([place]) 2013  . Clotting disorder ([place])    blood clots  . GERD (gastroesophageal reflux disease)   . GI bleed   . Heart attack [facility]) 2012   coronary stent x2 completed by Dr. Clayborn Bigness.  Marland Kitchen Heart attack ([place]) Apr 29, 2013  . Hemorrhoids   . Hypertension   . Hypothyroidism   . IBS (irritable bowel syndrome)   . Malignant neoplasm of lower-inner quadrant of female breast [facility]) January 20, 2013.   invasive mammary cancer, minimal 0.85 cm.  Histologic grade 1.  . Personal history of chemotherapy 2014   BREAST CA  . Personal history of radiation therapy 2014   BREAST CA  . Thyroid disease     Past Surgical History:  Procedure Laterality Date  . BOTOX INJECTION N/A 03/14/2017   Procedure: BOTOX INJECTION;  Surgeon: Jules Husbands, MD;  Location: [facility] ORS;  Service: General;  Laterality: N/A;  . BREAST BIOPSY Right 2014   positive  . BREAST SURGERY Right 02-21-2013   right mastectomy  . CARDIAC CATHETERIZATION    . COLONOSCOPY W/ BIOPSIES  09/15/2010   Colonoscopy completed by Verdie Shire, M.D. Tubular adenoma of the ascending colon and descending colon reported up to 0.4 cm in diameter. No atypia.  . COLONOSCOPY WITH PROPOFOL N/A 12/23/2016   Procedure: COLONOSCOPY WITH PROPOFOL;  Surgeon: Wilford Corner, MD;  Location: [facility] ENDOSCOPY;  Service: Endoscopy;  Laterality: N/A;  . ESOPHAGOGASTRODUODENOSCOPY N/A 12/13/2016   Procedure: ESOPHAGOGASTRODUODENOSCOPY (EGD);   Surgeon: [name], MD;  Location: [facility] ENDOSCOPY;  Service: Endoscopy;  Laterality: N/A;  . MASTECTOMY Right 2014   BREAST CA  . PORTA CATH INSERTION    . UPPER GI ENDOSCOPY  09/15/2010     Completed by Verdie Shire, M.D. for nausea. Normal exam reported.  Marland Kitchen VASCULAR SURGERY      Family History  Problem Relation Age of Onset  . Breast cancer Mother 24  . Cancer Father        colon    Social History:  reports that she has quit smoking. She quit after 20.00 years of use. She has never used smokeless tobacco. She reports that she does not drink alcohol or use drugs.  Allergies: No Known Allergies  Medications reviewed.    ROS Full ROS performed and is otherwise negative other than what is stated in HPI   BP 118/74   Pulse 82   Temp 97.8 F (36.6 C) (Oral)   Wt 198 lb (89.8 kg)   BMI 31.96 kg/m   Physical Exam  Constitutional: She appears well-developed and well-nourished. No distress.  Pulmonary/Chest: Effort normal. No respiratory distress.  Abdominal: Soft. She exhibits no distension and no mass. There is no tenderness. There is no rebound and no guarding.  Genitourinary:  Genitourinary Comments: pOST MIDLINE SMALL FISSURE, PAINFUL EXAM, NO MASSES  Musculoskeletal: Normal range of motion. She exhibits no edema.  Skin: Skin is warm and dry. Capillary refill takes less than 2 seconds. She is not diaphoretic.  Psychiatric: She has a normal mood and affect. Her behavior is normal. Judgment and thought content normal.  Nursing note and vitals reviewed.  Assessment/Plan: No fissure.  Continue twice daily nifedipine cream cheerier.  She will follow-up in 3 weeks.  We had a discussion about and on the buttocks injection and she wishes to wait about 3 weeks.  Continue fiber and sitz baths for now.  No need for emergent surgical intervention at this time.  Caroleen Hamman, MD [facility] General Surgeon

## 2018-02-18 NOTE — Progress Notes (Signed)
[place]  [phone] [phone]) [phone]  ID: Durwin Reges OB: [birth year]  MR#: [id number]  LGX#:[id number]  Patient Care Team: Perrin Maltese, MD as PCP - General (Internal Medicine) Bary Castilla, Forest Gleason, MD (General Surgery) Sharene Butters, MD (General Surgery)  CHIEF COMPLAINT: Pathologic stage IIIa triple positive invasive carcinoma of the lower inner quadrant of the right breast.   INTERVAL HISTORY: Patient returns to clinic for routine six-month evaluation.  She has occasional headache.  She also has pain from an anal fissure which is being evaluated by surgery.  She otherwise feels well.  She is tolerating letrozole well without significant side effects. She has no neurologic complaints. She denies any recent fevers or illnesses. She has no chest pain or shortness of breath. She denies any nausea, vomiting, constipation, or diarrhea. She has no urinary complaints.  Patient offers no further specific complaints today.  REVIEW OF SYSTEMS:   Review of Systems  Constitutional: Negative.  Negative for chills, fever, malaise/fatigue and weight loss.  HENT: Negative.  Negative for tinnitus.   Eyes: Negative.   Respiratory: Negative.  Negative for cough and sputum production.   Cardiovascular: Negative.  Negative for chest pain and leg swelling.  Gastrointestinal: Negative.  Negative for abdominal pain, constipation, diarrhea, nausea and vomiting.  Genitourinary: Negative.  Negative for dysuria.  Musculoskeletal: Negative.  Negative for myalgias.  Skin: Negative.  Negative for rash.  Neurological: Positive for headaches. Negative for sensory change and weakness.  Psychiatric/Behavioral: The patient is nervous/anxious. The patient does not have insomnia.     As per HPI. Otherwise, a complete review of systems is negative.  PAST MEDICAL HISTORY: Past Medical History:  Diagnosis Date  . Arthritis   . Breast cancer [facility]) 2014   Right breast  cancer - chemo, radiation and Mastectomy  . CHF (congestive heart failure) ([place]) 2013  . Clotting disorder ([place])    blood clots  . GERD (gastroesophageal reflux disease)   . GI bleed   . Heart attack [facility]) 2012   coronary stent x2 completed by Dr. Clayborn Bigness.  Marland Kitchen Heart attack ([place]) Apr 29, 2013  . Hemorrhoids   . Hypertension   . Hypothyroidism   . IBS (irritable bowel syndrome)   . Malignant neoplasm of lower-inner quadrant of female breast [facility]) January 20, 2013.   invasive mammary cancer, minimal 0.85 cm.  Histologic grade 1.  . Personal history of chemotherapy 2014   BREAST CA  . Personal history of radiation therapy 2014   BREAST CA  . Thyroid disease     PAST SURGICAL HISTORY: Past Surgical History:  Procedure Laterality Date  . BOTOX INJECTION N/A 03/14/2017   Procedure: BOTOX INJECTION;  Surgeon: Jules Husbands, MD;  Location: [facility] ORS;  Service: General;  Laterality: N/A;  . BREAST BIOPSY Right 2014   positive  . BREAST SURGERY Right 02-21-2013   right mastectomy  . CARDIAC CATHETERIZATION    . COLONOSCOPY W/ BIOPSIES  09/15/2010   Colonoscopy completed by Verdie Shire, M.D. Tubular adenoma of the ascending colon and descending colon reported up to 0.4 cm in diameter. No atypia.  . COLONOSCOPY WITH PROPOFOL N/A 12/23/2016   Procedure: COLONOSCOPY WITH PROPOFOL;  Surgeon: Wilford Corner, MD;  Location: [facility] ENDOSCOPY;  Service: Endoscopy;  Laterality: N/A;  . ESOPHAGOGASTRODUODENOSCOPY N/A 12/13/2016   Procedure: ESOPHAGOGASTRODUODENOSCOPY (EGD);  Surgeon: [name], MD;  Location: [facility] ENDOSCOPY;  Service: Endoscopy;  Laterality: N/A;  . MASTECTOMY Right 2014   BREAST CA  .  PORTA CATH INSERTION    . UPPER GI ENDOSCOPY  09/15/2010     Completed by Verdie Shire, M.D. for nausea. Normal exam reported.  Marland Kitchen VASCULAR SURGERY      FAMILY HISTORY Family History  Problem Relation Age of Onset  . Breast cancer Mother 12  . Cancer Father        colon       ADVANCED DIRECTIVES:     HEALTH MAINTENANCE: Social History   Tobacco Use  . Smoking status: Former Smoker    Years: 20.00  . Smokeless tobacco: Never Used  Substance Use Topics  . Alcohol use: No  . Drug use: No     Colonoscopy:  PAP:  Bone density:  Lipid panel:  No Known Allergies  Current Outpatient Medications  Medication Sig Dispense Refill  . acetaminophen (TYLENOL 8 HOUR ARTHRITIS PAIN) 650 MG CR tablet Take 1,300 mg by mouth every 8 (eight) hours as needed for pain.     Marland Kitchen alendronate (FOSAMAX) 70 MG tablet Take 1 tablet (70 mg total) by mouth once a week. Take with a full glass of water on an empty stomach. 12 tablet 3  . ALPRAZolam (XANAX) 0.25 MG tablet Take 0.25 mg by mouth 2 (two) times daily as needed for anxiety.   2  . aspirin EC 81 MG tablet Take 81 mg by mouth daily.    Marland Kitchen atorvastatin (LIPITOR) 40 MG tablet Take 1 tablet by mouth once daily with breakfast    . azelastine (ASTELIN) 0.1 % nasal spray Place 1 spray into both nostrils 2 (two) times daily.  4  . cholecalciferol (VITAMIN D) 1000 units tablet Take 1,000 Units by mouth daily.    . clopidogrel (PLAVIX) 75 MG tablet take 1 tablet daily with breakfast    . [employer] SALINE NOSE SPRAY 0.65 % nasal spray Place 1-2 sprays into the nose daily.  11  . Fe Fum-FA-B Cmp-C-Zn-Mg-Mn-Cu (HEMOCYTE PLUS) 106-1 MG CAPS Take by mouth.    . fexofenadine (ALLEGRA) 60 MG tablet Take 30 mg by mouth daily.    . fluticasone (FLONASE) 50 MCG/ACT nasal spray Place 1 spray into both nostrils daily as needed for allergies or rhinitis.     Marland Kitchen gabapentin (NEURONTIN) 300 MG capsule Take 1 capsule (300 mg total) by mouth 3 (three) times daily. 90 capsule 0  . hydrALAZINE (APRESOLINE) 25 MG tablet Take 25 mg by mouth 3 (three) times daily.    . isosorbide mononitrate (IMDUR) 30 MG 24 hr tablet Take 30 mg by mouth daily with breakfast.    . letrozole (FEMARA) 2.5 MG tablet TAKE 1 TABLET BY MOUTH  DAILY 90 tablet 1  . levothyroxine (SYNTHROID, LEVOTHROID) 75 MCG  tablet Take 75 mcg by mouth daily before breakfast.    . montelukast (SINGULAIR) 10 MG tablet Take 10 mg by mouth at bedtime.    . Multiple Vitamin (MULTIVITAMIN WITH MINERALS) TABS tablet Take 1 tablet by mouth daily.    Marland Kitchen nystatin (MYCOSTATIN/NYSTOP) powder APPLY POWDER 3 TIMES DAILY TO RASH IN BETWEEN LEGS, UNDER STOMACH AND UNDER LEFT BREAST  1  . Olopatadine HCl 0.2 % SOLN INSTILL 1 DROP IN EACH EYE DAILY  2  . pantoprazole (PROTONIX) 40 MG tablet TAKE 1 TABLET BY MOUTH  DAILY 90 tablet 1  . PROAIR HFA 108 (90 Base) MCG/ACT inhaler 1-2 PUFFS EVERY 4-6 HOURS AS NEEEDED COUGH, FOR SHORTNESS OF BREATH , WHEEZING  5  . PROCTOSOL HC 2.5 % rectal cream APPLY CREAM  TO RECTAL REGION EVERY 8 HOURS AS NEEDED FOR HEMORRHOIDS  2  . temazepam (RESTORIL) 30 MG capsule Take 30 mg by mouth at bedtime.     . traZODone (DESYREL) 50 MG tablet Take 50 mg by mouth at bedtime.     Marland Kitchen UNABLE TO FIND Apply 1 application topically 3 (three) times daily. Med Name: 0.3% Nifedipine + 1.5% Lidocaine in [facility] Apply to Anorectal Area    . meclizine (ANTIVERT) 25 MG tablet Take 2 tablets (50 mg total) by mouth 3 (three) times daily as needed for dizziness or nausea. (Patient not taking: Reported on 02/21/2018) 30 tablet 1  . promethazine (PHENERGAN) 12.5 MG tablet Take 12.5 mg by mouth every 6 (six) hours as needed for nausea or vomiting.     No current facility-administered medications for this visit.     OBJECTIVE: Vitals:   02/21/18 1442  BP: 109/71  Pulse: 86  Resp: 18  Temp: 97.7 F (36.5 C)  SpO2: 98%     Body mass index is 32.63 kg/m.    ECOG FS:0 - Asymptomatic  General: Well-developed, well-nourished, no acute distress. Eyes: Pink conjunctiva, anicteric sclera. HEENT: Normocephalic, moist mucous membranes. Breast: Bilateral breast and axilla without lumps or masses. Lungs: Clear to auscultation bilaterally. Heart: Regular rate and rhythm. No rubs, murmurs, or gallops. Abdomen: Soft,  nontender, nondistended. No organomegaly noted, normoactive bowel sounds. Musculoskeletal: No edema, cyanosis, or clubbing. Neuro: Alert, answering all questions appropriately. Cranial nerves grossly intact. Skin: No rashes or petechiae noted. Psych: Normal affect.  LAB RESULTS:  Lab Results  Component Value Date   NA 138 09/28/2017   K 4.3 09/28/2017   CL 104 09/28/2017   CO2 25 09/28/2017   GLUCOSE 125 (H) 09/28/2017   BUN 19 09/28/2017   CREATININE 1.26 (H) 09/28/2017   CALCIUM 9.5 09/28/2017   PROT 6.0 (L) 12/22/2016   ALBUMIN 3.3 (L) 12/22/2016   AST 18 12/22/2016   ALT 15 12/22/2016   ALKPHOS 76 12/22/2016   BILITOT 0.8 12/22/2016   GFRNONAA 43 (L) 09/28/2017   GFRAA 50 (L) 09/28/2017    Lab Results  Component Value Date   WBC 9.3 09/28/2017   NEUTROABS 5.1 11/02/2016   HGB 11.1 (L) 09/28/2017   HCT 33.9 (L) 09/28/2017   MCV 90.5 09/28/2017   PLT 353 09/28/2017     STUDIES: No results found.  ASSESSMENT: Pathologic stage IIIa triple positive invasive carcinoma of the lower inner quadrant of the right breast.   PLAN:    1. Pathologic stage IIIa triple positive invasive carcinoma of the lower inner quadrant of the right breast: Patient has now completed all of her treatments including XRT. She only completed 14 of 18 infusions of Herceptin secondary to persistent nausea. Her CA 27.29 continues to be within normal limits. Continue letrozole completing 5 years of treatment in October 2020.  Patient's most recent mammogram on October 15, 2017 was reported as BI-RADS 1, repeat in 1 year.  Return to clinic in 6 months for routine evaluation.  2. Osteoporosis: Bone mineral density on October 15, 2017 reported T score of -2.6 which is slightly worse than one year prior.  Patient was previously given a prescription for Fosamax and instructed to continue her calcium and vitamin D supplementation.  Repeat in April 2020.   3.  Port removal: Patient was once again given a referral to  surgery. 4.  Anal fissure: Continue monitoring and treatment per surgery.  Patient expressed understanding and was in  agreement with this plan. She also understands that She can call clinic at any time with any questions, concerns, or complaints.   Breast cancer of lower-inner quadrant of right female breast   Staging form: Breast, AJCC 7th Edition     Pathologic stage from 02/24/2015: Stage IIIA (T1c, N2b, cM0) - Signed by Lloyd Huger, MD on 02/24/2015   Lloyd Huger, MD 02/24/18 9:10 AM

## 2018-02-21 ENCOUNTER — Ambulatory Visit: Payer: Medicare Other | Admitting: Radiation Oncology

## 2018-02-21 ENCOUNTER — Encounter: Payer: Self-pay | Admitting: Oncology

## 2018-02-21 ENCOUNTER — Inpatient Hospital Stay: Payer: Medicare Other | Attending: Oncology | Admitting: Oncology

## 2018-02-21 VITALS — BP 109/71 | HR 86 | Temp 97.7°F | Resp 18 | Wt 202.2 lb

## 2018-02-21 DIAGNOSIS — M810 Age-related osteoporosis without current pathological fracture: Secondary | ICD-10-CM | POA: Insufficient documentation

## 2018-02-21 DIAGNOSIS — K602 Anal fissure, unspecified: Secondary | ICD-10-CM | POA: Insufficient documentation

## 2018-02-21 DIAGNOSIS — Z9011 Acquired absence of right breast and nipple: Secondary | ICD-10-CM | POA: Diagnosis not present

## 2018-02-21 DIAGNOSIS — R51 Headache: Secondary | ICD-10-CM | POA: Diagnosis not present

## 2018-02-21 DIAGNOSIS — Z170 Estrogen receptor positive status [ER+]: Secondary | ICD-10-CM | POA: Diagnosis not present

## 2018-02-21 DIAGNOSIS — C50311 Malignant neoplasm of lower-inner quadrant of right female breast: Secondary | ICD-10-CM | POA: Diagnosis present

## 2018-02-21 DIAGNOSIS — I10 Essential (primary) hypertension: Secondary | ICD-10-CM | POA: Diagnosis not present

## 2018-02-21 DIAGNOSIS — Z79811 Long term (current) use of aromatase inhibitors: Secondary | ICD-10-CM | POA: Insufficient documentation

## 2018-02-21 DIAGNOSIS — Z923 Personal history of irradiation: Secondary | ICD-10-CM | POA: Insufficient documentation

## 2018-02-21 DIAGNOSIS — Z87891 Personal history of nicotine dependence: Secondary | ICD-10-CM | POA: Diagnosis not present

## 2018-02-21 NOTE — Progress Notes (Signed)
Pt in for 6 month follow up, reports has had a headache off and on for 2 weeks.  Pt also states has "fissure" and is using a doughnut to sit on.  Pt also verbalized she did not have port a cath removed yet because "I got sick and had to go to the hospital in February when it was scheduled to be removed and I haven't set back up:.

## 2018-03-04 ENCOUNTER — Encounter: Payer: Self-pay | Admitting: Surgery

## 2018-03-04 ENCOUNTER — Ambulatory Visit (INDEPENDENT_AMBULATORY_CARE_PROVIDER_SITE_OTHER): Payer: Medicare Other | Admitting: Surgery

## 2018-03-04 VITALS — BP 120/69 | HR 76 | Temp 97.7°F | Resp 14 | Ht 66.0 in | Wt 202.0 lb

## 2018-03-04 DIAGNOSIS — K602 Anal fissure, unspecified: Secondary | ICD-10-CM

## 2018-03-04 NOTE — Progress Notes (Signed)
Outpatient Surgical Follow Up  03/04/2018  Abigail Ortega is an 67 y.o. female.   Chief Complaint  Patient presents with  . Routine Post Op    HPI: Abigail Ortega is following up for an anal fissure.  She has been doing sitz baths and fiber seem to be helping quite a bit.  She has some occasional intermittent sharp mild pains but now it is manageable and tolerable.  No hematochezia.  No other issues.  She wishes to also have her port removed. No fevers no chills no weight loss  Past Medical History:  Diagnosis Date  . Arthritis   . Breast cancer [facility]) 2014   Right breast cancer - chemo, radiation and Mastectomy  . CHF (congestive heart failure) ([place]) 2013  . Clotting disorder ([place])    blood clots  . GERD (gastroesophageal reflux disease)   . GI bleed   . Heart attack [facility]) 2012   coronary stent x2 completed by Dr. Clayborn Bigness.  Marland Kitchen Heart attack ([place]) Apr 29, 2013  . Hemorrhoids   . Hypertension   . Hypothyroidism   . IBS (irritable bowel syndrome)   . Malignant neoplasm of lower-inner quadrant of female breast [facility]) January 20, 2013.   invasive mammary cancer, minimal 0.85 cm.  Histologic grade 1.  . Personal history of chemotherapy 2014   BREAST CA  . Personal history of radiation therapy 2014   BREAST CA  . Thyroid disease     Past Surgical History:  Procedure Laterality Date  . BOTOX INJECTION N/A 03/14/2017   Procedure: BOTOX INJECTION;  Surgeon: Jules Husbands, MD;  Location: [facility] ORS;  Service: General;  Laterality: N/A;  . BREAST BIOPSY Right 2014   positive  . BREAST SURGERY Right 02-21-2013   right mastectomy  . CARDIAC CATHETERIZATION    . COLONOSCOPY W/ BIOPSIES  09/15/2010   Colonoscopy completed by Verdie Shire, M.D. Tubular adenoma of the ascending colon and descending colon reported up to 0.4 cm in diameter. No atypia.  . COLONOSCOPY WITH PROPOFOL N/A 12/23/2016   Procedure: COLONOSCOPY WITH PROPOFOL;  Surgeon: Wilford Corner, MD;  Location: [facility] ENDOSCOPY;  Service:  Endoscopy;  Laterality: N/A;  . ESOPHAGOGASTRODUODENOSCOPY N/A 12/13/2016   Procedure: ESOPHAGOGASTRODUODENOSCOPY (EGD);  Surgeon: [name], MD;  Location: [facility] ENDOSCOPY;  Service: Endoscopy;  Laterality: N/A;  . MASTECTOMY Right 2014   BREAST CA  . PORTA CATH INSERTION    . UPPER GI ENDOSCOPY  09/15/2010     Completed by Verdie Shire, M.D. for nausea. Normal exam reported.  Marland Kitchen VASCULAR SURGERY      Family History  Problem Relation Age of Onset  . Breast cancer Mother 39  . Cancer Father        colon    Social History:  reports that she has quit smoking. She quit after 20.00 years of use. She has never used smokeless tobacco. She reports that she does not drink alcohol or use drugs.  Allergies: No Known Allergies  Medications reviewed.    ROS Full ROS performed and is otherwise negative other than what is stated in HPI   BP 120/69   Pulse 76   Temp 97.7 F (36.5 C) (Oral)   Resp 14   Ht 5\' 6"  (1.676 m)   Wt 202 lb (91.6 kg)   BMI 32.60 kg/m   Physical Exam  NAD, awake and alert Chest: Left  Port in place, no infection. No chest tenderness. No use acc muscles Abd: soft, nt, no masses Rectal:  very tiny midline fissure, improved from last visit no masses/ Ext: well perfused and warm    No results found for this or any previous visit (from the past 48 hour(s)). No results found.  Assessment/Plan:  1. Fissure in ano Doing well and responding to medical rx. Continue Fiber, sitz baths and nifedipine. We will remove port next week d/w the pt in detail about the procedure.    Greater than 50% of the 15 minutes  visit was spent in counseling/coordination of care   Caroleen Hamman, MD [place] Surgeon

## 2018-03-04 NOTE — Patient Instructions (Signed)
Patient to be scheduled for port removal on 03/13/2018.

## 2018-03-05 ENCOUNTER — Ambulatory Visit: Payer: Medicare Other | Admitting: Oncology

## 2018-03-06 ENCOUNTER — Ambulatory Visit: Payer: Medicare Other | Attending: Radiation Oncology | Admitting: Radiation Oncology

## 2018-03-07 ENCOUNTER — Ambulatory Visit: Payer: Medicare Other | Admitting: Oncology

## 2018-03-13 ENCOUNTER — Ambulatory Visit: Payer: Self-pay | Admitting: Surgery

## 2018-04-02 DIAGNOSIS — G4700 Insomnia, unspecified: Secondary | ICD-10-CM | POA: Insufficient documentation

## 2018-04-02 DIAGNOSIS — F064 Anxiety disorder due to known physiological condition: Secondary | ICD-10-CM | POA: Insufficient documentation

## 2018-04-02 HISTORY — DX: Anxiety disorder due to known physiological condition: F06.4

## 2018-04-02 HISTORY — DX: Insomnia, unspecified: G47.00

## 2018-04-03 ENCOUNTER — Encounter: Payer: Self-pay | Admitting: Surgery

## 2018-04-03 ENCOUNTER — Ambulatory Visit (INDEPENDENT_AMBULATORY_CARE_PROVIDER_SITE_OTHER): Payer: Medicare Other | Admitting: Surgery

## 2018-04-03 VITALS — BP 131/78 | HR 86 | Temp 97.2°F | Resp 18 | Ht 66.0 in | Wt 202.0 lb

## 2018-04-03 DIAGNOSIS — Z95828 Presence of other vascular implants and grafts: Secondary | ICD-10-CM

## 2018-04-03 NOTE — Progress Notes (Signed)
Procedure Note 1.Removal of port  Anesthesia: lidocaine 1% w epi  EBL: minimal  Complications: none  Informed consent was obtained patient was prepped and draped in the sterile fashion local anesthetic was injected And was created with a 15 blade knife and Metzenbaums were used to dissect through subcutaneous tissue until the pocket was visualized and the port was removed.  We removed the Prolene sutures and the port in the standard fashion.  We held pressure in the chest wall for a few minutes to obtain adequate hemostasis.  The wound was closed in a 2 layer fashion with 3-0 Vicryl and 4-0 Monocryl for the skin.  Dermabond was applied. no complications

## 2018-04-03 NOTE — Patient Instructions (Addendum)
You may restart your Plavix on 04/04/18.  Please do not shower until 04/05/18.  Please see your follow up appointment listed below.   Call our office with any questions or concerns that you may have.     Implanted Port Removal Implanted port removal is a procedure to remove the port and catheter (port-a-cath) that is implanted under your skin. The port is a small disc under your skin that can be punctured with a needle. It is connected to a vein in your chest or neck by a small flexible tube (catheter). The port-a-cath is used for treatment through an IV tube and for taking blood samples. Your health care provider will remove the port-a-cath if:  You no longer need it for treatment.  It is not working properly.  The area around it gets infected.  Tell a health care provider about:  Any allergies you have.  All medicines you are taking, including vitamins, herbs, eye drops, creams, and over-the-counter medicines.  Any problems you or family members have had with anesthetic medicines.  Any blood disorders you have.  Any surgeries you have had.  Any medical conditions you have.  Whether you are pregnant or may be pregnant. What are the risks? Generally, this is a safe procedure. However, problems may occur, including:  Infection.  Bleeding.  Allergic reactions to anesthetic medicines.  Damage to nerves or blood vessels.  What happens before the procedure?  You will have: ? A physical exam. ? Blood tests. ? Imaging tests, including a chest X-ray.  Follow instructions from your health care provider about eating or drinking restrictions.  Ask your health care provider about: ? Changing or stopping your regular medicines. This is especially important if you are taking diabetes medicines or blood thinners. ? Taking medicines such as aspirin and ibuprofen. These medicines can thin your blood. Do not take these medicines before your procedure if your surgeon instructs you  not to.  Ask your health care provider how your surgical site will be marked or identified.  You may be given antibiotic medicine to help prevent infection.  Plan to have someone take you home after the procedure.  If you will be going home right after the procedure, plan to have someone stay with you for 24 hours. What happens during the procedure?  To reduce your risk of infection: ? Your health care team will wash or sanitize their hands. ? Your skin will be washed with soap.  You may be given one or more of the following: ? A medicine to help you relax (sedative). ? A medicine to numb the area (local anesthetic).  A small cut (incision) will be made at the site of your port-a-cath.  The port-a-cath and the catheter that has been inside your vein will gently be removed.  The incision will be closed with stitches (sutures), adhesive strips, or skin glue.  A bandage (dressing) will be placed over the incision. The procedure may vary among health care providers and hospitals. What happens after the procedure?  Your blood pressure, heart rate, breathing rate, and blood oxygen level will be monitored often until the medicines you were given have worn off.  Do not drive for 24 hours if you received a sedative. This information is not intended to replace advice given to you by your health care provider. Make sure you discuss any questions you have with your health care provider. Document Released: 06/14/2015 Document Revised: 12/09/2015 Document Reviewed: 04/07/2015 Elsevier Interactive Patient Education  2018  [employer].

## 2018-04-17 ENCOUNTER — Encounter: Payer: Self-pay | Admitting: Surgery

## 2018-04-17 ENCOUNTER — Ambulatory Visit (INDEPENDENT_AMBULATORY_CARE_PROVIDER_SITE_OTHER): Payer: Medicare Other | Admitting: Surgery

## 2018-04-17 VITALS — BP 119/77 | HR 106 | Temp 97.5°F | Resp 18 | Ht 68.0 in | Wt 203.2 lb

## 2018-04-17 DIAGNOSIS — K602 Anal fissure, unspecified: Secondary | ICD-10-CM

## 2018-04-17 NOTE — Patient Instructions (Addendum)
Please pick up your prescription at the pharmacy and begin using today.   Please stop your Plavix 7 days prior to surgery.

## 2018-04-17 NOTE — H&P (View-Only) (Signed)
Outpatient Surgical Follow Up  04/17/2018  Abigail Ortega is an 67 y.o. female.   Chief Complaint  Patient presents with  . Routine Post Op    Port removal    HPI: She Is following for anal fissure.  She did have a chemical sphincterotomy last year with good results but they come back.  She is using sitz baths and nifedipine cream.  She does have intermittent rectal pain after having a bowel movement.  No fevers no chills no recent hematochezia.  I did remove the port and she is doing very well from that.  Past Medical History:  Diagnosis Date  . Arthritis   . Breast cancer [facility]) 2014   Right breast cancer - chemo, radiation and Mastectomy  . CHF (congestive heart failure) ([place]) 2013  . Clotting disorder ([place])    blood clots  . GERD (gastroesophageal reflux disease)   . GI bleed   . Heart attack [facility]) 2012   coronary stent x2 completed by Dr. Clayborn Bigness.  Marland Kitchen Heart attack ([place]) Apr 29, 2013  . Hemorrhoids   . Hypertension   . Hypothyroidism   . IBS (irritable bowel syndrome)   . Malignant neoplasm of lower-inner quadrant of female breast [facility]) January 20, 2013.   invasive mammary cancer, minimal 0.85 cm.  Histologic grade 1.  . Personal history of chemotherapy 2014   BREAST CA  . Personal history of radiation therapy 2014   BREAST CA  . Thyroid disease     Past Surgical History:  Procedure Laterality Date  . BOTOX INJECTION N/A 03/14/2017   Procedure: BOTOX INJECTION;  Surgeon: Jules Husbands, MD;  Location: [facility] ORS;  Service: General;  Laterality: N/A;  . BREAST BIOPSY Right 2014   positive  . BREAST SURGERY Right 02-21-2013   right mastectomy  . CARDIAC CATHETERIZATION    . COLONOSCOPY W/ BIOPSIES  09/15/2010   Colonoscopy completed by Verdie Shire, M.D. Tubular adenoma of the ascending colon and descending colon reported up to 0.4 cm in diameter. No atypia.  . COLONOSCOPY WITH PROPOFOL N/A 12/23/2016   Procedure: COLONOSCOPY WITH PROPOFOL;  Surgeon: Wilford Corner, MD;   Location: [facility] ENDOSCOPY;  Service: Endoscopy;  Laterality: N/A;  . ESOPHAGOGASTRODUODENOSCOPY N/A 12/13/2016   Procedure: ESOPHAGOGASTRODUODENOSCOPY (EGD);  Surgeon: [name], MD;  Location: [facility] ENDOSCOPY;  Service: Endoscopy;  Laterality: N/A;  . MASTECTOMY Right 2014   BREAST CA  . PORTA CATH INSERTION    . UPPER GI ENDOSCOPY  09/15/2010     Completed by Verdie Shire, M.D. for nausea. Normal exam reported.  Marland Kitchen VASCULAR SURGERY      Family History  Problem Relation Age of Onset  . Breast cancer Mother 3  . Cancer Father        colon    Social History:  reports that she has quit smoking. She quit after 20.00 years of use. She has never used smokeless tobacco. She reports that she does not drink alcohol or use drugs.  Allergies: No Known Allergies  Medications reviewed.    ROS Full ROS performed and is otherwise negative other than what is stated in HPI   BP 119/77   Pulse (!) 106   Temp (!) 97.5 F (36.4 C) (Temporal)   Resp 18   Ht 5\' 8"  (1.727 m)   Wt 203 lb 3.2 oz (92.2 kg)   BMI 30.90 kg/m   Physical Exam  Constitutional: She is oriented to person, place, and time. She appears well-developed and well-nourished.  No distress.  Cardiovascular: Normal rate and regular rhythm.  Pulmonary/Chest: Effort normal and breath sounds normal. No respiratory distress.  Port site healing well, no infection  Abdominal: Soft. She exhibits no distension and no mass. There is no tenderness. There is no guarding.  Genitourinary:  Genitourinary Comments: Rectal: Right posterolateral fissure, tender exam. No masses  Neurological: She is alert and oriented to person, place, and time. No cranial nerve deficit. She exhibits normal muscle tone. Coordination normal.  Skin: Skin is warm and dry. Capillary refill takes less than 2 seconds. She is not diaphoretic.  Psychiatric: She has a normal mood and affect. Her behavior is normal. Judgment and thought content normal.  Nursing note and  vitals reviewed.      Assessment/Plan:  1. Anal fissure ReCurrent fissure.  Patient with persistent symptoms.  Discussed with patient detail about chemical sphincterotomy with buttocks.  Risk benefit and possible complications including but not limited to: Incontinence, bleeding, infection and recurrence of symptoms.  She understands and wishes to proceed  Caroleen Hamman, MD [place] Surgeon

## 2018-04-17 NOTE — Progress Notes (Signed)
Outpatient Surgical Follow Up  04/17/2018  Abigail Ortega is an 67 y.o. female.   Chief Complaint  Patient presents with  . Routine Post Op    Port removal    HPI: She Is following for anal fissure.  She did have a chemical sphincterotomy last year with good results but they come back.  She is using sitz baths and nifedipine cream.  She does have intermittent rectal pain after having a bowel movement.  No fevers no chills no recent hematochezia.  I did remove the port and she is doing very well from that.  Past Medical History:  Diagnosis Date  . Arthritis   . Breast cancer [facility]) 2014   Right breast cancer - chemo, radiation and Mastectomy  . CHF (congestive heart failure) ([place]) 2013  . Clotting disorder ([place])    blood clots  . GERD (gastroesophageal reflux disease)   . GI bleed   . Heart attack [facility]) 2012   coronary stent x2 completed by Dr. Clayborn Bigness.  Marland Kitchen Heart attack ([place]) Apr 29, 2013  . Hemorrhoids   . Hypertension   . Hypothyroidism   . IBS (irritable bowel syndrome)   . Malignant neoplasm of lower-inner quadrant of female breast [facility]) January 20, 2013.   invasive mammary cancer, minimal 0.85 cm.  Histologic grade 1.  . Personal history of chemotherapy 2014   BREAST CA  . Personal history of radiation therapy 2014   BREAST CA  . Thyroid disease     Past Surgical History:  Procedure Laterality Date  . BOTOX INJECTION N/A 03/14/2017   Procedure: BOTOX INJECTION;  Surgeon: Jules Husbands, MD;  Location: [facility] ORS;  Service: General;  Laterality: N/A;  . BREAST BIOPSY Right 2014   positive  . BREAST SURGERY Right 02-21-2013   right mastectomy  . CARDIAC CATHETERIZATION    . COLONOSCOPY W/ BIOPSIES  09/15/2010   Colonoscopy completed by Verdie Shire, M.D. Tubular adenoma of the ascending colon and descending colon reported up to 0.4 cm in diameter. No atypia.  . COLONOSCOPY WITH PROPOFOL N/A 12/23/2016   Procedure: COLONOSCOPY WITH PROPOFOL;  Surgeon: Wilford Corner, MD;   Location[facility] ENDOSCOPY;  Service: Endoscopy;  Laterality: N/A;  . ESOPHAGOGASTRODUODENOSCOPY N/A 12/13/2016   Procedure: ESOPHAGOGASTRODUODENOSCOPY (EGD);  Surgeon: [name], MD;  Location: [facility] ENDOSCOPY;  Service: Endoscopy;  Laterality: N/A;  . MASTECTOMY Right 2014   BREAST CA  . PORTA CATH INSERTION    . UPPER GI ENDOSCOPY  09/15/2010     Completed by Verdie Shire, M.D. for nausea. Normal exam reported.  Marland Kitchen VASCULAR SURGERY      Family History  Problem Relation Age of Onset  . Breast cancer Mother 82  . Cancer Father        colon    Social History:  reports that she has quit smoking. She quit after 20.00 years of use. She has never used smokeless tobacco. She reports that she does not drink alcohol or use drugs.  Allergies: No Known Allergies  Medications reviewed.    ROS Full ROS performed and is otherwise negative other than what is stated in HPI   BP 119/77   Pulse (!) 106   Temp (!) 97.5 F (36.4 C) (Temporal)   Resp 18   Ht 5\' 8"  (1.727 m)   Wt 203 lb 3.2 oz (92.2 kg)   BMI 30.90 kg/m   Physical Exam  Constitutional: She is oriented to person, place, and time. She appears well-developed and well-nourished.  No distress.  Cardiovascular: Normal rate and regular rhythm.  Pulmonary/Chest: Effort normal and breath sounds normal. No respiratory distress.  Port site healing well, no infection  Abdominal: Soft. She exhibits no distension and no mass. There is no tenderness. There is no guarding.  Genitourinary:  Genitourinary Comments: Rectal: Right posterolateral fissure, tender exam. No masses  Neurological: She is alert and oriented to person, place, and time. No cranial nerve deficit. She exhibits normal muscle tone. Coordination normal.  Skin: Skin is warm and dry. Capillary refill takes less than 2 seconds. She is not diaphoretic.  Psychiatric: She has a normal mood and affect. Her behavior is normal. Judgment and thought content normal.  Nursing note and  vitals reviewed.      Assessment/Plan:  1. Anal fissure ReCurrent fissure.  Patient with persistent symptoms.  Discussed with patient detail about chemical sphincterotomy with buttocks.  Risk benefit and possible complications including but not limited to: Incontinence, bleeding, infection and recurrence of symptoms.  She understands and wishes to proceed  Caroleen Hamman, MD [place] [facility]geon

## 2018-04-18 ENCOUNTER — Telehealth: Payer: Self-pay | Admitting: *Deleted

## 2018-04-18 NOTE — Telephone Encounter (Signed)
Patient's surgery has been scheduled for 05-09-18 at [facility] with Dr. [name].   The patient is aware of pre-admission date and time.   She was instructed to call the office should she have further questions.

## 2018-05-02 ENCOUNTER — Encounter
Admission: RE | Admit: 2018-05-02 | Discharge: 2018-05-02 | Disposition: A | Discharge status: Home / Self Care | Payer: Medicare Other | Source: Ambulatory Visit | Attending: Surgery | Admitting: Surgery

## 2018-05-02 ENCOUNTER — Other Ambulatory Visit: Payer: Self-pay

## 2018-05-02 DIAGNOSIS — I10 Essential (primary) hypertension: Secondary | ICD-10-CM | POA: Insufficient documentation

## 2018-05-02 DIAGNOSIS — Z01818 Encounter for other preprocedural examination: Secondary | ICD-10-CM | POA: Insufficient documentation

## 2018-05-02 DIAGNOSIS — I252 Old myocardial infarction: Secondary | ICD-10-CM | POA: Insufficient documentation

## 2018-05-02 HISTORY — DX: Headache, unspecified: R51.9

## 2018-05-02 HISTORY — DX: Headache: R51

## 2018-05-02 HISTORY — DX: Chronic kidney disease, unspecified: N18.9

## 2018-05-02 HISTORY — DX: Major depressive disorder, single episode, unspecified: F32.9

## 2018-05-02 HISTORY — DX: Depression, unspecified: F32.A

## 2018-05-02 HISTORY — DX: Anxiety disorder, unspecified: F41.9

## 2018-05-02 HISTORY — DX: Presence of coronary angioplasty implant and graft: Z95.5

## 2018-05-02 LAB — BASIC METABOLIC PANEL
Anion gap: 8 (ref 5–15)
BUN: 17 mg/dL (ref 8–23)
CO2: 30 mmol/L (ref 22–32)
Calcium: 10 mg/dL (ref 8.9–10.3)
Chloride: 101 mmol/L (ref 98–111)
Creatinine, Ser: 1.43 mg/dL — ABNORMAL HIGH (ref 0.44–1.00)
GFR calc non Af Amer: 37 mL/min — ABNORMAL LOW (ref >60)
GFR, EST AFRICAN AMERICAN: 43 mL/min — AB (ref >60)
Glucose, Bld: 112 mg/dL — ABNORMAL HIGH (ref 70–99)
POTASSIUM: 4.3 mmol/L (ref 3.5–5.1)
SODIUM: 139 mmol/L (ref 135–145)

## 2018-05-02 LAB — CBC
HCT: 35.2 % — ABNORMAL LOW (ref 36.0–46.0)
HEMOGLOBIN: 11.2 g/dL — AB (ref 12.0–15.0)
MCH: 29.9 pg (ref 26.0–34.0)
MCHC: 31.8 g/dL (ref 30.0–36.0)
MCV: 93.9 fL (ref 80.0–100.0)
NRBC: 0 % (ref 0.0–0.2)
Platelets: 391 10*3/uL (ref 150–400)
RBC: 3.75 MIL/uL — ABNORMAL LOW (ref 3.87–5.11)
RDW: 12.3 % (ref 11.5–15.5)
WBC: 6.1 10*3/uL (ref 4.0–10.5)

## 2018-05-02 NOTE — Pre-Procedure Instructions (Signed)
Reviewed EKG with Dr Rosey Bath. No new orders.

## 2018-05-02 NOTE — Patient Instructions (Addendum)
Your procedure is scheduled on: Thurs 05/09/18 Report to [place]. To find out your arrival time please call [phone] between [place] on Wed 05/08/18.  Remember: Instructions that are not followed completely may result in serious medical risk, up to and including death, or upon the discretion of your surgeon and anesthesiologist your surgery may need to be rescheduled.     _X__ 1. Do not eat food after midnight the night before your procedure.                 No gum chewing or hard candies. You may drink clear liquids up to 2 hours                 before you are scheduled to arrive for your surgery- DO not drink clear                 liquids within 2 hours of the start of your surgery.                 Clear Liquids include:  water, apple juice without pulp, clear carbohydrate                 drink such as Clearfast or Gatorade, Black Coffee or Tea (Do not add                 anything to coffee or tea).  __X__2.  On the morning of surgery brush your teeth with toothpaste and water, you                 may rinse your mouth with mouthwash if you wish.  Do not swallow any              toothpaste of mouthwash.     _X__ 3.  No Alcohol for 24 hours before or after surgery.   _X__ 4.  Do Not Smoke or use e-cigarettes For 24 Hours Prior to Your Surgery.                 Do not use any chewable tobacco products for at least 6 hours prior to                 surgery.  ____  5.  Bring all medications with you on the day of surgery if instructed.   __X__  6.  Notify your doctor if there is any change in your medical condition      (cold, fever, infections).     Do not wear jewelry, make-up, hairpins, clips or nail polish. Do not wear lotions, powders, or perfumes.  Do not shave 48 hours prior to surgery. Men may shave face and neck. Do not bring valuables to the hospital.    [facility] is not responsible for any belongings or  valuables.  Contacts, dentures/partials or body piercings may not be worn into surgery. Bring a case for your contacts, glasses or hearing aids, a denture cup will be supplied. Leave your suitcase in the car. After surgery it may be brought to your room. For patients admitted to the hospital, discharge time is determined by your treatment team.   Patients discharged the day of surgery will not be allowed to drive home.   Please read over the following fact sheets that you were given:   MRSA Information  __X__ Take these medicines the morning of surgery with A SIP OF WATER:  1. atorvastatin (LIPITOR)  2. fexofenadine (ALLEGRA)  3. gabapentin (NEURONTIN)   4. hydrALAZINE (APRESOLINE)  5. isosorbide mononitrate (IMDUR)   6. letrozole (FEMARA)  7. levothyroxine (SYNTHROID, LEVOTHROID)  8. pantoprazole (PROTONIX)  9. May take alprazolam for anxiety if needed  ____ Fleet Enema (as directed)   ____ Use CHG Soap/SAGE wipes as directed  __X__ Use inhalers on the day of surgery  ____ Stop metformin/Janumet/Farxiga 2 days prior to surgery    ____ Take 1/2 of usual insulin dose the night before surgery. No insulin the morning          of surgery.   __X__ Stop Blood Thinners Coumadin/Plavix/Xarelto/Pleta/Pradaxa/Eliquis/Effient/Aspirin  on   Or contact your Surgeon, Cardiologist or Medical Doctor regarding  ability to stop your blood thinners         PLAVIX STOPPED ON 05/02/18  __X__ Stop Anti-inflammatories 7 days before surgery such as Advil, Ibuprofen, Motrin,  BC or Goodies Powder, Naprosyn, Naproxen, Aleve, Aspirin    __X__ Stop all herbal supplements, fish oil or vitamin E until after surgery.    ____ Bring C-Pap to the hospital.

## 2018-05-09 ENCOUNTER — Ambulatory Visit
Admission: RE | Admit: 2018-05-09 | Discharge: 2018-05-09 | Disposition: A | Discharge status: Home / Self Care | Payer: Medicare Other | Source: Ambulatory Visit | Attending: Surgery | Admitting: Surgery

## 2018-05-09 ENCOUNTER — Ambulatory Visit: Payer: Medicare Other | Admitting: Certified Registered Nurse Anesthetist

## 2018-05-09 ENCOUNTER — Encounter
Admission: RE | Disposition: A | Discharge status: Home / Self Care | Payer: Self-pay | Source: Ambulatory Visit | Attending: Surgery

## 2018-05-09 DIAGNOSIS — K589 Irritable bowel syndrome without diarrhea: Secondary | ICD-10-CM | POA: Diagnosis not present

## 2018-05-09 DIAGNOSIS — I509 Heart failure, unspecified: Secondary | ICD-10-CM | POA: Insufficient documentation

## 2018-05-09 DIAGNOSIS — Z87891 Personal history of nicotine dependence: Secondary | ICD-10-CM | POA: Diagnosis not present

## 2018-05-09 DIAGNOSIS — E669 Obesity, unspecified: Secondary | ICD-10-CM | POA: Diagnosis not present

## 2018-05-09 DIAGNOSIS — I2510 Atherosclerotic heart disease of native coronary artery without angina pectoris: Secondary | ICD-10-CM | POA: Insufficient documentation

## 2018-05-09 DIAGNOSIS — E039 Hypothyroidism, unspecified: Secondary | ICD-10-CM | POA: Insufficient documentation

## 2018-05-09 DIAGNOSIS — F419 Anxiety disorder, unspecified: Secondary | ICD-10-CM | POA: Insufficient documentation

## 2018-05-09 DIAGNOSIS — N189 Chronic kidney disease, unspecified: Secondary | ICD-10-CM | POA: Insufficient documentation

## 2018-05-09 DIAGNOSIS — M1990 Unspecified osteoarthritis, unspecified site: Secondary | ICD-10-CM | POA: Diagnosis not present

## 2018-05-09 DIAGNOSIS — F329 Major depressive disorder, single episode, unspecified: Secondary | ICD-10-CM | POA: Insufficient documentation

## 2018-05-09 DIAGNOSIS — K6289 Other specified diseases of anus and rectum: Secondary | ICD-10-CM | POA: Diagnosis not present

## 2018-05-09 DIAGNOSIS — Z955 Presence of coronary angioplasty implant and graft: Secondary | ICD-10-CM | POA: Diagnosis not present

## 2018-05-09 DIAGNOSIS — K219 Gastro-esophageal reflux disease without esophagitis: Secondary | ICD-10-CM | POA: Diagnosis not present

## 2018-05-09 DIAGNOSIS — K602 Anal fissure, unspecified: Secondary | ICD-10-CM | POA: Insufficient documentation

## 2018-05-09 DIAGNOSIS — I252 Old myocardial infarction: Secondary | ICD-10-CM | POA: Diagnosis not present

## 2018-05-09 DIAGNOSIS — I130 Hypertensive heart and chronic kidney disease with heart failure and stage 1 through stage 4 chronic kidney disease, or unspecified chronic kidney disease: Secondary | ICD-10-CM | POA: Diagnosis not present

## 2018-05-09 DIAGNOSIS — Z6830 Body mass index (BMI) 30.0-30.9, adult: Secondary | ICD-10-CM | POA: Insufficient documentation

## 2018-05-09 HISTORY — PX: BOTOX INJECTION: SHX5754

## 2018-05-09 HISTORY — PX: SPHINCTEROTOMY: SHX5279

## 2018-05-09 SURGERY — BOTOX INJECTION
Anesthesia: General

## 2018-05-09 MED ORDER — BUPIVACAINE LIPOSOME 1.3 % IJ SUSP
INTRAMUSCULAR | Status: AC
  Filled 2018-05-09: qty 20

## 2018-05-09 MED ORDER — FENTANYL CITRATE (PF) 100 MCG/2ML IJ SOLN
INTRAMUSCULAR | Status: AC
  Filled 2018-05-09: qty 2

## 2018-05-09 MED ORDER — SODIUM CHLORIDE 0.9 % IJ SOLN
INTRAMUSCULAR | Status: AC
  Filled 2018-05-09: qty 10

## 2018-05-09 MED ORDER — LIDOCAINE HCL (CARDIAC) PF 100 MG/5ML IV SOSY
PREFILLED_SYRINGE | INTRAVENOUS | Status: DC | PRN
  Administered 2018-05-09: 40 mg via INTRATRACHEAL
  Administered 2018-05-09: 60 mg via INTRATRACHEAL

## 2018-05-09 MED ORDER — BUPIVACAINE LIPOSOME 1.3 % IJ SUSP
INTRAMUSCULAR | Status: DC | PRN
  Administered 2018-05-09: 20 mL

## 2018-05-09 MED ORDER — PROPOFOL 10 MG/ML IV BOLUS
INTRAVENOUS | Status: DC | PRN
  Administered 2018-05-09: 20 mg via INTRAVENOUS
  Administered 2018-05-09: 30 mg via INTRAVENOUS

## 2018-05-09 MED ORDER — PROPOFOL 500 MG/50ML IV EMUL
INTRAVENOUS | Status: DC | PRN
  Administered 2018-05-09: 100 ug/kg/min via INTRAVENOUS

## 2018-05-09 MED ORDER — BUPIVACAINE HCL (PF) 0.25 % IJ SOLN
INTRAMUSCULAR | Status: DC | PRN
  Administered 2018-05-09: 30 mL

## 2018-05-09 MED ORDER — CHLORHEXIDINE GLUCONATE CLOTH 2 % EX PADS: 6.0000 | MEDICATED_PAD | Freq: Once | CUTANEOUS | Status: DC

## 2018-05-09 MED ORDER — OXYCODONE HCL 5 MG PO TABS: 5.0000 mg | ORAL_TABLET | Freq: Once | ORAL | Status: DC | PRN

## 2018-05-09 MED ORDER — MIDAZOLAM HCL 2 MG/2ML IJ SOLN
INTRAMUSCULAR | Status: AC
  Filled 2018-05-09: qty 2

## 2018-05-09 MED ORDER — MIDAZOLAM HCL 2 MG/2ML IJ SOLN
INTRAMUSCULAR | Status: DC | PRN
  Administered 2018-05-09: 2 mg via INTRAVENOUS

## 2018-05-09 MED ORDER — BUPIVACAINE HCL (PF) 0.25 % IJ SOLN
INTRAMUSCULAR | Status: AC
  Filled 2018-05-09: qty 30

## 2018-05-09 MED ORDER — PROMETHAZINE HCL 25 MG/ML IJ SOLN: 6.2500 mg | INTRAMUSCULAR | Status: DC | PRN

## 2018-05-09 MED ORDER — MEPERIDINE HCL 50 MG/ML IJ SOLN: 6.2500 mg | INTRAMUSCULAR | Status: DC | PRN

## 2018-05-09 MED ORDER — ONABOTULINUMTOXINA 100 UNITS IJ SOLR
50.0000 [IU] | Freq: Once | INTRAMUSCULAR | Status: DC
  Filled 2018-05-09: qty 100

## 2018-05-09 MED ORDER — LIDOCAINE HCL (PF) 2 % IJ SOLN
INTRAMUSCULAR | Status: AC
  Filled 2018-05-09: qty 10

## 2018-05-09 MED ORDER — FENTANYL CITRATE (PF) 100 MCG/2ML IJ SOLN
INTRAMUSCULAR | Status: DC | PRN
  Administered 2018-05-09 (×2): 25 ug via INTRAVENOUS

## 2018-05-09 MED ORDER — ONABOTULINUMTOXINA 100 UNITS IJ SOLR
INTRAMUSCULAR | Status: DC | PRN
  Administered 2018-05-09: 50 [IU] via INTRAMUSCULAR

## 2018-05-09 MED ORDER — OXYCODONE HCL 5 MG/5ML PO SOLN: 5.0000 mg | Freq: Once | ORAL | Status: DC | PRN

## 2018-05-09 MED ORDER — LACTATED RINGERS IV SOLN
INTRAVENOUS | Status: DC
  Administered 2018-05-09: 13:00:00 via INTRAVENOUS

## 2018-05-09 MED ORDER — FENTANYL CITRATE (PF) 100 MCG/2ML IJ SOLN: 25.0000 ug | INTRAMUSCULAR | Status: DC | PRN

## 2018-05-09 SURGICAL SUPPLY — 31 items
BRIEF STRETCH MATERNITY 2XLG (MISCELLANEOUS) ×3 IMPLANT
CANISTER SUCT 1200ML W/VALVE (MISCELLANEOUS) ×3 IMPLANT
CANISTER SUCT 3000ML PPV (MISCELLANEOUS) ×3 IMPLANT
CNTNR SPEC 2.5X3XGRAD LEK (MISCELLANEOUS) ×4
CONT SPEC 4OZ STER OR WHT (MISCELLANEOUS) ×8
CONTAINER SPEC 2.5X3XGRAD LEK (MISCELLANEOUS) ×4 IMPLANT
COVER LIGHT HANDLE STERIS (MISCELLANEOUS) ×6 IMPLANT
COVER MAYO STAND STRL (DRAPES) ×3 IMPLANT
COVER WAND RF STERILE (DRAPES) ×3 IMPLANT
DRAPE LEGGINS SURG 28X43 STRL (DRAPES) ×3 IMPLANT
DRAPE SHEET LG 3/4 BI-LAMINATE (DRAPES) ×3 IMPLANT
DRAPE UNDER BUTTOCK W/FLU (DRAPES) ×3 IMPLANT
ELECT REM PT RETURN 9FT ADLT (ELECTROSURGICAL) ×3
ELECTRODE REM PT RTRN 9FT ADLT (ELECTROSURGICAL) ×1 IMPLANT
GLOVE BIO SURGEON STRL SZ7 (GLOVE) ×9 IMPLANT
GOWN STRL REUS W/ TWL LRG LVL3 (GOWN DISPOSABLE) ×2 IMPLANT
GOWN STRL REUS W/TWL LRG LVL3 (GOWN DISPOSABLE) ×4
LABEL OR SOLS (LABEL) ×3 IMPLANT
MARKER SKIN DUAL TIP RULER LAB (MISCELLANEOUS) ×3 IMPLANT
NEEDLE HYPO 22GX1.5 SAFETY (NEEDLE) ×6 IMPLANT
NS IRRIG 1000ML POUR BTL (IV SOLUTION) IMPLANT
PACK BASIN MINOR ARMC (MISCELLANEOUS) IMPLANT
PAD ABD DERMACEA PRESS 5X9 (GAUZE/BANDAGES/DRESSINGS) ×3 IMPLANT
SOL PREP PVP 2OZ (MISCELLANEOUS) ×3
SOLUTION PREP PVP 2OZ (MISCELLANEOUS) ×1 IMPLANT
SPONGE LAP 18X18 RF (DISPOSABLE) ×3 IMPLANT
SURGILUBE 2OZ TUBE FLIPTOP (MISCELLANEOUS) ×3 IMPLANT
SYR 10ML LL (SYRINGE) ×3 IMPLANT
SYR 20CC LL (SYRINGE) ×3 IMPLANT
SYR 5ML LL (SYRINGE) ×3 IMPLANT
TOWEL OR 17X26 4PK STRL BLUE (TOWEL DISPOSABLE) ×3 IMPLANT

## 2018-05-09 NOTE — Progress Notes (Signed)
   05/09/18 1400  Clinical Encounter Type  Visited With Patient  Visit Type Initial;Spiritual support  Recommendations Follow-up as requested.  Spiritual Encounters  Spiritual Needs Emotional   Chaplain provided energetic prayer and emotional support for patient in PACU.

## 2018-05-09 NOTE — Anesthesia Postprocedure Evaluation (Signed)
Anesthesia Post Note  Patient: Abigail Ortega  Procedure(s) Performed: BOTOX INJECTION (N/A ) EXAM UNDER ANESTHESIA (N/A ) SPHINCTEROTOMY (N/A )  Patient location during evaluation: PACU Anesthesia Type: General Level of consciousness: awake and alert Pain management: pain level controlled Vital Signs Assessment: post-procedure vital signs reviewed and stable Respiratory status: spontaneous breathing, nonlabored ventilation, respiratory function stable and patient connected to nasal cannula oxygen Cardiovascular status: blood pressure returned to baseline and stable Postop Assessment: no apparent nausea or vomiting Anesthetic complications: no     Last Vitals:  Vitals:   05/09/18 1447 05/09/18 1502  BP: 131/68 117/82  Pulse: 76 74  Resp: 16 14  Temp:  (!) 36.1 C  SpO2: 100% 99%    Last Pain:  Vitals:   05/09/18 1502  TempSrc: Tympanic  PainSc: 0-No pain                 Jamesetta Greenhalgh S

## 2018-05-09 NOTE — Interval H&P Note (Signed)
History and Physical Interval Note:  05/09/2018 12:08 PM  Abigail Ortega  has presented today for surgery, with the diagnosis of ANAL FISSURE  The various methods of treatment have been discussed with the patient and family. After consideration of risks, benefits and other options for treatment, the patient has consented to  Procedure(s): BOTOX INJECTION (N/A) EXAM UNDER ANESTHESIA (N/A) SPHINCTEROTOMY (N/A) as a surgical intervention .  The patient's history has been reviewed, patient examined, no change in status, stable for surgery.  I have reviewed the patient's chart and labs.  Questions were answered to the patient's satisfaction.     [place]

## 2018-05-09 NOTE — Op Note (Signed)
  05/09/2018  2:08 PM  PATIENT:  Abigail Ortega  67 y.o. female  PRE-OPERATIVE DIAGNOSIS:  Anal Fissure  POST-OPERATIVE DIAGNOSIS:  Same  PROCEDURE:  Chemical sphincterectomy using 50 IU Botox SURGEON:  Surgeon(s) and Role:    * Pabon, Diego F, MD - Primary   ANESTHESIA: General  EBL: minimal  DICTATION:  Patient was playing about the procedure in detail, risks benefits possible complications and a consent was obtained. The patient taken to the operating room and placed in the lithotomy position.   EUA revealed small post midline fissure w increase in sphincter tone. No other lesion. Botox injected into the sphincter after identifying the muscle with bidigital palpation.  Marcaine quarter percent with epinephrine and liposomal marcaine was injected around the wound site. Needle and laparotomy counts were correct and there were no immediate complications  Jules Husbands, MD

## 2018-05-09 NOTE — Anesthesia Preprocedure Evaluation (Signed)
Anesthesia Evaluation  Patient identified by MRN, date of birth, ID band Patient awake    Reviewed: Allergy & Precautions, NPO status , Patient's Chart, lab work & pertinent test results  History of Anesthesia Complications Negative for: history of anesthetic complications  Airway Mallampati: III  TM Distance: >3 FB Neck ROM: Full    Dental  (+) Edentulous Upper, Edentulous Lower   Pulmonary neg sleep apnea, neg COPD, former smoker,    breath sounds clear to auscultation- rhonchi (-) wheezing      Cardiovascular hypertension, Pt. on medications + CAD, + Past MI, + Cardiac Stents and +CHF   Rhythm:Regular Rate:Normal - Systolic murmurs and - Diastolic murmurs Echo 4/76/54: MILD LV SYSTOLIC DYSFUNCTION WITH MILD LVH NORMAL RIGHT VENTRICULAR SYSTOLIC FUNCTION MILD VALVULAR REGURGITATION (mild MR, trivial AI) NO VALVULAR STENOSIS EF 40%   Neuro/Psych  Headaches, PSYCHIATRIC DISORDERS Anxiety Depression    GI/Hepatic Neg liver ROS, GERD  ,  Endo/Other  neg diabetesHypothyroidism   Renal/GU Renal InsufficiencyRenal disease     Musculoskeletal  (+) Arthritis ,   Abdominal (+) + obese,   Peds  Hematology negative hematology ROS (+)   Anesthesia Other Findings Past Medical History: No date: Anxiety No date: Arthritis 2014: Breast cancer ([place])     Comment:  Right breast cancer - chemo, radiation and Mastectomy 2013: CHF (congestive heart failure) ([facility]) No date: Chronic kidney disease No date: Clotting disorder ([place])     Comment:  blood clots No date: Depression No date: GERD (gastroesophageal reflux disease) No date: GI bleed No date: H/O heart artery stent No date: Headache 2012: Heart attack [facility])     Comment:  coronary stent x2 completed by Dr. Clayborn Bigness. Apr 29, 2013: Heart attack ([place]) No date: Hemorrhoids No date: Hypertension No date: Hypothyroidism No date: IBS (irritable bowel syndrome) January 20, 2013.: Malignant neoplasm of lower-inner quadrant of female  breast ([place])     Comment:  invasive mammary cancer, minimal 0.85 cm.  Histologic               grade 1. 2014: Personal history of chemotherapy     Comment:  BREAST CA 2014: Personal history of radiation therapy     Comment:  BREAST CA No date: Thyroid disease   Reproductive/Obstetrics                             Anesthesia Physical Anesthesia Plan  ASA: III  Anesthesia Plan: General   Post-op Pain Management:    Induction: Intravenous  PONV Risk Score and Plan: 2 and Propofol infusion  Airway Management Planned: Natural Airway  Additional Equipment:   Intra-op Plan:   Post-operative Plan:   Informed Consent: I have reviewed the patients History and Physical, chart, labs and discussed the procedure including the risks, benefits and alternatives for the proposed anesthesia with the patient or authorized representative who has indicated his/her understanding and acceptance.   Dental advisory given  Plan Discussed with: CRNA and Anesthesiologist  Anesthesia Plan Comments:         Anesthesia Quick Evaluation

## 2018-05-09 NOTE — Anesthesia Post-op Follow-up Note (Signed)
Anesthesia QCDR form completed.        

## 2018-05-09 NOTE — Transfer of Care (Signed)
Immediate Anesthesia Transfer of Care Note  Patient: Abigail Ortega  Procedure(s) Performed: BOTOX INJECTION (N/A ) EXAM UNDER ANESTHESIA (N/A ) SPHINCTEROTOMY (N/A )  Patient Location: PACU  Anesthesia Type:General  Level of Consciousness: awake  Airway & Oxygen Therapy: Patient Spontanous Breathing and Patient connected to face mask oxygen  Post-op Assessment: Report given to RN and Post -op Vital signs reviewed and stable  Post vital signs: Reviewed and stable  Last Vitals:  Vitals Value Taken Time  BP 123/67 05/09/2018  2:17 PM  Temp 37.1 C 05/09/2018  2:17 PM  Pulse 78 05/09/2018  2:19 PM  Resp 15 05/09/2018  2:19 PM  SpO2 100 % 05/09/2018  2:19 PM  Vitals shown include unvalidated device data.  Last Pain:  Vitals:   05/09/18 1417  TempSrc:   PainSc: Asleep         Complications: No apparent anesthesia complications

## 2018-05-09 NOTE — Discharge Instructions (Signed)

## 2018-05-10 ENCOUNTER — Encounter: Payer: Self-pay | Admitting: Surgery

## 2018-05-22 ENCOUNTER — Other Ambulatory Visit: Payer: Self-pay

## 2018-05-22 ENCOUNTER — Encounter: Payer: Self-pay | Admitting: Surgery

## 2018-05-22 ENCOUNTER — Ambulatory Visit (INDEPENDENT_AMBULATORY_CARE_PROVIDER_SITE_OTHER): Payer: Medicare Other | Admitting: Surgery

## 2018-05-22 VITALS — BP 140/84 | HR 97 | Temp 97.2°F | Resp 16 | Ht 68.0 in | Wt 203.2 lb

## 2018-05-22 DIAGNOSIS — K602 Anal fissure, unspecified: Secondary | ICD-10-CM

## 2018-05-22 NOTE — Patient Instructions (Addendum)
Patient is to return to the office in 3 weeks with Dr.Pabon. Call the office with any questions or concerns. Please call the pharmacy before picking up your medication. [facility]  [place]  [id number]  [phone]

## 2018-05-24 ENCOUNTER — Encounter: Payer: Self-pay | Admitting: Surgery

## 2018-05-24 NOTE — Progress Notes (Signed)
S/p botox for anal fissure She feels a bit better Some burning  PE NAD Abd: soft,nt Rectal w slowly improving fissure, no masses No perineal sepsis   A/P Doing well Encourage to use nifedipine cream as well

## 2018-06-12 ENCOUNTER — Ambulatory Visit (INDEPENDENT_AMBULATORY_CARE_PROVIDER_SITE_OTHER): Payer: Medicare Other | Admitting: Surgery

## 2018-06-12 ENCOUNTER — Encounter: Payer: Self-pay | Admitting: Surgery

## 2018-06-12 VITALS — BP 119/80 | HR 91 | Temp 93.2°F | Resp 20 | Ht 68.0 in | Wt 204.6 lb

## 2018-06-12 DIAGNOSIS — K602 Anal fissure, unspecified: Secondary | ICD-10-CM | POA: Diagnosis not present

## 2018-06-12 NOTE — Patient Instructions (Signed)
Patient is to return to the office in 2 weeks continue to use the anal cream, sitz baths and use the fiber everyday.   Call the office with any questions or concerns.

## 2018-06-17 ENCOUNTER — Encounter: Payer: Self-pay | Admitting: Surgery

## 2018-06-17 NOTE — Progress Notes (Signed)
S/p chemical sphincterotomy Doing well overall but some burning  PE NAD  ABd: soft, nt Rectal: no perianal sepsis . Small post midline fissure  A/P DOing well Continue sitz baths, fiber May slow down on stool softener as it is causing some diarrhea RTC 2-3 weeks

## 2018-06-19 ENCOUNTER — Other Ambulatory Visit: Payer: Self-pay | Admitting: Oncology

## 2018-07-01 ENCOUNTER — Ambulatory Visit: Payer: Medicare Other | Admitting: Surgery

## 2018-07-03 ENCOUNTER — Other Ambulatory Visit: Payer: Self-pay

## 2018-07-03 ENCOUNTER — Encounter: Payer: Self-pay | Admitting: Surgery

## 2018-07-03 ENCOUNTER — Ambulatory Visit (INDEPENDENT_AMBULATORY_CARE_PROVIDER_SITE_OTHER): Payer: Medicare Other | Admitting: Surgery

## 2018-07-03 VITALS — BP 155/85 | HR 101 | Temp 98.1°F | Resp 13 | Ht 67.0 in | Wt 203.0 lb

## 2018-07-03 DIAGNOSIS — K602 Anal fissure, unspecified: Secondary | ICD-10-CM

## 2018-07-03 NOTE — Patient Instructions (Signed)
Return in one month. The patient is aware to call back for any questions or concerns.  

## 2018-07-03 NOTE — Progress Notes (Signed)
S/p chemical sphincterotomy 10/24 Doing well with significant improvement HE does have some hesitancy when having BM Taking Po, no fevers, normal bms  PE NAD , alert ABd: soft, nt, no peritonitis Rectal: no perianal sepsis . Significant improvement of fissure now is almost completely healed. Pinpoint area of redness. Ext: well perfused and no edema. Compression stocking in place Neuro: GCS 15, no motor or sens deficits  A/P DOing well Continue sitz baths, fiber and Nifedipine RTC 4 weeks

## 2018-07-31 ENCOUNTER — Encounter: Payer: Self-pay | Admitting: Surgery

## 2018-07-31 ENCOUNTER — Other Ambulatory Visit: Payer: Self-pay

## 2018-07-31 ENCOUNTER — Ambulatory Visit (INDEPENDENT_AMBULATORY_CARE_PROVIDER_SITE_OTHER): Payer: Medicare Other | Admitting: Surgery

## 2018-07-31 VITALS — BP 137/85 | HR 94 | Temp 96.8°F | Ht 67.0 in | Wt 201.0 lb

## 2018-07-31 DIAGNOSIS — K602 Anal fissure, unspecified: Secondary | ICD-10-CM | POA: Diagnosis not present

## 2018-07-31 MED ORDER — HYDROCORTISONE 2.5 % RE CREA: 1.0000 "application " | TOPICAL_CREAM | Freq: Two times a day (BID) | RECTAL | 1 refills | Status: DC

## 2018-07-31 NOTE — Patient Instructions (Addendum)
Patient is to return to the office in 1 month. Pick up your medication from your local [facility] on file. Continue to take sitz baths.   Call the office with any questions or concerns.   Hydrocortisone rectal cream What is this medicine? HYDROCORTISONE (hye droe KOR ti sone) is a corticosteroid. It is used to decrease swelling, itching and pain that is caused by minor skin irritations or hemorrhoids. This medicine may be used for other purposes; ask your health care provider or pharmacist if you have questions. COMMON BRAND NAME(S): Anusol HC, Procto-Kit, Proctocort, Proctocream-HC, Proctosol-HC, Proctozone-HC What should I tell my health care provider before I take this medicine? They need to know if you have any of these conditions: -an unusual or allergic reaction to hydrocortisone, corticosteroids, other medicines, foods, dyes, or preservatives -pregnant or trying to get pregnant -breast-feeding How should I use this medicine? This medicine is for rectal use only. Do not take by mouth. Do not apply to your eye. Follow directions on the prescription label. Wash your hands before and after use. Apply a thin film to the affected area. Do not use on healthy skin or over large areas of skin. Do not cover with a bandage or dressing unless your doctor or health care professional tells you to. If you are to cover the area, follow the instructions carefully. Covering the area can increase the amount that passes through the skin and increases the risk of side effects. Do not use your medicine more often than directed. It is important not to use more medicine than prescribed. Talk to your pediatrician regarding the use of this medicine in children. Special care may be needed. If applying this medicine to the diaper area of a child, do not cover with tight-fitting diapers or plastic pants. Overdosage: If you think you have taken too much of this medicine contact a poison control center or emergency room  at once. NOTE: This medicine is only for you. Do not share this medicine with others. What if I miss a dose? If you miss a dose, use it as soon as you can. If it is almost time for your next dose, use only that dose. Do not use double or extra doses. What may interact with this medicine? Interactions are not expected. Do not use any other skin products on the affected area without telling your doctor or health care professional. This list may not describe all possible interactions. Give your health care provider a list of all the medicines, herbs, non-prescription drugs, or dietary supplements you use. Also tell them if you smoke, drink alcohol, or use illegal drugs. Some items may interact with your medicine. What should I watch for while using this medicine? Tell your doctor or health care professional if your symptoms do not start to get better after a few days. If you get any type of infection while using this medicine, you may need to stop using this medicine until your infections clears up. Ask your doctor or health care professional for advice. What side effects may I notice from receiving this medicine? Side effects that you should report to your doctor or health care professional as soon as possible: -burning or itching of the skin -dark red spots on the skin -infection -lack of healing of skin condition -painful, red, pus-filled blisters in hair follicles -thinning of the skin Side effects that usually do not require medical attention (report to your doctor or health care professional if they continue or are bothersome): -dry  skin, irritation -unusual increased growth of hair on the face or body This list may not describe all possible side effects. Call your doctor for medical advice about side effects. You may report side effects to FDA at 1-800-FDA-1088. Where should I keep my medicine? Keep out of the reach of children. Store at room temperature between 20 and 25 degrees C (68  and 77 degrees F). Protect from heat and freezing. Throw away any unused medicine after the expiration date. NOTE: This sheet is a summary. It may not cover all possible information. If you have questions about this medicine, talk to your doctor, pharmacist, or health care provider.  2019 Elsevier/Gold Standard (2007-11-18 14:28:03)   Call the office with any questions or concerns.

## 2018-08-05 ENCOUNTER — Encounter: Payer: Self-pay | Admitting: Surgery

## 2018-08-05 NOTE — Progress Notes (Signed)
Surgical Consultation  08/05/2018  Abigail Ortega is an 68 y.o. female.   Chief Complaint  Patient presents with  . Follow-up     one month f/u anal fissurectomy done 05-09-18     HPI: Patient is very well-known to me with a history of anal fissure and has had 2 Botox injections in the past.  She continues to have some anorectal discomfort and the most bothersome symptom currently is some rectal tenesmus.  Eyes any fevers any chills.  No hematochezia or melena  Past Medical History:  Diagnosis Date  . Anxiety   . Arthritis   . Breast cancer [facility]) 2014   Right breast cancer - chemo, radiation and Mastectomy  . CHF (congestive heart failure) ([place]) 2013  . Chronic kidney disease   . Clotting disorder ([place])    blood clots  . Depression   . GERD (gastroesophageal reflux disease)   . GI bleed   . H/O heart artery stent   . Headache   . Heart attack [facility]) 2012   coronary stent x2 completed by Dr. Clayborn Bigness.  Marland Kitchen Heart attack ([place]) Apr 29, 2013  . Hemorrhoids   . Hypertension   . Hypothyroidism   . IBS (irritable bowel syndrome)   . Malignant neoplasm of lower-inner quadrant of female breast [facility]) January 20, 2013.   invasive mammary cancer, minimal 0.85 cm.  Histologic grade 1.  . Personal history of chemotherapy 2014   BREAST CA  . Personal history of radiation therapy 2014   BREAST CA  . Thyroid disease     Past Surgical History:  Procedure Laterality Date  . BOTOX INJECTION N/A 03/14/2017   Procedure: BOTOX INJECTION;  Surgeon: Jules Husbands, MD;  Location: [facility] ORS;  Service: General;  Laterality: N/A;  . BOTOX INJECTION N/A 05/09/2018   Procedure: BOTOX INJECTION;  Surgeon: Jules Husbands, MD;  Location: [facility] ORS;  Service: General;  Laterality: N/A;  . BREAST BIOPSY Right 2014   positive  . BREAST SURGERY Right 02-21-2013   right mastectomy  . CARDIAC CATHETERIZATION    . COLONOSCOPY W/ BIOPSIES  09/15/2010   Colonoscopy completed by Verdie Shire, M.D. Tubular adenoma  of the ascending colon and descending colon reported up to 0.4 cm in diameter. No atypia.  . COLONOSCOPY WITH PROPOFOL N/A 12/23/2016   Procedure: COLONOSCOPY WITH PROPOFOL;  Surgeon: Wilford Corner, MD;  Location: [facility] ENDOSCOPY;  Service: Endoscopy;  Laterality: N/A;  . ESOPHAGOGASTRODUODENOSCOPY N/A 12/13/2016   Procedure: ESOPHAGOGASTRODUODENOSCOPY (EGD);  Surgeon: [name], MD;  Location: [facility] ENDOSCOPY;  Service: Endoscopy;  Laterality: N/A;  . MASTECTOMY Right 2014   BREAST CA  . PORTA CATH INSERTION    . SPHINCTEROTOMY N/A 05/09/2018   Procedure: SPHINCTEROTOMY;  Surgeon: Jules Husbands, MD;  Location: [facility] ORS;  Service: General;  Laterality: N/A;  . UPPER GI ENDOSCOPY  09/15/2010     Completed by Verdie Shire, M.D. for nausea. Normal exam reported.  Marland Kitchen VASCULAR SURGERY      Family History  Problem Relation Age of Onset  . Breast cancer Mother 72  . Cancer Father        colon    Social History:  reports that she has quit smoking. She quit after 20.00 years of use. She has never used smokeless tobacco. She reports that she does not drink alcohol or use drugs.  Allergies: No Known Allergies  Medications reviewed.  ROS Full ROS performed and is otherwise negative other than what is stated in  the HPI    BP 137/85   Pulse 94   Temp (!) 96.8 F (36 C) (Temporal)   Ht 5\' 7"  (1.702 m)   Wt 201 lb (91.2 kg)   SpO2 97%   BMI 31.48 kg/m   Physical Exam Vitals signs and nursing note reviewed.  Constitutional:      General: She is not in acute distress.    Appearance: Normal appearance.  Pulmonary:     Effort: Pulmonary effort is normal.  Chest:     Chest wall: No tenderness.  Abdominal:     General: Abdomen is flat. There is no distension.     Palpations: Abdomen is soft. There is no mass.     Tenderness: There is no abdominal tenderness. There is no guarding.  Genitourinary:    Comments: There is no evidence of any blood on my rectal exam no masses.  There is very  minimal inflammatory response where the previous fissure was but it has improved significantly.  There is no evidence of any infection abscess Neurological:     Mental Status: She is alert.    Assessment/Plan: Anorectal issue.  Fissure has significantly improve.  We will continue current regimen of sitz baths nifedipine cream.  Given the discomfort and may prescribe a short course of topical steroids to help with the discomfort.  And also discussed with her that she was better but if the fissure would get worse she may need a lateral internal sphincterotomy.  Currently she wishes to try to avoid any surgery if possible  Caroleen Hamman, MD [facility]

## 2018-08-23 ENCOUNTER — Ambulatory Visit: Payer: Self-pay | Admitting: Cardiovascular Disease

## 2018-08-23 DIAGNOSIS — I249 Acute ischemic heart disease, unspecified: Secondary | ICD-10-CM | POA: Insufficient documentation

## 2018-08-23 NOTE — Progress Notes (Signed)
[place]  [phone] Fax:([phone]) [phone]  ID: Abigail Ortega OB: 1951/05/15  MR#: [id number]  DVV#:[id number]  Patient Care Team: Perrin Maltese, MD as PCP - General (Internal Medicine) Bary Castilla, Forest Gleason, MD (General Surgery) Sharene Butters, MD (General Surgery)  CHIEF COMPLAINT: Pathologic stage IIIa triple positive invasive carcinoma of the lower inner quadrant of the right breast.   INTERVAL HISTORY: Patient returns to clinic today for routine 26-month evaluation.  She continues to tolerate letrozole well without significant side effects.  She currently feels well and is at her baseline.  She has no neurologic complaints. She denies any recent fevers or illnesses. She has no chest pain or shortness of breath. She denies any nausea, vomiting, constipation, or diarrhea. She has no urinary complaints.  Patient offers no specific complaints today.  REVIEW OF SYSTEMS:   Review of Systems  Constitutional: Negative.  Negative for chills, fever, malaise/fatigue and weight loss.  HENT: Negative.  Negative for tinnitus.   Eyes: Negative.   Respiratory: Negative.  Negative for cough and sputum production.   Cardiovascular: Negative.  Negative for chest pain and leg swelling.  Gastrointestinal: Negative.  Negative for abdominal pain, constipation, diarrhea, nausea and vomiting.  Genitourinary: Negative.  Negative for dysuria.  Musculoskeletal: Negative.  Negative for myalgias.  Skin: Negative.  Negative for rash.  Neurological: Negative.  Negative for sensory change, weakness and headaches.  Psychiatric/Behavioral: Negative.  The patient is not nervous/anxious and does not have insomnia.     As per HPI. Otherwise, a complete review of systems is negative.  PAST MEDICAL HISTORY: Past Medical History:  Diagnosis Date  . Anxiety   . Arthritis   . Breast cancer [facility]) 2014   Right breast cancer - chemo, radiation and Mastectomy  . CHF (congestive heart  failure) ([place]) 2013  . Chronic kidney disease   . Clotting disorder ([place])    blood clots  . Depression   . GERD (gastroesophageal reflux disease)   . GI bleed   . H/O heart artery stent   . Headache   . Heart attack [facility]) 2012   coronary stent x2 completed by Dr. Clayborn Bigness.  Marland Kitchen Heart attack ([place]) Apr 29, 2013  . Hemorrhoids   . Hypertension   . Hypothyroidism   . IBS (irritable bowel syndrome)   . Malignant neoplasm of lower-inner quadrant of female breast [facility]) January 20, 2013.   invasive mammary cancer, minimal 0.85 cm.  Histologic grade 1.  . Personal history of chemotherapy 2014   BREAST CA  . Personal history of radiation therapy 2014   BREAST CA  . Thyroid disease     PAST SURGICAL HISTORY: Past Surgical History:  Procedure Laterality Date  . BOTOX INJECTION N/A 03/14/2017   Procedure: BOTOX INJECTION;  Surgeon: Jules Husbands, MD;  Location: [facility] ORS;  Service: General;  Laterality: N/A;  . BOTOX INJECTION N/A 05/09/2018   Procedure: BOTOX INJECTION;  Surgeon: Jules Husbands, MD;  Location: [facility] ORS;  Service: General;  Laterality: N/A;  . BREAST BIOPSY Right 2014   positive  . BREAST SURGERY Right 02-21-2013   right mastectomy  . CARDIAC CATHETERIZATION    . COLONOSCOPY W/ BIOPSIES  09/15/2010   Colonoscopy completed by Verdie Shire, M.D. Tubular adenoma of the ascending colon and descending colon reported up to 0.4 cm in diameter. No atypia.  . COLONOSCOPY WITH PROPOFOL N/A 12/23/2016   Procedure: COLONOSCOPY WITH PROPOFOL;  Surgeon: Wilford Corner, MD;  Location: [place];  Service: Endoscopy;  Laterality: N/A;  . ESOPHAGOGASTRODUODENOSCOPY N/A 12/13/2016   Procedure: ESOPHAGOGASTRODUODENOSCOPY (EGD);  Surgeon: [name], MD;  Location: [facility] ENDOSCOPY;  Service: Endoscopy;  Laterality: N/A;  . LEFT HEART CATH AND CORONARY ANGIOGRAPHY Left 08/29/2018   Procedure: LEFT HEART CATH AND CORONARY ANGIOGRAPHY;  Surgeon: Dionisio David, MD;  Location: [place] CV LAB;   Service: Cardiovascular;  Laterality: Left;  Marland Kitchen MASTECTOMY Right 2014   BREAST CA  . PORTA CATH INSERTION    . SPHINCTEROTOMY N/A 05/09/2018   Procedure: SPHINCTEROTOMY;  Surgeon: Jules Husbands, MD;  Location: [facility] ORS;  Service: General;  Laterality: N/A;  . UPPER GI ENDOSCOPY  09/15/2010     Completed by Verdie Shire, M.D. for nausea. Normal exam reported.  Marland Kitchen VASCULAR SURGERY      FAMILY HISTORY Family History  Problem Relation Age of Onset  . Breast cancer Mother 37  . Cancer Father        colon       ADVANCED DIRECTIVES:    HEALTH MAINTENANCE: Social History   Tobacco Use  . Smoking status: Former Smoker    Years: 20.00  . Smokeless tobacco: Never Used  Substance Use Topics  . Alcohol use: No  . Drug use: No     Colonoscopy:  PAP:  Bone density:  Lipid panel:  No Known Allergies  Current Outpatient Medications  Medication Sig Dispense Refill  . acetaminophen (TYLENOL 8 HOUR ARTHRITIS PAIN) 650 MG CR tablet Take 1,300 mg by mouth every 8 (eight) hours as needed for pain.     Marland Kitchen alendronate (FOSAMAX) 70 MG tablet Take 1 tablet (70 mg total) by mouth once a week. Take with a full glass of water on an empty stomach. (Patient taking differently: Take 70 mg by mouth every Monday. Take with a full glass of water on an empty stomach.) 12 tablet 3  . ALPRAZolam (XANAX) 0.25 MG tablet Take 0.25 mg by mouth 2 (two) times daily as needed for anxiety.   2  . atorvastatin (LIPITOR) 40 MG tablet Take 40 mg by mouth daily.     Marland Kitchen azelastine (ASTELIN) 0.1 % nasal spray Place 1 spray into both nostrils 2 (two) times daily as needed for rhinitis.   4  . clopidogrel (PLAVIX) 75 MG tablet Take 75 mg by mouth daily.     . [employer] ALLERGY RELIEF 4 MG tablet Take 4 mg by mouth every 12 (twelve) hours as needed for allergies.   3  . fexofenadine (ALLEGRA) 60 MG tablet Take 30 mg by mouth 2 (two) times daily as needed (allergies.).     Marland Kitchen gabapentin (NEURONTIN) 300 MG capsule Take 1 capsule (300 mg  total) by mouth 3 (three) times daily. 90 capsule 0  . hydrALAZINE (APRESOLINE) 25 MG tablet Take 25 mg by mouth 3 (three) times daily.    . isosorbide mononitrate (IMDUR) 30 MG 24 hr tablet Take 30 mg by mouth daily with breakfast.    . letrozole (FEMARA) 2.5 MG tablet TAKE 1 TABLET BY MOUTH  DAILY (Patient taking differently: Take 2.5 mg by mouth daily. ) 90 tablet 1  . levothyroxine (SYNTHROID, LEVOTHROID) 88 MCG tablet Take 88 mcg by mouth daily before breakfast.     . meclizine (ANTIVERT) 25 MG tablet Take 2 tablets (50 mg total) by mouth 3 (three) times daily as needed for dizziness or nausea. (Patient taking differently: Take 25 mg by mouth 3 (three) times daily as needed for dizziness or nausea. )  30 tablet 1  . montelukast (SINGULAIR) 10 MG tablet Take 10 mg by mouth at bedtime.    Marland Kitchen nystatin (MYCOSTATIN/NYSTOP) powder Apply 1 g topically 3 (three) times daily as needed (for rash).   1  . Olopatadine HCl 0.2 % SOLN Place 1 drop into both eyes daily as needed (for allergies).   2  . pantoprazole (PROTONIX) 40 MG tablet TAKE 1 TABLET BY MOUTH  DAILY (Patient taking differently: Take 40 mg by mouth daily. ) 90 tablet 1  . PROAIR HFA 108 (90 Base) MCG/ACT inhaler Inhale 2 puffs into the lungs every 4 (four) hours as needed for wheezing or shortness of breath.   5  . promethazine (PHENERGAN) 12.5 MG tablet Take 12.5 mg by mouth every 6 (six) hours as needed for nausea or vomiting.    . ranolazine (RANEXA) 1000 MG SR tablet Take 500 mg by mouth 2 (two) times daily.    . temazepam (RESTORIL) 30 MG capsule Take 30 mg by mouth at bedtime.     . traZODone (DESYREL) 50 MG tablet Take 50 mg by mouth at bedtime.     . hydrocortisone (ANUSOL-HC) 2.5 % rectal cream Place 1 application rectally 2 (two) times daily. 30 g 1   No current facility-administered medications for this visit.    Facility-Administered Medications Ordered in Other Visits  Medication Dose Route Frequency Provider Last Rate Last  Dose  . 0.9 %  sodium chloride infusion  250 mL Intravenous PRN Neoma Laming A, MD      . 0.9 %  sodium chloride infusion  250 mL Intravenous PRN Neoma Laming A, MD      . 0.9% sodium chloride infusion  1 mL/kg/hr Intravenous Continuous Neoma Laming A, MD      . 0.9% sodium chloride infusion  1 mL/kg/hr Intravenous Continuous Dionisio David, MD   Stopped at 08/29/18 1303  . acetaminophen (TYLENOL) tablet 650 mg  650 mg Oral Q4H PRN Neoma Laming A, MD      . aspirin 81 MG chewable tablet           . ondansetron (ZOFRAN) injection 4 mg  4 mg Intravenous Q6H PRN Neoma Laming A, MD      . sodium chloride flush (NS) 0.9 % injection 3 mL  3 mL Intravenous Q12H Neoma Laming A, MD      . sodium chloride flush (NS) 0.9 % injection 3 mL  3 mL Intravenous PRN Neoma Laming A, MD      . sodium chloride flush (NS) 0.9 % injection 3 mL  3 mL Intravenous Q12H Neoma Laming A, MD      . sodium chloride flush (NS) 0.9 % injection 3 mL  3 mL Intravenous PRN Neoma Laming A, MD        OBJECTIVE: Vitals:   08/27/18 1450  BP: 128/79  Pulse: 80  Temp: (!) 97 F (36.1 C)     Body mass index is 30.74 kg/m.    ECOG FS:0 - Asymptomatic  General: Well-developed, well-nourished, no acute distress. Eyes: Pink conjunctiva, anicteric sclera. HEENT: Normocephalic, moist mucous membranes. Breast: Patient declined breast exam today. Lungs: Clear to auscultation bilaterally. Heart: Regular rate and rhythm. No rubs, murmurs, or gallops. Abdomen: Soft, nontender, nondistended. No organomegaly noted, normoactive bowel sounds. Musculoskeletal: No edema, cyanosis, or clubbing. Neuro: Alert, answering all questions appropriately. Cranial nerves grossly intact. Skin: No rashes or petechiae noted. Psych: Normal affect.  LAB RESULTS:  Lab Results  Component Value Date  NA 139 05/02/2018   K 4.3 05/02/2018   CL 101 05/02/2018   CO2 30 05/02/2018   GLUCOSE 112 (H) 05/02/2018   BUN 17 05/02/2018   CREATININE  1.43 (H) 05/02/2018   CALCIUM 10.0 05/02/2018   PROT 6.0 (L) 12/22/2016   ALBUMIN 3.3 (L) 12/22/2016   AST 18 12/22/2016   ALT 15 12/22/2016   ALKPHOS 76 12/22/2016   BILITOT 0.8 12/22/2016   GFRNONAA 37 (L) 05/02/2018   GFRAA 43 (L) 05/02/2018    Lab Results  Component Value Date   WBC 6.1 05/02/2018   NEUTROABS 5.1 11/02/2016   HGB 11.2 (L) 05/02/2018   HCT 35.2 (L) 05/02/2018   MCV 93.9 05/02/2018   PLT 391 05/02/2018     STUDIES: No results found.  ASSESSMENT: Pathologic stage IIIa triple positive invasive carcinoma of the lower inner quadrant of the right breast.   PLAN:    1. Pathologic stage IIIa triple positive invasive carcinoma of the lower inner quadrant of the right breast: Patient has now completed all of her treatments including XRT. She only completed 14 of 18 infusions of Herceptin secondary to persistent nausea. Her CA 27.29 continues to be within normal limits.  Continue letrozole for a total of 5 years completing treatment in October 2020, although given the high stage of disease and risk of recurrence may extend treatment 1-2 additional years. Patient's most recent mammogram on October 15, 2017 was reported as BI-RADS 1.  Repeat in April 2020.  Return to clinic in 6 months for routine evaluation. 2. Osteoporosis: Bone mineral density on October 15, 2017 reported T score of -2.6 which is slightly worse than one year prior.  Continue Fosamax, calcium, and vitamin D.  Repeat in April 2020. 3.  Anal fissure: Continue monitoring and treatment per surgery.  Patient expressed understanding and was in agreement with this plan. She also understands that She can call clinic at any time with any questions, concerns, or complaints.   Breast cancer of lower-inner quadrant of right female breast   Staging form: Breast, AJCC 7th Edition     Pathologic stage from 02/24/2015: Stage IIIA (T1c, N2b, cM0) - Signed by Lloyd Huger, MD on 02/24/2015   Lloyd Huger, MD  08/29/18 5:40 PM

## 2018-08-27 ENCOUNTER — Other Ambulatory Visit: Payer: Self-pay | Admitting: Nurse Practitioner

## 2018-08-27 ENCOUNTER — Inpatient Hospital Stay: Payer: Medicare Other | Attending: Oncology | Admitting: Oncology

## 2018-08-27 ENCOUNTER — Other Ambulatory Visit: Payer: Self-pay

## 2018-08-27 VITALS — BP 128/79 | HR 80 | Temp 97.0°F | Ht 68.0 in | Wt 202.2 lb

## 2018-08-27 DIAGNOSIS — K602 Anal fissure, unspecified: Secondary | ICD-10-CM | POA: Diagnosis not present

## 2018-08-27 DIAGNOSIS — Z7982 Long term (current) use of aspirin: Secondary | ICD-10-CM | POA: Insufficient documentation

## 2018-08-27 DIAGNOSIS — Z1231 Encounter for screening mammogram for malignant neoplasm of breast: Secondary | ICD-10-CM

## 2018-08-27 DIAGNOSIS — Z170 Estrogen receptor positive status [ER+]: Secondary | ICD-10-CM | POA: Insufficient documentation

## 2018-08-27 DIAGNOSIS — I10 Essential (primary) hypertension: Secondary | ICD-10-CM | POA: Diagnosis not present

## 2018-08-27 DIAGNOSIS — Z87891 Personal history of nicotine dependence: Secondary | ICD-10-CM | POA: Diagnosis not present

## 2018-08-27 DIAGNOSIS — Z9011 Acquired absence of right breast and nipple: Secondary | ICD-10-CM

## 2018-08-27 DIAGNOSIS — C50311 Malignant neoplasm of lower-inner quadrant of right female breast: Secondary | ICD-10-CM | POA: Diagnosis present

## 2018-08-27 DIAGNOSIS — Z923 Personal history of irradiation: Secondary | ICD-10-CM

## 2018-08-27 DIAGNOSIS — Z79811 Long term (current) use of aromatase inhibitors: Secondary | ICD-10-CM | POA: Diagnosis not present

## 2018-08-27 DIAGNOSIS — Z79899 Other long term (current) drug therapy: Secondary | ICD-10-CM | POA: Diagnosis not present

## 2018-08-27 DIAGNOSIS — E039 Hypothyroidism, unspecified: Secondary | ICD-10-CM | POA: Insufficient documentation

## 2018-08-27 NOTE — Progress Notes (Signed)
Patient is here today to follow up on her right breast cancer. Patient stated that she had been doing well when it comes to her rectum and chest (patient has other providers for that).

## 2018-08-28 ENCOUNTER — Encounter: Payer: Self-pay | Admitting: Surgery

## 2018-08-28 ENCOUNTER — Other Ambulatory Visit: Payer: Self-pay

## 2018-08-28 ENCOUNTER — Ambulatory Visit (INDEPENDENT_AMBULATORY_CARE_PROVIDER_SITE_OTHER): Payer: Medicare Other | Admitting: Surgery

## 2018-08-28 VITALS — BP 113/74 | HR 79 | Temp 97.0°F | Resp 18 | Ht 68.0 in | Wt 201.0 lb

## 2018-08-28 DIAGNOSIS — K602 Anal fissure, unspecified: Secondary | ICD-10-CM

## 2018-08-28 MED ORDER — HYDROCORTISONE 2.5 % RE CREA: 1.0000 "application " | TOPICAL_CREAM | Freq: Two times a day (BID) | RECTAL | 1 refills | Status: DC

## 2018-08-28 NOTE — Patient Instructions (Addendum)
The patient is aware to call back for any questions or new concerns. May apply Vaseline to irritated rectal area Refill for Anusol cream sent to pharmacy Follow up in one month continue sitz bath

## 2018-08-29 ENCOUNTER — Ambulatory Visit
Admission: RE | Admit: 2018-08-29 | Discharge: 2018-08-29 | Disposition: A | Discharge status: Home / Self Care | Payer: Medicare Other | Source: Ambulatory Visit | Attending: Cardiovascular Disease | Admitting: Cardiovascular Disease

## 2018-08-29 ENCOUNTER — Encounter
Admission: RE | Disposition: A | Discharge status: Home / Self Care | Payer: Self-pay | Source: Ambulatory Visit | Attending: Cardiovascular Disease

## 2018-08-29 DIAGNOSIS — Z8249 Family history of ischemic heart disease and other diseases of the circulatory system: Secondary | ICD-10-CM | POA: Insufficient documentation

## 2018-08-29 DIAGNOSIS — I25110 Atherosclerotic heart disease of native coronary artery with unstable angina pectoris: Secondary | ICD-10-CM | POA: Insufficient documentation

## 2018-08-29 DIAGNOSIS — I129 Hypertensive chronic kidney disease with stage 1 through stage 4 chronic kidney disease, or unspecified chronic kidney disease: Secondary | ICD-10-CM | POA: Insufficient documentation

## 2018-08-29 DIAGNOSIS — G4700 Insomnia, unspecified: Secondary | ICD-10-CM | POA: Insufficient documentation

## 2018-08-29 DIAGNOSIS — K589 Irritable bowel syndrome without diarrhea: Secondary | ICD-10-CM | POA: Insufficient documentation

## 2018-08-29 DIAGNOSIS — Z955 Presence of coronary angioplasty implant and graft: Secondary | ICD-10-CM | POA: Insufficient documentation

## 2018-08-29 DIAGNOSIS — Z87891 Personal history of nicotine dependence: Secondary | ICD-10-CM | POA: Diagnosis not present

## 2018-08-29 DIAGNOSIS — E079 Disorder of thyroid, unspecified: Secondary | ICD-10-CM | POA: Insufficient documentation

## 2018-08-29 DIAGNOSIS — M1990 Unspecified osteoarthritis, unspecified site: Secondary | ICD-10-CM | POA: Diagnosis not present

## 2018-08-29 DIAGNOSIS — Z7902 Long term (current) use of antithrombotics/antiplatelets: Secondary | ICD-10-CM | POA: Diagnosis not present

## 2018-08-29 DIAGNOSIS — E785 Hyperlipidemia, unspecified: Secondary | ICD-10-CM | POA: Diagnosis not present

## 2018-08-29 DIAGNOSIS — N183 Chronic kidney disease, stage 3 (moderate): Secondary | ICD-10-CM | POA: Insufficient documentation

## 2018-08-29 DIAGNOSIS — R0602 Shortness of breath: Secondary | ICD-10-CM | POA: Insufficient documentation

## 2018-08-29 DIAGNOSIS — Z881 Allergy status to other antibiotic agents status: Secondary | ICD-10-CM | POA: Diagnosis not present

## 2018-08-29 DIAGNOSIS — Z9011 Acquired absence of right breast and nipple: Secondary | ICD-10-CM | POA: Diagnosis not present

## 2018-08-29 DIAGNOSIS — Z7982 Long term (current) use of aspirin: Secondary | ICD-10-CM | POA: Diagnosis not present

## 2018-08-29 DIAGNOSIS — Z79890 Hormone replacement therapy (postmenopausal): Secondary | ICD-10-CM | POA: Diagnosis not present

## 2018-08-29 DIAGNOSIS — I200 Unstable angina: Secondary | ICD-10-CM | POA: Diagnosis present

## 2018-08-29 DIAGNOSIS — Z79899 Other long term (current) drug therapy: Secondary | ICD-10-CM | POA: Diagnosis not present

## 2018-08-29 DIAGNOSIS — Z888 Allergy status to other drugs, medicaments and biological substances status: Secondary | ICD-10-CM | POA: Insufficient documentation

## 2018-08-29 DIAGNOSIS — I249 Acute ischemic heart disease, unspecified: Secondary | ICD-10-CM | POA: Insufficient documentation

## 2018-08-29 HISTORY — PX: LEFT HEART CATH AND CORONARY ANGIOGRAPHY: CATH118249

## 2018-08-29 LAB — PROTIME-INR
INR: 0.96
Prothrombin Time: 12.7 seconds (ref 11.4–15.2)

## 2018-08-29 SURGERY — LEFT HEART CATH AND CORONARY ANGIOGRAPHY
Anesthesia: Moderate Sedation | Laterality: Left

## 2018-08-29 MED ORDER — ONDANSETRON HCL 4 MG/2ML IJ SOLN: 4.0000 mg | Freq: Four times a day (QID) | INTRAMUSCULAR | Status: DC | PRN

## 2018-08-29 MED ORDER — SODIUM CHLORIDE 0.9% FLUSH: 3.0000 mL | Freq: Two times a day (BID) | INTRAVENOUS | Status: DC

## 2018-08-29 MED ORDER — SODIUM CHLORIDE 0.9 % WEIGHT BASED INFUSION: 1.0000 mL/kg/h | INTRAVENOUS | Status: DC

## 2018-08-29 MED ORDER — ASPIRIN 81 MG PO CHEW
CHEWABLE_TABLET | ORAL | Status: AC
  Filled 2018-08-29: qty 1

## 2018-08-29 MED ORDER — HEPARIN (PORCINE) IN NACL 1000-0.9 UT/500ML-% IV SOLN
INTRAVENOUS | Status: DC | PRN
  Administered 2018-08-29: 500 mL

## 2018-08-29 MED ORDER — FENTANYL CITRATE (PF) 100 MCG/2ML IJ SOLN
INTRAMUSCULAR | Status: AC
  Filled 2018-08-29: qty 2

## 2018-08-29 MED ORDER — NITROGLYCERIN 5 MG/ML IV SOLN
INTRAVENOUS | Status: AC
  Filled 2018-08-29: qty 10

## 2018-08-29 MED ORDER — NITROGLYCERIN 1 MG/10 ML FOR IR/CATH LAB
INTRA_ARTERIAL | Status: DC | PRN
  Administered 2018-08-29: 100 ug via INTRACORONARY

## 2018-08-29 MED ORDER — FENTANYL CITRATE (PF) 100 MCG/2ML IJ SOLN
INTRAMUSCULAR | Status: DC | PRN
  Administered 2018-08-29 (×2): 25 ug via INTRAVENOUS

## 2018-08-29 MED ORDER — HEPARIN (PORCINE) IN NACL 1000-0.9 UT/500ML-% IV SOLN
INTRAVENOUS | Status: AC
  Filled 2018-08-29: qty 1000

## 2018-08-29 MED ORDER — ACETAMINOPHEN 325 MG PO TABS: 650.0000 mg | ORAL_TABLET | ORAL | Status: DC | PRN

## 2018-08-29 MED ORDER — SODIUM CHLORIDE 0.9 % IV SOLN
Freq: Once | INTRAVENOUS | Status: AC
  Administered 2018-08-29: 13:00:00 via INTRAVENOUS

## 2018-08-29 MED ORDER — SODIUM CHLORIDE 0.9% FLUSH: 3.0000 mL | INTRAVENOUS | Status: DC | PRN

## 2018-08-29 MED ORDER — SODIUM CHLORIDE 0.9 % IV SOLN: 250.0000 mL | INTRAVENOUS | Status: DC | PRN

## 2018-08-29 MED ORDER — MIDAZOLAM HCL 2 MG/2ML IJ SOLN
INTRAMUSCULAR | Status: AC
  Filled 2018-08-29: qty 2

## 2018-08-29 MED ORDER — SODIUM CHLORIDE 0.9 % WEIGHT BASED INFUSION
3.0000 mL/kg/h | INTRAVENOUS | Status: AC
  Administered 2018-08-29: 3 mL/kg/h via INTRAVENOUS

## 2018-08-29 MED ORDER — MIDAZOLAM HCL 2 MG/2ML IJ SOLN
INTRAMUSCULAR | Status: DC | PRN
  Administered 2018-08-29: 1 mg via INTRAVENOUS

## 2018-08-29 MED ORDER — ASPIRIN 81 MG PO CHEW
81.0000 mg | CHEWABLE_TABLET | ORAL | Status: AC
  Administered 2018-08-29: 81 mg via ORAL

## 2018-08-29 MED ORDER — IOPAMIDOL (ISOVUE-300) INJECTION 61%
INTRAVENOUS | Status: DC | PRN
  Administered 2018-08-29: 70 mL via INTRA_ARTERIAL

## 2018-08-29 MED ORDER — SODIUM CHLORIDE 0.9 % IV SOLN: INTRAVENOUS | Status: AC | PRN

## 2018-08-29 SURGICAL SUPPLY — 11 items
CATH INFINITI 5FR ANG PIGTAIL (CATHETERS) ×3 IMPLANT
CATH INFINITI 5FR JL4 (CATHETERS) ×3 IMPLANT
CATH INFINITI JR4 5F (CATHETERS) ×3 IMPLANT
DEVICE CLOSURE MYNXGRIP 5F (Vascular Products) ×3 IMPLANT
GLIDESHEATH SLEND SS 6F .021 (SHEATH) IMPLANT
KIT MANI 3VAL PERCEP (MISCELLANEOUS) ×3 IMPLANT
NEEDLE PERC 18GX7CM (NEEDLE) ×3 IMPLANT
PACK CARDIAC CATH (CUSTOM PROCEDURE TRAY) ×3 IMPLANT
SHEATH AVANTI 5FR X 11CM (SHEATH) ×3 IMPLANT
WIRE GUIDERIGHT .035X150 (WIRE) ×6 IMPLANT
WIRE ROSEN-J .035X260CM (WIRE) IMPLANT

## 2018-08-29 NOTE — Discharge Instructions (Signed)
Moderate Conscious Sedation, Adult, Care After °These instructions provide you with information about caring for yourself after your procedure. Your health care provider may also give you more specific instructions. Your treatment has been planned according to current medical practices, but problems sometimes occur. Call your health care provider if you have any problems or questions after your procedure. °What can I expect after the procedure? °After your procedure, it is common: °· To feel sleepy for several hours. °· To feel clumsy and have poor balance for several hours. °· To have poor judgment for several hours. °· To vomit if you eat too soon. °Follow these instructions at home: °For at least 24 hours after the procedure: ° °· Do not: °? Participate in activities where you could fall or become injured. °? Drive. °? Use heavy machinery. °? Drink alcohol. °? Take sleeping pills or medicines that cause drowsiness. °? Make important decisions or sign legal documents. °? Take care of children on your own. °· Rest. °Eating and drinking °· Follow the diet recommended by your health care provider. °· If you vomit: °? Drink water, juice, or soup when you can drink without vomiting. °? Make sure you have little or no nausea before eating solid foods. °General instructions °· Have a responsible adult stay with you until you are awake and alert. °· Take over-the-counter and prescription medicines only as told by your health care provider. °· If you smoke, do not smoke without supervision. °· Keep all follow-up visits as told by your health care provider. This is important. °Contact a health care provider if: °· You keep feeling nauseous or you keep vomiting. °· You feel light-headed. °· You develop a rash. °· You have a fever. °Get help right away if: °· You have trouble breathing. °This information is not intended to replace advice given to you by your health care provider. Make sure you discuss any questions you have  with your health care provider. °Document Released: 04/23/2013 Document Revised: 12/06/2015 Document Reviewed: 10/23/2015 °Elsevier Interactive Patient Education © 2019 [employer] °Angiogram, Care After °This sheet gives you information about how to care for yourself after your procedure. Your doctor may also give you more specific instructions. If you have problems or questions, contact your doctor. °Follow these instructions at home: °Insertion site care °· Follow instructions from your doctor about how to take care of your long, thin tube (catheter) insertion area. Make sure you: °? Wash your hands with soap and water before you change your bandage (dressing). If you cannot use soap and water, use hand sanitizer. °? Change your bandage as told by your doctor. °? Leave stitches (sutures), skin glue, or skin tape (adhesive) strips in place. They may need to stay in place for 2 weeks or longer. If tape strips get loose and curl up, you may trim the loose edges. Do not remove tape strips completely unless your doctor says it is okay. °· Do not take baths, swim, or use a hot tub until your doctor says it is okay. °· You may shower 24-48 hours after the procedure or as told by your doctor. °? Gently wash the area with plain soap and water. °? Pat the area dry with a clean towel. °? Do not rub the area. This may cause bleeding. °· Do not apply powder or lotion to the area. Keep the area clean and dry. °· Check your insertion area every day for signs of infection. Check for: °? More redness, swelling, or pain. °?   Fluid or blood. °? Warmth. °? Pus or a bad smell. °Activity °· Rest as told by your doctor, usually for 1-2 days. °· Do not lift anything that is heavier than 10 lbs. (4.5 kg) or as told by your doctor. °· Do not drive for 24 hours if you were given a medicine to help you relax (sedative). °· Do not drive or use heavy machinery while taking prescription pain medicine. °General instructions ° °· Go back to  your normal activities as told by your doctor, usually in about a week. Ask your doctor what activities are safe for you. °· If the insertion area starts to bleed, lie flat and put pressure on the area. If the bleeding does not stop, get help right away. This is an emergency. °· Drink enough fluid to keep your pee (urine) clear or pale yellow. °· Take over-the-counter and prescription medicines only as told by your doctor. °· Keep all follow-up visits as told by your doctor. This is important. °Contact a doctor if: °· You have a fever. °· You have chills. °· You have more redness, swelling, or pain around your insertion area. °· You have fluid or blood coming from your insertion area. °· The insertion area feels warm to the touch. °· You have pus or a bad smell coming from your insertion area. °· You have more bruising around the insertion area. °· Blood collects in the tissue around the insertion area (hematoma) that may be painful to the touch. °Get help right away if: °· You have a lot of pain in the insertion area. °· The insertion area swells very fast. °· The insertion area is bleeding, and the bleeding does not stop after holding steady pressure on the area. °· The area near or just beyond the insertion area becomes pale, cool, tingly, or numb. °These symptoms may be an emergency. Do not wait to see if the symptoms will go away. Get medical help right away. Call your local emergency services (911 in the [place] Do not drive yourself to the hospital. °Summary °· After the procedure, it is common to have bruising and tenderness at the long, thin tube insertion area. °· After the procedure, it is important to rest and drink plenty of fluids. °· Do not take baths, swim, or use a hot tub until your doctor says it is okay to do so. You may shower 24-48 hours after the procedure or as told by your doctor. °· If the insertion area starts to bleed, lie flat and put pressure on the area. If the bleeding does not stop,  get help right away. This is an emergency. °This information is not intended to replace advice given to you by your health care provider. Make sure you discuss any questions you have with your health care provider. °Document Released: 09/29/2008 Document Revised: 06/27/2016 Document Reviewed: 06/27/2016 °Elsevier Interactive Patient Education © 2019 [employer] ° °

## 2018-08-30 ENCOUNTER — Encounter: Payer: Self-pay | Admitting: Surgery

## 2018-08-30 NOTE — Progress Notes (Signed)
Outpatient Surgical Follow Up  08/30/2018  Abigail Ortega is an 68 y.o. female.   Chief Complaint  Patient presents with  . Follow-up     f/u anal fissurectomy done 05-09-18    HPI: Feels better overall from her anorectal discomfort.  In addition is a bit and superficial ulceration in the right gluteal area.  No fevers no chills.  She does have an upcoming cardiac cath.  Rectal bleeding.  Past Medical History:  Diagnosis Date  . Anxiety   . Arthritis   . Breast cancer [facility]) 2014   Right breast cancer - chemo, radiation and Mastectomy  . CHF (congestive heart failure) ([place]) 2013  . Chronic kidney disease   . Clotting disorder ([place])    blood clots  . Depression   . GERD (gastroesophageal reflux disease)   . GI bleed   . H/O heart artery stent   . Headache   . Heart attack [facility]) 2012   coronary stent x2 completed by Dr. Clayborn Bigness.  Marland Kitchen Heart attack ([place]) Apr 29, 2013  . Hemorrhoids   . Hypertension   . Hypothyroidism   . IBS (irritable bowel syndrome)   . Malignant neoplasm of lower-inner quadrant of female breast [facility]) January 20, 2013.   invasive mammary cancer, minimal 0.85 cm.  Histologic grade 1.  . Personal history of chemotherapy 2014   BREAST CA  . Personal history of radiation therapy 2014   BREAST CA  . Thyroid disease     Past Surgical History:  Procedure Laterality Date  . BOTOX INJECTION N/A 03/14/2017   Procedure: BOTOX INJECTION;  Surgeon: Jules Husbands, MD;  Location: [facility] ORS;  Service: General;  Laterality: N/A;  . BOTOX INJECTION N/A 05/09/2018   Procedure: BOTOX INJECTION;  Surgeon: Jules Husbands, MD;  Location: [facility] ORS;  Service: General;  Laterality: N/A;  . BREAST BIOPSY Right 2014   positive  . BREAST SURGERY Right 02-21-2013   right mastectomy  . CARDIAC CATHETERIZATION    . COLONOSCOPY W/ BIOPSIES  09/15/2010   Colonoscopy completed by Verdie Shire, M.D. Tubular adenoma of the ascending colon and descending colon reported up to 0.4 cm in  diameter. No atypia.  . COLONOSCOPY WITH PROPOFOL N/A 12/23/2016   Procedure: COLONOSCOPY WITH PROPOFOL;  Surgeon: Wilford Corner, MD;  Location[facility] ENDOSCOPY;  Service: Endoscopy;  Laterality: N/A;  . ESOPHAGOGASTRODUODENOSCOPY N/A 12/13/2016   Procedure: ESOPHAGOGASTRODUODENOSCOPY (EGD);  Surgeon: [name], MD;  Location: [facility] ENDOSCOPY;  Service: Endoscopy;  Laterality: N/A;  . LEFT HEART CATH AND CORONARY ANGIOGRAPHY Left 08/29/2018   Procedure: LEFT HEART CATH AND CORONARY ANGIOGRAPHY;  Surgeon: Dionisio David, MD;  Location: [place] CV LAB;  Service: Cardiovascular;  Laterality: Left;  Marland Kitchen MASTECTOMY Right 2014   BREAST CA  . PORTA CATH INSERTION    . SPHINCTEROTOMY N/A 05/09/2018   Procedure: SPHINCTEROTOMY;  Surgeon: Jules Husbands, MD;  Location: [facility] ORS;  Service: General;  Laterality: N/A;  . UPPER GI ENDOSCOPY  09/15/2010     Completed by Verdie Shire, M.D. for nausea. Normal exam reported.  Marland Kitchen VASCULAR SURGERY      Family History  Problem Relation Age of Onset  . Breast cancer Mother 52  . Cancer Father        colon    Social History:  reports that she has quit smoking. She quit after 20.00 years of use. She has never used smokeless tobacco. She reports that she does not drink alcohol or use drugs.  Allergies: No Known Allergies  Medications reviewed.    ROS Full ROS performed and is otherwise negative other than what is stated in HPI   BP 113/74   Pulse 79   Temp (!) 97 F (36.1 C) (Temporal)   Resp 18   Ht 5\' 8"  (1.727 m)   Wt 201 lb (91.2 kg)   SpO2 99%   BMI 30.56 kg/m   Physical Exam Pulmonary:     Effort: Pulmonary effort is normal.     Breath sounds: No stridor.  Abdominal:     General: Abdomen is flat. There is no distension.     Palpations: Abdomen is soft. There is no mass.     Tenderness: There is no abdominal tenderness. There is no guarding or rebound.     Hernia: No hernia is present.  Genitourinary:    Comments: On the right  lateral gluteal aspect there is some breakdown of the skin w superficial ulceration , likely from dermatitis. No evidence of abscess. Rectal exam w improvement of tone. I can not visualize fissure today Musculoskeletal: Normal range of motion.  Skin:    Capillary Refill: Capillary refill takes less than 2 seconds.  Neurological:     General: No focal deficit present.     Mental Status: She is oriented to person, place, and time.  Psychiatric:        Mood and Affect: Mood normal.        Behavior: Behavior normal.        Thought Content: Thought content normal.        Judgment: Judgment normal.       Assessment/Plan: Superficial ulcerations on the right gluteus likely from contact dermatitis.  No evidence of an abscess. We will continue medical treatment including sitz baths and nifedipine cream.  Follow-up in about a month or so   Caroleen Hamman, MD [place] Surgeon

## 2018-09-30 ENCOUNTER — Ambulatory Visit (INDEPENDENT_AMBULATORY_CARE_PROVIDER_SITE_OTHER): Payer: Medicare Other | Admitting: Surgery

## 2018-09-30 ENCOUNTER — Other Ambulatory Visit: Payer: Self-pay

## 2018-09-30 ENCOUNTER — Encounter: Payer: Self-pay | Admitting: Surgery

## 2018-09-30 VITALS — BP 138/74 | HR 70 | Temp 97.8°F | Ht 69.0 in | Wt 200.0 lb

## 2018-09-30 DIAGNOSIS — K642 Third degree hemorrhoids: Secondary | ICD-10-CM | POA: Diagnosis not present

## 2018-09-30 NOTE — Patient Instructions (Signed)
Continue to take sitz baths. Return as needed.The patient is aware to call back for any questions or concerns.

## 2018-10-01 ENCOUNTER — Encounter: Payer: Self-pay | Admitting: Surgery

## 2018-10-01 NOTE — Progress Notes (Signed)
Outpatient Surgical Follow Up  10/01/2018  Abigail Ortega is an 68 y.o. female.   Chief Complaint  Patient presents with  . Follow-up    anal fissure     HPI: Abigail Ortega is following up for an anal fissure. Fissure perspective she is doing well.  She is doing sitz bath on high-fiber.  Now she reports some protrusion of rectal tissue after she has a bowel movement.  She is able to push it back in.  No fevers no chills no hematochezia.  Occasional rectal discomfort  Past Medical History:  Diagnosis Date  . Anxiety   . Arthritis   . Breast cancer [facility]) 2014   Right breast cancer - chemo, radiation and Mastectomy  . CHF (congestive heart failure) ([place]) 2013  . Chronic kidney disease   . Clotting disorder ([place])    blood clots  . Depression   . GERD (gastroesophageal reflux disease)   . GI bleed   . H/O heart artery stent   . Headache   . Heart attack [facility]) 2012   coronary stent x2 completed by Dr. Clayborn Bigness.  Marland Kitchen Heart attack ([place]) Apr 29, 2013  . Hemorrhoids   . Hypertension   . Hypothyroidism   . IBS (irritable bowel syndrome)   . Malignant neoplasm of lower-inner quadrant of female breast [facility]) January 20, 2013.   invasive mammary cancer, minimal 0.85 cm.  Histologic grade 1.  . Personal history of chemotherapy 2014   BREAST CA  . Personal history of radiation therapy 2014   BREAST CA  . Thyroid disease     Past Surgical History:  Procedure Laterality Date  . BOTOX INJECTION N/A 03/14/2017   Procedure: BOTOX INJECTION;  Surgeon: Jules Husbands, MD;  Location: [facility] ORS;  Service: General;  Laterality: N/A;  . BOTOX INJECTION N/A 05/09/2018   Procedure: BOTOX INJECTION;  Surgeon: Jules Husbands, MD;  Location: [facility] ORS;  Service: General;  Laterality: N/A;  . BREAST BIOPSY Right 2014   positive  . BREAST SURGERY Right 02-21-2013   right mastectomy  . CARDIAC CATHETERIZATION    . COLONOSCOPY W/ BIOPSIES  09/15/2010   Colonoscopy completed by Verdie Shire, M.D. Tubular  adenoma of the ascending colon and descending colon reported up to 0.4 cm in diameter. No atypia.  . COLONOSCOPY WITH PROPOFOL N/A 12/23/2016   Procedure: COLONOSCOPY WITH PROPOFOL;  Surgeon: Wilford Corner, MD;  Location: [facility] ENDOSCOPY;  Service: Endoscopy;  Laterality: N/A;  . ESOPHAGOGASTRODUODENOSCOPY N/A 12/13/2016   Procedure: ESOPHAGOGASTRODUODENOSCOPY (EGD);  Surgeon: [name], MD;  Location: [facility] ENDOSCOPY;  Service: Endoscopy;  Laterality: N/A;  . LEFT HEART CATH AND CORONARY ANGIOGRAPHY Left 08/29/2018   Procedure: LEFT HEART CATH AND CORONARY ANGIOGRAPHY;  Surgeon: Dionisio David, MD;  Location: [place] CV LAB;  Service: Cardiovascular;  Laterality: Left;  Marland Kitchen MASTECTOMY Right 2014   BREAST CA  . PORTA CATH INSERTION    . SPHINCTEROTOMY N/A 05/09/2018   Procedure: SPHINCTEROTOMY;  Surgeon: Jules Husbands, MD;  Location: [facility] ORS;  Service: General;  Laterality: N/A;  . UPPER GI ENDOSCOPY  09/15/2010     Completed by Verdie Shire, M.D. for nausea. Normal exam reported.  Marland Kitchen VASCULAR SURGERY      Family History  Problem Relation Age of Onset  . Breast cancer Mother 27  . Cancer Father        colon    Social History:  reports that she has quit smoking. She quit after 20.00 years of  use. She has never used smokeless tobacco. She reports that she does not drink alcohol or use drugs.  Allergies: No Known Allergies  Medications reviewed.    ROS Full ROS performed and is otherwise negative other than what is stated in HPI   BP 138/74   Pulse 70   Temp 97.8 F (36.6 C) (Skin)   Ht 5\' 9"  (1.753 m)   Wt 200 lb (90.7 kg)   SpO2 98%   BMI 29.53 kg/m   Physical Exam Vitals signs and nursing note reviewed. Exam conducted with a chaperone present.  Abdominal:     General: Abdomen is flat. There is no distension.     Palpations: There is no mass.     Tenderness: There is no abdominal tenderness. There is no guarding.  Genitourinary:    Comments: Small minimal erosion  on post midline c/w helaing fissure, there is a left posterolateral grade III hemorrhoid, no evidence of prolapse Neurological:     General: No focal deficit present.     Mental Status: She is oriented to person, place, and time.  Psychiatric:        Mood and Affect: Mood normal.        Behavior: Behavior normal.        Thought Content: Thought content normal.        Judgment: Judgment normal.        Assessment/Plan:  1. Grade III hemorrhoids.  I would not do any surgical intervention at this time.  We will continue fiber, sitz bath and nifedipine cream.  RTC as needed     Greater than 50% of the 15 minutes  visit was spent in counseling/coordination of care   Caroleen Hamman, MD [place] Surgeon

## 2018-10-17 ENCOUNTER — Other Ambulatory Visit: Payer: Medicare Other

## 2018-11-10 ENCOUNTER — Other Ambulatory Visit: Payer: Self-pay | Admitting: Oncology

## 2018-11-15 ENCOUNTER — Other Ambulatory Visit: Payer: Self-pay | Admitting: Oncology

## 2018-12-11 ENCOUNTER — Other Ambulatory Visit: Payer: Medicare Other

## 2018-12-27 ENCOUNTER — Other Ambulatory Visit: Payer: Self-pay

## 2018-12-27 ENCOUNTER — Ambulatory Visit
Admission: RE | Admit: 2018-12-27 | Discharge: 2018-12-27 | Disposition: A | Discharge status: Home / Self Care | Payer: Medicare Other | Source: Ambulatory Visit | Attending: Oncology | Admitting: Oncology

## 2018-12-27 ENCOUNTER — Ambulatory Visit
Admission: RE | Admit: 2018-12-27 | Discharge: 2018-12-27 | Disposition: A | Discharge status: Home / Self Care | Payer: Medicare Other | Source: Ambulatory Visit | Attending: Nurse Practitioner | Admitting: Nurse Practitioner

## 2018-12-27 DIAGNOSIS — Z13820 Encounter for screening for osteoporosis: Secondary | ICD-10-CM | POA: Diagnosis not present

## 2018-12-27 DIAGNOSIS — Z1231 Encounter for screening mammogram for malignant neoplasm of breast: Secondary | ICD-10-CM

## 2018-12-27 DIAGNOSIS — M810 Age-related osteoporosis without current pathological fracture: Secondary | ICD-10-CM | POA: Diagnosis not present

## 2018-12-27 DIAGNOSIS — C50311 Malignant neoplasm of lower-inner quadrant of right female breast: Secondary | ICD-10-CM | POA: Diagnosis not present

## 2018-12-27 DIAGNOSIS — Z170 Estrogen receptor positive status [ER+]: Secondary | ICD-10-CM | POA: Diagnosis present

## 2019-01-11 ENCOUNTER — Other Ambulatory Visit: Payer: Self-pay | Admitting: Family Medicine

## 2019-01-11 DIAGNOSIS — Z20822 Contact with and (suspected) exposure to covid-19: Secondary | ICD-10-CM

## 2019-01-17 LAB — NOVEL CORONAVIRUS, NAA: SARS-CoV-2, NAA: NOT DETECTED

## 2019-02-26 ENCOUNTER — Other Ambulatory Visit: Payer: Self-pay | Admitting: Oncology

## 2019-03-06 ENCOUNTER — Other Ambulatory Visit: Payer: Self-pay | Admitting: Oncology

## 2019-03-07 NOTE — Progress Notes (Signed)
[place]  [phone] [phone]) [phone]  ID: Abigail Ortega OB: 03/06/1951  MR#: [id number]  CV:[id number]  Patient Care Team: Perrin Maltese, MD as PCP - General (Internal Medicine) Bary Castilla, Forest Gleason, MD (General Surgery) Sharene Butters, MD (General Surgery)  CHIEF COMPLAINT: Pathologic stage IIIa triple positive invasive carcinoma of the lower inner quadrant of the right breast.   INTERVAL HISTORY: Patient returns to clinic today for routine 10-month evaluation.  She currently feels well and is asymptomatic.  She continues to tolerate letrozole without significant side effects.  She has no neurologic complaints. She denies any recent fevers or illnesses.  She denies any chest pain, shortness of breath, cough, or hemoptysis.  She denies any nausea, vomiting, constipation, or diarrhea. She has no urinary complaints.  Patient offers no specific complaints today.  REVIEW OF SYSTEMS:   Review of Systems  Constitutional: Negative.  Negative for chills, fever, malaise/fatigue and weight loss.  HENT: Negative.  Negative for tinnitus.   Eyes: Negative.   Respiratory: Negative.  Negative for cough and sputum production.   Cardiovascular: Negative.  Negative for chest pain and leg swelling.  Gastrointestinal: Negative.  Negative for abdominal pain, constipation, diarrhea, nausea and vomiting.  Genitourinary: Negative.  Negative for dysuria.  Musculoskeletal: Negative.  Negative for myalgias.  Skin: Negative.  Negative for rash.  Neurological: Negative.  Negative for sensory change, weakness and headaches.  Psychiatric/Behavioral: Negative.  The patient is not nervous/anxious and does not have insomnia.     As per HPI. Otherwise, a complete review of systems is negative.  PAST MEDICAL HISTORY: Past Medical History:  Diagnosis Date  . Anxiety   . Arthritis   . Breast cancer [facility]) 2014   Right breast cancer - chemo, radiation and Mastectomy  . CHF  (congestive heart failure) ([place]) 2013  . Chronic kidney disease   . Clotting disorder ([place])    blood clots  . Depression   . GERD (gastroesophageal reflux disease)   . GI bleed   . H/O heart artery stent   . Headache   . Heart attack [facility]) 2012   coronary stent x2 completed by Dr. Clayborn Bigness.  Marland Kitchen Heart attack ([place]) Apr 29, 2013  . Hemorrhoids   . Hypertension   . Hypothyroidism   . IBS (irritable bowel syndrome)   . Malignant neoplasm of lower-inner quadrant of female breast [facility]) January 20, 2013.   invasive mammary cancer, minimal 0.85 cm.  Histologic grade 1.  . Personal history of chemotherapy 2014   BREAST CA  . Personal history of radiation therapy 2014   BREAST CA  . Thyroid disease     PAST SURGICAL HISTORY: Past Surgical History:  Procedure Laterality Date  . BOTOX INJECTION N/A 03/14/2017   Procedure: BOTOX INJECTION;  Surgeon: Jules Husbands, MD;  Location: [facility] ORS;  Service: General;  Laterality: N/A;  . BOTOX INJECTION N/A 05/09/2018   Procedure: BOTOX INJECTION;  Surgeon: Jules Husbands, MD;  Location: [facility] ORS;  Service: General;  Laterality: N/A;  . BREAST BIOPSY Right 2014   positive  . BREAST SURGERY Right 02-21-2013   right mastectomy  . CARDIAC CATHETERIZATION    . COLONOSCOPY W/ BIOPSIES  09/15/2010   Colonoscopy completed by Verdie Shire, M.D. Tubular adenoma of the ascending colon and descending colon reported up to 0.4 cm in diameter. No atypia.  . COLONOSCOPY WITH PROPOFOL N/A 12/23/2016   Procedure: COLONOSCOPY WITH PROPOFOL;  Surgeon: Wilford Corner, MD;  Location: [facility]  ENDOSCOPY;  Service: Endoscopy;  Laterality: N/A;  . ESOPHAGOGASTRODUODENOSCOPY N/A 12/13/2016   Procedure: ESOPHAGOGASTRODUODENOSCOPY (EGD);  Surgeon: [name], MD;  Location: [facility] ENDOSCOPY;  Service: Endoscopy;  Laterality: N/A;  . LEFT HEART CATH AND CORONARY ANGIOGRAPHY Left 08/29/2018   Procedure: LEFT HEART CATH AND CORONARY ANGIOGRAPHY;  Surgeon: Dionisio David, MD;  Location: [place] CV LAB;  Service: Cardiovascular;  Laterality: Left;  Marland Kitchen MASTECTOMY Right 2014   BREAST CA  . PORTA CATH INSERTION    . SPHINCTEROTOMY N/A 05/09/2018   Procedure: SPHINCTEROTOMY;  Surgeon: Jules Husbands, MD;  Location: [facility] ORS;  Service: General;  Laterality: N/A;  . UPPER GI ENDOSCOPY  09/15/2010     Completed by Verdie Shire, M.D. for nausea. Normal exam reported.  Marland Kitchen VASCULAR SURGERY      FAMILY HISTORY Family History  Problem Relation Age of Onset  . Breast cancer Mother 35  . Cancer Father        colon       ADVANCED DIRECTIVES:    HEALTH MAINTENANCE: Social History   Tobacco Use  . Smoking status: Former Smoker    Years: 20.00  . Smokeless tobacco: Never Used  Substance Use Topics  . Alcohol use: No  . Drug use: No     Colonoscopy:  PAP:  Bone density:  Lipid panel:  No Known Allergies  Current Outpatient Medications  Medication Sig Dispense Refill  . acetaminophen (TYLENOL 8 HOUR ARTHRITIS PAIN) 650 MG CR tablet Take 1,300 mg by mouth every 8 (eight) hours as needed for pain.     Marland Kitchen alendronate (FOSAMAX) 70 MG tablet TAKE 1 TABLET BY MOUTH ONCE A WEEK. TAKE WITH A FULL GLASS OF WATER ON AN EMPTY STOMACH. 12 tablet 3  . ALPRAZolam (XANAX) 0.25 MG tablet Take 0.25 mg by mouth 2 (two) times daily as needed for anxiety.   2  . atorvastatin (LIPITOR) 40 MG tablet Take 40 mg by mouth daily.     Marland Kitchen azelastine (ASTELIN) 0.1 % nasal spray Place 1 spray into both nostrils 2 (two) times daily as needed for rhinitis.   4  . clopidogrel (PLAVIX) 75 MG tablet Take 75 mg by mouth daily.     . [employer] ALLERGY RELIEF 4 MG tablet Take 4 mg by mouth every 12 (twelve) hours as needed for allergies.   3  . fexofenadine (ALLEGRA) 60 MG tablet Take 30 mg by mouth 2 (two) times daily as needed (allergies.).     Marland Kitchen gabapentin (NEURONTIN) 300 MG capsule Take 1 capsule (300 mg total) by mouth 3 (three) times daily. 90 capsule 0  . hydrALAZINE (APRESOLINE) 25 MG tablet Take 25 mg by  mouth 3 (three) times daily.    . hydrocortisone (ANUSOL-HC) 2.5 % rectal cream Place 1 application rectally 2 (two) times daily. 30 g 1  . isosorbide mononitrate (IMDUR) 30 MG 24 hr tablet Take 30 mg by mouth daily with breakfast.    . letrozole (FEMARA) 2.5 MG tablet Take 1 tablet (2.5 mg total) by mouth daily. 90 tablet 3  . levothyroxine (SYNTHROID, LEVOTHROID) 88 MCG tablet Take 88 mcg by mouth daily before breakfast.     . meclizine (ANTIVERT) 25 MG tablet Take 2 tablets (50 mg total) by mouth 3 (three) times daily as needed for dizziness or nausea. 30 tablet 1  . montelukast (SINGULAIR) 10 MG tablet Take 10 mg by mouth at bedtime.    Marland Kitchen nystatin (MYCOSTATIN/NYSTOP) powder Apply 1 g topically 3 (  three) times daily as needed (for rash).   1  . Olopatadine HCl 0.2 % SOLN Place 1 drop into both eyes daily as needed (for allergies).   2  . pantoprazole (PROTONIX) 40 MG tablet TAKE 1 TABLET BY MOUTH  DAILY (Patient taking differently: Take 40 mg by mouth daily. ) 90 tablet 1  . promethazine (PHENERGAN) 12.5 MG tablet Take 12.5 mg by mouth every 6 (six) hours as needed for nausea or vomiting.    . ranolazine (RANEXA) 1000 MG SR tablet Take 500 mg by mouth 2 (two) times daily.    . temazepam (RESTORIL) 30 MG capsule Take 30 mg by mouth at bedtime.     . traZODone (DESYREL) 50 MG tablet Take 50 mg by mouth at bedtime.      No current facility-administered medications for this visit.     OBJECTIVE: Vitals:   03/11/19 1434  BP: 117/64  Pulse: 77  Resp: 18  Temp: 98.4 F (36.9 C)     Body mass index is 29.79 kg/m.    ECOG FS:0 - Asymptomatic  General: Well-developed, well-nourished, no acute distress. Eyes: Pink conjunctiva, anicteric sclera. HEENT: Normocephalic, moist mucous membranes. Breasts: Patient declined exam today. Lungs: Clear to auscultation bilaterally. Heart: Regular rate and rhythm. No rubs, murmurs, or gallops. Abdomen: Soft, nontender, nondistended. No organomegaly  noted, normoactive bowel sounds. Musculoskeletal: No edema, cyanosis, or clubbing. Neuro: Alert, answering all questions appropriately. Cranial nerves grossly intact. Skin: No rashes or petechiae noted. Psych: Normal affect.  LAB RESULTS:  Lab Results  Component Value Date   NA 139 05/02/2018   K 4.3 05/02/2018   CL 101 05/02/2018   CO2 30 05/02/2018   GLUCOSE 112 (H) 05/02/2018   BUN 17 05/02/2018   CREATININE 1.43 (H) 05/02/2018   CALCIUM 10.0 05/02/2018   PROT 6.0 (L) 12/22/2016   ALBUMIN 3.3 (L) 12/22/2016   AST 18 12/22/2016   ALT 15 12/22/2016   ALKPHOS 76 12/22/2016   BILITOT 0.8 12/22/2016   GFRNONAA 37 (L) 05/02/2018   GFRAA 43 (L) 05/02/2018    Lab Results  Component Value Date   WBC 6.1 05/02/2018   NEUTROABS 5.1 11/02/2016   HGB 11.2 (L) 05/02/2018   HCT 35.2 (L) 05/02/2018   MCV 93.9 05/02/2018   PLT 391 05/02/2018     STUDIES: No results found.  ASSESSMENT: Pathologic stage IIIa triple positive invasive carcinoma of the lower inner quadrant of the right breast.   PLAN:    1. Pathologic stage IIIa triple positive invasive carcinoma of the lower inner quadrant of the right breast: Patient has now completed all of her treatments including XRT. She only completed 14 of 18 infusions of Herceptin secondary to persistent nausea. Her CA 27.29 continues to be within normal limits.  Patient initiated letrozole in October 2015, but given her high stage and high risk of recurrence she has agreed to continue letrozole for a minimum of 7 years completing in October 2022.  Her most recent mammogram on December 27, 2018 was reported as BI-RADS 1.  Repeat in June 2021.  Return to clinic in 6 months for routine evaluation. 2. Osteoporosis: Bone mineral density completed on December 27, 2018 reported T score of -2.7 which is essentially unchanged from 1 year prior.  Continue Fosamax, calcium, and vitamin D.  Repeat in June 2021. 3.  Anal fissure: Resolved.  Patient expressed  understanding and was in agreement with this plan. She also understands that She can call clinic at  any time with any questions, concerns, or complaints.   Breast cancer of lower-inner quadrant of right female breast   Staging form: Breast, AJCC 7th Edition     Pathologic stage from 02/24/2015: Stage IIIA (T1c, N2b, cM0) - Signed by Lloyd Huger, MD on 02/24/2015   Lloyd Huger, MD 03/12/19 7:23 AM

## 2019-03-11 ENCOUNTER — Inpatient Hospital Stay: Payer: Medicare Other | Attending: Oncology | Admitting: Oncology

## 2019-03-11 ENCOUNTER — Other Ambulatory Visit: Payer: Self-pay

## 2019-03-11 ENCOUNTER — Encounter: Payer: Self-pay | Admitting: Oncology

## 2019-03-11 VITALS — BP 117/64 | HR 77 | Temp 98.4°F | Resp 18 | Wt 201.7 lb

## 2019-03-11 DIAGNOSIS — C50311 Malignant neoplasm of lower-inner quadrant of right female breast: Secondary | ICD-10-CM

## 2019-03-11 DIAGNOSIS — Z87891 Personal history of nicotine dependence: Secondary | ICD-10-CM | POA: Insufficient documentation

## 2019-03-11 DIAGNOSIS — Z170 Estrogen receptor positive status [ER+]: Secondary | ICD-10-CM

## 2019-03-11 DIAGNOSIS — Z803 Family history of malignant neoplasm of breast: Secondary | ICD-10-CM | POA: Insufficient documentation

## 2019-03-11 DIAGNOSIS — N189 Chronic kidney disease, unspecified: Secondary | ICD-10-CM | POA: Insufficient documentation

## 2019-03-11 DIAGNOSIS — Z79811 Long term (current) use of aromatase inhibitors: Secondary | ICD-10-CM | POA: Insufficient documentation

## 2019-03-11 DIAGNOSIS — E039 Hypothyroidism, unspecified: Secondary | ICD-10-CM | POA: Insufficient documentation

## 2019-03-11 DIAGNOSIS — I130 Hypertensive heart and chronic kidney disease with heart failure and stage 1 through stage 4 chronic kidney disease, or unspecified chronic kidney disease: Secondary | ICD-10-CM | POA: Diagnosis not present

## 2019-03-11 MED ORDER — LETROZOLE 2.5 MG PO TABS: 2.5000 mg | ORAL_TABLET | Freq: Every day | ORAL | 3 refills | Status: DC

## 2019-03-11 NOTE — Progress Notes (Signed)
Pt in for follow up, reports changed heart doctors to Dr Humphrey Rolls.

## 2019-04-07 ENCOUNTER — Ambulatory Visit (INDEPENDENT_AMBULATORY_CARE_PROVIDER_SITE_OTHER): Payer: Medicare Other | Admitting: Surgery

## 2019-04-07 ENCOUNTER — Encounter: Payer: Self-pay | Admitting: Surgery

## 2019-04-07 ENCOUNTER — Other Ambulatory Visit: Payer: Self-pay

## 2019-04-07 VITALS — BP 134/84 | HR 85 | Temp 97.8°F | Ht 65.0 in | Wt 200.0 lb

## 2019-04-07 DIAGNOSIS — K6289 Other specified diseases of anus and rectum: Secondary | ICD-10-CM

## 2019-04-07 MED ORDER — HYDROCORTISONE ACETATE 25 MG RE SUPP: 25.0000 mg | RECTAL | 1 refills | Status: DC | PRN

## 2019-04-07 NOTE — Patient Instructions (Signed)
You may use suppositories as needed for comfort.  Follow up here with Dr [name] in 3 weeks.

## 2019-04-08 ENCOUNTER — Encounter: Payer: Self-pay | Admitting: Surgery

## 2019-04-08 NOTE — Progress Notes (Signed)
Outpatient Surgical Follow Up  04/08/2019  Abigail Ortega is an 68 y.o. female.   Chief Complaint  Patient presents with  . Follow-up    hemorrhoid    HPI: Mr. Abigail Ortega is a very nice 68 year old female known to me with a prior history of anal fissure status post Botox.  She now comes with some anorectal discomfort.  She states that she feels that she cannot evacuate her bowels completely.  She feels a pouch.  No hematochezia no melena.  No fevers no chills.  She states that this is completely different from her fissure  Past Medical History:  Diagnosis Date  . Anxiety   . Arthritis   . Breast cancer [facility]) 2014   Right breast cancer - chemo, radiation and Mastectomy  . CHF (congestive heart failure) ([place]) 2013  . Chronic kidney disease   . Clotting disorder ([place])    blood clots  . Depression   . GERD (gastroesophageal reflux disease)   . GI bleed   . H/O heart artery stent   . Headache   . Heart attack [facility]) 2012   coronary stent x2 completed by Dr. Clayborn Bigness.  Marland Kitchen Heart attack ([place]) Apr 29, 2013  . Hemorrhoids   . Hypertension   . Hypothyroidism   . IBS (irritable bowel syndrome)   . Malignant neoplasm of lower-inner quadrant of female breast [facility]) January 20, 2013.   invasive mammary cancer, minimal 0.85 cm.  Histologic grade 1.  . Personal history of chemotherapy 2014   BREAST CA  . Personal history of radiation therapy 2014   BREAST CA  . Thyroid disease     Past Surgical History:  Procedure Laterality Date  . BOTOX INJECTION N/A 03/14/2017   Procedure: BOTOX INJECTION;  Surgeon: Jules Husbands, MD;  Location: [facility] ORS;  Service: General;  Laterality: N/A;  . BOTOX INJECTION N/A 05/09/2018   Procedure: BOTOX INJECTION;  Surgeon: Jules Husbands, MD;  Location: [facility] ORS;  Service: General;  Laterality: N/A;  . BREAST BIOPSY Right 2014   positive  . BREAST SURGERY Right 02-21-2013   right mastectomy  . CARDIAC CATHETERIZATION    . COLONOSCOPY W/ BIOPSIES  09/15/2010    Colonoscopy completed by Verdie Shire, M.D. Tubular adenoma of the ascending colon and descending colon reported up to 0.4 cm in diameter. No atypia.  . COLONOSCOPY WITH PROPOFOL N/A 12/23/2016   Procedure: COLONOSCOPY WITH PROPOFOL;  Surgeon: Wilford Corner, MD;  Location: [facility] ENDOSCOPY;  Service: Endoscopy;  Laterality: N/A;  . ESOPHAGOGASTRODUODENOSCOPY N/A 12/13/2016   Procedure: ESOPHAGOGASTRODUODENOSCOPY (EGD);  Surgeon: [name], MD;  Location: [facility] ENDOSCOPY;  Service: Endoscopy;  Laterality: N/A;  . LEFT HEART CATH AND CORONARY ANGIOGRAPHY Left 08/29/2018   Procedure: LEFT HEART CATH AND CORONARY ANGIOGRAPHY;  Surgeon: Dionisio David, MD;  Location: [place] CV LAB;  Service: Cardiovascular;  Laterality: Left;  Marland Kitchen MASTECTOMY Right 2014   BREAST CA  . PORTA CATH INSERTION    . SPHINCTEROTOMY N/A 05/09/2018   Procedure: SPHINCTEROTOMY;  Surgeon: Jules Husbands, MD;  Location: [facility] ORS;  Service: General;  Laterality: N/A;  . UPPER GI ENDOSCOPY  09/15/2010     Completed by Verdie Shire, M.D. for nausea. Normal exam reported.  Marland Kitchen VASCULAR SURGERY      Family History  Problem Relation Age of Onset  . Breast cancer Mother 37  . Cancer Father        colon    Social History:  reports that she has  quit smoking. She quit after 20.00 years of use. She has never used smokeless tobacco. She reports that she does not drink alcohol or use drugs.  Allergies: No Known Allergies  Medications reviewed.    ROS Full ROS performed and is otherwise negative other than what is stated in HPI   BP 134/84   Pulse 85   Temp 97.8 F (36.6 C) (Skin)   Ht 5\' 5"  (1.651 m)   Wt 200 lb (90.7 kg)   SpO2 98%   BMI 33.28 kg/m   Physical Exam Vitals signs and nursing note reviewed. Exam conducted with a chaperone present.  Constitutional:      General: She is not in acute distress.    Appearance: Normal appearance. She is normal weight.  Pulmonary:     Effort: Pulmonary effort is normal. No  respiratory distress.  Abdominal:     General: Abdomen is flat. There is no distension.     Palpations: There is no mass.     Tenderness: There is no abdominal tenderness.     Hernia: No hernia is present.  Genitourinary:    Comments: RECTAL : no anal fissure, no hemorrhoids, no masses, some erythema anal mucosa Skin:    General: Skin is warm and dry.     Capillary Refill: Capillary refill takes less than 2 seconds.  Neurological:     General: No focal deficit present.     Mental Status: She is alert and oriented to person, place, and time.  Psychiatric:        Mood and Affect: Mood normal.        Behavior: Behavior normal.        Thought Content: Thought content normal.        Judgment: Judgment normal.    Assessment/Plan: Ano rectal discomfort , resolved fissure and no evidence of hemorrhoids. We will prescribe Anusol suppositories to decrease some anal inflammation. No masses, no need for surgical intervention. Greater than 50% of the 25 minutes  visit was spent in counseling/coordination of care   Caroleen Hamman, MD [place] Surgeon

## 2019-04-28 ENCOUNTER — Other Ambulatory Visit: Payer: Self-pay

## 2019-04-28 ENCOUNTER — Encounter: Payer: Self-pay | Admitting: Surgery

## 2019-04-28 ENCOUNTER — Ambulatory Visit (INDEPENDENT_AMBULATORY_CARE_PROVIDER_SITE_OTHER): Payer: Medicare Other | Admitting: Surgery

## 2019-04-28 VITALS — BP 118/74 | HR 79 | Temp 97.2°F | Resp 14 | Ht 68.0 in | Wt 194.8 lb

## 2019-04-28 DIAGNOSIS — N812 Incomplete uterovaginal prolapse: Secondary | ICD-10-CM | POA: Diagnosis not present

## 2019-04-28 NOTE — Patient Instructions (Signed)
We sent the referral to Dr. Glennon Mac at [facility]. Someone from his office will call to schedule an appointment within 7-10 days. If you have not heard from anyone within the time frame call our office so we can check on this for you.

## 2019-05-05 ENCOUNTER — Encounter: Payer: Self-pay | Admitting: Surgery

## 2019-05-05 NOTE — Progress Notes (Signed)
Surgical Consultation  05/05/2019  Abigail Ortega is an 68 y.o. female.   Chief Complaint  Patient presents with  . Follow-up    rectal pain     HPI: Ms. Abigail Ortega is a 68 year old female well-known to me with a history of anal fissure.  Now she comes for persistent discomfort or in the perianal and perineal area.  There is discomfort on pain occurs after having a bowel movement and when she strains.  She also feels heavy protrusion within her vaginal wall.  Past Medical History:  Diagnosis Date  . Anxiety   . Arthritis   . Breast cancer [facility] [address]) 2014   Right breast cancer - chemo, radiation and Mastectomy  . CHF (congestive heart failure) ([place]) 2013  . Chronic kidney disease   . Clotting disorder ([place])    blood clots  . Depression   . GERD (gastroesophageal reflux disease)   . GI bleed   . H/O heart artery stent   . Headache   . Heart attack [facility]) 2012   coronary stent x2 completed by Dr. Clayborn Bigness.  Marland Kitchen Heart attack ([place]) Apr 29, 2013  . Hemorrhoids   . Hypertension   . Hypothyroidism   . IBS (irritable bowel syndrome)   . Malignant neoplasm of lower-inner quadrant of female breast [facility]) January 20, 2013.   invasive mammary cancer, minimal 0.85 cm.  Histologic grade 1.  . Personal history of chemotherapy 2014   BREAST CA  . Personal history of radiation therapy 2014   BREAST CA  . Thyroid disease     Past Surgical History:  Procedure Laterality Date  . BOTOX INJECTION N/A 03/14/2017   Procedure: BOTOX INJECTION;  Surgeon: Jules Husbands, MD;  Location: [facility] ORS;  Service: General;  Laterality: N/A;  . BOTOX INJECTION N/A 05/09/2018   Procedure: BOTOX INJECTION;  Surgeon: Jules Husbands, MD;  Location: [facility] ORS;  Service: General;  Laterality: N/A;  . BREAST BIOPSY Right 2014   positive  . BREAST SURGERY Right 02-21-2013   right mastectomy  . CARDIAC CATHETERIZATION    . COLONOSCOPY W/ BIOPSIES  09/15/2010   Colonoscopy completed by Verdie Shire, M.D. Tubular adenoma of  the ascending colon and descending colon reported up to 0.4 cm in diameter. No atypia.  . COLONOSCOPY WITH PROPOFOL N/A 12/23/2016   Procedure: COLONOSCOPY WITH PROPOFOL;  Surgeon: Wilford Corner, MD;  Location: [facility] ENDOSCOPY;  Service: Endoscopy;  Laterality: N/A;  . ESOPHAGOGASTRODUODENOSCOPY N/A 12/13/2016   Procedure: ESOPHAGOGASTRODUODENOSCOPY (EGD);  Surgeon: [name], MD;  Location: [facility] ENDOSCOPY;  Service: Endoscopy;  Laterality: N/A;  . LEFT HEART CATH AND CORONARY ANGIOGRAPHY Left 08/29/2018   Procedure: LEFT HEART CATH AND CORONARY ANGIOGRAPHY;  Surgeon: Dionisio David, MD;  Location: [place] CV LAB;  Service: Cardiovascular;  Laterality: Left;  Marland Kitchen MASTECTOMY Right 2014   BREAST CA  . PORTA CATH INSERTION    . SPHINCTEROTOMY N/A 05/09/2018   Procedure: SPHINCTEROTOMY;  Surgeon: Jules Husbands, MD;  Location: [facility] ORS;  Service: General;  Laterality: N/A;  . UPPER GI ENDOSCOPY  09/15/2010     Completed by Verdie Shire, M.D. for nausea. Normal exam reported.  Marland Kitchen VASCULAR SURGERY      Family History  Problem Relation Age of Onset  . Breast cancer Mother 57  . Cancer Father        colon    Social History:  reports that she has quit smoking. She quit after 20.00 years of use. She has never used  smokeless tobacco. She reports that she does not drink alcohol or use drugs.  Allergies: No Known Allergies  Medications reviewed.     ROS Full ROS performed and is otherwise negative other than what is stated in the HPI    BP 118/74   Pulse 79   Temp (!) 97.2 F (36.2 C) (Temporal)   Resp 14   Ht 5\' 8"  (1.727 m)   Wt 194 lb 12.8 oz (88.4 kg)   SpO2 95%   BMI 29.62 kg/m   Physical Exam Vitals signs and nursing note reviewed. Exam conducted with a chaperone present.  Constitutional:      Appearance: Normal appearance. She is normal weight. She is not ill-appearing.  Pulmonary:     Effort: Pulmonary effort is normal. No respiratory distress.     Breath sounds: No  stridor.  Abdominal:     General: Abdomen is flat. There is no distension.     Palpations: There is no mass.     Tenderness: There is no abdominal tenderness. There is no guarding.     Hernia: No hernia is present.  Genitourinary:    General: Normal vulva.     Comments: Vaginal exam revealed evidence of cervical prolapse.  Up on vaginal palpation the patient experienced and replicates the same discomfort.  On a rectal exam there is evidence of a good sphincter tone without any definitive masses or fissures. Musculoskeletal: Normal range of motion.        General: No swelling or tenderness.  Skin:    General: Skin is warm and dry.     Capillary Refill: Capillary refill takes less than 2 seconds.  Neurological:     General: No focal deficit present.     Mental Status: She is alert and oriented to person, place, and time.  Psychiatric:        Mood and Affect: Mood normal.        Behavior: Behavior normal.        Thought Content: Thought content normal.        Judgment: Judgment normal.      Assessment/Plan: 68 year old female now with cervical prolapse.  From a rectal perspective she is improved.  And discussed with the patient about the need for referral to gynecology. Please  Note that I spent 25 minutes in this encounter with greater than 50% spent in coordination and counseling of her care    Caroleen Hamman, MD [place] Surgeon

## 2019-05-26 ENCOUNTER — Encounter: Payer: Self-pay | Admitting: Obstetrics and Gynecology

## 2019-05-26 ENCOUNTER — Other Ambulatory Visit: Payer: Self-pay

## 2019-05-26 ENCOUNTER — Ambulatory Visit (INDEPENDENT_AMBULATORY_CARE_PROVIDER_SITE_OTHER): Payer: Medicare Other | Admitting: Obstetrics and Gynecology

## 2019-05-26 VITALS — BP 124/78 | Ht 67.0 in | Wt 194.0 lb

## 2019-05-26 DIAGNOSIS — R150 Incomplete defecation: Secondary | ICD-10-CM | POA: Diagnosis not present

## 2019-05-26 DIAGNOSIS — N812 Incomplete uterovaginal prolapse: Secondary | ICD-10-CM

## 2019-05-26 NOTE — Progress Notes (Signed)
Obstetrics & Gynecology Office Visit   Chief Complaint:  Chief Complaint  Patient presents with  . Referral    Possible uterovaginal prolapse   The patient is seen in referral at the request of Pabon, [place], MD from [place] for possible uterovaginal prolapse.   History of Present Illness: 68 y.o. [phone] female who is seen in referral from Dawson, [place], MD from [place] for possible uterovaginal prolapse.  The patient states that she has trouble passing stool.  She had 3 children born vaginally and 0 by c-section.  Largest baby weighed 10 lbs 0 oz.   She went through spontaneous at age late 35s early 60s.    Her current symptoms began 1 year ago off and on.  By pelvic floor systems, these include the following:  Urinary symptoms include:    She does not endorse urinary leakage.    Pelvic organ prolapse symptoms include: She does endorse symptoms of pelvic organ prolapse.  If yes to the above these symptoms include the following:  She notices a vaginal bulge outside the vaginal opening She describes this as the size as an egg  She  does reports vaginal pressure She does not reduce the bulge in order to urinate or defecate Prior treatments include:    Pessary NO  Surgery NO  Colorectal symptoms include: She does endorse anal incontinence, constipation, and obstructed defecation.    If yes to the above then these symptoms include the following:   Anal incontinence  Of solid stool     Occurs daily   It is not associated with an urge to defecate  She does experience insensible loss of stool  She uses zero pads per day for this problem.    Constipation  She reports having a BM every day  Stool consistency is soft   To manage her constipation symptoms she uses: Maalox She does have a history of IBS She does not have a history of IBD  She does not have a history of radiation   Obstructed defecation:  She does report  incomplete rectal evacuation  She does not need to manually assist defecation (splinting)  Rectal prolapse symptoms are absent  Rectal bleeding / hematochezia is absent  Sexual function: Patient is not sexually active.     She does not have sexual dysfunction complaints, as she is not sexually active.      Health Care Maintenance: Last colorectal cancer screening: recent Last mammogram: 12/2018 Last pap smear: years ago (older than 68 years old)  Past Medical History:  Diagnosis Date  . Anxiety   . Arthritis   . Breast cancer [facility]) 2014   Right breast cancer - chemo, radiation and Mastectomy  . CHF (congestive heart failure) ([place]) 2013  . Chronic kidney disease   . Clotting disorder ([place])    blood clots  . Depression   . GERD (gastroesophageal reflux disease)   . GI bleed   . H/O heart artery stent   . Headache   . Heart attack [facility]) 2012   coronary stent x2 completed by Dr. Clayborn Bigness.  Marland Kitchen Heart attack ([place]) Apr 29, 2013  . Hemorrhoids   . Hypertension   . Hypothyroidism   . IBS (irritable bowel syndrome)   . Malignant neoplasm of lower-inner quadrant of female breast [facility]) January 20, 2013.   invasive mammary cancer, minimal 0.85 cm.  Histologic grade 1.  . Personal history of chemotherapy 2014   BREAST CA  . Personal  history of radiation therapy 2014   BREAST CA  . Thyroid disease     Past Surgical History:  Procedure Laterality Date  . BOTOX INJECTION N/A 03/14/2017   Procedure: BOTOX INJECTION;  Surgeon: Jules Husbands, MD;  Location: [facility] ORS;  Service: General;  Laterality: N/A;  . BOTOX INJECTION N/A 05/09/2018   Procedure: BOTOX INJECTION;  Surgeon: Jules Husbands, MD;  Location: [facility] ORS;  Service: General;  Laterality: N/A;  . BREAST BIOPSY Right 2014   positive  . BREAST SURGERY Right 02-21-2013   right mastectomy  . CARDIAC CATHETERIZATION    . COLONOSCOPY W/ BIOPSIES  09/15/2010   Colonoscopy completed by Verdie Shire, M.D. Tubular adenoma of the ascending  colon and descending colon reported up to 0.4 cm in diameter. No atypia.  . COLONOSCOPY WITH PROPOFOL N/A 12/23/2016   Procedure: COLONOSCOPY WITH PROPOFOL;  Surgeon: Wilford Corner, MD;  Location: [facility] ENDOSCOPY;  Service: Endoscopy;  Laterality: N/A;  . ESOPHAGOGASTRODUODENOSCOPY N/A 12/13/2016   Procedure: ESOPHAGOGASTRODUODENOSCOPY (EGD);  Surgeon: [name], MD;  Location: [facility] ENDOSCOPY;  Service: Endoscopy;  Laterality: N/A;  . LEFT HEART CATH AND CORONARY ANGIOGRAPHY Left 08/29/2018   Procedure: LEFT HEART CATH AND CORONARY ANGIOGRAPHY;  Surgeon: Dionisio David, MD;  Location: [facility];  Service: Cardiovascular;  Laterality: Left;  Marland Kitchen MASTECTOMY Right 2014   BREAST CA  . PORTA CATH INSERTION    . SPHINCTEROTOMY N/A 05/09/2018   Procedure: SPHINCTEROTOMY;  Surgeon: Jules Husbands, MD;  Location: [facility] ORS;  Service: General;  Laterality: N/A;  . UPPER GI ENDOSCOPY  09/15/2010     Completed by Verdie Shire, M.D. for nausea. Normal exam reported.  Marland Kitchen VASCULAR SURGERY      Gynecologic History: No LMP recorded. Patient is postmenopausal.  Obstetric HistoryCJ:[id number]  Family History  Problem Relation Age of Onset  . Breast cancer Mother 33  . Cancer Father        colon    Social History   Socioeconomic History  . Marital status: Divorced    Spouse name: Not on file  . Number of children: Not on file  . Years of education: Not on file  . Highest education level: Not on file  Occupational History  . Not on file  Social Needs  . Financial resource strain: Not on file  . Food insecurity    Worry: Not on file    Inability: Not on file  . Transportation needs    Medical: Not on file    Non-medical: Not on file  Tobacco Use  . Smoking status: Former Smoker    Years: 20.00  . Smokeless tobacco: Never Used  Substance and Sexual Activity  . Alcohol use: No  . Drug use: No  . Sexual activity: Never  Lifestyle  . Physical activity    Days per week: Not on file     Minutes per session: Not on file  . Stress: Not on file  Relationships  . Social Herbalist on phone: Not on file    Gets together: Not on file    Attends religious service: Not on file    Active member of club or organization: Not on file    Attends meetings of clubs or organizations: Not on file    Relationship status: Not on file  . Intimate partner violence    Fear of current or ex partner: Not on file    Emotionally abused: Not on file  Physically abused: Not on file    Forced sexual activity: Not on file  Other Topics Concern  . Not on file  Social History Narrative  . Not on file    No Known Allergies  Prior to Admission medications   Medication Sig Start Date End Date Taking? Authorizing Provider  acetaminophen (TYLENOL 8 HOUR ARTHRITIS PAIN) 650 MG CR tablet Take 1,300 mg by mouth every 8 (eight) hours as needed for pain.     [provider]  alendronate (FOSAMAX) 70 MG tablet TAKE 1 TABLET BY MOUTH ONCE A WEEK. TAKE WITH A FULL GLASS OF WATER ON AN EMPTY STOMACH. 11/15/18   Lloyd Huger, MD  ALPRAZolam Duanne Moron) 0.25 MG tablet Take 0.25 mg by mouth 2 (two) times daily as needed for anxiety.  04/23/15   [provider]  atorvastatin (LIPITOR) 40 MG tablet Take 40 mg by mouth daily.  06/08/15   [provider]  azelastine (ASTELIN) 0.1 % nasal spray Place 1 spray into both nostrils 2 (two) times daily as needed for rhinitis.  04/10/17   [provider]  clopidogrel (PLAVIX) 75 MG tablet Take 75 mg by mouth daily.     [provider]  [facility] ALLERGY RELIEF 4 MG tablet Take 4 mg by mouth every 12 (twelve) hours as needed for allergies.  05/19/18   [provider]  fexofenadine (ALLEGRA) 60 MG tablet Take 30 mg by mouth 2 (two) times daily as needed (allergies.).     [provider]  gabapentin (NEURONTIN) 300 MG capsule Take 1 capsule (300 mg total) by mouth 3 (three) times daily. 04/05/17   Pabon, [place],  MD  hydrALAZINE (APRESOLINE) 25 MG tablet Take 25 mg by mouth 3 (three) times daily.    [provider]  hydrocortisone (ANUSOL-HC) 25 MG suppository Place 1 suppository (25 mg total) rectally as needed for hemorrhoids or anal itching. 04/07/19   Pabon, Diego F, MD  isosorbide mononitrate (IMDUR) 30 MG 24 hr tablet Take 30 mg by mouth daily with breakfast.    [provider]  letrozole (FEMARA) 2.5 MG tablet Take 1 tablet (2.5 mg total) by mouth daily. 03/11/19   Lloyd Huger, MD  levothyroxine (SYNTHROID, LEVOTHROID) 88 MCG tablet Take 88 mcg by mouth daily before breakfast.  05/20/18   [provider]  Magnesium 250 MG TABS Take by mouth.    [provider]  meclizine (ANTIVERT) 25 MG tablet Take 2 tablets (50 mg total) by mouth 3 (three) times daily as needed for dizziness or nausea. 09/28/17   Carrie Mew, MD  montelukast (SINGULAIR) 10 MG tablet Take 10 mg by mouth at bedtime.    [provider]  nystatin (MYCOSTATIN/NYSTOP) powder Apply 1 g topically 3 (three) times daily as needed (for rash).  12/26/17   [provider]  Olopatadine HCl 0.2 % SOLN Place 1 drop into both eyes daily as needed (for allergies).  12/19/16   [provider]  ondansetron (ZOFRAN) 4 MG tablet  02/27/19   [provider]  pantoprazole (PROTONIX) 40 MG tablet TAKE 1 TABLET BY MOUTH  DAILY Patient taking differently: Take 40 mg by mouth daily.  05/03/17   Lloyd Huger, MD  promethazine (PHENERGAN) 12.5 MG tablet Take 12.5 mg by mouth every 6 (six) hours as needed for nausea or vomiting.    [provider]  ranolazine (RANEXA) 1000 MG SR tablet Take 500 mg by mouth 2 (two) times daily.  [provider]  temazepam (RESTORIL) 30 MG capsule Take 30 mg by mouth at bedtime.  02/04/17   [provider]  traZODone (DESYREL) 50 MG tablet Take 50 mg by mouth at bedtime.  10/05/14   [provider]    Review  of Systems  Constitutional: Negative.   HENT: Negative.   Eyes: Negative.   Respiratory: Negative.   Cardiovascular: Negative.   Gastrointestinal: Negative.   Genitourinary: Negative.   Musculoskeletal: Negative.   Skin: Negative.   Neurological: Negative.   Endo/Heme/Allergies: Positive for environmental allergies. Bruises/bleeds easily.  Psychiatric/Behavioral: Negative.      Physical Exam BP 124/78   Ht 5\' 7"  (1.702 m)   Wt 194 lb (88 kg)   BMI 30.38 kg/m  No LMP recorded. Patient is postmenopausal. Physical Exam Constitutional:      General: She is not in acute distress.    Appearance: Normal appearance. She is well-developed.  Genitourinary:     Pelvic exam was performed with patient in the lithotomy position.     Vulva, inguinal canal, urethra, bladder, vagina, uterus, right adnexa and left adnexa normal.     No posterior fourchette tenderness, injury or lesion present.     No lesions in the vagina.     Vaginal atrophy and prolapse present.     No vaginal tenderness or bleeding.     No cervical friability, lesion, bleeding or polyp.     Genitourinary Comments:  She had preserved perineal sensation and reflexes. Anorectum: Normal Perineum Anal Sphincter tone: 1 out of 4  Pelvic floor muscle strength ([place]) =     1 out of 4  Summary statement of POP-Q exam:    the anterior vaginal wall is at the level of the hymen the cuff / cervix is 4 cm inside the level of the hymen,  and the posterior vaginal wall is 1 cm inside the level of the hymen   POP-Q measurements were:     Aa: 0, Ba: 0, C: -4    gH: 4, pB: 3, TVL: 9    Ap: -1, Bp: -1, D: -5  HENT:     Head: Normocephalic and atraumatic.  Eyes:     General: No scleral icterus.    Conjunctiva/sclera: Conjunctivae normal.  Neck:     Musculoskeletal: Normal range of motion and neck supple.  Cardiovascular:     Rate and Rhythm: Normal rate and regular rhythm.     Heart sounds: No murmur. No friction rub. No  gallop.   Pulmonary:     Effort: Pulmonary effort is normal. No respiratory distress.     Breath sounds: Normal breath sounds. No wheezing or rales.  Abdominal:     General: Bowel sounds are normal. There is no distension.     Palpations: Abdomen is soft. There is no mass.     Tenderness: There is no abdominal tenderness. There is no guarding or rebound.  Musculoskeletal: Normal range of motion.  Neurological:     General: No focal deficit present.     Mental Status: She is alert and oriented to person, place, and time.     Cranial Nerves: No cranial nerve deficit.  Skin:    General: Skin is warm and dry.     Findings: No erythema.  Psychiatric:        Mood and Affect: Mood normal.        Behavior: Behavior normal.        Judgment: Judgment normal.  Female chaperone present for pelvic and breast  portions of the physical exam  Assessment: 68 y.o. [phone] female here for  1. Incomplete uterovaginal prolapse   2. Incomplete defecation      Plan: Problem List Items Addressed This Visit    None    Visit Diagnoses    Incomplete uterovaginal prolapse    -  Primary   Incomplete defecation         We discussed my findings in detail.  She has anterior prolapse to the level of the hymen.  She has no urinary symptoms and does not endorse any urinary leakage.  She does have an element of posterior vaginal wall prolapse (rectal), which is not as significant as her anterior prolapse.  We further discussed her incontinence of stool.  She has significant weakening of the anal sphincter.  In terms of treatment, we discussed treatment to restore the architectural distortion in her pelvic floor.  This could be accomplished by surgery and possibly by the use of the pessary.  However, in terms of her fecal issues, I am not qualified to treat these.  We discussed referral to a urogynecologist to further assess and offer treatment options.  She states that her symptoms are not so bothersome that  she would want to undergo surgery or further treatment at this time.  We discussed that if her symptoms became worse and more bothersome, she could contact me and I would gladly make a referral for her.  All questions answered.  45 minutes spent in face to face discussion with > 50% spent in counseling,management, and coordination of care of her incomplete uterovaginal prolapse and fecal incontinence.   Prentice Docker, MD 05/27/2019 2:07 PM    CC: Jules Husbands, [address] [place],  [place] [id number]

## 2019-05-27 ENCOUNTER — Encounter: Payer: Self-pay | Admitting: Obstetrics and Gynecology

## 2019-06-23 DIAGNOSIS — N189 Chronic kidney disease, unspecified: Secondary | ICD-10-CM

## 2019-06-23 DIAGNOSIS — D638 Anemia in other chronic diseases classified elsewhere: Secondary | ICD-10-CM | POA: Insufficient documentation

## 2019-06-23 DIAGNOSIS — D631 Anemia in chronic kidney disease: Secondary | ICD-10-CM

## 2019-06-23 HISTORY — DX: Anemia in chronic kidney disease: D63.1

## 2019-06-23 HISTORY — DX: Anemia in chronic kidney disease: N18.9

## 2019-09-13 NOTE — Progress Notes (Deleted)
[place]  [phone] Fax:(336) [phone]  ID: Abigail Ortega OB: [birth year]  MR#: [id number]  [id number]  Patient Care Team: Perrin Maltese, MD as PCP - General (Internal Medicine) Bary Castilla, Forest Gleason, MD (General Surgery) Sharene Butters, MD (General Surgery)  CHIEF COMPLAINT: Pathologic stage IIIa triple positive invasive carcinoma of the lower inner quadrant of the right breast.   INTERVAL HISTORY: Patient returns to clinic today for routine 43-month evaluation.  She currently feels well and is asymptomatic.  She continues to tolerate letrozole without significant side effects.  She has no neurologic complaints. She denies any recent fevers or illnesses.  She denies any chest pain, shortness of breath, cough, or hemoptysis.  She denies any nausea, vomiting, constipation, or diarrhea. She has no urinary complaints.  Patient offers no specific complaints today.  REVIEW OF SYSTEMS:   Review of Systems  Constitutional: Negative.  Negative for chills, fever, malaise/fatigue and weight loss.  HENT: Negative.  Negative for tinnitus.   Eyes: Negative.   Respiratory: Negative.  Negative for cough and sputum production.   Cardiovascular: Negative.  Negative for chest pain and leg swelling.  Gastrointestinal: Negative.  Negative for abdominal pain, constipation, diarrhea, nausea and vomiting.  Genitourinary: Negative.  Negative for dysuria.  Musculoskeletal: Negative.  Negative for myalgias.  Skin: Negative.  Negative for rash.  Neurological: Negative.  Negative for sensory change, weakness and headaches.  Psychiatric/Behavioral: Negative.  The patient is not nervous/anxious and does not have insomnia.     As per HPI. Otherwise, a complete review of systems is negative.  PAST MEDICAL HISTORY: Past Medical History:  Diagnosis Date  . Anxiety   . Arthritis   . Breast cancer [facility]) 2014   Right breast cancer - chemo, radiation and Mastectomy  . CHF  (congestive heart failure) ([place]) 2013  . Chronic kidney disease   . Clotting disorder ([place])    blood clots  . Depression   . GERD (gastroesophageal reflux disease)   . GI bleed   . H/O heart artery stent   . Headache   . Heart attack [facility]) 2012   coronary stent x2 completed by Dr. Clayborn Bigness.  Marland Kitchen Heart attack ([place]) Apr 29, 2013  . Hemorrhoids   . Hypertension   . Hypothyroidism   . IBS (irritable bowel syndrome)   . Malignant neoplasm of lower-inner quadrant of female breast [facility]) January 20, 2013.   invasive mammary cancer, minimal 0.85 cm.  Histologic grade 1.  . Personal history of chemotherapy 2014   BREAST CA  . Personal history of radiation therapy 2014   BREAST CA  . Thyroid disease     PAST SURGICAL HISTORY: Past Surgical History:  Procedure Laterality Date  . BOTOX INJECTION N/A 03/14/2017   Procedure: BOTOX INJECTION;  Surgeon: Jules Husbands, MD;  Location: [facility] ORS;  Service: General;  Laterality: N/A;  . BOTOX INJECTION N/A 05/09/2018   Procedure: BOTOX INJECTION;  Surgeon: Jules Husbands, MD;  Location: [facility] ORS;  Service: General;  Laterality: N/A;  . BREAST BIOPSY Right 2014   positive  . BREAST SURGERY Right 02-21-2013   right mastectomy  . CARDIAC CATHETERIZATION    . COLONOSCOPY W/ BIOPSIES  09/15/2010   Colonoscopy completed by Verdie Shire, M.D. Tubular adenoma of the ascending colon and descending colon reported up to 0.4 cm in diameter. No atypia.  . COLONOSCOPY WITH PROPOFOL N/A 12/23/2016   Procedure: COLONOSCOPY WITH PROPOFOL;  Surgeon: Wilford Corner, MD;  Location: [facility]  ENDOSCOPY;  Service: Endoscopy;  Laterality: N/A;  . ESOPHAGOGASTRODUODENOSCOPY N/A 12/13/2016   Procedure: ESOPHAGOGASTRODUODENOSCOPY (EGD);  Surgeon: [name], MD;  Location: [facility] ENDOSCOPY;  Service: Endoscopy;  Laterality: N/A;  . LEFT HEART CATH AND CORONARY ANGIOGRAPHY Left 08/29/2018   Procedure: LEFT HEART CATH AND CORONARY ANGIOGRAPHY;  Surgeon: Dionisio David, MD;  Location: [place] CV LAB;  Service: Cardiovascular;  Laterality: Left;  Marland Kitchen MASTECTOMY Right 2014   BREAST CA  . PORTA CATH INSERTION    . SPHINCTEROTOMY N/A 05/09/2018   Procedure: SPHINCTEROTOMY;  Surgeon: Jules Husbands, MD;  Location: [facility] ORS;  Service: General;  Laterality: N/A;  . UPPER GI ENDOSCOPY  09/15/2010     Completed by Verdie Shire, M.D. for nausea. Normal exam reported.  Marland Kitchen VASCULAR SURGERY      FAMILY HISTORY Family History  Problem Relation Age of Onset  . Breast cancer Mother 25  . Cancer Father        colon       ADVANCED DIRECTIVES:    HEALTH MAINTENANCE: Social History   Tobacco Use  . Smoking status: Former Smoker    Years: 20.00  . Smokeless tobacco: Never Used  Substance Use Topics  . Alcohol use: No  . Drug use: No     Colonoscopy:  PAP:  Bone density:  Lipid panel:  No Known Allergies  Current Outpatient Medications  Medication Sig Dispense Refill  . acetaminophen (TYLENOL 8 HOUR ARTHRITIS PAIN) 650 MG CR tablet Take 1,300 mg by mouth every 8 (eight) hours as needed for pain.     Marland Kitchen alendronate (FOSAMAX) 70 MG tablet TAKE 1 TABLET BY MOUTH ONCE A WEEK. TAKE WITH A FULL GLASS OF WATER ON AN EMPTY STOMACH. 12 tablet 3  . ALPRAZolam (XANAX) 0.25 MG tablet Take 0.25 mg by mouth 2 (two) times daily as needed for anxiety.   2  . atorvastatin (LIPITOR) 40 MG tablet Take 40 mg by mouth daily.     Marland Kitchen azelastine (ASTELIN) 0.1 % nasal spray Place 1 spray into both nostrils 2 (two) times daily as needed for rhinitis.   4  . clopidogrel (PLAVIX) 75 MG tablet Take 75 mg by mouth daily.     . [employer] ALLERGY RELIEF 4 MG tablet Take 4 mg by mouth every 12 (twelve) hours as needed for allergies.   3  . fexofenadine (ALLEGRA) 60 MG tablet Take 30 mg by mouth 2 (two) times daily as needed (allergies.).     Marland Kitchen gabapentin (NEURONTIN) 300 MG capsule Take 1 capsule (300 mg total) by mouth 3 (three) times daily. 90 capsule 0  . hydrALAZINE (APRESOLINE) 25 MG tablet Take 25 mg by  mouth 3 (three) times daily.    . hydrocortisone (ANUSOL-HC) 25 MG suppository Place 1 suppository (25 mg total) rectally as needed for hemorrhoids or anal itching. 12 suppository 1  . isosorbide mononitrate (IMDUR) 30 MG 24 hr tablet Take 30 mg by mouth daily with breakfast.    . letrozole (FEMARA) 2.5 MG tablet Take 1 tablet (2.5 mg total) by mouth daily. 90 tablet 3  . levothyroxine (SYNTHROID, LEVOTHROID) 88 MCG tablet Take 88 mcg by mouth daily before breakfast.     . Magnesium 250 MG TABS Take by mouth.    . meclizine (ANTIVERT) 25 MG tablet Take 2 tablets (50 mg total) by mouth 3 (three) times daily as needed for dizziness or nausea. 30 tablet 1  . montelukast (SINGULAIR) 10 MG tablet Take 10 mg  by mouth at bedtime.    Marland Kitchen nystatin (MYCOSTATIN/NYSTOP) powder Apply 1 g topically 3 (three) times daily as needed (for rash).   1  . Olopatadine HCl 0.2 % SOLN Place 1 drop into both eyes daily as needed (for allergies).   2  . ondansetron (ZOFRAN) 4 MG tablet     . pantoprazole (PROTONIX) 40 MG tablet TAKE 1 TABLET BY MOUTH  DAILY (Patient taking differently: Take 40 mg by mouth daily. ) 90 tablet 1  . promethazine (PHENERGAN) 12.5 MG tablet Take 12.5 mg by mouth every 6 (six) hours as needed for nausea or vomiting.    . ranolazine (RANEXA) 1000 MG SR tablet Take 500 mg by mouth 2 (two) times daily.    . temazepam (RESTORIL) 30 MG capsule Take 30 mg by mouth at bedtime.     . traZODone (DESYREL) 50 MG tablet Take 50 mg by mouth at bedtime.      No current facility-administered medications for this visit.    OBJECTIVE: There were no vitals filed for this visit.   There is no height or weight on file to calculate BMI.    ECOG FS:0 - Asymptomatic  General: Well-developed, well-nourished, no acute distress. Eyes: Pink conjunctiva, anicteric sclera. HEENT: Normocephalic, moist mucous membranes. Breasts: Patient declined exam today. Lungs: Clear to auscultation bilaterally. Heart: Regular rate  and rhythm. No rubs, murmurs, or gallops. Abdomen: Soft, nontender, nondistended. No organomegaly noted, normoactive bowel sounds. Musculoskeletal: No edema, cyanosis, or clubbing. Neuro: Alert, answering all questions appropriately. Cranial nerves grossly intact. Skin: No rashes or petechiae noted. Psych: Normal affect.  LAB RESULTS:  Lab Results  Component Value Date   NA 139 05/02/2018   K 4.3 05/02/2018   CL 101 05/02/2018   CO2 30 05/02/2018   GLUCOSE 112 (H) 05/02/2018   BUN 17 05/02/2018   CREATININE 1.43 (H) 05/02/2018   CALCIUM 10.0 05/02/2018   PROT 6.0 (L) 12/22/2016   ALBUMIN 3.3 (L) 12/22/2016   AST 18 12/22/2016   ALT 15 12/22/2016   ALKPHOS 76 12/22/2016   BILITOT 0.8 12/22/2016   GFRNONAA 37 (L) 05/02/2018   GFRAA 43 (L) 05/02/2018    Lab Results  Component Value Date   WBC 6.1 05/02/2018   NEUTROABS 5.1 11/02/2016   HGB 11.2 (L) 05/02/2018   HCT 35.2 (L) 05/02/2018   MCV 93.9 05/02/2018   PLT 391 05/02/2018     STUDIES: No results found.  ASSESSMENT: Pathologic stage IIIa triple positive invasive carcinoma of the lower inner quadrant of the right breast.   PLAN:    1. Pathologic stage IIIa triple positive invasive carcinoma of the lower inner quadrant of the right breast: Patient has now completed all of her treatments including XRT. She only completed 14 of 18 infusions of Herceptin secondary to persistent nausea. Her CA 27.29 continues to be within normal limits.  Patient initiated letrozole in October 2015, but given her high stage and high risk of recurrence she has agreed to continue letrozole for a minimum of 7 years completing in October 2022.  Her most recent mammogram on December 27, 2018 was reported as BI-RADS 1.  Repeat in June 2021.  Return to clinic in 6 months for routine evaluation. 2. Osteoporosis: Bone mineral density completed on December 27, 2018 reported T score of -2.7 which is essentially unchanged from 1 year prior.  Continue Fosamax,  calcium, and vitamin D.  Repeat in June 2021. 3.  Anal fissure: Resolved.  Patient expressed understanding  and was in agreement with this plan. She also understands that She can call clinic at any time with any questions, concerns, or complaints.   Breast cancer of lower-inner quadrant of right female breast   Staging form: Breast, AJCC 7th Edition     Pathologic stage from 02/24/2015: Stage IIIA (T1c, N2b, cM0) - Signed by Lloyd Huger, MD on 02/24/2015   Lloyd Huger, MD 09/13/19 9:36 AM

## 2019-09-17 ENCOUNTER — Inpatient Hospital Stay: Payer: Medicare Other | Admitting: Oncology

## 2019-09-21 NOTE — Progress Notes (Signed)
[place]  Telephone:(336) [phone] Fax:(336) [phone]  ID: Durwin Reges OB: April 02, 1951  MR#: [id number]  [id number]  Patient Care Team: Perrin Maltese, MD as PCP - General (Internal Medicine) Bary Castilla, Forest Gleason, MD (General Surgery) Sharene Butters, MD (General Surgery)  CHIEF COMPLAINT: Pathologic stage IIIa triple positive invasive carcinoma of the lower inner quadrant of the right breast.   INTERVAL HISTORY: Patient returns to clinic today for routine 74-month evaluation.  She has some mild right flank pain and reports a CT scan has been ordered by her primary care physician.  She continues to have chronic nausea that has been unchanged for years.  She is tolerating letrozole without significant side effects.  She has no neurologic complaints. She denies any recent fevers or illnesses.  She denies any chest pain, shortness of breath, cough, or hemoptysis.  She denies any vomiting, constipation, or diarrhea. She has no urinary complaints.  Patient offers no further specific complaints today.  REVIEW OF SYSTEMS:   Review of Systems  Constitutional: Negative.  Negative for chills, fever, malaise/fatigue and weight loss.  HENT: Negative.  Negative for tinnitus.   Eyes: Negative.   Respiratory: Negative.  Negative for cough and sputum production.   Cardiovascular: Negative.  Negative for chest pain and leg swelling.  Gastrointestinal: Positive for nausea. Negative for abdominal pain, constipation, diarrhea and vomiting.  Genitourinary: Positive for flank pain. Negative for dysuria.  Musculoskeletal: Negative for myalgias.  Skin: Negative.  Negative for rash.  Neurological: Negative.  Negative for sensory change, weakness and headaches.  Psychiatric/Behavioral: Negative.  The patient is not nervous/anxious and does not have insomnia.     As per HPI. Otherwise, a complete review of systems is negative.  PAST MEDICAL HISTORY: Past Medical History:    Diagnosis Date  . Anxiety   . Arthritis   . Breast cancer [facility]) 2014   Right breast cancer - chemo, radiation and Mastectomy  . CHF (congestive heart failure) ([place]) 2013  . Chronic kidney disease   . Clotting disorder ([place])    blood clots  . Depression   . GERD (gastroesophageal reflux disease)   . GI bleed   . H/O heart artery stent   . Headache   . Heart attack [facility]) 2012   coronary stent x2 completed by Dr. Clayborn Bigness.  Marland Kitchen Heart attack ([place]) Apr 29, 2013  . Hemorrhoids   . Hypertension   . Hypothyroidism   . IBS (irritable bowel syndrome)   . Malignant neoplasm of lower-inner quadrant of female breast [facility]) January 20, 2013.   invasive mammary cancer, minimal 0.85 cm.  Histologic grade 1.  . Personal history of chemotherapy 2014   BREAST CA  . Personal history of radiation therapy 2014   BREAST CA  . Thyroid disease     PAST SURGICAL HISTORY: Past Surgical History:  Procedure Laterality Date  . BOTOX INJECTION N/A 03/14/2017   Procedure: BOTOX INJECTION;  Surgeon: Jules Husbands, MD;  Location: [facility] ORS;  Service: General;  Laterality: N/A;  . BOTOX INJECTION N/A 05/09/2018   Procedure: BOTOX INJECTION;  Surgeon: Jules Husbands, MD;  Location: [facility] ORS;  Service: General;  Laterality: N/A;  . BREAST BIOPSY Right 2014   positive  . BREAST SURGERY Right 02-21-2013   right mastectomy  . CARDIAC CATHETERIZATION    . COLONOSCOPY W/ BIOPSIES  09/15/2010   Colonoscopy completed by Verdie Shire, M.D. Tubular adenoma of the ascending colon and descending colon reported up to 0.4  cm in diameter. No atypia.  . COLONOSCOPY WITH PROPOFOL N/A 12/23/2016   Procedure: COLONOSCOPY WITH PROPOFOL;  Surgeon: Wilford Corner, MD;  Location: [facility] ENDOSCOPY;  Service: Endoscopy;  Laterality: N/A;  . ESOPHAGOGASTRODUODENOSCOPY N/A 12/13/2016   Procedure: ESOPHAGOGASTRODUODENOSCOPY (EGD);  Surgeon: [name], MD;  Location: [facility] ENDOSCOPY;  Service: Endoscopy;  Laterality: N/A;  . LEFT HEART CATH AND  CORONARY ANGIOGRAPHY Left 08/29/2018   Procedure: LEFT HEART CATH AND CORONARY ANGIOGRAPHY;  Surgeon: Dionisio David, MD;  Location: [place] CV LAB;  Service: Cardiovascular;  Laterality: Left;  Marland Kitchen MASTECTOMY Right 2014   BREAST CA  . PORTA CATH INSERTION    . SPHINCTEROTOMY N/A 05/09/2018   Procedure: SPHINCTEROTOMY;  Surgeon: Jules Husbands, MD;  Location: [facility] ORS;  Service: General;  Laterality: N/A;  . UPPER GI ENDOSCOPY  09/15/2010     Completed by Verdie Shire, M.D. for nausea. Normal exam reported.  Marland Kitchen VASCULAR SURGERY      FAMILY HISTORY Family History  Problem Relation Age of Onset  . Breast cancer Mother 16  . Cancer Father        colon       ADVANCED DIRECTIVES:    HEALTH MAINTENANCE: Social History   Tobacco Use  . Smoking status: Former Smoker    Years: 20.00  . Smokeless tobacco: Never Used  Substance Use Topics  . Alcohol use: No  . Drug use: No     Colonoscopy:  PAP:  Bone density:  Lipid panel:  No Known Allergies  Current Outpatient Medications  Medication Sig Dispense Refill  . acetaminophen (TYLENOL 8 HOUR ARTHRITIS PAIN) 650 MG CR tablet Take 1,300 mg by mouth every 8 (eight) hours as needed for pain.     Marland Kitchen ALPRAZolam (XANAX) 0.25 MG tablet Take 0.25 mg by mouth 2 (two) times daily as needed for anxiety.   2  . atorvastatin (LIPITOR) 40 MG tablet Take 80 mg by mouth daily.     Marland Kitchen azelastine (ASTELIN) 0.1 % nasal spray Place 1 spray into both nostrils 2 (two) times daily as needed for rhinitis.   4  . clopidogrel (PLAVIX) 75 MG tablet Take 75 mg by mouth daily.     . [employer] ALLERGY RELIEF 4 MG tablet Take 4 mg by mouth every 12 (twelve) hours as needed for allergies.   3  . dicyclomine (BENTYL) 10 MG capsule Take 20 mg by mouth every 8 (eight) hours as needed.    . diphenhydrAMINE (BENADRYL) 25 mg capsule Take     as needed    . fexofenadine (ALLEGRA) 60 MG tablet Take 30 mg by mouth 2 (two) times daily as needed (allergies.).     Marland Kitchen gabapentin  (NEURONTIN) 300 MG capsule Take 1 capsule (300 mg total) by mouth 3 (three) times daily. 90 capsule 0  . hydrALAZINE (APRESOLINE) 25 MG tablet Take 25 mg by mouth 3 (three) times daily.    . isosorbide mononitrate (IMDUR) 30 MG 24 hr tablet Take 30 mg by mouth daily with breakfast.    . letrozole (FEMARA) 2.5 MG tablet Take 1 tablet (2.5 mg total) by mouth daily. 90 tablet 3  . levothyroxine (SYNTHROID, LEVOTHROID) 88 MCG tablet Take 88 mcg by mouth daily before breakfast.     . Magnesium 250 MG TABS Take by mouth.    . meclizine (ANTIVERT) 25 MG tablet Take 2 tablets (50 mg total) by mouth 3 (three) times daily as needed for dizziness or nausea. 30 tablet 1  .  montelukast (SINGULAIR) 10 MG tablet Take 10 mg by mouth at bedtime.    Marland Kitchen nystatin (MYCOSTATIN/NYSTOP) powder Apply 1 g topically 3 (three) times daily as needed (for rash).   1  . Olopatadine HCl 0.2 % SOLN Place 1 drop into both eyes daily as needed (for allergies).   2  . ondansetron (ZOFRAN) 4 MG tablet     . pantoprazole (PROTONIX) 40 MG tablet TAKE 1 TABLET BY MOUTH  DAILY (Patient taking differently: Take 40 mg by mouth daily. ) 90 tablet 1  . promethazine (PHENERGAN) 12.5 MG tablet Take 12.5 mg by mouth every 6 (six) hours as needed for nausea or vomiting.    . ranolazine (RANEXA) 1000 MG SR tablet Take 500 mg by mouth 2 (two) times daily.    . temazepam (RESTORIL) 30 MG capsule Take 30 mg by mouth at bedtime.     . traZODone (DESYREL) 50 MG tablet Take 50 mg by mouth at bedtime.     Marland Kitchen alendronate (FOSAMAX) 70 MG tablet TAKE 1 TABLET BY MOUTH ONCE A WEEK. TAKE WITH A FULL GLASS OF WATER ON AN EMPTY STOMACH. (Patient not taking: Reported on 09/26/2019) 12 tablet 3  . hydrocortisone (ANUSOL-HC) 25 MG suppository Place 1 suppository (25 mg total) rectally as needed for hemorrhoids or anal itching. (Patient not taking: Reported on 09/26/2019) 12 suppository 1   No current facility-administered medications for this visit.     OBJECTIVE: Vitals:   09/26/19 1415  BP: 114/69  Pulse: 69  Resp: 18  Temp: 97.8 F (36.6 C)     Body mass index is 29.59 kg/m.    ECOG FS:0 - Asymptomatic  General: Well-developed, well-nourished, no acute distress. Eyes: Pink conjunctiva, anicteric sclera. HEENT: Normocephalic, moist mucous membranes. Breast: Exam deferred today. Lungs: No audible wheezing or coughing. Heart: Regular rate and rhythm. Abdomen: Soft, nontender, no obvious distention. Musculoskeletal: No edema, cyanosis, or clubbing. Neuro: Alert, answering all questions appropriately. Cranial nerves grossly intact. Skin: No rashes or petechiae noted. Psych: Normal affect.  LAB RESULTS:  Lab Results  Component Value Date   NA 139 05/02/2018   K 4.3 05/02/2018   CL 101 05/02/2018   CO2 30 05/02/2018   GLUCOSE 112 (H) 05/02/2018   BUN 17 05/02/2018   CREATININE 1.43 (H) 05/02/2018   CALCIUM 10.0 05/02/2018   PROT 6.0 (L) 12/22/2016   ALBUMIN 3.3 (L) 12/22/2016   AST 18 12/22/2016   ALT 15 12/22/2016   ALKPHOS 76 12/22/2016   BILITOT 0.8 12/22/2016   GFRNONAA 37 (L) 05/02/2018   GFRAA 43 (L) 05/02/2018    Lab Results  Component Value Date   WBC 6.1 05/02/2018   NEUTROABS 5.1 11/02/2016   HGB 11.2 (L) 05/02/2018   HCT 35.2 (L) 05/02/2018   MCV 93.9 05/02/2018   PLT 391 05/02/2018     STUDIES: No results found.  ASSESSMENT: Pathologic stage IIIa triple positive invasive carcinoma of the lower inner quadrant of the right breast.   PLAN:    1. Pathologic stage IIIa triple positive invasive carcinoma of the lower inner quadrant of the right breast: Patient has now completed all of her treatments including XRT. She only completed 14 of 18 infusions of Herceptin secondary to persistent nausea. Her CA 27.29 continues to be within normal limits.  Patient initiated letrozole in October 2015, but given her high stage and high risk of recurrence she has agreed to continue letrozole for a minimum of  7 years completing in October 2022.  Her most recent mammogram on December 27, 2018 was reported as BI-RADS 1.  Repeat in June 2021.  Return to clinic in 6 months for routine evaluation. 2. Osteoporosis: Bone mineral density completed on December 27, 2018 reported T score of -2.7 which is essentially unchanged from 1 year prior.  Continue Fosamax, calcium, and vitamin D.  Repeat in June 2021 along with mammogram as above. 3.  Anal fissure: Resolved. 4.  Right flank pain: Patient reports a CT scan has been ordered by her primary care physician. 5.  Nausea: Chronic and unchanged.  Continue monitoring and treatment per primary care.  I spent a total of 20 minutes reviewing chart data, face-to-face evaluation with the patient, counseling and coordination of care as detailed above.   Patient expressed understanding and was in agreement with this plan. She also understands that She can call clinic at any time with any questions, concerns, or complaints.   Breast cancer of lower-inner quadrant of right female breast   Staging form: Breast, AJCC 7th Edition     Pathologic stage from 02/24/2015: Stage IIIA (T1c, N2b, cM0) - Signed by Lloyd Huger, MD on 02/24/2015   Lloyd Huger, MD 09/26/19 4:01 PM

## 2019-09-25 ENCOUNTER — Ambulatory Visit: Payer: Medicare Other | Admitting: Oncology

## 2019-09-26 ENCOUNTER — Inpatient Hospital Stay: Payer: Medicare Other | Attending: Oncology | Admitting: Oncology

## 2019-09-26 ENCOUNTER — Encounter: Payer: Self-pay | Admitting: Oncology

## 2019-09-26 VITALS — BP 114/69 | HR 69 | Temp 97.8°F | Resp 18 | Wt 188.9 lb

## 2019-09-26 DIAGNOSIS — Z79811 Long term (current) use of aromatase inhibitors: Secondary | ICD-10-CM | POA: Diagnosis not present

## 2019-09-26 DIAGNOSIS — Z170 Estrogen receptor positive status [ER+]: Secondary | ICD-10-CM | POA: Diagnosis not present

## 2019-09-26 DIAGNOSIS — R109 Unspecified abdominal pain: Secondary | ICD-10-CM | POA: Diagnosis not present

## 2019-09-26 DIAGNOSIS — I509 Heart failure, unspecified: Secondary | ICD-10-CM | POA: Insufficient documentation

## 2019-09-26 DIAGNOSIS — E039 Hypothyroidism, unspecified: Secondary | ICD-10-CM | POA: Insufficient documentation

## 2019-09-26 DIAGNOSIS — R110 Nausea: Secondary | ICD-10-CM | POA: Insufficient documentation

## 2019-09-26 DIAGNOSIS — N189 Chronic kidney disease, unspecified: Secondary | ICD-10-CM | POA: Insufficient documentation

## 2019-09-26 DIAGNOSIS — Z79899 Other long term (current) drug therapy: Secondary | ICD-10-CM | POA: Diagnosis not present

## 2019-09-26 DIAGNOSIS — I130 Hypertensive heart and chronic kidney disease with heart failure and stage 1 through stage 4 chronic kidney disease, or unspecified chronic kidney disease: Secondary | ICD-10-CM | POA: Diagnosis not present

## 2019-09-26 DIAGNOSIS — C50311 Malignant neoplasm of lower-inner quadrant of right female breast: Secondary | ICD-10-CM | POA: Insufficient documentation

## 2019-09-26 DIAGNOSIS — Z86010 Personal history of colonic polyps: Secondary | ICD-10-CM | POA: Insufficient documentation

## 2019-09-26 NOTE — Progress Notes (Signed)
Patient here for follow up. Pt reports having nausea ever since she completed chemo 6 months. Phenergan and zofran not working for nausea. Pt reports having chest tightness and was seen by Dr.Khan for eval. Pt had a stress test and echo. No new breast problem voiced.

## 2019-12-30 ENCOUNTER — Other Ambulatory Visit: Payer: Medicare Other

## 2020-01-13 ENCOUNTER — Ambulatory Visit
Admission: RE | Admit: 2020-01-13 | Discharge: 2020-01-13 | Disposition: A | Discharge status: Home / Self Care | Payer: Medicare Other | Source: Ambulatory Visit | Attending: Oncology | Admitting: Oncology

## 2020-01-13 DIAGNOSIS — M810 Age-related osteoporosis without current pathological fracture: Secondary | ICD-10-CM | POA: Diagnosis not present

## 2020-01-13 DIAGNOSIS — C50311 Malignant neoplasm of lower-inner quadrant of right female breast: Secondary | ICD-10-CM

## 2020-01-13 DIAGNOSIS — Z1231 Encounter for screening mammogram for malignant neoplasm of breast: Secondary | ICD-10-CM | POA: Diagnosis not present

## 2020-01-13 DIAGNOSIS — Z780 Asymptomatic menopausal state: Secondary | ICD-10-CM | POA: Diagnosis not present

## 2020-01-13 DIAGNOSIS — Z170 Estrogen receptor positive status [ER+]: Secondary | ICD-10-CM

## 2020-01-13 DIAGNOSIS — Z853 Personal history of malignant neoplasm of breast: Secondary | ICD-10-CM | POA: Insufficient documentation

## 2020-02-18 ENCOUNTER — Other Ambulatory Visit: Payer: Self-pay | Admitting: Oncology

## 2020-03-29 ENCOUNTER — Ambulatory Visit: Payer: Medicare Other | Admitting: Oncology

## 2020-04-02 ENCOUNTER — Other Ambulatory Visit: Payer: Self-pay

## 2020-04-02 ENCOUNTER — Encounter
Admission: RE | Disposition: A | Discharge status: Home / Self Care | Payer: Self-pay | Source: Ambulatory Visit | Attending: Cardiovascular Disease

## 2020-04-02 ENCOUNTER — Ambulatory Visit
Admission: RE | Admit: 2020-04-02 | Discharge: 2020-04-02 | Disposition: A | Discharge status: Home / Self Care | Payer: Medicare Other | Source: Ambulatory Visit | Attending: Cardiovascular Disease | Admitting: Cardiovascular Disease

## 2020-04-02 ENCOUNTER — Encounter: Payer: Self-pay | Admitting: Cardiovascular Disease

## 2020-04-02 ENCOUNTER — Ambulatory Visit: Payer: Self-pay | Admitting: Cardiovascular Disease

## 2020-04-02 DIAGNOSIS — Z87891 Personal history of nicotine dependence: Secondary | ICD-10-CM | POA: Diagnosis not present

## 2020-04-02 DIAGNOSIS — Z79899 Other long term (current) drug therapy: Secondary | ICD-10-CM | POA: Insufficient documentation

## 2020-04-02 DIAGNOSIS — N183 Chronic kidney disease, stage 3 unspecified: Secondary | ICD-10-CM | POA: Diagnosis not present

## 2020-04-02 DIAGNOSIS — Z887 Allergy status to serum and vaccine status: Secondary | ICD-10-CM | POA: Insufficient documentation

## 2020-04-02 DIAGNOSIS — Z955 Presence of coronary angioplasty implant and graft: Secondary | ICD-10-CM | POA: Diagnosis not present

## 2020-04-02 DIAGNOSIS — E785 Hyperlipidemia, unspecified: Secondary | ICD-10-CM | POA: Diagnosis not present

## 2020-04-02 DIAGNOSIS — E039 Hypothyroidism, unspecified: Secondary | ICD-10-CM | POA: Insufficient documentation

## 2020-04-02 DIAGNOSIS — R079 Chest pain, unspecified: Secondary | ICD-10-CM | POA: Diagnosis not present

## 2020-04-02 DIAGNOSIS — Z881 Allergy status to other antibiotic agents status: Secondary | ICD-10-CM | POA: Diagnosis not present

## 2020-04-02 DIAGNOSIS — K219 Gastro-esophageal reflux disease without esophagitis: Secondary | ICD-10-CM | POA: Insufficient documentation

## 2020-04-02 DIAGNOSIS — F419 Anxiety disorder, unspecified: Secondary | ICD-10-CM | POA: Insufficient documentation

## 2020-04-02 DIAGNOSIS — I2510 Atherosclerotic heart disease of native coronary artery without angina pectoris: Secondary | ICD-10-CM | POA: Insufficient documentation

## 2020-04-02 DIAGNOSIS — Z79890 Hormone replacement therapy (postmenopausal): Secondary | ICD-10-CM | POA: Insufficient documentation

## 2020-04-02 DIAGNOSIS — R0602 Shortness of breath: Secondary | ICD-10-CM | POA: Diagnosis not present

## 2020-04-02 DIAGNOSIS — I129 Hypertensive chronic kidney disease with stage 1 through stage 4 chronic kidney disease, or unspecified chronic kidney disease: Secondary | ICD-10-CM | POA: Insufficient documentation

## 2020-04-02 DIAGNOSIS — I6522 Occlusion and stenosis of left carotid artery: Secondary | ICD-10-CM | POA: Insufficient documentation

## 2020-04-02 DIAGNOSIS — I739 Peripheral vascular disease, unspecified: Secondary | ICD-10-CM | POA: Diagnosis not present

## 2020-04-02 HISTORY — PX: LEFT HEART CATH: CATH118248

## 2020-04-02 LAB — PROTIME-INR
INR: 0.9 (ref 0.8–1.2)
Prothrombin Time: 11.5 seconds (ref 11.4–15.2)

## 2020-04-02 LAB — CBC
HCT: 31.3 % — ABNORMAL LOW (ref 36.0–46.0)
Hemoglobin: 10.9 g/dL — ABNORMAL LOW (ref 12.0–15.0)
MCH: 31.1 pg (ref 26.0–34.0)
MCHC: 34.8 g/dL (ref 30.0–36.0)
MCV: 89.4 fL (ref 80.0–100.0)
Platelets: 320 10*3/uL (ref 150–400)
RBC: 3.5 MIL/uL — ABNORMAL LOW (ref 3.87–5.11)
RDW: 12.1 % (ref 11.5–15.5)
WBC: 5.6 10*3/uL (ref 4.0–10.5)
nRBC: 0 % (ref 0.0–0.2)

## 2020-04-02 LAB — BASIC METABOLIC PANEL
Anion gap: 10 (ref 5–15)
BUN: 12 mg/dL (ref 8–23)
CO2: 26 mmol/L (ref 22–32)
Calcium: 9.6 mg/dL (ref 8.9–10.3)
Chloride: 91 mmol/L — ABNORMAL LOW (ref 98–111)
Creatinine, Ser: 1.38 mg/dL — ABNORMAL HIGH (ref 0.44–1.00)
GFR calc Af Amer: 45 mL/min — ABNORMAL LOW (ref >60)
GFR calc non Af Amer: 39 mL/min — ABNORMAL LOW (ref >60)
Glucose, Bld: 102 mg/dL — ABNORMAL HIGH (ref 70–99)
Potassium: 4 mmol/L (ref 3.5–5.1)
Sodium: 127 mmol/L — ABNORMAL LOW (ref 135–145)

## 2020-04-02 LAB — APTT: aPTT: 42 seconds — ABNORMAL HIGH (ref 24–36)

## 2020-04-02 SURGERY — LEFT HEART CATH
Anesthesia: Moderate Sedation

## 2020-04-02 MED ORDER — SODIUM CHLORIDE 0.9 % WEIGHT BASED INFUSION: 1.0000 mL/kg/h | INTRAVENOUS | Status: DC

## 2020-04-02 MED ORDER — ASPIRIN 81 MG PO CHEW: 81.0000 mg | CHEWABLE_TABLET | ORAL | Status: DC

## 2020-04-02 MED ORDER — MIDAZOLAM HCL 2 MG/2ML IJ SOLN
INTRAMUSCULAR | Status: AC
  Filled 2020-04-02: qty 2

## 2020-04-02 MED ORDER — SODIUM CHLORIDE 0.9 % IV SOLN: 250.0000 mL | INTRAVENOUS | Status: DC | PRN

## 2020-04-02 MED ORDER — HEPARIN (PORCINE) IN NACL 1000-0.9 UT/500ML-% IV SOLN
INTRAVENOUS | Status: AC
  Filled 2020-04-02: qty 1000

## 2020-04-02 MED ORDER — FENTANYL CITRATE (PF) 100 MCG/2ML IJ SOLN
INTRAMUSCULAR | Status: DC | PRN
Start: 2020-04-02 — End: 2020-04-02
  Administered 2020-04-02: 25 ug via INTRAVENOUS

## 2020-04-02 MED ORDER — HEPARIN (PORCINE) IN NACL 1000-0.9 UT/500ML-% IV SOLN
INTRAVENOUS | Status: DC | PRN
  Administered 2020-04-02: 500 mL

## 2020-04-02 MED ORDER — SODIUM CHLORIDE 0.9% FLUSH
3.0000 mL | Freq: Two times a day (BID) | INTRAVENOUS | Status: DC
  Filled 2020-04-02: qty 3

## 2020-04-02 MED ORDER — IOHEXOL 300 MG/ML  SOLN
INTRAMUSCULAR | Status: DC | PRN
  Administered 2020-04-02: 85 mL

## 2020-04-02 MED ORDER — SODIUM CHLORIDE 0.9 % WEIGHT BASED INFUSION: 3.0000 mL/kg/h | INTRAVENOUS | Status: DC

## 2020-04-02 MED ORDER — ASPIRIN 81 MG PO CHEW
CHEWABLE_TABLET | ORAL | Status: AC
  Administered 2020-04-02: 81 mg via ORAL
  Filled 2020-04-02: qty 1

## 2020-04-02 MED ORDER — MIDAZOLAM HCL 2 MG/2ML IJ SOLN
INTRAMUSCULAR | Status: DC | PRN
  Administered 2020-04-02: 1 mg via INTRAVENOUS

## 2020-04-02 MED ORDER — FENTANYL CITRATE (PF) 100 MCG/2ML IJ SOLN
INTRAMUSCULAR | Status: AC
  Filled 2020-04-02: qty 2

## 2020-04-02 MED ORDER — SODIUM CHLORIDE 0.9% FLUSH: 3.0000 mL | INTRAVENOUS | Status: DC | PRN

## 2020-04-02 SURGICAL SUPPLY — 9 items
CATH INFINITI 5FR ANG PIGTAIL (CATHETERS) ×3 IMPLANT
CATH INFINITI 5FR JL4 (CATHETERS) ×3 IMPLANT
CATH INFINITI JR4 5F (CATHETERS) ×3 IMPLANT
DEVICE CLOSURE MYNXGRIP 5F (Vascular Products) ×3 IMPLANT
KIT MANI 3VAL PERCEP (MISCELLANEOUS) ×3 IMPLANT
NEEDLE PERC 18GX7CM (NEEDLE) ×3 IMPLANT
PACK CARDIAC CATH (CUSTOM PROCEDURE TRAY) ×3 IMPLANT
SHEATH AVANTI 5FR X 11CM (SHEATH) ×3 IMPLANT
WIRE GUIDERIGHT .035X150 (WIRE) ×3 IMPLANT

## 2020-04-02 NOTE — Discharge Instructions (Signed)

## 2020-04-05 ENCOUNTER — Encounter: Payer: Self-pay | Admitting: Cardiovascular Disease

## 2020-04-09 NOTE — Progress Notes (Signed)
Abigail Ortega  [phone] [phone]) [phone]  ID: Durwin Reges OB: 08/25/1950  MR#: [id number]  CVE#:[id number]  Patient Care Team: Perrin Maltese, MD as PCP - General (Internal Medicine) Bary Castilla, Forest Gleason, MD (General Surgery) Sharene Butters, MD (General Surgery)  CHIEF COMPLAINT: Pathologic stage IIIa triple positive invasive carcinoma of the lower inner quadrant of the right breast.   INTERVAL HISTORY: Patient returns to clinic today for routine 5-month evaluation.  She currently feels well and is asymptomatic. She continues to have chronic nausea that has been unchanged for years.  She continues to tolerate letrozole without significant side effects.  She has no neurologic complaints. She denies any recent fevers or illnesses.  She denies any chest pain, shortness of breath, cough, or hemoptysis.  She denies any vomiting, constipation, or diarrhea. She has no urinary complaints.  Patient offers no further specific complaints today.  REVIEW OF SYSTEMS:   Review of Systems  Constitutional: Negative.  Negative for chills, fever, malaise/fatigue and weight loss.  HENT: Negative.  Negative for tinnitus.   Eyes: Negative.   Respiratory: Negative.  Negative for cough and sputum production.   Cardiovascular: Negative.  Negative for chest pain and leg swelling.  Gastrointestinal: Positive for nausea. Negative for abdominal pain, constipation, diarrhea and vomiting.  Genitourinary: Negative.  Negative for dysuria and flank pain.  Musculoskeletal: Negative.  Negative for myalgias.  Skin: Negative.  Negative for rash.  Neurological: Negative.  Negative for sensory change, weakness and headaches.  Psychiatric/Behavioral: Negative.  The patient is not nervous/anxious and does not have insomnia.     As per HPI. Otherwise, a complete review of systems is negative.  PAST MEDICAL HISTORY: Past Medical History:  Diagnosis Date   Anxiety    Arthritis     Breast cancer ([place]) 2014   Right breast cancer - chemo, radiation and Mastectomy   CHF (congestive heart failure) ([place]) 2013   Chronic kidney disease    Clotting disorder ([facility])    blood clots   Depression    GERD (gastroesophageal reflux disease)    GI bleed    H/O heart artery stent    Headache    Heart attack ([place]) 2012   coronary stent x2 completed by Dr. Clayborn Bigness.   Heart attack ([place]) Apr 29, 2013   Hemorrhoids    Hypertension    Hypothyroidism    IBS (irritable bowel syndrome)    Malignant neoplasm of lower-inner quadrant of female breast ([place]) January 20, 2013.   invasive mammary cancer, minimal 0.85 cm.  Histologic grade 1.   Personal history of chemotherapy 2014   BREAST CA   Personal history of radiation therapy 2014   BREAST CA   Thyroid disease     PAST SURGICAL HISTORY: Past Surgical History:  Procedure Laterality Date   BOTOX INJECTION N/A 03/14/2017   Procedure: BOTOX INJECTION;  Surgeon: Jules Husbands, MD;  Location: [facility] ORS;  Service: General;  Laterality: N/A;   BOTOX INJECTION N/A 05/09/2018   Procedure: BOTOX INJECTION;  Surgeon: Jules Husbands, MD;  Location: [facility] ORS;  Service: General;  Laterality: N/A;   BREAST BIOPSY Right 2014   positive   BREAST SURGERY Right 02-21-2013   right mastectomy   CARDIAC CATHETERIZATION     COLONOSCOPY W/ BIOPSIES  09/15/2010   Colonoscopy completed by Verdie Shire, M.D. Tubular adenoma of the ascending colon and descending colon reported up to 0.4 cm in diameter. No atypia.   COLONOSCOPY WITH PROPOFOL N/A  12/23/2016   Procedure: COLONOSCOPY WITH PROPOFOL;  Surgeon: Wilford Corner, MD;  Location: [facility] ENDOSCOPY;  Service: Endoscopy;  Laterality: N/A;   ESOPHAGOGASTRODUODENOSCOPY N/A 12/13/2016   Procedure: ESOPHAGOGASTRODUODENOSCOPY (EGD);  Surgeon: [name], MD;  Location: [facility] ENDOSCOPY;  Service: Endoscopy;  Laterality: N/A;   LEFT HEART CATH N/A 04/02/2020   Procedure: Left Heart Cath with  Coronary Angiography;  Surgeon: Dionisio David, MD;  Location: [place] CV LAB;  Service: Cardiovascular;  Laterality: N/A;   LEFT HEART CATH AND CORONARY ANGIOGRAPHY Left 08/29/2018   Procedure: LEFT HEART CATH AND CORONARY ANGIOGRAPHY;  Surgeon: Dionisio David, MD;  Location: [place] CV LAB;  Service: Cardiovascular;  Laterality: Left;   MASTECTOMY Right 2014   BREAST CA   PORTA CATH INSERTION     SPHINCTEROTOMY N/A 05/09/2018   Procedure: SPHINCTEROTOMY;  Surgeon: Jules Husbands, MD;  Location: [facility] ORS;  Service: General;  Laterality: N/A;   UPPER GI ENDOSCOPY  09/15/2010     Completed by Verdie Shire, M.D. for nausea. Normal exam reported.   VASCULAR SURGERY      FAMILY HISTORY Family History  Problem Relation Age of Onset   Breast cancer Mother 80   Cancer Father        colon       ADVANCED DIRECTIVES:    HEALTH MAINTENANCE: Social History   Tobacco Use   Smoking status: Former Smoker    Years: 20.00   Smokeless tobacco: Never Used  Scientific laboratory technician Use: Never used  Substance Use Topics   Alcohol use: No   Drug use: No     Colonoscopy:  PAP:  Bone density:  Lipid panel:  No Known Allergies  Current Outpatient Medications  Medication Sig Dispense Refill   acetaminophen (TYLENOL 8 HOUR ARTHRITIS PAIN) 650 MG CR tablet Take 1,300 mg by mouth every 8 (eight) hours as needed for pain.      alendronate (FOSAMAX) 70 MG tablet TAKE 1 TABLET BY MOUTH ONCE A WEEK. TAKE WITH A FULL GLASS OF WATER ON AN EMPTY STOMACH. 12 tablet 3   ALPRAZolam (XANAX) 0.25 MG tablet Take 0.25 mg by mouth 2 (two) times daily as needed for anxiety.   2   atorvastatin (LIPITOR) 40 MG tablet Take 80 mg by mouth daily.      azelastine (ASTELIN) 0.1 % nasal spray Place 1 spray into both nostrils 2 (two) times daily as needed for rhinitis.   4   clopidogrel (PLAVIX) 75 MG tablet Take 75 mg by mouth daily.      [employer] ALLERGY RELIEF 4 MG tablet Take 4 mg by mouth  every 12 (twelve) hours as needed for allergies.   3   dicyclomine (BENTYL) 10 MG capsule Take 20 mg by mouth every 8 (eight) hours as needed.     diphenhydrAMINE (BENADRYL) 25 mg capsule Take     as needed     fexofenadine (ALLEGRA) 60 MG tablet Take 30 mg by mouth 2 (two) times daily as needed (allergies.).      gabapentin (NEURONTIN) 300 MG capsule Take 1 capsule (300 mg total) by mouth 3 (three) times daily. 90 capsule 0   hydrALAZINE (APRESOLINE) 25 MG tablet Take 25 mg by mouth 3 (three) times daily.      hydrocortisone (ANUSOL-HC) 25 MG suppository Place 1 suppository (25 mg total) rectally as needed for hemorrhoids or anal itching. 12 suppository 1   isosorbide mononitrate (IMDUR) 30 MG 24 hr tablet Take 30  mg by mouth daily with breakfast.     letrozole (FEMARA) 2.5 MG tablet TAKE 1 TABLET BY MOUTH  DAILY 90 tablet 3   levothyroxine (SYNTHROID, LEVOTHROID) 88 MCG tablet Take 88 mcg by mouth daily before breakfast.      Magnesium 250 MG TABS Take by mouth.      meclizine (ANTIVERT) 25 MG tablet Take 2 tablets (50 mg total) by mouth 3 (three) times daily as needed for dizziness or nausea. 30 tablet 1   montelukast (SINGULAIR) 10 MG tablet Take 10 mg by mouth at bedtime.     nystatin (MYCOSTATIN/NYSTOP) powder Apply 1 g topically 3 (three) times daily as needed (for rash).   1   Olopatadine HCl 0.2 % SOLN Place 1 drop into both eyes daily as needed (for allergies).   2   ondansetron (ZOFRAN) 4 MG tablet      pantoprazole (PROTONIX) 40 MG tablet TAKE 1 TABLET BY MOUTH  DAILY (Patient taking differently: Take 40 mg by mouth daily. ) 90 tablet 1   promethazine (PHENERGAN) 12.5 MG tablet Take 12.5 mg by mouth every 6 (six) hours as needed for nausea or vomiting.     ranolazine (RANEXA) 1000 MG SR tablet Take 500 mg by mouth 2 (two) times daily.      temazepam (RESTORIL) 30 MG capsule Take 30 mg by mouth at bedtime.      traZODone (DESYREL) 50 MG tablet Take 50 mg by mouth at  bedtime.      No current facility-administered medications for this visit.   Facility-Administered Medications Ordered in Other Visits  Medication Dose Route Frequency Provider Last Rate Last Admin   sodium chloride flush (NS) 0.9 % injection 3 mL  3 mL Intravenous Q12H Neoma Laming A, MD        OBJECTIVE: Vitals:   04/13/20 1050  BP: 109/74  Pulse: 64  Resp: 20  Temp: 97.6 F (36.4 C)  SpO2: 100%     Body mass index is 28.54 kg/m.    ECOG FS:0 - Asymptomatic  General: Well-developed, well-nourished, no acute distress. Eyes: Pink conjunctiva, anicteric sclera. HEENT: Normocephalic, moist mucous membranes. Breast: Exam deferred today. Lungs: No audible wheezing or coughing. Heart: Regular rate and rhythm. Abdomen: Soft, nontender, no obvious distention. Musculoskeletal: No edema, cyanosis, or clubbing. Neuro: Alert, answering all questions appropriately. Cranial nerves grossly intact. Skin: No rashes or petechiae noted. Psych: Normal affect.  LAB RESULTS:  Lab Results  Component Value Date   NA 127 (L) 04/02/2020   K 4.0 04/02/2020   CL 91 (L) 04/02/2020   CO2 26 04/02/2020   GLUCOSE 102 (H) 04/02/2020   BUN 12 04/02/2020   CREATININE 1.38 (H) 04/02/2020   CALCIUM 9.6 04/02/2020   PROT 6.0 (L) 12/22/2016   ALBUMIN 3.3 (L) 12/22/2016   AST 18 12/22/2016   ALT 15 12/22/2016   ALKPHOS 76 12/22/2016   BILITOT 0.8 12/22/2016   GFRNONAA 39 (L) 04/02/2020   GFRAA 45 (L) 04/02/2020    Lab Results  Component Value Date   WBC 5.6 04/02/2020   NEUTROABS 5.1 11/02/2016   HGB 10.9 (L) 04/02/2020   HCT 31.3 (L) 04/02/2020   MCV 89.4 04/02/2020   PLT 320 04/02/2020     STUDIES: CARDIAC CATHETERIZATION  Result Date: 04/02/2020  1st Mrg lesion is 15% stenosed.  Prox LAD to Mid LAD lesion is 40% stenosed.  Ost LAD to Prox LAD lesion is 35% stenosed.  Stent in OM patent mild to moderate  mid LAD disease, treat medically.    ASSESSMENT: Pathologic stage IIIa  triple positive invasive carcinoma of the lower inner quadrant of the right breast.   PLAN:    1. Pathologic stage IIIa triple positive invasive carcinoma of the lower inner quadrant of the right breast: Patient has now completed all of her treatments including XRT. She only completed 14 of 18 infusions of Herceptin secondary to persistent nausea. Her CA 27.29 continues to be within normal limits.  Patient initiated letrozole in October 2015, but given her high stage and high risk of recurrence she has agreed to continue letrozole for a minimum of 7 years completing in October 2022.  Her most recent mammogram on January 13, 2020 was reported as BI-RADS 1.  Repeat in June 2022.  Return to clinic in 6 months for routine evaluation. 2. Osteoporosis: Bone mineral density from January 13, 2020 reported T score of -2.7 which is unchanged from 1 year prior.  Patient states she discontinued Fosamax, but agreed to reinitiate treatment.  Continue calcium and vitamin D supplementation.  Repeat in June 2022.  3.  Anal fissure: Resolved. 4.  Right flank pain: Resolved. 5.  Nausea: Chronic and unchanged.  Continue monitoring and treatment per primary care.  I spent a total of 20 minutes reviewing chart data, face-to-face evaluation with the patient, counseling and coordination of care as detailed above.   Patient expressed understanding and was in agreement with this plan. She also understands that She can call clinic at any time with any questions, concerns, or complaints.   Breast cancer of lower-inner quadrant of right female breast   Staging form: Breast, AJCC 7th Edition     Pathologic stage from 02/24/2015: Stage IIIA (T1c, N2b, cM0) - Signed by Lloyd Huger, MD on 02/24/2015   Lloyd Huger, MD 04/14/20 10:14 AM

## 2020-04-13 ENCOUNTER — Other Ambulatory Visit: Payer: Self-pay

## 2020-04-13 ENCOUNTER — Encounter: Payer: Self-pay | Admitting: Oncology

## 2020-04-13 ENCOUNTER — Inpatient Hospital Stay: Payer: Medicare Other | Attending: Oncology | Admitting: Oncology

## 2020-04-13 VITALS — BP 109/74 | HR 64 | Temp 97.6°F | Resp 20 | Wt 176.8 lb

## 2020-04-13 DIAGNOSIS — M818 Other osteoporosis without current pathological fracture: Secondary | ICD-10-CM | POA: Diagnosis not present

## 2020-04-13 DIAGNOSIS — Z79811 Long term (current) use of aromatase inhibitors: Secondary | ICD-10-CM | POA: Insufficient documentation

## 2020-04-13 DIAGNOSIS — R110 Nausea: Secondary | ICD-10-CM | POA: Diagnosis not present

## 2020-04-13 DIAGNOSIS — Z170 Estrogen receptor positive status [ER+]: Secondary | ICD-10-CM | POA: Insufficient documentation

## 2020-04-13 DIAGNOSIS — C50311 Malignant neoplasm of lower-inner quadrant of right female breast: Secondary | ICD-10-CM | POA: Diagnosis not present

## 2020-04-13 DIAGNOSIS — Z87891 Personal history of nicotine dependence: Secondary | ICD-10-CM | POA: Diagnosis not present

## 2020-04-13 NOTE — Progress Notes (Signed)
Patient denies any pain or concerns today at follow up appointment. Just would like to get results.

## 2020-08-24 ENCOUNTER — Telehealth: Payer: Self-pay

## 2020-08-24 NOTE — Telephone Encounter (Signed)
Patient has requested to have an office visit scheduled due to stomach issues. She has seen Dr. Vicente Males in the past (2018).  There is a referral in Epic for her to be seen for Nausea, GERD.  Please contact to schedule an appt.  Thanks,  [place], [place]

## 2020-08-25 ENCOUNTER — Encounter: Payer: Self-pay | Admitting: Nurse Practitioner

## 2020-08-30 ENCOUNTER — Encounter: Payer: Self-pay | Admitting: Gastroenterology

## 2020-08-30 ENCOUNTER — Ambulatory Visit: Payer: Medicare HMO | Admitting: Gastroenterology

## 2020-08-30 ENCOUNTER — Other Ambulatory Visit: Payer: Self-pay

## 2020-08-30 VITALS — BP 107/72 | HR 66 | Wt 174.2 lb

## 2020-08-30 DIAGNOSIS — K219 Gastro-esophageal reflux disease without esophagitis: Secondary | ICD-10-CM

## 2020-08-30 DIAGNOSIS — K580 Irritable bowel syndrome with diarrhea: Secondary | ICD-10-CM | POA: Diagnosis not present

## 2020-08-30 NOTE — Patient Instructions (Signed)
High-Fiber Eating Plan Fiber, also called dietary fiber, is a type of carbohydrate. It is found foods such as fruits, vegetables, whole grains, and beans. A high-fiber diet can have many health benefits. Your health care provider may recommend a high-fiber diet to help:  Prevent constipation. Fiber can make your bowel movements more regular.  Lower your cholesterol.  Relieve the following conditions: ? Inflammation of veins in the anus (hemorrhoids). ? Inflammation of specific areas of the digestive tract (uncomplicated diverticulosis). ? A problem of the large intestine, also called the colon, that sometimes causes pain and diarrhea (irritable bowel syndrome, or IBS).  Prevent overeating as part of a weight-loss plan.  Prevent heart disease, type 2 diabetes, and certain cancers. What are tips for following this plan? Reading food labels  Check the nutrition facts label on food products for the amount of dietary fiber. Choose foods that have 5 grams of fiber or more per serving.  The goals for recommended daily fiber intake include: ? Men (age 50 or younger): 34-38 g. ? Men (over age 50): 28-34 g. ? Women (age 50 or younger): 25-28 g. ? Women (over age 50): 22-25 g. Your daily fiber goal is _____________ g.   Shopping  Choose whole fruits and vegetables instead of processed forms, such as apple juice or applesauce.  Choose a wide variety of high-fiber foods such as avocados, lentils, oats, and kidney beans.  Read the nutrition facts label of the foods you choose. Be aware of foods with added fiber. These foods often have high sugar and sodium amounts per serving. Cooking  Use whole-grain flour for baking and cooking.  Cook with brown rice instead of white rice. Meal planning  Start the day with a breakfast that is high in fiber, such as a cereal that contains 5 g of fiber or more per serving.  Eat breads and cereals that are made with whole-grain flour instead of refined  flour or white flour.  Eat brown rice, bulgur wheat, or millet instead of white rice.  Use beans in place of meat in soups, salads, and pasta dishes.  Be sure that half of the grains you eat each day are whole grains. General information  You can get the recommended daily intake of dietary fiber by: ? Eating a variety of fruits, vegetables, grains, nuts, and beans. ? Taking a fiber supplement if you are not able to take in enough fiber in your diet. It is better to get fiber through food than from a supplement.  Gradually increase how much fiber you consume. If you increase your intake of dietary fiber too quickly, you may have bloating, cramping, or gas.  Drink plenty of water to help you digest fiber.  Choose high-fiber snacks, such as berries, raw vegetables, nuts, and popcorn. What foods should I eat? Fruits Berries. Pears. Apples. Oranges. Avocado. Prunes and raisins. Dried figs. Vegetables Sweet potatoes. Spinach. Kale. Artichokes. Cabbage. Broccoli. Cauliflower. Green peas. Carrots. Squash. Grains Whole-grain breads. Multigrain cereal. Oats and oatmeal. Brown rice. Barley. Bulgur wheat. Millet. Quinoa. Bran muffins. Popcorn. Rye wafer crackers. Meats and other proteins Navy beans, kidney beans, and pinto beans. Soybeans. Split peas. Lentils. Nuts and seeds. Dairy Fiber-fortified yogurt. Beverages Fiber-fortified soy milk. Fiber-fortified orange juice. Other foods Fiber bars. The items listed above may not be a complete list of recommended foods and beverages. Contact a dietitian for more information. What foods should I avoid? Fruits Fruit juice. Cooked, strained fruit. Vegetables Fried potatoes. Canned vegetables. Well-cooked vegetables. Grains   White bread. Pasta made with refined flour. White rice. Meats and other proteins Fatty cuts of meat. Fried chicken or fried fish. Dairy Milk. Yogurt. Cream cheese. Sour cream. Fats and oils Butters. Beverages Soft  drinks. Other foods Cakes and pastries. The items listed above may not be a complete list of foods and beverages to avoid. Talk with your dietitian about what choices are best for you. Summary  Fiber is a type of carbohydrate. It is found in foods such as fruits, vegetables, whole grains, and beans.  A high-fiber diet has many benefits. It can help to prevent constipation, lower blood cholesterol, aid weight loss, and reduce your risk of heart disease, diabetes, and certain cancers.  Increase your intake of fiber gradually. Increasing fiber too quickly may cause cramping, bloating, and gas. Drink plenty of water while you increase the amount of fiber you consume.  The best sources of fiber include whole fruits and vegetables, whole grains, nuts, seeds, and beans. This information is not intended to replace advice given to you by your health care provider. Make sure you discuss any questions you have with your health care provider. Document Revised: 11/06/2019 Document Reviewed: 11/06/2019 Elsevier Patient Education  2021 [employer]  

## 2020-08-30 NOTE — Progress Notes (Signed)
[name] MD, MRCP(U.K) [address]  [place]  [place], [place] [address]  Main: [phone]  Fax: [phone]   Gastroenterology Consultation  Referring Provider:     Danelle Berry, NP Primary Care Physician:  Perrin Maltese, MD Primary Gastroenterologist:  Dr. [name]  Reason for Consultation:     Reflux and nausea        HPI:   Abigail Ortega is a 70 y.o. y/o female referred for consultation & management  by Dr. Perrin Maltese, MD.     Summary of history :  She was previously seen by myself in April 2018 for nausea and hemorrhoids.  Issue has been ongoing since 2015.  She has a personal history of colon polyps and a family history of colon cancer.  When she was seen by me in 2018 had nausea which resolved by eating and she never threw up.  Had pain during defecation.  I obtained a CT scan of the abdomen in May 2018 which showed a small hiatal hernia and stool in the right, transverse colon and splenic flexure.  Colonic diverticulosis also noted.  12/23/2016: Colonoscopy performed by Dr. Michail Sermon showed internal hemorrhoids as well external hemorrhoids.  2 mm polyp resected in the cecum.  Colon prep was fair.  Diverticulosis of the colon was noted.  Upper endoscopy in May 2018 5 cm hiatal hernia was noted.   She was seen by Dr. [name] in March 2020 for the anal fissure treated conservatively with sitz bath and nifedipine cream.  She has had Botox injected in October 2019  Interval history      Since 2018 she continues to have abdominal discomfort relieved by bowel movement.  She has been told she has IBS and started on dicyclomine.  She takes Protonix.  Still has reflux-like symptoms.  These have been ongoing for over 7 years.  She has nausea but no vomiting.  She takes Zofran.  She states she has multiple bowel movements a day soft in consistency but not watery.  Denies any constipation.  Denies any anal pain.  Good  Past Medical History:  Diagnosis Date  .  Anxiety   . Arthritis   . Breast cancer [facility]) 2014   Right breast cancer - chemo, radiation and Mastectomy  . CHF (congestive heart failure) ([place]) 2013  . Chronic kidney disease   . Clotting disorder ([place])    blood clots  . Depression   . GERD (gastroesophageal reflux disease)   . GI bleed   . H/O heart artery stent   . Headache   . Heart attack [facility]) 2012   coronary stent x2 completed by Dr. Clayborn Bigness.  Marland Kitchen Heart attack ([place]) Apr 29, 2013  . Hemorrhoids   . Hypertension   . Hypothyroidism   . IBS (irritable bowel syndrome)   . Malignant neoplasm of lower-inner quadrant of female breast [facility]) January 20, 2013.   invasive mammary cancer, minimal 0.85 cm.  Histologic grade 1.  . Personal history of chemotherapy 2014   BREAST CA  . Personal history of radiation therapy 2014   BREAST CA  . Thyroid disease     Past Surgical History:  Procedure Laterality Date  . BOTOX INJECTION N/A 03/14/2017   Procedure: BOTOX INJECTION;  Surgeon: Jules Husbands, MD;  Location: [facility] ORS;  Service: General;  Laterality: N/A;  . BOTOX INJECTION N/A 05/09/2018   Procedure: BOTOX INJECTION;  Surgeon: Jules Husbands, MD;  Location: [facility] ORS;  Service: General;  Laterality: N/A;  . BREAST BIOPSY Right 2014   positive  . BREAST SURGERY Right 02-21-2013   right mastectomy  . CARDIAC CATHETERIZATION    . COLONOSCOPY W/ BIOPSIES  09/15/2010   Colonoscopy completed by Verdie Shire, M.D. Tubular adenoma of the ascending colon and descending colon reported up to 0.4 cm in diameter. No atypia.  . COLONOSCOPY WITH PROPOFOL N/A 12/23/2016   Procedure: COLONOSCOPY WITH PROPOFOL;  Surgeon: Wilford Corner, MD;  Location: [facility] ENDOSCOPY;  Service: Endoscopy;  Laterality: N/A;  . ESOPHAGOGASTRODUODENOSCOPY N/A 12/13/2016   Procedure: ESOPHAGOGASTRODUODENOSCOPY (EGD);  Surgeon: [name], MD;  Location: [facility] ENDOSCOPY;  Service: Endoscopy;  Laterality: N/A;  . LEFT HEART CATH N/A 04/02/2020   Procedure: Left Heart Cath with  Coronary Angiography;  Surgeon: Dionisio David, MD;  Location: [place] CV LAB;  Service: Cardiovascular;  Laterality: N/A;  . LEFT HEART CATH AND CORONARY ANGIOGRAPHY Left 08/29/2018   Procedure: LEFT HEART CATH AND CORONARY ANGIOGRAPHY;  Surgeon: Dionisio David, MD;  Location: [place] CV LAB;  Service: Cardiovascular;  Laterality: Left;  Marland Kitchen MASTECTOMY Right 2014   BREAST CA  . PORTA CATH INSERTION    . SPHINCTEROTOMY N/A 05/09/2018   Procedure: SPHINCTEROTOMY;  Surgeon: Jules Husbands, MD;  Location: [facility] ORS;  Service: General;  Laterality: N/A;  . UPPER GI ENDOSCOPY  09/15/2010     Completed by Verdie Shire, M.D. for nausea. Normal exam reported.  Marland Kitchen VASCULAR SURGERY      Prior to Admission medications   Medication Sig Start Date End Date Taking? Authorizing Provider  acetaminophen (TYLENOL 8 HOUR ARTHRITIS PAIN) 650 MG CR tablet Take 1,300 mg by mouth every 8 (eight) hours as needed for pain.     [provider]  alendronate (FOSAMAX) 70 MG tablet TAKE 1 TABLET BY MOUTH ONCE A WEEK. TAKE WITH A FULL GLASS OF WATER ON AN EMPTY STOMACH. 11/15/18   Lloyd Huger, MD  ALPRAZolam Duanne Moron) 0.25 MG tablet Take 0.25 mg by mouth 2 (two) times daily as needed for anxiety.  04/23/15   [provider]  atorvastatin (LIPITOR) 40 MG tablet Take 80 mg by mouth daily.  06/08/15   [provider]  azelastine (ASTELIN) 0.1 % nasal spray Place 1 spray into both nostrils 2 (two) times daily as needed for rhinitis.  04/10/17   [provider]  clopidogrel (PLAVIX) 75 MG tablet Take 75 mg by mouth daily.     [provider]  [employer] ALLERGY RELIEF 4 MG tablet Take 4 mg by mouth every 12 (twelve) hours as needed for allergies.  05/19/18   [provider]  dicyclomine (BENTYL) 10 MG capsule Take 20 mg by mouth every 8 (eight) hours as needed. 05/28/19   [provider]  diphenhydrAMINE (BENADRYL) 25 mg capsule Take     as needed    [provider]  fexofenadine (ALLEGRA) 60 MG tablet Take 30 mg by mouth 2 (two) times daily as needed (allergies.).     [provider]  gabapentin (NEURONTIN) 300 MG capsule Take 1 capsule (300 mg total) by mouth 3 (three) times daily. 04/05/17   Pabon, Bassett, MD  hydrALAZINE (APRESOLINE) 25 MG tablet Take 25 mg by mouth 3 (three) times daily.     [provider]  hydrocortisone (ANUSOL-HC) 25 MG suppository Place 1 suppository (25 mg total) rectally as needed for hemorrhoids or anal itching. 04/07/19   Pabon, Marjory Lies, MD  isosorbide  mononitrate (IMDUR) 30 MG 24 hr tablet Take 30 mg by mouth daily with breakfast.    [provider]  letrozole (Enigma) 2.5 MG tablet TAKE 1 TABLET BY MOUTH  DAILY 02/18/20   Lloyd Huger, MD  levothyroxine (SYNTHROID, LEVOTHROID) 88 MCG tablet Take 88 mcg by mouth daily before breakfast.  05/20/18   [provider]  Magnesium 250 MG TABS Take by mouth.     [provider]  meclizine (ANTIVERT) 25 MG tablet Take 2 tablets (50 mg total) by mouth 3 (three) times daily as needed for dizziness or nausea. 09/28/17   Carrie Mew, MD  montelukast (SINGULAIR) 10 MG tablet Take 10 mg by mouth at bedtime.    [provider]  nystatin (MYCOSTATIN/NYSTOP) powder Apply 1 g topically 3 (three) times daily as needed (for rash).  12/26/17   [provider]  Olopatadine HCl 0.2 % SOLN Place 1 drop into both eyes daily as needed (for allergies).  12/19/16   [provider]  ondansetron (ZOFRAN) 4 MG tablet  02/27/19   [provider]  pantoprazole (PROTONIX) 40 MG tablet TAKE 1 TABLET BY MOUTH  DAILY Patient taking differently: Take 40 mg by mouth daily.  05/03/17   Lloyd Huger, MD  promethazine (PHENERGAN) 12.5 MG tablet Take 12.5 mg by mouth every 6 (six) hours as needed for nausea or vomiting.    [provider]  ranolazine (RANEXA) 1000 MG SR tablet Take 500 mg by mouth 2 (two)  times daily.     [provider]  temazepam (RESTORIL) 30 MG capsule Take 30 mg by mouth at bedtime.  02/04/17   [provider]  traZODone (DESYREL) 50 MG tablet Take 50 mg by mouth at bedtime.  10/05/14   [provider]    Family History  Problem Relation Age of Onset  . Breast cancer Mother 64  . Cancer Father        colon     Social History   Tobacco Use  . Smoking status: Former Smoker    Years: 20.00  . Smokeless tobacco: Never Used  Vaping Use  . Vaping Use: Never used  Substance Use Topics  . Alcohol use: No  . Drug use: No    Allergies as of 08/30/2020  . (No Known Allergies)    Review of Systems:    All systems reviewed and negative except where noted in HPI.   Physical Exam:  There were no vitals taken for this visit. No LMP recorded. Patient is postmenopausal. Psych:  Alert and cooperative. Normal mood and affect. General:   Alert,  Well-developed, well-nourished, pleasant and cooperative in NAD Head:  Normocephalic and atraumatic. Eyes:  Sclera clear, no icterus.   Conjunctiva pink. Lungs:  Respirations even and unlabored.  Clear throughout to auscultation.   No wheezes, crackles, or rhonchi. No acute distress. Heart:  Regular rate and rhythm; no murmurs, clicks, rubs, or gallops. Abdomen:  Normal bowel sounds.  No bruits.  Soft, non-tender and non-distended without masses, hepatosplenomegaly or hernias noted.  No guarding or rebound tenderness.    Msk:  Symmetrical without gross deformities. Good, equal movement & strength bilaterally. Pulses:  Normal pulses noted. Extremities:  No clubbing or edema.  No cyanosis. Neurologic:  Alert and oriented x3;  grossly normal neurologically. Psych:  Alert and cooperative. Normal mood and affect.  Imaging Studies: No results found.  Assessment and Plan:   Abigail Ortega is a 70 y.o. y/o female  has been referred for back to see me for GERD and IBS-like symptoms.  She has a large  hiatal hernia noted on endoscopy in the past.  It is possible that her upper GI symptoms are due to acid reflux.  In addition she has nonspecific lower GI symptoms with loose stools on and off.  Very likely she has IBS.  Plan 1.  High-fiber diet 2.  Continue dicyclomine and will add IBgard. 3.  Stop pantoprazole and commence on Dexilant.  Samples have been provided for 2 weeks. 4.  If above does not work we will consider a course of Xifaxan for IBS diarrhea. 5.  Explained to the patient that she has had the symptoms for over 7 or 8 years and it may take a while for [place] to figure out which form of treatment works best for her.  Follow up in 3 weeks  Dr [name] MD,MRCP(U.K)

## 2020-09-06 ENCOUNTER — Telehealth: Payer: Self-pay | Admitting: Gastroenterology

## 2020-09-06 NOTE — Telephone Encounter (Signed)
Patient Abigail Ortega that she needs to speak to Dr Georgeann Oppenheim nurse about her medication. Please call to advise

## 2020-09-08 ENCOUNTER — Telehealth: Payer: Self-pay | Admitting: Gastroenterology

## 2020-09-08 DIAGNOSIS — N2581 Secondary hyperparathyroidism of renal origin: Secondary | ICD-10-CM

## 2020-09-08 HISTORY — DX: Secondary hyperparathyroidism of renal origin: N25.81

## 2020-09-08 NOTE — Telephone Encounter (Signed)
Abigail Ortega w/ [facility] called and needs name of samples for IBS and Reflux and treatment plan. Please call her at [phone].

## 2020-09-08 NOTE — Telephone Encounter (Signed)
Returned pt's call regarding her medication. Unable to contact

## 2020-09-09 ENCOUNTER — Telehealth: Payer: Self-pay | Admitting: Gastroenterology

## 2020-09-09 NOTE — Telephone Encounter (Signed)
Patient called asking for more samples Fedgard &  Dexilant  to hold her over  until she hasher appointment.

## 2020-10-04 NOTE — Telephone Encounter (Signed)
Second call.  Patient came into office to ask about samples she was given.  Patient has appointment October 21, 2020.  Patient wants to know from Doctor if he  recommends calling in a prescription for her until October 21, 2020 when she will be seen in office. Please call patient to advise

## 2020-10-05 MED ORDER — DEXLANSOPRAZOLE 60 MG PO CPDR: 60.0000 mg | DELAYED_RELEASE_CAPSULE | Freq: Every day | ORAL | 1 refills | Status: DC

## 2020-10-05 NOTE — Addendum Note (Signed)
Addended by: Dorethea Clan on: 10/05/2020 09:42 AM   Modules accepted: Orders

## 2020-10-07 NOTE — Progress Notes (Deleted)
[place]  [phone] [phone]  ID: Durwin Reges OB: July 17, 1951  MR#: [id number]  HGD#:[id number]  Patient Care Team: Perrin Maltese, MD as PCP - General (Internal Medicine) Bary Castilla, Forest Gleason, MD (General Surgery) Sharene Butters, MD (General Surgery)  CHIEF COMPLAINT: Pathologic stage IIIa triple positive invasive carcinoma of the lower inner quadrant of the right breast.   INTERVAL HISTORY: Patient returns to clinic today for routine 58-month evaluation.  She currently feels well and is asymptomatic. She continues to have chronic nausea that has been unchanged for years.  She continues to tolerate letrozole without significant side effects.  She has no neurologic complaints. She denies any recent fevers or illnesses.  She denies any chest pain, shortness of breath, cough, or hemoptysis.  She denies any vomiting, constipation, or diarrhea. She has no urinary complaints.  Patient offers no further specific complaints today.  REVIEW OF SYSTEMS:   Review of Systems  Constitutional: Negative.  Negative for chills, fever, malaise/fatigue and weight loss.  HENT: Negative.  Negative for tinnitus.   Eyes: Negative.   Respiratory: Negative.  Negative for cough and sputum production.   Cardiovascular: Negative.  Negative for chest pain and leg swelling.  Gastrointestinal: Positive for nausea. Negative for abdominal pain, constipation, diarrhea and vomiting.  Genitourinary: Negative.  Negative for dysuria and flank pain.  Musculoskeletal: Negative.  Negative for myalgias.  Skin: Negative.  Negative for rash.  Neurological: Negative.  Negative for sensory change, weakness and headaches.  Psychiatric/Behavioral: Negative.  The patient is not nervous/anxious and does not have insomnia.     As per HPI. Otherwise, a complete review of systems is negative.  PAST MEDICAL HISTORY: Past Medical History:  Diagnosis Date  . Anxiety   . Arthritis   .  Breast cancer [facility]) 2014   Right breast cancer - chemo, radiation and Mastectomy  . CHF (congestive heart failure) ([place]) 2013  . Chronic kidney disease   . Clotting disorder ([place])    blood clots  . Depression   . GERD (gastroesophageal reflux disease)   . GI bleed   . H/O heart artery stent   . Headache   . Heart attack [facility]) 2012   coronary stent x2 completed by Dr. Clayborn Bigness.  Marland Kitchen Heart attack ([place]) Apr 29, 2013  . Hemorrhoids   . Hypertension   . Hypothyroidism   . IBS (irritable bowel syndrome)   . Malignant neoplasm of lower-inner quadrant of female breast [facility]) January 20, 2013.   invasive mammary cancer, minimal 0.85 cm.  Histologic grade 1.  . Personal history of chemotherapy 2014   BREAST CA  . Personal history of radiation therapy 2014   BREAST CA  . Thyroid disease     PAST SURGICAL HISTORY: Past Surgical History:  Procedure Laterality Date  . BOTOX INJECTION N/A 03/14/2017   Procedure: BOTOX INJECTION;  Surgeon: Jules Husbands, MD;  Location: [facility] ORS;  Service: General;  Laterality: N/A;  . BOTOX INJECTION N/A 05/09/2018   Procedure: BOTOX INJECTION;  Surgeon: Jules Husbands, MD;  Location: [facility] ORS;  Service: General;  Laterality: N/A;  . BREAST BIOPSY Right 2014   positive  . BREAST SURGERY Right 02-21-2013   right mastectomy  . CARDIAC CATHETERIZATION    . COLONOSCOPY W/ BIOPSIES  09/15/2010   Colonoscopy completed by Verdie Shire, M.D. Tubular adenoma of the ascending colon and descending colon reported up to 0.4 cm in diameter. No atypia.  . COLONOSCOPY WITH PROPOFOL N/A  12/23/2016   Procedure: COLONOSCOPY WITH PROPOFOL;  Surgeon: Wilford Corner, MD;  Location: [facility] ENDOSCOPY;  Service: Endoscopy;  Laterality: N/A;  . ESOPHAGOGASTRODUODENOSCOPY N/A 12/13/2016   Procedure: ESOPHAGOGASTRODUODENOSCOPY (EGD);  Surgeon: [name], MD;  Location: [facility] ENDOSCOPY;  Service: Endoscopy;  Laterality: N/A;  . LEFT HEART CATH N/A 04/02/2020   Procedure: Left Heart Cath with  Coronary Angiography;  Surgeon: Dionisio David, MD;  Location: [place] CV LAB;  Service: Cardiovascular;  Laterality: N/A;  . LEFT HEART CATH AND CORONARY ANGIOGRAPHY Left 08/29/2018   Procedure: LEFT HEART CATH AND CORONARY ANGIOGRAPHY;  Surgeon: Dionisio David, MD;  Location: [place] CV LAB;  Service: Cardiovascular;  Laterality: Left;  Marland Kitchen MASTECTOMY Right 2014   BREAST CA  . PORTA CATH INSERTION    . SPHINCTEROTOMY N/A 05/09/2018   Procedure: SPHINCTEROTOMY;  Surgeon: Jules Husbands, MD;  Location: [facility] ORS;  Service: General;  Laterality: N/A;  . UPPER GI ENDOSCOPY  09/15/2010     Completed by Verdie Shire, M.D. for nausea. Normal exam reported.  Marland Kitchen VASCULAR SURGERY      FAMILY HISTORY Family History  Problem Relation Age of Onset  . Breast cancer Mother 44  . Cancer Father        colon       ADVANCED DIRECTIVES:    HEALTH MAINTENANCE: Social History   Tobacco Use  . Smoking status: Former Smoker    Years: 20.00  . Smokeless tobacco: Never Used  Vaping Use  . Vaping Use: Never used  Substance Use Topics  . Alcohol use: No  . Drug use: No     Colonoscopy:  PAP:  Bone density:  Lipid panel:  No Known Allergies  Current Outpatient Medications  Medication Sig Dispense Refill  . acetaminophen (TYLENOL) 650 MG CR tablet Take 1,300 mg by mouth every 8 (eight) hours as needed for pain.  (Patient not taking: Reported on 08/30/2020)    . alendronate (FOSAMAX) 70 MG tablet TAKE 1 TABLET BY MOUTH ONCE A WEEK. TAKE WITH A FULL GLASS OF WATER ON AN EMPTY STOMACH. (Patient not taking: Reported on 08/30/2020) 12 tablet 3  . ALPRAZolam (XANAX) 0.25 MG tablet Take 0.25 mg by mouth 2 (two) times daily as needed for anxiety.   2  . atorvastatin (LIPITOR) 40 MG tablet Take 80 mg by mouth daily.     Marland Kitchen azelastine (ASTELIN) 0.1 % nasal spray Place 1 spray into both nostrils 2 (two) times daily as needed for rhinitis.  (Patient not taking: Reported on 08/30/2020)  4  . clopidogrel  (PLAVIX) 75 MG tablet Take 75 mg by mouth daily.     . [employer] ALLERGY RELIEF 4 MG tablet Take 4 mg by mouth every 12 (twelve) hours as needed for allergies.  (Patient not taking: Reported on 08/30/2020)  3  . dexlansoprazole (DEXILANT) 60 MG capsule Take 1 capsule (60 mg total) by mouth daily. 90 capsule 1  . dicyclomine (BENTYL) 10 MG capsule Take 20 mg by mouth every 8 (eight) hours as needed.    . diphenhydrAMINE (BENADRYL) 25 mg capsule Take     as needed (Patient not taking: Reported on 08/30/2020)    . fexofenadine (ALLEGRA) 60 MG tablet Take 30 mg by mouth 2 (two) times daily as needed (allergies.).  (Patient not taking: Reported on 08/30/2020)    . gabapentin (NEURONTIN) 300 MG capsule Take 1 capsule (300 mg total) by mouth 3 (three) times daily. 90 capsule 0  . hydrALAZINE (APRESOLINE)  25 MG tablet Take 25 mg by mouth 3 (three) times daily.  (Patient not taking: Reported on 08/30/2020)    . hydrocortisone (ANUSOL-HC) 25 MG suppository Place 1 suppository (25 mg total) rectally as needed for hemorrhoids or anal itching. (Patient not taking: Reported on 08/30/2020) 12 suppository 1  . isosorbide mononitrate (IMDUR) 30 MG 24 hr tablet Take 30 mg by mouth daily with breakfast.    . letrozole (FEMARA) 2.5 MG tablet TAKE 1 TABLET BY MOUTH  DAILY 90 tablet 3  . levothyroxine (SYNTHROID, LEVOTHROID) 88 MCG tablet Take 88 mcg by mouth daily before breakfast.     . Magnesium 250 MG TABS Take by mouth.  (Patient not taking: Reported on 08/30/2020)    . meclizine (ANTIVERT) 25 MG tablet Take 2 tablets (50 mg total) by mouth 3 (three) times daily as needed for dizziness or nausea. (Patient not taking: Reported on 08/30/2020) 30 tablet 1  . montelukast (SINGULAIR) 10 MG tablet Take 10 mg by mouth at bedtime. (Patient not taking: Reported on 08/30/2020)    . nystatin (MYCOSTATIN/NYSTOP) powder Apply 1 g topically 3 (three) times daily as needed (for rash).  (Patient not taking: Reported on 08/30/2020)  1  .  Olopatadine HCl 0.2 % SOLN Place 1 drop into both eyes daily as needed (for allergies).  (Patient not taking: Reported on 08/30/2020)  2  . ondansetron (ZOFRAN) 4 MG tablet     . promethazine (PHENERGAN) 12.5 MG tablet Take 12.5 mg by mouth every 6 (six) hours as needed for nausea or vomiting.    . ranolazine (RANEXA) 1000 MG SR tablet Take 500 mg by mouth 2 (two) times daily.     . temazepam (RESTORIL) 30 MG capsule Take 30 mg by mouth at bedtime.  (Patient not taking: Reported on 08/30/2020)    . traZODone (DESYREL) 50 MG tablet Take 50 mg by mouth at bedtime.      No current facility-administered medications for this visit.   Facility-Administered Medications Ordered in Other Visits  Medication Dose Route Frequency Provider Last Rate Last Admin  . sodium chloride flush (NS) 0.9 % injection 3 mL  3 mL Intravenous Q12H Dionisio David, MD        OBJECTIVE: There were no vitals filed for this visit.   There is no height or weight on file to calculate BMI.    ECOG FS:0 - Asymptomatic  General: Well-developed, well-nourished, no acute distress. Eyes: Pink conjunctiva, anicteric sclera. HEENT: Normocephalic, moist mucous membranes. Breast: Exam deferred today. Lungs: No audible wheezing or coughing. Heart: Regular rate and rhythm. Abdomen: Soft, nontender, no obvious distention. Musculoskeletal: No edema, cyanosis, or clubbing. Neuro: Alert, answering all questions appropriately. Cranial nerves grossly intact. Skin: No rashes or petechiae noted. Psych: Normal affect.  LAB RESULTS:  Lab Results  Component Value Date   NA 127 (L) 04/02/2020   K 4.0 04/02/2020   CL 91 (L) 04/02/2020   CO2 26 04/02/2020   GLUCOSE 102 (H) 04/02/2020   BUN 12 04/02/2020   CREATININE 1.38 (H) 04/02/2020   CALCIUM 9.6 04/02/2020   PROT 6.0 (L) 12/22/2016   ALBUMIN 3.3 (L) 12/22/2016   AST 18 12/22/2016   ALT 15 12/22/2016   ALKPHOS 76 12/22/2016   BILITOT 0.8 12/22/2016   GFRNONAA 39 (L) 04/02/2020    GFRAA 45 (L) 04/02/2020    Lab Results  Component Value Date   WBC 5.6 04/02/2020   NEUTROABS 5.1 11/02/2016   HGB 10.9 (L) 04/02/2020  HCT 31.3 (L) 04/02/2020   MCV 89.4 04/02/2020   PLT 320 04/02/2020     STUDIES: No results found.  ASSESSMENT: Pathologic stage IIIa triple positive invasive carcinoma of the lower inner quadrant of the right breast.   PLAN:    1. Pathologic stage IIIa triple positive invasive carcinoma of the lower inner quadrant of the right breast: Patient has now completed all of her treatments including XRT. She only completed 14 of 18 infusions of Herceptin secondary to persistent nausea. Her CA 27.29 continues to be within normal limits.  Patient initiated letrozole in October 2015, but given her high stage and high risk of recurrence she has agreed to continue letrozole for a minimum of 7 years completing in October 2022.  Her most recent mammogram on January 13, 2020 was reported as BI-RADS 1.  Repeat in June 2022.  Return to clinic in 6 months for routine evaluation. 2. Osteoporosis: Bone mineral density from January 13, 2020 reported T score of -2.7 which is unchanged from 1 year prior.  Patient states she discontinued Fosamax, but agreed to reinitiate treatment.  Continue calcium and vitamin D supplementation.  Repeat in June 2022.  3.  Anal fissure: Resolved. 4.  Right flank pain: Resolved. 5.  Nausea: Chronic and unchanged.  Continue monitoring and treatment per primary care.  I spent a total of 20 minutes reviewing chart data, face-to-face evaluation with the patient, counseling and coordination of care as detailed above.   Patient expressed understanding and was in agreement with this plan. She also understands that She can call clinic at any time with any questions, concerns, or complaints.   Breast cancer of lower-inner quadrant of right female breast   Staging form: Breast, AJCC 7th Edition     Pathologic stage from 02/24/2015: Stage IIIA (T1c, N2b,  cM0) - Signed by Lloyd Huger, MD on 02/24/2015   Lloyd Huger, MD 10/07/20 10:31 PM

## 2020-10-14 ENCOUNTER — Other Ambulatory Visit: Payer: Self-pay | Admitting: *Deleted

## 2020-10-14 ENCOUNTER — Inpatient Hospital Stay: Payer: Medicare HMO | Admitting: Oncology

## 2020-10-14 ENCOUNTER — Telehealth: Payer: Self-pay | Admitting: Oncology

## 2020-10-14 DIAGNOSIS — C50311 Malignant neoplasm of lower-inner quadrant of right female breast: Secondary | ICD-10-CM

## 2020-10-14 NOTE — Progress Notes (Signed)
mm

## 2020-10-14 NOTE — Telephone Encounter (Signed)
Pt left vm to cancel her appointment for 3/31 due to inclement weather. I tried multiple times to reach her however her phones doesn't connect or her mailbox is full.  Will continue to call to reschedule.

## 2020-10-18 ENCOUNTER — Encounter: Payer: Self-pay | Admitting: Nurse Practitioner

## 2020-10-21 ENCOUNTER — Other Ambulatory Visit: Payer: Self-pay

## 2020-10-21 ENCOUNTER — Encounter: Payer: Self-pay | Admitting: Gastroenterology

## 2020-10-21 ENCOUNTER — Ambulatory Visit: Payer: Medicare HMO | Admitting: Gastroenterology

## 2020-10-21 VITALS — BP 121/71 | HR 73 | Wt 160.6 lb

## 2020-10-21 DIAGNOSIS — R1013 Epigastric pain: Secondary | ICD-10-CM

## 2020-10-21 DIAGNOSIS — K219 Gastro-esophageal reflux disease without esophagitis: Secondary | ICD-10-CM | POA: Diagnosis not present

## 2020-10-21 MED ORDER — DEXLANSOPRAZOLE 60 MG PO CPDR: 60.0000 mg | DELAYED_RELEASE_CAPSULE | Freq: Every day | ORAL | 3 refills | Status: DC

## 2020-10-21 MED ORDER — DICYCLOMINE HCL 10 MG PO CAPS: 10.0000 mg | ORAL_CAPSULE | Freq: Two times a day (BID) | ORAL | 3 refills | Status: DC

## 2020-10-21 NOTE — Progress Notes (Signed)
[name] MD, MRCP(U.K) [address]  [place]  [place], [place] [address]  Main: [phone]  Fax: [phone]   Primary Care Physician: Perrin Maltese, MD  Primary Gastroenterologist:  Dr. [name]   Follow-up for GERD and IBS diarrhea   HPI: Abigail Ortega is a 70 y.o. female   Summary of history :  She was previously seen by myself in April 2018 for nausea and hemorrhoids.  Issue has been ongoing since 2015.  She has a personal history of colon polyps and a family history of colon cancer.  When she was seen by me in 2018 had nausea which resolved by eating and she never threw up.  Had pain during defecation.  I obtained a CT scan of the abdomen in May 2018 which showed a small hiatal hernia and stool in the right, transverse colon and splenic flexure.  Colonic diverticulosis also noted.  12/23/2016: Colonoscopy performed by Dr. Michail Sermon showed internal hemorrhoids as well external hemorrhoids.  2 mm polyp resected in the cecum.  Colon prep was fair.  Diverticulosis of the colon was noted.  Upper endoscopy in May 2018 5 cm hiatal hernia was noted.   She was seen by Dr. [name] in March 2020 for the anal fissure treated conservatively with sitz bath and nifedipine cream.  She has had Botox injected in October 2019  Interval history     08/30/2020-10/21/2020 Since she commenced on the [place] after her last visit when we gave her samples, she states that the GERD symptoms completely resolved.  No diarrhea either.  Her only complaint is early morning nausea.  This is a very long standing symptom which she has had for many years.  Fiber makes her symptoms worse.  She is having regular bowel movements.     Current Outpatient Medications  Medication Sig Dispense Refill  . ALPRAZolam (XANAX) 0.25 MG tablet Take 0.25 mg by mouth 2 (two) times daily as needed for anxiety.   2  . atorvastatin (LIPITOR) 40 MG tablet Take 80 mg by mouth daily.     . clopidogrel (PLAVIX)  75 MG tablet Take 75 mg by mouth daily.     Marland Kitchen dexlansoprazole (DEXILANT) 60 MG capsule Take 1 capsule (60 mg total) by mouth daily. 90 capsule 1  . dicyclomine (BENTYL) 10 MG capsule Take 20 mg by mouth every 8 (eight) hours as needed.    . gabapentin (NEURONTIN) 300 MG capsule Take 1 capsule (300 mg total) by mouth 3 (three) times daily. 90 capsule 0  . hydrALAZINE (APRESOLINE) 25 MG tablet Take 25 mg by mouth 3 (three) times daily.    . isosorbide mononitrate (IMDUR) 30 MG 24 hr tablet Take 30 mg by mouth daily with breakfast.    . letrozole (FEMARA) 2.5 MG tablet TAKE 1 TABLET BY MOUTH  DAILY 90 tablet 3  . levothyroxine (SYNTHROID, LEVOTHROID) 88 MCG tablet Take 88 mcg by mouth daily before breakfast.     . ondansetron (ZOFRAN) 4 MG tablet     . promethazine (PHENERGAN) 12.5 MG tablet Take 12.5 mg by mouth every 6 (six) hours as needed for nausea or vomiting.    . ranolazine (RANEXA) 1000 MG SR tablet Take 500 mg by mouth 2 (two) times daily.     . traZODone (DESYREL) 50 MG tablet Take 50 mg by mouth at bedtime.     Marland Kitchen acetaminophen (TYLENOL) 650 MG CR tablet Take 1,300 mg by mouth every 8 (eight) hours as needed  for pain.  (Patient not taking: No sig reported)    . alendronate (FOSAMAX) 70 MG tablet TAKE 1 TABLET BY MOUTH ONCE A WEEK. TAKE WITH A FULL GLASS OF WATER ON AN EMPTY STOMACH. (Patient not taking: No sig reported) 12 tablet 3  . azelastine (ASTELIN) 0.1 % nasal spray Place 1 spray into both nostrils 2 (two) times daily as needed for rhinitis.  (Patient not taking: No sig reported)  4  . [employer] ALLERGY RELIEF 4 MG tablet Take 4 mg by mouth every 12 (twelve) hours as needed for allergies.  (Patient not taking: No sig reported)  3  . diphenhydrAMINE (BENADRYL) 25 mg capsule Take     as needed (Patient not taking: No sig reported)    . fexofenadine (ALLEGRA) 60 MG tablet Take 30 mg by mouth 2 (two) times daily as needed (allergies.).  (Patient not taking: No sig reported)    . hydrocortisone  (ANUSOL-HC) 25 MG suppository Place 1 suppository (25 mg total) rectally as needed for hemorrhoids or anal itching. (Patient not taking: No sig reported) 12 suppository 1  . Magnesium 250 MG TABS Take by mouth.  (Patient not taking: No sig reported)    . meclizine (ANTIVERT) 25 MG tablet Take 2 tablets (50 mg total) by mouth 3 (three) times daily as needed for dizziness or nausea. (Patient not taking: No sig reported) 30 tablet 1  . montelukast (SINGULAIR) 10 MG tablet Take 10 mg by mouth at bedtime. (Patient not taking: No sig reported)    . nystatin (MYCOSTATIN/NYSTOP) powder Apply 1 g topically 3 (three) times daily as needed (for rash).  (Patient not taking: No sig reported)  1  . Olopatadine HCl 0.2 % SOLN Place 1 drop into both eyes daily as needed (for allergies).  (Patient not taking: No sig reported)  2  . temazepam (RESTORIL) 30 MG capsule Take 30 mg by mouth at bedtime.  (Patient not taking: No sig reported)     No current facility-administered medications for this visit.   Facility-Administered Medications Ordered in Other Visits  Medication Dose Route Frequency Provider Last Rate Last Admin  . sodium chloride flush (NS) 0.9 % injection 3 mL  3 mL Intravenous Q12H Dionisio David, MD        Allergies as of 10/21/2020  . (No Known Allergies)    ROS:  General: Negative for anorexia, weight loss, fever, chills, fatigue, weakness. ENT: Negative for hoarseness, difficulty swallowing , nasal congestion. CV: Negative for chest pain, angina, palpitations, dyspnea on exertion, peripheral edema.  Respiratory: Negative for dyspnea at rest, dyspnea on exertion, cough, sputum, wheezing.  GI: See history of present illness. GU:  Negative for dysuria, hematuria, urinary incontinence, urinary frequency, nocturnal urination.  Endo: Negative for unusual weight change.    Physical Examination:   BP 121/71 (BP Location: Right Arm, Patient Position: Sitting, Cuff Size: Large)   Pulse 73   Wt  160 lb 9.6 oz (72.8 kg)   BMI 25.92 kg/m   General: Well-nourished, well-developed in no acute distress.  Eyes: No icterus. Conjunctivae pink. Mouth: Oropharyngeal mucosa moist and pink , no lesions erythema or exudate. Lungs: Clear to auscultation bilaterally. Non-labored. Heart: Regular rate and rhythm, no murmurs rubs or gallops.  Abdomen: Bowel sounds are normal, nontender, nondistended, no hepatosplenomegaly or masses, no abdominal bruits or hernia , no rebound or guarding.   Extremities: No lower extremity edema. No clubbing or deformities. Neuro: Alert and oriented x 3.  Grossly intact. Skin:  Warm and dry, no jaundice.   Psych: Alert and cooperative, normal mood and affect.   Imaging Studies: No results found.  Assessment and Plan:   Abigail Ortega is a 69 y.o. y/o female basically to follow-up for GERD and IBS-like symptoms.    The GERD has completely resolved after commencing on Dexilant.  Continues to have early morning nausea.  I am unsure of the etiology which could be related to the reflux and hiatal hernia.  It is probably going to be hard to obtain resolution of her symptoms and still ongoing for such.  Plan 1.    Continue Dexilant prescription provided 2.  Trial of Bentyl 10 mg twice daily as needed for nausea. 3.  Low FODMAP diet. 4.  If fails could try Levsin instead of Bentyl at next visit.  Dr [name]  MD,MRCP [facility]) Follow up in 2 weeks either video or telephone visit

## 2020-10-31 NOTE — Progress Notes (Signed)
[place]  Telephone:([phone]) [phone] Fax:([phone]) [phone]  ID: Abigail Ortega OB: [birth year]  MR#: [id number]  FGH#:[id number]  Patient Care Team: Perrin Maltese, MD as PCP - General (Internal Medicine) Bary Castilla, Forest Gleason, MD (General Surgery) Sharene Butters, MD (General Surgery)  CHIEF COMPLAINT: Pathologic stage IIIa triple positive invasive carcinoma of the lower inner quadrant of the right breast.   INTERVAL HISTORY: Patient returns to clinic today for routine 51-month evaluation.  She is tolerating letrozole well without significant side effects.  She continues to have chronic nausea that has been unchanged for years and she has close follow-up with GI.  She otherwise feels well.  She has no neurologic complaints. She denies any recent fevers or illnesses.  She denies any chest pain, shortness of breath, cough, or hemoptysis.  She denies any vomiting, constipation, or diarrhea. She has no urinary complaints.  Patient offers no further specific complaints today.  REVIEW OF SYSTEMS:   Review of Systems  Constitutional: Negative.  Negative for chills, fever, malaise/fatigue and weight loss.  HENT: Negative.  Negative for tinnitus.   Eyes: Negative.   Respiratory: Negative.  Negative for cough and sputum production.   Cardiovascular: Negative.  Negative for chest pain and leg swelling.  Gastrointestinal: Positive for nausea. Negative for abdominal pain, constipation, diarrhea and vomiting.  Genitourinary: Negative.  Negative for dysuria and flank pain.  Musculoskeletal: Negative.  Negative for myalgias.  Skin: Negative.  Negative for rash.  Neurological: Negative.  Negative for sensory change, weakness and headaches.  Psychiatric/Behavioral: Negative.  The patient is not nervous/anxious and does not have insomnia.     As per HPI. Otherwise, a complete review of systems is negative.  PAST MEDICAL HISTORY: Past Medical History:  Diagnosis Date  . Anxiety   .  Arthritis   . Breast cancer [facility]) 2014   Right breast cancer - chemo, radiation and Mastectomy  . CHF (congestive heart failure) ([place]) 2013  . Chronic kidney disease   . Clotting disorder ([place])    blood clots  . Depression   . GERD (gastroesophageal reflux disease)   . GI bleed   . H/O heart artery stent   . Headache   . Heart attack [facility]) 2012   coronary stent x2 completed by Dr. Clayborn Bigness.  Marland Kitchen Heart attack ([place]) Apr 29, 2013  . Hemorrhoids   . Hypertension   . Hypothyroidism   . IBS (irritable bowel syndrome)   . Malignant neoplasm of lower-inner quadrant of female breast [facility]) January 20, 2013.   invasive mammary cancer, minimal 0.85 cm.  Histologic grade 1.  . Personal history of chemotherapy 2014   BREAST CA  . Personal history of radiation therapy 2014   BREAST CA  . Thyroid disease     PAST SURGICAL HISTORY: Past Surgical History:  Procedure Laterality Date  . BOTOX INJECTION N/A 03/14/2017   Procedure: BOTOX INJECTION;  Surgeon: Jules Husbands, MD;  Location: [facility] ORS;  Service: General;  Laterality: N/A;  . BOTOX INJECTION N/A 05/09/2018   Procedure: BOTOX INJECTION;  Surgeon: Jules Husbands, MD;  Location: [facility] ORS;  Service: General;  Laterality: N/A;  . BREAST BIOPSY Right 2014   positive  . BREAST SURGERY Right 02-21-2013   right mastectomy  . CARDIAC CATHETERIZATION    . COLONOSCOPY W/ BIOPSIES  09/15/2010   Colonoscopy completed by Verdie Shire, M.D. Tubular adenoma of the ascending colon and descending colon reported up to 0.4 cm in diameter. No atypia.  Marland Kitchen  COLONOSCOPY WITH PROPOFOL N/A 12/23/2016   Procedure: COLONOSCOPY WITH PROPOFOL;  Surgeon: Wilford Corner, MD;  Location: [facility] ENDOSCOPY;  Service: Endoscopy;  Laterality: N/A;  . ESOPHAGOGASTRODUODENOSCOPY N/A 12/13/2016   Procedure: ESOPHAGOGASTRODUODENOSCOPY (EGD);  Surgeon: [name], MD;  Location: [facility] ENDOSCOPY;  Service: Endoscopy;  Laterality: N/A;  . LEFT HEART CATH N/A 04/02/2020   Procedure: Left Heart  Cath with Coronary Angiography;  Surgeon: Dionisio David, MD;  Location: [facility] CV LAB;  Service: Cardiovascular;  Laterality: N/A;  . LEFT HEART CATH AND CORONARY ANGIOGRAPHY Left 08/29/2018   Procedure: LEFT HEART CATH AND CORONARY ANGIOGRAPHY;  Surgeon: Dionisio David, MD;  Location: [facility] CV LAB;  Service: Cardiovascular;  Laterality: Left;  Marland Kitchen MASTECTOMY Right 2014   BREAST CA  . PORTA CATH INSERTION    . SPHINCTEROTOMY N/A 05/09/2018   Procedure: SPHINCTEROTOMY;  Surgeon: Jules Husbands, MD;  Location: [facility] ORS;  Service: General;  Laterality: N/A;  . UPPER GI ENDOSCOPY  09/15/2010     Completed by Verdie Shire, M.D. for nausea. Normal exam reported.  Marland Kitchen VASCULAR SURGERY      FAMILY HISTORY Family History  Problem Relation Age of Onset  . Breast cancer Mother 70  . Cancer Father        colon       ADVANCED DIRECTIVES:    HEALTH MAINTENANCE: Social History   Tobacco Use  . Smoking status: Former Smoker    Years: 20.00  . Smokeless tobacco: Never Used  Vaping Use  . Vaping Use: Never used  Substance Use Topics  . Alcohol use: No  . Drug use: No     Colonoscopy:  PAP:  Bone density:  Lipid panel:  Allergies  Allergen Reactions  . Other     Note: burning, nausea with XR 75 mg    Current Outpatient Medications  Medication Sig Dispense Refill  . acetaminophen (TYLENOL) 650 MG CR tablet Take 1,300 mg by mouth every 8 (eight) hours as needed for pain.    Marland Kitchen alendronate (FOSAMAX) 70 MG tablet TAKE 1 TABLET BY MOUTH ONCE A WEEK. TAKE WITH A FULL GLASS OF WATER ON AN EMPTY STOMACH. 12 tablet 3  . ALPRAZolam (XANAX) 0.25 MG tablet Take 0.25 mg by mouth 2 (two) times daily as needed for anxiety.   2  . atorvastatin (LIPITOR) 40 MG tablet Take 80 mg by mouth daily.     Marland Kitchen azelastine (ASTELIN) 0.1 % nasal spray Place 1 spray into both nostrils 2 (two) times daily as needed for rhinitis.  4  . clopidogrel (PLAVIX) 75 MG tablet Take 75 mg by mouth daily.     .  [employer] ALLERGY RELIEF 4 MG tablet Take 4 mg by mouth every 12 (twelve) hours as needed for allergies.  3  . dexlansoprazole (DEXILANT) 60 MG capsule Take 1 capsule (60 mg total) by mouth daily. 90 capsule 3  . dicyclomine (BENTYL) 10 MG capsule Take 1 capsule (10 mg total) by mouth 2 (two) times daily before a meal. 180 capsule 3  . fexofenadine (ALLEGRA) 60 MG tablet Take 30 mg by mouth 2 (two) times daily as needed (allergies.).    Marland Kitchen gabapentin (NEURONTIN) 300 MG capsule Take 1 capsule (300 mg total) by mouth 3 (three) times daily. 90 capsule 0  . hydrALAZINE (APRESOLINE) 25 MG tablet Take 25 mg by mouth 3 (three) times daily.    . isosorbide mononitrate (IMDUR) 30 MG 24 hr tablet Take 30 mg by mouth daily  with breakfast.    . letrozole (FEMARA) 2.5 MG tablet TAKE 1 TABLET BY MOUTH  DAILY 90 tablet 3  . levothyroxine (SYNTHROID, LEVOTHROID) 88 MCG tablet Take 88 mcg by mouth daily before breakfast.     . Magnesium 250 MG TABS Take by mouth.    . meclizine (ANTIVERT) 25 MG tablet Take 2 tablets (50 mg total) by mouth 3 (three) times daily as needed for dizziness or nausea. 30 tablet 1  . montelukast (SINGULAIR) 10 MG tablet Take 10 mg by mouth at bedtime.    Marland Kitchen nystatin (MYCOSTATIN/NYSTOP) powder Apply 1 g topically 3 (three) times daily as needed (for rash).  1  . Olopatadine HCl 0.2 % SOLN Place 1 drop into both eyes daily as needed (for allergies).  2  . promethazine (PHENERGAN) 12.5 MG tablet Take 12.5 mg by mouth every 6 (six) hours as needed for nausea or vomiting.    . ranolazine (RANEXA) 1000 MG SR tablet Take 500 mg by mouth 2 (two) times daily.     . temazepam (RESTORIL) 30 MG capsule Take 30 mg by mouth at bedtime.    . traZODone (DESYREL) 50 MG tablet Take 50 mg by mouth at bedtime.     . hydrocortisone (ANUSOL-HC) 25 MG suppository Place 1 suppository (25 mg total) rectally as needed for hemorrhoids or anal itching. (Patient not taking: No sig reported) 12 suppository 1  . ondansetron  (ZOFRAN) 4 MG tablet  (Patient not taking: Reported on 11/03/2020)     No current facility-administered medications for this visit.   Facility-Administered Medications Ordered in Other Visits  Medication Dose Route Frequency Provider Last Rate Last Admin  . sodium chloride flush (NS) 0.9 % injection 3 mL  3 mL Intravenous Q12H Neoma Laming A, MD        OBJECTIVE: Vitals:   11/03/20 1020  BP: 102/72  Pulse: 72  Resp: 18  Temp: 97.6 F (36.4 C)  SpO2: 100%     Body mass index is 26.15 kg/m.    ECOG FS:0 - Asymptomatic  General: Well-developed, well-nourished, no acute distress. Eyes: Pink conjunctiva, anicteric sclera. HEENT: Normocephalic, moist mucous membranes. Breasts: Exam deferred today. Lungs: No audible wheezing or coughing. Heart: Regular rate and rhythm. Abdomen: Soft, nontender, no obvious distention. Musculoskeletal: No edema, cyanosis, or clubbing. Neuro: Alert, answering all questions appropriately. Cranial nerves grossly intact. Skin: No rashes or petechiae noted. Psych: Normal affect.  LAB RESULTS:  Lab Results  Component Value Date   NA 127 (L) 04/02/2020   K 4.0 04/02/2020   CL 91 (L) 04/02/2020   CO2 26 04/02/2020   GLUCOSE 102 (H) 04/02/2020   BUN 12 04/02/2020   CREATININE 1.38 (H) 04/02/2020   CALCIUM 9.6 04/02/2020   PROT 6.0 (L) 12/22/2016   ALBUMIN 3.3 (L) 12/22/2016   AST 18 12/22/2016   ALT 15 12/22/2016   ALKPHOS 76 12/22/2016   BILITOT 0.8 12/22/2016   GFRNONAA 39 (L) 04/02/2020   GFRAA 45 (L) 04/02/2020    Lab Results  Component Value Date   WBC 5.6 04/02/2020   NEUTROABS 5.1 11/02/2016   HGB 10.9 (L) 04/02/2020   HCT 31.3 (L) 04/02/2020   MCV 89.4 04/02/2020   PLT 320 04/02/2020     STUDIES: No results found.  ASSESSMENT: Pathologic stage IIIa triple positive invasive carcinoma of the lower inner quadrant of the right breast.   PLAN:    1. Pathologic stage IIIa triple positive invasive carcinoma of the lower inner  quadrant of the right breast: Patient has now completed all of her treatments including XRT. She only completed 14 of 18 infusions of Herceptin secondary to persistent nausea. Her CA 27.29 continues to be within normal limits.  Patient initiated letrozole in October 2015, but given her high stage and high risk of recurrence she has agreed to continue letrozole for a minimum of 7 years completing in October 2022.  Her most recent mammogram on January 13, 2020 was reported as BI-RADS 1.  Repeat mammogram in June 2022.  Return to clinic in 6 months for routine evaluation. 2. Osteoporosis: Bone mineral density from January 13, 2020 reported T score of -2.7 which is unchanged from 1 year prior.  Patient states she never reinitiated Fosamax as requested.  But states she will do so this week.  Continue calcium and vitamin D supplementation.  Repeat in June 2022 along with mammogram as above.   3..  Nausea: Chronic and unchanged.  Patient reports she has an appointment with GI later this week.  Patient expressed understanding and was in agreement with this plan. She also understands that She can call clinic at any time with any questions, concerns, or complaints.   Breast cancer of lower-inner quadrant of right female breast   Staging form: Breast, AJCC 7th Edition     Pathologic stage from 02/24/2015: Stage IIIA (T1c, N2b, cM0) - Signed by Lloyd Huger, MD on 02/24/2015   Lloyd Huger, MD 11/03/20 8:09 PM

## 2020-11-03 ENCOUNTER — Other Ambulatory Visit: Payer: Self-pay

## 2020-11-03 ENCOUNTER — Inpatient Hospital Stay: Payer: Medicare HMO | Attending: Oncology | Admitting: Oncology

## 2020-11-03 ENCOUNTER — Encounter: Payer: Self-pay | Admitting: Oncology

## 2020-11-03 VITALS — BP 102/72 | HR 72 | Temp 97.6°F | Resp 18 | Wt 162.0 lb

## 2020-11-03 DIAGNOSIS — C50311 Malignant neoplasm of lower-inner quadrant of right female breast: Secondary | ICD-10-CM | POA: Insufficient documentation

## 2020-11-03 DIAGNOSIS — Z170 Estrogen receptor positive status [ER+]: Secondary | ICD-10-CM | POA: Insufficient documentation

## 2020-11-03 DIAGNOSIS — R110 Nausea: Secondary | ICD-10-CM | POA: Insufficient documentation

## 2020-11-03 DIAGNOSIS — Z79811 Long term (current) use of aromatase inhibitors: Secondary | ICD-10-CM | POA: Diagnosis not present

## 2020-11-03 DIAGNOSIS — Z87891 Personal history of nicotine dependence: Secondary | ICD-10-CM | POA: Diagnosis not present

## 2020-11-04 ENCOUNTER — Ambulatory Visit: Payer: Medicare HMO | Admitting: Gastroenterology

## 2020-11-04 ENCOUNTER — Other Ambulatory Visit: Payer: Self-pay

## 2020-11-04 ENCOUNTER — Encounter: Payer: Self-pay | Admitting: Gastroenterology

## 2020-11-04 VITALS — BP 130/76 | HR 82 | Ht 66.0 in | Wt 163.4 lb

## 2020-11-04 DIAGNOSIS — K219 Gastro-esophageal reflux disease without esophagitis: Secondary | ICD-10-CM | POA: Diagnosis not present

## 2020-11-04 DIAGNOSIS — K580 Irritable bowel syndrome with diarrhea: Secondary | ICD-10-CM | POA: Diagnosis not present

## 2020-11-04 MED ORDER — DEXLANSOPRAZOLE 60 MG PO CPDR: 60.0000 mg | DELAYED_RELEASE_CAPSULE | Freq: Every day | ORAL | 3 refills | Status: DC

## 2020-11-04 NOTE — Progress Notes (Signed)
[name] MD, MRCP(U.K) [address]  [place]  [place], [place] [address]  Main: [phone]  Fax: [phone]   Primary Care Physician: Perrin Maltese, MD  Primary Gastroenterologist:  Dr. [name]   Follow-up for GERD and IBS diarrhea   HPI: VENIE MONTESINOS is a 70 y.o. female   Summary of history :  She was previously seen by myself in April 2018 for nausea and hemorrhoids. Issue has been ongoing since 2015. She has a personal history of colon polyps and a family history of colon cancer. She was seen by me in 2018 had nausea which resolved by eating and she never threw up. Had pain during defecation. I obtained a CT scan of the abdomen in May 2018 which showed a small hiatal hernia and stool in the right, transverse colon and splenic flexure. Colonic diverticulosis also noted.  12/23/2016: Colonoscopy performed by Dr. Michail Sermon showed internal hemorrhoids as well external hemorrhoids. 2 mm polyp resected in the cecum. Colon prep was fair. Diverticulosis of the colon was noted. Upper endoscopy in May 2018 5 cm hiatal hernia was noted.   She was seen by Dr. Bethanie Dicker March 2020 for the anal fissure treated conservatively with sitz bath and nifedipine cream. She has had Botox injected in October 2019  Interval history 10/21/2020-11/04/2020  Since commencing on the Dexilant and Bentyl 10 mg 4 times daily she has had all symptoms of nausea, GERD completely resolved.  She feels great     Current Outpatient Medications  Medication Sig Dispense Refill  . ALPRAZolam (XANAX) 0.25 MG tablet Take 0.25 mg by mouth 2 (two) times daily as needed for anxiety.   2  . atorvastatin (LIPITOR) 40 MG tablet Take 80 mg by mouth daily.     Marland Kitchen azelastine (ASTELIN) 0.1 % nasal spray Place 1 spray into both nostrils 2 (two) times daily as needed for rhinitis.  4  . clopidogrel (PLAVIX) 75 MG tablet Take 75 mg by mouth daily.     Marland Kitchen dicyclomine (BENTYL) 10 MG capsule Take 1  capsule (10 mg total) by mouth 2 (two) times daily before a meal. 180 capsule 3  . gabapentin (NEURONTIN) 300 MG capsule Take 1 capsule (300 mg total) by mouth 3 (three) times daily. 90 capsule 0  . hydrALAZINE (APRESOLINE) 25 MG tablet Take 25 mg by mouth 3 (three) times daily.    . isosorbide mononitrate (IMDUR) 30 MG 24 hr tablet Take 30 mg by mouth daily with breakfast.    . letrozole (FEMARA) 2.5 MG tablet TAKE 1 TABLET BY MOUTH  DAILY 90 tablet 3  . levothyroxine (SYNTHROID, LEVOTHROID) 88 MCG tablet Take 88 mcg by mouth daily before breakfast.     . Magnesium 250 MG TABS Take by mouth.    . montelukast (SINGULAIR) 10 MG tablet Take 10 mg by mouth at bedtime.    . Olopatadine HCl 0.2 % SOLN Place 1 drop into both eyes daily as needed (for allergies).  2  . promethazine (PHENERGAN) 12.5 MG tablet Take 12.5 mg by mouth every 6 (six) hours as needed for nausea or vomiting.    . ranolazine (RANEXA) 1000 MG SR tablet Take 500 mg by mouth 2 (two) times daily.     . temazepam (RESTORIL) 30 MG capsule Take 30 mg by mouth at bedtime.    . traZODone (DESYREL) 50 MG tablet Take 50 mg by mouth at bedtime.     Marland Kitchen acetaminophen (TYLENOL) 650 MG CR tablet Take  1,300 mg by mouth every 8 (eight) hours as needed for pain.    Marland Kitchen alendronate (FOSAMAX) 70 MG tablet TAKE 1 TABLET BY MOUTH ONCE A WEEK. TAKE WITH A FULL GLASS OF WATER ON AN EMPTY STOMACH. 12 tablet 3  . [employer] ALLERGY RELIEF 4 MG tablet Take 4 mg by mouth every 12 (twelve) hours as needed for allergies.  3  . dexlansoprazole (DEXILANT) 60 MG capsule Take 1 capsule (60 mg total) by mouth daily. 90 capsule 3  . fexofenadine (ALLEGRA) 60 MG tablet Take 30 mg by mouth 2 (two) times daily as needed (allergies.).    Marland Kitchen hydrocortisone (ANUSOL-HC) 25 MG suppository Place 1 suppository (25 mg total) rectally as needed for hemorrhoids or anal itching. (Patient not taking: No sig reported) 12 suppository 1  . meclizine (ANTIVERT) 25 MG tablet Take 2 tablets (50 mg  total) by mouth 3 (three) times daily as needed for dizziness or nausea. 30 tablet 1  . nystatin (MYCOSTATIN/NYSTOP) powder Apply 1 g topically 3 (three) times daily as needed (for rash).  1  . ondansetron (ZOFRAN) 4 MG tablet  (Patient not taking: No sig reported)     No current facility-administered medications for this visit.   Facility-Administered Medications Ordered in Other Visits  Medication Dose Route Frequency Provider Last Rate Last Admin  . sodium chloride flush (NS) 0.9 % injection 3 mL  3 mL Intravenous Q12H Dionisio David, MD        Allergies as of 11/04/2020 - Review Complete 11/04/2020  Allergen Reaction Noted  . Other  01/09/2017    ROS:  General: Negative for anorexia, weight loss, fever, chills, fatigue, weakness. ENT: Negative for hoarseness, difficulty swallowing , nasal congestion. CV: Negative for chest pain, angina, palpitations, dyspnea on exertion, peripheral edema.  Respiratory: Negative for dyspnea at rest, dyspnea on exertion, cough, sputum, wheezing.  GI: See history of present illness. GU:  Negative for dysuria, hematuria, urinary incontinence, urinary frequency, nocturnal urination.  Endo: Negative for unusual weight change.    Physical Examination:   BP 130/76 (BP Location: Left Arm, Patient Position: Sitting, Cuff Size: Normal)   Pulse 82   Ht 5\' 6"  (1.676 m)   Wt 163 lb 6.4 oz (74.1 kg)   BMI 26.37 kg/m   General: Well-nourished, well-developed in no acute distress.  Eyes: No icterus. Conjunctivae pink. Extremities: No lower extremity edema. No clubbing or deformities. Neuro: Alert and oriented x 3.  Grossly intact. Skin: Warm and dry, no jaundice.   Psych: Alert and cooperative, normal mood and affect.   Imaging Studies: No results found.  Assessment and Plan:   JONIA OAKEY is a 70 y.o. y/o female  Here  to follow-up for GERD and IBS-like symptoms.   The GERD has completely resolved after commencing on Dexilant.  Symptoms  of early morning nausea have completely resolved after commencing on Bentyl and 10 mg 4 times daily.   Plan 1.  Provided refill of Dexilant which has helped her significantly and advised her to continue Bentyl.  Dr [name]  MD,MRCP [facility]) Follow up in as needed

## 2020-11-11 ENCOUNTER — Other Ambulatory Visit: Payer: Self-pay | Admitting: *Deleted

## 2020-11-11 MED ORDER — LETROZOLE 2.5 MG PO TABS
2.5000 mg | ORAL_TABLET | Freq: Every day | ORAL | 3 refills | Status: DC
Start: 2020-11-11 — End: 2021-09-16

## 2021-01-13 ENCOUNTER — Other Ambulatory Visit: Payer: Medicare HMO

## 2021-01-14 ENCOUNTER — Other Ambulatory Visit: Payer: Self-pay

## 2021-01-14 MED ORDER — DEXLANSOPRAZOLE 60 MG PO CPDR
60.0000 mg | DELAYED_RELEASE_CAPSULE | Freq: Every day | ORAL | 3 refills | Status: DC
Start: 2021-01-14 — End: 2022-04-18

## 2021-01-27 ENCOUNTER — Ambulatory Visit
Admission: RE | Admit: 2021-01-27 | Discharge: 2021-01-27 | Disposition: A | Discharge status: Home / Self Care | Payer: Medicare HMO | Source: Ambulatory Visit | Attending: Oncology | Admitting: Oncology

## 2021-01-27 ENCOUNTER — Other Ambulatory Visit: Payer: Self-pay

## 2021-01-27 DIAGNOSIS — Z923 Personal history of irradiation: Secondary | ICD-10-CM | POA: Diagnosis not present

## 2021-01-27 DIAGNOSIS — E039 Hypothyroidism, unspecified: Secondary | ICD-10-CM | POA: Diagnosis not present

## 2021-01-27 DIAGNOSIS — Z853 Personal history of malignant neoplasm of breast: Secondary | ICD-10-CM | POA: Diagnosis not present

## 2021-01-27 DIAGNOSIS — Z780 Asymptomatic menopausal state: Secondary | ICD-10-CM | POA: Insufficient documentation

## 2021-01-27 DIAGNOSIS — M810 Age-related osteoporosis without current pathological fracture: Secondary | ICD-10-CM | POA: Diagnosis not present

## 2021-01-27 DIAGNOSIS — C50311 Malignant neoplasm of lower-inner quadrant of right female breast: Secondary | ICD-10-CM

## 2021-01-27 DIAGNOSIS — Z1231 Encounter for screening mammogram for malignant neoplasm of breast: Secondary | ICD-10-CM | POA: Diagnosis not present

## 2021-01-27 DIAGNOSIS — Z9221 Personal history of antineoplastic chemotherapy: Secondary | ICD-10-CM | POA: Insufficient documentation

## 2021-01-27 DIAGNOSIS — Z170 Estrogen receptor positive status [ER+]: Secondary | ICD-10-CM

## 2021-01-27 DIAGNOSIS — Z13820 Encounter for screening for osteoporosis: Secondary | ICD-10-CM | POA: Diagnosis not present

## 2021-02-17 DIAGNOSIS — E782 Mixed hyperlipidemia: Secondary | ICD-10-CM | POA: Diagnosis not present

## 2021-02-17 DIAGNOSIS — E039 Hypothyroidism, unspecified: Secondary | ICD-10-CM | POA: Diagnosis not present

## 2021-02-17 DIAGNOSIS — I10 Essential (primary) hypertension: Secondary | ICD-10-CM | POA: Diagnosis not present

## 2021-02-17 DIAGNOSIS — R7303 Prediabetes: Secondary | ICD-10-CM | POA: Diagnosis not present

## 2021-02-17 DIAGNOSIS — E785 Hyperlipidemia, unspecified: Secondary | ICD-10-CM | POA: Diagnosis not present

## 2021-02-21 DIAGNOSIS — E039 Hypothyroidism, unspecified: Secondary | ICD-10-CM | POA: Diagnosis not present

## 2021-02-21 DIAGNOSIS — J069 Acute upper respiratory infection, unspecified: Secondary | ICD-10-CM | POA: Diagnosis not present

## 2021-02-21 DIAGNOSIS — G4700 Insomnia, unspecified: Secondary | ICD-10-CM | POA: Diagnosis not present

## 2021-02-21 DIAGNOSIS — J309 Allergic rhinitis, unspecified: Secondary | ICD-10-CM | POA: Diagnosis not present

## 2021-03-07 DIAGNOSIS — G8929 Other chronic pain: Secondary | ICD-10-CM | POA: Diagnosis not present

## 2021-03-07 DIAGNOSIS — Z7722 Contact with and (suspected) exposure to environmental tobacco smoke (acute) (chronic): Secondary | ICD-10-CM | POA: Diagnosis not present

## 2021-03-07 DIAGNOSIS — Z79811 Long term (current) use of aromatase inhibitors: Secondary | ICD-10-CM | POA: Diagnosis not present

## 2021-03-07 DIAGNOSIS — F411 Generalized anxiety disorder: Secondary | ICD-10-CM | POA: Diagnosis not present

## 2021-03-07 DIAGNOSIS — E663 Overweight: Secondary | ICD-10-CM | POA: Diagnosis not present

## 2021-03-07 DIAGNOSIS — I25119 Atherosclerotic heart disease of native coronary artery with unspecified angina pectoris: Secondary | ICD-10-CM | POA: Diagnosis not present

## 2021-03-07 DIAGNOSIS — E785 Hyperlipidemia, unspecified: Secondary | ICD-10-CM | POA: Diagnosis not present

## 2021-03-07 DIAGNOSIS — I739 Peripheral vascular disease, unspecified: Secondary | ICD-10-CM | POA: Diagnosis not present

## 2021-03-07 DIAGNOSIS — Z7983 Long term (current) use of bisphosphonates: Secondary | ICD-10-CM | POA: Diagnosis not present

## 2021-03-07 DIAGNOSIS — I10 Essential (primary) hypertension: Secondary | ICD-10-CM | POA: Diagnosis not present

## 2021-03-07 DIAGNOSIS — M810 Age-related osteoporosis without current pathological fracture: Secondary | ICD-10-CM | POA: Diagnosis not present

## 2021-03-07 DIAGNOSIS — E039 Hypothyroidism, unspecified: Secondary | ICD-10-CM | POA: Diagnosis not present

## 2021-03-07 DIAGNOSIS — Z7902 Long term (current) use of antithrombotics/antiplatelets: Secondary | ICD-10-CM | POA: Diagnosis not present

## 2021-03-07 DIAGNOSIS — Z7982 Long term (current) use of aspirin: Secondary | ICD-10-CM | POA: Diagnosis not present

## 2021-03-07 DIAGNOSIS — K589 Irritable bowel syndrome without diarrhea: Secondary | ICD-10-CM | POA: Diagnosis not present

## 2021-03-07 DIAGNOSIS — G4700 Insomnia, unspecified: Secondary | ICD-10-CM | POA: Diagnosis not present

## 2021-03-07 DIAGNOSIS — M1990 Unspecified osteoarthritis, unspecified site: Secondary | ICD-10-CM | POA: Diagnosis not present

## 2021-03-07 DIAGNOSIS — Z6828 Body mass index (BMI) 28.0-28.9, adult: Secondary | ICD-10-CM | POA: Diagnosis not present

## 2021-03-14 DIAGNOSIS — E871 Hypo-osmolality and hyponatremia: Secondary | ICD-10-CM | POA: Diagnosis not present

## 2021-03-14 DIAGNOSIS — I129 Hypertensive chronic kidney disease with stage 1 through stage 4 chronic kidney disease, or unspecified chronic kidney disease: Secondary | ICD-10-CM | POA: Diagnosis not present

## 2021-03-14 DIAGNOSIS — D631 Anemia in chronic kidney disease: Secondary | ICD-10-CM | POA: Diagnosis not present

## 2021-03-14 DIAGNOSIS — N1831 Chronic kidney disease, stage 3a: Secondary | ICD-10-CM | POA: Diagnosis not present

## 2021-03-14 DIAGNOSIS — N2581 Secondary hyperparathyroidism of renal origin: Secondary | ICD-10-CM | POA: Diagnosis not present

## 2021-03-15 DIAGNOSIS — J0100 Acute maxillary sinusitis, unspecified: Secondary | ICD-10-CM | POA: Diagnosis not present

## 2021-03-15 DIAGNOSIS — J0110 Acute frontal sinusitis, unspecified: Secondary | ICD-10-CM | POA: Diagnosis not present

## 2021-03-15 DIAGNOSIS — J209 Acute bronchitis, unspecified: Secondary | ICD-10-CM | POA: Diagnosis not present

## 2021-03-15 DIAGNOSIS — Z20822 Contact with and (suspected) exposure to covid-19: Secondary | ICD-10-CM | POA: Diagnosis not present

## 2021-03-24 DIAGNOSIS — J449 Chronic obstructive pulmonary disease, unspecified: Secondary | ICD-10-CM | POA: Diagnosis not present

## 2021-03-24 DIAGNOSIS — I10 Essential (primary) hypertension: Secondary | ICD-10-CM | POA: Diagnosis not present

## 2021-03-24 DIAGNOSIS — J309 Allergic rhinitis, unspecified: Secondary | ICD-10-CM | POA: Diagnosis not present

## 2021-03-24 DIAGNOSIS — G4700 Insomnia, unspecified: Secondary | ICD-10-CM | POA: Diagnosis not present

## 2021-03-24 DIAGNOSIS — R7303 Prediabetes: Secondary | ICD-10-CM | POA: Diagnosis not present

## 2021-03-24 DIAGNOSIS — E039 Hypothyroidism, unspecified: Secondary | ICD-10-CM | POA: Diagnosis not present

## 2021-03-24 DIAGNOSIS — E785 Hyperlipidemia, unspecified: Secondary | ICD-10-CM | POA: Diagnosis not present

## 2021-04-01 DIAGNOSIS — J449 Chronic obstructive pulmonary disease, unspecified: Secondary | ICD-10-CM | POA: Diagnosis not present

## 2021-04-05 DIAGNOSIS — J449 Chronic obstructive pulmonary disease, unspecified: Secondary | ICD-10-CM | POA: Diagnosis not present

## 2021-05-05 ENCOUNTER — Other Ambulatory Visit: Payer: Self-pay

## 2021-05-05 ENCOUNTER — Encounter: Payer: Self-pay | Admitting: Oncology

## 2021-05-05 ENCOUNTER — Inpatient Hospital Stay: Payer: Medicare HMO | Attending: Oncology | Admitting: Oncology

## 2021-05-05 VITALS — BP 113/70 | HR 77 | Temp 97.8°F | Resp 20 | Wt 177.9 lb

## 2021-05-05 DIAGNOSIS — Z170 Estrogen receptor positive status [ER+]: Secondary | ICD-10-CM | POA: Diagnosis not present

## 2021-05-05 DIAGNOSIS — M810 Age-related osteoporosis without current pathological fracture: Secondary | ICD-10-CM | POA: Insufficient documentation

## 2021-05-05 DIAGNOSIS — J449 Chronic obstructive pulmonary disease, unspecified: Secondary | ICD-10-CM | POA: Diagnosis not present

## 2021-05-05 DIAGNOSIS — N189 Chronic kidney disease, unspecified: Secondary | ICD-10-CM | POA: Diagnosis not present

## 2021-05-05 DIAGNOSIS — C50311 Malignant neoplasm of lower-inner quadrant of right female breast: Secondary | ICD-10-CM | POA: Diagnosis not present

## 2021-05-05 NOTE — Progress Notes (Signed)
[facility]  Telephone:([phone]) [phone] Fax:([phone]) [phone]  ID: Abigail Ortega OB: [birth year]  MR#: [id number]  JEH#:[id number]  Patient Care Team: Perrin Maltese, MD as PCP - General (Internal Medicine) Bary Castilla, Forest Gleason, MD (General Surgery) Sharene Butters, MD (General Surgery)  CHIEF COMPLAINT: Pathologic stage IIIa triple positive invasive carcinoma of the lower inner quadrant of the right breast.   INTERVAL HISTORY: Abigail Ortega is a 70 year old female with multiple medical problems including triple positive right breast cancer who is followed by Dr. Grayland Ormond.  She is currently on letrozole and tolerating this well.  Denies any new concerns.  Reports fatigue but she has been moving for the last week from a 3 bedroom home to a condo.  States she has been gaining weight due to eating fast food more frequently.  Reports occasional aches and pains but otherwise feels good.  She has been compliant with letrozole.  REVIEW OF SYSTEMS:   Review of Systems  Constitutional:  Positive for malaise/fatigue.   As per HPI. Otherwise, a complete review of systems is negative.  PAST MEDICAL HISTORY: Past Medical History:  Diagnosis Date   Anxiety    Arthritis    Breast cancer ([place]) 2014   Right breast cancer - chemo, radiation and Mastectomy   CHF (congestive heart failure) ([place] [facility]) 2013   Chronic kidney disease    Clotting disorder ([facility])    blood clots   Depression    GERD (gastroesophageal reflux disease)    GI bleed    H/O heart artery stent    Headache    Heart attack ([place]) 2012   coronary stent x2 completed by Dr. Clayborn Bigness.   Heart attack ([facility]) Apr 29, 2013   Hemorrhoids    Hypertension    Hypothyroidism    IBS (irritable bowel syndrome)    Malignant neoplasm of lower-inner quadrant of female breast ([facility]) January 20, 2013.   invasive mammary cancer, minimal 0.85 cm.  Histologic grade 1.   Personal history of chemotherapy 2014   BREAST CA   Personal history  of radiation therapy 2014   BREAST CA   Thyroid disease     PAST SURGICAL HISTORY: Past Surgical History:  Procedure Laterality Date   BOTOX INJECTION N/A 03/14/2017   Procedure: BOTOX INJECTION;  Surgeon: Jules Husbands, MD;  Location: [facility] ORS;  Service: General;  Laterality: N/A;   BOTOX INJECTION N/A 05/09/2018   Procedure: BOTOX INJECTION;  Surgeon: Jules Husbands, MD;  Location: [facility] ORS;  Service: General;  Laterality: N/A;   BREAST BIOPSY Right 2014   positive   BREAST SURGERY Right 02-21-2013   right mastectomy   CARDIAC CATHETERIZATION     COLONOSCOPY W/ BIOPSIES  09/15/2010   Colonoscopy completed by Verdie Shire, M.D. Tubular adenoma of the ascending colon and descending colon reported up to 0.4 cm in diameter. No atypia.   COLONOSCOPY WITH PROPOFOL N/A 12/23/2016   Procedure: COLONOSCOPY WITH PROPOFOL;  Surgeon: Wilford Corner, MD;  Location: [facility] ENDOSCOPY;  Service: Endoscopy;  Laterality: N/A;   ESOPHAGOGASTRODUODENOSCOPY N/A 12/13/2016   Procedure: ESOPHAGOGASTRODUODENOSCOPY (EGD);  Surgeon: [name], MD;  Location: [facility] ENDOSCOPY;  Service: Endoscopy;  Laterality: N/A;   LEFT HEART CATH N/A 04/02/2020   Procedure: Left Heart Cath with Coronary Angiography;  Surgeon: Dionisio David, MD;  Location: [facility] CV LAB;  Service: Cardiovascular;  Laterality: N/A;   LEFT HEART CATH AND CORONARY ANGIOGRAPHY Left 08/29/2018   Procedure: LEFT HEART CATH AND CORONARY ANGIOGRAPHY;  Surgeon: Dionisio David, MD;  Location: [facility] CV LAB;  Service: Cardiovascular;  Laterality: Left;   MASTECTOMY Right 2014   BREAST CA   PORTA CATH INSERTION     SPHINCTEROTOMY N/A 05/09/2018   Procedure: SPHINCTEROTOMY;  Surgeon: Jules Husbands, MD;  Location: [facility] ORS;  Service: General;  Laterality: N/A;   UPPER GI ENDOSCOPY  09/15/2010     Completed by Verdie Shire, M.D. for nausea. Normal exam reported.   VASCULAR SURGERY      FAMILY HISTORY Family History  Problem Relation Age of Onset    Breast cancer Mother 33   Cancer Father        colon       ADVANCED DIRECTIVES:    HEALTH MAINTENANCE: Social History   Tobacco Use   Smoking status: Former    Years: 20.00    Types: Cigarettes   Smokeless tobacco: Never  Vaping Use   Vaping Use: Never used  Substance Use Topics   Alcohol use: No   Drug use: No     Colonoscopy:  PAP:  Bone density:  Lipid panel:  Allergies  Allergen Reactions   Other     Note: burning, nausea with XR 75 mg    Current Outpatient Medications  Medication Sig Dispense Refill   acetaminophen (TYLENOL) 650 MG CR tablet Take 1,300 mg by mouth every 8 (eight) hours as needed for pain.     ALPRAZolam (XANAX) 0.25 MG tablet Take 0.25 mg by mouth 2 (two) times daily as needed for anxiety.   2   atorvastatin (LIPITOR) 40 MG tablet Take 80 mg by mouth daily.      azelastine (ASTELIN) 0.1 % nasal spray Place 1 spray into both nostrils 2 (two) times daily as needed for rhinitis.  4   clopidogrel (PLAVIX) 75 MG tablet Take 75 mg by mouth daily.      [employer] ALLERGY RELIEF 4 MG tablet Take 4 mg by mouth every 12 (twelve) hours as needed for allergies.  3   dexlansoprazole (DEXILANT) 60 MG capsule Take 1 capsule (60 mg total) by mouth daily. 90 capsule 3   dicyclomine (BENTYL) 10 MG capsule Take 1 capsule (10 mg total) by mouth 2 (two) times daily before a meal. 180 capsule 3   fexofenadine (ALLEGRA) 60 MG tablet Take 30 mg by mouth 2 (two) times daily as needed (allergies.).     gabapentin (NEURONTIN) 300 MG capsule Take 1 capsule (300 mg total) by mouth 3 (three) times daily. 90 capsule 0   hydrALAZINE (APRESOLINE) 25 MG tablet Take 25 mg by mouth 3 (three) times daily.     isosorbide mononitrate (IMDUR) 30 MG 24 hr tablet Take 30 mg by mouth daily with breakfast.     letrozole (FEMARA) 2.5 MG tablet Take 1 tablet (2.5 mg total) by mouth daily. 90 tablet 3   levothyroxine (SYNTHROID, LEVOTHROID) 88 MCG tablet Take 88 mcg by mouth daily before  breakfast.      Magnesium 250 MG TABS Take by mouth.     meclizine (ANTIVERT) 25 MG tablet Take 2 tablets (50 mg total) by mouth 3 (three) times daily as needed for dizziness or nausea. 30 tablet 1   montelukast (SINGULAIR) 10 MG tablet Take 10 mg by mouth at bedtime.     nystatin (MYCOSTATIN/NYSTOP) powder Apply 1 g topically 3 (three) times daily as needed (for rash).  1   Olopatadine HCl 0.2 % SOLN Place 1 drop into both eyes daily as  needed (for allergies).  2   promethazine (PHENERGAN) 12.5 MG tablet Take 12.5 mg by mouth every 6 (six) hours as needed for nausea or vomiting.     ranolazine (RANEXA) 1000 MG SR tablet Take 500 mg by mouth 2 (two) times daily.      temazepam (RESTORIL) 30 MG capsule Take 30 mg by mouth at bedtime.     traZODone (DESYREL) 50 MG tablet Take 50 mg by mouth at bedtime.      alendronate (FOSAMAX) 70 MG tablet TAKE 1 TABLET BY MOUTH ONCE A WEEK. TAKE WITH A FULL GLASS OF WATER ON AN EMPTY STOMACH. (Patient not taking: Reported on 05/05/2021) 12 tablet 3   hydrocortisone (ANUSOL-HC) 25 MG suppository Place 1 suppository (25 mg total) rectally as needed for hemorrhoids or anal itching. (Patient not taking: No sig reported) 12 suppository 1   ondansetron (ZOFRAN) 4 MG tablet  (Patient not taking: No sig reported)     No current facility-administered medications for this visit.   Facility-Administered Medications Ordered in Other Visits  Medication Dose Route Frequency Provider Last Rate Last Admin   sodium chloride flush (NS) 0.9 % injection 3 mL  3 mL Intravenous Q12H Neoma Laming A, MD        OBJECTIVE: Vitals:   05/05/21 1444  BP: 113/70  Pulse: 77  Resp: 20  Temp: 97.8 F (36.6 C)  SpO2: 100%     Body mass index is 28.71 kg/m.    ECOG FS:0 - Asymptomatic  Physical Exam Constitutional:      Appearance: Normal appearance.  HENT:     Head: Normocephalic and atraumatic.  Eyes:     Pupils: Pupils are equal, round, and reactive to light.   Cardiovascular:     Rate and Rhythm: Normal rate and regular rhythm.     Heart sounds: Normal heart sounds. No murmur heard. Pulmonary:     Effort: Pulmonary effort is normal.     Breath sounds: Normal breath sounds. No wheezing.  Abdominal:     General: Bowel sounds are normal. There is no distension.     Palpations: Abdomen is soft.     Tenderness: There is no abdominal tenderness.  Musculoskeletal:        General: Normal range of motion.     Cervical back: Normal range of motion.  Skin:    General: Skin is warm and dry.     Findings: No rash.  Neurological:     Mental Status: She is alert and oriented to person, place, and time.  Psychiatric:        Judgment: Judgment normal.    LAB RESULTS:  Lab Results  Component Value Date   NA 127 (L) 04/02/2020   K 4.0 04/02/2020   CL 91 (L) 04/02/2020   CO2 26 04/02/2020   GLUCOSE 102 (H) 04/02/2020   BUN 12 04/02/2020   CREATININE 1.38 (H) 04/02/2020   CALCIUM 9.6 04/02/2020   PROT 6.0 (L) 12/22/2016   ALBUMIN 3.3 (L) 12/22/2016   AST 18 12/22/2016   ALT 15 12/22/2016   ALKPHOS 76 12/22/2016   BILITOT 0.8 12/22/2016   GFRNONAA 39 (L) 04/02/2020   GFRAA 45 (L) 04/02/2020    Lab Results  Component Value Date   WBC 5.6 04/02/2020   NEUTROABS 5.1 11/02/2016   HGB 10.9 (L) 04/02/2020   HCT 31.3 (L) 04/02/2020   MCV 89.4 04/02/2020   PLT 320 04/02/2020     STUDIES: No results found.  ASSESSMENT: Pathologic  stage IIIa triple positive invasive carcinoma of the lower inner quadrant of the right breast.   PLAN:    1. Pathologic stage IIIa triple positive invasive carcinoma of the lower inner quadrant of the right breast:  She completed 14/18 Herceptin treatments back in in 2019 discontinued early secondary to persistent nausea.  She completed XRT in 2015.  She started letrozole in October 2015 and will continue for 7 years completing in October 2023.  Most recent mammogram is from 01/27/2021 which was read as BI-RADS  Category 1 negative.  2. Osteoporosis- Recent bone density scan from 01/27/2021 showed a T score of -2.9 which is slightly worse than previous.  Patient states she is unable to tolerate Fosamax.  She is currently taking calcium and vitamin D supplements.  Continue to monitor for now given we are stopping letrozole.  Patient in agreement.  Disposition-RTC in 6 months with follow-up with Dr. Grayland Ormond.  Stop letrozole.   I spent 25 minutes dedicated to the care of this patient (face-to-face and non-face-to-face) on the date of the encounter to include what is described in the assessment and plan.  Patient expressed understanding and was in agreement with this plan. She also understands that She can call clinic at any time with any questions, concerns, or complaints.   Breast cancer of lower-inner quadrant of right female breast   Staging form: Breast, AJCC 7th Edition     Pathologic stage from 02/24/2015: Stage IIIA (T1c, N2b, cM0) - Signed by Lloyd Huger, MD on 02/24/2015   Jacquelin Hawking, NP 05/05/21 3:43 PM

## 2021-05-13 DIAGNOSIS — R059 Cough, unspecified: Secondary | ICD-10-CM | POA: Diagnosis not present

## 2021-05-13 DIAGNOSIS — J069 Acute upper respiratory infection, unspecified: Secondary | ICD-10-CM | POA: Diagnosis not present

## 2021-05-13 DIAGNOSIS — Z20822 Contact with and (suspected) exposure to covid-19: Secondary | ICD-10-CM | POA: Diagnosis not present

## 2021-05-26 DIAGNOSIS — E039 Hypothyroidism, unspecified: Secondary | ICD-10-CM | POA: Diagnosis not present

## 2021-05-26 DIAGNOSIS — I10 Essential (primary) hypertension: Secondary | ICD-10-CM | POA: Diagnosis not present

## 2021-05-26 DIAGNOSIS — E785 Hyperlipidemia, unspecified: Secondary | ICD-10-CM | POA: Diagnosis not present

## 2021-05-26 DIAGNOSIS — R7303 Prediabetes: Secondary | ICD-10-CM | POA: Diagnosis not present

## 2021-05-31 DIAGNOSIS — F411 Generalized anxiety disorder: Secondary | ICD-10-CM | POA: Diagnosis not present

## 2021-05-31 DIAGNOSIS — N760 Acute vaginitis: Secondary | ICD-10-CM | POA: Diagnosis not present

## 2021-05-31 DIAGNOSIS — J069 Acute upper respiratory infection, unspecified: Secondary | ICD-10-CM | POA: Diagnosis not present

## 2021-05-31 DIAGNOSIS — E871 Hypo-osmolality and hyponatremia: Secondary | ICD-10-CM | POA: Diagnosis not present

## 2021-06-07 DIAGNOSIS — J309 Allergic rhinitis, unspecified: Secondary | ICD-10-CM | POA: Diagnosis not present

## 2021-06-07 DIAGNOSIS — N189 Chronic kidney disease, unspecified: Secondary | ICD-10-CM | POA: Diagnosis not present

## 2021-06-07 DIAGNOSIS — G4700 Insomnia, unspecified: Secondary | ICD-10-CM | POA: Diagnosis not present

## 2021-06-07 DIAGNOSIS — E871 Hypo-osmolality and hyponatremia: Secondary | ICD-10-CM | POA: Diagnosis not present

## 2021-06-07 DIAGNOSIS — F411 Generalized anxiety disorder: Secondary | ICD-10-CM | POA: Diagnosis not present

## 2021-06-23 DIAGNOSIS — N189 Chronic kidney disease, unspecified: Secondary | ICD-10-CM | POA: Diagnosis not present

## 2021-07-19 DIAGNOSIS — J449 Chronic obstructive pulmonary disease, unspecified: Secondary | ICD-10-CM | POA: Diagnosis not present

## 2021-07-28 DIAGNOSIS — D631 Anemia in chronic kidney disease: Secondary | ICD-10-CM | POA: Diagnosis not present

## 2021-07-28 DIAGNOSIS — I129 Hypertensive chronic kidney disease with stage 1 through stage 4 chronic kidney disease, or unspecified chronic kidney disease: Secondary | ICD-10-CM | POA: Diagnosis not present

## 2021-07-28 DIAGNOSIS — E871 Hypo-osmolality and hyponatremia: Secondary | ICD-10-CM | POA: Diagnosis not present

## 2021-07-28 DIAGNOSIS — N2581 Secondary hyperparathyroidism of renal origin: Secondary | ICD-10-CM | POA: Diagnosis not present

## 2021-07-28 DIAGNOSIS — N1831 Chronic kidney disease, stage 3a: Secondary | ICD-10-CM | POA: Diagnosis not present

## 2021-08-02 DIAGNOSIS — H6123 Impacted cerumen, bilateral: Secondary | ICD-10-CM | POA: Diagnosis not present

## 2021-08-02 DIAGNOSIS — N189 Chronic kidney disease, unspecified: Secondary | ICD-10-CM | POA: Diagnosis not present

## 2021-08-02 DIAGNOSIS — J069 Acute upper respiratory infection, unspecified: Secondary | ICD-10-CM | POA: Diagnosis not present

## 2021-08-02 DIAGNOSIS — E039 Hypothyroidism, unspecified: Secondary | ICD-10-CM | POA: Diagnosis not present

## 2021-08-09 DIAGNOSIS — N189 Chronic kidney disease, unspecified: Secondary | ICD-10-CM | POA: Diagnosis not present

## 2021-08-09 DIAGNOSIS — R7303 Prediabetes: Secondary | ICD-10-CM | POA: Diagnosis not present

## 2021-08-09 DIAGNOSIS — I10 Essential (primary) hypertension: Secondary | ICD-10-CM | POA: Diagnosis not present

## 2021-08-09 DIAGNOSIS — R300 Dysuria: Secondary | ICD-10-CM | POA: Diagnosis not present

## 2021-08-09 DIAGNOSIS — E039 Hypothyroidism, unspecified: Secondary | ICD-10-CM | POA: Diagnosis not present

## 2021-08-09 DIAGNOSIS — R109 Unspecified abdominal pain: Secondary | ICD-10-CM | POA: Diagnosis not present

## 2021-08-09 DIAGNOSIS — E782 Mixed hyperlipidemia: Secondary | ICD-10-CM | POA: Diagnosis not present

## 2021-08-09 DIAGNOSIS — E785 Hyperlipidemia, unspecified: Secondary | ICD-10-CM | POA: Diagnosis not present

## 2021-08-17 DIAGNOSIS — E871 Hypo-osmolality and hyponatremia: Secondary | ICD-10-CM | POA: Diagnosis not present

## 2021-08-17 DIAGNOSIS — N1831 Chronic kidney disease, stage 3a: Secondary | ICD-10-CM | POA: Diagnosis not present

## 2021-08-17 DIAGNOSIS — I129 Hypertensive chronic kidney disease with stage 1 through stage 4 chronic kidney disease, or unspecified chronic kidney disease: Secondary | ICD-10-CM | POA: Diagnosis not present

## 2021-08-29 DIAGNOSIS — D631 Anemia in chronic kidney disease: Secondary | ICD-10-CM | POA: Diagnosis not present

## 2021-08-29 DIAGNOSIS — N2581 Secondary hyperparathyroidism of renal origin: Secondary | ICD-10-CM | POA: Diagnosis not present

## 2021-08-29 DIAGNOSIS — I129 Hypertensive chronic kidney disease with stage 1 through stage 4 chronic kidney disease, or unspecified chronic kidney disease: Secondary | ICD-10-CM | POA: Diagnosis not present

## 2021-08-29 DIAGNOSIS — E871 Hypo-osmolality and hyponatremia: Secondary | ICD-10-CM | POA: Diagnosis not present

## 2021-08-29 DIAGNOSIS — N1832 Chronic kidney disease, stage 3b: Secondary | ICD-10-CM | POA: Diagnosis not present

## 2021-09-01 ENCOUNTER — Telehealth: Payer: Self-pay

## 2021-09-01 ENCOUNTER — Institutional Professional Consult (permissible substitution): Payer: Medicare HMO | Admitting: Pulmonary Disease

## 2021-09-01 NOTE — Telephone Encounter (Signed)
Left vm to confirm 09/05/21 appointment-Toni

## 2021-09-05 ENCOUNTER — Other Ambulatory Visit: Payer: Self-pay

## 2021-09-05 ENCOUNTER — Encounter: Payer: Self-pay | Admitting: Internal Medicine

## 2021-09-05 ENCOUNTER — Encounter (INDEPENDENT_AMBULATORY_CARE_PROVIDER_SITE_OTHER): Payer: Self-pay

## 2021-09-05 ENCOUNTER — Ambulatory Visit: Payer: Medicare HMO | Admitting: Internal Medicine

## 2021-09-05 ENCOUNTER — Telehealth: Payer: Self-pay

## 2021-09-05 VITALS — BP 120/60 | HR 75 | Temp 98.2°F | Resp 16 | Ht 66.0 in | Wt 184.6 lb

## 2021-09-05 DIAGNOSIS — R059 Cough, unspecified: Secondary | ICD-10-CM

## 2021-09-05 DIAGNOSIS — R0602 Shortness of breath: Secondary | ICD-10-CM

## 2021-09-05 DIAGNOSIS — J449 Chronic obstructive pulmonary disease, unspecified: Secondary | ICD-10-CM | POA: Diagnosis not present

## 2021-09-05 DIAGNOSIS — C50911 Malignant neoplasm of unspecified site of right female breast: Secondary | ICD-10-CM

## 2021-09-05 DIAGNOSIS — I2510 Atherosclerotic heart disease of native coronary artery without angina pectoris: Secondary | ICD-10-CM | POA: Diagnosis not present

## 2021-09-05 NOTE — Telephone Encounter (Signed)
Patient scheduled for ct chest on 09/08/21 @ 11 [facility]/tat

## 2021-09-05 NOTE — Patient Instructions (Signed)

## 2021-09-05 NOTE — Progress Notes (Signed)
[facility] [place], [place] [address]  Pulmonary Sleep Medicine   Office Visit Note  Patient Name: KHIRA CUDMORE DOB: Oct 30, 1950 MRN [id number]  Date of Service: 09/05/2021  Complaints/HPI: referred by nephrology for pulmonary evaluation. She has a cough which is wet. She states that she has been on abx which has not helped. States that she has been having SOB also. Sounds gurgly at night. She was given a nebulizer to help. She states that she has not noticed a great difference. She was given steroids and states that this has helped. States she is not taking any other inhalers at this time. States that she has not been smoking and is a former smoker. She states that she has had breast cancer in the past also has history of cardiac disease also. Her breast cancer resulted in a mastectomy. Patient has not had a follow up Chest CT since 2015 that is available to review  ROS  General: (-) fever, (-) chills, (-) night sweats, (-) weakness Skin: (-) rashes, (-) itching,. Eyes: (-) visual changes, (-) redness, (-) itching. Nose and Sinuses: (-) nasal stuffiness or itchiness, (-) postnasal drip, (-) nosebleeds, (-) sinus trouble. Mouth and Throat: (-) sore throat, (-) hoarseness. Neck: (-) swollen glands, (-) enlarged thyroid, (-) neck pain. Respiratory: + cough, (-) bloody sputum, + shortness of breath, + wheezing. Cardiovascular: + ankle swelling, (-) chest pain. Lymphatic: (-) lymph node enlargement. Neurologic: (-) numbness, (-) tingling. Psychiatric: (-) anxiety, (-) depression   Current Medication: Outpatient Encounter Medications as of 09/05/2021  Medication Sig Note   acetaminophen (TYLENOL) 650 MG CR tablet Take 1,300 mg by mouth every 8 (eight) hours as needed for pain. 11/03/2020: Prn    alendronate (FOSAMAX) 70 MG tablet TAKE 1 TABLET BY MOUTH ONCE A WEEK. TAKE WITH A FULL GLASS OF WATER ON AN EMPTY STOMACH. 05/05/2021: Stop taking because made  stomach upset   ALPRAZolam (XANAX) 0.25 MG tablet Take 0.25 mg by mouth 2 (two) times daily as needed for anxiety.     azelastine (ASTELIN) 0.1 % nasal spray Place 1 spray into both nostrils 2 (two) times daily as needed for rhinitis.    clopidogrel (PLAVIX) 75 MG tablet Take 75 mg by mouth daily.     dexlansoprazole (DEXILANT) 60 MG capsule Take 1 capsule (60 mg total) by mouth daily.    fexofenadine (ALLEGRA) 60 MG tablet Take 30 mg by mouth 2 (two) times daily as needed (allergies.). 11/03/2020: prn   gabapentin (NEURONTIN) 300 MG capsule Take 1 capsule (300 mg total) by mouth 3 (three) times daily.    isosorbide mononitrate (IMDUR) 30 MG 24 hr tablet Take 30 mg by mouth daily with breakfast.    letrozole (FEMARA) 2.5 MG tablet Take 1 tablet (2.5 mg total) by mouth daily.    levothyroxine (SYNTHROID, LEVOTHROID) 88 MCG tablet Take 88 mcg by mouth daily before breakfast.     meclizine (ANTIVERT) 25 MG tablet Take 2 tablets (50 mg total) by mouth 3 (three) times daily as needed for dizziness or nausea. 11/03/2020: prn   montelukast (SINGULAIR) 10 MG tablet Take 10 mg by mouth at bedtime.    ondansetron (ZOFRAN) 4 MG tablet     pantoprazole (PROTONIX) 40 MG tablet Take by mouth.    promethazine (PHENERGAN) 12.5 MG tablet Take 12.5 mg by mouth every 6 (six) hours as needed for nausea or vomiting.    temazepam (RESTORIL) 30 MG capsule Take 30 mg by mouth at  bedtime.    traZODone (DESYREL) 50 MG tablet Take 50 mg by mouth at bedtime.     dicyclomine (BENTYL) 10 MG capsule Take 1 capsule (10 mg total) by mouth 2 (two) times daily before a meal.    hydrALAZINE (APRESOLINE) 25 MG tablet Take 25 mg by mouth 3 (three) times daily. (Patient not taking: Reported on 09/05/2021)    Magnesium 250 MG TABS Take by mouth. (Patient not taking: Reported on 09/05/2021)    ranolazine (RANEXA) 1000 MG SR tablet Take 500 mg by mouth 2 (two) times daily.  (Patient not taking: Reported on 09/05/2021)    [DISCONTINUED]  atorvastatin (LIPITOR) 40 MG tablet Take 80 mg by mouth daily.  (Patient not taking: Reported on 09/05/2021)    [DISCONTINUED] [employer] ALLERGY RELIEF 4 MG tablet Take 4 mg by mouth every 12 (twelve) hours as needed for allergies. (Patient not taking: Reported on 09/05/2021) 11/03/2020: prn   [DISCONTINUED] hydrocortisone (ANUSOL-HC) 25 MG suppository Place 1 suppository (25 mg total) rectally as needed for hemorrhoids or anal itching. (Patient not taking: Reported on 08/30/2020)    [DISCONTINUED] nystatin (MYCOSTATIN/NYSTOP) powder Apply 1 g topically 3 (three) times daily as needed (for rash). (Patient not taking: Reported on 09/05/2021) 11/03/2020: prn   [DISCONTINUED] Olopatadine HCl 0.2 % SOLN Place 1 drop into both eyes daily as needed (for allergies). (Patient not taking: Reported on 09/05/2021)    Facility-Administered Encounter Medications as of 09/05/2021  Medication   sodium chloride flush (NS) 0.9 % injection 3 mL    Surgical History: Past Surgical History:  Procedure Laterality Date   BOTOX INJECTION N/A 03/14/2017   Procedure: BOTOX INJECTION;  Surgeon: Jules Husbands, MD;  Location: [facility] ORS;  Service: General;  Laterality: N/A;   BOTOX INJECTION N/A 05/09/2018   Procedure: BOTOX INJECTION;  Surgeon: Jules Husbands, MD;  Location: [facility] ORS;  Service: General;  Laterality: N/A;   BREAST BIOPSY Right 2014   positive   BREAST SURGERY Right 02-21-2013   right mastectomy   CARDIAC CATHETERIZATION     COLONOSCOPY W/ BIOPSIES  09/15/2010   Colonoscopy completed by Verdie Shire, M.D. Tubular adenoma of the ascending colon and descending colon reported up to 0.4 cm in diameter. No atypia.   COLONOSCOPY WITH PROPOFOL N/A 12/23/2016   Procedure: COLONOSCOPY WITH PROPOFOL;  Surgeon: Wilford Corner, MD;  Location: [facility] ENDOSCOPY;  Service: Endoscopy;  Laterality: N/A;   ESOPHAGOGASTRODUODENOSCOPY N/A 12/13/2016   Procedure: ESOPHAGOGASTRODUODENOSCOPY (EGD);  Surgeon: [name], MD;  Location: [facility]  ENDOSCOPY;  Service: Endoscopy;  Laterality: N/A;   LEFT HEART CATH N/A 04/02/2020   Procedure: Left Heart Cath with Coronary Angiography;  Surgeon: Dionisio David, MD;  Location: [place] CV LAB;  Service: Cardiovascular;  Laterality: N/A;   LEFT HEART CATH AND CORONARY ANGIOGRAPHY Left 08/29/2018   Procedure: LEFT HEART CATH AND CORONARY ANGIOGRAPHY;  Surgeon: Dionisio David, MD;  Location: [place] CV LAB;  Service: Cardiovascular;  Laterality: Left;   MASTECTOMY Right 2014   BREAST CA   PORTA CATH INSERTION     SPHINCTEROTOMY N/A 05/09/2018   Procedure: SPHINCTEROTOMY;  Surgeon: Jules Husbands, MD;  Location: [facility] ORS;  Service: General;  Laterality: N/A;   UPPER GI ENDOSCOPY  09/15/2010     Completed by Verdie Shire, M.D. for nausea. Normal exam reported.   VASCULAR SURGERY      Medical History: Past Medical History:  Diagnosis Date   Anxiety    Arthritis    Breast cancer (  [place]) 2014   Right breast cancer - chemo, radiation and Mastectomy   CHF (congestive heart failure) ([place]) 2013   Chronic kidney disease    Clotting disorder (HCC)    blood clots   Depression    GERD (gastroesophageal reflux disease)    GI bleed    H/O heart artery stent    Headache    Heart attack ([place]) 2012   coronary stent x2 completed by Dr. Clayborn Bigness.   Heart attack ([place]) Apr 29, 2013   Hemorrhoids    Hypertension    Hypothyroidism    IBS (irritable bowel syndrome)    Malignant neoplasm of lower-inner quadrant of female breast ([place]) January 20, 2013.   invasive mammary cancer, minimal 0.85 cm.  Histologic grade 1.   Personal history of chemotherapy 2014   BREAST CA   Personal history of radiation therapy 2014   BREAST CA   Thyroid disease     Family History: Family History  Problem Relation Age of Onset   Breast cancer Mother 81   Cancer Father        colon    Social History: Social History   Socioeconomic History   Marital status: Divorced    Spouse name: Not on file   Number of  children: Not on file   Years of education: Not on file   Highest education level: Not on file  Occupational History   Not on file  Tobacco Use   Smoking status: Former    Years: 20.00    Types: Cigarettes   Smokeless tobacco: Never  Vaping Use   Vaping Use: Never used  Substance and Sexual Activity   Alcohol use: No   Drug use: No   Sexual activity: Never  Other Topics Concern   Not on file  Social History Narrative   Not on file   Social Determinants of Health   Financial Resource Strain: Not on file  Food Insecurity: Not on file  Transportation Needs: Not on file  Physical Activity: Not on file  Stress: Not on file  Social Connections: Not on file  Intimate Partner Violence: Not on file    Vital Signs: Blood pressure 120/60, pulse 75, temperature 98.2 F (36.8 C), resp. rate 16, height 5\' 6"  (1.676 m), weight 184 lb 9.6 oz (83.7 kg), SpO2 97 %.  Examination: General Appearance: The patient is well-developed, well-nourished, and in no distress. Skin: Gross inspection of skin unremarkable. Head: normocephalic, no gross deformities. Eyes: no gross deformities noted. ENT: ears appear grossly normal no exudates. Neck: Supple. No thyromegaly. No LAD. Respiratory: few rhonchi noted at this timed. Cardiovascular: Normal S1 and S2 without murmur or rub. Extremities: No cyanosis. pulses are equal. Neurologic: Alert and oriented. No involuntary movements.  LABS: No results found for this or any previous visit (from the past 2160 hour(s)).  Radiology: DG Bone Density  Result Date: 01/28/2021 EXAM: DUAL X-RAY ABSORPTIOMETRY (DXA) FOR BONE MINERAL DENSITY IMPRESSION: Your patient Phylis Javed completed a BMD test on 01/27/2021 using the [place] (software version: 14.10) manufactured by [employer]. The following summarizes the results of our evaluation. Technologist: [facility] PATIENT BIOGRAPHICAL: Name: Eileen, Kangas Patient ID: [id number]  Birth Date: [birth year] Height: 66.0 in. Gender: Female Exam Date: 01/27/2021 Weight: 169.8 lbs. Indications: Advanced Age, Breast CA, Height Loss, High Risk Meds, History of Breast Cancer, History of Chemo, History of Radiation, Hypothyroid, Postmenopausal, Previous Chemo and Radiation, Vitamin D Deficiency Fractures: Treatments: ASPRIN 81 MG, Calcium,  Fosamax, Gabapentin, letrozole, Levothyroxine, Multi-Vitamin with calcium, Prilosec, Protonix, Vitamin D DENSITOMETRY RESULTS: Site         Region     Measured Date Measured Age WHO Classification Young Adult T-score BMD         %Change vs. Previous Significant Change (*) DualFemur Total Left 01/27/2021 70.8 Osteoporosis -2.7 0.664 g/cm2 -1.6% - DualFemur Total Left 01/13/2020 69.8 Osteoporosis -2.6 0.675 g/cm2 -13.9% Yes DualFemur Total Left 12/27/2018 68.8 Osteopenia -1.8 0.784 g/cm2 7.5% Yes DualFemur Total Left 10/15/2017 67.6 Osteopenia -2.2 0.729 g/cm2 0.6% - DualFemur Total Left 09/19/2016 66.5 Osteopenia -2.2 0.725 g/cm2 -0.5% - DualFemur Total Left 07/05/2015 65.3 Osteopenia -2.2 0.729 g/cm2 7.4% Yes DualFemur Total Left 06/30/2014 64.3 Osteoporosis -2.6 0.679 g/cm2 - - DualFemur Total Mean 01/27/2021 70.8 Osteoporosis -2.6 0.681 g/cm2 -1.3% - DualFemur Total Mean 01/13/2020 69.8 Osteoporosis -2.5 0.690 g/cm2 -7.1% Yes DualFemur Total Mean 12/27/2018 68.8 Osteopenia -2.1 0.743 g/cm2 2.1% - DualFemur Total Mean 10/15/2017 67.6 Osteopenia -2.2 0.728 g/cm2 -3.2% Yes DualFemur Total Mean 09/19/2016 66.5 Osteopenia -2.0 0.752 g/cm2 1.9% - DualFemur Total Mean 07/05/2015 65.3 Osteopenia -2.1 0.738 g/cm2 4.4% Yes DualFemur Total Mean 06/30/2014 64.3 Osteopenia -2.4 0.707 g/cm2 - - Left Forearm Radius 33% 01/27/2021 70.8 Osteoporosis -2.9 0.621 g/cm2 -3.6% - Left Forearm Radius 33% 01/13/2020 69.8 Osteoporosis -2.7 0.644 g/cm2 1.1% - Left Forearm Radius 33% 12/27/2018 68.8 Osteoporosis -2.7 0.637 g/cm2 -1.8% - Left Forearm Radius 33% 10/15/2017 67.6 Osteoporosis -2.6  0.649 g/cm2 -3.1% - Left Forearm Radius 33% 09/19/2016 66.5 Osteopenia -2.4 0.670 g/cm2 -9.7% Yes Left Forearm Radius 33% 06/30/2014 64.3 Osteopenia -1.5 0.742 g/cm2 - - ASSESSMENT: The BMD measured at Forearm Radius 33% is 0.621 g/cm2 with a T-score of -2.9. This patient is considered osteoporotic according to [facility]) criteria. The scan quality is good. Lumbar spine was not utilized due to advanced degenerative changes. Compared with prior study, there has been no significant change in the total hip. World Pharmacologist [facility]) criteria for post-menopausal, Caucasian Women: Normal:                   T-score at or above -1 SD Osteopenia/low bone mass: T-score between -1 and -2.5 SD Osteoporosis:             T-score at or below -2.5 SD RECOMMENDATIONS: 1. All patients should optimize calcium and vitamin D intake. 2. Consider FDA-approved medical therapies in postmenopausal women and men aged 74 years and older, based on the following: a. A hip or vertebral(clinical or morphometric) fracture b. T-score < -2.5 at the femoral neck or spine after appropriate evaluation to exclude secondary causes c. Low bone mass (T-score between -1.0 and -2.5 at the femoral neck or spine) and a 10-year probability of a hip fracture > 3% or a 10-year probability of a major osteoporosis-related fracture > 20% based on the [place]-adapted WHO algorithm 3. Clinician judgment and/or patient preferences may indicate treatment for people with 10-year fracture probabilities above or below these levels FOLLOW-UP: People with diagnosed cases of osteoporosis or at high risk for fracture should have regular bone mineral density tests. For patients eligible for Medicare, routine testing is allowed once every 2 years. The testing frequency can be increased to one year for patients who have rapidly progressing disease, those who are receiving or discontinuing medical therapy to restore bone mass, or have additional risk factors.  I have reviewed this report, and agree with the above findings. [facility] Electronically Signed   By:  Rolm Baptise M.D.   On: 01/28/2021 11:24   MM 3D SCREEN BREAST UNI LEFT  Result Date: 01/29/2021 CLINICAL DATA:  Screening. EXAM: DIGITAL SCREENING UNILATERAL LEFT MAMMOGRAM WITH CAD AND TOMOSYNTHESIS TECHNIQUE: Left screening digital craniocaudal and mediolateral oblique mammograms were obtained. Left screening digital breast tomosynthesis was performed. The images were evaluated with computer-aided detection. COMPARISON:  Previous exam(s). ACR Breast Density Category b: There are scattered areas of fibroglandular density. FINDINGS: The patient has had a right mastectomy. There are no findings suspicious for malignancy. IMPRESSION: No mammographic evidence of malignancy. A result letter of this screening mammogram will be mailed directly to the patient. RECOMMENDATION: Screening mammogram in one year.  (Code:SM-L-78M) BI-RADS CATEGORY  1: Negative. Electronically Signed   By: Ammie Ferrier M.D.   On: 01/29/2021 15:34   No results found.  No results found.    Assessment and Plan: Patient Active Problem List   Diagnosis Date Noted   Coronary syndrome, acute ([place]) 08/23/2018   Anal fissure    Personal history of colonic polyps 12/23/2016   Hyponatremia 12/21/2016   GIB (gastrointestinal bleeding) 12/12/2016   Depressive disorder 01/14/2016   Headache 01/14/2016   Joint pain 01/14/2016   Myocardial infarction ([place]) 01/14/2016   Obesity (BMI 35.0-39.9 without comorbidity) 08/20/2015   Anal pain 01/13/2015   Anxiety 12/16/2013   Breast cancer ([place]) 12/16/2013   CAD (coronary artery disease) 12/16/2013   Hyperlipidemia, unspecified 12/16/2013   Hypertension 12/16/2013   Hypothyroid 12/16/2013   Nausea 12/16/2013   Acquired absence of breast 03/04/2013   Congestive heart failure ([place]) 03/04/2013   Breast cancer of lower-inner quadrant of right female breast ([place])  01/28/2013    1. Obstructive chronic bronchitis without exacerbation [facility]) Medical management and monitoring of progression of disease we will get pulmonary functions to be done. - Pulmonary function test; Future  2. Cough in adult Of concern recommended that we do a CT for imaging studies to make certain there is no nodules that need to be addressed - CT Chest Wo Contrast; Future - Pulmonary function test; Future  3. Shortness of breath  - Pulmonary function test; Future - ECHOCARDIOGRAM COMPLETE; Future  4. Malignant neoplasm of right female breast, unspecified estrogen receptor status, unspecified site of breast ([place]) With her history of malignant neoplasm also would be concerned about any pulmonary involvement - CT Chest Wo Contrast; Future  5. Coronary artery disease involving native coronary artery of native heart without angina pectoris This has been stable we will monitor  General Counseling: I have discussed the findings of the evaluation and examination with Mardene Celeste.  I have also discussed any further diagnostic evaluation thatmay be needed or ordered today. Joslynn verbalizes understanding of the findings of todays visit. We also reviewed her medications today and discussed drug interactions and side effects including but not limited excessive drowsiness and altered mental states. We also discussed that there is always a risk not just to her but also people around her. she has been encouraged to call the office with any questions or concerns that should arise related to todays visit.  Orders Placed This Encounter  Procedures   CT Chest Wo Contrast    Standing Status:   Future    Number of Occurrences:   1    Standing Expiration Date:   09/05/2022    Order Specific Question:   Preferred imaging location?    Answer:   [facility]   ECHOCARDIOGRAM COMPLETE    Standing Status:  Future    Standing Expiration Date:   09/05/2022    Order Specific Question:   Where  should this test be performed    Answer:   External    Order Specific Question:   Perflutren DEFINITY (image enhancing agent) should be administered unless hypersensitivity or allergy exist    Answer:   Administer Perflutren    Order Specific Question:   Is a special reader required? (athlete or structural heart)    Answer:   No    Order Specific Question:   Does this study need to be read by the Structural team/Level 3 readers?    Answer:   No    Order Specific Question:   Reason for exam-Echo    Answer:   Dyspnea  R06.00   Pulmonary function test    Standing Status:   Future    Standing Expiration Date:   09/05/2022    Order Specific Question:   Where should this test be performed?    Answer:   [facility]     Time spent: 52  I have personally obtained a history, examined the patient, evaluated laboratory and imaging results, formulated the assessment and plan and placed orders.    Allyne Gee, MD [facility] Pulmonary and Critical Care Sleep medicine

## 2021-09-08 ENCOUNTER — Other Ambulatory Visit: Payer: Self-pay

## 2021-09-08 ENCOUNTER — Ambulatory Visit
Admission: RE | Admit: 2021-09-08 | Discharge: 2021-09-08 | Disposition: A | Discharge status: Home / Self Care | Payer: Medicare HMO | Source: Ambulatory Visit | Attending: Internal Medicine | Admitting: Internal Medicine

## 2021-09-08 DIAGNOSIS — R059 Cough, unspecified: Secondary | ICD-10-CM | POA: Diagnosis not present

## 2021-09-08 DIAGNOSIS — R053 Chronic cough: Secondary | ICD-10-CM | POA: Diagnosis not present

## 2021-09-08 DIAGNOSIS — R911 Solitary pulmonary nodule: Secondary | ICD-10-CM | POA: Diagnosis not present

## 2021-09-08 DIAGNOSIS — C50911 Malignant neoplasm of unspecified site of right female breast: Secondary | ICD-10-CM | POA: Diagnosis not present

## 2021-09-16 ENCOUNTER — Other Ambulatory Visit: Payer: Self-pay | Admitting: Oncology

## 2021-09-21 ENCOUNTER — Ambulatory Visit: Payer: Medicare HMO | Admitting: Internal Medicine

## 2021-09-21 ENCOUNTER — Other Ambulatory Visit: Payer: Self-pay

## 2021-09-21 ENCOUNTER — Telehealth: Payer: Self-pay

## 2021-09-21 DIAGNOSIS — E039 Hypothyroidism, unspecified: Secondary | ICD-10-CM | POA: Diagnosis not present

## 2021-09-21 DIAGNOSIS — Z03818 Encounter for observation for suspected exposure to other biological agents ruled out: Secondary | ICD-10-CM | POA: Diagnosis not present

## 2021-09-21 DIAGNOSIS — Z20822 Contact with and (suspected) exposure to covid-19: Secondary | ICD-10-CM | POA: Diagnosis not present

## 2021-09-21 DIAGNOSIS — I10 Essential (primary) hypertension: Secondary | ICD-10-CM | POA: Diagnosis not present

## 2021-09-21 DIAGNOSIS — N189 Chronic kidney disease, unspecified: Secondary | ICD-10-CM | POA: Diagnosis not present

## 2021-09-21 DIAGNOSIS — R7303 Prediabetes: Secondary | ICD-10-CM | POA: Diagnosis not present

## 2021-09-21 DIAGNOSIS — E785 Hyperlipidemia, unspecified: Secondary | ICD-10-CM | POA: Diagnosis not present

## 2021-09-21 NOTE — Telephone Encounter (Signed)
Pt came in office for appt to do PFT.  Pt informed Loma Sousa that she had a bad URI and per Fritz Pickerel he advised that pt cancel appt and reschedule when she feels better.  Pt was asking to get some medication for the URI and we advised pt that she needs to go have a covid test and then call us back to give [place] results then we can make her a virtual appt.  Pt is having a cough and congested and is bringing up yellow mucus.  Informed pt to call us after 830 am on 09/22/21 and we can make her a virtual visit and the provider can prescribe abx if needed ?

## 2021-09-22 ENCOUNTER — Encounter: Payer: Self-pay | Admitting: Physician Assistant

## 2021-09-22 ENCOUNTER — Telehealth: Payer: Medicare HMO | Admitting: Physician Assistant

## 2021-09-22 DIAGNOSIS — J441 Chronic obstructive pulmonary disease with (acute) exacerbation: Secondary | ICD-10-CM

## 2021-09-22 MED ORDER — AZITHROMYCIN 250 MG PO TABS: ORAL_TABLET | ORAL | 0 refills | Status: DC

## 2021-09-22 MED ORDER — PREDNISONE 10 MG PO TABS: ORAL_TABLET | ORAL | 0 refills | Status: DC

## 2021-09-22 NOTE — Progress Notes (Cosign Needed)
[facility] [place], [place] [address]  Internal MEDICINE  Telephone Visit  Patient Name: Abigail Ortega  [id number]  [id number]  Date of Service: 09/22/2021  I connected with the patient at 10:03 by telephone and verified the patients identity using two identifiers.   I discussed the limitations, risks, security and privacy concerns of performing an evaluation and management service by telephone and the availability of in person appointments. I also discussed with the patient that there may be a patient responsible charge related to the service.  The patient expressed understanding and agrees to proceed.    Chief Complaint  Patient presents with   Telephone Screen    [phone]   Telephone Assessment   Cough    Causing some chest tightness, is trying some Mucinex, tested negative for covid yesterday    HPI Pt is here for a virtual sick visit -She has been coughing with green mucus production with some chest tightness, has been getting better then gets worse again. Did better after prednisone given by nephrology. Some SOB with exertion and wheezing. -She has tried mucinex -She was negative for covid -She does have COPD and uses nebulizer as needed, about 3x per day since not feeling well  Current Medication: Outpatient Encounter Medications as of 09/22/2021  Medication Sig Note   acetaminophen (TYLENOL) 650 MG CR tablet Take 1,300 mg by mouth every 8 (eight) hours as needed for pain. 11/03/2020: Prn    albuterol (VENTOLIN HFA) 108 (90 Base) MCG/ACT inhaler Inhale into the lungs.    alendronate (FOSAMAX) 70 MG tablet TAKE 1 TABLET BY MOUTH ONCE A WEEK. TAKE WITH A FULL GLASS OF WATER ON AN EMPTY STOMACH. 05/05/2021: Stop taking because made stomach upset   ALPRAZolam (XANAX) 0.25 MG tablet Take 0.25 mg by mouth 2 (two) times daily as needed for anxiety.     azelastine (ASTELIN) 0.1 % nasal spray Place 1 spray into both nostrils 2 (two) times daily as  needed for rhinitis.    azithromycin (ZITHROMAX) 250 MG tablet Take one tab a day for 10 days for uri    clopidogrel (PLAVIX) 75 MG tablet Take 75 mg by mouth daily.     dexlansoprazole (DEXILANT) 60 MG capsule Take 1 capsule (60 mg total) by mouth daily.    fexofenadine (ALLEGRA) 60 MG tablet Take 30 mg by mouth 2 (two) times daily as needed (allergies.). 11/03/2020: prn   gabapentin (NEURONTIN) 300 MG capsule Take 1 capsule (300 mg total) by mouth 3 (three) times daily.    hydrALAZINE (APRESOLINE) 25 MG tablet Take 25 mg by mouth 3 (three) times daily.    isosorbide mononitrate (IMDUR) 30 MG 24 hr tablet Take 30 mg by mouth daily with breakfast.    letrozole (FEMARA) 2.5 MG tablet TAKE 1 TABLET EVERY DAY    levothyroxine (SYNTHROID, LEVOTHROID) 88 MCG tablet Take 88 mcg by mouth daily before breakfast.     Magnesium 250 MG TABS Take by mouth.    meclizine (ANTIVERT) 25 MG tablet Take 2 tablets (50 mg total) by mouth 3 (three) times daily as needed for dizziness or nausea. 11/03/2020: prn   montelukast (SINGULAIR) 10 MG tablet Take 10 mg by mouth at bedtime.    ondansetron (ZOFRAN) 4 MG tablet     pantoprazole (PROTONIX) 40 MG tablet Take by mouth.    predniSONE (DELTASONE) 10 MG tablet Take one tab 3 x day for 3 days, then take one tab 2 x a day for  3 days and then take one tab a day for 3 days for copd    promethazine (PHENERGAN) 12.5 MG tablet Take 12.5 mg by mouth every 6 (six) hours as needed for nausea or vomiting.    ranolazine (RANEXA) 1000 MG SR tablet Take 500 mg by mouth 2 (two) times daily.    temazepam (RESTORIL) 30 MG capsule Take 30 mg by mouth at bedtime.    traZODone (DESYREL) 50 MG tablet Take 50 mg by mouth at bedtime.     dicyclomine (BENTYL) 10 MG capsule Take 1 capsule (10 mg total) by mouth 2 (two) times daily before a meal.    Facility-Administered Encounter Medications as of 09/22/2021  Medication   sodium chloride flush (NS) 0.9 % injection 3 mL    Surgical  History: Past Surgical History:  Procedure Laterality Date   BOTOX INJECTION N/A 03/14/2017   Procedure: BOTOX INJECTION;  Surgeon: Jules Husbands, MD;  Location: [facility] ORS;  Service: General;  Laterality: N/A;   BOTOX INJECTION N/A 05/09/2018   Procedure: BOTOX INJECTION;  Surgeon: Jules Husbands, MD;  Location: [facility] ORS;  Service: General;  Laterality: N/A;   BREAST BIOPSY Right 2014   positive   BREAST SURGERY Right 02-21-2013   right mastectomy   CARDIAC CATHETERIZATION     COLONOSCOPY W/ BIOPSIES  09/15/2010   Colonoscopy completed by Verdie Shire, M.D. Tubular adenoma of the ascending colon and descending colon reported up to 0.4 cm in diameter. No atypia.   COLONOSCOPY WITH PROPOFOL N/A 12/23/2016   Procedure: COLONOSCOPY WITH PROPOFOL;  Surgeon: Wilford Corner, MD;  Location: [facility] ENDOSCOPY;  Service: Endoscopy;  Laterality: N/A;   ESOPHAGOGASTRODUODENOSCOPY N/A 12/13/2016   Procedure: ESOPHAGOGASTRODUODENOSCOPY (EGD);  Surgeon: [name], MD;  Location[facility] ENDOSCOPY;  Service: Endoscopy;  Laterality: N/A;   LEFT HEART CATH N/A 04/02/2020   Procedure: Left Heart Cath with Coronary Angiography;  Surgeon: Dionisio David, MD;  Location: [place] CV LAB;  Service: Cardiovascular;  Laterality: N/A;   LEFT HEART CATH AND CORONARY ANGIOGRAPHY Left 08/29/2018   Procedure: LEFT HEART CATH AND CORONARY ANGIOGRAPHY;  Surgeon: Dionisio David, MD;  Location: [place] CV LAB;  Service: Cardiovascular;  Laterality: Left;   MASTECTOMY Right 2014   BREAST CA   PORTA CATH INSERTION     SPHINCTEROTOMY N/A 05/09/2018   Procedure: SPHINCTEROTOMY;  Surgeon: Jules Husbands, MD;  Location: [facility] ORS;  Service: General;  Laterality: N/A;   UPPER GI ENDOSCOPY  09/15/2010     Completed by Verdie Shire, M.D. for nausea. Normal exam reported.   VASCULAR SURGERY      Medical History: Past Medical History:  Diagnosis Date   Anxiety    Arthritis    Breast cancer ([place]) 2014   Right breast cancer - chemo,  radiation and Mastectomy   CHF (congestive heart failure) ([place]) 2013   Chronic kidney disease    Clotting disorder (HCC)    blood clots   Depression    GERD (gastroesophageal reflux disease)    GI bleed    H/O heart artery stent    Headache    Heart attack ([place]) 2012   coronary stent x2 completed by Dr. Clayborn Bigness.   Heart attack ([place]) Apr 29, 2013   Hemorrhoids    Hypertension    Hypothyroidism    IBS (irritable bowel syndrome)    Malignant neoplasm of lower-inner quadrant of female breast ([place]) January 20, 2013.   invasive mammary cancer, minimal 0.85 cm.  Histologic grade 1.   Personal history of chemotherapy 2014   BREAST CA   Personal history of radiation therapy 2014   BREAST CA   Thyroid disease     Family History: Family History  Problem Relation Age of Onset   Breast cancer Mother 78   Cancer Father        colon    Social History   Socioeconomic History   Marital status: Divorced    Spouse name: Not on file   Number of children: Not on file   Years of education: Not on file   Highest education level: Not on file  Occupational History   Not on file  Tobacco Use   Smoking status: Former    Years: 20.00    Types: Cigarettes   Smokeless tobacco: Never  Vaping Use   Vaping Use: Never used  Substance and Sexual Activity   Alcohol use: No   Drug use: No   Sexual activity: Never  Other Topics Concern   Not on file  Social History Narrative   Not on file   Social Determinants of Health   Financial Resource Strain: Not on file  Food Insecurity: Not on file  Transportation Needs: Not on file  Physical Activity: Not on file  Stress: Not on file  Social Connections: Not on file  Intimate Partner Violence: Not on file      Review of Systems  Constitutional:  Positive for fatigue. Negative for fever.  HENT:  Positive for congestion and postnasal drip. Negative for mouth sores.   Respiratory:  Positive for cough, shortness of breath and wheezing.    Cardiovascular:  Negative for chest pain.  Genitourinary:  Negative for flank pain.  Psychiatric/Behavioral: Negative.     Vital Signs: There were no vitals taken for this visit.   Observation/Objective:  Pt is able to carry out conversation   Assessment/Plan: 1. Chronic obstructive pulmonary disease with acute exacerbation (HCC) Will start on zpak and prednisone. Continue mucinex and nasal spray. Also continue nebulizer as needed. Advised to stay well hydrated and rest. - azithromycin (ZITHROMAX) 250 MG tablet; Take one tab a day for 10 days for uri  Dispense: 10 tablet; Refill: 0 - predniSONE (DELTASONE) 10 MG tablet; Take one tab 3 x day for 3 days, then take one tab 2 x a day for 3 days and then take one tab a day for 3 days for copd  Dispense: 18 tablet; Refill: 0   General Counseling: america sandall understanding of the findings of today's phone visit and agrees with plan of treatment. I have discussed any further diagnostic evaluation that may be needed or ordered today. We also reviewed her medications today. she has been encouraged to call the office with any questions or concerns that should arise related to todays visit.    No orders of the defined types were placed in this encounter.   Meds ordered this encounter  Medications   azithromycin (ZITHROMAX) 250 MG tablet    Sig: Take one tab a day for 10 days for uri    Dispense:  10 tablet    Refill:  0   predniSONE (DELTASONE) 10 MG tablet    Sig: Take one tab 3 x day for 3 days, then take one tab 2 x a day for 3 days and then take one tab a day for 3 days for copd    Dispense:  18 tablet    Refill:  0    Time spent:25  Minutes    Dr Lavera Guise Internal medicine

## 2021-09-29 DIAGNOSIS — E782 Mixed hyperlipidemia: Secondary | ICD-10-CM | POA: Diagnosis not present

## 2021-09-29 DIAGNOSIS — J309 Allergic rhinitis, unspecified: Secondary | ICD-10-CM | POA: Diagnosis not present

## 2021-09-29 DIAGNOSIS — Z13820 Encounter for screening for osteoporosis: Secondary | ICD-10-CM | POA: Diagnosis not present

## 2021-09-29 DIAGNOSIS — E039 Hypothyroidism, unspecified: Secondary | ICD-10-CM | POA: Diagnosis not present

## 2021-09-29 DIAGNOSIS — R109 Unspecified abdominal pain: Secondary | ICD-10-CM | POA: Diagnosis not present

## 2021-09-29 DIAGNOSIS — F411 Generalized anxiety disorder: Secondary | ICD-10-CM | POA: Diagnosis not present

## 2021-10-03 ENCOUNTER — Ambulatory Visit: Payer: Medicare HMO | Admitting: Internal Medicine

## 2021-10-03 ENCOUNTER — Other Ambulatory Visit: Payer: Self-pay | Admitting: Nurse Practitioner

## 2021-10-03 DIAGNOSIS — Z1231 Encounter for screening mammogram for malignant neoplasm of breast: Secondary | ICD-10-CM

## 2021-10-05 ENCOUNTER — Ambulatory Visit: Payer: Medicare HMO | Admitting: Internal Medicine

## 2021-10-05 ENCOUNTER — Other Ambulatory Visit: Payer: Self-pay

## 2021-10-05 DIAGNOSIS — R0602 Shortness of breath: Secondary | ICD-10-CM

## 2021-10-05 DIAGNOSIS — R059 Cough, unspecified: Secondary | ICD-10-CM

## 2021-10-05 DIAGNOSIS — J449 Chronic obstructive pulmonary disease, unspecified: Secondary | ICD-10-CM

## 2021-10-18 ENCOUNTER — Other Ambulatory Visit: Payer: Self-pay | Admitting: Gastroenterology

## 2021-10-18 ENCOUNTER — Ambulatory Visit: Payer: Medicare HMO | Admitting: Internal Medicine

## 2021-10-18 NOTE — Procedures (Signed)
[facility] ?[place] ?[place], [address] ? ? ? ?Complete Pulmonary Function Testing Interpretation: ? ?FINDINGS: ? ?Forced vital capacity is moderately decreased.  FEV1 is 1.04 L which is 54% of predicted and is moderately decreased.  F1 FVC ratio was normal.  Postbronchodilator there was significant improvement in the FEV1 total lung capacity is moderately decreased.  Residual volume is normal residual volume total lung capacity ratio is increased FRC is decreased.  DLCO is moderately decreased ? ?IMPRESSION: ? ?This pulmonary function study is suggestive moderate restrictive lung disease clinical correlation is recommended ? ?Allyne Gee, MD FCCP ?Pulmonary Critical Care Medicine ?Sleep Medicine ? ?

## 2021-10-19 ENCOUNTER — Ambulatory Visit: Payer: Medicare HMO

## 2021-10-19 ENCOUNTER — Encounter: Payer: Self-pay | Admitting: Internal Medicine

## 2021-10-19 DIAGNOSIS — R0602 Shortness of breath: Secondary | ICD-10-CM | POA: Diagnosis not present

## 2021-10-19 LAB — PULMONARY FUNCTION TEST

## 2021-10-20 ENCOUNTER — Ambulatory Visit: Payer: Medicare HMO | Admitting: Internal Medicine

## 2021-10-20 ENCOUNTER — Encounter: Payer: Self-pay | Admitting: Internal Medicine

## 2021-10-20 ENCOUNTER — Telehealth: Payer: Self-pay

## 2021-10-20 VITALS — BP 116/64 | HR 76 | Temp 98.1°F | Resp 16 | Ht 66.0 in | Wt 186.4 lb

## 2021-10-20 DIAGNOSIS — R0602 Shortness of breath: Secondary | ICD-10-CM

## 2021-10-20 DIAGNOSIS — R911 Solitary pulmonary nodule: Secondary | ICD-10-CM

## 2021-10-20 DIAGNOSIS — J441 Chronic obstructive pulmonary disease with (acute) exacerbation: Secondary | ICD-10-CM

## 2021-10-20 MED ORDER — BREZTRI AEROSPHERE 160-9-4.8 MCG/ACT IN AERO: 2.0000 | INHALATION_SPRAY | Freq: Two times a day (BID) | RESPIRATORY_TRACT | 11 refills | Status: DC

## 2021-10-20 NOTE — Telephone Encounter (Signed)
Routine chest CT ordered. Printed. Gave to Titania-Toni ?

## 2021-10-20 NOTE — Progress Notes (Signed)
[facility] ?[address] ?[place], [place] [address] ? ?Pulmonary Sleep Medicine  ? ?Office Visit Note ? ?Patient Name: Abigail Ortega ?DOB: [birth year] ?MRN [id number] ? ?Date of Service: 10/20/2021 ? ?Complaints/HPI: PFT done which shows MODERATE COPD. She is not smoking and currently is not on inhalers on a regular bases. Patient did also have a CT chest done shows presence of 64m nodule and needs to be followed up ? ?ROS ? ?General: (-) fever, (-) chills, (-) night sweats, (-) weakness ?Skin: (-) rashes, (-) itching,. ?Eyes: (-) visual changes, (-) redness, (-) itching. ?Nose and Sinuses: (-) nasal stuffiness or itchiness, (-) postnasal drip, (-) nosebleeds, (-) sinus trouble. ?Mouth and Throat: (-) sore throat, (-) hoarseness. ?Neck: (-) swollen glands, (-) enlarged thyroid, (-) neck pain. ?Respiratory: + cough, (-) bloody sputum, + shortness of breath, - wheezing. ?Cardiovascular: - ankle swelling, (-) chest pain. ?Lymphatic: (-) lymph node enlargement. ?Neurologic: (-) numbness, (-) tingling. ?Psychiatric: (-) anxiety, (-) depression ? ? ?Current Medication: ?Outpatient Encounter Medications as of 10/20/2021  ?Medication Sig Note  ? acetaminophen (TYLENOL) 650 MG CR tablet Take 1,300 mg by mouth every 8 (eight) hours as needed for pain. 11/03/2020: Prn   ? albuterol (VENTOLIN HFA) 108 (90 Base) MCG/ACT inhaler Inhale into the lungs.   ? ALPRAZolam (XANAX) 0.25 MG tablet Take 0.25 mg by mouth 2 (two) times daily as needed for anxiety.    ? azelastine (ASTELIN) 0.1 % nasal spray Place 1 spray into both nostrils 2 (two) times daily as needed for rhinitis.   ? azithromycin (ZITHROMAX) 250 MG tablet Take one tab a day for 10 days for uri   ? clopidogrel (PLAVIX) 75 MG tablet Take 75 mg by mouth daily.    ? dexlansoprazole (DEXILANT) 60 MG capsule Take 1 capsule (60 mg total) by mouth daily.   ? dicyclomine (BENTYL) 10 MG capsule TAKE 1 CAPSULE (10 MG TOTAL) BY MOUTH 2 (TWO) TIMES DAILY BEFORE A MEAL.    ? fexofenadine (ALLEGRA) 60 MG tablet Take 30 mg by mouth 2 (two) times daily as needed (allergies.). 11/03/2020: prn  ? gabapentin (NEURONTIN) 300 MG capsule Take 1 capsule (300 mg total) by mouth 3 (three) times daily.   ? hydrALAZINE (APRESOLINE) 25 MG tablet Take 25 mg by mouth 3 (three) times daily.   ? isosorbide mononitrate (IMDUR) 30 MG 24 hr tablet Take 30 mg by mouth daily with breakfast.   ? letrozole (FEMARA) 2.5 MG tablet TAKE 1 TABLET EVERY DAY   ? levothyroxine (SYNTHROID, LEVOTHROID) 88 MCG tablet Take 88 mcg by mouth daily before breakfast.    ? Magnesium 250 MG TABS Take by mouth.   ? meclizine (ANTIVERT) 25 MG tablet Take 2 tablets (50 mg total) by mouth 3 (three) times daily as needed for dizziness or nausea. 11/03/2020: prn  ? montelukast (SINGULAIR) 10 MG tablet Take 10 mg by mouth at bedtime.   ? ondansetron (ZOFRAN) 4 MG tablet    ? pantoprazole (PROTONIX) 40 MG tablet Take by mouth.   ? predniSONE (DELTASONE) 10 MG tablet Take one tab 3 x day for 3 days, then take one tab 2 x a day for 3 days and then take one tab a day for 3 days for copd   ? promethazine (PHENERGAN) 12.5 MG tablet Take 12.5 mg by mouth every 6 (six) hours as needed for nausea or vomiting.   ? ranolazine (RANEXA) 1000 MG SR tablet Take 500 mg by mouth 2 (two) times  daily.   ? temazepam (RESTORIL) 30 MG capsule Take 30 mg by mouth at bedtime.   ? traZODone (DESYREL) 50 MG tablet Take 50 mg by mouth at bedtime.    ? [DISCONTINUED] alendronate (FOSAMAX) 70 MG tablet TAKE 1 TABLET BY MOUTH ONCE A WEEK. TAKE WITH A FULL GLASS OF WATER ON AN EMPTY STOMACH. 05/05/2021: Stop taking because made stomach upset  ? ?Facility-Administered Encounter Medications as of 10/20/2021  ?Medication  ? sodium chloride flush (NS) 0.9 % injection 3 mL  ? ? ?Surgical History: ?Past Surgical History:  ?Procedure Laterality Date  ? BOTOX INJECTION N/A 03/14/2017  ? Procedure: BOTOX INJECTION;  Surgeon: Jules Husbands, MD;  Location: [facility] ORS;  Service:  General;  Laterality: N/A;  ? BOTOX INJECTION N/A 05/09/2018  ? Procedure: BOTOX INJECTION;  Surgeon: Jules Husbands, MD;  Location: [facility] ORS;  Service: General;  Laterality: N/A;  ? BREAST BIOPSY Right 2014  ? positive  ? BREAST SURGERY Right 02-21-2013  ? right mastectomy  ? CARDIAC CATHETERIZATION    ? COLONOSCOPY W/ BIOPSIES  09/15/2010  ? Colonoscopy completed by Verdie Shire, M.D. Tubular adenoma of the ascending colon and descending colon reported up to 0.4 cm in diameter. No atypia.  ? COLONOSCOPY WITH PROPOFOL N/A 12/23/2016  ? Procedure: COLONOSCOPY WITH PROPOFOL;  Surgeon: Wilford Corner, MD;  Location: [facility] ENDOSCOPY;  Service: Endoscopy;  Laterality: N/A;  ? ESOPHAGOGASTRODUODENOSCOPY N/A 12/13/2016  ? Procedure: ESOPHAGOGASTRODUODENOSCOPY (EGD);  Surgeon: [name], MD;  Location: [facility] ENDOSCOPY;  Service: Endoscopy;  Laterality: N/A;  ? LEFT HEART CATH N/A 04/02/2020  ? Procedure: Left Heart Cath with Coronary Angiography;  Surgeon: Dionisio David, MD;  Location: [place] CV LAB;  Service: Cardiovascular;  Laterality: N/A;  ? LEFT HEART CATH AND CORONARY ANGIOGRAPHY Left 08/29/2018  ? Procedure: LEFT HEART CATH AND CORONARY ANGIOGRAPHY;  Surgeon: Dionisio David, MD;  Location: [place] CV LAB;  Service: Cardiovascular;  Laterality: Left;  ? MASTECTOMY Right 2014  ? BREAST CA  ? PORTA CATH INSERTION    ? SPHINCTEROTOMY N/A 05/09/2018  ? Procedure: SPHINCTEROTOMY;  Surgeon: Jules Husbands, MD;  Location: [facility] ORS;  Service: General;  Laterality: N/A;  ? UPPER GI ENDOSCOPY  09/15/2010    ? Completed by Verdie Shire, M.D. for nausea. Normal exam reported.  ? VASCULAR SURGERY    ? ? ?Medical History: ?Past Medical History:  ?Diagnosis Date  ? Anxiety   ? Arthritis   ? Breast cancer ([place]) 2014  ? Right breast cancer - chemo, radiation and Mastectomy  ? CHF (congestive heart failure) ([place]) 2013  ? Chronic kidney disease   ? Clotting disorder ([place])   ? blood clots  ? Depression   ? GERD (gastroesophageal  reflux disease)   ? GI bleed   ? H/O heart artery stent   ? Headache   ? Heart attack [facility]) 2012  ? coronary stent x2 completed by Dr. Clayborn Bigness.  ? Heart attack ([place]) Apr 29, 2013  ? Hemorrhoids   ? Hypertension   ? Hypothyroidism   ? IBS (irritable bowel syndrome)   ? Malignant neoplasm of lower-inner quadrant of female breast [facility]) January 20, 2013.  ? invasive mammary cancer, minimal 0.85 cm.  Histologic grade 1.  ? Personal history of chemotherapy 2014  ? BREAST CA  ? Personal history of radiation therapy 2014  ? BREAST CA  ? Thyroid disease   ? ? ?Family History: ?Family History  ?Problem Relation Age of  Onset  ? Breast cancer Mother 68  ? Cancer Father   ?     colon  ? ? ?Social History: ?Social History  ? ?Socioeconomic History  ? Marital status: Divorced  ?  Spouse name: Not on file  ? Number of children: Not on file  ? Years of education: Not on file  ? Highest education level: Not on file  ?Occupational History  ? Not on file  ?Tobacco Use  ? Smoking status: Former  ?  Years: 20.00  ?  Types: Cigarettes  ? Smokeless tobacco: Never  ?Vaping Use  ? Vaping Use: Never used  ?Substance and Sexual Activity  ? Alcohol use: No  ? Drug use: No  ? Sexual activity: Never  ?Other Topics Concern  ? Not on file  ?Social History Narrative  ? Not on file  ? ?Social Determinants of Health  ? ?Financial Resource Strain: Not on file  ?Food Insecurity: Not on file  ?Transportation Needs: Not on file  ?Physical Activity: Not on file  ?Stress: Not on file  ?Social Connections: Not on file  ?Intimate Partner Violence: Not on file  ? ? ?Vital Signs: ?Blood pressure 116/64, pulse 76, temperature 98.1 ?F (36.7 ?C), resp. rate 16, height '5\' 6"'$  (1.676 m), weight 186 lb 6.4 oz (84.6 kg), SpO2 97 %. ? ?Examination: ?General Appearance: The patient is well-developed, well-nourished, and in no distress. ?Skin: Gross inspection of skin unremarkable. ?Head: normocephalic, no gross deformities. ?Eyes: no gross deformities noted. ?ENT: ears  appear grossly normal no exudates. ?Neck: Supple. No thyromegaly. No LAD. ?Respiratory: few rhonchi noted. ?Cardiovascular: Normal S1 and S2 without murmur or rub. ?Extremities: No cyanosis. pulses are equal.

## 2021-10-20 NOTE — Progress Notes (Signed)
[facility] ?[address] ?[place], [place] [address] ? ?Pulmonary Sleep Medicine  ? ?Office Visit Note ? ?Patient Name: Abigail Ortega ?DOB: 01-Jan-1951 ?MRN [id number] ? ?Date of Service: 10/20/2021 ? ?Complaints/HPI: Patient here for follow-up on COPD pulmonary nodule.  She states she is having some shortness of breath some cough noted.  She had a CT scan done which showed a 7 mm nodule in the middle lobe.  Likely a granuloma but does warrant follow-up.  I did discuss with her the findings of the CT in detail.  She is agreeable to getting a follow-up on CT of the chest done. ? ?ROS ? ?General: (-) fever, (-) chills, (-) night sweats, (-) weakness ?Skin: (-) rashes, (-) itching,. ?Eyes: (-) visual changes, (-) redness, (-) itching. ?Nose and Sinuses: (-) nasal stuffiness or itchiness, (-) postnasal drip, (-) nosebleeds, (-) sinus trouble. ?Mouth and Throat: (-) sore throat, (-) hoarseness. ?Neck: (-) swollen glands, (-) enlarged thyroid, (-) neck pain. ?Respiratory: - cough, (-) bloody sputum, + shortness of breath, - wheezing. ?Cardiovascular: - ankle swelling, (-) chest pain. ?Lymphatic: (-) lymph node enlargement. ?Neurologic: (-) numbness, (-) tingling. ?Psychiatric: (-) anxiety, (-) depression ? ? ?Current Medication: ?Outpatient Encounter Medications as of 10/20/2021  ?Medication Sig Note  ? acetaminophen (TYLENOL) 650 MG CR tablet Take 1,300 mg by mouth every 8 (eight) hours as needed for pain. 11/03/2020: Prn   ? albuterol (VENTOLIN HFA) 108 (90 Base) MCG/ACT inhaler Inhale into the lungs.   ? ALPRAZolam (XANAX) 0.25 MG tablet Take 0.25 mg by mouth 2 (two) times daily as needed for anxiety.    ? azelastine (ASTELIN) 0.1 % nasal spray Place 1 spray into both nostrils 2 (two) times daily as needed for rhinitis.   ? azithromycin (ZITHROMAX) 250 MG tablet Take one tab a day for 10 days for uri   ? clopidogrel (PLAVIX) 75 MG tablet Take 75 mg by mouth daily.    ? dexlansoprazole (DEXILANT) 60 MG  capsule Take 1 capsule (60 mg total) by mouth daily.   ? dicyclomine (BENTYL) 10 MG capsule TAKE 1 CAPSULE (10 MG TOTAL) BY MOUTH 2 (TWO) TIMES DAILY BEFORE A MEAL.   ? fexofenadine (ALLEGRA) 60 MG tablet Take 30 mg by mouth 2 (two) times daily as needed (allergies.). 11/03/2020: prn  ? gabapentin (NEURONTIN) 300 MG capsule Take 1 capsule (300 mg total) by mouth 3 (three) times daily.   ? hydrALAZINE (APRESOLINE) 25 MG tablet Take 25 mg by mouth 3 (three) times daily.   ? isosorbide mononitrate (IMDUR) 30 MG 24 hr tablet Take 30 mg by mouth daily with breakfast.   ? letrozole (FEMARA) 2.5 MG tablet TAKE 1 TABLET EVERY DAY   ? levothyroxine (SYNTHROID, LEVOTHROID) 88 MCG tablet Take 88 mcg by mouth daily before breakfast.    ? Magnesium 250 MG TABS Take by mouth.   ? meclizine (ANTIVERT) 25 MG tablet Take 2 tablets (50 mg total) by mouth 3 (three) times daily as needed for dizziness or nausea. 11/03/2020: prn  ? montelukast (SINGULAIR) 10 MG tablet Take 10 mg by mouth at bedtime.   ? ondansetron (ZOFRAN) 4 MG tablet    ? pantoprazole (PROTONIX) 40 MG tablet Take by mouth.   ? predniSONE (DELTASONE) 10 MG tablet Take one tab 3 x day for 3 days, then take one tab 2 x a day for 3 days and then take one tab a day for 3 days for copd   ? promethazine (PHENERGAN) 12.5  MG tablet Take 12.5 mg by mouth every 6 (six) hours as needed for nausea or vomiting.   ? ranolazine (RANEXA) 1000 MG SR tablet Take 500 mg by mouth 2 (two) times daily.   ? temazepam (RESTORIL) 30 MG capsule Take 30 mg by mouth at bedtime.   ? traZODone (DESYREL) 50 MG tablet Take 50 mg by mouth at bedtime.    ? [DISCONTINUED] alendronate (FOSAMAX) 70 MG tablet TAKE 1 TABLET BY MOUTH ONCE A WEEK. TAKE WITH A FULL GLASS OF WATER ON AN EMPTY STOMACH. 05/05/2021: Stop taking because made stomach upset  ? ?Facility-Administered Encounter Medications as of 10/20/2021  ?Medication  ? sodium chloride flush (NS) 0.9 % injection 3 mL  ? ? ?Surgical History: ?Past  Surgical History:  ?Procedure Laterality Date  ? BOTOX INJECTION N/A 03/14/2017  ? Procedure: BOTOX INJECTION;  Surgeon: Jules Husbands, MD;  Location: [facility] ORS;  Service: General;  Laterality: N/A;  ? BOTOX INJECTION N/A 05/09/2018  ? Procedure: BOTOX INJECTION;  Surgeon: Jules Husbands, MD;  Location: [facility] ORS;  Service: General;  Laterality: N/A;  ? BREAST BIOPSY Right 2014  ? positive  ? BREAST SURGERY Right 02-21-2013  ? right mastectomy  ? CARDIAC CATHETERIZATION    ? COLONOSCOPY W/ BIOPSIES  09/15/2010  ? Colonoscopy completed by Verdie Shire, M.D. Tubular adenoma of the ascending colon and descending colon reported up to 0.4 cm in diameter. No atypia.  ? COLONOSCOPY WITH PROPOFOL N/A 12/23/2016  ? Procedure: COLONOSCOPY WITH PROPOFOL;  Surgeon: Wilford Corner, MD;  Location[facility] END[facility]COPY;  Service: Endoscopy;  Laterality: N/A;  ? ESOPHAGOGASTRODUODENOSCOPY N/A 12/13/2016  ? Procedure: ESOPHAGOGASTRODUODENOSCOPY (EGD);  Surgeon: [name], MD;  Location: [facility] ENDOSCOPY;  Service: Endoscopy;  Laterality: N/A;  ? LEFT HEART CATH N/A 04/02/2020  ? Procedure: Left Heart Cath with Coronary Angiography;  Surgeon: Dionisio David, MD;  Location: [facility] CV LAB;  Service: Cardiovascular;  Laterality: N/A;  ? LEFT HEART CATH AND CORONARY ANGIOGRAPHY Left 08/29/2018  ? Procedure: LEFT HEART CATH AND CORONARY ANGIOGRAPHY;  Surgeon: Dionisio David, MD;  Location: [facility] CV LAB;  Service: Cardiovascular;  Laterality: Left;  ? MASTECTOMY Right 2014  ? BREAST CA  ? PORTA CATH INSERTION    ? SPHINCTEROTOMY N/A 05/09/2018  ? Procedure: SPHINCTEROTOMY;  Surgeon: Jules Husbands, MD;  Location: [facility] ORS;  Service: General;  Laterality: N/A;  ? UPPER GI ENDOSCOPY  09/15/2010    ? Completed by Verdie Shire, M.D. for nausea. Normal exam reported.  ? VASCULAR SURGERY    ? ? ?Medical History: ?Past Medical History:  ?Diagnosis Date  ? Anxiety   ? Arthritis   ? Breast cancer ([place]) 2014  ? Right breast cancer - chemo, radiation and  Mastectomy  ? CHF (congestive heart failure) ([place]) 2013  ? Chronic kidney disease   ? Clotting disorder ([place])   ? blood clots  ? Depression   ? GERD (gastroesophageal reflux disease)   ? GI bleed   ? H/O heart artery stent   ? Headache   ? Heart attack [facility]) 2012  ? coronary stent x2 completed by Dr. Clayborn Bigness.  ? Heart attack ([place]) Apr 29, 2013  ? Hemorrhoids   ? Hypertension   ? Hypothyroidism   ? IBS (irritable bowel syndrome)   ? Malignant neoplasm of lower-inner quadrant of female breast [facility]) January 20, 2013.  ? invasive mammary cancer, minimal 0.85 cm.  Histologic grade 1.  ? Personal history of chemotherapy  2014  ? BREAST CA  ? Personal history of radiation therapy 2014  ? BREAST CA  ? Thyroid disease   ? ? ?Family History: ?Family History  ?Problem Relation Age of Onset  ? Breast cancer Mother 36  ? Cancer Father   ?     colon  ? ? ?Social History: ?Social History  ? ?Socioeconomic History  ? Marital status: Divorced  ?  Spouse name: Not on file  ? Number of children: Not on file  ? Years of education: Not on file  ? Highest education level: Not on file  ?Occupational History  ? Not on file  ?Tobacco Use  ? Smoking status: Former  ?  Years: 20.00  ?  Types: Cigarettes  ? Smokeless tobacco: Never  ?Vaping Use  ? Vaping Use: Never used  ?Substance and Sexual Activity  ? Alcohol use: No  ? Drug use: No  ? Sexual activity: Never  ?Other Topics Concern  ? Not on file  ?Social History Narrative  ? Not on file  ? ?Social Determinants of Health  ? ?Financial Resource Strain: Not on file  ?Food Insecurity: Not on file  ?Transportation Needs: Not on file  ?Physical Activity: Not on file  ?Stress: Not on file  ?Social Connections: Not on file  ?Intimate Partner Violence: Not on file  ? ? ?Vital Signs: ?Blood pressure 116/64, pulse 76, temperature 98.1 ?F (36.7 ?C), resp. rate 16, height '5\' 6"'$  (1.676 m), weight 186 lb 6.4 oz (84.6 kg), SpO2 97 %. ? ?Examination: ?General Appearance: The patient is well-developed,  well-nourished, and in no distress. ?Skin: Gross inspection of skin unremarkable. ?Head: normocephalic, no gross deformities. ?Eyes: no gross deformities noted. ?ENT: ears appear grossly normal no exudates. ?Neck: Suppl

## 2021-10-20 NOTE — Patient Instructions (Signed)

## 2021-10-25 ENCOUNTER — Other Ambulatory Visit: Payer: Self-pay | Admitting: Oncology

## 2021-10-25 DIAGNOSIS — M810 Age-related osteoporosis without current pathological fracture: Secondary | ICD-10-CM | POA: Insufficient documentation

## 2021-10-31 NOTE — Progress Notes (Signed)
?[facility]  ?[phone] [phone] ? ?ID: Durwin Reges OB: May 20, 1951  MR#: [id number]  MOQ#:[id number] ? ?Patient Care Team: ?Perrin Maltese, MD as PCP - General (Internal Medicine) ?Robert Bellow, MD (General Surgery) ?Sharene Butters, MD (General Surgery) ? ?CHIEF COMPLAINT: Pathologic stage IIIa triple positive invasive carcinoma of the lower inner quadrant of the right breast.  ? ?INTERVAL HISTORY: Patient returns to clinic today for routine 41-monthevaluation and consideration of Zometa for her osteoporosis.  She has now discontinued letrozole.  She currently feels well.  She has no neurologic complaints. She denies any recent fevers or illnesses.  She denies any chest pain, shortness of breath, cough, or hemoptysis.  She denies any vomiting, constipation, or diarrhea. She has no urinary complaints.  Patient offers no further specific complaints today. ? ?REVIEW OF SYSTEMS:   ?Review of Systems  ?Constitutional: Negative.  Negative for chills, fever, malaise/fatigue and weight loss.  ?HENT: Negative.  Negative for tinnitus.   ?Eyes: Negative.   ?Respiratory: Negative.  Negative for cough and sputum production.   ?Cardiovascular: Negative.  Negative for chest pain and leg swelling.  ?Gastrointestinal: Negative.  Negative for abdominal pain, constipation, diarrhea, nausea and vomiting.  ?Genitourinary: Negative.  Negative for dysuria and flank pain.  ?Musculoskeletal: Negative.  Negative for myalgias.  ?Skin: Negative.  Negative for rash.  ?Neurological: Negative.  Negative for sensory change, weakness and headaches.  ?Psychiatric/Behavioral: Negative.  The patient is not nervous/anxious and does not have insomnia.   ? ?As per HPI. Otherwise, a complete review of systems is negative. ? ?PAST MEDICAL HISTORY: ?Past Medical History:  ?Diagnosis Date  ? Anxiety   ? Arthritis   ? Breast cancer [place] 2014  ? Right breast cancer - chemo, radiation and Mastectomy  ?  CH[place] ([place]ive [place]) [place] 2013  ? Chronic kidney disease   ? [place]ot[place]   ? blood clots  ? Depression   ? GERD (gastroesophageal reflux disease)   ? GI bleed   ? H/O heart artery stent   ? Headache   ? Heart attack [facility] 2012  ? coronary stent x2 completed by Dr. CClayborn Bigness  ? Heart attack [place] Apr 29, 2013  ? Hemorrhoids   ? Hypertension   ? Hypothyroidism   ? IBS (irritable bowel syndrome)   ? Malignant neoplasm of lower-inner quadrant of female breast ([facility] January 20, 2013.  ? invasive mammary cancer, minimal 0.85 cm.  Histologic grade 1.  ? Personal history of chemotherapy 2014  ? BREAST CA  ? Personal history of radiation therapy 2014  ? BREAST CA  ? Thyroid disease   ? ? ?PAST SURGICAL HISTORY: ?Past Surgical History:  ?Procedure Laterality Date  ? BOTOX INJECTION N/A 03/14/2017  ? Procedure: BOTOX INJECTION;  Surgeon: PJules Husbands MD;  Location: [facility] ORS;  Service: General;  Laterality: N/A;  ? BOTOX INJECTION N/A 05/09/2018  ? Procedure: BOTOX INJECTION;  Surgeon: PJules Husbands MD;  Location: [facility] ORS;  Service: General;  Laterality: N/A;  ? BREAST BIOPSY Right 2014  ? positive  ? BREAST SURGERY Right 02-21-2013  ? right mastectomy  ? CARDIAC CATHETERIZATION    ? COLONOSCOPY W/ BIOPSIES  09/15/2010  ? Colonoscopy completed by PVerdie Shire M.D. Tubular adenoma of the ascending colon and descending colon reported up to 0.4 cm in diameter. No atypia.  ? COLONOSCOPY WITH PROPOFOL N/A 12/23/2016  ? Procedure: COLONOSCOPY WITH PROPOFOL;  Surgeon: SWilford Corner MD;  Location: [facility] ENDOSCOPY;  Service: Endoscopy;  Laterality: N/A;  ? ESOPHAGOGASTRODUODENOSCOPY N/A 12/13/2016  ? Procedure: ESOPHAGOGASTRODUODENOSCOPY (EGD);  Surgeon: [name], MD;  Location: [facility] ENDOSCOPY;  Service: Endoscopy;  Laterality: N/A;  ? LEFT HEART CATH N/A 04/02/2020  ? Procedure: Left Heart Cath with Coronary Angiography;  Surgeon: Dionisio David, MD;  Location: [facility] CV LAB;  Service: Cardiovascular;   Laterality: N/A;  ? LEFT HEART CATH AND CORONARY ANGIOGRAPHY Left 08/29/2018  ? Procedure: LEFT HEART CATH AND CORONARY ANGIOGRAPHY;  Surgeon: Dionisio David, MD;  Location: [facility] CV LAB;  Service: Cardiovascular;  Laterality: Left;  ? MASTECTOMY Right 2014  ? BREAST CA  ? PORTA CATH INSERTION    ? SPHINCTEROTOMY N/A 05/09/2018  ? Procedure: SPHINCTEROTOMY;  Surgeon: Jules Husbands, MD;  Location: [facility] ORS;  Service: General;  Laterality: N/A;  ? UPPER GI ENDOSCOPY  09/15/2010    ? Completed by Verdie Shire, M.D. for nausea. Normal exam reported.  ? VASCULAR SURGERY    ? ? ?FAMILY HISTORY ?Family History  ?Problem Relation Age of Onset  ? Breast cancer Mother 51  ? Cancer Father   ?     colon  ? ? ?  ? ADVANCED DIRECTIVES:  ? ? ?HEALTH MAINTENANCE: ?Social History  ? ?Tobacco Use  ? Smoking status: Former  ?  Years: 20.00  ?  Types: Cigarettes  ? Smokeless tobacco: Never  ?Vaping Use  ? Vaping Use: Never used  ?Substance Use Topics  ? Alcohol use: No  ? Drug use: No  ? ? ? Colonoscopy: ? PAP: ? Bone density: ? Lipid panel: ? ?Allergies  ?Allergen Reactions  ? Nausea Control [Emetrol] Nausea And Vomiting  ? Other   ?  Note: burning, nausea with XR 75 mg  ? Hydroxyzine Rash and Hives  ? ? ?Current Outpatient Medications  ?Medication Sig Dispense Refill  ? acetaminophen (TYLENOL) 650 MG CR tablet Take 1,300 mg by mouth every 8 (eight) hours as needed for pain.    ? ALPRAZolam (XANAX) 0.25 MG tablet Take 0.25 mg by mouth 2 (two) times daily as needed for anxiety.   2  ? azelastine (ASTELIN) 0.1 % nasal spray Place 1 spray into both nostrils 2 (two) times daily as needed for rhinitis.  4  ? Budeson-Glycopyrrol-Formoterol (BREZTRI AEROSPHERE) 160-9-4.8 MCG/ACT AERO Inhale 2 puffs into the lungs 2 (two) times daily. 10.7 g 11  ? clopidogrel (PLAVIX) 75 MG tablet Take 75 mg by mouth daily.     ? dexlansoprazole (DEXILANT) 60 MG capsule Take 1 capsule (60 mg total) by mouth daily. 90 capsule 3  ? dicyclomine (BENTYL) 10  MG capsule TAKE 1 CAPSULE (10 MG TOTAL) BY MOUTH 2 (TWO) TIMES DAILY BEFORE A MEAL. 180 capsule 3  ? fexofenadine (ALLEGRA) 60 MG tablet Take 30 mg by mouth 2 (two) times daily as needed (allergies.).    ? gabapentin (NEURONTIN) 300 MG capsule Take 1 capsule (300 mg total) by mouth 3 (three) times daily. 90 capsule 0  ? hydrALAZINE (APRESOLINE) 25 MG tablet Take 25 mg by mouth 3 (three) times daily.    ? isosorbide mononitrate (IMDUR) 30 MG 24 hr tablet Take 30 mg by mouth daily with breakfast.    ? letrozole (FEMARA) 2.5 MG tablet TAKE 1 TABLET EVERY DAY 90 tablet 1  ? levothyroxine (SYNTHROID, LEVOTHROID) 88 MCG tablet Take 88 mcg by mouth daily before breakfast.     ? Magnesium 250 MG TABS Take by mouth.    ?  meclizine (ANTIVERT) 25 MG tablet Take 2 tablets (50 mg total) by mouth 3 (three) times daily as needed for dizziness or nausea. 30 tablet 1  ? montelukast (SINGULAIR) 10 MG tablet Take 10 mg by mouth at bedtime.    ? ondansetron (ZOFRAN) 4 MG tablet     ? pantoprazole (PROTONIX) 40 MG tablet Take by mouth.    ? predniSONE (DELTASONE) 10 MG tablet Take one tab 3 x day for 3 days, then take one tab 2 x a day for 3 days and then take one tab a day for 3 days for copd 18 tablet 0  ? promethazine (PHENERGAN) 12.5 MG tablet Take 12.5 mg by mouth every 6 (six) hours as needed for nausea or vomiting.    ? ranolazine (RANEXA) 1000 MG SR tablet Take 500 mg by mouth 2 (two) times daily.    ? temazepam (RESTORIL) 30 MG capsule Take 30 mg by mouth at bedtime.    ? traZODone (DESYREL) 50 MG tablet Take 50 mg by mouth at bedtime.     ? albuterol (VENTOLIN HFA) 108 (90 Base) MCG/ACT inhaler Inhale into the lungs. (Patient not taking: Reported on 11/03/2021)    ? ?No current facility-administered medications for this visit.  ? ?Facility-Administered Medications Ordered in Other Visits  ?Medication Dose Route Frequency Provider Last Rate Last Admin  ? sodium chloride flush (NS) 0.9 % injection 3 mL  3 mL Intravenous Q12H  Dionisio David, MD      ? ? ?OBJECTIVE: ?Vitals:  ? 11/03/21 1422  ?BP: 108/67  ?Pulse: 80  ?Resp: 16  ?Temp: 98.2 ?F (36.8 ?C)  ?SpO2: 98%  ?   Body mass index is 29.57 kg/m?Marland Kitchen    ECOG FS:0 - Asymptomatic ?

## 2021-11-02 ENCOUNTER — Other Ambulatory Visit: Payer: Self-pay

## 2021-11-02 DIAGNOSIS — C50311 Malignant neoplasm of lower-inner quadrant of right female breast: Secondary | ICD-10-CM

## 2021-11-02 DIAGNOSIS — M810 Age-related osteoporosis without current pathological fracture: Secondary | ICD-10-CM

## 2021-11-03 ENCOUNTER — Inpatient Hospital Stay (HOSPITAL_BASED_OUTPATIENT_CLINIC_OR_DEPARTMENT_OTHER): Payer: Medicare HMO | Admitting: Oncology

## 2021-11-03 ENCOUNTER — Ambulatory Visit: Payer: Medicare HMO

## 2021-11-03 ENCOUNTER — Inpatient Hospital Stay: Payer: Medicare HMO

## 2021-11-03 ENCOUNTER — Inpatient Hospital Stay: Payer: Medicare HMO | Attending: Oncology

## 2021-11-03 VITALS — BP 108/67 | HR 80 | Temp 98.2°F | Resp 16 | Ht 66.0 in | Wt 183.2 lb

## 2021-11-03 DIAGNOSIS — M818 Other osteoporosis without current pathological fracture: Secondary | ICD-10-CM | POA: Insufficient documentation

## 2021-11-03 DIAGNOSIS — M810 Age-related osteoporosis without current pathological fracture: Secondary | ICD-10-CM

## 2021-11-03 DIAGNOSIS — Z170 Estrogen receptor positive status [ER+]: Secondary | ICD-10-CM

## 2021-11-03 DIAGNOSIS — C50311 Malignant neoplasm of lower-inner quadrant of right female breast: Secondary | ICD-10-CM | POA: Diagnosis not present

## 2021-11-03 LAB — BASIC METABOLIC PANEL
Anion gap: 6 (ref 5–15)
BUN: 17 mg/dL (ref 8–23)
CO2: 24 mmol/L (ref 22–32)
Calcium: 9 mg/dL (ref 8.9–10.3)
Chloride: 97 mmol/L — ABNORMAL LOW (ref 98–111)
Creatinine, Ser: 1.5 mg/dL — ABNORMAL HIGH (ref 0.44–1.00)
GFR, Estimated: 37 mL/min — ABNORMAL LOW (ref >60)
Glucose, Bld: 120 mg/dL — ABNORMAL HIGH (ref 70–99)
Potassium: 3.8 mmol/L (ref 3.5–5.1)
Sodium: 127 mmol/L — ABNORMAL LOW (ref 135–145)

## 2021-11-03 MED ORDER — ZOLEDRONIC ACID 4 MG/5ML IV CONC
3.3000 mg | Freq: Once | INTRAVENOUS | Status: AC
  Administered 2021-11-03: 3.3 mg via INTRAVENOUS
  Filled 2021-11-03: qty 4.13

## 2021-11-03 MED ORDER — SODIUM CHLORIDE 0.9 % IV SOLN
Freq: Once | INTRAVENOUS | Status: AC
  Filled 2021-11-03: qty 250

## 2021-11-03 NOTE — Progress Notes (Signed)
Pt recently dx with COPD. Pt c/o muscle spasms and pain on side of mastectomy. ?

## 2021-11-03 NOTE — Patient Instructions (Signed)

## 2021-11-04 ENCOUNTER — Encounter: Payer: Self-pay | Admitting: Oncology

## 2021-11-04 LAB — CANCER ANTIGEN 27.29: CA 27.29: 28.6 U/mL (ref 0.0–38.6)

## 2021-11-15 ENCOUNTER — Ambulatory Visit: Payer: Medicare HMO | Admitting: Internal Medicine

## 2021-11-15 DIAGNOSIS — Z13820 Encounter for screening for osteoporosis: Secondary | ICD-10-CM | POA: Diagnosis not present

## 2021-11-15 DIAGNOSIS — M810 Age-related osteoporosis without current pathological fracture: Secondary | ICD-10-CM | POA: Diagnosis not present

## 2021-11-29 DIAGNOSIS — D631 Anemia in chronic kidney disease: Secondary | ICD-10-CM | POA: Diagnosis not present

## 2021-11-29 DIAGNOSIS — I129 Hypertensive chronic kidney disease with stage 1 through stage 4 chronic kidney disease, or unspecified chronic kidney disease: Secondary | ICD-10-CM | POA: Diagnosis not present

## 2021-11-29 DIAGNOSIS — E871 Hypo-osmolality and hyponatremia: Secondary | ICD-10-CM | POA: Diagnosis not present

## 2021-11-29 DIAGNOSIS — N1832 Chronic kidney disease, stage 3b: Secondary | ICD-10-CM | POA: Diagnosis not present

## 2021-12-07 DIAGNOSIS — J449 Chronic obstructive pulmonary disease, unspecified: Secondary | ICD-10-CM | POA: Diagnosis not present

## 2021-12-14 DIAGNOSIS — E039 Hypothyroidism, unspecified: Secondary | ICD-10-CM | POA: Diagnosis not present

## 2021-12-14 DIAGNOSIS — I10 Essential (primary) hypertension: Secondary | ICD-10-CM | POA: Diagnosis not present

## 2021-12-14 DIAGNOSIS — E782 Mixed hyperlipidemia: Secondary | ICD-10-CM | POA: Diagnosis not present

## 2021-12-27 DIAGNOSIS — N1832 Chronic kidney disease, stage 3b: Secondary | ICD-10-CM | POA: Diagnosis not present

## 2022-01-12 DIAGNOSIS — E782 Mixed hyperlipidemia: Secondary | ICD-10-CM | POA: Diagnosis not present

## 2022-01-12 DIAGNOSIS — I10 Essential (primary) hypertension: Secondary | ICD-10-CM | POA: Diagnosis not present

## 2022-01-12 DIAGNOSIS — E669 Obesity, unspecified: Secondary | ICD-10-CM | POA: Diagnosis not present

## 2022-01-12 DIAGNOSIS — R7301 Impaired fasting glucose: Secondary | ICD-10-CM | POA: Diagnosis not present

## 2022-01-12 DIAGNOSIS — E039 Hypothyroidism, unspecified: Secondary | ICD-10-CM | POA: Diagnosis not present

## 2022-01-13 DIAGNOSIS — M25551 Pain in right hip: Secondary | ICD-10-CM | POA: Diagnosis not present

## 2022-01-13 DIAGNOSIS — F411 Generalized anxiety disorder: Secondary | ICD-10-CM | POA: Diagnosis not present

## 2022-01-13 DIAGNOSIS — J309 Allergic rhinitis, unspecified: Secondary | ICD-10-CM | POA: Diagnosis not present

## 2022-01-13 DIAGNOSIS — M5459 Other low back pain: Secondary | ICD-10-CM | POA: Diagnosis not present

## 2022-01-13 DIAGNOSIS — N189 Chronic kidney disease, unspecified: Secondary | ICD-10-CM | POA: Diagnosis not present

## 2022-01-13 DIAGNOSIS — E039 Hypothyroidism, unspecified: Secondary | ICD-10-CM | POA: Diagnosis not present

## 2022-01-16 ENCOUNTER — Ambulatory Visit
Admission: RE | Admit: 2022-01-16 | Discharge: 2022-01-16 | Disposition: A | Discharge status: Home / Self Care | Payer: Medicare HMO | Source: Ambulatory Visit | Attending: Internal Medicine | Admitting: Internal Medicine

## 2022-01-16 DIAGNOSIS — R911 Solitary pulmonary nodule: Secondary | ICD-10-CM | POA: Insufficient documentation

## 2022-01-16 DIAGNOSIS — M25551 Pain in right hip: Secondary | ICD-10-CM | POA: Diagnosis not present

## 2022-01-24 ENCOUNTER — Encounter: Payer: Self-pay | Admitting: Internal Medicine

## 2022-01-24 ENCOUNTER — Ambulatory Visit: Payer: Medicare HMO | Admitting: Internal Medicine

## 2022-01-24 VITALS — BP 110/70 | HR 74 | Temp 97.8°F | Resp 16 | Ht 66.0 in | Wt 180.0 lb

## 2022-01-24 DIAGNOSIS — R911 Solitary pulmonary nodule: Secondary | ICD-10-CM

## 2022-01-24 DIAGNOSIS — I509 Heart failure, unspecified: Secondary | ICD-10-CM | POA: Diagnosis not present

## 2022-01-24 DIAGNOSIS — J441 Chronic obstructive pulmonary disease with (acute) exacerbation: Secondary | ICD-10-CM

## 2022-01-24 NOTE — Progress Notes (Signed)
[facility] [place], [place] [address]  Pulmonary Sleep Medicine   Office Visit Note  Patient Name: Abigail Ortega DOB: [birth year] MRN [id number]  Date of Service: 01/24/2022  Complaints/HPI: Pulmonary Nodule follow up. She had a CT scan of the chest done which had revealed a nodule we did a follow up and this shows the nodule to be resolved and second nodule was unchanged. The scan will need to be repeated due to the findings of the second nodule. NO chest pain no cough and no congestion. Denies fevers or chills noted at this time  ROS  General: (-) fever, (-) chills, (-) night sweats, (-) weakness Skin: (-) rashes, (-) itching,. Eyes: (-) visual changes, (-) redness, (-) itching. Nose and Sinuses: (-) nasal stuffiness or itchiness, (-) postnasal drip, (-) nosebleeds, (-) sinus trouble. Mouth and Throat: (-) sore throat, (-) hoarseness. Neck: (-) swollen glands, (-) enlarged thyroid, (-) neck pain. Respiratory: - cough, (-) bloody sputum, + shortness of breath, - wheezing. Cardiovascular: - ankle swelling, (-) chest pain. Lymphatic: (-) lymph node enlargement. Neurologic: (-) numbness, (-) tingling. Psychiatric: (-) anxiety, (-) depression   Current Medication: Outpatient Encounter Medications as of 01/24/2022  Medication Sig Note   acetaminophen (TYLENOL) 650 MG CR tablet Take 1,300 mg by mouth every 8 (eight) hours as needed for pain. 11/03/2020: Prn    albuterol (VENTOLIN HFA) 108 (90 Base) MCG/ACT inhaler Inhale into the lungs.    ALPRAZolam (XANAX) 0.25 MG tablet Take 0.25 mg by mouth 2 (two) times daily as needed for anxiety.     azelastine (ASTELIN) 0.1 % nasal spray Place 1 spray into both nostrils 2 (two) times daily as needed for rhinitis.    Budeson-Glycopyrrol-Formoterol (BREZTRI AEROSPHERE) 160-9-4.8 MCG/ACT AERO Inhale 2 puffs into the lungs 2 (two) times daily.    clopidogrel (PLAVIX) 75 MG tablet Take 75 mg by mouth daily.      dexlansoprazole (DEXILANT) 60 MG capsule Take 1 capsule (60 mg total) by mouth daily.    fexofenadine (ALLEGRA) 60 MG tablet Take 30 mg by mouth 2 (two) times daily as needed (allergies.). 11/03/2020: prn   gabapentin (NEURONTIN) 300 MG capsule Take 1 capsule (300 mg total) by mouth 3 (three) times daily.    hydrALAZINE (APRESOLINE) 25 MG tablet Take 25 mg by mouth 3 (three) times daily.    isosorbide mononitrate (IMDUR) 30 MG 24 hr tablet Take 30 mg by mouth daily with breakfast.    letrozole (FEMARA) 2.5 MG tablet TAKE 1 TABLET EVERY DAY    levothyroxine (SYNTHROID, LEVOTHROID) 88 MCG tablet Take 88 mcg by mouth daily before breakfast.     Magnesium 250 MG TABS Take by mouth.    meclizine (ANTIVERT) 25 MG tablet Take 2 tablets (50 mg total) by mouth 3 (three) times daily as needed for dizziness or nausea. 11/03/2020: prn   montelukast (SINGULAIR) 10 MG tablet Take 10 mg by mouth at bedtime.    ondansetron (ZOFRAN) 4 MG tablet     pantoprazole (PROTONIX) 40 MG tablet Take by mouth.    predniSONE (DELTASONE) 10 MG tablet Take one tab 3 x day for 3 days, then take one tab 2 x a day for 3 days and then take one tab a day for 3 days for copd    promethazine (PHENERGAN) 12.5 MG tablet Take 12.5 mg by mouth every 6 (six) hours as needed for nausea or vomiting.    ranolazine (RANEXA) 1000 MG SR tablet Take 500  mg by mouth 2 (two) times daily.    temazepam (RESTORIL) 30 MG capsule Take 30 mg by mouth at bedtime.    traZODone (DESYREL) 50 MG tablet Take 50 mg by mouth at bedtime.     dicyclomine (BENTYL) 10 MG capsule TAKE 1 CAPSULE (10 MG TOTAL) BY MOUTH 2 (TWO) TIMES DAILY BEFORE A MEAL.    Facility-Administered Encounter Medications as of 01/24/2022  Medication   sodium chloride flush (NS) 0.9 % injection 3 mL    Surgical History: Past Surgical History:  Procedure Laterality Date   BOTOX INJECTION N/A 03/14/2017   Procedure: BOTOX INJECTION;  Surgeon: Jules Husbands, MD;  Location: [facility] ORS;   Service: General;  Laterality: N/A;   BOTOX INJECTION N/A 05/09/2018   Procedure: BOTOX INJECTION;  Surgeon: Jules Husbands, MD;  Location: [facility] ORS;  Service: General;  Laterality: N/A;   BREAST BIOPSY Right 2014   positive   BREAST SURGERY Right 02-21-2013   right mastectomy   CARDIAC CATHETERIZATION     COLONOSCOPY W/ BIOPSIES  09/15/2010   Colonoscopy completed by Verdie Shire, M.D. Tubular adenoma of the ascending colon and descending colon reported up to 0.4 cm in diameter. No atypia.   COLONOSCOPY WITH PROPOFOL N/A 12/23/2016   Procedure: COLONOSCOPY WITH PROPOFOL;  Surgeon: Wilford Corner, MD;  Location: [facility] ENDOSCOPY;  Service: Endoscopy;  Laterality: N/A;   ESOPHAGOGASTRODUODENOSCOPY N/A 12/13/2016   Procedure: ESOPHAGOGASTRODUODENOSCOPY (EGD);  Surgeon: [name], MD;  Location: [facility] ENDOSCOPY;  Service: Endoscopy;  Laterality: N/A;   LEFT HEART CATH N/A 04/02/2020   Procedure: Left Heart Cath with Coronary Angiography;  Surgeon: Dionisio David, MD;  Location: [place] CV LAB;  Service: Cardiovascular;  Laterality: N/A;   LEFT HEART CATH AND CORONARY ANGIOGRAPHY Left 08/29/2018   Procedure: LEFT HEART CATH AND CORONARY ANGIOGRAPHY;  Surgeon: Dionisio David, MD;  Location: [place] CV LAB;  Service: Cardiovascular;  Laterality: Left;   MASTECTOMY Right 2014   BREAST CA   PORTA CATH INSERTION     SPHINCTEROTOMY N/A 05/09/2018   Procedure: SPHINCTEROTOMY;  Surgeon: Jules Husbands, MD;  Location: [facility] ORS;  Service: General;  Laterality: N/A;   UPPER GI ENDOSCOPY  09/15/2010     Completed by Verdie Shire, M.D. for nausea. Normal exam reported.   VASCULAR SURGERY      Medical History: Past Medical History:  Diagnosis Date   Anxiety    Arthritis    Breast cancer ([place]) 2014   Right breast cancer - chemo, radiation and Mastectomy   CHF (congestive heart failure) ([place]) 2013   Chronic kidney disease    Clotting disorder (HCC)    blood clots   Depression    GERD  (gastroesophageal reflux disease)    GI bleed    H/O heart artery stent    Headache    Heart attack ([place]) 2012   coronary stent x2 completed by Dr. Clayborn Bigness.   Heart attack ([place]) Apr 29, 2013   Hemorrhoids    Hypertension    Hypothyroidism    IBS (irritable bowel syndrome)    Malignant neoplasm of lower-inner quadrant of female breast ([place]) January 20, 2013.   invasive mammary cancer, minimal 0.85 cm.  Histologic grade 1.   Personal history of chemotherapy 2014   BREAST CA   Personal history of radiation therapy 2014   BREAST CA   Thyroid disease     Family History: Family History  Problem Relation Age of Onset   Breast cancer  Mother 38   Cancer Father        colon    Social History: Social History   Socioeconomic History   Marital status: Divorced    Spouse name: Not on file   Number of children: Not on file   Years of education: Not on file   Highest education level: Not on file  Occupational History   Not on file  Tobacco Use   Smoking status: Former    Years: 20.00    Types: Cigarettes   Smokeless tobacco: Never  Vaping Use   Vaping Use: Never used  Substance and Sexual Activity   Alcohol use: No   Drug use: No   Sexual activity: Never  Other Topics Concern   Not on file  Social History Narrative   Not on file   Social Determinants of Health   Financial Resource Strain: Not on file  Food Insecurity: Not on file  Transportation Needs: Not on file  Physical Activity: Not on file  Stress: Not on file  Social Connections: Not on file  Intimate Partner Violence: Not on file    Vital Signs: Blood pressure 110/70, pulse 74, temperature 97.8 F (36.6 C), resp. rate 16, height '5\' 6"'$  (1.676 m), weight 180 lb (81.6 kg), SpO2 97 %.  Examination: General Appearance: The patient is well-developed, well-nourished, and in no distress. Skin: Gross inspection of skin unremarkable. Head: normocephalic, no gross deformities. Eyes: no gross deformities  noted. ENT: ears appear grossly normal no exudates. Neck: Supple. No thyromegaly. No LAD. Respiratory: no rhonchi noted. Cardiovascular: Normal S1 and S2 without murmur or rub. Extremities: No cyanosis. pulses are equal. Neurologic: Alert and oriented. No involuntary movements.  LABS: Recent Results (from the past 2160 hour(s))  Cancer antigen 27.29     Status: None   Collection Time: 11/03/21  2:03 PM  Result Value Ref Range   CA 27.29 28.6 0.0 - 38.6 U/mL    Comment: (NOTE) Siemens Centaur Immunochemiluminometric Methodology (ICMA) Values obtained with different assay methods or kits cannot be used interchangeably. Results cannot be interpreted as absolute evidence of the presence or absence of malignant disease. Performed At: [facility] [place], [place] [id number] Rush Farmer MD [id number]   Basic metabolic panel     Status: Abnormal   Collection Time: 11/03/21  2:03 PM  Result Value Ref Range   Sodium 127 (L) 135 - 145 mmol/L   Potassium 3.8 3.5 - 5.1 mmol/L   Chloride 97 (L) 98 - 111 mmol/L   CO2 24 22 - 32 mmol/L   Glucose, Bld 120 (H) 70 - 99 mg/dL    Comment: Glucose reference range applies only to samples taken after fasting for at least 8 hours.   BUN 17 8 - 23 mg/dL   Creatinine, Ser 1.50 (H) 0.44 - 1.00 mg/dL   Calcium 9.0 8.9 - 10.3 mg/dL   GFR, Estimated 37 (L) >60 mL/min    Comment: (NOTE) Calculated using the CKD-EPI Creatinine Equation (2021)    Anion gap 6 5 - 15    Comment: Performed at [facility], [address], [place], [place] [address]    Radiology: CT CHEST NODULE FOLLOW UP LOW DOSE W/O  Result Date: 01/16/2022 CLINICAL DATA:  Follow-up pulmonary nodule. History of right breast cancer status post mastectomy and chemoradiation therapy in 2014. * Tracking Code: BO * EXAM: CT CHEST WITHOUT CONTRAST TECHNIQUE: Multidetector CT imaging of the chest was performed following the standard protocol without  IV  contrast. RADIATION DOSE REDUCTION: This exam was performed according to the departmental dose-optimization program which includes automated exposure control, adjustment of the mA and/or kV according to patient size and/or use of iterative reconstruction technique. COMPARISON:  09/08/2021 chest CT. FINDINGS: Cardiovascular: Normal heart size. No significant pericardial effusion/thickening. Three-vessel coronary atherosclerosis. Atherosclerotic nonaneurysmal thoracic aorta. Top-normal caliber main pulmonary artery (3.3 cm diameter). Mediastinum/Nodes: No discrete thyroid nodules. Unremarkable esophagus. No pathologically enlarged axillary, mediastinal or hilar lymph nodes, noting limited sensitivity for the detection of hilar adenopathy on this noncontrast study. Lungs/Pleura: No pneumothorax. No pleural effusion. No acute consolidative airspace disease or lung masses. Sharply marginated patchy consolidation and ground-glass opacity in apical right upper lobe and anterior right middle and right upper lobes with associated mild volume loss and distortion, unchanged, compatible with radiation fibrosis. Previously visualized 0.7 cm solid anterior right middle lobe pulmonary nodule on 09/08/2021 chest CT study is absent on today's scan. Indistinct 0.3 cm left upper lobe pulmonary nodule (series 4/image 146), unchanged from 09/08/2021 CT, not definitely seen on 02/26/2014 CT. No new significant pulmonary nodules. Upper abdomen: Small hiatal hernia. Musculoskeletal: No aggressive appearing focal osseous lesions. Mild thoracic spondylosis. Stable postsurgical changes from right mastectomy. IMPRESSION: 1. Previously visualized 0.7 cm solid anterior right middle lobe pulmonary nodule on 09/08/2021 chest CT study is absent on today's scan, compatible with resolved inflammatory nodule. 2. No findings highly suspicious for metastatic disease in the chest. Indistinct 0.3 cm left upper lobe pulmonary nodule, unchanged from  09/08/2021 CT, not definitely seen on 02/26/2014 CT, probably benign. Suggest attention on follow-up noncontrast chest CT in 6-12 months. 3. Three-vessel coronary atherosclerosis. 4. Small hiatal hernia. 5. Aortic Atherosclerosis (ICD10-I70.0). Electronically Signed   By: Ilona Sorrel M.D.   On: 01/16/2022 16:00    No results found.  CT CHEST NODULE FOLLOW UP LOW DOSE W/O  Result Date: 01/16/2022 CLINICAL DATA:  Follow-up pulmonary nodule. History of right breast cancer status post mastectomy and chemoradiation therapy in 2014. * Tracking Code: BO * EXAM: CT CHEST WITHOUT CONTRAST TECHNIQUE: Multidetector CT imaging of the chest was performed following the standard protocol without IV contrast. RADIATION DOSE REDUCTION: This exam was performed according to the departmental dose-optimization program which includes automated exposure control, adjustment of the mA and/or kV according to patient size and/or use of iterative reconstruction technique. COMPARISON:  09/08/2021 chest CT. FINDINGS: Cardiovascular: Normal heart size. No significant pericardial effusion/thickening. Three-vessel coronary atherosclerosis. Atherosclerotic nonaneurysmal thoracic aorta. Top-normal caliber main pulmonary artery (3.3 cm diameter). Mediastinum/Nodes: No discrete thyroid nodules. Unremarkable esophagus. No pathologically enlarged axillary, mediastinal or hilar lymph nodes, noting limited sensitivity for the detection of hilar adenopathy on this noncontrast study. Lungs/Pleura: No pneumothorax. No pleural effusion. No acute consolidative airspace disease or lung masses. Sharply marginated patchy consolidation and ground-glass opacity in apical right upper lobe and anterior right middle and right upper lobes with associated mild volume loss and distortion, unchanged, compatible with radiation fibrosis. Previously visualized 0.7 cm solid anterior right middle lobe pulmonary nodule on 09/08/2021 chest CT study is absent on today's  scan. Indistinct 0.3 cm left upper lobe pulmonary nodule (series 4/image 146), unchanged from 09/08/2021 CT, not definitely seen on 02/26/2014 CT. No new significant pulmonary nodules. Upper abdomen: Small hiatal hernia. Musculoskeletal: No aggressive appearing focal osseous lesions. Mild thoracic spondylosis. Stable postsurgical changes from right mastectomy. IMPRESSION: 1. Previously visualized 0.7 cm solid anterior right middle lobe pulmonary nodule on 09/08/2021 chest CT study is absent on today's scan, compatible  with resolved inflammatory nodule. 2. No findings highly suspicious for metastatic disease in the chest. Indistinct 0.3 cm left upper lobe pulmonary nodule, unchanged from 09/08/2021 CT, not definitely seen on 02/26/2014 CT, probably benign. Suggest attention on follow-up noncontrast chest CT in 6-12 months. 3. Three-vessel coronary atherosclerosis. 4. Small hiatal hernia. 5. Aortic Atherosclerosis (ICD10-I70.0). Electronically Signed   By: Ilona Sorrel M.D.   On: 01/16/2022 16:00      Assessment and Plan: Patient Active Problem List   Diagnosis Date Noted   Osteoporosis 10/25/2021   Coronary syndrome, acute ([place]) 08/23/2018   Anal fissure    Personal history of colonic polyps 12/23/2016   Hyponatremia 12/21/2016   GIB (gastrointestinal bleeding) 12/12/2016   Depressive disorder 01/14/2016   Headache 01/14/2016   Joint pain 01/14/2016   Myocardial infarction ([place]) 01/14/2016   Obesity (BMI 35.0-39.9 without comorbidity) 08/20/2015   Anal pain 01/13/2015   Anxiety 12/16/2013   Breast cancer ([place]) 12/16/2013   CAD (coronary artery disease) 12/16/2013   Hyperlipidemia, unspecified 12/16/2013   Hypertension 12/16/2013   Hypothyroid 12/16/2013   Nausea 12/16/2013   Acquired absence of breast 03/04/2013   Congestive heart failure ([place]) 03/04/2013   Breast cancer of lower-inner quadrant of right female breast ([place]) 01/28/2013    1. Chronic obstructive pulmonary disease with  acute exacerbation ([place]) Continue with current management she is doing well with the current regimen therapy.  Patient's overall had less exacerbations.  Patient does tolerate Breztri  2. Solitary pulmonary nodule on lung CT Nodule of concern we will do a follow-up CT scan to evaluate and follow along - CT Chest Wo Contrast; Future  General Counseling: I have discussed the findings of the evaluation and examination with Mardene Celeste.  I have also discussed any further diagnostic evaluation thatmay be needed or ordered today. Ceclia verbalizes understanding of the findings of todays visit. We also reviewed her medications today and discussed drug interactions and side effects including but not limited excessive drowsiness and altered mental states. We also discussed that there is always a risk not just to her but also people around her. she has been encouraged to call the office with any questions or concerns that should arise related to todays visit.  No orders of the defined types were placed in this encounter.    Time spent: 42  I have personally obtained a history, examined the patient, evaluated laboratory and imaging results, formulated the assessment and plan and placed orders.    Allyne Gee, MD [facility] Sleep medicine

## 2022-01-24 NOTE — Patient Instructions (Signed)
Chronic Obstructive Pulmonary Disease  Chronic obstructive pulmonary disease (COPD) is a long-term (chronic) lung problem. When you have COPD, it is hard for air to get in and out of your lungs. Usually the condition gets worse over time, and your lungs will never return to normal. There are things you can do to keep yourself as healthy as possible. What are the causes? Smoking. This is the most common cause. Certain genes passed from parent to child (inherited). What increases the risk? Being exposed to secondhand smoke from cigarettes, pipes, or cigars. Being exposed to chemicals and other irritants, such as fumes and dust in the work environment. Having chronic lung conditions or infections. What are the signs or symptoms? Shortness of breath, especially during physical activity. A long-term cough with a large amount of thick mucus. Sometimes, the cough may not have any mucus (dry cough). Wheezing. Breathing quickly. Skin that looks gray or blue, especially in the fingers, toes, or lips. Feeling tired (fatigue). Weight loss. Chest tightness. Having infections often. Episodes when breathing symptoms become much worse (exacerbations). At the later stages of this disease, you may have swelling in the ankles, feet, or legs. How is this treated? Taking medicines. Quitting smoking, if you smoke. Rehabilitation. This includes steps to make your body work better. It may involve a team of specialists. Doing exercises. Making changes to your diet. Using oxygen. Lung surgery. Lung transplant. Comfort measures (palliative care). Follow these instructions at home: Medicines Take over-the-counter and prescription medicines only as told by your doctor. Talk to your doctor before taking any cough or allergy medicines. You may need to avoid medicines that cause your lungs to be dry. Lifestyle If you smoke, stop smoking. Smoking makes the problem worse. Do not smoke or use any products that  contain nicotine or tobacco. If you need help quitting, ask your doctor. Avoid being around things that make your breathing worse. This may include smoke, chemicals, and fumes. Stay active, but remember to rest as well. Learn and use tips on how to manage stress and control your breathing. Make sure you get enough sleep. Most adults need at least 7 hours of sleep every night. Eat healthy foods. Eat smaller meals more often. Rest before meals. Controlled breathing Learn and use tips on how to control your breathing as told by your doctor. Try: Breathing in (inhaling) through your nose for 1 second. Then, pucker your lips and breath out (exhale) through your lips for 2 seconds. Putting one hand on your belly (abdomen). Breathe in slowly through your nose for 1 second. Your hand on your belly should move out. Pucker your lips and breathe out slowly through your lips. Your hand on your belly should move in as you breathe out.  Controlled coughing Learn and use controlled coughing to clear mucus from your lungs. Follow these steps: Lean your head a little forward. Breathe in deeply. Try to hold your breath for 3 seconds. Keep your mouth slightly open while coughing 2 times. Spit any mucus out into a tissue. Rest and do the steps again 1 or 2 times as needed. General instructions Make sure you get all the shots (vaccines) that your doctor recommends. Ask your doctor about a flu shot and a pneumonia shot. Use oxygen therapy and pulmonary rehabilitation if told by your doctor. If you need home oxygen therapy, ask your doctor if you should buy a tool to measure your oxygen level (oximeter). Make a COPD action plan with your doctor. This helps you   to know what to do if you feel worse than usual. Manage any other conditions you have as told by your doctor. Avoid going outside when it is very hot, cold, or humid. Avoid people who have a sickness you can catch (contagious). Keep all follow-up  visits. Contact a doctor if: You cough up more mucus than usual. There is a change in the color or thickness of the mucus. It is harder to breathe than usual. Your breathing is faster than usual. You have trouble sleeping. You need to use your medicines more often than usual. You have trouble doing your normal activities such as getting dressed or walking around the house. Get help right away if: You have shortness of breath while resting. You have shortness of breath that stops you from: Being able to talk. Doing normal activities. Your chest hurts for longer than 5 minutes. Your skin color is more blue than usual. Your pulse oximeter shows that you have low oxygen for longer than 5 minutes. You have a fever. You feel too tired to breathe normally. These symptoms may represent a serious problem that is an emergency. Do not wait to see if the symptoms will go away. Get medical help right away. Call your local emergency services (911 in the [place] Do not drive yourself to the hospital. Summary Chronic obstructive pulmonary disease (COPD) is a long-term lung problem. The way your lungs work will never return to normal. Usually the condition gets worse over time. There are things you can do to keep yourself as healthy as possible. Take over-the-counter and prescription medicines only as told by your doctor. If you smoke, stop. Smoking makes the problem worse. This information is not intended to replace advice given to you by your health care provider. Make sure you discuss any questions you have with your health care provider. Document Revised: 05/11/2020 Document Reviewed: 05/11/2020 Elsevier Patient Education  2023 [employer]  

## 2022-01-27 DIAGNOSIS — M25551 Pain in right hip: Secondary | ICD-10-CM | POA: Diagnosis not present

## 2022-01-27 DIAGNOSIS — K219 Gastro-esophageal reflux disease without esophagitis: Secondary | ICD-10-CM | POA: Diagnosis not present

## 2022-01-27 DIAGNOSIS — J309 Allergic rhinitis, unspecified: Secondary | ICD-10-CM | POA: Diagnosis not present

## 2022-01-27 DIAGNOSIS — M810 Age-related osteoporosis without current pathological fracture: Secondary | ICD-10-CM | POA: Diagnosis not present

## 2022-01-30 ENCOUNTER — Ambulatory Visit
Admission: RE | Admit: 2022-01-30 | Discharge: 2022-01-30 | Disposition: A | Discharge status: Home / Self Care | Payer: Medicare HMO | Source: Ambulatory Visit | Attending: Nurse Practitioner | Admitting: Nurse Practitioner

## 2022-01-30 ENCOUNTER — Ambulatory Visit
Admission: RE | Admit: 2022-01-30 | Discharge: 2022-01-30 | Disposition: A | Discharge status: Home / Self Care | Payer: Medicare HMO | Source: Ambulatory Visit | Attending: Oncology | Admitting: Oncology

## 2022-01-30 DIAGNOSIS — M810 Age-related osteoporosis without current pathological fracture: Secondary | ICD-10-CM | POA: Insufficient documentation

## 2022-01-30 DIAGNOSIS — Z1231 Encounter for screening mammogram for malignant neoplasm of breast: Secondary | ICD-10-CM | POA: Insufficient documentation

## 2022-01-30 DIAGNOSIS — Z780 Asymptomatic menopausal state: Secondary | ICD-10-CM | POA: Diagnosis not present

## 2022-02-01 ENCOUNTER — Other Ambulatory Visit: Payer: Self-pay | Admitting: Nurse Practitioner

## 2022-02-01 DIAGNOSIS — N6489 Other specified disorders of breast: Secondary | ICD-10-CM

## 2022-02-01 DIAGNOSIS — R928 Other abnormal and inconclusive findings on diagnostic imaging of breast: Secondary | ICD-10-CM

## 2022-02-13 DIAGNOSIS — E039 Hypothyroidism, unspecified: Secondary | ICD-10-CM | POA: Diagnosis not present

## 2022-02-13 DIAGNOSIS — E782 Mixed hyperlipidemia: Secondary | ICD-10-CM | POA: Diagnosis not present

## 2022-02-13 DIAGNOSIS — I10 Essential (primary) hypertension: Secondary | ICD-10-CM | POA: Diagnosis not present

## 2022-02-27 ENCOUNTER — Ambulatory Visit
Admission: RE | Admit: 2022-02-27 | Discharge: 2022-02-27 | Disposition: A | Discharge status: Home / Self Care | Payer: Medicare HMO | Source: Ambulatory Visit | Attending: Nurse Practitioner | Admitting: Nurse Practitioner

## 2022-02-27 DIAGNOSIS — R928 Other abnormal and inconclusive findings on diagnostic imaging of breast: Secondary | ICD-10-CM | POA: Diagnosis not present

## 2022-02-27 DIAGNOSIS — N6489 Other specified disorders of breast: Secondary | ICD-10-CM | POA: Diagnosis not present

## 2022-03-16 DIAGNOSIS — E039 Hypothyroidism, unspecified: Secondary | ICD-10-CM | POA: Diagnosis not present

## 2022-03-16 DIAGNOSIS — I10 Essential (primary) hypertension: Secondary | ICD-10-CM | POA: Diagnosis not present

## 2022-03-16 DIAGNOSIS — E782 Mixed hyperlipidemia: Secondary | ICD-10-CM | POA: Diagnosis not present

## 2022-04-17 ENCOUNTER — Other Ambulatory Visit: Payer: Self-pay | Admitting: Gastroenterology

## 2022-04-24 ENCOUNTER — Other Ambulatory Visit: Payer: Self-pay | Admitting: Oncology

## 2022-05-02 DIAGNOSIS — I10 Essential (primary) hypertension: Secondary | ICD-10-CM | POA: Diagnosis not present

## 2022-05-02 DIAGNOSIS — I340 Nonrheumatic mitral (valve) insufficiency: Secondary | ICD-10-CM | POA: Diagnosis not present

## 2022-05-02 DIAGNOSIS — I2510 Atherosclerotic heart disease of native coronary artery without angina pectoris: Secondary | ICD-10-CM | POA: Diagnosis not present

## 2022-05-02 DIAGNOSIS — Z9861 Coronary angioplasty status: Secondary | ICD-10-CM | POA: Diagnosis not present

## 2022-05-02 DIAGNOSIS — I351 Nonrheumatic aortic (valve) insufficiency: Secondary | ICD-10-CM | POA: Diagnosis not present

## 2022-05-05 ENCOUNTER — Inpatient Hospital Stay (HOSPITAL_BASED_OUTPATIENT_CLINIC_OR_DEPARTMENT_OTHER): Payer: Medicare HMO | Admitting: Nurse Practitioner

## 2022-05-05 ENCOUNTER — Other Ambulatory Visit: Payer: Self-pay

## 2022-05-05 ENCOUNTER — Inpatient Hospital Stay: Payer: Medicare HMO

## 2022-05-05 ENCOUNTER — Encounter: Payer: Self-pay | Admitting: Nurse Practitioner

## 2022-05-05 ENCOUNTER — Inpatient Hospital Stay: Payer: Medicare HMO | Attending: Nurse Practitioner

## 2022-05-05 VITALS — BP 122/72 | HR 73 | Temp 98.3°F | Resp 16 | Wt 180.0 lb

## 2022-05-05 DIAGNOSIS — M810 Age-related osteoporosis without current pathological fracture: Secondary | ICD-10-CM | POA: Diagnosis not present

## 2022-05-05 DIAGNOSIS — Z170 Estrogen receptor positive status [ER+]: Secondary | ICD-10-CM | POA: Insufficient documentation

## 2022-05-05 DIAGNOSIS — D631 Anemia in chronic kidney disease: Secondary | ICD-10-CM | POA: Diagnosis not present

## 2022-05-05 DIAGNOSIS — Z853 Personal history of malignant neoplasm of breast: Secondary | ICD-10-CM

## 2022-05-05 DIAGNOSIS — Z08 Encounter for follow-up examination after completed treatment for malignant neoplasm: Secondary | ICD-10-CM | POA: Diagnosis not present

## 2022-05-05 DIAGNOSIS — C50311 Malignant neoplasm of lower-inner quadrant of right female breast: Secondary | ICD-10-CM | POA: Diagnosis not present

## 2022-05-05 DIAGNOSIS — N1832 Chronic kidney disease, stage 3b: Secondary | ICD-10-CM | POA: Diagnosis not present

## 2022-05-05 LAB — COMPREHENSIVE METABOLIC PANEL
ALT: 15 U/L (ref 0–44)
AST: 21 U/L (ref 15–41)
Albumin: 3.8 g/dL (ref 3.5–5.0)
Alkaline Phosphatase: 67 U/L (ref 38–126)
Anion gap: 7 (ref 5–15)
BUN: 15 mg/dL (ref 8–23)
CO2: 26 mmol/L (ref 22–32)
Calcium: 8.6 mg/dL — ABNORMAL LOW (ref 8.9–10.3)
Chloride: 94 mmol/L — ABNORMAL LOW (ref 98–111)
Creatinine, Ser: 1.59 mg/dL — ABNORMAL HIGH (ref 0.44–1.00)
GFR, Estimated: 34 mL/min — ABNORMAL LOW (ref >60)
Glucose, Bld: 100 mg/dL — ABNORMAL HIGH (ref 70–99)
Potassium: 4.3 mmol/L (ref 3.5–5.1)
Sodium: 127 mmol/L — ABNORMAL LOW (ref 135–145)
Total Bilirubin: 0.5 mg/dL (ref 0.3–1.2)
Total Protein: 7.4 g/dL (ref 6.5–8.1)

## 2022-05-05 LAB — CBC
HCT: 30.4 % — ABNORMAL LOW (ref 36.0–46.0)
Hemoglobin: 10 g/dL — ABNORMAL LOW (ref 12.0–15.0)
MCH: 31.5 pg (ref 26.0–34.0)
MCHC: 32.9 g/dL (ref 30.0–36.0)
MCV: 95.9 fL (ref 80.0–100.0)
Platelets: 222 10*3/uL (ref 150–400)
RBC: 3.17 MIL/uL — ABNORMAL LOW (ref 3.87–5.11)
RDW: 11.9 % (ref 11.5–15.5)
WBC: 3.3 10*3/uL — ABNORMAL LOW (ref 4.0–10.5)
nRBC: 0 % (ref 0.0–0.2)

## 2022-05-05 MED ORDER — SODIUM CHLORIDE 0.9 % IV SOLN
Freq: Once | INTRAVENOUS | Status: AC
  Filled 2022-05-05: qty 250

## 2022-05-05 MED ORDER — ZOLEDRONIC ACID 4 MG/5ML IV CONC
3.3000 mg | Freq: Once | INTRAVENOUS | Status: AC
  Administered 2022-05-05: 3.3 mg via INTRAVENOUS
  Filled 2022-05-05: qty 4.13

## 2022-05-05 NOTE — Progress Notes (Signed)
[place]  [phone] [phone]  ID: Durwin Reges OB: 1950/08/03  MR#: [id number]  BWI#:[id number]  Patient Care Team: Perrin Maltese, MD as PCP - General (Internal Medicine) Bary Castilla, Forest Gleason, MD (General Surgery) Sharene Butters, MD (General Surgery)  CHIEF COMPLAINT: Pathologic stage IIIa triple positive invasive carcinoma of the lower inner quadrant of the right breast.   INTERVAL HISTORY: Patient returns to clinic today for routine 43-monthevaluation and consideration of Zometa for her osteoporosis. She has seasonal sinusitis. She currently feels well.  She has no neurologic complaints. She denies any recent fevers or illnesses.  She denies any chest pain, shortness of breath, cough, or hemoptysis.  She denies any vomiting, constipation, or diarrhea. She has no urinary complaints.  Patient offers no further specific complaints today.  REVIEW OF SYSTEMS:   Review of Systems  Constitutional: Negative.  Negative for chills, fever, malaise/fatigue and weight loss.  HENT: Negative.  Negative for tinnitus.   Eyes: Negative.   Respiratory: Negative.  Negative for cough and sputum production.   Cardiovascular: Negative.  Negative for chest pain and leg swelling.  Gastrointestinal: Negative.  Negative for abdominal pain, constipation, diarrhea, nausea and vomiting.  Genitourinary: Negative.  Negative for dysuria and flank pain.  Musculoskeletal: Negative.  Negative for myalgias.  Skin: Negative.  Negative for rash.  Neurological: Negative.  Negative for sensory change, weakness and headaches.  Psychiatric/Behavioral: Negative.  The patient is not nervous/anxious and does not have insomnia.   As per HPI. Otherwise, a complete review of systems is negative.  PAST MEDICAL HISTORY: Past Medical History:  Diagnosis Date   Anxiety    Arthritis    Breast cancer ([facility] 2014   Right breast cancer - chemo, radiation and Mastectomy   CHF  (congestive heart failure) ([facility] 2013   Chronic kidney disease    Clotting disorder (HCC)    blood clots   Depression    GERD (gastroesophageal reflux disease)    GI bleed    H/O heart artery stent    Headache    Heart attack ([facility] 2012   coronary stent x2 completed by Dr. CClayborn Bigness   Heart attack ([facility] Apr 29, 2013   Hemorrhoids    Hypertension    Hypothyroidism    IBS (irritable bowel syndrome)    Malignant neoplasm of lower-inner quadrant of female breast ([facility] January 20, 2013.   invasive mammary cancer, minimal 0.85 cm.  Histologic grade 1.   Personal history of chemotherapy 2014   BREAST CA   Personal history of radiation therapy 2014   BREAST CA   Thyroid disease     PAST SURGICAL HISTORY: Past Surgical History:  Procedure Laterality Date   BOTOX INJECTION N/A 03/14/2017   Procedure: BOTOX INJECTION;  Surgeon: PJules Husbands MD;  Location: [facility] ORS;  Service: General;  Laterality: N/A;   BOTOX INJECTION N/A 05/09/2018   Procedure: BOTOX INJECTION;  Surgeon: PJules Husbands MD;  Location: [facility] ORS;  Service: General;  Laterality: N/A;   BREAST BIOPSY Right 2014   positive   BREAST SURGERY Right 02-21-2013   right mastectomy   CARDIAC CATHETERIZATION     COLONOSCOPY W/ BIOPSIES  09/15/2010   Colonoscopy completed by PVerdie Shire M.D. Tubular adenoma of the ascending colon and descending colon reported up to 0.4 cm in diameter. No atypia.   COLONOSCOPY WITH PROPOFOL N/A 12/23/2016   Procedure: COLONOSCOPY WITH PROPOFOL;  Surgeon: SWilford Corner MD;  [place] [facility]  Service: Endoscopy;  Laterality: N/A;   ESOPHAGOGASTRODUODENOSCOPY N/A 12/13/2016   Procedure: ESOPHAGOGASTRODUODENOSCOPY (EGD);  Surgeon: [name], MD;  Location[facility] ENDOSCOPY;  Service: Endoscopy;  Laterality: N/A;   LEFT HEART CATH N/A 04/02/2020   Procedure: Left Heart Cath with Coronary Angiography;  Surgeon: Dionisio David, MD;  Location: [place] CV LAB;  Service: Cardiovascular;   Laterality: N/A;   LEFT HEART CATH AND CORONARY ANGIOGRAPHY Left 08/29/2018   Procedure: LEFT HEART CATH AND CORONARY ANGIOGRAPHY;  Surgeon: Dionisio David, MD;  Location: [place] CV LAB;  Service: Cardiovascular;  Laterality: Left;   MASTECTOMY Right 2014   BREAST CA   PORTA CATH INSERTION     SPHINCTEROTOMY N/A 05/09/2018   Procedure: SPHINCTEROTOMY;  Surgeon: Jules Husbands, MD;  Location: [facility] ORS;  Service: General;  Laterality: N/A;   UPPER GI ENDOSCOPY  09/15/2010     Completed by Verdie Shire, M.D. for nausea. Normal exam reported.   VASCULAR SURGERY      FAMILY HISTORY Family History  Problem Relation Age of Onset   Breast cancer Mother 32   Cancer Father        colon       ADVANCED DIRECTIVES:    HEALTH MAINTENANCE: Social History   Tobacco Use   Smoking status: Former    Years: 20.00    Types: Cigarettes   Smokeless tobacco: Never  Vaping Use   Vaping Use: Never used  Substance Use Topics   Alcohol use: No   Drug use: No     Colonoscopy:  PAP:  Bone density:  Lipid panel:  Allergies  Allergen Reactions   Nausea Control [Emetrol] Nausea And Vomiting   Other     Note: burning, nausea with XR 75 mg   Hydroxyzine Rash and Hives    Current Outpatient Medications  Medication Sig Dispense Refill   acetaminophen (TYLENOL) 650 MG CR tablet Take 1,300 mg by mouth every 8 (eight) hours as needed for pain.     albuterol (VENTOLIN HFA) 108 (90 Base) MCG/ACT inhaler Inhale into the lungs.     ALPRAZolam (XANAX) 0.25 MG tablet Take 0.25 mg by mouth 2 (two) times daily as needed for anxiety.   2   azelastine (ASTELIN) 0.1 % nasal spray Place 1 spray into both nostrils 2 (two) times daily as needed for rhinitis.  4   Budeson-Glycopyrrol-Formoterol (BREZTRI AEROSPHERE) 160-9-4.8 MCG/ACT AERO Inhale 2 puffs into the lungs 2 (two) times daily. 10.7 g 11   clopidogrel (PLAVIX) 75 MG tablet Take 75 mg by mouth daily.      dexlansoprazole (DEXILANT) 60 MG capsule  TAKE 1 CAPSULE EVERY DAY 90 capsule 3   dicyclomine (BENTYL) 10 MG capsule TAKE 1 CAPSULE (10 MG TOTAL) BY MOUTH 2 (TWO) TIMES DAILY BEFORE A MEAL. 180 capsule 3   fexofenadine (ALLEGRA) 60 MG tablet Take 30 mg by mouth 2 (two) times daily as needed (allergies.).     gabapentin (NEURONTIN) 300 MG capsule Take 1 capsule (300 mg total) by mouth 3 (three) times daily. 90 capsule 0   hydrALAZINE (APRESOLINE) 25 MG tablet Take 25 mg by mouth 3 (three) times daily.     isosorbide mononitrate (IMDUR) 30 MG 24 hr tablet Take 30 mg by mouth daily with breakfast.     levothyroxine (SYNTHROID, LEVOTHROID) 88 MCG tablet Take 88 mcg by mouth daily before breakfast.      Magnesium 250 MG TABS Take by mouth.     meclizine (ANTIVERT) 25  MG tablet Take 2 tablets (50 mg total) by mouth 3 (three) times daily as needed for dizziness or nausea. 30 tablet 1   montelukast (SINGULAIR) 10 MG tablet Take 10 mg by mouth at bedtime.     ondansetron (ZOFRAN) 4 MG tablet      pantoprazole (PROTONIX) 40 MG tablet Take by mouth.     predniSONE (DELTASONE) 10 MG tablet Take one tab 3 x day for 3 days, then take one tab 2 x a day for 3 days and then take one tab a day for 3 days for copd 18 tablet 0   promethazine (PHENERGAN) 12.5 MG tablet Take 12.5 mg by mouth every 6 (six) hours as needed for nausea or vomiting.     ranolazine (RANEXA) 1000 MG SR tablet Take 500 mg by mouth 2 (two) times daily.     temazepam (RESTORIL) 30 MG capsule Take 30 mg by mouth at bedtime.     traZODone (DESYREL) 50 MG tablet Take 50 mg by mouth at bedtime.      No current facility-administered medications for this visit.   Facility-Administered Medications Ordered in Other Visits  Medication Dose Route Frequency Provider Last Rate Last Admin   sodium chloride flush (NS) 0.9 % injection 3 mL  3 mL Intravenous Q12H Neoma Laming A, MD        OBJECTIVE: Vitals:   05/05/22 1314  BP: 122/72  Pulse: 73  Resp: 16  Temp: 98.3 F (36.8 C)  SpO2:  98%     Body mass index is 29.05 kg/m.    ECOG FS:0 - Asymptomatic  General: Well-developed, well-nourished, no acute distress. Eyes: Pink conjunctiva, anicteric sclera. HEENT: Normocephalic, moist mucous membranes. Breast: declined Lungs: No audible wheezing or coughing. Heart: Regular rate and rhythm. Abdomen: Soft, nontender, no obvious distention. Musculoskeletal: No edema, cyanosis, or clubbing. Neuro: Alert, answering all questions appropriately. Cranial nerves grossly intact. Skin: No rashes or petechiae noted. Psych: Normal affect.  LAB RESULTS:  Lab Results  Component Value Date   NA 127 (L) 05/05/2022   K 4.3 05/05/2022   CL 94 (L) 05/05/2022   CO2 26 05/05/2022   GLUCOSE 100 (H) 05/05/2022   BUN 15 05/05/2022   CREATININE 1.59 (H) 05/05/2022   CALCIUM 8.6 (L) 05/05/2022   PROT 7.4 05/05/2022   ALBUMIN 3.8 05/05/2022   AST 21 05/05/2022   ALT 15 05/05/2022   ALKPHOS 67 05/05/2022   BILITOT 0.5 05/05/2022   GFRNONAA 34 (L) 05/05/2022   GFRAA 45 (L) 04/02/2020    Lab Results  Component Value Date   WBC 3.3 (L) 05/05/2022   NEUTROABS 5.1 11/02/2016   HGB 10.0 (L) 05/05/2022   HCT 30.4 (L) 05/05/2022   MCV 95.9 05/05/2022   PLT 222 05/05/2022     STUDIES: No results found.  ASSESSMENT: Pathologic stage IIIa triple positive invasive carcinoma of the lower inner quadrant of the right breast.   PLAN:    1. Pathologic stage IIIa triple positive invasive carcinoma of the lower inner quadrant of the right breast:  She only completed 14 of 18 infusions of Herceptin secondary to persistent nausea.  Patient completed over 7 years of letrozole and has now discontinued treatment.  Her CA 27-29 continues to be within normal limits at 28.6.  Her most recent mammogram on January 27, 2021 was reported as BI-RADS 1.  Mammograms now scheduled by PCP. Next due 02/2023.   2. Osteoporosis: Bone mineral density from January 27, 2021 revealed a T  score of -2.9 which is slightly  worse than the year prior.  Patient could not tolerate Fosamax and Prolia was denied by insurance. Repeat bone density in 01/2022 was reported with T score -3.3. Worse. Labs reviewed. Continue zometa. Continue calcium and vitamin D supplementation.  Repeat bone mineral density in July 2024.  Return to clinic in 6 months with repeat laboratory work, further evaluation, and continuation of treatment.   3.  CKD: Chronic. Worsening. eGFR now 34. Followed by Dr. Candiss Norse.   4.  Hyponatremia: Chronic and unchanged.  Patient's sodium is 127.    5. Anemia- hmg 10. Will check additional labs at next visit. Likely anemia of ckd. Getting close to hmg < 10 in which case she would become candidate for EPO injections.   6. Hypocalcemia- Ca 8.6 today. Normal albumin. Recommend calcium 1200 mg daily along with vitamin d 1000 iu daily. May need to increase; she is unsure of dosing. Goal vitamin D > 50. Plan to check vitamin d levels at next visit or with pcp next week.   Disposition:  Zometa today 6 mo- lab (cbc, cmp, ferritin, iron studies, vitamin d, b12, epo, retic panel), Dr Grayland Ormond or APP, +/- zometa  Patient expressed understanding and was in agreement with this plan. She also understands that She can call clinic at any time with any questions, concerns, or complaints.   Breast cancer of lower-inner quadrant of right female breast   Staging form: Breast, AJCC 7th Edition     Pathologic stage from 02/24/2015: Stage IIIA (T1c, N2b, cM0) - Signed by Lloyd Huger, MD on 02/24/2015   Verlon Au, NP 05/05/22   CC: Margurite Auerbach, NP

## 2022-05-06 LAB — CANCER ANTIGEN 27.29: CA 27.29: 23.4 U/mL (ref 0.0–38.6)

## 2022-05-16 DIAGNOSIS — E782 Mixed hyperlipidemia: Secondary | ICD-10-CM | POA: Diagnosis not present

## 2022-05-16 DIAGNOSIS — I10 Essential (primary) hypertension: Secondary | ICD-10-CM | POA: Diagnosis not present

## 2022-05-16 DIAGNOSIS — E039 Hypothyroidism, unspecified: Secondary | ICD-10-CM | POA: Diagnosis not present

## 2022-05-19 DIAGNOSIS — Z23 Encounter for immunization: Secondary | ICD-10-CM | POA: Diagnosis not present

## 2022-05-19 DIAGNOSIS — N189 Chronic kidney disease, unspecified: Secondary | ICD-10-CM | POA: Diagnosis not present

## 2022-05-19 DIAGNOSIS — E782 Mixed hyperlipidemia: Secondary | ICD-10-CM | POA: Diagnosis not present

## 2022-05-19 DIAGNOSIS — E559 Vitamin D deficiency, unspecified: Secondary | ICD-10-CM | POA: Diagnosis not present

## 2022-05-19 DIAGNOSIS — I10 Essential (primary) hypertension: Secondary | ICD-10-CM | POA: Diagnosis not present

## 2022-05-19 DIAGNOSIS — F411 Generalized anxiety disorder: Secondary | ICD-10-CM | POA: Diagnosis not present

## 2022-05-19 DIAGNOSIS — Z0001 Encounter for general adult medical examination with abnormal findings: Secondary | ICD-10-CM | POA: Diagnosis not present

## 2022-05-19 DIAGNOSIS — J069 Acute upper respiratory infection, unspecified: Secondary | ICD-10-CM | POA: Diagnosis not present

## 2022-05-19 DIAGNOSIS — E039 Hypothyroidism, unspecified: Secondary | ICD-10-CM | POA: Diagnosis not present

## 2022-05-22 DIAGNOSIS — I351 Nonrheumatic aortic (valve) insufficiency: Secondary | ICD-10-CM | POA: Diagnosis not present

## 2022-05-22 DIAGNOSIS — E782 Mixed hyperlipidemia: Secondary | ICD-10-CM | POA: Diagnosis not present

## 2022-05-22 DIAGNOSIS — N189 Chronic kidney disease, unspecified: Secondary | ICD-10-CM | POA: Diagnosis not present

## 2022-05-22 DIAGNOSIS — E039 Hypothyroidism, unspecified: Secondary | ICD-10-CM | POA: Diagnosis not present

## 2022-05-22 DIAGNOSIS — I10 Essential (primary) hypertension: Secondary | ICD-10-CM | POA: Diagnosis not present

## 2022-05-22 DIAGNOSIS — R7301 Impaired fasting glucose: Secondary | ICD-10-CM | POA: Diagnosis not present

## 2022-05-30 DIAGNOSIS — I2510 Atherosclerotic heart disease of native coronary artery without angina pectoris: Secondary | ICD-10-CM | POA: Diagnosis not present

## 2022-06-05 DIAGNOSIS — I340 Nonrheumatic mitral (valve) insufficiency: Secondary | ICD-10-CM | POA: Diagnosis not present

## 2022-06-05 DIAGNOSIS — I739 Peripheral vascular disease, unspecified: Secondary | ICD-10-CM | POA: Diagnosis not present

## 2022-06-05 DIAGNOSIS — I2510 Atherosclerotic heart disease of native coronary artery without angina pectoris: Secondary | ICD-10-CM | POA: Diagnosis not present

## 2022-06-05 DIAGNOSIS — E782 Mixed hyperlipidemia: Secondary | ICD-10-CM | POA: Diagnosis not present

## 2022-06-05 DIAGNOSIS — I351 Nonrheumatic aortic (valve) insufficiency: Secondary | ICD-10-CM | POA: Diagnosis not present

## 2022-06-05 DIAGNOSIS — I10 Essential (primary) hypertension: Secondary | ICD-10-CM | POA: Diagnosis not present

## 2022-06-06 DIAGNOSIS — I129 Hypertensive chronic kidney disease with stage 1 through stage 4 chronic kidney disease, or unspecified chronic kidney disease: Secondary | ICD-10-CM | POA: Diagnosis not present

## 2022-06-06 DIAGNOSIS — E871 Hypo-osmolality and hyponatremia: Secondary | ICD-10-CM | POA: Diagnosis not present

## 2022-06-06 DIAGNOSIS — N1832 Chronic kidney disease, stage 3b: Secondary | ICD-10-CM | POA: Diagnosis not present

## 2022-07-15 DIAGNOSIS — E039 Hypothyroidism, unspecified: Secondary | ICD-10-CM | POA: Diagnosis not present

## 2022-07-15 DIAGNOSIS — I10 Essential (primary) hypertension: Secondary | ICD-10-CM | POA: Diagnosis not present

## 2022-07-15 DIAGNOSIS — E782 Mixed hyperlipidemia: Secondary | ICD-10-CM | POA: Diagnosis not present

## 2022-07-25 ENCOUNTER — Ambulatory Visit: Payer: Medicare HMO | Admitting: Internal Medicine

## 2022-08-02 DIAGNOSIS — H2513 Age-related nuclear cataract, bilateral: Secondary | ICD-10-CM | POA: Diagnosis not present

## 2022-08-02 DIAGNOSIS — H1045 Other chronic allergic conjunctivitis: Secondary | ICD-10-CM | POA: Diagnosis not present

## 2022-08-02 DIAGNOSIS — H04123 Dry eye syndrome of bilateral lacrimal glands: Secondary | ICD-10-CM | POA: Diagnosis not present

## 2022-08-02 DIAGNOSIS — Z0100 Encounter for examination of eyes and vision without abnormal findings: Secondary | ICD-10-CM | POA: Diagnosis not present

## 2022-08-07 DIAGNOSIS — N1832 Chronic kidney disease, stage 3b: Secondary | ICD-10-CM | POA: Diagnosis not present

## 2022-08-07 DIAGNOSIS — I129 Hypertensive chronic kidney disease with stage 1 through stage 4 chronic kidney disease, or unspecified chronic kidney disease: Secondary | ICD-10-CM | POA: Diagnosis not present

## 2022-08-07 DIAGNOSIS — D631 Anemia in chronic kidney disease: Secondary | ICD-10-CM | POA: Diagnosis not present

## 2022-08-07 DIAGNOSIS — E871 Hypo-osmolality and hyponatremia: Secondary | ICD-10-CM | POA: Diagnosis not present

## 2022-08-21 ENCOUNTER — Ambulatory Visit (INDEPENDENT_AMBULATORY_CARE_PROVIDER_SITE_OTHER): Payer: Medicare HMO | Admitting: Nurse Practitioner

## 2022-08-21 ENCOUNTER — Encounter: Payer: Self-pay | Admitting: Oncology

## 2022-08-21 ENCOUNTER — Encounter: Payer: Self-pay | Admitting: Nurse Practitioner

## 2022-08-21 VITALS — BP 120/70 | HR 71 | Temp 97.3°F | Ht 66.0 in | Wt 170.0 lb

## 2022-08-21 DIAGNOSIS — R7303 Prediabetes: Secondary | ICD-10-CM | POA: Diagnosis not present

## 2022-08-21 DIAGNOSIS — I10 Essential (primary) hypertension: Secondary | ICD-10-CM | POA: Diagnosis not present

## 2022-08-21 DIAGNOSIS — F419 Anxiety disorder, unspecified: Secondary | ICD-10-CM | POA: Diagnosis not present

## 2022-08-21 DIAGNOSIS — E039 Hypothyroidism, unspecified: Secondary | ICD-10-CM | POA: Diagnosis not present

## 2022-08-21 DIAGNOSIS — E782 Mixed hyperlipidemia: Secondary | ICD-10-CM | POA: Diagnosis not present

## 2022-08-21 NOTE — Patient Instructions (Signed)
1) Cont Vit D 2) Fasting labs tody, will call pt with results 3) Follow up appt in 3 months, 2 weeks, fasting labs prior

## 2022-08-21 NOTE — Progress Notes (Signed)
Patient ID: Abigail Ortega, Sex: female DOB: 1950/09/17, 72 y.o..   MRN: [id number]   Chief Complaint  Patient presents with   Follow-up    Follow Up     54monthfollow up, patient had recent labs at Nephrology, no lipid panel or A1c was done.  She is now taking midodrine 5 mg TID, pt needs vit D refill.       Review of Systems  Constitutional: Negative.   HENT: Negative.    Eyes: Negative.   Respiratory: Negative.    Cardiovascular: Negative.   Gastrointestinal:  Positive for nausea.  Genitourinary:  Positive for frequency.  Musculoskeletal: Negative.   Skin: Negative.   Neurological: Negative.   Endo/Heme/Allergies: Negative.   Psychiatric/Behavioral:  The patient is nervous/anxious.      Physical Exam Constitutional:      Appearance: Normal appearance.  HENT:     Head: Normocephalic.     Nose: Nose normal.     Mouth/Throat:     Mouth: Mucous membranes are moist.  Eyes:     Pupils: Pupils are equal, round, and reactive to light.  Cardiovascular:     Rate and Rhythm: Normal rate and regular rhythm.  Pulmonary:     Breath sounds: Normal breath sounds.  Abdominal:     General: Bowel sounds are normal.     Palpations: Abdomen is soft.  Musculoskeletal:        General: Normal range of motion.     Cervical back: Normal range of motion.  Skin:    General: Skin is warm and dry.  Neurological:     Mental Status: She is alert and oriented to person, place, and time.  Psychiatric:        Mood and Affect: Mood normal.        Behavior: Behavior normal.       Problem List Items Addressed This Visit       Cardiovascular and Mediastinum   Hypertension - Primary   Relevant Medications   midodrine (PROAMATINE) 5 MG tablet     Endocrine   Hypothyroid     Other   Anxiety   Hyperlipidemia, unspecified   Relevant Medications   midodrine (PROAMATINE) 5 MG tablet   Other Visit Diagnoses     Prediabetes             CEvern Bio NP

## 2022-08-22 LAB — CMP14+EGFR
ALT: 13 IU/L (ref 0–32)
AST: 15 IU/L (ref 0–40)
Albumin/Globulin Ratio: 1.8 (ref 1.2–2.2)
Albumin: 4.6 g/dL (ref 3.8–4.8)
Alkaline Phosphatase: 83 IU/L (ref 44–121)
BUN/Creatinine Ratio: 11 — ABNORMAL LOW (ref 12–28)
BUN: 16 mg/dL (ref 8–27)
Bilirubin Total: 0.3 mg/dL (ref 0.0–1.2)
CO2: 22 mmol/L (ref 20–29)
Calcium: 9.4 mg/dL (ref 8.7–10.3)
Chloride: 91 mmol/L — ABNORMAL LOW (ref 96–106)
Creatinine, Ser: 1.48 mg/dL — ABNORMAL HIGH (ref 0.57–1.00)
Globulin, Total: 2.5 g/dL (ref 1.5–4.5)
Glucose: 96 mg/dL (ref 70–99)
Potassium: 4.8 mmol/L (ref 3.5–5.2)
Sodium: 127 mmol/L — ABNORMAL LOW (ref 134–144)
Total Protein: 7.1 g/dL (ref 6.0–8.5)
eGFR: 37 mL/min/{1.73_m2} — ABNORMAL LOW (ref >59)

## 2022-08-22 LAB — HEMOGLOBIN A1C
Est. average glucose Bld gHb Est-mCnc: 103 mg/dL
Hgb A1c MFr Bld: 5.2 % (ref 4.8–5.6)

## 2022-08-22 LAB — LIPID PANEL
Chol/HDL Ratio: 1.9 ratio (ref 0.0–4.4)
Cholesterol, Total: 171 mg/dL (ref 100–199)
HDL: 90 mg/dL (ref >39)
LDL Chol Calc (NIH): 69 mg/dL (ref 0–99)
Triglycerides: 61 mg/dL (ref 0–149)
VLDL Cholesterol Cal: 12 mg/dL (ref 5–40)

## 2022-08-22 LAB — TSH: TSH: 1.56 u[IU]/mL (ref 0.450–4.500)

## 2022-08-25 NOTE — Progress Notes (Signed)
Unable to LM, no VM set up

## 2022-08-28 ENCOUNTER — Other Ambulatory Visit: Payer: Medicare HMO

## 2022-09-06 ENCOUNTER — Other Ambulatory Visit: Payer: Self-pay | Admitting: Nurse Practitioner

## 2022-09-16 ENCOUNTER — Other Ambulatory Visit: Payer: Self-pay | Admitting: Cardiovascular Disease

## 2022-09-16 DIAGNOSIS — E782 Mixed hyperlipidemia: Secondary | ICD-10-CM

## 2022-09-16 DIAGNOSIS — I2510 Atherosclerotic heart disease of native coronary artery without angina pectoris: Secondary | ICD-10-CM

## 2022-09-20 ENCOUNTER — Telehealth: Payer: Self-pay

## 2022-09-20 NOTE — Telephone Encounter (Signed)
General Review Call  [place]   Have there been any documented new, changed, or discontinued medications since last visit?  (Include name, dose, frequency, date): Office Visit: 07/15/22 Perrin Maltese MD. For follow-up. No medication changes. 08/21/22 Elicia Lamp H, NP For hypertension No medication changes.  Consults: 08/07/22 Nephrology Murlean Iba, MD. For follow-up. STARTED midodrine 5 MG tablet Take 1 tablet (5 mg total) by mouth in the morning and 1 tablet (5 mg total) in the evening and 1 tablet (5 mg total) before bedtime. Has there been any documented recent hospitalizations or ED visits since last visit with Clinical Lead?: No Adherence Review Does the [facility] have access to medication refill data?: Yes Adherence rates for STAR metric medications: Atorvastatin 80 mg - 07/10/22 90 DS Adherence rates for medications indicated for disease state being reviewed: Atorvastatin 80 mg - 07/10/22 90 DS Does the patient have >5 day gap between last estimated fill dates for any of the above medications or other medication gaps?: No Disease State Questions Able to connect with the Patient?: Yes Did patient have any problems with their health recently?: No Have you had any admissions or emergency room visits or worsening of your condition(s) since last visit?: No Have you had any visits with new specialists or providers since your last visit?: No Have you had any new health care problem(s) since your last visit?: No Have you run out of any of your medications since you last spoke with your clinical lead?: No Are there any medications you are not taking as prescribed?: No Are you having any issues or side effects with your medications?: No Do you have any other health concerns or questions you want to discuss with your Clinical lead  before your next visit?: No Are there any health concerns that you feel we can do a better job addressing?: No Any falls since last visit?: No Any  increased or uncontrolled pain since last visit?: No Next visit Type:: Phone Visit with:: HC Date:: 02/19/2023 Time:: 4:00 PM Clinical Lead Review Review Adherence gaps identified?: No Drug Therapy Problems identified?: No Assessment: No new needs identified. Follow-up as scheduled  Engagement Notes Jerral Ralph on 09/20/2022 07:22 PM Review - 89mns McLemore, Veronica on 09/18/2022 12:26 PM [facility]: 10 min 09/18/22  [facility]Assessment call time spent: 10 min 09/18/22

## 2022-09-29 ENCOUNTER — Ambulatory Visit (INDEPENDENT_AMBULATORY_CARE_PROVIDER_SITE_OTHER): Payer: Medicare HMO | Admitting: Internal Medicine

## 2022-09-29 ENCOUNTER — Ambulatory Visit
Admission: RE | Admit: 2022-09-29 | Discharge: 2022-09-29 | Disposition: A | Discharge status: Home / Self Care | Payer: Medicare HMO | Source: Ambulatory Visit | Attending: Internal Medicine | Admitting: Internal Medicine

## 2022-09-29 ENCOUNTER — Ambulatory Visit
Admission: RE | Admit: 2022-09-29 | Discharge: 2022-09-29 | Disposition: A | Discharge status: Home / Self Care | Payer: Medicare HMO | Attending: Internal Medicine | Admitting: Internal Medicine

## 2022-09-29 ENCOUNTER — Encounter: Payer: Self-pay | Admitting: Internal Medicine

## 2022-09-29 VITALS — BP 112/62 | HR 80 | Ht 66.0 in | Wt 168.0 lb

## 2022-09-29 DIAGNOSIS — I10 Essential (primary) hypertension: Secondary | ICD-10-CM

## 2022-09-29 DIAGNOSIS — R052 Subacute cough: Secondary | ICD-10-CM | POA: Insufficient documentation

## 2022-09-29 DIAGNOSIS — J209 Acute bronchitis, unspecified: Secondary | ICD-10-CM | POA: Diagnosis not present

## 2022-09-29 DIAGNOSIS — J440 Chronic obstructive pulmonary disease with acute lower respiratory infection: Secondary | ICD-10-CM

## 2022-09-29 DIAGNOSIS — E782 Mixed hyperlipidemia: Secondary | ICD-10-CM

## 2022-09-29 DIAGNOSIS — I249 Acute ischemic heart disease, unspecified: Secondary | ICD-10-CM

## 2022-09-29 DIAGNOSIS — R059 Cough, unspecified: Secondary | ICD-10-CM | POA: Diagnosis not present

## 2022-09-29 MED ORDER — LEVOFLOXACIN 250 MG PO TABS: 250.0000 mg | ORAL_TABLET | Freq: Every day | ORAL | 0 refills | Status: AC

## 2022-09-29 MED ORDER — PREDNISONE 20 MG PO TABS: 40.0000 mg | ORAL_TABLET | Freq: Every day | ORAL | 0 refills | Status: AC

## 2022-09-29 MED ORDER — LEVOFLOXACIN 250 MG PO TABS: 250.0000 mg | ORAL_TABLET | Freq: Every day | ORAL | 0 refills | Status: DC

## 2022-09-29 NOTE — Progress Notes (Signed)
Established Patient Office Visit  Subjective:  Patient ID: Abigail Ortega, female    DOB: 05-Jul-1951  Age: 72 y.o. MRN: [id number]  Chief Complaint  Patient presents with   Follow-up    Chest congestion    Patient comes in with complaints of productive cough.  Her symptoms started about a month ago and progressively got worse.  She was using her inhaler that has been prescribed by her pulmonologist.  But now she is coughing up sputum but does not take and colored.  She complains of mild chills, and low-grade fever.  She did not have any sore throat, sinus pressure, no body aches.  Patient also denies nausea vomiting or diarrhea.    No other concerns at this time.   Past Medical History:  Diagnosis Date   Anxiety    Arthritis    Breast cancer ([place]) 2014   Right breast cancer - chemo, radiation and Mastectomy   CHF (congestive heart failure) ([place]) 2013   Chronic kidney disease    Clotting disorder (HCC)    blood clots   Depression    GERD (gastroesophageal reflux disease)    GI bleed    H/O heart artery stent    Headache    Heart attack ([place]) 2012   coronary stent x2 completed by Dr. Clayborn Bigness.   Heart attack ([place]) Apr 29, 2013   Hemorrhoids    Hypertension    Hypothyroidism    IBS (irritable bowel syndrome)    Malignant neoplasm of lower-inner quadrant of female breast ([place]) January 20, 2013.   invasive mammary cancer, minimal 0.85 cm.  Histologic grade 1.   Personal history of chemotherapy 2014   BREAST CA   Personal history of radiation therapy 2014   BREAST CA   Thyroid disease     Past Surgical History:  Procedure Laterality Date   BOTOX INJECTION N/A 03/14/2017   Procedure: BOTOX INJECTION;  Surgeon: Jules Husbands, MD;  Location: [facility] ORS;  Service: General;  Laterality: N/A;   BOTOX INJECTION N/A 05/09/2018   Procedure: BOTOX INJECTION;  Surgeon: Jules Husbands, MD;  Location: [facility] ORS;  Service: General;  Laterality: N/A;   BREAST BIOPSY Right 2014    positive   BREAST SURGERY Right 02-21-2013   right mastectomy   CARDIAC CATHETERIZATION     COLONOSCOPY W/ BIOPSIES  09/15/2010   Colonoscopy completed by Verdie Shire, M.D. Tubular adenoma of the ascending colon and descending colon reported up to 0.4 cm in diameter. No atypia.   COLONOSCOPY WITH PROPOFOL N/A 12/23/2016   Procedure: COLONOSCOPY WITH PROPOFOL;  Surgeon: Wilford Corner, MD;  Location: [facility] ENDOSCOPY;  Service: Endoscopy;  Laterality: N/A;   ESOPHAGOGASTRODUODENOSCOPY N/A 12/13/2016   Procedure: ESOPHAGOGASTRODUODENOSCOPY (EGD);  Surgeon: [name], MD;  Location: [facility] ENDOSCOPY;  Service: Endoscopy;  Laterality: N/A;   LEFT HEART CATH N/A 04/02/2020   Procedure: Left Heart Cath with Coronary Angiography;  Surgeon: Dionisio David, MD;  Location: [place] CV LAB;  Service: Cardiovascular;  Laterality: N/A;   LEFT HEART CATH AND CORONARY ANGIOGRAPHY Left 08/29/2018   Procedure: LEFT HEART CATH AND CORONARY ANGIOGRAPHY;  Surgeon: Dionisio David, MD;  Location: [place] CV LAB;  Service: Cardiovascular;  Laterality: Left;   MASTECTOMY Right 2014   BREAST CA   PORTA CATH INSERTION     SPHINCTEROTOMY N/A 05/09/2018   Procedure: SPHINCTEROTOMY;  Surgeon: Jules Husbands, MD;  Location: [facility] ORS;  Service: General;  Laterality: N/A;   UPPER GI  ENDOSCOPY  09/15/2010     Completed by Verdie Shire, M.D. for nausea. Normal exam reported.   VASCULAR SURGERY      Social History   Socioeconomic History   Marital status: Divorced    Spouse name: Not on file   Number of children: Not on file   Years of education: Not on file   Highest education level: Not on file  Occupational History   Not on file  Tobacco Use   Smoking status: Former    Years: 20    Types: Cigarettes   Smokeless tobacco: Never  Vaping Use   Vaping Use: Never used  Substance and Sexual Activity   Alcohol use: No   Drug use: No   Sexual activity: Not Currently  Other Topics Concern   Not on file  Social  History Narrative   Not on file   Social Determinants of Health   Financial Resource Strain: Not on file  Food Insecurity: Not on file  Transportation Needs: Not on file  Physical Activity: Not on file  Stress: Not on file  Social Connections: Not on file  Intimate Partner Violence: Not on file    Family History  Problem Relation Age of Onset   Breast cancer Mother 69   Cancer Father        colon    Allergies  Allergen Reactions   Nausea Control [Emetrol] Nausea And Vomiting   Other     Note: burning, nausea with XR 75 mg   Effexor [Venlafaxine] Hives, Itching and Rash   Hydroxyzine Rash and Hives    Review of Systems  Constitutional:  Positive for chills and malaise/fatigue. Negative for diaphoresis.  HENT: Negative.    Eyes: Negative.   Respiratory:  Positive for cough, sputum production, shortness of breath and wheezing.   Cardiovascular: Negative.   Gastrointestinal: Negative.   Musculoskeletal: Negative.   Skin: Negative.   Neurological: Negative.   Endo/Heme/Allergies: Negative.   Psychiatric/Behavioral: Negative.         Objective:   BP 112/62   Pulse 80   Ht 5\' 6"  (1.676 m)   Wt 168 lb (76.2 kg)   SpO2 96%   BMI 27.12 kg/m   Vitals:   09/29/22 1330  BP: 112/62  Pulse: 80  Height: 5\' 6"  (1.676 m)  Weight: 168 lb (76.2 kg)  SpO2: 96%  BMI (Calculated): 27.13    Physical Exam Vitals and nursing note reviewed.  Constitutional:      Appearance: Normal appearance.  HENT:     Right Ear: Tympanic membrane normal.     Left Ear: Tympanic membrane normal.     Nose: Nose normal.     Mouth/Throat:     Mouth: Mucous membranes are moist.     Pharynx: Oropharynx is clear.  Cardiovascular:     Rate and Rhythm: Normal rate and regular rhythm.     Pulses: Normal pulses.     Heart sounds: Normal heart sounds.  Pulmonary:     Effort: Pulmonary effort is normal. No respiratory distress.     Breath sounds: Rhonchi present. No wheezing or rales.   Abdominal:     General: Abdomen is flat. Bowel sounds are normal.     Palpations: Abdomen is soft.  Musculoskeletal:        General: Normal range of motion.     Cervical back: Normal range of motion and neck supple.  Skin:    General: Skin is warm.  Neurological:  General: No focal deficit present.     Mental Status: She is alert and oriented to person, place, and time.  Psychiatric:        Mood and Affect: Mood normal.        Behavior: Behavior normal.      No results found for any visits on 09/29/22.      Assessment & Plan:  Chest x-ray today.  Prescription for prednisone burst and Levaquin to 50 mg/day will be sent as patient has renal impairment.  Samples of Mucinex tablets given to the patient she can take them 1 twice a day for the next few days.  Patient to continue her inhalers at home. Problem List Items Addressed This Visit   None Visit Diagnoses     COPD (chronic obstructive pulmonary disease) with acute bronchitis (HCC)    -  Primary   Relevant Medications   predniSONE (DELTASONE) 20 MG tablet   Subacute cough       Relevant Orders   DG Chest 2 View       Return in about 10 days (around 10/09/2022).   Total time spent: 30 minutes  Perrin Maltese, MD  09/29/2022

## 2022-10-09 ENCOUNTER — Ambulatory Visit: Payer: Medicare HMO | Admitting: Nurse Practitioner

## 2022-10-15 DIAGNOSIS — E782 Mixed hyperlipidemia: Secondary | ICD-10-CM | POA: Diagnosis not present

## 2022-10-15 DIAGNOSIS — E039 Hypothyroidism, unspecified: Secondary | ICD-10-CM | POA: Diagnosis not present

## 2022-10-15 DIAGNOSIS — I10 Essential (primary) hypertension: Secondary | ICD-10-CM | POA: Diagnosis not present

## 2022-10-29 ENCOUNTER — Other Ambulatory Visit: Payer: Self-pay | Admitting: Nurse Practitioner

## 2022-11-06 ENCOUNTER — Other Ambulatory Visit: Payer: Self-pay | Admitting: Nurse Practitioner

## 2022-11-06 DIAGNOSIS — E871 Hypo-osmolality and hyponatremia: Secondary | ICD-10-CM | POA: Diagnosis not present

## 2022-11-06 DIAGNOSIS — I129 Hypertensive chronic kidney disease with stage 1 through stage 4 chronic kidney disease, or unspecified chronic kidney disease: Secondary | ICD-10-CM | POA: Diagnosis not present

## 2022-11-06 DIAGNOSIS — N1832 Chronic kidney disease, stage 3b: Secondary | ICD-10-CM | POA: Diagnosis not present

## 2022-11-06 DIAGNOSIS — I10 Essential (primary) hypertension: Secondary | ICD-10-CM | POA: Diagnosis not present

## 2022-11-06 DIAGNOSIS — D631 Anemia in chronic kidney disease: Secondary | ICD-10-CM | POA: Diagnosis not present

## 2022-11-07 ENCOUNTER — Encounter: Payer: Self-pay | Admitting: Oncology

## 2022-11-07 ENCOUNTER — Inpatient Hospital Stay: Payer: Medicare HMO

## 2022-11-07 ENCOUNTER — Inpatient Hospital Stay: Payer: Medicare HMO | Admitting: Oncology

## 2022-11-07 ENCOUNTER — Inpatient Hospital Stay: Payer: Medicare HMO | Attending: Oncology

## 2022-11-07 VITALS — BP 107/64 | HR 78 | Temp 97.5°F | Resp 16 | Ht 66.0 in | Wt 164.5 lb

## 2022-11-07 DIAGNOSIS — M810 Age-related osteoporosis without current pathological fracture: Secondary | ICD-10-CM

## 2022-11-07 DIAGNOSIS — C50311 Malignant neoplasm of lower-inner quadrant of right female breast: Secondary | ICD-10-CM

## 2022-11-07 DIAGNOSIS — D649 Anemia, unspecified: Secondary | ICD-10-CM | POA: Insufficient documentation

## 2022-11-07 DIAGNOSIS — Z170 Estrogen receptor positive status [ER+]: Secondary | ICD-10-CM | POA: Insufficient documentation

## 2022-11-07 DIAGNOSIS — Z87891 Personal history of nicotine dependence: Secondary | ICD-10-CM | POA: Diagnosis not present

## 2022-11-07 DIAGNOSIS — Z08 Encounter for follow-up examination after completed treatment for malignant neoplasm: Secondary | ICD-10-CM

## 2022-11-07 DIAGNOSIS — D631 Anemia in chronic kidney disease: Secondary | ICD-10-CM

## 2022-11-07 LAB — FERRITIN: Ferritin: 191 ng/mL (ref 11–307)

## 2022-11-07 LAB — COMPREHENSIVE METABOLIC PANEL
ALT: 14 U/L (ref 0–44)
AST: 22 U/L (ref 15–41)
Albumin: 4.1 g/dL (ref 3.5–5.0)
Alkaline Phosphatase: 70 U/L (ref 38–126)
Anion gap: 10 (ref 5–15)
BUN: 16 mg/dL (ref 8–23)
CO2: 21 mmol/L — ABNORMAL LOW (ref 22–32)
Calcium: 9.2 mg/dL (ref 8.9–10.3)
Chloride: 96 mmol/L — ABNORMAL LOW (ref 98–111)
Creatinine, Ser: 1.62 mg/dL — ABNORMAL HIGH (ref 0.44–1.00)
GFR, Estimated: 34 mL/min — ABNORMAL LOW (ref >60)
Glucose, Bld: 159 mg/dL — ABNORMAL HIGH (ref 70–99)
Potassium: 3.8 mmol/L (ref 3.5–5.1)
Sodium: 127 mmol/L — ABNORMAL LOW (ref 135–145)
Total Bilirubin: 0.4 mg/dL (ref 0.3–1.2)
Total Protein: 7.1 g/dL (ref 6.5–8.1)

## 2022-11-07 LAB — RETIC PANEL
Immature Retic Fract: 13.2 % (ref 2.3–15.9)
RBC.: 3.15 MIL/uL — ABNORMAL LOW (ref 3.87–5.11)
Retic Count, Absolute: 114.3 10*3/uL (ref 19.0–186.0)
Retic Ct Pct: 3.6 % — ABNORMAL HIGH (ref 0.4–3.1)
Reticulocyte Hemoglobin: 35.3 pg (ref >27.9)

## 2022-11-07 LAB — CBC WITH DIFFERENTIAL/PLATELET
Abs Immature Granulocytes: 0.05 10*3/uL (ref 0.00–0.07)
Basophils Absolute: 0 10*3/uL (ref 0.0–0.1)
Basophils Relative: 0 %
Eosinophils Absolute: 0 10*3/uL (ref 0.0–0.5)
Eosinophils Relative: 0 %
HCT: 30 % — ABNORMAL LOW (ref 36.0–46.0)
Hemoglobin: 10.2 g/dL — ABNORMAL LOW (ref 12.0–15.0)
Immature Granulocytes: 1 %
Lymphocytes Relative: 8 %
Lymphs Abs: 0.7 10*3/uL (ref 0.7–4.0)
MCH: 32.6 pg (ref 26.0–34.0)
MCHC: 34 g/dL (ref 30.0–36.0)
MCV: 95.8 fL (ref 80.0–100.0)
Monocytes Absolute: 0.4 10*3/uL (ref 0.1–1.0)
Monocytes Relative: 4 %
Neutro Abs: 7.8 10*3/uL — ABNORMAL HIGH (ref 1.7–7.7)
Neutrophils Relative %: 87 %
Platelets: 329 10*3/uL (ref 150–400)
RBC: 3.13 MIL/uL — ABNORMAL LOW (ref 3.87–5.11)
RDW: 12.8 % (ref 11.5–15.5)
WBC: 9.1 10*3/uL (ref 4.0–10.5)
nRBC: 0 % (ref 0.0–0.2)

## 2022-11-07 LAB — IRON AND TIBC
Iron: 32 ug/dL (ref 28–170)
Saturation Ratios: 11 % (ref 10.4–31.8)
TIBC: 284 ug/dL (ref 250–450)
UIBC: 252 ug/dL

## 2022-11-07 MED ORDER — ZOLEDRONIC ACID 4 MG/5ML IV CONC
3.3000 mg | Freq: Once | INTRAVENOUS | Status: AC
  Administered 2022-11-07: 3.3 mg via INTRAVENOUS
  Filled 2022-11-07: qty 4.13

## 2022-11-07 MED ORDER — SODIUM CHLORIDE 0.9 % IV SOLN
Freq: Once | INTRAVENOUS | Status: AC
  Filled 2022-11-07: qty 250

## 2022-11-07 NOTE — Progress Notes (Signed)
[facility]  [phone] [phone]  ID: Abigail Ortega OB: May 18, 1951  MR#: [id number]  AOZ#:[id number]  Patient Care Team: Margaretann Loveless, MD as PCP - General (Internal Medicine) Lemar Livings, Merrily Pew, MD (General Surgery) Meryle Ready, MD (General Surgery)  CHIEF COMPLAINT: Pathologic stage IIIa triple positive invasive carcinoma of the lower inner quadrant of the right breast.   INTERVAL HISTORY: Patient returns to clinic today for routine 23-month evaluation and continuation of Zometa.  She currently feels well and is asymptomatic.  She has no neurologic complaints. She denies any recent fevers or illnesses.  She denies any chest pain, shortness of breath, cough, or hemoptysis.  She denies any vomiting, constipation, or diarrhea. She has no urinary complaints.  Patient offers no further specific complaints today.  REVIEW OF SYSTEMS:   Review of Systems  Constitutional: Negative.  Negative for chills, fever, malaise/fatigue and weight loss.  HENT: Negative.  Negative for tinnitus.   Eyes: Negative.   Respiratory: Negative.  Negative for cough and sputum production.   Cardiovascular: Negative.  Negative for chest pain and leg swelling.  Gastrointestinal: Negative.  Negative for abdominal pain, constipation, diarrhea, nausea and vomiting.  Genitourinary: Negative.  Negative for dysuria and flank pain.  Musculoskeletal: Negative.  Negative for myalgias.  Skin: Negative.  Negative for rash.  Neurological: Negative.  Negative for sensory change, weakness and headaches.  Psychiatric/Behavioral: Negative.  The patient is not nervous/anxious and does not have insomnia.     As per HPI. Otherwise, a complete review of systems is negative.  PAST MEDICAL HISTORY: Past Medical History:  Diagnosis Date   Anxiety    Arthritis    Breast cancer 2014   Right breast cancer - chemo, radiation and Mastectomy   CHF (congestive heart failure) 2013    Chronic kidney disease    Clotting disorder    blood clots   Depression    GERD (gastroesophageal reflux disease)    GI bleed    H/O heart artery stent    Headache    Heart attack 2012   coronary stent x2 completed by Dr. Juliann Pares.   Heart attack Apr 29, 2013   Hemorrhoids    Hypertension    Hypothyroidism    IBS (irritable bowel syndrome)    Malignant neoplasm of lower-inner quadrant of female breast January 20, 2013.   invasive mammary cancer, minimal 0.85 cm.  Histologic grade 1.   Personal history of chemotherapy 2014   BREAST CA   Personal history of radiation therapy 2014   BREAST CA   Thyroid disease     PAST SURGICAL HISTORY: Past Surgical History:  Procedure Laterality Date   BOTOX INJECTION N/A 03/14/2017   Procedure: BOTOX INJECTION;  Surgeon: Leafy Ro, MD;  Location: [facility] ORS;  Service: General;  Laterality: N/A;   BOTOX INJECTION N/A 05/09/2018   Procedure: BOTOX INJECTION;  Surgeon: Leafy Ro, MD;  Location: [facility] ORS;  Service: General;  Laterality: N/A;   BREAST BIOPSY Right 2014   positive   BREAST SURGERY Right 02-21-2013   right mastectomy   CARDIAC CATHETERIZATION     COLONOSCOPY W/ BIOPSIES  09/15/2010   Colonoscopy completed by Lutricia Feil, M.D. Tubular adenoma of the ascending colon and descending colon reported up to 0.4 cm in diameter. No atypia.   COLONOSCOPY WITH PROPOFOL N/A 12/23/2016   Procedure: COLONOSCOPY WITH PROPOFOL;  Surgeon: Charlott Rakes, MD;  Location: [facility] ENDOSCOPY;  Service: Endoscopy;  Laterality: N/A;  ESOPHAGOGASTRODUODENOSCOPY N/A 12/13/2016   Procedure: ESOPHAGOGASTRODUODENOSCOPY (EGD);  Surgeon: Wyline Mood, MD;  Location: [facility] ENDOSCOPY;  Service: Endoscopy;  Laterality: N/A;   LEFT HEART CATH N/A 04/02/2020   Procedure: Left Heart Cath with Coronary Angiography;  Surgeon: Laurier Nancy, MD;  Location: [facility] INVASIVE CV LAB;  Service: Cardiovascular;  Laterality: N/A;   LEFT HEART CATH AND CORONARY ANGIOGRAPHY Left  08/29/2018   Procedure: LEFT HEART CATH AND CORONARY ANGIOGRAPHY;  Surgeon: Laurier Nancy, MD;  Location: [facility] INVASIVE CV LAB;  Service: Cardiovascular;  Laterality: Left;   MASTECTOMY Right 2014   BREAST CA   PORTA CATH INSERTION     SPHINCTEROTOMY N/A 05/09/2018   Procedure: SPHINCTEROTOMY;  Surgeon: Leafy Ro, MD;  Location: [facility] ORS;  Service: General;  Laterality: N/A;   UPPER GI ENDOSCOPY  09/15/2010     Completed by Lutricia Feil, M.D. for nausea. Normal exam reported.   VASCULAR SURGERY      FAMILY HISTORY Family History  Problem Relation Age of Onset   Breast cancer Mother 34   Cancer Father        colon       ADVANCED DIRECTIVES:    HEALTH MAINTENANCE: Social History   Tobacco Use   Smoking status: Former    Years: 20    Types: Cigarettes   Smokeless tobacco: Never  Vaping Use   Vaping Use: Never used  Substance Use Topics   Alcohol use: No   Drug use: No     Colonoscopy:  PAP:  Bone density:  Lipid panel:  Allergies  Allergen Reactions   Nausea Control [Emetrol] Nausea And Vomiting   Other     Note: burning, nausea with XR 75 mg   Effexor [Venlafaxine] Hives, Itching and Rash   Hydroxyzine Rash and Hives    Current Outpatient Medications  Medication Sig Dispense Refill   acetaminophen (TYLENOL) 650 MG CR tablet Take 1,300 mg by mouth every 8 (eight) hours as needed for pain.     atorvastatin (LIPITOR) 80 MG tablet TAKE 1 TABLET EVERY DAY 90 tablet 0   azelastine (ASTELIN) 0.1 % nasal spray Place 1 spray into both nostrils 2 (two) times daily as needed for rhinitis.  4   Budeson-Glycopyrrol-Formoterol (BREZTRI AEROSPHERE) 160-9-4.8 MCG/ACT AERO Inhale 2 puffs into the lungs 2 (two) times daily. 10.7 g 11   clopidogrel (PLAVIX) 75 MG tablet TAKE 1 TABLET EVERY DAY (NEED MD APPOINTMENT) 90 tablet 0   dexlansoprazole (DEXILANT) 60 MG capsule TAKE 1 CAPSULE EVERY DAY 90 capsule 3   dicyclomine (BENTYL) 10 MG capsule TAKE 1 CAPSULE EVERY 8 HOURS FOR  ABDOMINAL PAIN 270 capsule 3   fexofenadine (ALLEGRA) 60 MG tablet Take 30 mg by mouth 2 (two) times daily as needed (allergies.).     gabapentin (NEURONTIN) 300 MG capsule TAKE 1 CAPSULE THREE TIMES DAILY 270 capsule 3   hydrALAZINE (APRESOLINE) 25 MG tablet Take 25 mg by mouth 3 (three) times daily.     hydrocortisone 2.5 % cream APPLY PEA SIZED TO RECTAL REGION EVERY 8 HOURS AS NEEDED FOR RELIEF 20 g 11   isosorbide mononitrate (IMDUR) 120 MG 24 hr tablet TAKE 1 TABLET TWICE DAILY 180 tablet 0   levothyroxine (SYNTHROID) 88 MCG tablet TAKE 1 TABLET EVERY MORNING 1 HOUR PRIOR TO OTHER MEDS OR FOOD ON AN EMPTY STOMACH 90 tablet 3   Magnesium 250 MG TABS Take by mouth.     meclizine (ANTIVERT) 25 MG tablet Take 2 tablets (  50 mg total) by mouth 3 (three) times daily as needed for dizziness or nausea. 30 tablet 1   midodrine (PROAMATINE) 5 MG tablet Take 5 mg by mouth 3 (three) times daily.     montelukast (SINGULAIR) 10 MG tablet Take 10 mg by mouth at bedtime.     ondansetron (ZOFRAN) 4 MG tablet      pantoprazole (PROTONIX) 40 MG tablet Take by mouth.     promethazine (PHENERGAN) 12.5 MG tablet Take 12.5 mg by mouth every 6 (six) hours as needed for nausea or vomiting.     ranolazine (RANEXA) 1000 MG SR tablet Take 500 mg by mouth 2 (two) times daily.     traZODone (DESYREL) 100 MG tablet TAKE 1 TABLET BY MOUTH NIGHTLY AT BEDTIME AS NEEDED FOR SLEEP 90 tablet 3   albuterol (VENTOLIN HFA) 108 (90 Base) MCG/ACT inhaler Inhale into the lungs. (Patient not taking: Reported on 11/07/2022)     ALPRAZolam (XANAX) 0.25 MG tablet Take 0.25 mg by mouth 2 (two) times daily as needed for anxiety.  (Patient not taking: Reported on 11/07/2022)  2   temazepam (RESTORIL) 30 MG capsule Take 30 mg by mouth at bedtime. (Patient not taking: Reported on 11/07/2022)     traZODone (DESYREL) 50 MG tablet Take 50 mg by mouth at bedtime.  (Patient not taking: Reported on 11/07/2022)     No current facility-administered  medications for this visit.   Facility-Administered Medications Ordered in Other Visits  Medication Dose Route Frequency Provider Last Rate Last Admin   sodium chloride flush (NS) 0.9 % injection 3 mL  3 mL Intravenous Q12H Adrian Blackwater A, MD        OBJECTIVE: Vitals:   11/07/22 1359  BP: 107/64  Pulse: 78  Resp: 16  Temp: (!) 97.5 F (36.4 C)  SpO2: 98%     Body mass index is 26.55 kg/m.    ECOG FS:0 - Asymptomatic  General: Well-developed, well-nourished, no acute distress. Eyes: Pink conjunctiva, anicteric sclera. HEENT: Normocephalic, moist mucous membranes. Lungs: No audible wheezing or coughing. Heart: Regular rate and rhythm. Abdomen: Soft, nontender, no obvious distention. Musculoskeletal: No edema, cyanosis, or clubbing. Neuro: Alert, answering all questions appropriately. Cranial nerves grossly intact. Skin: No rashes or petechiae noted. Psych: Normal affect.  LAB RESULTS:  Lab Results  Component Value Date   NA 127 (L) 11/07/2022   K 3.8 11/07/2022   CL 96 (L) 11/07/2022   CO2 21 (L) 11/07/2022   GLUCOSE 159 (H) 11/07/2022   BUN 16 11/07/2022   CREATININE 1.62 (H) 11/07/2022   CALCIUM 9.2 11/07/2022   PROT 7.1 11/07/2022   ALBUMIN 4.1 11/07/2022   AST 22 11/07/2022   ALT 14 11/07/2022   ALKPHOS 70 11/07/2022   BILITOT 0.4 11/07/2022   GFRNONAA 34 (L) 11/07/2022   GFRAA 45 (L) 04/02/2020    Lab Results  Component Value Date   WBC 9.1 11/07/2022   NEUTROABS 7.8 (H) 11/07/2022   HGB 10.2 (L) 11/07/2022   HCT 30.0 (L) 11/07/2022   MCV 95.8 11/07/2022   PLT 329 11/07/2022     STUDIES: No results found.  ASSESSMENT: Pathologic stage IIIa triple positive invasive carcinoma of the lower inner quadrant of the right breast.   PLAN:    Pathologic stage IIIa triple positive invasive carcinoma of the lower inner quadrant of the right breast:  She only completed 14 of 18 infusions of Herceptin secondary to persistent nausea.  Patient completed over  7 years of  letrozole and has now discontinued treatment.  Her most recent mammogram on February 27, 2022 was reported as BI-RADS 1.  Repeat in August 2024.  Return to clinic in 6 months for routine evaluation. Osteoporosis: Repeat bone mineral density on January 30, 2022 reported persistent osteoporosis with a T-score of -3.3. Patient could not tolerate Fosamax and Prolia was denied by insurance.  Proceed with Zometa today.  Continue calcium and vitamin D supplementation.  Repeat bone mineral density in August 2024 along with mammogram as above.  Return to clinic in 6 months with repeat laboratory work, further evaluation, and continuation of treatment.   Renal insufficiency: Chronic and unchanged.  Patient's most recent creatinine is 1.62.  Continue follow-up with nephrology as indicated.   Hyponatremia: Chronic and unchanged.  Patient's sodium level is 127. Anemia: Chronic and unchanged.  Patient's most recent hemoglobin is 10.3.  Iron panel is within normal limits.  Likely related to her underlying renal insufficiency.  Patient expressed understanding and was in agreement with this plan. She also understands that She can call clinic at any time with any questions, concerns, or complaints.   Breast cancer of lower-inner quadrant of right female breast   Staging form: Breast, AJCC 7th Edition     Pathologic stage from 02/24/2015: Stage IIIA (T1c, N2b, cM0) - Signed by Jeralyn Ruths, MD on 02/24/2015   Jeralyn Ruths, MD 11/07/22 3:48 PM

## 2022-11-07 NOTE — Patient Instructions (Signed)

## 2022-11-08 LAB — VITAMIN B12: Vitamin B-12: 224 pg/mL (ref 180–914)

## 2022-11-08 LAB — VITAMIN D 25 HYDROXY (VIT D DEFICIENCY, FRACTURES): Vit D, 25-Hydroxy: 101.62 ng/mL — ABNORMAL HIGH (ref 30–100)

## 2022-11-08 LAB — ERYTHROPOIETIN: Erythropoietin: 7.7 m[IU]/mL (ref 2.6–18.5)

## 2022-11-09 ENCOUNTER — Encounter: Payer: Self-pay | Admitting: Oncology

## 2022-11-10 ENCOUNTER — Telehealth: Payer: Self-pay | Admitting: Nurse Practitioner

## 2022-11-10 NOTE — Telephone Encounter (Signed)
Entered in error

## 2022-11-15 ENCOUNTER — Other Ambulatory Visit: Payer: Self-pay | Admitting: Family

## 2022-11-15 ENCOUNTER — Other Ambulatory Visit: Payer: Medicare HMO

## 2022-11-15 DIAGNOSIS — N1831 Chronic kidney disease, stage 3a: Secondary | ICD-10-CM | POA: Diagnosis not present

## 2022-11-15 DIAGNOSIS — N2581 Secondary hyperparathyroidism of renal origin: Secondary | ICD-10-CM | POA: Diagnosis not present

## 2022-11-15 DIAGNOSIS — F419 Anxiety disorder, unspecified: Secondary | ICD-10-CM

## 2022-11-15 DIAGNOSIS — R7303 Prediabetes: Secondary | ICD-10-CM

## 2022-11-15 DIAGNOSIS — N1832 Chronic kidney disease, stage 3b: Secondary | ICD-10-CM | POA: Diagnosis not present

## 2022-11-15 DIAGNOSIS — I10 Essential (primary) hypertension: Secondary | ICD-10-CM | POA: Diagnosis not present

## 2022-11-15 DIAGNOSIS — I129 Hypertensive chronic kidney disease with stage 1 through stage 4 chronic kidney disease, or unspecified chronic kidney disease: Secondary | ICD-10-CM | POA: Diagnosis not present

## 2022-11-15 DIAGNOSIS — E871 Hypo-osmolality and hyponatremia: Secondary | ICD-10-CM | POA: Diagnosis not present

## 2022-11-15 DIAGNOSIS — E782 Mixed hyperlipidemia: Secondary | ICD-10-CM

## 2022-11-15 DIAGNOSIS — E039 Hypothyroidism, unspecified: Secondary | ICD-10-CM

## 2022-11-15 DIAGNOSIS — D631 Anemia in chronic kidney disease: Secondary | ICD-10-CM | POA: Diagnosis not present

## 2022-11-20 ENCOUNTER — Ambulatory Visit: Payer: Medicare HMO | Admitting: Nurse Practitioner

## 2022-11-22 DIAGNOSIS — N1832 Chronic kidney disease, stage 3b: Secondary | ICD-10-CM | POA: Diagnosis not present

## 2022-11-22 DIAGNOSIS — D631 Anemia in chronic kidney disease: Secondary | ICD-10-CM | POA: Diagnosis not present

## 2022-11-22 DIAGNOSIS — R808 Other proteinuria: Secondary | ICD-10-CM | POA: Diagnosis not present

## 2022-11-22 DIAGNOSIS — N2581 Secondary hyperparathyroidism of renal origin: Secondary | ICD-10-CM | POA: Diagnosis not present

## 2022-11-22 DIAGNOSIS — I129 Hypertensive chronic kidney disease with stage 1 through stage 4 chronic kidney disease, or unspecified chronic kidney disease: Secondary | ICD-10-CM | POA: Diagnosis not present

## 2022-11-22 DIAGNOSIS — E871 Hypo-osmolality and hyponatremia: Secondary | ICD-10-CM | POA: Diagnosis not present

## 2022-11-24 ENCOUNTER — Other Ambulatory Visit: Payer: Medicare HMO

## 2022-11-24 DIAGNOSIS — F419 Anxiety disorder, unspecified: Secondary | ICD-10-CM | POA: Diagnosis not present

## 2022-11-24 DIAGNOSIS — E782 Mixed hyperlipidemia: Secondary | ICD-10-CM | POA: Diagnosis not present

## 2022-11-24 DIAGNOSIS — E039 Hypothyroidism, unspecified: Secondary | ICD-10-CM | POA: Diagnosis not present

## 2022-11-24 DIAGNOSIS — R7303 Prediabetes: Secondary | ICD-10-CM | POA: Diagnosis not present

## 2022-11-25 LAB — CMP14+EGFR
ALT: 12 IU/L (ref 0–32)
AST: 15 IU/L (ref 0–40)
Albumin/Globulin Ratio: 1.7 (ref 1.2–2.2)
Albumin: 4.3 g/dL (ref 3.8–4.8)
Alkaline Phosphatase: 76 IU/L (ref 44–121)
BUN/Creatinine Ratio: 7 — ABNORMAL LOW (ref 12–28)
BUN: 12 mg/dL (ref 8–27)
Bilirubin Total: 0.5 mg/dL (ref 0.0–1.2)
CO2: 21 mmol/L (ref 20–29)
Calcium: 9.3 mg/dL (ref 8.7–10.3)
Chloride: 101 mmol/L (ref 96–106)
Creatinine, Ser: 1.74 mg/dL — ABNORMAL HIGH (ref 0.57–1.00)
Globulin, Total: 2.6 g/dL (ref 1.5–4.5)
Glucose: 88 mg/dL (ref 70–99)
Potassium: 4.3 mmol/L (ref 3.5–5.2)
Sodium: 137 mmol/L (ref 134–144)
Total Protein: 6.9 g/dL (ref 6.0–8.5)
eGFR: 31 mL/min/{1.73_m2} — ABNORMAL LOW (ref >59)

## 2022-11-25 LAB — CBC WITH DIFFERENTIAL
Basophils Absolute: 0 10*3/uL (ref 0.0–0.2)
Basos: 1 %
EOS (ABSOLUTE): 0.1 10*3/uL (ref 0.0–0.4)
Eos: 2 %
Hematocrit: 29.3 % — ABNORMAL LOW (ref 34.0–46.6)
Hemoglobin: 9.6 g/dL — ABNORMAL LOW (ref 11.1–15.9)
Immature Grans (Abs): 0 10*3/uL (ref 0.0–0.1)
Immature Granulocytes: 0 %
Lymphocytes Absolute: 1.2 10*3/uL (ref 0.7–3.1)
Lymphs: 23 %
MCH: 32.3 pg (ref 26.6–33.0)
MCHC: 32.8 g/dL (ref 31.5–35.7)
MCV: 99 fL — ABNORMAL HIGH (ref 79–97)
Monocytes Absolute: 0.3 10*3/uL (ref 0.1–0.9)
Monocytes: 6 %
Neutrophils Absolute: 3.6 10*3/uL (ref 1.4–7.0)
Neutrophils: 68 %
RBC: 2.97 x10E6/uL — ABNORMAL LOW (ref 3.77–5.28)
RDW: 12.7 % (ref 11.7–15.4)
WBC: 5.2 10*3/uL (ref 3.4–10.8)

## 2022-11-25 LAB — LIPID PANEL
Chol/HDL Ratio: 1.8 ratio (ref 0.0–4.4)
Cholesterol, Total: 170 mg/dL (ref 100–199)
HDL: 95 mg/dL (ref >39)
LDL Chol Calc (NIH): 62 mg/dL (ref 0–99)
Triglycerides: 67 mg/dL (ref 0–149)
VLDL Cholesterol Cal: 13 mg/dL (ref 5–40)

## 2022-11-25 LAB — HEMOGLOBIN A1C
Est. average glucose Bld gHb Est-mCnc: 91 mg/dL
Hgb A1c MFr Bld: 4.8 % (ref 4.8–5.6)

## 2022-11-25 LAB — TSH: TSH: 2.16 u[IU]/mL (ref 0.450–4.500)

## 2022-11-28 ENCOUNTER — Ambulatory Visit (INDEPENDENT_AMBULATORY_CARE_PROVIDER_SITE_OTHER): Payer: Medicare HMO | Admitting: Nurse Practitioner

## 2022-11-28 ENCOUNTER — Encounter: Payer: Self-pay | Admitting: Nurse Practitioner

## 2022-11-28 VITALS — BP 128/74 | HR 72 | Ht 66.0 in | Wt 164.2 lb

## 2022-11-28 DIAGNOSIS — F419 Anxiety disorder, unspecified: Secondary | ICD-10-CM | POA: Diagnosis not present

## 2022-11-28 DIAGNOSIS — F32A Depression, unspecified: Secondary | ICD-10-CM

## 2022-11-28 DIAGNOSIS — M2550 Pain in unspecified joint: Secondary | ICD-10-CM | POA: Diagnosis not present

## 2022-11-28 DIAGNOSIS — K649 Unspecified hemorrhoids: Secondary | ICD-10-CM

## 2022-11-28 DIAGNOSIS — M542 Cervicalgia: Secondary | ICD-10-CM | POA: Insufficient documentation

## 2022-11-28 DIAGNOSIS — E782 Mixed hyperlipidemia: Secondary | ICD-10-CM | POA: Diagnosis not present

## 2022-11-28 MED ORDER — HYDROCORTISONE (PERIANAL) 2.5 % EX CREA: 1.0000 | TOPICAL_CREAM | Freq: Two times a day (BID) | CUTANEOUS | 1 refills | Status: DC

## 2022-11-28 MED ORDER — FERROUS SULFATE 325 (65 FE) MG PO TABS: 325.0000 mg | ORAL_TABLET | Freq: Every day | ORAL | 3 refills | Status: AC

## 2022-11-28 NOTE — Patient Instructions (Signed)
1) Hemorrhoid care 2) C spine xray 3) Follow up appt in 4 months, fasting labs prior

## 2022-11-28 NOTE — Progress Notes (Signed)
Established Patient Office Visit  Subjective:  Patient ID: Abigail Ortega, female    DOB: Dec 08, 1950  Age: 72 y.o. MRN: [id number]  Chief Complaint  Patient presents with   Follow-up    3 month follow up, discuss lab results.    3 month follow up and review of recent fasting labs.  LDL is normal.  Continues to see Dr. Thedore Mins for CKD.  eGFR is at 31.  Patient has cervicalgia.  Would like C spine xrays.  Hemorrhoids flare.    No other concerns at this time.   Past Medical History:  Diagnosis Date   Anxiety    Arthritis    Breast cancer (HCC) 2014   Right breast cancer - chemo, radiation and Mastectomy   CHF (congestive heart failure) (HCC) 2013   Chronic kidney disease    Clotting disorder (HCC)    blood clots   Depression    GERD (gastroesophageal reflux disease)    GI bleed    H/O heart artery stent    Headache    Heart attack (HCC) 2012   coronary stent x2 completed by Dr. Juliann Pares.   Heart attack (HCC) Apr 29, 2013   Hemorrhoids    Hypertension    Hypothyroidism    IBS (irritable bowel syndrome)    Malignant neoplasm of lower-inner quadrant of female breast (HCC) January 20, 2013.   invasive mammary cancer, minimal 0.85 cm.  Histologic grade 1.   Personal history of chemotherapy 2014   BREAST CA   Personal history of radiation therapy 2014   BREAST CA   Thyroid disease     Past Surgical History:  Procedure Laterality Date   BOTOX INJECTION N/A 03/14/2017   Procedure: BOTOX INJECTION;  Surgeon: Leafy Ro, MD;  Location: [facility] ORS;  Service: General;  Laterality: N/A;   BOTOX INJECTION N/A 05/09/2018   Procedure: BOTOX INJECTION;  Surgeon: Leafy Ro, MD;  Location: [facility] ORS;  Service: General;  Laterality: N/A;   BREAST BIOPSY Right 2014   positive   BREAST SURGERY Right 02-21-2013   right mastectomy   CARDIAC CATHETERIZATION     COLONOSCOPY W/ BIOPSIES  09/15/2010   Colonoscopy completed by Lutricia Feil, M.D. Tubular adenoma of the ascending colon and  descending colon reported up to 0.4 cm in diameter. No atypia.   COLONOSCOPY WITH PROPOFOL N/A 12/23/2016   Procedure: COLONOSCOPY WITH PROPOFOL;  Surgeon: Charlott Rakes, MD;  Location[facility] ENDOSCOPY;  Service: Endoscopy;  Laterality: N/A;   ESOPHAGOGASTRODUODENOSCOPY N/A 12/13/2016   Procedure: ESOPHAGOGASTRODUODENOSCOPY (EGD);  Surgeon: Wyline Mood, MD;  Location: [facility]COPY;  Service: Endoscopy;  Laterality: N/A;   LEFT HEART CATH N/A 04/02/2020   Procedure: Left Heart Cath with Coronary Angiography;  Surgeon: Laurier Nancy, MD;  Location: [facility] INVASIVE CV LAB;  Service: Cardiovascular;  Laterality: N/A;   LEFT HEART CATH AND CORONARY ANGIOGRAPHY Left 08/29/2018   Procedure: LEFT HEART CATH AND CORONARY ANGIOGRAPHY;  Surgeon: Laurier Nancy, MD;  Location: [facility] INVASIVE CV LAB;  Service: Cardiovascular;  Laterality: Left;   MASTECTOMY Right 2014   BREAST CA   PORTA CATH INSERTION     SPHINCTEROTOMY N/A 05/09/2018   Procedure: SPHINCTEROTOMY;  Surgeon: Leafy Ro, MD;  Location: [facility] ORS;  Service: General;  Laterality: N/A;   UPPER GI ENDOSCOPY  09/15/2010     Completed by Lutricia Feil, M.D. for nausea. Normal exam reported.   VASCULAR SURGERY      Social History   Socioeconomic History  Marital status: Divorced    Spouse name: Not on file   Number of children: Not on file   Years of education: Not on file   Highest education level: Not on file  Occupational History   Not on file  Tobacco Use   Smoking status: Former    Years: 20    Types: Cigarettes   Smokeless tobacco: Never  Vaping Use   Vaping Use: Never used  Substance and Sexual Activity   Alcohol use: No   Drug use: No   Sexual activity: Not Currently  Other Topics Concern   Not on file  Social History Narrative   Not on file   Social Determinants of Health   Financial Resource Strain: Not on file  Food Insecurity: Not on file  Transportation Needs: Not on file  Physical Activity: Not on file   Stress: Not on file  Social Connections: Not on file  Intimate Partner Violence: Not on file    Family History  Problem Relation Age of Onset   Breast cancer Mother 71   Cancer Father        colon    Allergies  Allergen Reactions   Nausea Control [Emetrol] Nausea And Vomiting   Other     Note: burning, nausea with XR 75 mg   Effexor [Venlafaxine] Hives, Itching and Rash   Hydroxyzine Rash and Hives    Review of Systems  Constitutional: Negative.   HENT: Negative.    Eyes: Negative.   Respiratory: Negative.    Cardiovascular: Negative.   Gastrointestinal:  Positive for constipation and heartburn.  Genitourinary: Negative.   Musculoskeletal: Negative.   Skin: Negative.   Neurological: Negative.   Endo/Heme/Allergies: Negative.   Psychiatric/Behavioral:  The patient is nervous/anxious.        Objective:   There were no vitals taken for this visit.  There were no vitals filed for this visit.  Physical Exam Vitals reviewed.  Constitutional:      Appearance: Normal appearance.  HENT:     Head: Normocephalic.     Nose: Nose normal.     Mouth/Throat:     Mouth: Mucous membranes are moist.  Eyes:     Pupils: Pupils are equal, round, and reactive to light.  Cardiovascular:     Rate and Rhythm: Normal rate and regular rhythm.  Pulmonary:     Effort: Pulmonary effort is normal.     Breath sounds: Normal breath sounds.  Abdominal:     General: Bowel sounds are normal.     Palpations: Abdomen is soft.  Musculoskeletal:        General: Normal range of motion.     Cervical back: Normal range of motion and neck supple.  Skin:    General: Skin is warm and dry.  Neurological:     Mental Status: She is alert and oriented to person, place, and time.  Psychiatric:        Mood and Affect: Mood normal.        Behavior: Behavior normal.      No results found for any visits on 11/28/22.  Recent Results (from the past 2160 hour(s))  VITAMIN D 25 Hydroxy (Vit-D  Deficiency, Fractures)     Status: Abnormal   Collection Time: 11/07/22  1:47 PM  Result Value Ref Range   Vit D, 25-Hydroxy 101.62 (H) 30 - 100 ng/mL    Comment: (NOTE) Vitamin D deficiency has been defined by the Institute of Medicine  and an Endocrine Society  practice guideline as a level of serum 25-OH  vitamin D less than 20 ng/mL (1,2). The Endocrine Society went on to  further define vitamin D insufficiency as a level between 21 and 29  ng/mL (2).  1. IOM (Institute of Medicine). 2010. Dietary reference intakes for  calcium and D. [place]: [employer]. 2. Holick MF, Binkley [place], Bischoff-Ferrari HA, et al. Evaluation,  treatment, and prevention of vitamin D deficiency: an Endocrine  Society clinical practice guideline, JCEM. 2011 Jul; 96(7): 1911-30.  Performed at [facility], [address] [place], [place] [address]   Ferritin     Status: None   Collection Time: 11/07/22  1:47 PM  Result Value Ref Range   Ferritin 191 11 - 307 ng/mL    Comment: Performed at [facility], [address] [place], [place] [address]  Iron and TIBC     Status: None   Collection Time: 11/07/22  1:47 PM  Result Value Ref Range   Iron 32 28 - 170 ug/dL   TIBC 098 119 - 147 ug/dL   Saturation Ratios 11 10.4 - 31.8 %   UIBC 252 ug/dL    Comment: Performed at [facility], [address] [place], [place] [address]  Comprehensive metabolic panel     Status: Abnormal   Collection Time: 11/07/22  1:47 PM  Result Value Ref Range   Sodium 127 (L) 135 - 145 mmol/L   Potassium 3.8 3.5 - 5.1 mmol/L   Chloride 96 (L) 98 - 111 mmol/L   CO2 21 (L) 22 - 32 mmol/L   Glucose, Bld 159 (H) 70 - 99 mg/dL    Comment: Glucose reference range applies only to samples taken after fasting for at least 8 hours.   BUN 16 8 - 23 mg/dL   Creatinine, Ser 2.13 (H) 0.44 - 1.00 mg/dL   Calcium 9.2 8.9 - 08.6 mg/dL   Total Protein 7.1 6.5 - 8.1 g/dL   Albumin 4.1 3.5 -  5.0 g/dL   AST 22 15 - 41 U/L   ALT 14 0 - 44 U/L   Alkaline Phosphatase 70 38 - 126 U/L   Total Bilirubin 0.4 0.3 - 1.2 mg/dL   GFR, Estimated 34 (L) >60 mL/min    Comment: (NOTE) Calculated using the CKD-EPI Creatinine Equation (2021)    Anion gap 10 5 - 15    Comment: Performed at [facility], [address] [place], [place] [address]  CBC with Differential/Platelet     Status: Abnormal   Collection Time: 11/07/22  1:47 PM  Result Value Ref Range   WBC 9.1 4.0 - 10.5 K/uL   RBC 3.13 (L) 3.87 - 5.11 MIL/uL   Hemoglobin 10.2 (L) 12.0 - 15.0 g/dL   HCT 96.2 (L) 95.2 - 84.1 %   MCV 95.8 80.0 - 100.0 fL   MCH 32.6 26.0 - 34.0 pg   MCHC 34.0 30.0 - 36.0 g/dL   RDW 32.4 40.1 - 02.7 %   Platelets 329 150 - 400 K/uL   nRBC 0.0 0.0 - 0.2 %   Neutrophils Relative % 87 %   Neutro Abs 7.8 (H) 1.7 - 7.7 K/uL   Lymphocytes Relative 8 %   Lymphs Abs 0.7 0.7 - 4.0 K/uL   Monocytes Relative 4 %   Monocytes Absolute 0.4 0.1 - 1.0 K/uL   Eosinophils Relative 0 %   Eosinophils Absolute 0.0 0.0 - 0.5 K/uL   Basophils Relative  0 %   Basophils Absolute 0.0 0.0 - 0.1 K/uL   Immature Granulocytes 1 %   Abs Immature Granulocytes 0.05 0.00 - 0.07 K/uL    Comment: Performed at [facility], [address] [place], [place] [address]  Retic Panel     Status: Abnormal   Collection Time: 11/07/22  1:47 PM  Result Value Ref Range   Retic Ct Pct 3.6 (H) 0.4 - 3.1 %   RBC. 3.15 (L) 3.87 - 5.11 MIL/uL   Retic Count, Absolute 114.3 19.0 - 186.0 K/uL   Immature Retic Fract 13.2 2.3 - 15.9 %   Reticulocyte Hemoglobin 35.3 >27.9 pg    Comment:        Given the high negative predictive value of a RET-He result > 32 pg iron deficiency is essentially excluded. If this patient is anemic other etiologies should be considered. Performed at [facility], [address] [place], [place] [address]   Vitamin B12     Status: None   Collection Time: 11/07/22  1:47 PM  Result Value Ref  Range   Vitamin B-12 224 180 - 914 pg/mL    Comment: (NOTE) This assay is not validated for testing neonatal or myeloproliferative syndrome specimens for Vitamin B12 levels. Performed at [facility], [address], [place], [place] [address]   Erythropoietin     Status: None   Collection Time: 11/07/22  1:47 PM  Result Value Ref Range   Erythropoietin 7.7 2.6 - 18.5 mIU/mL    Comment: (NOTE) [employer] UniCel DxI 800 Immunoassay System Values obtained with different assay methods or kits cannot be used interchangeably. Results cannot be interpreted as absolute evidence of the presence or absence of malignant disease. Performed At: [facility] [address] [place], [place] [id number] Jolene Schimke MD OZ:[id number]   Lipid panel     Status: None   Collection Time: 11/24/22 10:37 AM  Result Value Ref Range   Cholesterol, Total 170 100 - 199 mg/dL   Triglycerides 67 0 - 149 mg/dL   HDL 95 >62 mg/dL   VLDL Cholesterol Cal 13 5 - 40 mg/dL   LDL Chol Calc (NIH) 62 0 - 99 mg/dL   Chol/HDL Ratio 1.8 0.0 - 4.4 ratio    Comment:                                   T. Chol/HDL Ratio                                             Men  Women                               1/2 Avg.Risk  3.4    3.3                                   Avg.Risk  5.0    4.4                                2X Avg.Risk  9.6    7.1  3X Avg.Risk 23.4   11.0   CBC With Differential     Status: Abnormal   Collection Time: 11/24/22 10:37 AM  Result Value Ref Range   WBC 5.2 3.4 - 10.8 x10E3/uL   RBC 2.97 (L) 3.77 - 5.28 x10E6/uL   Hemoglobin 9.6 (L) 11.1 - 15.9 g/dL   Hematocrit 16.1 (L) 09.6 - 46.6 %   MCV 99 (H) 79 - 97 fL   MCH 32.3 26.6 - 33.0 pg   MCHC 32.8 31.5 - 35.7 g/dL   RDW 04.5 40.9 - 81.1 %   Neutrophils 68 Not Estab. %   Lymphs 23 Not Estab. %   Monocytes 6 Not Estab. %   Eos 2 Not Estab. %   Basos 1 Not Estab. %   Neutrophils Absolute 3.6 1.4 -  7.0 x10E3/uL   Lymphocytes Absolute 1.2 0.7 - 3.1 x10E3/uL   Monocytes Absolute 0.3 0.1 - 0.9 x10E3/uL   EOS (ABSOLUTE) 0.1 0.0 - 0.4 x10E3/uL   Basophils Absolute 0.0 0.0 - 0.2 x10E3/uL   Immature Granulocytes 0 Not Estab. %   Immature Grans (Abs) 0.0 0.0 - 0.1 x10E3/uL  CMP14+EGFR     Status: Abnormal   Collection Time: 11/24/22 10:37 AM  Result Value Ref Range   Glucose 88 70 - 99 mg/dL   BUN 12 8 - 27 mg/dL   Creatinine, Ser 9.14 (H) 0.57 - 1.00 mg/dL   eGFR 31 (L) >78 GN/FAO/1.30   BUN/Creatinine Ratio 7 (L) 12 - 28   Sodium 137 134 - 144 mmol/L   Potassium 4.3 3.5 - 5.2 mmol/L   Chloride 101 96 - 106 mmol/L   CO2 21 20 - 29 mmol/L   Calcium 9.3 8.7 - 10.3 mg/dL   Total Protein 6.9 6.0 - 8.5 g/dL   Albumin 4.3 3.8 - 4.8 g/dL   Globulin, Total 2.6 1.5 - 4.5 g/dL   Albumin/Globulin Ratio 1.7 1.2 - 2.2   Bilirubin Total 0.5 0.0 - 1.2 mg/dL   Alkaline Phosphatase 76 44 - 121 IU/L   AST 15 0 - 40 IU/L   ALT 12 0 - 32 IU/L  Hemoglobin A1c     Status: None   Collection Time: 11/24/22 10:37 AM  Result Value Ref Range   Hgb A1c MFr Bld 4.8 4.8 - 5.6 %    Comment:          Prediabetes: 5.7 - 6.4          Diabetes: >6.4          Glycemic control for adults with diabetes: <7.0    Est. average glucose Bld gHb Est-mCnc 91 mg/dL  TSH     Status: None   Collection Time: 11/24/22 10:37 AM  Result Value Ref Range   TSH 2.160 0.450 - 4.500 uIU/mL      Assessment & Plan:   Problem List Items Addressed This Visit   None   No follow-ups on file.   Total time spent: 35 minutes  Orson Eva, NP  11/28/2022

## 2022-11-29 ENCOUNTER — Other Ambulatory Visit: Payer: Self-pay | Admitting: Nurse Practitioner

## 2022-11-29 ENCOUNTER — Other Ambulatory Visit: Payer: Self-pay | Admitting: Internal Medicine

## 2022-11-29 DIAGNOSIS — J441 Chronic obstructive pulmonary disease with (acute) exacerbation: Secondary | ICD-10-CM

## 2022-11-30 ENCOUNTER — Ambulatory Visit (INDEPENDENT_AMBULATORY_CARE_PROVIDER_SITE_OTHER): Payer: Medicare HMO

## 2022-11-30 ENCOUNTER — Other Ambulatory Visit: Payer: Self-pay | Admitting: Cardiovascular Disease

## 2022-11-30 ENCOUNTER — Telehealth: Payer: Self-pay | Admitting: Internal Medicine

## 2022-11-30 DIAGNOSIS — M542 Cervicalgia: Secondary | ICD-10-CM | POA: Diagnosis not present

## 2022-11-30 DIAGNOSIS — E782 Mixed hyperlipidemia: Secondary | ICD-10-CM

## 2022-11-30 DIAGNOSIS — I2510 Atherosclerotic heart disease of native coronary artery without angina pectoris: Secondary | ICD-10-CM

## 2022-11-30 NOTE — Telephone Encounter (Signed)
This pt need appt for pulmonary

## 2022-11-30 NOTE — Telephone Encounter (Signed)
Lvm to schedule pulm f/u to continue med refills-Toni

## 2022-11-30 NOTE — Progress Notes (Signed)
Please let pt know neck xray shows MILD arthritis, take tylenol and use heat for best results

## 2022-12-01 ENCOUNTER — Telehealth: Payer: Self-pay | Admitting: Internal Medicine

## 2022-12-01 NOTE — Telephone Encounter (Signed)
Left another vm to schedule pulmonary follow up-Toni

## 2022-12-04 ENCOUNTER — Telehealth: Payer: Self-pay | Admitting: Internal Medicine

## 2022-12-04 NOTE — Telephone Encounter (Signed)
Patient called back for x-ray results. Results given to patient.

## 2022-12-14 ENCOUNTER — Encounter: Payer: Self-pay | Admitting: Physician Assistant

## 2022-12-14 ENCOUNTER — Ambulatory Visit (INDEPENDENT_AMBULATORY_CARE_PROVIDER_SITE_OTHER): Payer: Medicare HMO | Admitting: Physician Assistant

## 2022-12-14 VITALS — BP 110/70 | HR 85 | Temp 98.4°F | Resp 16 | Ht 66.0 in | Wt 163.2 lb

## 2022-12-14 DIAGNOSIS — R911 Solitary pulmonary nodule: Secondary | ICD-10-CM | POA: Diagnosis not present

## 2022-12-14 DIAGNOSIS — J441 Chronic obstructive pulmonary disease with (acute) exacerbation: Secondary | ICD-10-CM | POA: Diagnosis not present

## 2022-12-14 MED ORDER — BREZTRI AEROSPHERE 160-9-4.8 MCG/ACT IN AERO
2.0000 | INHALATION_SPRAY | Freq: Two times a day (BID) | RESPIRATORY_TRACT | 3 refills | Status: DC
Start: 2022-12-14 — End: 2023-04-10

## 2022-12-14 NOTE — Progress Notes (Signed)
[facility] [address] [place], [place] [address]  Pulmonary Sleep Medicine   Office Visit Note  Patient Name: Abigail Ortega DOB: 1951-06-25 MRN [id number]  Date of Service: 12/14/2022  Complaints/HPI: Pt is here for routine follow up. Breathing has been doing well with breztri and needs refills today. Does not use any rescue inhaler typically. Due for CT chest ordered last year for annual follow up on lung nodule. Also due for PFT.  ROS  General: (-) fever, (-) chills, (-) night sweats, (-) weakness Skin: (-) rashes, (-) itching,. Eyes: (-) visual changes, (-) redness, (-) itching. Nose and Sinuses: (-) nasal stuffiness or itchiness, (-) postnasal drip, (-) nosebleeds, (-) sinus trouble. Mouth and Throat: (-) sore throat, (-) hoarseness. Neck: (-) swollen glands, (-) enlarged thyroid, (-) neck pain. Respiratory: - cough, (-) bloody sputum, - shortness of breath, - wheezing. Cardiovascular: - ankle swelling, (-) chest pain. Lymphatic: (-) lymph node enlargement. Neurologic: (-) numbness, (-) tingling. Psychiatric: (-) anxiety, (-) depression   Current Medication: Outpatient Encounter Medications as of 12/14/2022  Medication Sig Note   acetaminophen (TYLENOL) 650 MG CR tablet Take 1,300 mg by mouth every 8 (eight) hours as needed for pain. 11/03/2020: Prn    albuterol (VENTOLIN HFA) 108 (90 Base) MCG/ACT inhaler Inhale into the lungs.    ALPRAZolam (XANAX) 0.25 MG tablet Take 0.25 mg by mouth 2 (two) times daily as needed for anxiety.    atorvastatin (LIPITOR) 80 MG tablet TAKE 1 TABLET EVERY DAY    azelastine (ASTELIN) 0.1 % nasal spray Place 1 spray into both nostrils 2 (two) times daily as needed for rhinitis.    clopidogrel (PLAVIX) 75 MG tablet TAKE 1 TABLET EVERY DAY (NEED MD APPOINTMENT)    dexlansoprazole (DEXILANT) 60 MG capsule TAKE 1 CAPSULE EVERY DAY    dicyclomine (BENTYL) 10 MG capsule TAKE 1 CAPSULE EVERY 8 HOURS FOR ABDOMINAL PAIN    ferrous  sulfate 325 (65 FE) MG tablet Take 1 tablet (325 mg total) by mouth daily.    fexofenadine (ALLEGRA) 60 MG tablet Take 30 mg by mouth 2 (two) times daily as needed (allergies.). 11/03/2020: prn   fluticasone (FLONASE) 50 MCG/ACT nasal spray BEND HEAD FORWARD AND USE 1 SPRAY IN EACH NOSTRIL EVERY DAY    furosemide (LASIX) 20 MG tablet TAKE 1 TABLET EVERY MORNING.    gabapentin (NEURONTIN) 300 MG capsule TAKE 1 CAPSULE THREE TIMES DAILY    hydrALAZINE (APRESOLINE) 25 MG tablet Take 25 mg by mouth 3 (three) times daily.    hydrocortisone (ANUSOL-HC) 2.5 % rectal cream Place 1 Application rectally 2 (two) times daily.    hydrocortisone 2.5 % cream APPLY PEA SIZED TO RECTAL REGION EVERY 8 HOURS AS NEEDED FOR RELIEF    isosorbide mononitrate (IMDUR) 120 MG 24 hr tablet TAKE 1 TABLET TWICE DAILY    levothyroxine (SYNTHROID) 88 MCG tablet TAKE 1 TABLET EVERY MORNING 1 HOUR PRIOR TO OTHER MEDS OR FOOD ON AN EMPTY STOMACH    Magnesium 250 MG TABS Take by mouth.    meclizine (ANTIVERT) 25 MG tablet Take 2 tablets (50 mg total) by mouth 3 (three) times daily as needed for dizziness or nausea. 11/03/2020: prn   midodrine (PROAMATINE) 5 MG tablet Take 5 mg by mouth 3 (three) times daily.    montelukast (SINGULAIR) 10 MG tablet Take 10 mg by mouth at bedtime.    ondansetron (ZOFRAN) 4 MG tablet     pantoprazole (PROTONIX) 40 MG tablet Take by  mouth.    promethazine (PHENERGAN) 12.5 MG tablet Take 12.5 mg by mouth every 6 (six) hours as needed for nausea or vomiting.    ranolazine (RANEXA) 1000 MG SR tablet Take 500 mg by mouth 2 (two) times daily.    temazepam (RESTORIL) 30 MG capsule Take 30 mg by mouth at bedtime.    traZODone (DESYREL) 100 MG tablet TAKE 1 TABLET BY MOUTH NIGHTLY AT BEDTIME AS NEEDED FOR SLEEP    traZODone (DESYREL) 50 MG tablet Take 50 mg by mouth at bedtime.    [DISCONTINUED] Budeson-Glycopyrrol-Formoterol (BREZTRI AEROSPHERE) 160-9-4.8 MCG/ACT AERO INHALE 2 PUFFS INTO THE LUNGS 2 (TWO)  TIMES DAILY.    Budeson-Glycopyrrol-Formoterol (BREZTRI AEROSPHERE) 160-9-4.8 MCG/ACT AERO Inhale 2 puffs into the lungs 2 (two) times daily.    Facility-Administered Encounter Medications as of 12/14/2022  Medication   sodium chloride flush (NS) 0.9 % injection 3 mL    Surgical History: Past Surgical History:  Procedure Laterality Date   BOTOX INJECTION N/A 03/14/2017   Procedure: BOTOX INJECTION;  Surgeon: Leafy Ro, MD;  Location: [facility] ORS;  Service: General;  Laterality: N/A;   BOTOX INJECTION N/A 05/09/2018   Procedure: BOTOX INJECTION;  Surgeon: Leafy Ro, MD;  Location: [facility] ORS;  Service: General;  Laterality: N/A;   BREAST BIOPSY Right 2014   positive   BREAST SURGERY Right 02-21-2013   right mastectomy   CARDIAC CATHETERIZATION     COLONOSCOPY W/ BIOPSIES  09/15/2010   Colonoscopy completed by Lutricia Feil, M.D. Tubular adenoma of the ascending colon and descending colon reported up to 0.4 cm in diameter. No atypia.   COLONOSCOPY WITH PROPOFOL N/A 12/23/2016   Procedure: COLONOSCOPY WITH PROPOFOL;  Surgeon: Charlott Rakes, MD;  Location[facility] ENDOSCOPY;  Service: Endoscopy;  Laterality: N/A;   ESOPHAGOGASTRODUODENOSCOPY N/A 12/13/2016   Procedure: ESOPHAGOGASTRODUODENOSCOPY (EGD);  Surgeon: Wyline Mood, MD;  Location: [facility] ENDOSCOPY;  Service: Endoscopy;  Laterality: N/A;   LEFT HEART CATH N/A 04/02/2020   Procedure: Left Heart Cath with Coronary Angiography;  Surgeon: Laurier Nancy, MD;  Location: [facility] INVASIVE CV LAB;  Service: Cardiovascular;  Laterality: N/A;   LEFT HEART CATH AND CORONARY ANGIOGRAPHY Left 08/29/2018   Procedure: LEFT HEART CATH AND CORONARY ANGIOGRAPHY;  Surgeon: Laurier Nancy, MD;  Location: [facility] INVASIVE CV LAB;  Service: Cardiovascular;  Laterality: Left;   MASTECTOMY Right 2014   BREAST CA   PORTA CATH INSERTION     SPHINCTEROTOMY N/A 05/09/2018   Procedure: SPHINCTEROTOMY;  Surgeon: Leafy Ro, MD;  Location: [facility] ORS;  Service: General;   Laterality: N/A;   UPPER GI ENDOSCOPY  09/15/2010     Completed by Lutricia Feil, M.D. for nausea. Normal exam reported.   VASCULAR SURGERY      Medical History: Past Medical History:  Diagnosis Date   Anxiety    Arthritis    Breast cancer (HCC) 2014   Right breast cancer - chemo, radiation and Mastectomy   CHF (congestive heart failure) (HCC) 2013   Chronic kidney disease    Clotting disorder (HCC)    blood clots   Depression    GERD (gastroesophageal reflux disease)    GI bleed    H/O heart artery stent    Headache    Heart attack (HCC) 2012   coronary stent x2 completed by Dr. Juliann Pares.   Heart attack (HCC) Apr 29, 2013   Hemorrhoids    Hypertension    Hypothyroidism    IBS (irritable bowel syndrome)  Malignant neoplasm of lower-inner quadrant of female breast [facility]) January 20, 2013.   invasive mammary cancer, minimal 0.85 cm.  Histologic grade 1.   Personal history of chemotherapy 2014   BREAST CA   Personal history of radiation therapy 2014   BREAST CA   Thyroid disease     Family History: Family History  Problem Relation Age of Onset   Breast cancer Mother 51   Cancer Father        colon    Social History: Social History   Socioeconomic History   Marital status: Divorced    Spouse name: Not on file   Number of children: Not on file   Years of education: Not on file   Highest education level: Not on file  Occupational History   Not on file  Tobacco Use   Smoking status: Former    Years: 20    Types: Cigarettes   Smokeless tobacco: Never  Vaping Use   Vaping Use: Never used  Substance and Sexual Activity   Alcohol use: No   Drug use: No   Sexual activity: Not Currently  Other Topics Concern   Not on file  Social History Narrative   Not on file   Social Determinants of Health   Financial Resource Strain: Not on file  Food Insecurity: Not on file  Transportation Needs: Not on file  Physical Activity: Not on file  Stress: Not on file  Social  Connections: Not on file  Intimate Partner Violence: Not on file    Vital Signs: Blood pressure 110/70, pulse 85, temperature 98.4 F (36.9 C), resp. rate 16, height 5\' 6"  (1.676 m), weight 163 lb 3.2 oz (74 kg), SpO2 98 %.  Examination: General Appearance: The patient is well-developed, well-nourished, and in no distress. Skin: Gross inspection of skin unremarkable. Head: normocephalic, no gross deformities. Eyes: no gross deformities noted. ENT: ears appear grossly normal no exudates. Neck: Supple. No thyromegaly. No LAD. Respiratory: Lungs clear to auscultation bilaterally. Cardiovascular: Normal S1 and S2 without murmur or rub. Extremities: No cyanosis. pulses are equal. Neurologic: Alert and oriented. No involuntary movements.  LABS: Recent Results (from the past 2160 hour(s))  VITAMIN D 25 Hydroxy (Vit-D Deficiency, Fractures)     Status: Abnormal   Collection Time: 11/07/22  1:47 PM  Result Value Ref Range   Vit D, 25-Hydroxy 101.62 (H) 30 - 100 ng/mL    Comment: (NOTE) Vitamin D deficiency has been defined by the Institute of Medicine  and an Endocrine Society practice guideline as a level of serum 25-OH  vitamin D less than 20 ng/mL (1,2). The Endocrine Society went on to  further define vitamin D insufficiency as a level between 21 and 29  ng/mL (2).  1. IOM (Institute of Medicine). 2010. Dietary reference intakes for  calcium and D. [place]: [employer]. 2. Holick MF, Binkley [place], Bischoff-Ferrari HA, et al. Evaluation,  treatment, and prevention of vitamin D deficiency: an Endocrine  Society clinical practice guideline, JCEM. 2011 Jul; 96(7): 1911-30.  Performed at [facility], [address], [place], [place] [address]   Ferritin     Status: None   Collection Time: 11/07/22  1:47 PM  Result Value Ref Range   Ferritin 191 11 - 307 ng/mL    Comment: Performed at [facility], [address] [place], [place] [address]   Iron and TIBC     Status: None   Collection Time: 11/07/22  1:47 PM  Result Value Ref Range   Iron 32 28 - 170 ug/dL   TIBC 161 096 - 045 ug/dL   Saturation Ratios 11 10.4 - 31.8 %   UIBC 252 ug/dL    Comment: Performed at [facility], [address] [place], [place] [address]  Comprehensive metabolic panel     Status: Abnormal   Collection Time: 11/07/22  1:47 PM  Result Value Ref Range   Sodium 127 (L) 135 - 145 mmol/L   Potassium 3.8 3.5 - 5.1 mmol/L   Chloride 96 (L) 98 - 111 mmol/L   CO2 21 (L) 22 - 32 mmol/L   Glucose, Bld 159 (H) 70 - 99 mg/dL    Comment: Glucose reference range applies only to samples taken after fasting for at least 8 hours.   BUN 16 8 - 23 mg/dL   Creatinine, Ser 1.91 (H) 0.44 - 1.00 mg/dL   Calcium 9.2 8.9 - 47.8 mg/dL   Total Protein 7.1 6.5 - 8.1 g/dL   Albumin 4.1 3.5 - 5.0 g/dL   AST 22 15 - 41 U/L   ALT 14 0 - 44 U/L   Alkaline Phosphatase 70 38 - 126 U/L   Total Bilirubin 0.4 0.3 - 1.2 mg/dL   GFR, Estimated 34 (L) >60 mL/min    Comment: (NOTE) Calculated using the CKD-EPI Creatinine Equation (2021)    Anion gap 10 5 - 15    Comment: Performed at [facility], [address] [place], [place] [address]  CBC with Differential/Platelet     Status: Abnormal   Collection Time: 11/07/22  1:47 PM  Result Value Ref Range   WBC 9.1 4.0 - 10.5 K/uL   RBC 3.13 (L) 3.87 - 5.11 MIL/uL   Hemoglobin 10.2 (L) 12.0 - 15.0 g/dL   HCT 13.0 (L) 86.5 - 78.4 %   MCV 95.8 80.0 - 100.0 fL   MCH 32.6 26.0 - 34.0 pg   MCHC 34.0 30.0 - 36.0 g/dL   RDW 69.6 29.5 - 28.4 %   Platelets 329 150 - 400 K/uL   nRBC 0.0 0.0 - 0.2 %   Neutrophils Relative % 87 %   Neutro Abs 7.8 (H) 1.7 - 7.7 K/uL   Lymphocytes Relative 8 %   Lymphs Abs 0.7 0.7 - 4.0 K/uL   Monocytes Relative 4 %   Monocytes Absolute 0.4 0.1 - 1.0 K/uL   Eosinophils Relative 0 %   Eosinophils Absolute 0.0 0.0 - 0.5 K/uL   Basophils Relative 0 %   Basophils Absolute 0.0 0.0 -  0.1 K/uL   Immature Granulocytes 1 %   Abs Immature Granulocytes 0.05 0.00 - 0.07 K/uL    Comment: Performed at [facility], [address] [place], [place] [address]  Retic Panel     Status: Abnormal   Collection Time: 11/07/22  1:47 PM  Result Value Ref Range   Retic Ct Pct 3.6 (H) 0.4 - 3.1 %   RBC. 3.15 (L) 3.87 - 5.11 MIL/uL   Retic Count, Absolute 114.3 19.0 - 186.0 K/uL   Immature Retic Fract 13.2 2.3 - 15.9 %   Reticulocyte Hemoglobin 35.3 >27.9 pg    Comment:        Given the high negative predictive value of a RET-He result > 32 pg iron deficiency is essentially excluded. If this patient is anemic other etiologies should be considered. Performed at [facility], [address] [place], [place] [address]   Vitamin B12  Status: None   Collection Time: 11/07/22  1:47 PM  Result Value Ref Range   Vitamin B-12 224 180 - 914 pg/mL    Comment: (NOTE) This assay is not validated for testing neonatal or myeloproliferative syndrome specimens for Vitamin B12 levels. Performed at [facility], [address], [place], [place] [address]   Erythropoietin     Status: None   Collection Time: 11/07/22  1:47 PM  Result Value Ref Range   Erythropoietin 7.7 2.6 - 18.5 mIU/mL    Comment: (NOTE) [employer] UniCel DxI 800 Immunoassay System Values obtained with different assay methods or kits cannot be used interchangeably. Results cannot be interpreted as absolute evidence of the presence or absence of malignant disease. Performed At: [facility] [address] [place], [place] [id number] Jolene Schimke MD LK:[id number]   Lipid panel     Status: None   Collection Time: 11/24/22 10:37 AM  Result Value Ref Range   Cholesterol, Total 170 100 - 199 mg/dL   Triglycerides 67 0 - 149 mg/dL   HDL 95 >66 mg/dL   VLDL Cholesterol Cal 13 5 - 40 mg/dL   LDL Chol Calc (NIH) 62 0 - 99 mg/dL   Chol/HDL Ratio 1.8 0.0 - 4.4 ratio    Comment:                                    T. Chol/HDL Ratio                                             Men  Women                               1/2 Avg.Risk  3.4    3.3                                   Avg.Risk  5.0    4.4                                2X Avg.Risk  9.6    7.1                                3X Avg.Risk 23.4   11.0   CBC With Differential     Status: Abnormal   Collection Time: 11/24/22 10:37 AM  Result Value Ref Range   WBC 5.2 3.4 - 10.8 x10E3/uL   RBC 2.97 (L) 3.77 - 5.28 x10E6/uL   Hemoglobin 9.6 (L) 11.1 - 15.9 g/dL   Hematocrit 44.0 (L) 34.7 - 46.6 %   MCV 99 (H) 79 - 97 fL   MCH 32.3 26.6 - 33.0 pg   MCHC 32.8 31.5 - 35.7 g/dL   RDW 42.5 95.6 - 38.7 %   Neutrophils 68 Not Estab. %   Lymphs 23 Not Estab. %   Monocytes 6 Not Estab. %   Eos 2 Not Estab. %   Basos 1 Not Estab. %   Neutrophils Absolute 3.6 1.4 - 7.0 x10E3/uL   Lymphocytes Absolute  1.2 0.7 - 3.1 x10E3/uL   Monocytes Absolute 0.3 0.1 - 0.9 x10E3/uL   EOS (ABSOLUTE) 0.1 0.0 - 0.4 x10E3/uL   Basophils Absolute 0.0 0.0 - 0.2 x10E3/uL   Immature Granulocytes 0 Not Estab. %   Immature Grans (Abs) 0.0 0.0 - 0.1 x10E3/uL  CMP14+EGFR     Status: Abnormal   Collection Time: 11/24/22 10:37 AM  Result Value Ref Range   Glucose 88 70 - 99 mg/dL   BUN 12 8 - 27 mg/dL   Creatinine, Ser 1.61 (H) 0.57 - 1.00 mg/dL   eGFR 31 (L) >09 UE/AVW/0.98   BUN/Creatinine Ratio 7 (L) 12 - 28   Sodium 137 134 - 144 mmol/L   Potassium 4.3 3.5 - 5.2 mmol/L   Chloride 101 96 - 106 mmol/L   CO2 21 20 - 29 mmol/L   Calcium 9.3 8.7 - 10.3 mg/dL   Total Protein 6.9 6.0 - 8.5 g/dL   Albumin 4.3 3.8 - 4.8 g/dL   Globulin, Total 2.6 1.5 - 4.5 g/dL   Albumin/Globulin Ratio 1.7 1.2 - 2.2   Bilirubin Total 0.5 0.0 - 1.2 mg/dL   Alkaline Phosphatase 76 44 - 121 IU/L   AST 15 0 - 40 IU/L   ALT 12 0 - 32 IU/L  Hemoglobin A1c     Status: None   Collection Time: 11/24/22 10:37 AM  Result Value Ref Range   Hgb A1c MFr Bld 4.8 4.8 - 5.6 %     Comment:          Prediabetes: 5.7 - 6.4          Diabetes: >6.4          Glycemic control for adults with diabetes: <7.0    Est. average glucose Bld gHb Est-mCnc 91 mg/dL  TSH     Status: None   Collection Time: 11/24/22 10:37 AM  Result Value Ref Range   TSH 2.160 0.450 - 4.500 uIU/mL    Radiology: DG Chest 2 View  Result Date: 09/29/2022 CLINICAL DATA:  Productive cough for the past 2 weeks. EXAM: CHEST - 2 VIEW COMPARISON:  CT chest dated January 16, 2022. Chest x-ray dated February 23, 2014. FINDINGS: The heart size and mediastinal contours are within normal limits. Both lungs are clear. The visualized skeletal structures are unremarkable. IMPRESSION: No active cardiopulmonary disease. Electronically Signed   By: Obie Dredge M.D.   On: 09/29/2022 14:41    No results found.  No results found.    Assessment and Plan: Patient Active Problem List   Diagnosis Date Noted   Cervicalgia 11/28/2022   Hemorrhoids 11/28/2022   Subacute cough 09/29/2022   COPD (chronic obstructive pulmonary disease) with acute bronchitis (HCC) 09/29/2022   Osteoporosis 10/25/2021   Coronary syndrome, acute (HCC) 08/23/2018   Anal fissure    Personal history of colonic polyps 12/23/2016   Hyponatremia 12/21/2016   GIB (gastrointestinal bleeding) 12/12/2016   Depressive disorder 01/14/2016   Headache 01/14/2016   Joint pain 01/14/2016   Myocardial infarction (HCC) 01/14/2016   Obesity (BMI 35.0-39.9 without comorbidity) 08/20/2015   Anal pain 01/13/2015   Anxiety 12/16/2013   Breast cancer (HCC) 12/16/2013   CAD (coronary artery disease) 12/16/2013   Hyperlipidemia, unspecified 12/16/2013   Hypertension 12/16/2013   Hypothyroid 12/16/2013   Nausea 12/16/2013   Acquired absence of breast 03/04/2013   Congestive heart failure (HCC) 03/04/2013   Breast cancer of lower-inner quadrant of right female breast (HCC) 01/28/2013    1. Chronic  obstructive pulmonary disease with acute exacerbation  (HCC) Continue breztri, will update PFT - Budeson-Glycopyrrol-Formoterol (BREZTRI AEROSPHERE) 160-9-4.8 MCG/ACT AERO; Inhale 2 puffs into the lungs 2 (two) times daily.  Dispense: 3 each; Refill: 3 - Pulmonary Function Test; Future  2. Solitary pulmonary nodule on lung CT Will schedule annual CT as previously ordered for monitoring   General Counseling: I have discussed the findings of the evaluation and examination with Elease Hashimoto.  I have also discussed any further diagnostic evaluation thatmay be needed or ordered today. Simrun verbalizes understanding of the findings of todays visit. We also reviewed her medications today and discussed drug interactions and side effects including but not limited excessive drowsiness and altered mental states. We also discussed that there is always a risk not just to her but also people around her. she has been encouraged to call the office with any questions or concerns that should arise related to todays visit.  Orders Placed This Encounter  Procedures   Pulmonary Function Test    Standing Status:   Future    Standing Expiration Date:   12/14/2023    Order Specific Question:   Where should this test be performed?    Answer:   [facility]    Order Specific Question:   Full PFT: includes the following: basic spirometry, spirometry pre & post bronchodilator, diffusion capacity (DLCO), lung volumes    Answer:   Full PFT     Time spent: 30  I have personally obtained a history, examined the patient, evaluated laboratory and imaging results, formulated the assessment and plan and placed orders. This patient was seen by Lynn Ito, PA-C in collaboration with Dr. Freda Munro as a part of collaborative care agreement.     Yevonne Pax, MD [facility] Pulmonary and Critical Care Sleep medicine

## 2022-12-15 ENCOUNTER — Telehealth: Payer: Self-pay | Admitting: Internal Medicine

## 2022-12-15 NOTE — Telephone Encounter (Signed)
Notified patient of CT appointment date, arrival time, location-Abigail Ortega

## 2022-12-18 DIAGNOSIS — N1832 Chronic kidney disease, stage 3b: Secondary | ICD-10-CM | POA: Diagnosis not present

## 2022-12-18 DIAGNOSIS — E871 Hypo-osmolality and hyponatremia: Secondary | ICD-10-CM | POA: Diagnosis not present

## 2022-12-18 DIAGNOSIS — N2581 Secondary hyperparathyroidism of renal origin: Secondary | ICD-10-CM | POA: Diagnosis not present

## 2022-12-18 DIAGNOSIS — D631 Anemia in chronic kidney disease: Secondary | ICD-10-CM | POA: Diagnosis not present

## 2022-12-18 DIAGNOSIS — I129 Hypertensive chronic kidney disease with stage 1 through stage 4 chronic kidney disease, or unspecified chronic kidney disease: Secondary | ICD-10-CM | POA: Diagnosis not present

## 2022-12-20 DIAGNOSIS — D631 Anemia in chronic kidney disease: Secondary | ICD-10-CM | POA: Diagnosis not present

## 2022-12-20 DIAGNOSIS — N1832 Chronic kidney disease, stage 3b: Secondary | ICD-10-CM | POA: Diagnosis not present

## 2022-12-20 DIAGNOSIS — I129 Hypertensive chronic kidney disease with stage 1 through stage 4 chronic kidney disease, or unspecified chronic kidney disease: Secondary | ICD-10-CM | POA: Diagnosis not present

## 2022-12-20 DIAGNOSIS — E871 Hypo-osmolality and hyponatremia: Secondary | ICD-10-CM | POA: Diagnosis not present

## 2022-12-20 DIAGNOSIS — N2581 Secondary hyperparathyroidism of renal origin: Secondary | ICD-10-CM | POA: Diagnosis not present

## 2022-12-27 ENCOUNTER — Ambulatory Visit
Admission: RE | Admit: 2022-12-27 | Discharge: 2022-12-27 | Disposition: A | Discharge status: Home / Self Care | Payer: Medicare HMO | Source: Ambulatory Visit | Attending: Internal Medicine | Admitting: Internal Medicine

## 2022-12-27 DIAGNOSIS — R911 Solitary pulmonary nodule: Secondary | ICD-10-CM | POA: Diagnosis not present

## 2022-12-27 DIAGNOSIS — J439 Emphysema, unspecified: Secondary | ICD-10-CM | POA: Diagnosis not present

## 2022-12-27 DIAGNOSIS — J479 Bronchiectasis, uncomplicated: Secondary | ICD-10-CM | POA: Diagnosis not present

## 2023-01-17 ENCOUNTER — Other Ambulatory Visit: Payer: Self-pay | Admitting: Nurse Practitioner

## 2023-01-17 ENCOUNTER — Other Ambulatory Visit: Payer: Self-pay | Admitting: Cardiovascular Disease

## 2023-01-30 ENCOUNTER — Encounter: Payer: Self-pay | Admitting: Internal Medicine

## 2023-01-30 ENCOUNTER — Ambulatory Visit (INDEPENDENT_AMBULATORY_CARE_PROVIDER_SITE_OTHER): Payer: Medicare HMO | Admitting: Internal Medicine

## 2023-01-30 VITALS — BP 120/76 | HR 78 | Ht 66.0 in | Wt 156.6 lb

## 2023-01-30 DIAGNOSIS — J440 Chronic obstructive pulmonary disease with acute lower respiratory infection: Secondary | ICD-10-CM

## 2023-01-30 DIAGNOSIS — R1013 Epigastric pain: Secondary | ICD-10-CM | POA: Diagnosis not present

## 2023-01-30 DIAGNOSIS — K219 Gastro-esophageal reflux disease without esophagitis: Secondary | ICD-10-CM

## 2023-01-30 DIAGNOSIS — R7303 Prediabetes: Secondary | ICD-10-CM | POA: Diagnosis not present

## 2023-01-30 DIAGNOSIS — K588 Other irritable bowel syndrome: Secondary | ICD-10-CM | POA: Insufficient documentation

## 2023-01-30 DIAGNOSIS — J209 Acute bronchitis, unspecified: Secondary | ICD-10-CM

## 2023-01-30 DIAGNOSIS — E782 Mixed hyperlipidemia: Secondary | ICD-10-CM | POA: Diagnosis not present

## 2023-01-30 DIAGNOSIS — I10 Essential (primary) hypertension: Secondary | ICD-10-CM | POA: Diagnosis not present

## 2023-01-30 NOTE — Progress Notes (Signed)
Established Patient Office Visit  Subjective:  Patient ID: Abigail Ortega, female    DOB: [birth year]  Age: 72 y.o. MRN: [id number]  Chief Complaint  Patient presents with   Follow-up    3 mo F/U lab results    Patient comes in for her follow-up .  She is generally feeling well but has chronic complaints of dyspepsia, bloating, abdominal discomfort.  Patient has been taking her medications regularly which include her Protonix, Bentyl, Zofran.  However she continues to have nausea, indigestion and flatulence frequently.  Her appetite is reduced and she has lost weight .  Her recent CT chest showed normal liver, gallbladder, spleen, and pancreas  Patient has seen gastroenterologist in the past.  Will send another referral so that she can be evaluated again.  Consider EGD. She denies constipation or diarrhea.  There is no fever and no chills.  She uses her inhalers regularly.  No complaints of chest pain or shortness of breath.   other concerns at this time.   Past Medical History:  Diagnosis Date   Anxiety    Arthritis    Breast cancer (HCC) 2014   Right breast cancer - chemo, radiation and Mastectomy   CHF (congestive heart failure) (HCC) 2013   Chronic kidney disease    Clotting disorder (HCC)    blood clots   Depression    GERD (gastroesophageal reflux disease)    GI bleed    H/O heart artery stent    Headache    Heart attack (HCC) 2012   coronary stent x2 completed by Dr. Juliann Pares.   Heart attack (HCC) Apr 29, 2013   Hemorrhoids    Hypertension    Hypothyroidism    IBS (irritable bowel syndrome)    Malignant neoplasm of lower-inner quadrant of female breast (HCC) January 20, 2013.   invasive mammary cancer, minimal 0.85 cm.  Histologic grade 1.   Personal history of chemotherapy 2014   BREAST CA   Personal history of radiation therapy 2014   BREAST CA   Thyroid disease     Past Surgical History:  Procedure Laterality Date   BOTOX INJECTION N/A 03/14/2017    Procedure: BOTOX INJECTION;  Surgeon: Leafy Ro, MD;  Location: [facility] ORS;  Service: General;  Laterality: N/A;   BOTOX INJECTION N/A 05/09/2018   Procedure: BOTOX INJECTION;  Surgeon: Leafy Ro, MD;  Location: [facility] ORS;  Service: General;  Laterality: N/A;   BREAST BIOPSY Right 2014   positive   BREAST SURGERY Right 02-21-2013   right mastectomy   CARDIAC CATHETERIZATION     COLONOSCOPY W/ BIOPSIES  09/15/2010   Colonoscopy completed by Lutricia Feil, M.D. Tubular adenoma of the ascending colon and descending colon reported up to 0.4 cm in diameter. No atypia.   COLONOSCOPY WITH PROPOFOL N/A 12/23/2016   Procedure: COLONOSCOPY WITH PROPOFOL;  Surgeon: Charlott Rakes, MD;  Location: [facility] ENDOSCOPY;  Service: Endoscopy;  Laterality: N/A;   ESOPHAGOGASTRODUODENOSCOPY N/A 12/13/2016   Procedure: ESOPHAGOGASTRODUODENOSCOPY (EGD);  Surgeon: Wyline Mood, MD;  Location: [facility] ENDOSCOPY;  Service: Endoscopy;  Laterality: N/A;   LEFT HEART CATH N/A 04/02/2020   Procedure: Left Heart Cath with Coronary Angiography;  Surgeon: Laurier Nancy, MD;  Location: [facility] INVASIVE CV LAB;  Service: Cardiovascular;  Laterality: N/A;   LEFT HEART CATH AND CORONARY ANGIOGRAPHY Left 08/29/2018   Procedure: LEFT HEART CATH AND CORONARY ANGIOGRAPHY;  Surgeon: Laurier Nancy, MD;  Location: [facility] INVASIVE CV LAB;  Service: Cardiovascular;  Laterality:  Left;   MASTECTOMY Right 2014   BREAST CA   PORTA CATH INSERTION     SPHINCTEROTOMY N/A 05/09/2018   Procedure: SPHINCTEROTOMY;  Surgeon: Leafy Ro, MD;  Location: [facility] ORS;  Service: General;  Laterality: N/A;   UPPER GI ENDOSCOPY  09/15/2010     Completed by Lutricia Feil, M.D. for nausea. Normal exam reported.   VASCULAR SURGERY      Social History   Socioeconomic History   Marital status: Divorced    Spouse name: Not on file   Number of children: Not on file   Years of education: Not on file   Highest education level: Not on file  Occupational History   Not  on file  Tobacco Use   Smoking status: Former    Types: Cigarettes   Smokeless tobacco: Never  Vaping Use   Vaping status: Never Used  Substance and Sexual Activity   Alcohol use: No   Drug use: No   Sexual activity: Not Currently  Other Topics Concern   Not on file  Social History Narrative   Not on file   Social Determinants of Health   Financial Resource Strain: Not on file  Food Insecurity: Not on file  Transportation Needs: Not on file  Physical Activity: Not on file  Stress: Not on file  Social Connections: Not on file  Intimate Partner Violence: Not on file    Family History  Problem Relation Age of Onset   Breast cancer Mother 63   Cancer Father        colon    Allergies  Allergen Reactions   Nausea Control [Emetrol] Nausea And Vomiting   Other     Note: burning, nausea with XR 75 mg   Effexor [Venlafaxine] Hives, Itching and Rash   Hydroxyzine Rash and Hives    Review of Systems  Constitutional:  Positive for malaise/fatigue and weight loss. Negative for chills, diaphoresis and fever.  HENT: Negative.  Negative for congestion, ear pain, sinus pain and sore throat.   Eyes: Negative.   Respiratory: Negative.  Negative for cough, shortness of breath and stridor.   Cardiovascular: Negative.  Negative for chest pain, palpitations and leg swelling.  Gastrointestinal:  Positive for heartburn and nausea. Negative for abdominal pain, blood in stool, constipation, diarrhea, melena and vomiting.  Genitourinary: Negative.  Negative for dysuria and flank pain.  Musculoskeletal: Negative.  Negative for joint pain and myalgias.  Skin: Negative.   Neurological: Negative.  Negative for dizziness and headaches.  Endo/Heme/Allergies: Negative.   Psychiatric/Behavioral: Negative.  Negative for depression and suicidal ideas. The patient is not nervous/anxious.        Objective:   BP 120/76   Pulse 78   Ht 5\' 6"  (1.676 m)   Wt 156 lb 9.6 oz (71 kg)   SpO2 96%    BMI 25.28 kg/m   Vitals:   01/30/23 1336  BP: 120/76  Pulse: 78  Height: 5\' 6"  (1.676 m)  Weight: 156 lb 9.6 oz (71 kg)  SpO2: 96%  BMI (Calculated): 25.29    Physical Exam Vitals and nursing note reviewed.  Constitutional:      Appearance: Normal appearance.  HENT:     Head: Normocephalic and atraumatic.     Nose: Nose normal.     Mouth/Throat:     Mouth: Mucous membranes are moist.     Pharynx: Oropharynx is clear.  Eyes:     Conjunctiva/sclera: Conjunctivae normal.     Pupils: Pupils are  equal, round, and reactive to light.  Cardiovascular:     Rate and Rhythm: Normal rate and regular rhythm.     Pulses: Normal pulses.     Heart sounds: Normal heart sounds. No murmur heard. Pulmonary:     Effort: Pulmonary effort is normal.     Breath sounds: Rales present. No wheezing or rhonchi.  Abdominal:     General: Bowel sounds are normal.     Palpations: Abdomen is soft. There is no mass.     Tenderness: There is no abdominal tenderness. There is no right CVA tenderness or left CVA tenderness.  Musculoskeletal:        General: Normal range of motion.     Cervical back: Normal range of motion.     Right lower leg: No edema.     Left lower leg: No edema.  Skin:    General: Skin is warm and dry.  Neurological:     General: No focal deficit present.     Mental Status: She is alert and oriented to person, place, and time.  Psychiatric:        Mood and Affect: Mood normal.        Behavior: Behavior normal.      No results found for any visits on 01/30/23.      Assessment & Plan:  Patient advised to continue taking all her medications as such.  Take her Zofran regularly so she can keep herself hydrated.  Referral sent to gastroenterologist. Problem List Items Addressed This Visit     Hyperlipidemia, unspecified   Essential hypertension, benign   COPD (chronic obstructive pulmonary disease) with acute bronchitis (HCC)   Dyspepsia   Prediabetes   Gastroesophageal  reflux disease without esophagitis - Primary   Relevant Orders   Ambulatory referral to Gastroenterology   Other irritable bowel syndrome    Return in about 3 months (around 05/02/2023).   Total time spent: 30 minutes  Margaretann Loveless, MD  01/30/2023   This document may have been prepared by [facility] Voice Recognition software and as such may include unintentional dictation errors.

## 2023-02-07 DIAGNOSIS — H2513 Age-related nuclear cataract, bilateral: Secondary | ICD-10-CM | POA: Diagnosis not present

## 2023-02-07 DIAGNOSIS — H04123 Dry eye syndrome of bilateral lacrimal glands: Secondary | ICD-10-CM | POA: Diagnosis not present

## 2023-02-07 DIAGNOSIS — H1045 Other chronic allergic conjunctivitis: Secondary | ICD-10-CM | POA: Diagnosis not present

## 2023-02-10 ENCOUNTER — Other Ambulatory Visit: Payer: Self-pay | Admitting: Cardiovascular Disease

## 2023-02-10 DIAGNOSIS — E782 Mixed hyperlipidemia: Secondary | ICD-10-CM

## 2023-02-10 DIAGNOSIS — I2510 Atherosclerotic heart disease of native coronary artery without angina pectoris: Secondary | ICD-10-CM

## 2023-02-14 ENCOUNTER — Telehealth: Payer: Self-pay | Admitting: Internal Medicine

## 2023-02-14 NOTE — Telephone Encounter (Signed)
Left vm to confirm 02/21/23 appointment-Toni

## 2023-02-19 ENCOUNTER — Telehealth: Payer: Medicare HMO

## 2023-02-21 ENCOUNTER — Ambulatory Visit (INDEPENDENT_AMBULATORY_CARE_PROVIDER_SITE_OTHER): Payer: Medicare HMO | Admitting: Internal Medicine

## 2023-02-21 DIAGNOSIS — J441 Chronic obstructive pulmonary disease with (acute) exacerbation: Secondary | ICD-10-CM | POA: Diagnosis not present

## 2023-02-26 ENCOUNTER — Encounter: Payer: Self-pay | Admitting: Emergency Medicine

## 2023-02-26 ENCOUNTER — Emergency Department: Payer: Medicare HMO

## 2023-02-26 ENCOUNTER — Emergency Department
Admission: EM | Admit: 2023-02-26 | Discharge: 2023-02-26 | Disposition: A | Discharge status: Home / Self Care | Payer: Medicare HMO | Attending: Emergency Medicine | Admitting: Emergency Medicine

## 2023-02-26 ENCOUNTER — Other Ambulatory Visit: Payer: Self-pay

## 2023-02-26 DIAGNOSIS — S52612A Displaced fracture of left ulna styloid process, initial encounter for closed fracture: Secondary | ICD-10-CM | POA: Diagnosis not present

## 2023-02-26 DIAGNOSIS — S0181XA Laceration without foreign body of other part of head, initial encounter: Secondary | ICD-10-CM | POA: Insufficient documentation

## 2023-02-26 DIAGNOSIS — H40013 Open angle with borderline findings, low risk, bilateral: Secondary | ICD-10-CM | POA: Diagnosis not present

## 2023-02-26 DIAGNOSIS — H25013 Cortical age-related cataract, bilateral: Secondary | ICD-10-CM | POA: Diagnosis not present

## 2023-02-26 DIAGNOSIS — S0990XA Unspecified injury of head, initial encounter: Secondary | ICD-10-CM | POA: Diagnosis not present

## 2023-02-26 DIAGNOSIS — Z853 Personal history of malignant neoplasm of breast: Secondary | ICD-10-CM | POA: Diagnosis not present

## 2023-02-26 DIAGNOSIS — Z79899 Other long term (current) drug therapy: Secondary | ICD-10-CM | POA: Diagnosis not present

## 2023-02-26 DIAGNOSIS — I509 Heart failure, unspecified: Secondary | ICD-10-CM | POA: Insufficient documentation

## 2023-02-26 DIAGNOSIS — N189 Chronic kidney disease, unspecified: Secondary | ICD-10-CM | POA: Insufficient documentation

## 2023-02-26 DIAGNOSIS — W01198A Fall on same level from slipping, tripping and stumbling with subsequent striking against other object, initial encounter: Secondary | ICD-10-CM | POA: Insufficient documentation

## 2023-02-26 DIAGNOSIS — S00532A Contusion of oral cavity, initial encounter: Secondary | ICD-10-CM | POA: Diagnosis not present

## 2023-02-26 DIAGNOSIS — S52552A Other extraarticular fracture of lower end of left radius, initial encounter for closed fracture: Secondary | ICD-10-CM | POA: Diagnosis not present

## 2023-02-26 DIAGNOSIS — Z7902 Long term (current) use of antithrombotics/antiplatelets: Secondary | ICD-10-CM | POA: Diagnosis not present

## 2023-02-26 DIAGNOSIS — S52502A Unspecified fracture of the lower end of left radius, initial encounter for closed fracture: Secondary | ICD-10-CM | POA: Diagnosis not present

## 2023-02-26 DIAGNOSIS — I130 Hypertensive heart and chronic kidney disease with heart failure and stage 1 through stage 4 chronic kidney disease, or unspecified chronic kidney disease: Secondary | ICD-10-CM | POA: Diagnosis not present

## 2023-02-26 DIAGNOSIS — H2513 Age-related nuclear cataract, bilateral: Secondary | ICD-10-CM | POA: Diagnosis not present

## 2023-02-26 DIAGNOSIS — Z23 Encounter for immunization: Secondary | ICD-10-CM | POA: Diagnosis not present

## 2023-02-26 DIAGNOSIS — S0993XA Unspecified injury of face, initial encounter: Secondary | ICD-10-CM | POA: Diagnosis not present

## 2023-02-26 DIAGNOSIS — I10 Essential (primary) hypertension: Secondary | ICD-10-CM | POA: Diagnosis not present

## 2023-02-26 DIAGNOSIS — W19XXXA Unspecified fall, initial encounter: Secondary | ICD-10-CM

## 2023-02-26 DIAGNOSIS — M1812 Unilateral primary osteoarthritis of first carpometacarpal joint, left hand: Secondary | ICD-10-CM | POA: Diagnosis not present

## 2023-02-26 DIAGNOSIS — H2512 Age-related nuclear cataract, left eye: Secondary | ICD-10-CM | POA: Diagnosis not present

## 2023-02-26 DIAGNOSIS — S0083XA Contusion of other part of head, initial encounter: Secondary | ICD-10-CM

## 2023-02-26 MED ORDER — HYDROCODONE-ACETAMINOPHEN 5-325 MG PO TABS
1.0000 | ORAL_TABLET | ORAL | Status: AC
  Administered 2023-02-26: 1 via ORAL
  Filled 2023-02-26: qty 1

## 2023-02-26 MED ORDER — TETANUS-DIPHTH-ACELL PERTUSSIS 5-2.5-18.5 LF-MCG/0.5 IM SUSY
0.5000 mL | PREFILLED_SYRINGE | Freq: Once | INTRAMUSCULAR | Status: AC
  Administered 2023-02-26: 0.5 mL via INTRAMUSCULAR
  Filled 2023-02-26: qty 0.5

## 2023-02-26 MED ORDER — LIDOCAINE HCL (PF) 1 % IJ SOLN
10.0000 mL | Freq: Once | INTRAMUSCULAR | Status: AC
  Administered 2023-02-26: 10 mL
  Filled 2023-02-26: qty 10

## 2023-02-26 MED ORDER — BUPIVACAINE HCL (PF) 0.5 % IJ SOLN
10.0000 mL | Freq: Once | INTRAMUSCULAR | Status: AC
  Administered 2023-02-26: 10 mL
  Filled 2023-02-26: qty 10

## 2023-02-26 MED ORDER — HYDROCODONE-ACETAMINOPHEN 5-325 MG PO TABS: 1.0000 | ORAL_TABLET | Freq: Four times a day (QID) | ORAL | 0 refills | Status: DC | PRN

## 2023-02-26 MED ORDER — LIDOCAINE-EPINEPHRINE-TETRACAINE (LET) TOPICAL GEL
3.0000 mL | Freq: Once | TOPICAL | Status: AC
  Administered 2023-02-26: 3 mL via TOPICAL
  Filled 2023-02-26: qty 3

## 2023-02-26 NOTE — Discharge Instructions (Addendum)
Please use sling and elevate left hand and wrist is much as you can.  If any swelling develops in the hand along with pain you can loosen the Ace wrap around the forearm and wrist.  Please take Norco as needed for moderate to severe pain.  He may use Tylenol for mild pain.  Keep laceration site clean covered and dry for 3 days.  After 3 days you can shower and get the laceration site wet.  Allow glue to come off on its own.    Call orthopedic office tomorrow to schedule follow-up appointment.

## 2023-02-26 NOTE — ED Triage Notes (Signed)
PT arrived in ED after a fall. Pt has a laceration to the lower part of her chin. Bleeding controlled. Pt sts that when she fell she broke the upper plate of her dentures.

## 2023-02-26 NOTE — ED Notes (Signed)
See triage note  Presents s/p fall   Fell face first  Deformity noted to left wrist  Good pulses   Laceration noted to chin with some bruising

## 2023-02-26 NOTE — ED Provider Notes (Signed)
[place] EMERGENCY DEPARTMENT AT [facility] Provider Note   CSN: [id number] Arrival date & time: 02/26/23  1637     History  Chief Complaint  Patient presents with   Abigail Ortega    Abigail Ortega is a 72 y.o. female with history of breast cancer, myocardial infarction, CHF, chronic kidney disease, hypertension, IBS presents to the emergency department for evaluation of a mechanical fall that occurred just prior to arrival.  Patient was ambulatory in a parking lot, she tripped and fell hitting her chin on the ground, she denies any prior chest pain shortness of breath dizziness or lightheadedness.  She denies a headache but has a laceration to her chin.  She has a deformity to the left wrist.  She has been ambulatory since the accident with no lower extremity discomfort or pain  Patient is independent.  She lives with family, performs ADLs for herself.  She is right-hand dominant. HPI     Home Medications Prior to Admission medications   Medication Sig Start Date End Date Taking? Authorizing Provider  HYDROcodone-acetaminophen (NORCO) 5-325 MG tablet Take 1 tablet by mouth every 6 (six) hours as needed for moderate pain. 02/26/23  Yes Evon Slack, PA-C  acetaminophen (TYLENOL) 650 MG CR tablet Take 1,300 mg by mouth every 8 (eight) hours as needed for pain.    [provider]  albuterol (VENTOLIN HFA) 108 (90 Base) MCG/ACT inhaler Inhale into the lungs. 08/27/21   [provider]  ALPRAZolam Prudy Feeler) 0.25 MG tablet Take 0.25 mg by mouth 2 (two) times daily as needed for anxiety. 04/23/15   [provider]  atorvastatin (LIPITOR) 80 MG tablet TAKE 1 TABLET EVERY DAY 02/12/23   Adrian Blackwater A, MD  azelastine (ASTELIN) 0.1 % nasal spray Place 1 spray into both nostrils 2 (two) times daily as needed for rhinitis. 04/10/17   [provider]  Budeson-Glycopyrrol-Formoterol (BREZTRI AEROSPHERE) 160-9-4.8 MCG/ACT AERO Inhale 2 puffs into the lungs 2  (two) times daily. 12/14/22   McDonough, Salomon Fick, PA-C  clopidogrel (PLAVIX) 75 MG tablet TAKE 1 TABLET EVERY DAY (NEED MD APPOINTMENT) 02/12/23   Laurier Nancy, MD  dexlansoprazole (DEXILANT) 60 MG capsule TAKE 1 CAPSULE EVERY DAY 04/18/22   Wyline Mood, MD  dicyclomine (BENTYL) 10 MG capsule TAKE 1 CAPSULE EVERY 8 HOURS FOR ABDOMINAL PAIN 11/06/22   Orson Eva, NP  ferrous sulfate 325 (65 FE) MG tablet Take 1 tablet (325 mg total) by mouth daily. 11/28/22 11/28/23  Orson Eva, NP  fexofenadine (ALLEGRA) 60 MG tablet Take 30 mg by mouth 2 (two) times daily as needed (allergies.).    [provider]  fluticasone (FLONASE) 50 MCG/ACT nasal spray BEND HEAD FORWARD AND USE 1 SPRAY IN EACH NOSTRIL EVERY DAY 11/30/22   Orson Eva, NP  furosemide (LASIX) 20 MG tablet TAKE 1 TABLET EVERY MORNING. 11/30/22   Orson Eva, NP  gabapentin (NEURONTIN) 300 MG capsule TAKE 1 CAPSULE THREE TIMES DAILY 09/06/22   Orson Eva, NP  hydrALAZINE (APRESOLINE) 25 MG tablet Take 25 mg by mouth 3 (three) times daily.    [provider]  hydrocortisone (ANUSOL-HC) 2.5 % rectal cream Place 1 Application rectally 2 (two) times daily. 11/28/22   Orson Eva, NP  hydrocortisone 2.5 % cream APPLY PEA SIZED TO RECTAL REGION EVERY 8 HOURS AS NEEDED FOR RELIEF 11/06/22   Orson Eva, NP  isosorbide mononitrate (IMDUR) 120 MG 24 hr tablet TAKE 1 TABLET TWICE DAILY 02/12/23   Adrian Blackwater  A, MD  levothyroxine (SYNTHROID) 88 MCG tablet TAKE 1 TABLET EVERY MORNING 1 HOUR PRIOR TO OTHER MEDS OR FOOD ON AN EMPTY STOMACH 10/30/22   Orson Eva, NP  Magnesium 250 MG TABS Take by mouth.    [provider]  meclizine (ANTIVERT) 25 MG tablet Take 2 tablets (50 mg total) by mouth 3 (three) times daily as needed for dizziness or nausea. 09/28/17   Sharman Cheek, MD  midodrine (PROAMATINE) 5 MG tablet Take 5 mg by mouth 3 (three) times daily.    [provider]  montelukast  (SINGULAIR) 10 MG tablet TAKE 1 TABLET AT BEDTIME 01/17/23   Miki Kins, FNP  ondansetron [facility]) 4 MG tablet  02/27/19   [provider]  pantoprazole (PROTONIX) 40 MG tablet Take by mouth. 11/04/14   [provider]  promethazine (PHENERGAN) 12.5 MG tablet Take 12.5 mg by mouth every 6 (six) hours as needed for nausea or vomiting.    [provider]  ranolazine (RANEXA) 500 MG 12 hr tablet TAKE 1 TABLET TWICE DAILY 01/17/23   Adrian Blackwater A, MD  temazepam (RESTORIL) 30 MG capsule Take 30 mg by mouth at bedtime. 02/04/17   [provider]  traZODone (DESYREL) 100 MG tablet TAKE 1 TABLET BY MOUTH NIGHTLY AT BEDTIME AS NEEDED FOR SLEEP 09/06/22   Orson Eva, NP  traZODone (DESYREL) 50 MG tablet Take 50 mg by mouth at bedtime. 10/05/14   [provider]      Allergies    Nausea control [emetrol], Other, Effexor [venlafaxine], and Hydroxyzine    Review of Systems   Review of Systems  Physical Exam Updated Vital Signs BP (!) 153/100 (BP Location: Left Arm)   Pulse 82   Temp 98.6 F (37 C) (Oral)   Resp 16   Ht 5\' 6"  (1.676 m)   Wt 68 kg   SpO2 97%   BMI 24.21 kg/m  Physical Exam Constitutional:      Appearance: She is well-developed.  HENT:     Head: Normocephalic and atraumatic.     Comments: 3 cm transverse laceration under the chin, bleeding well-controlled.  No intraoral lesions Eyes:     Conjunctiva/sclera: Conjunctivae normal.  Cardiovascular:     Rate and Rhythm: Normal rate.  Pulmonary:     Effort: Pulmonary effort is normal. No respiratory distress.  Musculoskeletal:        General: Normal range of motion.     Cervical back: Normal range of motion.     Comments: No cervical thoracic or lumbar spinous process tenderness.  No lower to right cervical or lumbar paravertebral muscle tenderness.  Both hips move well with internal ex rotation with no discomfort.  She is able to stand and walk from the wheelchair to the bed  with no antalgic gait no assistive vices.  She has no tenderness palpation along the shoulders, clavicles and sternum.  She has a deformity to the left wrist distal radius with shortening, ulnar positive deformity.  No skin breakdown noted.  Sensation is intact throughout the left upper extremity with no numbness.  Dippers radial pulse.  Skin:    General: Skin is warm.     Findings: No rash.  Neurological:     Mental Status: She is alert and oriented to person, place, and time.  Psychiatric:        Behavior: Behavior normal.        Thought Content: Thought content normal.     ED Results /  Procedures / Treatments   Labs (all labs ordered are listed, but only abnormal results are displayed) Labs Reviewed - No data to display  EKG None  Radiology DG Wrist 2 Views Left  Result Date: 02/26/2023 CLINICAL DATA:  Post reduction left wrist EXAM: LEFT WRIST - 2 VIEW COMPARISON:  Left hand radiographs dated 02/26/2023 FINDINGS: Overlying cast/splint obscures fine osseous detail. Displaced distal radial fracture, similar to the prior. Overlying soft tissue swelling/deformity. IMPRESSION: Displaced distal radial fracture, similar to the prior. Electronically Signed   By: Charline Bills M.D.   On: 02/26/2023 20:03   CT Maxillofacial Wo Contrast  Result Date: 02/26/2023 CLINICAL DATA:  Head trauma, minor (Age >= 65y) Head trauma, moderate-severe; Facial trauma, blunt fall facial injury. EXAM: CT HEAD WITHOUT CONTRAST CT MAXILLOFACIAL WITHOUT CONTRAST TECHNIQUE: Multidetector CT imaging of the head and maxillofacial structures were performed using the standard protocol without intravenous contrast. Multiplanar CT image reconstructions of the maxillofacial structures were also generated. RADIATION DOSE REDUCTION: This exam was performed according to the departmental dose-optimization program which includes automated exposure control, adjustment of the mA and/or kV according to patient size and/or use of  iterative reconstruction technique. COMPARISON:  Head CT 09/28/2017. FINDINGS: CT HEAD FINDINGS Brain: No acute intracranial hemorrhage. Gray-white differentiation is preserved. No hydrocephalus or extra-axial collection. No mass effect or midline shift. Vascular: No hyperdense vessel or unexpected calcification. Skull: No calvarial fracture or suspicious bone lesion. Skull base is unremarkable. Other: None. CT MAXILLOFACIAL FINDINGS Osseous: No fracture or mandibular dislocation. No destructive process. Orbits: Negative. No traumatic or inflammatory finding. Sinuses: Clear. Soft tissues: Soft tissue contusion along the left cheek and left aspect of the mandible. IMPRESSION: 1. No acute intracranial abnormality. 2. No acute facial bone fracture. Soft tissue contusion along the left cheek and left aspect of the mandible. Electronically Signed   By: Orvan Falconer M.D.   On: 02/26/2023 18:01   CT Head Wo Contrast  Result Date: 02/26/2023 CLINICAL DATA:  Head trauma, minor (Age >= 65y) Head trauma, moderate-severe; Facial trauma, blunt fall facial injury. EXAM: CT HEAD WITHOUT CONTRAST CT MAXILLOFACIAL WITHOUT CONTRAST TECHNIQUE: Multidetector CT imaging of the head and maxillofacial structures were performed using the standard protocol without intravenous contrast. Multiplanar CT image reconstructions of the maxillofacial structures were also generated. RADIATION DOSE REDUCTION: This exam was performed according to the departmental dose-optimization program which includes automated exposure control, adjustment of the mA and/or kV according to patient size and/or use of iterative reconstruction technique. COMPARISON:  Head CT 09/28/2017. FINDINGS: CT HEAD FINDINGS Brain: No acute intracranial hemorrhage. Gray-white differentiation is preserved. No hydrocephalus or extra-axial collection. No mass effect or midline shift. Vascular: No hyperdense vessel or unexpected calcification. Skull: No calvarial fracture or  suspicious bone lesion. Skull base is unremarkable. Other: None. CT MAXILLOFACIAL FINDINGS Osseous: No fracture or mandibular dislocation. No destructive process. Orbits: Negative. No traumatic or inflammatory finding. Sinuses: Clear. Soft tissues: Soft tissue contusion along the left cheek and left aspect of the mandible. IMPRESSION: 1. No acute intracranial abnormality. 2. No acute facial bone fracture. Soft tissue contusion along the left cheek and left aspect of the mandible. Electronically Signed   By: Orvan Falconer M.D.   On: 02/26/2023 18:01   DG Hand 2 View Left  Result Date: 02/26/2023 CLINICAL DATA:  Fall, deformity EXAM: LEFT HAND - 2 VIEW COMPARISON:  None Available. FINDINGS: Displaced distal radial fracture. Distal fracture fragment and carpus are dorsally displaced. Ulnar styloid fracture, although possibly chronic. Moderate  degenerative changes of the [facility] joint. Moderate soft tissue swelling/deformity. IMPRESSION: Displaced distal radial fracture, as above. Ulnar styloid fracture, although possibly chronic. Electronically Signed   By: Charline Bills M.D.   On: 02/26/2023 17:56    Procedures .Marland KitchenLaceration Repair  Date/Time: 02/26/2023 8:32 PM  Performed by: Evon Slack, PA-C Authorized by: Evon Slack, PA-C   Consent:    Consent obtained:  Verbal   Consent given by:  Patient Laceration details:    Location:  Face   Face location:  Chin   Length (cm):  2 Exploration:    Limited defect created (wound extended): no   Treatment:    Area cleansed with:  Povidone-iodine and saline   Amount of cleaning:  Standard   Irrigation solution:  Sterile saline   Irrigation method:  Pressure wash Skin repair:    Repair method:  Tissue adhesive Approximation:    Approximation:  Close Repair type:    Repair type:  Simple Post-procedure details:    Dressing:  Adhesive bandage   Procedure completion:  Tolerated well, no immediate complications .Ortho Injury  Treatment  Date/Time: 02/26/2023 8:33 PM  Performed by: Evon Slack, PA-C Authorized by: Evon Slack, PA-C   Consent:    Consent obtained:  Verbal   Consent given by:  PatientInjury location: wrist Location details: left wrist Injury type: fracture Fracture type: distal radius Pre-procedure neurovascular assessment: neurovascularly intact Pre-procedure distal perfusion: normal Pre-procedure neurological function: normal Pre-procedure range of motion: reduced Anesthesia: hematoma block  Anesthesia: Local anesthesia used: yes Local Anesthetic: lidocaine 1% without epinephrine and bupivacaine 0.5% without epinephrine Anesthetic total: 10 mL  Patient sedated: NoManipulation performed: yes Skin traction used: yes Skeletal traction used: yes Reduction successful: improved. X-ray confirmed reduction: yes Splint type: sugar tong Splint Applied by: ED Provider Supplies used: cotton padding, elastic bandage and Ortho-Glass Post-procedure distal perfusion: normal Post-procedure neurological function: normal Post-procedure range of motion: unchanged       Medications Ordered in ED Medications  HYDROcodone-acetaminophen (NORCO/VICODIN) 5-325 MG per tablet 1 tablet (has no administration in time range)  HYDROcodone-acetaminophen (NORCO/VICODIN) 5-325 MG per tablet 1 tablet (1 tablet Oral Given 02/26/23 1740)  lidocaine-EPINEPHrine-tetracaine (LET) topical gel (3 mLs Topical Given 02/26/23 1742)  Tdap (BOOSTRIX) injection 0.5 mL (0.5 mLs Intramuscular Given 02/26/23 1741)  lidocaine (PF) (XYLOCAINE) 1 % injection 10 mL (10 mLs Other Given by Other 02/26/23 1757)  bupivacaine(PF) (MARCAINE) 0.5 % injection 10 mL (10 mLs Infiltration Given by Other 02/26/23 1757)    ED Course/ Medical Decision Making/ A&P                                 Medical Decision Making Amount and/or Complexity of Data Reviewed Radiology: ordered.  Risk Prescription drug  management.   72 year old female with displaced left distal radius fracture from a fall that occurred earlier today.  She is neurovascular intact with no complaints of numbness or tingling.  Patient also with a chin laceration along with contusion to the mandible.  CT scan of the head and neck negative for any acute bony abnormality or intracranial process.  Wrist fracture showed displacement with offset and shortening, postreduction films show improved height and alignment but continued moderate displacement.  Patient remained neurovascular intact with splint application.  Discussed with orthopedist, will avoid further attempts at reduction and have patient follow-up in Ortho clinic.  She is given Norco for pain and  will take Tylenol for additional pain relief.  She suffered a small chin laceration this was thoroughly cleansed with Betadine and saline and repaired with Dermabond.  She is educated on wound care. Final Clinical Impression(s) / ED Diagnoses Final diagnoses:  Fall, initial encounter  Other closed extra-articular fracture of distal end of left radius, initial encounter  Chin laceration, initial encounter  Contusion of ramus of mandible    Rx / DC Orders ED Discharge Orders          Ordered    HYDROcodone-acetaminophen (NORCO) 5-325 MG tablet  Every 6 hours PRN        02/26/23 2028              Ronnette Juniper 02/26/23 2034    Pilar Jarvis, MD 02/26/23 2257

## 2023-02-26 NOTE — ED Notes (Signed)
See triage note.

## 2023-03-01 ENCOUNTER — Ambulatory Visit: Payer: Medicare HMO | Admitting: Physician Assistant

## 2023-03-01 NOTE — Procedures (Signed)
[facility] [address] [place], [address]    Complete Pulmonary Function Testing Interpretation:  FINDINGS:  Forced vital capacity is normal FEV1 is normal.  FEV1 FVC ratio is normal.  Postbronchodilator there is no significant change in FEV1.  Total lung capacity is mildly decreased.  Residual volume is normal.  Residual volume total lung capacity ratio was increased.  DLCO moderately decreased.  IMPRESSION:  This pulmonary function study is suggestive of mild restrictive lung disease.  Patient did have difficulty with the maneuvers so clinical correlation is recommended.  Yevonne Pax, MD [facility] Pulmonary Critical Care Medicine Sleep Medicine

## 2023-03-05 ENCOUNTER — Telehealth: Payer: Self-pay

## 2023-03-05 ENCOUNTER — Other Ambulatory Visit: Payer: Medicare HMO

## 2023-03-05 NOTE — Telephone Encounter (Signed)
Transition Care Management Unsuccessful Follow-up Telephone Call  Date of discharge and from where: [facility] 8/12  Attempts:  1st Attempt  Reason for unsuccessful TCM follow-up call:  No answer/busy   Lenard Forth [facility] Guide, [facility] [phone] [address] [place], [place], [place] [address] Phone: [phone] Email: [email or web address]

## 2023-03-06 ENCOUNTER — Telehealth: Payer: Self-pay

## 2023-03-06 NOTE — Telephone Encounter (Signed)
Transition Care Management Unsuccessful Follow-up Telephone Call  Date of discharge and from where:  [facility] 8/12  Attempts:  2nd Attempt  Reason for unsuccessful TCM follow-up call:  No answer/busy   Lenard Forth [facility] Guide, [facility] [phone] [address] [place], [place], [place] [address] Phone: [phone] Email: [email or web address]

## 2023-03-07 ENCOUNTER — Other Ambulatory Visit: Payer: Self-pay | Admitting: Orthopedic Surgery

## 2023-03-07 DIAGNOSIS — S52532A Colles' fracture of left radius, initial encounter for closed fracture: Secondary | ICD-10-CM | POA: Diagnosis not present

## 2023-03-08 ENCOUNTER — Encounter (HOSPITAL_COMMUNITY): Payer: Self-pay | Admitting: Anesthesiology

## 2023-03-08 ENCOUNTER — Encounter: Payer: Self-pay | Admitting: Orthopedic Surgery

## 2023-03-08 ENCOUNTER — Telehealth: Payer: Self-pay | Admitting: Internal Medicine

## 2023-03-08 NOTE — Telephone Encounter (Signed)
[facility] called stating that they need documentation that this pt can be put under anesthesia. States they will fax over a clearance

## 2023-03-08 NOTE — Anesthesia Preprocedure Evaluation (Signed)
Anesthesia Evaluation    Airway        Dental   Pulmonary former smoker 03-24-23 PFT pulmonary function study is suggestive of mild restrictive lung disease.  Patient did have difficulty with the maneuvers so clinical correlation is recommended.          Cardiovascular hypertension,   09-05-16 echo EF 40$, mild TR, mild PR, trivial AR LEFT VENTRICLE          Size:MILDLY ENLARGED         Anterior:HYPOCONTRACTILE   Contraction:MILD GLOBAL DECREASE     Lateral:HYPOCONTRACTILE    Closest EF:40% (Estimated)           Septal:HYPOCONTRACTILE     LV Masses:No Masses                 Apical:HYPOCONTRACTILE           MWN:UUVO LVH                Inferior:HYPOCONTRACTILE                                      Posterior:HYPOCONTRACTILE  Dias.FxClass:N/A   Hx heart attack and 2 stents 2012 Hx CHF 2013 Hx heart attack 04/29/13  04/02/20  1st Mrg lesion is 15% stenosed.  Prox LAD to Mid LAD lesion is 40% stenosed.  Ost LAD to Prox LAD lesion is 35% stenosed.   Stent in OM patent mild to moderate mid LAD disease, treat medically.      Neuro/Psych    GI/Hepatic   Endo/Other    Renal/GU      Musculoskeletal   Abdominal   Peds  Hematology   Anesthesia Other Findings CHF (congestive heart failure) (HCC) IBS (irritable bowel syndrome) Thyroid disease Hemorrhoids Clotting disorder (HCC)  Heart attack (HCC) Malignant neoplasm of lower-inner quadrant of female breast (HCC)  Heart attack 2014 Hypertension  GI bleed Arthritis  GERD (gastroesophageal reflux disease) Hypothyroidism  Personal history of chemotherapy Personal history of radiation therapy  Breast cancer (HCC) Headache  Anxiety Depression  H/O heart artery stent Chronic kidney disease 3b     Reproductive/Obstetrics                              Anesthesia Physical Anesthesia Plan  ASA: 3  Anesthesia Plan:    Post-op Pain  Management:    Induction:   PONV Risk Score and Plan:   Airway Management Planned:   Additional Equipment:   Intra-op Plan:   Post-operative Plan:   Informed Consent:   Plan Discussed with:   Anesthesia Plan Comments:          Anesthesia Quick Evaluation

## 2023-03-09 ENCOUNTER — Other Ambulatory Visit: Payer: Self-pay | Admitting: Orthopedic Surgery

## 2023-03-12 ENCOUNTER — Ambulatory Visit (INDEPENDENT_AMBULATORY_CARE_PROVIDER_SITE_OTHER): Payer: Medicare HMO | Admitting: Cardiovascular Disease

## 2023-03-12 ENCOUNTER — Encounter: Payer: Self-pay | Admitting: Cardiovascular Disease

## 2023-03-12 VITALS — BP 99/68 | HR 102 | Ht 66.0 in | Wt 139.4 lb

## 2023-03-12 DIAGNOSIS — R0789 Other chest pain: Secondary | ICD-10-CM | POA: Diagnosis not present

## 2023-03-12 DIAGNOSIS — I249 Acute ischemic heart disease, unspecified: Secondary | ICD-10-CM

## 2023-03-12 DIAGNOSIS — E782 Mixed hyperlipidemia: Secondary | ICD-10-CM

## 2023-03-12 DIAGNOSIS — Z01810 Encounter for preprocedural cardiovascular examination: Secondary | ICD-10-CM | POA: Diagnosis not present

## 2023-03-12 DIAGNOSIS — I10 Essential (primary) hypertension: Secondary | ICD-10-CM | POA: Diagnosis not present

## 2023-03-12 NOTE — Progress Notes (Signed)
Cardiology Office Note   Date:  03/12/2023   ID:  [name], [name] 09-01-1950, MRN [id number]  PCP:  Margaretann Loveless, MD  Cardiologist:  Adrian Blackwater, MD      History of Present Illness: Abigail Ortega is a 72 y.o. female who presents for  Chief Complaint  Patient presents with   Follow-up    Has occasional chest pain and needs surgery of wrist as fell and had fracture.      Past Medical History:  Diagnosis Date   Anxiety    Arthritis    Breast cancer (HCC) 2014   Right breast cancer - chemo, radiation and Mastectomy   CHF (congestive heart failure) (HCC) 2013   Chronic kidney disease    Clotting disorder (HCC)    blood clots   Depression    GERD (gastroesophageal reflux disease)    GI bleed    H/O heart artery stent    Headache    Heart attack (HCC) 2012   coronary stent x2 completed by Dr. Juliann Pares.   Heart attack (HCC) Apr 29, 2013   Hemorrhoids    Hypertension    Hypothyroidism    IBS (irritable bowel syndrome)    Malignant neoplasm of lower-inner quadrant of female breast (HCC) January 20, 2013.   invasive mammary cancer, minimal 0.85 cm.  Histologic grade 1.   Personal history of chemotherapy 2014   BREAST CA   Personal history of radiation therapy 2014   BREAST CA   Thyroid disease      Past Surgical History:  Procedure Laterality Date   BOTOX INJECTION N/A 03/14/2017   Procedure: BOTOX INJECTION;  Surgeon: Leafy Ro, MD;  Location: [facility] ORS;  Service: General;  Laterality: N/A;   BOTOX INJECTION N/A 05/09/2018   Procedure: BOTOX INJECTION;  Surgeon: Leafy Ro, MD;  Location: [facility] ORS;  Service: General;  Laterality: N/A;   BREAST BIOPSY Right 2014   positive   BREAST SURGERY Right 02-21-2013   right mastectomy   CARDIAC CATHETERIZATION     COLONOSCOPY W/ BIOPSIES  09/15/2010   Colonoscopy completed by Lutricia Feil, M.D. Tubular adenoma of the ascending colon and descending colon reported up to 0.4 cm in diameter. No atypia.    COLONOSCOPY WITH PROPOFOL N/A 12/23/2016   Procedure: COLONOSCOPY WITH PROPOFOL;  Surgeon: Charlott Rakes, MD;  Location: [facility] ENDOSCOPY;  Service: Endoscopy;  Laterality: N/A;   ESOPHAGOGASTRODUODENOSCOPY N/A 12/13/2016   Procedure: ESOPHAGOGASTRODUODENOSCOPY (EGD);  Surgeon: Wyline Mood, MD;  Location: [facility] ENDOSCOPY;  Service: Endoscopy;  Laterality: N/A;   LEFT HEART CATH N/A 04/02/2020   Procedure: Left Heart Cath with Coronary Angiography;  Surgeon: Laurier Nancy, MD;  Location: [facility] INVASIVE CV LAB;  Service: Cardiovascular;  Laterality: N/A;   LEFT HEART CATH AND CORONARY ANGIOGRAPHY Left 08/29/2018   Procedure: LEFT HEART CATH AND CORONARY ANGIOGRAPHY;  Surgeon: Laurier Nancy, MD;  Location: [facility] INVASIVE CV LAB;  Service: Cardiovascular;  Laterality: Left;   MASTECTOMY Right 2014   BREAST CA   PORTA CATH INSERTION     SPHINCTEROTOMY N/A 05/09/2018   Procedure: SPHINCTEROTOMY;  Surgeon: Leafy Ro, MD;  Location: [facility] ORS;  Service: General;  Laterality: N/A;   UPPER GI ENDOSCOPY  09/15/2010     Completed by Lutricia Feil, M.D. for nausea. Normal exam reported.   VASCULAR SURGERY       Current Outpatient Medications  Medication Sig Dispense Refill   acetaminophen (TYLENOL) 650 MG CR tablet Take  1,300 mg by mouth every 8 (eight) hours as needed for pain.     albuterol (VENTOLIN HFA) 108 (90 Base) MCG/ACT inhaler Inhale into the lungs.     ALPRAZolam (XANAX) 0.25 MG tablet Take 0.25 mg by mouth 2 (two) times daily as needed for anxiety.  2   atorvastatin (LIPITOR) 80 MG tablet TAKE 1 TABLET EVERY DAY 90 tablet 3   azelastine (ASTELIN) 0.1 % nasal spray Place 1 spray into both nostrils 2 (two) times daily as needed for rhinitis.  4   Budeson-Glycopyrrol-Formoterol (BREZTRI AEROSPHERE) 160-9-4.8 MCG/ACT AERO Inhale 2 puffs into the lungs 2 (two) times daily. 3 each 3   clopidogrel (PLAVIX) 75 MG tablet TAKE 1 TABLET EVERY DAY (NEED MD APPOINTMENT) 90 tablet 3   dexlansoprazole  (DEXILANT) 60 MG capsule TAKE 1 CAPSULE EVERY DAY 90 capsule 3   dicyclomine (BENTYL) 10 MG capsule TAKE 1 CAPSULE EVERY 8 HOURS FOR ABDOMINAL PAIN 270 capsule 3   ferrous sulfate 325 (65 FE) MG tablet Take 1 tablet (325 mg total) by mouth daily. 90 tablet 3   fexofenadine (ALLEGRA) 60 MG tablet Take 30 mg by mouth 2 (two) times daily as needed (allergies.). (Patient not taking: Reported on 03/08/2023)     fluticasone (FLONASE) 50 MCG/ACT nasal spray BEND HEAD FORWARD AND USE 1 SPRAY IN EACH NOSTRIL EVERY DAY (Patient not taking: Reported on 03/08/2023) 32 g 3   furosemide (LASIX) 20 MG tablet TAKE 1 TABLET EVERY MORNING. (Patient not taking: Reported on 03/08/2023) 90 tablet 3   gabapentin (NEURONTIN) 300 MG capsule TAKE 1 CAPSULE THREE TIMES DAILY 270 capsule 3   hydrALAZINE (APRESOLINE) 25 MG tablet Take 25 mg by mouth 3 (three) times daily.     HYDROcodone-acetaminophen (NORCO) 5-325 MG tablet Take 1 tablet by mouth every 6 (six) hours as needed for moderate pain. (Patient not taking: Reported on 03/08/2023) 20 tablet 0   hydrocortisone (ANUSOL-HC) 2.5 % rectal cream Place 1 Application rectally 2 (two) times daily. 90 g 1   hydrocortisone 2.5 % cream APPLY PEA SIZED TO RECTAL REGION EVERY 8 HOURS AS NEEDED FOR RELIEF 20 g 11   isosorbide mononitrate (IMDUR) 120 MG 24 hr tablet TAKE 1 TABLET TWICE DAILY 180 tablet 3   levothyroxine (SYNTHROID) 88 MCG tablet TAKE 1 TABLET EVERY MORNING 1 HOUR PRIOR TO OTHER MEDS OR FOOD ON AN EMPTY STOMACH 90 tablet 3   Magnesium 250 MG TABS Take by mouth.     meclizine (ANTIVERT) 25 MG tablet Take 2 tablets (50 mg total) by mouth 3 (three) times daily as needed for dizziness or nausea. 30 tablet 1   midodrine (PROAMATINE) 5 MG tablet Take 5 mg by mouth 3 (three) times daily.     montelukast (SINGULAIR) 10 MG tablet TAKE 1 TABLET AT BEDTIME 90 tablet 3   ondansetron (ZOFRAN) 4 MG tablet      ranolazine (RANEXA) 500 MG 12 hr tablet TAKE 1 TABLET TWICE DAILY 180  tablet 3   temazepam (RESTORIL) 30 MG capsule Take 30 mg by mouth at bedtime.     traZODone (DESYREL) 100 MG tablet TAKE 1 TABLET BY MOUTH NIGHTLY AT BEDTIME AS NEEDED FOR SLEEP (Patient taking differently: Take 50 mg by mouth at bedtime.) 90 tablet 3   No current facility-administered medications for this visit.   Facility-Administered Medications Ordered in Other Visits  Medication Dose Route Frequency Provider Last Rate Last Admin   sodium chloride flush (NS) 0.9 % injection 3 mL  3 mL Intravenous Q12H Adrian Blackwater A, MD        Allergies:   Nausea control [emetrol], Other, Effexor [venlafaxine], and Hydroxyzine    Social History:   reports that she quit smoking about 10 years ago. Her smoking use included cigarettes. She started smoking about 30 years ago. She has a 20 pack-year smoking history. She has never used smokeless tobacco. She reports that she does not drink alcohol and does not use drugs.   Family History:  family history includes Breast cancer (age of onset: 22) in her mother; Cancer in her father.    ROS:     Review of Systems  Constitutional: Negative.   HENT: Negative.    Eyes: Negative.   Respiratory: Negative.    Gastrointestinal: Negative.   Genitourinary: Negative.   Musculoskeletal: Negative.   Skin: Negative.   Neurological: Negative.   Endo/Heme/Allergies: Negative.   Psychiatric/Behavioral: Negative.    All other systems reviewed and are negative.     All other systems are reviewed and negative.    PHYSICAL EXAM: VS:  BP 99/68   Pulse (!) 102   Ht 5\' 6"  (1.676 m)   Wt 139 lb 6.4 oz (63.2 kg)   SpO2 95%   BMI 22.50 kg/m  , BMI Body mass index is 22.5 kg/m. Last weight:  Wt Readings from Last 3 Encounters:  03/12/23 139 lb 6.4 oz (63.2 kg)  02/26/23 150 lb (68 kg)  01/30/23 156 lb 9.6 oz (71 kg)     Physical Exam Constitutional:      Appearance: Normal appearance.  Cardiovascular:     Rate and Rhythm: Normal rate and regular rhythm.      Heart sounds: Normal heart sounds.  Pulmonary:     Effort: Pulmonary effort is normal.     Breath sounds: Normal breath sounds.  Musculoskeletal:     Right lower leg: No edema.     Left lower leg: No edema.  Neurological:     Mental Status: She is alert.       EKG: NSR no acute changes 82/min old IWMI, unchanged  Recent Labs: 11/07/2022: Platelets 329 11/24/2022: ALT 12; BUN 12; Creatinine, Ser 1.74; Hemoglobin 9.6; Potassium 4.3; Sodium 137; TSH 2.160    Lipid Panel    Component Value Date/Time   CHOL 170 11/24/2022 1037   TRIG 67 11/24/2022 1037   HDL 95 11/24/2022 1037   CHOLHDL 1.8 11/24/2022 1037   CHOLHDL 2.0 12/22/2016 0422   VLDL 10 12/22/2016 0422   LDLCALC 62 11/24/2022 1037     REASON FOR VISIT  Visit for: Echocardiogram/I35.1  Sex:   Female  wt= 177   lbs.  BP=134/60  Height= 65   inches.        TESTS  Imaging: Echocardiogram:  An echocardiogram in (2-d) mode was performed and in Doppler mode with color flow velocity mapping was performed. The aortic valve cusps are abnormal 1.7  cm, flow velocity 1.32   m/s, and systolic calculated mean flow gradient 4  mmHg. Mitral valve diastolic peak flow velocity E .859   m/s and E/A ratio 0.6. Aortic root diameter 3.4   cm. The LVOT internal diameter 3.5   cm and flow velocity was abnormal .978  m/s. LV systolic dimension 2.21   cm, diastolic 5.36 cm, posterior wall thickness 1.34    cm, fractional shortening 58.8  %, and EF 88.2  %. IVS thickness 0.829  cm. LA dimension 4.0 cm. Mitral Valve has Moderate  Regurgitation. Pulmonic Valve has Trace Regurgitation. Tricuspid Valve has Mild Regurgitation.     ASSESSMENT  Technically adequate study.  Normal left ventricular systolic function.  Mild left ventricular hypertrophy with GRADE 3 (restrictive physiology) diastolic dysfunction.  Normal right ventricular systolic function.  Normal right ventricular diastolic function.  Normal left ventricular wall  motion.  Normal right ventricular wall motion.  Trace pulmonary regurgitation.  Mild tricuspid regurgitation.  Mild pulmonary hypertension.  Moderate mitral regurgitation.  No pericardial effusion.  Mildly dilated Left and Right atrium  Mild LVH.     THERAPY   Referring physician: Laurier Nancy  Sonographer: Adrian Blackwater.      Adrian Blackwater MD  Electronically signed by: Adrian Blackwater     Date: 05/23/2022 11:51 REASON FOR VISIT  Referred by Dr.Misha Antonini Welton Flakes.        TESTS  Imaging: Computed Tomographic Angiography:  Cardiac multidetector CT was performed paying particular attention to the coronary arteries for the diagnosis of: CAD. Diagnostic Drugs:  Administered iohexol (Omnipaque) through an antecubital vein and images from the examination were analyzed for the presence and extent of coronary artery disease, using 3D image processing software. 100 mL of non-ionic contrast (Omnipaque) was used.        TEST CONCLUSIONS  Quality of study: Suboptimal/Poor.  1-Calcium score: 704.7  2-Left dominant system.  3-Stent in LCX is patient. Mild disease in LAD.      Adrian Blackwater MD  Electronically signed by: Adrian Blackwater     Date: 10/09/2019 15:50  Other studies Reviewed: Additional studies/ records that were reviewed today include:  Review of the above records demonstrates:       No data to display            ASSESSMENT AND PLAN:    ICD-10-CM   1. Pre-operative cardiovascular examination  Z01.810    will do echo    2. Mixed hyperlipidemia  E78.2     3. Coronary syndrome, acute (HCC)  I24.9     4. Primary hypertension  I10     5. Essential hypertension, benign  I10     6. Other chest pain  R07.89    Has chest pain intermittantly       Problem List Items Addressed This Visit       Cardiovascular and Mediastinum   Essential hypertension, benign   Coronary syndrome, acute (HCC)     Other   Hyperlipidemia, unspecified   Other Visit Diagnoses      Pre-operative cardiovascular examination    -  Primary   will do echo   Primary hypertension       Other chest pain       Has chest pain intermittantly          Disposition:   Return in about 2 weeks (around 03/26/2023) for echo this week and f/u this week.    Total time spent: 40 minutes  Signed,  Adrian Blackwater, MD  03/12/2023 11:11 AM    [facility]

## 2023-03-13 ENCOUNTER — Encounter: Payer: Self-pay | Admitting: Cardiovascular Disease

## 2023-03-13 SURGERY — OPEN REDUCTION INTERNAL FIXATION (ORIF) DISTAL RADIUS FRACTURE
Anesthesia: Choice | Laterality: Left

## 2023-03-14 DIAGNOSIS — S52532D Colles' fracture of left radius, subsequent encounter for closed fracture with routine healing: Secondary | ICD-10-CM | POA: Diagnosis not present

## 2023-03-15 ENCOUNTER — Other Ambulatory Visit: Payer: Self-pay | Admitting: Surgery

## 2023-03-15 ENCOUNTER — Other Ambulatory Visit: Payer: Medicare HMO

## 2023-03-16 ENCOUNTER — Other Ambulatory Visit: Payer: Self-pay | Admitting: Cardiovascular Disease

## 2023-03-16 ENCOUNTER — Other Ambulatory Visit: Payer: Self-pay

## 2023-03-16 ENCOUNTER — Inpatient Hospital Stay: Admission: RE | Admit: 2023-03-16 | Payer: Medicare HMO | Source: Ambulatory Visit

## 2023-03-16 ENCOUNTER — Ambulatory Visit: Payer: Medicare HMO | Admitting: Cardiovascular Disease

## 2023-03-16 DIAGNOSIS — R079 Chest pain, unspecified: Secondary | ICD-10-CM

## 2023-03-20 ENCOUNTER — Ambulatory Visit: Admission: RE | Admit: 2023-03-20 | Payer: Medicare HMO | Source: Home / Self Care | Admitting: Surgery

## 2023-03-20 ENCOUNTER — Other Ambulatory Visit: Payer: Medicare HMO

## 2023-03-20 SURGERY — OPEN REDUCTION INTERNAL FIXATION (ORIF) WRIST FRACTURE
Anesthesia: Choice | Site: Wrist | Laterality: Left

## 2023-03-22 ENCOUNTER — Ambulatory Visit: Payer: Medicare HMO | Admitting: Cardiovascular Disease

## 2023-03-26 ENCOUNTER — Ambulatory Visit: Payer: Medicare HMO | Admitting: Physician Assistant

## 2023-04-10 ENCOUNTER — Encounter: Payer: Self-pay | Admitting: Gastroenterology

## 2023-04-10 ENCOUNTER — Ambulatory Visit: Payer: Medicare HMO | Admitting: Gastroenterology

## 2023-04-10 ENCOUNTER — Other Ambulatory Visit: Payer: Self-pay

## 2023-04-10 VITALS — BP 113/74 | HR 90 | Temp 98.6°F | Wt 140.0 lb

## 2023-04-10 DIAGNOSIS — R1310 Dysphagia, unspecified: Secondary | ICD-10-CM

## 2023-04-10 DIAGNOSIS — R630 Anorexia: Secondary | ICD-10-CM

## 2023-04-10 DIAGNOSIS — R634 Abnormal weight loss: Secondary | ICD-10-CM | POA: Diagnosis not present

## 2023-04-10 DIAGNOSIS — K5909 Other constipation: Secondary | ICD-10-CM

## 2023-04-10 MED ORDER — OMEPRAZOLE 40 MG PO CPDR: 40.0000 mg | DELAYED_RELEASE_CAPSULE | Freq: Every day | ORAL | 3 refills | Status: DC

## 2023-04-10 NOTE — Progress Notes (Unsigned)
Wyline Mood MD, MRCP(U.K) [address]  [address]  [place], [place] [address]  Main: [phone]  Fax: [phone]   Primary Care Physician: Margaretann Loveless, MD  Primary Gastroenterologist:  Dr. Wyline Mood   Chief Complaint  Patient presents with   Abdominal Pain    HPI: Abigail Ortega is a 72 y.o. 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  Also had Botox injection in 2019.  She also had a  hiatal hernia on imaging in the past.  Symptoms of reflux and abdominal discomfort had resolved with Bentyl and PPI.   12/23/2016: Colonoscopy performed by Dr. Bosie Clos showed internal hemorrhoids as well external hemorrhoids.  2 mm polyp resected in the cecum.  Colon prep was fair.  Diverticulosis of the colon was noted.  Upper endoscopy in May 2018 5 cm hiatal hernia was noted.    She was seen by Dr. Everlene Farrier in March 2020 for the anal fissure treated conservatively with sitz bath and nifedipine cream.  She has had Botox injected in October 2019   Interval history over the past 2 years 12/18/2022 hemoglobin 10.8 g MCV 97 Creatinine 1.68 LFTs not checked recently.  TSH normal.  HbA1c 4.8.  She says that she has lost 50 pounds of weight hence she has coming to see me.  Weight loss has been unintentional over the past few months she has absolutely no appetite.  Denies any reflux-like symptoms not on a PPI has dysphagia at times has a bowel movement once in a blue moon that she quotes.  Not taking anything for constipation.  Denies any blood in the stool.  Here with her sister.  Current Outpatient Medications  Medication Sig Dispense Refill   acetaminophen (TYLENOL) 650 MG CR tablet Take 1,300 mg by mouth every 8 (eight) hours as needed for pain.     albuterol (VENTOLIN HFA) 108 (90 Base) MCG/ACT inhaler Inhale into  the lungs.     ALPRAZolam (XANAX) 0.25 MG tablet Take 0.25 mg by mouth 2 (two) times daily as needed for anxiety.  2   Aspirin 81 MG CAPS Aspirin 81 MG Oral Tablet QTY: 0 tablet Days: 0 Refills: 0  Written: 08/08/18 Patient Instructions:     atorvastatin (LIPITOR) 80 MG tablet TAKE 1 TABLET EVERY DAY 90 tablet 3   azelastine (ASTELIN) 0.1 % nasal spray Place 1 spray into both nostrils 2 (two) times daily as needed for rhinitis.  4   budesonide-formoterol (SYMBICORT) 160-4.5 MCG/ACT inhaler Inhale 2 puffs into the lungs.     clopidogrel (PLAVIX) 75 MG tablet TAKE 1 TABLET EVERY DAY (NEED MD APPOINTMENT) 90 tablet 3   dexlansoprazole (DEXILANT) 60 MG capsule TAKE 1 CAPSULE EVERY DAY 90 capsule 3   dicyclomine (BENTYL) 10 MG capsule TAKE 1 CAPSULE EVERY 8 HOURS FOR ABDOMINAL PAIN 270 capsule 3   ergocalciferol (VITAMIN D2) 1.25 MG (50000 UT) capsule 50,000 Units once a week.     ferrous sulfate 325 (65 FE) MG tablet Take 1 tablet (325 mg total) by mouth daily. 90 tablet 3   fexofenadine (ALLEGRA) 60 MG tablet Take 30 mg by mouth 2 (two) times daily as needed (allergies.).     fluorometholone (FML) 0.1 % ophthalmic suspension Place 1 drop into both eyes 2 (two) times daily.     fluticasone (FLONASE)  50 MCG/ACT nasal spray BEND HEAD FORWARD AND USE 1 SPRAY IN EACH NOSTRIL EVERY DAY 32 g 3   furosemide (LASIX) 20 MG tablet TAKE 1 TABLET EVERY MORNING. 90 tablet 3   gabapentin (NEURONTIN) 300 MG capsule TAKE 1 CAPSULE THREE TIMES DAILY 270 capsule 3   gatifloxacin (ZYMAXID) 0.5 % SOLN Place 1 drop into both eyes daily.     hydrALAZINE (APRESOLINE) 25 MG tablet Take 25 mg by mouth 3 (three) times daily.     HYDROcodone-acetaminophen (NORCO) 5-325 MG tablet Take 1 tablet by mouth every 6 (six) hours as needed for moderate pain. 20 tablet 0   hydrocortisone (ANUSOL-HC) 2.5 % rectal cream Place 1 Application rectally 2 (two) times daily. 90 g 1   hydrocortisone (ANUSOL-HC) 2.5 % rectal cream Place 1  Application rectally 3 (three) times daily.     hydrocortisone 2.5 % cream APPLY PEA SIZED TO RECTAL REGION EVERY 8 HOURS AS NEEDED FOR RELIEF 20 g 11   isosorbide mononitrate (IMDUR) 120 MG 24 hr tablet TAKE 1 TABLET TWICE DAILY 180 tablet 3   ketorolac (ACULAR) 0.5 % ophthalmic solution Place 1 drop into the left eye 4 (four) times daily.     letrozole (FEMARA) 2.5 MG tablet Take 2.5 mg by mouth daily.     levocetirizine (XYZAL) 5 MG tablet Take 5 mg by mouth every evening.     levothyroxine (SYNTHROID) 88 MCG tablet TAKE 1 TABLET EVERY MORNING 1 HOUR PRIOR TO OTHER MEDS OR FOOD ON AN EMPTY STOMACH 90 tablet 3   Magnesium 250 MG TABS Take by mouth.     meclizine (ANTIVERT) 25 MG tablet Take 2 tablets (50 mg total) by mouth 3 (three) times daily as needed for dizziness or nausea. 30 tablet 1   midodrine (PROAMATINE) 5 MG tablet Take 5 mg by mouth 3 (three) times daily.     montelukast (SINGULAIR) 10 MG tablet TAKE 1 TABLET AT BEDTIME 90 tablet 3   ondansetron (ZOFRAN) 4 MG tablet      Probiotic Product (4X PROBIOTIC) TABS 4X Probiotic Oral Tablet QTY: 0 tablet Days: 0 Refills: 0  Written: 03/28/18 Patient Instructions: qd     promethazine (PHENERGAN) 12.5 MG tablet Take 12.5 mg by mouth every 4 (four) hours as needed.     ranolazine (RANEXA) 500 MG 12 hr tablet TAKE 1 TABLET TWICE DAILY 180 tablet 3   Rimegepant Sulfate (NURTEC) 75 MG TBDP Nurtec 75 MG Oral Tablet Disintegrating QTY: 8 tablet Days: 30 Refills: 2  Written: 07/01/20 Patient Instructions: Take 1 tablet by mouth x 1 dose at time of headache, do not take an additional tablet until 24 hours later if needed     temazepam (RESTORIL) 30 MG capsule Take 30 mg by mouth at bedtime.     traZODone (DESYREL) 100 MG tablet TAKE 1 TABLET BY MOUTH NIGHTLY AT BEDTIME AS NEEDED FOR SLEEP (Patient taking differently: Take 50 mg by mouth at bedtime.) 90 tablet 3   No current facility-administered medications for this visit.   Facility-Administered  Medications Ordered in Other Visits  Medication Dose Route Frequency Provider Last Rate Last Admin   sodium chloride flush (NS) 0.9 % injection 3 mL  3 mL Intravenous Q12H Adrian Blackwater A, MD        Allergies as of 04/10/2023 - Review Complete 04/10/2023  Allergen Reaction Noted   Nausea control [emetrol] Nausea And Vomiting 06/19/2017   Other  01/09/2017   Effexor [venlafaxine] Hives, Itching, and Rash 08/21/2022  Hydroxyzine Rash and Hives 12/20/2018    ROS:  General: Negative for anorexia, weight loss, fever, chills, fatigue, weakness. ENT: Negative for hoarseness, difficulty swallowing , nasal congestion. CV: Negative for chest pain, angina, palpitations, dyspnea on exertion, peripheral edema.  Respiratory: Negative for dyspnea at rest, dyspnea on exertion, cough, sputum, wheezing.  GI: See history of present illness. GU:  Negative for dysuria, hematuria, urinary incontinence, urinary frequency, nocturnal urination.  Endo: Negative for unusual weight change.    Physical Examination:   BP 113/74   Pulse 90   Temp 98.6 F (37 C) (Oral)   Wt 140 lb (63.5 kg)   BMI 22.60 kg/m   General: Well-nourished, well-developed in no acute distress.  Eyes: No icterus. Conjunctivae pink. Mouth: Oropharyngeal mucosa moist and pink , no lesions erythema or exudate. Neuro: Alert and oriented x 3.  Grossly intact. Skin: Warm and dry, no jaundice.   Psych: Alert and cooperative, normal mood and affect.   Imaging Studies: No results found.  Assessment and Plan:   Abigail Ortega is a 72 y.o. y/o female here today to see me for unintentional weight loss of over 50 pounds over the past few months complete loss of appetite.  Dysphagia.  I noted in her medication list she is on Tylenol 650 mg Q8 hourly which is above the recommended dose.  Plan 1.  Stop Tylenol, check LFTs and INR 2.  EGD and colonoscopy urgent for unintentional weight loss and obtain CT scan of the abdomen to rule  out underlying malignancy if present 3.  Check TSH 4.  Commence on Prilosec 40 mg once a day to empirically treat any silent reflux and commence on MiraLAX 1 capful twice daily for constipation which may be causing her to have an impaired appetite 5.  If above does not help and continues to lose weight get CT scan of the chest at next visit   I have discussed alternative options, risks & benefits,  which include, but are not limited to, bleeding, infection, perforation,respiratory complication & drug reaction.  The patient agrees with this plan & written consent will be obtained.       Dr Wyline Mood  MD,MRCP [facility]) Follow up in 6 weeks

## 2023-04-10 NOTE — Patient Instructions (Addendum)
Polyethylene Glycol Powder for Solution (Miralax) Please take Miralax twice a day.  What is this medication? POLYETHYLENE GLYCOL (pol ee ETH i leen; GLYE col) prevents and treats occasional constipation. It works by increasing the amount of water your intestine absorbs. This softens the stool, making it easier to have a bowel movement. It also increases pressure, which prompts the muscles in your intestines to move stool. It belongs to a group of medications called laxatives. This medicine may be used for other purposes; ask your health care provider or pharmacist if you have questions. COMMON BRAND NAME(S): GaviLax, GIALAX, GlycoLax, Healthylax, MiraLax, Smooth LAX, [facility] [facility] What should I tell my care team before I take this medication? They need to know if you have any of these conditions: History of blockage in your bowels Nausea Phenylketonuria Stomach or intestine problem Stomach pain Sudden change in bowel habit lasting more than 2 weeks Vomiting An unusual or allergic reaction to polyethylene glycol (PEG), other medications, foods, dyes, or preservatives Pregnant or trying to get pregnant Breast-feeding How should I use this medication? Take this medication by mouth. Take it as directed on the label. Add the right dose to 4 to 8 ounces or 120 to 240 mL of water, juice, soda, coffee, or tea. Do not mix this medication with foods or other liquids. Do not combine with starch-based thickeners (e.g., flour, cornstarch, arrowroot, tapioca, xanthan gum). Mix well. Drink the solution. Do not use it more often than directed. Talk to your care team about the use of this medication in children. While it may be given to children as young as 16 years for selected conditions, precautions do apply. Overdosage: If you think you have taken too much of this medicine contact a poison control center or emergency room at once. NOTE: This medicine is only for you. Do not share this medicine with  others. What if I miss a dose? If you miss a dose, take it as soon as you can. If it is almost time for your next dose, take only that dose. Do not take double or extra doses. What may interact with this medication? Interactions are not expected. This list may not describe all possible interactions. Give your health care provider a list of all the medicines, herbs, non-prescription drugs, or dietary supplements you use. Also tell them if you smoke, drink alcohol, or use illegal drugs. Some items may interact with your medicine. What should I watch for while using this medication? Do not use for more than one week without advice from your care team. If your constipation returns, check with your care team. Drink plenty of water while taking this medication. Drinking water helps decrease constipation. Stop using this medication and contact your care team if you experience any rectal bleeding or do not have a bowel movement after use. These could be signs of a more serious condition. What side effects may I notice from receiving this medication? Side effects that you should report to your care team as soon as possible: Allergic reactions--skin rash, itching, hives, swelling of the face, lips, tongue, or throat Side effects that usually do not require medical attention (report to your care team if they continue or are bothersome): Bloating Gas Nausea Stomach cramping This list may not describe all possible side effects. Call your doctor for medical advice about side effects. You may report side effects to FDA at 1-800-FDA-1088. Where should I keep my medication? Keep out of the reach of children and pets. Store at room  temperature between 20 and 25 degrees C (68 and 77 degrees F). Get rid of any unused medication after the expiration date. To get rid of medications that are no longer needed or have expired: Take the medication to a medication take-back program. Check with your pharmacy or law  enforcement to find a location. If you cannot return the medication, check the label or package insert to see if the medication should be thrown out in the garbage or flushed down the toilet. If you are not sure, ask your care team. If it is safe to put it in the trash, pour the medication out of the container. Mix the medication with cat litter, dirt, coffee grounds, or other unwanted substance. Seal the mixture in a bag or container. Put it in the trash. NOTE: This sheet is a summary. It may not cover all possible information. If you have questions about this medicine, talk to your doctor, pharmacist, or health care provider.  2024 Elsevier/Gold Standard (2022-01-11 00:00:00)

## 2023-04-11 ENCOUNTER — Encounter: Payer: Self-pay | Admitting: Oncology

## 2023-04-11 ENCOUNTER — Ambulatory Visit (INDEPENDENT_AMBULATORY_CARE_PROVIDER_SITE_OTHER): Payer: Medicare HMO

## 2023-04-11 DIAGNOSIS — I351 Nonrheumatic aortic (valve) insufficiency: Secondary | ICD-10-CM | POA: Diagnosis not present

## 2023-04-11 DIAGNOSIS — R079 Chest pain, unspecified: Secondary | ICD-10-CM

## 2023-04-11 DIAGNOSIS — I340 Nonrheumatic mitral (valve) insufficiency: Secondary | ICD-10-CM

## 2023-04-11 DIAGNOSIS — I361 Nonrheumatic tricuspid (valve) insufficiency: Secondary | ICD-10-CM | POA: Diagnosis not present

## 2023-04-12 DIAGNOSIS — S52532D Colles' fracture of left radius, subsequent encounter for closed fracture with routine healing: Secondary | ICD-10-CM | POA: Diagnosis not present

## 2023-04-13 ENCOUNTER — Ambulatory Visit: Payer: Medicare HMO | Admitting: Cardiovascular Disease

## 2023-04-13 ENCOUNTER — Ambulatory Visit: Admission: RE | Admit: 2023-04-13 | Payer: Medicare HMO | Source: Ambulatory Visit

## 2023-04-20 ENCOUNTER — Other Ambulatory Visit: Payer: Self-pay

## 2023-04-20 ENCOUNTER — Emergency Department: Payer: Medicare HMO

## 2023-04-20 ENCOUNTER — Emergency Department
Admission: EM | Admit: 2023-04-20 | Discharge: 2023-04-20 | Disposition: A | Discharge status: Home / Self Care | Payer: Medicare HMO | Attending: Emergency Medicine | Admitting: Emergency Medicine

## 2023-04-20 DIAGNOSIS — K6289 Other specified diseases of anus and rectum: Secondary | ICD-10-CM | POA: Insufficient documentation

## 2023-04-20 DIAGNOSIS — I10 Essential (primary) hypertension: Secondary | ICD-10-CM | POA: Insufficient documentation

## 2023-04-20 DIAGNOSIS — K5730 Diverticulosis of large intestine without perforation or abscess without bleeding: Secondary | ICD-10-CM | POA: Diagnosis not present

## 2023-04-20 DIAGNOSIS — Z853 Personal history of malignant neoplasm of breast: Secondary | ICD-10-CM | POA: Insufficient documentation

## 2023-04-20 DIAGNOSIS — K449 Diaphragmatic hernia without obstruction or gangrene: Secondary | ICD-10-CM | POA: Diagnosis not present

## 2023-04-20 DIAGNOSIS — E039 Hypothyroidism, unspecified: Secondary | ICD-10-CM | POA: Diagnosis not present

## 2023-04-20 DIAGNOSIS — R109 Unspecified abdominal pain: Secondary | ICD-10-CM | POA: Diagnosis not present

## 2023-04-20 LAB — COMPREHENSIVE METABOLIC PANEL
ALT: 22 U/L (ref 0–44)
AST: 54 U/L — ABNORMAL HIGH (ref 15–41)
Albumin: 3.4 g/dL — ABNORMAL LOW (ref 3.5–5.0)
Alkaline Phosphatase: 60 U/L (ref 38–126)
Anion gap: 14 (ref 5–15)
BUN: 15 mg/dL (ref 8–23)
CO2: 22 mmol/L (ref 22–32)
Calcium: 9 mg/dL (ref 8.9–10.3)
Chloride: 94 mmol/L — ABNORMAL LOW (ref 98–111)
Creatinine, Ser: 1.7 mg/dL — ABNORMAL HIGH (ref 0.44–1.00)
GFR, Estimated: 31 mL/min — ABNORMAL LOW (ref >60)
Glucose, Bld: 84 mg/dL (ref 70–99)
Potassium: 3.9 mmol/L (ref 3.5–5.1)
Sodium: 130 mmol/L — ABNORMAL LOW (ref 135–145)
Total Bilirubin: 1.3 mg/dL — ABNORMAL HIGH (ref 0.3–1.2)
Total Protein: 6.2 g/dL — ABNORMAL LOW (ref 6.5–8.1)

## 2023-04-20 LAB — CBC WITH DIFFERENTIAL/PLATELET
Abs Immature Granulocytes: 0.01 10*3/uL (ref 0.00–0.07)
Basophils Absolute: 0 10*3/uL (ref 0.0–0.1)
Basophils Relative: 1 %
Eosinophils Absolute: 0.1 10*3/uL (ref 0.0–0.5)
Eosinophils Relative: 1 %
HCT: 28.3 % — ABNORMAL LOW (ref 36.0–46.0)
Hemoglobin: 9.6 g/dL — ABNORMAL LOW (ref 12.0–15.0)
Immature Granulocytes: 0 %
Lymphocytes Relative: 28 %
Lymphs Abs: 1.8 10*3/uL (ref 0.7–4.0)
MCH: 32.8 pg (ref 26.0–34.0)
MCHC: 33.9 g/dL (ref 30.0–36.0)
MCV: 96.6 fL (ref 80.0–100.0)
Monocytes Absolute: 0.4 10*3/uL (ref 0.1–1.0)
Monocytes Relative: 6 %
Neutro Abs: 4.1 10*3/uL (ref 1.7–7.7)
Neutrophils Relative %: 64 %
Platelets: 283 10*3/uL (ref 150–400)
RBC: 2.93 MIL/uL — ABNORMAL LOW (ref 3.87–5.11)
RDW: 12.9 % (ref 11.5–15.5)
WBC: 6.4 10*3/uL (ref 4.0–10.5)
nRBC: 0 % (ref 0.0–0.2)

## 2023-04-20 LAB — URINALYSIS, ROUTINE W REFLEX MICROSCOPIC
Bacteria, UA: NONE SEEN
Bilirubin Urine: NEGATIVE
Glucose, UA: NEGATIVE mg/dL
Hgb urine dipstick: NEGATIVE
Ketones, ur: NEGATIVE mg/dL
Nitrite: NEGATIVE
Protein, ur: NEGATIVE mg/dL
Specific Gravity, Urine: 1.008 (ref 1.005–1.030)
pH: 5 (ref 5.0–8.0)

## 2023-04-20 MED ORDER — IOHEXOL 300 MG/ML  SOLN
80.0000 mL | Freq: Once | INTRAMUSCULAR | Status: AC | PRN
  Administered 2023-04-20: 80 mL via INTRAVENOUS

## 2023-04-20 MED ORDER — SODIUM CHLORIDE 0.9 % IV BOLUS
500.0000 mL | Freq: Once | INTRAVENOUS | Status: AC
  Administered 2023-04-20: 500 mL via INTRAVENOUS

## 2023-04-20 MED ORDER — MORPHINE SULFATE (PF) 4 MG/ML IV SOLN
4.0000 mg | Freq: Once | INTRAVENOUS | Status: AC
  Administered 2023-04-20: 4 mg via INTRAVENOUS
  Filled 2023-04-20: qty 1

## 2023-04-20 MED ORDER — ONDANSETRON HCL 4 MG/2ML IJ SOLN
4.0000 mg | Freq: Once | INTRAMUSCULAR | Status: AC
  Administered 2023-04-20: 4 mg via INTRAVENOUS
  Filled 2023-04-20: qty 2

## 2023-04-20 MED ORDER — HYDROCODONE-ACETAMINOPHEN 5-325 MG PO TABS: 1.0000 | ORAL_TABLET | Freq: Four times a day (QID) | ORAL | 0 refills | Status: DC | PRN

## 2023-04-20 MED ORDER — LIDOCAINE 5 % EX PTCH: 1.0000 | MEDICATED_PATCH | Freq: Two times a day (BID) | CUTANEOUS | 0 refills | Status: AC

## 2023-04-20 NOTE — ED Triage Notes (Signed)
Pt comes with c/o rectal pain. Pt states this started month ago. Pt states it has gotten worse pain. Pt states pain to sit down and becoming bedridden. Pt denies any blood in stool.

## 2023-04-20 NOTE — ED Provider Notes (Signed)
[facility] Provider Note    Event Date/Time   First MD Initiated Contact with Patient 04/20/23 1451     (approximate)   History   Hemorrhoids   HPI  Abigail Ortega is a 72 y.o. female with history of IBS, clotting disorder, hypertension, MI, hemorrhoids, and hypothyroidism along with breast cancer presents emergency department with rectal pain.  Patient thought it might be hemorrhoids but her daughter looked into the area is just very red and swollen.  Does have a history of a fissure per her sister.  States she is not eating has had a lot of anorexia recently.  Also that the patient appears to be pale compared to her normal color and may be a little yellow.  Denies chest pain or shortness of breath.  No vomiting.  No fever.  However patient also is concerning of some dysuria      Physical Exam   Triage Vital Signs: ED Triage Vitals  Encounter Vitals Group     BP 04/20/23 1253 111/78     Systolic BP Percentile --      Diastolic BP Percentile --      Pulse Rate 04/20/23 1253 81     Resp 04/20/23 1253 18     Temp 04/20/23 1253 98 F (36.7 C)     Temp src --      SpO2 04/20/23 1253 99 %     Weight --      Height --      Head Circumference --      Peak Flow --      Pain Score 04/20/23 1252 10     Pain Loc --      Pain Education --      Exclude from Growth Chart --     Most recent vital signs: Vitals:   04/20/23 1253  BP: 111/78  Pulse: 81  Resp: 18  Temp: 98 F (36.7 C)  SpO2: 99%     General: Awake, no distress.   CV:  Good peripheral perfusion. regular rate and  rhythm Resp:  Normal effort. Lungs cta Abd:  No distention.  Nontender Other:  Rectal exam shows stool noted in the patient's underwear, I did remove underwear clean the anal area which is red and irritated, tender to palpation, no hemorrhoids noted, no bulging abscess noted   ED Results / Procedures / Treatments   Labs (all labs ordered are listed, but only  abnormal results are displayed) Labs Reviewed  COMPREHENSIVE METABOLIC PANEL - Abnormal; Notable for the following components:      Result Value   Sodium 130 (*)    Chloride 94 (*)    Creatinine, Ser 1.70 (*)    Total Protein 6.2 (*)    Albumin 3.4 (*)    AST 54 (*)    Total Bilirubin 1.3 (*)    GFR, Estimated 31 (*)    All other components within normal limits  CBC WITH DIFFERENTIAL/PLATELET - Abnormal; Notable for the following components:   RBC 2.93 (*)    Hemoglobin 9.6 (*)    HCT 28.3 (*)    All other components within normal limits  URINALYSIS, ROUTINE W REFLEX MICROSCOPIC - Abnormal; Notable for the following components:   Color, Urine YELLOW (*)    APPearance HAZY (*)    Leukocytes,Ua TRACE (*)    All other components within normal limits     EKG     RADIOLOGY CT abdomen pelvis for rectal abscess  PROCEDURES:   Procedures   MEDICATIONS ORDERED IN ED: Medications  morphine (PF) 4 MG/ML injection 4 mg (4 mg Intravenous Given 04/20/23 1641)  ondansetron (ZOFRAN) injection 4 mg (4 mg Intravenous Given 04/20/23 1641)  sodium chloride 0.9 % bolus 500 mL (500 mLs Intravenous New Bag/Given 04/20/23 1644)  iohexol (OMNIPAQUE) 300 MG/ML solution 80 mL (80 mLs Intravenous Contrast Given 04/20/23 1714)     IMPRESSION / MDM / ASSESSMENT AND PLAN / ED COURSE  I reviewed the triage vital signs and the nursing notes.                              Differential diagnosis includes, but is not limited to, rectal abscess, hepatic failure, AKI, failure to thrive, cellulitis  Patient's presentation is most consistent with acute illness / injury with system symptoms.   Labs and imaging ordered Patient was given morphine 4 mg IV, Zofran 4 mg IV, saline 500 mL due to history of CHF will not give a total of 1 L   Patient's labs are stable, there is appear to be in the patient's normal trend even though her hemoglobin is low it is very typical of her hemoglobin.  CT of the  abdomen pelvis IV contrast to assess for rectal abscess is still pending  Patient is more comfortable after the pain medication.  Plan at this time is to transfer care to Cruz Condon, PA-C, if CT negative discharge on antibiotics, pain medication and a lidocaine cream.  Patient still in stable condition at time of transfer   FINAL CLINICAL IMPRESSION(S) / ED DIAGNOSES   Final diagnoses:  Rectal pain     Rx / DC Orders   ED Discharge Orders     None        Note:  This document was prepared using Dragon voice recognition software and may include unintentional dictation errors.    Faythe Ghee, PA-C 04/20/23 1914    Dionne Bucy, MD 04/20/23 2027

## 2023-04-20 NOTE — ED Provider Notes (Signed)
----------------------------------------- 8:43 PM on 04/20/2023 -----------------------------------------  Blood pressure 108/72, pulse 78, temperature 97.7 F (36.5 C), resp. rate 18, SpO2 100%.  Assuming care from Greig Right, PA-C.  In short, Abigail Ortega is a 72 y.o. female with a chief complaint of Hemorrhoids .  Refer to the original H&P for additional details.  The current plan of care is to await CT results.  ____________________________________________    ED Results / Procedures / Treatments   Labs (all labs ordered are listed, but only abnormal results are displayed) Labs Reviewed  COMPREHENSIVE METABOLIC PANEL - Abnormal; Notable for the following components:      Result Value   Sodium 130 (*)    Chloride 94 (*)    Creatinine, Ser 1.70 (*)    Total Protein 6.2 (*)    Albumin 3.4 (*)    AST 54 (*)    Total Bilirubin 1.3 (*)    GFR, Estimated 31 (*)    All other components within normal limits  CBC WITH DIFFERENTIAL/PLATELET - Abnormal; Notable for the following components:   RBC 2.93 (*)    Hemoglobin 9.6 (*)    HCT 28.3 (*)    All other components within normal limits  URINALYSIS, ROUTINE W REFLEX MICROSCOPIC - Abnormal; Notable for the following components:   Color, Urine YELLOW (*)    APPearance HAZY (*)    Leukocytes,Ua TRACE (*)    All other components within normal limits    RADIOLOGY  I personally viewed and evaluated these images as part of my medical decision making, as well as reviewing the written report by the radiologist.  ED Provider Interpretation: No evidence of perianal abscess but did show asymmetric thickening of the anorectal wall, with presacral fat stranding, radiologist could not rule out malignancy.  CT ABDOMEN PELVIS W CONTRAST  Result Date: 04/20/2023 CLINICAL DATA:  rectal abscess d. Pt comes with c/o rectal pain. Pt states this started month ago. Pt states it has gotten worse pain EXAM: CT ABDOMEN AND PELVIS WITH  CONTRAST TECHNIQUE: Multidetector CT imaging of the abdomen and pelvis was performed using the standard protocol following bolus administration of intravenous contrast. RADIATION DOSE REDUCTION: This exam was performed according to the departmental dose-optimization program which includes automated exposure control, adjustment of the mA and/or kV according to patient size and/or use of iterative reconstruction technique. CONTRAST:  80mL OMNIPAQUE IOHEXOL 300 MG/ML  SOLN COMPARISON:  CT abdomen pelvis 11/16/2016 FINDINGS: Lower chest: Small hiatal hernia. Similar-appearing right middle lobe reticulations likely representing scarring. No acute abnormality. Hepatobiliary: No focal liver abnormality. No gallstones, gallbladder wall thickening, or pericholecystic fluid. No biliary dilatation. Pancreas: No focal lesion. Normal pancreatic contour. No surrounding inflammatory changes. No main pancreatic ductal dilatation. Spleen: Normal in size without focal abnormality. Adrenals/Urinary Tract: No adrenal nodule bilaterally. Bilateral kidneys enhance symmetrically. Stable slightly more prominent right renal pelvis that may be extrarenal. Fluid density lesions likely represent simple renal cysts. Simple renal cysts, in the absence of clinically indicated signs/symptoms, require no independent follow-up. Subcentimeter hypodensities are too small to characterize-no further follow-up indicated. No hydronephrosis. No hydroureter. Irregular urothelial thickening along the urinary bladder dome. On delayed imaging, there is minimal excretion of intravenous contrast from bilateral collecting systems and ureters. Stomach/Bowel: Stomach is within normal limits. No evidence of bowel wall thickening or dilatation. Under distended rectum with query slight asymmetric anorectal wall thickening (2:74). The appendix is not definitely identified with no inflammatory changes in the right lower quadrant to suggest acute appendicitis.  Vascular/Lymphatic: No abdominal aorta or iliac aneurysm. Severe atherosclerotic plaque of the aorta and its branches. Associated severe atherosclerotic plaque of the left femoral arteries. No abdominal, pelvic, or inguinal lymphadenopathy. Reproductive: Uterus and bilateral adnexa are unremarkable. Other: Trace presacral fat stranding. No intraperitoneal free fluid. No intraperitoneal free gas. No organized fluid collection. Musculoskeletal: No abdominal wall hernia or abnormality. No suspicious lytic or blastic osseous lesions. No acute displaced fracture. IMPRESSION: 1. No perirectal abscess. 2. Under distended rectum with query slight asymmetric anorectal wall thickening. Associated trace presacral fat stranding. Cannot fully exclude underlying malignancy. Recommend correlation with physical exam/direct visualization. 3. Query mild irregular urothelial thickening along the urinary bladder dome. Correlate with urinalysis for infection. Consider direct visualization for possible underlying mass. 4. Small hiatal hernia. 5. Colonic diverticulosis with no acute diverticulitis. 6. Aortic Atherosclerosis (ICD10-I70.0). Associated severe atherosclerotic plaque of the left femoral arteries. Electronically Signed   By: Tish Frederickson M.D.   On: 04/20/2023 19:59     PROCEDURES:  Critical Care performed: No  Procedures   MEDICATIONS ORDERED IN ED: Medications  morphine (PF) 4 MG/ML injection 4 mg (4 mg Intravenous Given 04/20/23 1641)  ondansetron (ZOFRAN) injection 4 mg (4 mg Intravenous Given 04/20/23 1641)  sodium chloride 0.9 % bolus 500 mL (500 mLs Intravenous New Bag/Given 04/20/23 1644)  iohexol (OMNIPAQUE) 300 MG/ML solution 80 mL (80 mLs Intravenous Contrast Given 04/20/23 1714)     IMPRESSION / MDM / ASSESSMENT AND PLAN / ED COURSE  I reviewed the triage vital signs and the nursing notes.                              Differential diagnosis includes, but is not limited to, hemmorrhoids,  anorectal abscess, anal fissure, malignancy.  Patient's presentation is most consistent with acute complicated illness / injury requiring diagnostic workup.  Ct results as described above.  I spoke with our on-call GI specialist, Dr. Timothy Lasso regarding patient's results.  He recommended that patient have outpatient follow-up for colonoscopy.  Upon a further deep dive into her chart, he saw that patient is already scheduled to have scope done with Dr. Tobi Bastos.  I will remind patient of this and encouraged her to follow-up with them.  Patient's diagnosis is consistent with rectal pain. Patient will be discharged home with prescriptions for lidocaine patches and pain medication. Patient is to follow up with Dr. Tobi Bastos as needed or otherwise directed. Patient is given ED precautions to return to the ED for any worsening or new symptoms.     FINAL CLINICAL IMPRESSION(S) / ED DIAGNOSES   Final diagnoses:  Rectal pain     Rx / DC Orders   ED Discharge Orders          Ordered    lidocaine (LIDODERM) 5 %  Every 12 hours        04/20/23 2053    HYDROcodone-acetaminophen (NORCO/VICODIN) 5-325 MG tablet  Every 6 hours PRN        04/20/23 2053             Note:  This document was prepared using Dragon voice recognition software and may include unintentional dictation errors.    Cameron Ali, PA-C 04/20/23 2054    Jene Every, MD 04/20/23 2137

## 2023-04-20 NOTE — Discharge Instructions (Signed)
Please follow-up with Dr. Tobi Bastos for your scope.  It is very important that you have this procedure done as your imaging was concerning for possible cancer.  You can take the pain medication as needed.  Please apply the lidocaine patches as needed.  You can return to the ED with any new or worsening symptoms.

## 2023-04-23 ENCOUNTER — Telehealth: Payer: Self-pay

## 2023-04-23 NOTE — Telephone Encounter (Signed)
-----   Message from Wyline Mood sent at 04/23/2023  8:34 AM EDT ----- Regarding: RE: 05/02/23 follow up Thank you Estill Bamberg please remind patient for upcoming procedures and then follow up visit with Rowe Clack ----- Message ----- From: Jaynie Collins, DO Sent: 04/20/2023   8:46 PM EDT To: Wyline Mood, MD Subject: 05/02/23 follow up                             Pt you recently saw in the clinic (9/24) was in ED with rectal pain. H/o fissure You saw her in clinic with abnormal weight loss and planned for bidirectional endoscopy 10/16. She missed CT for you on 9/27, but had one in ED. Labs were ok and ED provider did not palpate abnormality on DRE that was reported on CT (asymmetric rectal wall thickening). I recommended they continue their planned bidirectional endoscopy with you on 10/16.  I told ED to have patient call your office Monday to confirm. Thanks, Brett Canales

## 2023-04-23 NOTE — Telephone Encounter (Signed)
Called patient and advised her to continue with her procedure scheduled for 05/02/2023. She stated that the ED had mentioned it to her and she is aware of hold her blood thinner too.

## 2023-04-25 MED ORDER — NA SULFATE-K SULFATE-MG SULF 17.5-3.13-1.6 GM/177ML PO SOLN: 354.0000 mL | Freq: Once | ORAL | 0 refills | Status: AC

## 2023-04-25 NOTE — Telephone Encounter (Signed)
Called patient's sister-Ann and was finally able to speak with her. I was able to let her know what to do with her sister's blood thinner and I also let her know that her prescription was sent to her pharmacy. Ann understood and had no further questions.

## 2023-04-25 NOTE — Addendum Note (Signed)
Addended by: Adela Ports on: 04/25/2023 12:28 PM   Modules accepted: Orders

## 2023-04-27 ENCOUNTER — Ambulatory Visit
Admission: RE | Admit: 2023-04-27 | Discharge: 2023-04-27 | Disposition: A | Discharge status: Home / Self Care | Payer: Medicare HMO | Source: Ambulatory Visit | Attending: Student | Admitting: Student

## 2023-04-27 ENCOUNTER — Other Ambulatory Visit: Payer: Self-pay | Admitting: Student

## 2023-04-27 DIAGNOSIS — R609 Edema, unspecified: Secondary | ICD-10-CM | POA: Diagnosis not present

## 2023-04-27 DIAGNOSIS — S52532P Colles' fracture of left radius, subsequent encounter for closed fracture with malunion: Secondary | ICD-10-CM | POA: Diagnosis not present

## 2023-04-27 DIAGNOSIS — M1812 Unilateral primary osteoarthritis of first carpometacarpal joint, left hand: Secondary | ICD-10-CM | POA: Diagnosis not present

## 2023-04-27 DIAGNOSIS — S52572A Other intraarticular fracture of lower end of left radius, initial encounter for closed fracture: Secondary | ICD-10-CM | POA: Diagnosis not present

## 2023-05-02 ENCOUNTER — Ambulatory Visit: Admission: RE | Admit: 2023-05-02 | Payer: Medicare HMO | Source: Home / Self Care | Admitting: Gastroenterology

## 2023-05-02 SURGERY — ESOPHAGOGASTRODUODENOSCOPY (EGD) WITH PROPOFOL
Anesthesia: General

## 2023-05-03 ENCOUNTER — Ambulatory Visit: Payer: Medicare HMO | Admitting: Cardiology

## 2023-05-07 ENCOUNTER — Ambulatory Visit: Payer: Medicare HMO | Admitting: Physician Assistant

## 2023-05-08 DIAGNOSIS — M25632 Stiffness of left wrist, not elsewhere classified: Secondary | ICD-10-CM | POA: Diagnosis not present

## 2023-05-08 DIAGNOSIS — S52532D Colles' fracture of left radius, subsequent encounter for closed fracture with routine healing: Secondary | ICD-10-CM | POA: Diagnosis not present

## 2023-05-08 DIAGNOSIS — S52532A Colles' fracture of left radius, initial encounter for closed fracture: Secondary | ICD-10-CM | POA: Diagnosis not present

## 2023-05-08 DIAGNOSIS — Z4689 Encounter for fitting and adjustment of other specified devices: Secondary | ICD-10-CM | POA: Diagnosis not present

## 2023-05-08 DIAGNOSIS — S52572D Other intraarticular fracture of lower end of left radius, subsequent encounter for closed fracture with routine healing: Secondary | ICD-10-CM | POA: Diagnosis not present

## 2023-05-10 ENCOUNTER — Inpatient Hospital Stay (HOSPITAL_BASED_OUTPATIENT_CLINIC_OR_DEPARTMENT_OTHER): Payer: Medicare HMO | Admitting: Nurse Practitioner

## 2023-05-10 ENCOUNTER — Inpatient Hospital Stay: Payer: Medicare HMO

## 2023-05-10 ENCOUNTER — Encounter: Payer: Self-pay | Admitting: Nurse Practitioner

## 2023-05-10 ENCOUNTER — Inpatient Hospital Stay: Payer: Medicare HMO | Attending: Oncology

## 2023-05-10 VITALS — BP 96/85 | HR 80 | Temp 96.0°F | Wt 135.0 lb

## 2023-05-10 DIAGNOSIS — C50311 Malignant neoplasm of lower-inner quadrant of right female breast: Secondary | ICD-10-CM

## 2023-05-10 DIAGNOSIS — M810 Age-related osteoporosis without current pathological fracture: Secondary | ICD-10-CM | POA: Insufficient documentation

## 2023-05-10 DIAGNOSIS — Z170 Estrogen receptor positive status [ER+]: Secondary | ICD-10-CM

## 2023-05-10 DIAGNOSIS — D649 Anemia, unspecified: Secondary | ICD-10-CM | POA: Insufficient documentation

## 2023-05-10 DIAGNOSIS — Z853 Personal history of malignant neoplasm of breast: Secondary | ICD-10-CM | POA: Insufficient documentation

## 2023-05-10 LAB — RETIC PANEL
Immature Retic Fract: 11 % (ref 2.3–15.9)
RBC.: 3.17 MIL/uL — ABNORMAL LOW (ref 3.87–5.11)
Retic Count, Absolute: 68.8 10*3/uL (ref 19.0–186.0)
Retic Ct Pct: 2.2 % (ref 0.4–3.1)
Reticulocyte Hemoglobin: 34.4 pg (ref >27.9)

## 2023-05-10 LAB — CBC WITH DIFFERENTIAL (CANCER CENTER ONLY)
Abs Immature Granulocytes: 0.07 10*3/uL (ref 0.00–0.07)
Basophils Absolute: 0 10*3/uL (ref 0.0–0.1)
Basophils Relative: 1 %
Eosinophils Absolute: 0.1 10*3/uL (ref 0.0–0.5)
Eosinophils Relative: 1 %
HCT: 30.5 % — ABNORMAL LOW (ref 36.0–46.0)
Hemoglobin: 10.4 g/dL — ABNORMAL LOW (ref 12.0–15.0)
Immature Granulocytes: 1 %
Lymphocytes Relative: 31 %
Lymphs Abs: 1.6 10*3/uL (ref 0.7–4.0)
MCH: 32.7 pg (ref 26.0–34.0)
MCHC: 34.1 g/dL (ref 30.0–36.0)
MCV: 95.9 fL (ref 80.0–100.0)
Monocytes Absolute: 0.2 10*3/uL (ref 0.1–1.0)
Monocytes Relative: 3 %
Neutro Abs: 3.2 10*3/uL (ref 1.7–7.7)
Neutrophils Relative %: 63 %
Platelet Count: 283 10*3/uL (ref 150–400)
RBC: 3.18 MIL/uL — ABNORMAL LOW (ref 3.87–5.11)
RDW: 12.7 % (ref 11.5–15.5)
WBC Count: 5.1 10*3/uL (ref 4.0–10.5)
nRBC: 0 % (ref 0.0–0.2)

## 2023-05-10 LAB — CMP (CANCER CENTER ONLY)
ALT: 24 U/L (ref 0–44)
AST: 52 U/L — ABNORMAL HIGH (ref 15–41)
Albumin: 3.8 g/dL (ref 3.5–5.0)
Alkaline Phosphatase: 68 U/L (ref 38–126)
Anion gap: 11 (ref 5–15)
BUN: 17 mg/dL (ref 8–23)
CO2: 23 mmol/L (ref 22–32)
Calcium: 9.3 mg/dL (ref 8.9–10.3)
Chloride: 96 mmol/L — ABNORMAL LOW (ref 98–111)
Creatinine: 1.78 mg/dL — ABNORMAL HIGH (ref 0.44–1.00)
GFR, Estimated: 30 mL/min — ABNORMAL LOW (ref >60)
Glucose, Bld: 108 mg/dL — ABNORMAL HIGH (ref 70–99)
Potassium: 3.3 mmol/L — ABNORMAL LOW (ref 3.5–5.1)
Sodium: 130 mmol/L — ABNORMAL LOW (ref 135–145)
Total Bilirubin: 1 mg/dL (ref 0.3–1.2)
Total Protein: 6.7 g/dL (ref 6.5–8.1)

## 2023-05-10 LAB — IRON AND TIBC
Iron: 63 ug/dL (ref 28–170)
Saturation Ratios: 25 % (ref 10.4–31.8)
TIBC: 252 ug/dL (ref 250–450)
UIBC: 189 ug/dL

## 2023-05-10 LAB — VITAMIN D 25 HYDROXY (VIT D DEFICIENCY, FRACTURES): Vit D, 25-Hydroxy: 93.4 ng/mL (ref 30–100)

## 2023-05-10 LAB — VITAMIN B12: Vitamin B-12: 598 pg/mL (ref 180–914)

## 2023-05-10 LAB — FERRITIN: Ferritin: 299 ng/mL (ref 11–307)

## 2023-05-10 MED ORDER — ZOLEDRONIC ACID 4 MG/5ML IV CONC
3.3000 mg | Freq: Once | INTRAVENOUS | Status: AC
  Administered 2023-05-10: 3.3 mg via INTRAVENOUS
  Filled 2023-05-10: qty 4.13

## 2023-05-10 MED ORDER — SODIUM CHLORIDE 0.9 % IV SOLN
Freq: Once | INTRAVENOUS | Status: AC
  Filled 2023-05-10: qty 250

## 2023-05-10 NOTE — Progress Notes (Signed)
[facility]  [phone] [phone]) [phone]  ID: Abigail Ortega OB: [birth year]  MR#: [id number]  NUU#:[id number]  Patient Care Team: Abigail Loveless, MD as PCP - General (Internal Medicine) Abigail Ortega, Merrily Pew, MD (General Surgery) Abigail Ready, MD (General Surgery)  CHIEF COMPLAINT: Pathologic stage IIIa triple positive invasive carcinoma of the lower inner quadrant of the right breast.   INTERVAL HISTORY: Patient returns to clinic today for routine 9-month evaluation for above history of breast cancer and consideration of Zometa for her osteoporosis. She has seen in ER on 04/20/23 for rectal pain. Possible anorectal wall thickening. Mild urothelial thickening along urinary bladder dome. Diverticulosis. She was scheduled for colonoscopy and EGD with Dr. Tobi Ortega but cancelled d/t concern for drinking the prep.   She previously had a left radial fracture and was scheduled for surgery but she cancelled procedure. Repeat xray showed worsening displacement. She was casted, sent for CT. Patient and sister wished to see hand specialist and she has now seen [facility]; declined surgery and is working with hand therapy and custom bracing  She has 50 lb weight loss, unintentional. Not taking anything for constipation but says she rarely ever has a bowel movement. She previously took multiple laxatives and stool softeners including miralax prep without bowel movement. She has limited intake of fluids and food. She endorses loss of appetite.    REVIEW OF SYSTEMS:   Review of Systems  Constitutional:  Positive for chills, malaise/fatigue and weight loss. Negative for fever.  HENT:  Negative for tinnitus.   Eyes: Negative.   Respiratory: Negative.  Negative for cough and sputum production.   Cardiovascular: Negative.  Negative for chest pain and leg swelling.  Gastrointestinal:  Positive for abdominal pain and constipation. Negative for blood in stool, diarrhea, melena, nausea  and vomiting.  Genitourinary:  Negative for dysuria, flank pain and hematuria.  Musculoskeletal:  Positive for joint pain (hand). Negative for myalgias.  Skin: Negative.  Negative for itching and rash.  Neurological: Negative.  Negative for sensory change, weakness and headaches.  Endo/Heme/Allergies:  Does not bruise/bleed easily.  Psychiatric/Behavioral:  Positive for memory loss (limited historian. Sister Abigail Ortega coordinates care). The patient is not nervous/anxious and does not have insomnia.   As per HPI. Otherwise, a complete review of systems is negative.  PAST MEDICAL HISTORY: Past Medical History:  Diagnosis Date   Anxiety    Arthritis    Breast cancer (HCC) 2014   Right breast cancer - chemo, radiation and Mastectomy   CHF (congestive heart failure) (HCC) 2013   Chronic kidney disease    Clotting disorder (HCC)    blood clots   Depression    GERD (gastroesophageal reflux disease)    GI bleed    H/O heart artery stent    Headache    Heart attack (HCC) 2012   coronary stent x2 completed by Dr. Juliann Pares.   Heart attack (HCC) Apr 29, 2013   Hemorrhoids    Hypertension    Hypothyroidism    IBS (irritable bowel syndrome)    Malignant neoplasm of lower-inner quadrant of female breast (HCC) January 20, 2013.   invasive mammary cancer, minimal 0.85 cm.  Histologic grade 1.   Personal history of chemotherapy 2014   BREAST CA   Personal history of radiation therapy 2014   BREAST CA   Thyroid disease     PAST SURGICAL HISTORY: Past Surgical History:  Procedure Laterality Date   BOTOX INJECTION N/A 03/14/2017   Procedure: BOTOX  INJECTION;  Surgeon: Leafy Ro, MD;  Location: [facility] ORS;  Service: General;  Laterality: N/A;   BOTOX INJECTION N/A 05/09/2018   Procedure: BOTOX INJECTION;  Surgeon: Leafy Ro, MD;  Location: [facility] ORS;  Service: General;  Laterality: N/A;   BREAST BIOPSY Right 2014   positive   BREAST SURGERY Right 02-21-2013   right mastectomy   CARDIAC  CATHETERIZATION     COLONOSCOPY W/ BIOPSIES  09/15/2010   Colonoscopy completed by Lutricia Feil, M.D. Tubular adenoma of the ascending colon and descending colon reported up to 0.4 cm in diameter. No atypia.   COLONOSCOPY WITH PROPOFOL N/A 12/23/2016   Procedure: COLONOSCOPY WITH PROPOFOL;  Surgeon: Charlott Rakes, MD;  Location: [facility] ENDOSCOPY;  Service: Endoscopy;  Laterality: N/A;   ESOPHAGOGASTRODUODENOSCOPY N/A 12/13/2016   Procedure: ESOPHAGOGASTRODUODENOSCOPY (EGD);  Surgeon: Wyline Mood, MD;  Location: [facility] ENDOSCOPY;  Service: Endoscopy;  Laterality: N/A;   LEFT HEART CATH N/A 04/02/2020   Procedure: Left Heart Cath with Coronary Angiography;  Surgeon: Laurier Nancy, MD;  Location: [facility] INVASIVE CV LAB;  Service: Cardiovascular;  Laterality: N/A;   LEFT HEART CATH AND CORONARY ANGIOGRAPHY Left 08/29/2018   Procedure: LEFT HEART CATH AND CORONARY ANGIOGRAPHY;  Surgeon: Laurier Nancy, MD;  Location: [facility] INVASIVE CV LAB;  Service: Cardiovascular;  Laterality: Left;   MASTECTOMY Right 2014   BREAST CA   PORTA CATH INSERTION     SPHINCTEROTOMY N/A 05/09/2018   Procedure: SPHINCTEROTOMY;  Surgeon: Leafy Ro, MD;  Location: [facility] ORS;  Service: General;  Laterality: N/A;   UPPER GI ENDOSCOPY  09/15/2010     Completed by Lutricia Feil, M.D. for nausea. Normal exam reported.   VASCULAR SURGERY      FAMILY HISTORY Family History  Problem Relation Age of Onset   Breast cancer Mother 23   Cancer Father        colon     ADVANCED DIRECTIVES:   HEALTH MAINTENANCE: Social History   Tobacco Use   Smoking status: Former    Current packs/day: 0.00    Average packs/day: 1 pack/day for 20.0 years (20.0 ttl pk-yrs)    Types: Cigarettes    Start date: 69    Quit date: 2014    Years since quitting: 10.8   Smokeless tobacco: Never  Vaping Use   Vaping status: Never Used  Substance Use Topics   Alcohol use: No   Drug use: No    Colonoscopy:  PAP:  Bone density:  Lipid  panel:  Allergies  Allergen Reactions   Nausea Control [Emetrol] Nausea And Vomiting   Other     Note: burning, nausea with XR 75 mg   Effexor [Venlafaxine] Hives, Itching and Rash   Hydroxyzine Rash and Hives    Current Outpatient Medications  Medication Sig Dispense Refill   acetaminophen (TYLENOL) 650 MG CR tablet Take 1,300 mg by mouth every 8 (eight) hours as needed for pain.     albuterol (VENTOLIN HFA) 108 (90 Base) MCG/ACT inhaler Inhale into the lungs.     ALPRAZolam (XANAX) 0.25 MG tablet Take 0.25 mg by mouth 2 (two) times daily as needed for anxiety.  2   Aspirin 81 MG CAPS      atorvastatin (LIPITOR) 80 MG tablet TAKE 1 TABLET EVERY DAY 90 tablet 3   budesonide-formoterol (SYMBICORT) 160-4.5 MCG/ACT inhaler Inhale 2 puffs into the lungs.     dexlansoprazole (DEXILANT) 60 MG capsule TAKE 1 CAPSULE EVERY DAY 90 capsule 3  dicyclomine (BENTYL) 10 MG capsule TAKE 1 CAPSULE EVERY 8 HOURS FOR ABDOMINAL PAIN 270 capsule 3   furosemide (LASIX) 20 MG tablet TAKE 1 TABLET EVERY MORNING. 90 tablet 3   hydrALAZINE (APRESOLINE) 25 MG tablet Take 25 mg by mouth 3 (three) times daily.     HYDROcodone-acetaminophen (NORCO/VICODIN) 5-325 MG tablet Take 1 tablet by mouth every 6 (six) hours as needed for moderate pain. 20 tablet 0   hydrocortisone 2.5 % cream APPLY PEA SIZED TO RECTAL REGION EVERY 8 HOURS AS NEEDED FOR RELIEF 20 g 11   letrozole (FEMARA) 2.5 MG tablet Take 2.5 mg by mouth daily.     meclizine (ANTIVERT) 25 MG tablet Take 2 tablets (50 mg total) by mouth 3 (three) times daily as needed for dizziness or nausea. 30 tablet 1   traZODone (DESYREL) 100 MG tablet TAKE 1 TABLET BY MOUTH NIGHTLY AT BEDTIME AS NEEDED FOR SLEEP (Patient taking differently: Take 50 mg by mouth at bedtime.) 90 tablet 3   azelastine (ASTELIN) 0.1 % nasal spray Place 1 spray into both nostrils 2 (two) times daily as needed for rhinitis. (Patient not taking: Reported on 05/10/2023)  4   clopidogrel (PLAVIX)  75 MG tablet TAKE 1 TABLET EVERY DAY (NEED MD APPOINTMENT) (Patient not taking: Reported on 05/10/2023) 90 tablet 3   ergocalciferol (VITAMIN D2) 1.25 MG (50000 UT) capsule 50,000 Units once a week. (Patient not taking: Reported on 05/10/2023)     ferrous sulfate 325 (65 FE) MG tablet Take 1 tablet (325 mg total) by mouth daily. (Patient not taking: Reported on 05/10/2023) 90 tablet 3   fexofenadine (ALLEGRA) 60 MG tablet Take 30 mg by mouth 2 (two) times daily as needed (allergies.). (Patient not taking: Reported on 05/10/2023)     fluorometholone (FML) 0.1 % ophthalmic suspension Place 1 drop into both eyes 2 (two) times daily. (Patient not taking: Reported on 05/10/2023)     fluticasone (FLONASE) 50 MCG/ACT nasal spray BEND HEAD FORWARD AND USE 1 SPRAY IN EACH NOSTRIL EVERY DAY (Patient not taking: Reported on 05/10/2023) 32 g 3   gabapentin (NEURONTIN) 300 MG capsule TAKE 1 CAPSULE THREE TIMES DAILY (Patient not taking: Reported on 05/10/2023) 270 capsule 3   gatifloxacin (ZYMAXID) 0.5 % SOLN Place 1 drop into both eyes daily. (Patient not taking: Reported on 05/10/2023)     hydrocortisone (ANUSOL-HC) 2.5 % rectal cream Place 1 Application rectally 2 (two) times daily. (Patient not taking: Reported on 05/10/2023) 90 g 1   hydrocortisone (ANUSOL-HC) 2.5 % rectal cream Place 1 Application rectally 3 (three) times daily. (Patient not taking: Reported on 05/10/2023)     isosorbide mononitrate (IMDUR) 120 MG 24 hr tablet TAKE 1 TABLET TWICE DAILY (Patient not taking: Reported on 05/10/2023) 180 tablet 3   ketorolac (ACULAR) 0.5 % ophthalmic solution Place 1 drop into the left eye 4 (four) times daily. (Patient not taking: Reported on 05/10/2023)     levocetirizine (XYZAL) 5 MG tablet Take 5 mg by mouth every evening. (Patient not taking: Reported on 05/10/2023)     levothyroxine (SYNTHROID) 88 MCG tablet TAKE 1 TABLET EVERY MORNING 1 HOUR PRIOR TO OTHER MEDS OR FOOD ON AN EMPTY STOMACH (Patient not  taking: Reported on 05/10/2023) 90 tablet 3   lidocaine (LIDODERM) 5 % Place 1 patch onto the skin every 12 (twelve) hours. Remove & Discard patch within 12 hours or as directed by MD (Patient not taking: Reported on 05/10/2023) 10 patch 0   Magnesium 250 MG  TABS Take by mouth. (Patient not taking: Reported on 05/10/2023)     midodrine (PROAMATINE) 5 MG tablet Take 5 mg by mouth 3 (three) times daily. (Patient not taking: Reported on 05/10/2023)     montelukast (SINGULAIR) 10 MG tablet TAKE 1 TABLET AT BEDTIME (Patient not taking: Reported on 05/10/2023) 90 tablet 3   omeprazole (PRILOSEC) 40 MG capsule Take 1 capsule (40 mg total) by mouth daily. (Patient not taking: Reported on 05/10/2023) 90 capsule 3   ondansetron (ZOFRAN) 4 MG tablet  (Patient not taking: Reported on 05/10/2023)     Probiotic Product (4X PROBIOTIC) TABS 4X Probiotic Oral Tablet QTY: 0 tablet Days: 0 Refills: 0  Written: 03/28/18 Patient Instructions: qd (Patient not taking: Reported on 05/10/2023)     promethazine (PHENERGAN) 12.5 MG tablet Take 12.5 mg by mouth every 4 (four) hours as needed. (Patient not taking: Reported on 05/10/2023)     ranolazine (RANEXA) 500 MG 12 hr tablet TAKE 1 TABLET TWICE DAILY (Patient not taking: Reported on 05/10/2023) 180 tablet 3   Rimegepant Sulfate (NURTEC) 75 MG TBDP Nurtec 75 MG Oral Tablet Disintegrating QTY: 8 tablet Days: 30 Refills: 2  Written: 07/01/20 Patient Instructions: Take 1 tablet by mouth x 1 dose at time of headache, do not take an additional tablet until 24 hours later if needed (Patient not taking: Reported on 05/10/2023)     temazepam (RESTORIL) 30 MG capsule Take 30 mg by mouth at bedtime. (Patient not taking: Reported on 05/10/2023)     No current facility-administered medications for this visit.   Facility-Administered Medications Ordered in Other Visits  Medication Dose Route Frequency Provider Last Rate Last Admin   sodium chloride flush (NS) 0.9 % injection 3 mL  3 mL  Intravenous Q12H Adrian Blackwater A, MD        OBJECTIVE: Vitals:   05/10/23 1344  BP: 96/85  Pulse: 80  Temp: (!) 96 F (35.6 C)  SpO2: 99%     Body mass index is 21.79 kg/m.    ECOG FS:0 - Asymptomatic  General: Thin build. Frail appearing. Wheelchair. Accompanied by sister Eyes: Pink conjunctiva, anicteric sclera. Lungs: Diminished bilaterally Heart: Regular rate and rhythm.  Abdomen: Soft, nontender, nondistended.  Musculoskeletal: No edema, cyanosis, or clubbing. Neuro: Alert, answering all questions appropriately.  Skin: No rashes or petechiae noted. Psych: Normal affect.   LAB RESULTS:  Lab Results  Component Value Date   NA 130 (L) 05/10/2023   K 3.3 (L) 05/10/2023   CL 96 (L) 05/10/2023   CO2 23 05/10/2023   GLUCOSE 108 (H) 05/10/2023   BUN 17 05/10/2023   CREATININE 1.78 (H) 05/10/2023   CALCIUM 9.3 05/10/2023   PROT 6.7 05/10/2023   ALBUMIN 3.8 05/10/2023   AST 52 (H) 05/10/2023   ALT 24 05/10/2023   ALKPHOS 68 05/10/2023   BILITOT 1.0 05/10/2023   GFRNONAA 30 (L) 05/10/2023   GFRAA 45 (L) 04/02/2020    Lab Results  Component Value Date   WBC 5.1 05/10/2023   NEUTROABS 3.2 05/10/2023   HGB 10.4 (L) 05/10/2023   HCT 30.5 (L) 05/10/2023   MCV 95.9 05/10/2023   PLT 283 05/10/2023     STUDIES: CT WRIST LEFT WO CONTRAST  Result Date: 04/27/2023 CLINICAL DATA:  Colles fracture, injury 6 weeks ago. EXAM: CT OF THE LEFT WRIST WITHOUT CONTRAST TECHNIQUE: Multidetector CT imaging was performed according to the standard protocol. Multiplanar CT image reconstructions were also generated. RADIATION DOSE REDUCTION: This exam was performed  according to the departmental dose-optimization program which includes automated exposure control, adjustment of the mA and/or kV according to patient size and/or use of iterative reconstruction technique. COMPARISON:  Radiographs 02/26/2023 FINDINGS: Bones/Joint/Cartilage Comminuted distal radial fracture noted with  transverse metaphyseal component, notable intra-articular extension, and dorsal and lateral displacement of various radial distal radial articular surface fragments with up to 8 mm bony overlap dorsally and 16 mm of bony overlap laterally. No overt cortication along the fracture planes. There is a volar-ulnar sided fragment of the distal radius which is not substantially displaced on image 35 of series 7, but which demonstrates increased sclerosis, which could be an indicator of a vascular necrosis of this fragment. Apex dorsal angulation of most of the distal radial fragments with dorsal displacement of the carpus similar to a dorsal Barton fracture. Chronic transverse fracture of the ulnar styloid, with corticated margins. Abnormally widened scapholunate interval at 4 mm, compatible with scapholunate ligament tear. There is potentially some early proximal migration of the capitate for example on image 29 series 6, possibly setting up for SLAC wrist. Advanced degenerative arthritis at the first carpometacarpal articulation. Ligaments Suboptimally assessed by CT. Widened scapholunate interval implying scapholunate ligament tear. Muscles and Tendons Grossly unremarkable Soft tissues Mild subcutaneous edema in the vicinity of the fracture. Fiberglass cast noted. IMPRESSION: 1. Comminuted distal radial fracture with intra-articular extension and dorsal and lateral displacement of various radial articular surface fragments which are overlapped by the proximal shaft fragment. There is a volar-ulnar sided fragment of the distal radius which is not substantially displaced, but which demonstrates increased sclerosis, which could be an indicator of a vascular necrosis of this fragment. 2. Chronic transverse fracture of the ulnar styloid. 3. Widened scapholunate interval at 4 mm, compatible with scapholunate ligament tear. There is potentially some early proximal migration of the capitate, possibly setting up for SLAC wrist.  4. Advanced degenerative arthritis at the first carpometacarpal articulation. Electronically Signed   By: Gaylyn Rong M.D.   On: 04/27/2023 17:42   CT ABDOMEN PELVIS W CONTRAST  Result Date: 04/20/2023 CLINICAL DATA:  rectal abscess d. Pt comes with c/o rectal pain. Pt states this started month ago. Pt states it has gotten worse pain EXAM: CT ABDOMEN AND PELVIS WITH CONTRAST TECHNIQUE: Multidetector CT imaging of the abdomen and pelvis was performed using the standard protocol following bolus administration of intravenous contrast. RADIATION DOSE REDUCTION: This exam was performed according to the departmental dose-optimization program which includes automated exposure control, adjustment of the mA and/or kV according to patient size and/or use of iterative reconstruction technique. CONTRAST:  80mL OMNIPAQUE IOHEXOL 300 MG/ML  SOLN COMPARISON:  CT abdomen pelvis 11/16/2016 FINDINGS: Lower chest: Small hiatal hernia. Similar-appearing right middle lobe reticulations likely representing scarring. No acute abnormality. Hepatobiliary: No focal liver abnormality. No gallstones, gallbladder wall thickening, or pericholecystic fluid. No biliary dilatation. Pancreas: No focal lesion. Normal pancreatic contour. No surrounding inflammatory changes. No main pancreatic ductal dilatation. Spleen: Normal in size without focal abnormality. Adrenals/Urinary Tract: No adrenal nodule bilaterally. Bilateral kidneys enhance symmetrically. Stable slightly more prominent right renal pelvis that may be extrarenal. Fluid density lesions likely represent simple renal cysts. Simple renal cysts, in the absence of clinically indicated signs/symptoms, require no independent follow-up. Subcentimeter hypodensities are too small to characterize-no further follow-up indicated. No hydronephrosis. No hydroureter. Irregular urothelial thickening along the urinary bladder dome. On delayed imaging, there is minimal excretion of intravenous  contrast from bilateral collecting systems and ureters. Stomach/Bowel: Stomach is within  normal limits. No evidence of bowel wall thickening or dilatation. Under distended rectum with query slight asymmetric anorectal wall thickening (2:74). The appendix is not definitely identified with no inflammatory changes in the right lower quadrant to suggest acute appendicitis. Vascular/Lymphatic: No abdominal aorta or iliac aneurysm. Severe atherosclerotic plaque of the aorta and its branches. Associated severe atherosclerotic plaque of the left femoral arteries. No abdominal, pelvic, or inguinal lymphadenopathy. Reproductive: Uterus and bilateral adnexa are unremarkable. Other: Trace presacral fat stranding. No intraperitoneal free fluid. No intraperitoneal free gas. No organized fluid collection. Musculoskeletal: No abdominal wall hernia or abnormality. No suspicious lytic or blastic osseous lesions. No acute displaced fracture. IMPRESSION: 1. No perirectal abscess. 2. Under distended rectum with query slight asymmetric anorectal wall thickening. Associated trace presacral fat stranding. Cannot fully exclude underlying malignancy. Recommend correlation with physical exam/direct visualization. 3. Query mild irregular urothelial thickening along the urinary bladder dome. Correlate with urinalysis for infection. Consider direct visualization for possible underlying mass. 4. Small hiatal hernia. 5. Colonic diverticulosis with no acute diverticulitis. 6. Aortic Atherosclerosis (ICD10-I70.0). Associated severe atherosclerotic plaque of the left femoral arteries. Electronically Signed   By: Tish Frederickson M.D.   On: 04/20/2023 19:59   PCV ECHOCARDIOGRAM COMPLETE  Result Date: 04/12/2023 Images from the original result were not included. Reason for Visit  INDICATIONS:  R07.9 Echocardiogram: An echocardiogram in (2-d) mode was performed and in Doppler mode with color flow velocity mapping was performed. ventricular septum  thickness 1.1 cm, L ventricular posterior wall thickness (diastole) 1.2 cm, left atrium size 2.4 cm, right atrium 6.0 cm, aortic root diameter 3.0 cm, L ventricle diastolic dimension 4.3 cm, L ventricle systolic dimension 3.7, L ventricle ejection fraction 30 %, and LV fractional shortening 20 % L ventricular outflow tract internal diameter 1.8 cm, L ventricular outflow tract flow velocity .70 m/s, aortic valve cusps 1.7 cm , aortic valve flow velocity 1.0 (m/sec), aortic valve systolic calculated mean flow gradient 2 mmHg, mitral valve diastolic peak flow velocity E .33 m/sec, and mitral valve diastolic peak flow E/A ratio 0.4 Mitral valve has mild regurgitation Aortic valve has  trace regurgitation Tricuspid valve has mild regurgitation ASSESSMENT Technically adequate study. Normal chamber sizes. Moderately decreased left ventricular systolic function. Mild left ventricular hypertrophy with GRADE 1  (relaxation abnormality) diastolic dysfunction. Normal right ventricular systolic function. Normal right ventricular diastolic function. Normal left ventricular wall motion. Normal right ventricular wall motion. Trace to Mild tricuspid regurgitation. Normal pulmonary artery pressure. Trace to Mild mitral regurgitation. Trace aortic regurgitation. No pericardial effusion. Mild left ventricle hypertrophy    ASSESSMENT: Pathologic stage IIIa triple positive invasive carcinoma of the lower inner quadrant of the right breast.   PLAN:    1. Pathologic stage IIIa triple positive invasive carcinoma of the lower inner quadrant of the right breast:  She only completed 14 of 18 infusions of Herceptin secondary to persistent nausea.  Patient completed over 7 years of letrozole and has now discontinued treatment.  Her CA 27-29 has been normal and is no longer followed. Her most recent mammogram on January 27, 2021 was reported as BI-RADS 1.  Mammograms now scheduled by PCP. Next due 02/2023. She has not seen pcp recently and I  encouraged mammogram. This can be coordinated by pcp in future if she wishes.   2. Osteoporosis: Bone mineral density from January 27, 2021 revealed a T score of -2.9 which is slightly worse than the year prior.  Patient could not tolerate Fosamax and Prolia was  denied by insurance. Repeat bone density in 01/2022 was reported with T score -3.3. Worse. Labs reviewed. Continue zometa. Continue calcium and vitamin D supplementation.  Repeat bone mineral density in July 2024. Labs reviewed. Proceed with zometa, dose adjusted to 3.3 d/t CKD, today.  Return to clinic in 6 months with repeat laboratory work, further evaluation, and continuation of treatment.   3.  CKD: Chronic. Worsening. eGFR now 30. I recommended follow up with nephrology and increased fluid intake. May consider holding zometa if ongoing Cr Cl < 35.   4.  Hyponatremia: Chronic and unchanged.  Patient's sodium is 130.    5. Anemia- hmg 10.4. Likely anemia of CKD. She is taking oral iron which may be contributing to constipation. Hold IV iron for now. Getting close to hmg < 10 in which case she would become candidate for EPO injections.   6. Hypocalcemia- Ca 9.3 today. Normal albumin. Continue calcium 1200 mg daily along with vitamin d 1000 iu daily. Vitamin D level pending  7. Constipation- possible rectal mass on imaging. I reviewed that CT and symptoms concerning for malignancy and I recommend colonoscopy. She is in agreement and will reach out to Dr. Johnney Killian office to get rescheduled.   8. Unintentional weight loss- 50 lb unintentional weight loss. Recent ct a/p. She wishes to hold off on ct chest for now but if ongoing weight loss and no apparent etiology on colonoscopy, recommend imaging.   9. Urothelial thickening- incidental on 04/20/23 imaging. She's asymptomatic. At next visit, consider UA vs ref to urology for cystoscopy.   Disposition:  Zometa today 1 month- lab (cbc, cmp, UA), Dr. Orlie Dakin.   Patient expressed understanding and  was in agreement with this plan. She also understands that She can call clinic at any time with any questions, concerns, or complaints.   Breast cancer of lower-inner quadrant of right female breast   Staging form: Breast, AJCC 7th Edition     Pathologic stage from 02/24/2015: Stage IIIA (T1c, N2b, cM0) - Signed by Jeralyn Ruths, MD on 02/24/2015   Abigail Dooms, NP 05/10/23   CC: Meryl Crutch, NP

## 2023-05-10 NOTE — Patient Instructions (Signed)

## 2023-05-11 LAB — ERYTHROPOIETIN: Erythropoietin: 9.5 m[IU]/mL (ref 2.6–18.5)

## 2023-05-16 NOTE — Telephone Encounter (Signed)
Called patient to reschedule her colonoscopy and she requested for me to call her sister Dewayne Hatch. I told her that I had called her yesterday and she stated to try again. I then called patient's sisterDewayne Hatch and had to leave her a detailed message again to call me back so we could schedule her sister's colonoscopy.

## 2023-05-16 NOTE — Telephone Encounter (Signed)
Called Ann-patient's sister as recommended by Consuello Masse, PA but had to leave her a voicemail to call me back so we could reschedule his sister's colonoscopy.

## 2023-05-17 NOTE — Telephone Encounter (Signed)
Called patient's sister-Anna again and she did not answer so I left her a voicemail to call me back. I will also mail her a letter to reach out to [place] to reschedule her colonoscopy.

## 2023-05-21 ENCOUNTER — Other Ambulatory Visit: Payer: Self-pay

## 2023-05-21 DIAGNOSIS — Z79899 Other long term (current) drug therapy: Secondary | ICD-10-CM | POA: Diagnosis not present

## 2023-05-21 DIAGNOSIS — M25562 Pain in left knee: Secondary | ICD-10-CM | POA: Diagnosis not present

## 2023-05-21 DIAGNOSIS — I700 Atherosclerosis of aorta: Secondary | ICD-10-CM | POA: Diagnosis not present

## 2023-05-21 DIAGNOSIS — R911 Solitary pulmonary nodule: Secondary | ICD-10-CM | POA: Insufficient documentation

## 2023-05-21 DIAGNOSIS — N390 Urinary tract infection, site not specified: Secondary | ICD-10-CM | POA: Insufficient documentation

## 2023-05-21 DIAGNOSIS — C50911 Malignant neoplasm of unspecified site of right female breast: Secondary | ICD-10-CM | POA: Diagnosis not present

## 2023-05-21 DIAGNOSIS — Z7982 Long term (current) use of aspirin: Secondary | ICD-10-CM | POA: Diagnosis not present

## 2023-05-21 DIAGNOSIS — I509 Heart failure, unspecified: Secondary | ICD-10-CM | POA: Diagnosis not present

## 2023-05-21 DIAGNOSIS — I130 Hypertensive heart and chronic kidney disease with heart failure and stage 1 through stage 4 chronic kidney disease, or unspecified chronic kidney disease: Secondary | ICD-10-CM | POA: Insufficient documentation

## 2023-05-21 DIAGNOSIS — E039 Hypothyroidism, unspecified: Secondary | ICD-10-CM | POA: Diagnosis not present

## 2023-05-21 DIAGNOSIS — M1712 Unilateral primary osteoarthritis, left knee: Secondary | ICD-10-CM | POA: Diagnosis not present

## 2023-05-21 DIAGNOSIS — R319 Hematuria, unspecified: Secondary | ICD-10-CM | POA: Diagnosis present

## 2023-05-21 DIAGNOSIS — R0602 Shortness of breath: Secondary | ICD-10-CM | POA: Insufficient documentation

## 2023-05-21 DIAGNOSIS — R079 Chest pain, unspecified: Secondary | ICD-10-CM | POA: Diagnosis not present

## 2023-05-21 DIAGNOSIS — C169 Malignant neoplasm of stomach, unspecified: Secondary | ICD-10-CM | POA: Diagnosis not present

## 2023-05-21 DIAGNOSIS — N189 Chronic kidney disease, unspecified: Secondary | ICD-10-CM | POA: Insufficient documentation

## 2023-05-21 DIAGNOSIS — J439 Emphysema, unspecified: Secondary | ICD-10-CM | POA: Diagnosis not present

## 2023-05-21 DIAGNOSIS — R918 Other nonspecific abnormal finding of lung field: Secondary | ICD-10-CM | POA: Diagnosis not present

## 2023-05-21 DIAGNOSIS — R0789 Other chest pain: Secondary | ICD-10-CM | POA: Diagnosis not present

## 2023-05-21 LAB — BASIC METABOLIC PANEL
Anion gap: 12 (ref 5–15)
BUN: 17 mg/dL (ref 8–23)
CO2: 24 mmol/L (ref 22–32)
Calcium: 8.7 mg/dL — ABNORMAL LOW (ref 8.9–10.3)
Chloride: 99 mmol/L (ref 98–111)
Creatinine, Ser: 1.71 mg/dL — ABNORMAL HIGH (ref 0.44–1.00)
GFR, Estimated: 31 mL/min — ABNORMAL LOW (ref >60)
Glucose, Bld: 105 mg/dL — ABNORMAL HIGH (ref 70–99)
Potassium: 3.1 mmol/L — ABNORMAL LOW (ref 3.5–5.1)
Sodium: 135 mmol/L (ref 135–145)

## 2023-05-21 LAB — CBC
HCT: 32.9 % — ABNORMAL LOW (ref 36.0–46.0)
Hemoglobin: 11 g/dL — ABNORMAL LOW (ref 12.0–15.0)
MCH: 32.4 pg (ref 26.0–34.0)
MCHC: 33.4 g/dL (ref 30.0–36.0)
MCV: 97.1 fL (ref 80.0–100.0)
Platelets: 334 10*3/uL (ref 150–400)
RBC: 3.39 MIL/uL — ABNORMAL LOW (ref 3.87–5.11)
RDW: 13.2 % (ref 11.5–15.5)
WBC: 6.1 10*3/uL (ref 4.0–10.5)
nRBC: 0 % (ref 0.0–0.2)

## 2023-05-21 NOTE — ED Notes (Signed)
Per pt son pt is unable to stand and would need a bedpan to give [place] a urine sample. Pt does not able to void at the moment. Pt back in waiting room and this tech informed pt's son that when pt feels like she had to void to let one of the nursing staff know so we can help her.

## 2023-05-21 NOTE — Telephone Encounter (Signed)
You welcome 

## 2023-05-21 NOTE — ED Triage Notes (Signed)
Patient reports blood in urine; was recently diagnosed with stomach cancer, also having poor po intake.

## 2023-05-22 ENCOUNTER — Other Ambulatory Visit: Payer: Self-pay

## 2023-05-22 ENCOUNTER — Emergency Department: Payer: Medicare HMO

## 2023-05-22 ENCOUNTER — Emergency Department
Admission: EM | Admit: 2023-05-22 | Discharge: 2023-05-22 | Disposition: A | Discharge status: Home / Self Care | Payer: Medicare HMO | Attending: Emergency Medicine | Admitting: Emergency Medicine

## 2023-05-22 ENCOUNTER — Encounter: Payer: Self-pay | Admitting: Emergency Medicine

## 2023-05-22 DIAGNOSIS — C50911 Malignant neoplasm of unspecified site of right female breast: Secondary | ICD-10-CM | POA: Diagnosis not present

## 2023-05-22 DIAGNOSIS — J439 Emphysema, unspecified: Secondary | ICD-10-CM | POA: Diagnosis not present

## 2023-05-22 DIAGNOSIS — M25562 Pain in left knee: Secondary | ICD-10-CM | POA: Diagnosis not present

## 2023-05-22 DIAGNOSIS — M1712 Unilateral primary osteoarthritis, left knee: Secondary | ICD-10-CM | POA: Diagnosis not present

## 2023-05-22 DIAGNOSIS — C169 Malignant neoplasm of stomach, unspecified: Secondary | ICD-10-CM | POA: Diagnosis not present

## 2023-05-22 DIAGNOSIS — N390 Urinary tract infection, site not specified: Secondary | ICD-10-CM

## 2023-05-22 DIAGNOSIS — I700 Atherosclerosis of aorta: Secondary | ICD-10-CM | POA: Diagnosis not present

## 2023-05-22 DIAGNOSIS — R079 Chest pain, unspecified: Secondary | ICD-10-CM | POA: Diagnosis not present

## 2023-05-22 DIAGNOSIS — R918 Other nonspecific abnormal finding of lung field: Secondary | ICD-10-CM | POA: Diagnosis not present

## 2023-05-22 DIAGNOSIS — R0602 Shortness of breath: Secondary | ICD-10-CM | POA: Diagnosis not present

## 2023-05-22 DIAGNOSIS — R911 Solitary pulmonary nodule: Secondary | ICD-10-CM

## 2023-05-22 HISTORY — DX: Malignant neoplasm of stomach, unspecified: C16.9

## 2023-05-22 LAB — MAGNESIUM: Magnesium: 1.9 mg/dL (ref 1.7–2.4)

## 2023-05-22 LAB — HEPATIC FUNCTION PANEL
ALT: 17 U/L (ref 0–44)
AST: 33 U/L (ref 15–41)
Albumin: 3.8 g/dL (ref 3.5–5.0)
Alkaline Phosphatase: 71 U/L (ref 38–126)
Bilirubin, Direct: 0.3 mg/dL — ABNORMAL HIGH (ref 0.0–0.2)
Indirect Bilirubin: 1.3 mg/dL — ABNORMAL HIGH (ref 0.3–0.9)
Total Bilirubin: 1.6 mg/dL — ABNORMAL HIGH (ref <1.2)
Total Protein: 7.1 g/dL (ref 6.5–8.1)

## 2023-05-22 LAB — URINALYSIS, ROUTINE W REFLEX MICROSCOPIC
Bilirubin Urine: NEGATIVE
Glucose, UA: NEGATIVE mg/dL
Hgb urine dipstick: NEGATIVE
Ketones, ur: NEGATIVE mg/dL
Nitrite: NEGATIVE
Protein, ur: 100 mg/dL — AB
Specific Gravity, Urine: 1.04 — ABNORMAL HIGH (ref 1.005–1.030)
pH: 5 (ref 5.0–8.0)

## 2023-05-22 LAB — PROTIME-INR
INR: 1 (ref 0.8–1.2)
Prothrombin Time: 13.8 s (ref 11.4–15.2)

## 2023-05-22 LAB — LIPASE, BLOOD: Lipase: 35 U/L (ref 11–51)

## 2023-05-22 LAB — TROPONIN I (HIGH SENSITIVITY)
Troponin I (High Sensitivity): 21 ng/L — ABNORMAL HIGH (ref <18)
Troponin I (High Sensitivity): 20 ng/L — ABNORMAL HIGH (ref <18)

## 2023-05-22 MED ORDER — IOHEXOL 350 MG/ML SOLN
65.0000 mL | Freq: Once | INTRAVENOUS | Status: AC | PRN
  Administered 2023-05-22: 65 mL via INTRAVENOUS

## 2023-05-22 MED ORDER — SODIUM CHLORIDE 0.9 % IV BOLUS (SEPSIS)
1000.0000 mL | Freq: Once | INTRAVENOUS | Status: AC
  Administered 2023-05-22: 1000 mL via INTRAVENOUS

## 2023-05-22 MED ORDER — PANTOPRAZOLE SODIUM 40 MG PO TBEC: 40.0000 mg | DELAYED_RELEASE_TABLET | Freq: Every day | ORAL | 1 refills | Status: AC

## 2023-05-22 MED ORDER — ONDANSETRON HCL 4 MG/2ML IJ SOLN
4.0000 mg | Freq: Once | INTRAMUSCULAR | Status: AC
  Administered 2023-05-22: 4 mg via INTRAVENOUS
  Filled 2023-05-22: qty 2

## 2023-05-22 MED ORDER — ONDANSETRON 4 MG PO TBDP: 4.0000 mg | ORAL_TABLET | Freq: Four times a day (QID) | ORAL | 0 refills | Status: AC | PRN

## 2023-05-22 MED ORDER — CEPHALEXIN 500 MG PO CAPS: 500.0000 mg | ORAL_CAPSULE | Freq: Two times a day (BID) | ORAL | 0 refills | Status: DC

## 2023-05-22 MED ORDER — PANTOPRAZOLE SODIUM 40 MG IV SOLR
40.0000 mg | Freq: Once | INTRAVENOUS | Status: AC
  Administered 2023-05-22: 40 mg via INTRAVENOUS
  Filled 2023-05-22: qty 10

## 2023-05-22 MED ORDER — CEPHALEXIN 500 MG PO CAPS
500.0000 mg | ORAL_CAPSULE | Freq: Once | ORAL | Status: AC
  Administered 2023-05-22: 500 mg via ORAL
  Filled 2023-05-22: qty 1

## 2023-05-22 MED ORDER — OXYCODONE HCL 5 MG PO TABS
5.0000 mg | ORAL_TABLET | Freq: Once | ORAL | Status: AC
  Administered 2023-05-22: 5 mg via ORAL
  Filled 2023-05-22: qty 1

## 2023-05-22 MED ORDER — POTASSIUM CHLORIDE CRYS ER 20 MEQ PO TBCR
40.0000 meq | EXTENDED_RELEASE_TABLET | Freq: Once | ORAL | Status: AC
  Administered 2023-05-22: 40 meq via ORAL
  Filled 2023-05-22: qty 2

## 2023-05-22 NOTE — ED Notes (Signed)
This tech obtained a EKG on pt per RN request. Pt visitor stated that she will be going home to rest and will come back in the morning.  RN informed.

## 2023-05-22 NOTE — Discharge Instructions (Signed)
Your labs were reassuring today.  CT scan of your chest, abdomen and pelvis showed no acute abnormality other than a pulmonary nodule in your right middle lung which needs to be followed by your oncologist as an outpatient.  Your workup today showed no sign of dehydration.  Your urine showed possible mild infection which we are sending you home on antibiotics for.  You need to follow-up with gastroenterology as recommended by your outpatient doctors for further workup of why you are having difficulty eating and drinking.

## 2023-05-22 NOTE — ED Notes (Signed)
Pt was not able to provide a urine sample, unable to urinate at this time, will attempt again after IV fluids

## 2023-05-22 NOTE — ED Notes (Signed)
Patient transported to CT 

## 2023-05-22 NOTE — ED Provider Notes (Signed)
[facility] Provider Note    Event Date/Time   First MD Initiated Contact with Patient 05/22/23 0031     (approximate)   History   Hematuria   HPI  Abigail Ortega is a 72 y.o. female with history of breast cancer, CAD, hypertension, CHF, hypothyroidism who presents to the emergency department multiple complaints.  Reports over the past few weeks she has had no appetite and when she does eat she has nausea and vomiting.  She states she feels dehydrated.  She is only able to take small sips of water and eat Jell-O.  She reports history of stomach cancer (although unclear based on records) and states that she is not getting treatment.  States she completed mastectomy, chemotherapy and radiation for right-sided breast cancer previously.  She denies any diarrhea.  She is also complaining of chest pain and shortness of breath.  She has history of CAD with stents, CHF and "clotting disorder".  It is unclear if she has had a PE or DVT.  She is on aspirin but no anticoagulant.  Also complaining of left knee pain.  Denies any injury to the knee.  States that it gave out on her today when trying to get into the bed.  She did not fall to the ground or hit her head.  Patient also complaining of gross hematuria.  Has seen this intermittently this week.  No dysuria.  No vaginal bleeding or discharge.  No bloody stool or melena.   History provided by patient, sister.    Past Medical History:  Diagnosis Date   Anxiety    Arthritis    Breast cancer (HCC) 2014   Right breast cancer - chemo, radiation and Mastectomy   CHF (congestive heart failure) (HCC) 2013   Chronic kidney disease    Clotting disorder (HCC)    blood clots   Depression    GERD (gastroesophageal reflux disease)    GI bleed    H/O heart artery stent    Headache    Heart attack (HCC) 2012   coronary stent x2 completed by Dr. Juliann Pares.   Heart attack (HCC) 04/29/2013   Hemorrhoids     Hypertension    Hypothyroidism    IBS (irritable bowel syndrome)    Malignant neoplasm of lower-inner quadrant of female breast (HCC) 01/20/2013   invasive mammary cancer, minimal 0.85 cm.  Histologic grade 1.   Personal history of chemotherapy 2014   BREAST CA   Personal history of radiation therapy 2014   BREAST CA   Thyroid disease     Past Surgical History:  Procedure Laterality Date   BOTOX INJECTION N/A 03/14/2017   Procedure: BOTOX INJECTION;  Surgeon: Leafy Ro, MD;  Location: [facility] ORS;  Service: General;  Laterality: N/A;   BOTOX INJECTION N/A 05/09/2018   Procedure: BOTOX INJECTION;  Surgeon: Leafy Ro, MD;  Location: [facility] ORS;  Service: General;  Laterality: N/A;   BREAST BIOPSY Right 2014   positive   BREAST SURGERY Right 02-21-2013   right mastectomy   CARDIAC CATHETERIZATION     COLONOSCOPY W/ BIOPSIES  09/15/2010   Colonoscopy completed by Lutricia Feil, M.D. Tubular adenoma of the ascending colon and descending colon reported up to 0.4 cm in diameter. No atypia.   COLONOSCOPY WITH PROPOFOL N/A 12/23/2016   Procedure: COLONOSCOPY WITH PROPOFOL;  Surgeon: Charlott Rakes, MD;  Location: [facility] ENDOSCOPY;  Service: Endoscopy;  Laterality: N/A;   ESOPHAGOGASTRODUODENOSCOPY N/A 12/13/2016   Procedure: ESOPHAGOGASTRODUODENOSCOPY (  EGD);  Surgeon: Wyline Mood, MD;  Location: [facility] ENDOSCOPY;  Service: Endoscopy;  Laterality: N/A;   LEFT HEART CATH N/A 04/02/2020   Procedure: Left Heart Cath with Coronary Angiography;  Surgeon: Laurier Nancy, MD;  Location: [facility] INVASIVE CV LAB;  Service: Cardiovascular;  Laterality: N/A;   LEFT HEART CATH AND CORONARY ANGIOGRAPHY Left 08/29/2018   Procedure: LEFT HEART CATH AND CORONARY ANGIOGRAPHY;  Surgeon: Laurier Nancy, MD;  Location: [facility] INVASIVE CV LAB;  Service: Cardiovascular;  Laterality: Left;   MASTECTOMY Right 2014   BREAST CA   PORTA CATH INSERTION     SPHINCTEROTOMY N/A 05/09/2018   Procedure: SPHINCTEROTOMY;  Surgeon:  Leafy Ro, MD;  Location: [facility] ORS;  Service: General;  Laterality: N/A;   UPPER GI ENDOSCOPY  09/15/2010     Completed by Lutricia Feil, M.D. for nausea. Normal exam reported.   VASCULAR SURGERY      MEDICATIONS:  Prior to Admission medications   Medication Sig Start Date End Date Taking? Authorizing Provider  acetaminophen (TYLENOL) 650 MG CR tablet Take 1,300 mg by mouth every 8 (eight) hours as needed for pain.    [provider]  albuterol (VENTOLIN HFA) 108 (90 Base) MCG/ACT inhaler Inhale into the lungs. 08/27/21   [provider]  ALPRAZolam Prudy Feeler) 0.25 MG tablet Take 0.25 mg by mouth 2 (two) times daily as needed for anxiety. 04/23/15   [provider]  Aspirin 81 MG CAPS  08/08/18   [provider]  atorvastatin (LIPITOR) 80 MG tablet TAKE 1 TABLET EVERY DAY 02/12/23   Adrian Blackwater A, MD  azelastine (ASTELIN) 0.1 % nasal spray Place 1 spray into both nostrils 2 (two) times daily as needed for rhinitis. Patient not taking: Reported on 05/10/2023 04/10/17   [provider]  budesonide-formoterol (SYMBICORT) 160-4.5 MCG/ACT inhaler Inhale 2 puffs into the lungs. 12/16/19   [provider]  clopidogrel (PLAVIX) 75 MG tablet TAKE 1 TABLET EVERY DAY (NEED MD APPOINTMENT) Patient not taking: Reported on 05/10/2023 02/12/23   Laurier Nancy, MD  dexlansoprazole (DEXILANT) 60 MG capsule TAKE 1 CAPSULE EVERY DAY 04/18/22   Wyline Mood, MD  dicyclomine (BENTYL) 10 MG capsule TAKE 1 CAPSULE EVERY 8 HOURS FOR ABDOMINAL PAIN 11/06/22   Orson Eva, NP  ergocalciferol (VITAMIN D2) 1.25 MG (50000 UT) capsule 50,000 Units once a week. Patient not taking: Reported on 05/10/2023 11/07/19   [provider]  ferrous sulfate 325 (65 FE) MG tablet Take 1 tablet (325 mg total) by mouth daily. Patient not taking: Reported on 05/10/2023 11/28/22 11/28/23  Orson Eva, NP  fexofenadine (ALLEGRA) 60 MG tablet Take 30 mg by mouth 2 (two) times daily  as needed (allergies.). Patient not taking: Reported on 05/10/2023    [provider]  fluorometholone (FML) 0.1 % ophthalmic suspension Place 1 drop into both eyes 2 (two) times daily. Patient not taking: Reported on 05/10/2023 04/01/20   [provider]  fluticasone (FLONASE) 50 MCG/ACT nasal spray BEND HEAD FORWARD AND USE 1 SPRAY IN EACH NOSTRIL EVERY DAY Patient not taking: Reported on 05/10/2023 11/30/22   Orson Eva, NP  furosemide (LASIX) 20 MG tablet TAKE 1 TABLET EVERY MORNING. 11/30/22   Orson Eva, NP  gabapentin (NEURONTIN) 300 MG capsule TAKE 1 CAPSULE THREE TIMES DAILY Patient not taking: Reported on 05/10/2023 09/06/22   Orson Eva, NP  gatifloxacin (ZYMAXID) 0.5 % SOLN Place 1 drop into both eyes daily. Patient not taking: Reported on 05/10/2023 02/26/23  [provider]  hydrALAZINE (APRESOLINE) 25 MG tablet Take 25 mg by mouth 3 (three) times daily.    [provider]  HYDROcodone-acetaminophen (NORCO/VICODIN) 5-325 MG tablet Take 1 tablet by mouth every 6 (six) hours as needed for moderate pain. 04/20/23 04/19/24  Cameron Ali, PA-C  hydrocortisone (ANUSOL-HC) 2.5 % rectal cream Place 1 Application rectally 2 (two) times daily. Patient not taking: Reported on 05/10/2023 11/28/22   Orson Eva, NP  hydrocortisone (ANUSOL-HC) 2.5 % rectal cream Place 1 Application rectally 3 (three) times daily. Patient not taking: Reported on 05/10/2023 11/13/18   [provider]  hydrocortisone 2.5 % cream APPLY PEA SIZED TO RECTAL REGION EVERY 8 HOURS AS NEEDED FOR RELIEF 11/06/22   Orson Eva, NP  isosorbide mononitrate (IMDUR) 120 MG 24 hr tablet TAKE 1 TABLET TWICE DAILY Patient not taking: Reported on 05/10/2023 02/12/23   Laurier Nancy, MD  ketorolac (ACULAR) 0.5 % ophthalmic solution Place 1 drop into the left eye 4 (four) times daily. Patient not taking: Reported on 05/10/2023 02/26/23   [provider]   letrozole (FEMARA) 2.5 MG tablet Take 2.5 mg by mouth daily. 02/07/16   [provider]  levocetirizine (XYZAL) 5 MG tablet Take 5 mg by mouth every evening. Patient not taking: Reported on 05/10/2023 06/30/20   [provider]  levothyroxine (SYNTHROID) 88 MCG tablet TAKE 1 TABLET EVERY MORNING 1 HOUR PRIOR TO OTHER MEDS OR FOOD ON AN EMPTY STOMACH Patient not taking: Reported on 05/10/2023 10/30/22   Orson Eva, NP  lidocaine (LIDODERM) 5 % Place 1 patch onto the skin every 12 (twelve) hours. Remove & Discard patch within 12 hours or as directed by MD Patient not taking: Reported on 05/10/2023 04/20/23 04/19/24  Cameron Ali, PA-C  Magnesium 250 MG TABS Take by mouth. Patient not taking: Reported on 05/10/2023    [provider]  meclizine (ANTIVERT) 25 MG tablet Take 2 tablets (50 mg total) by mouth 3 (three) times daily as needed for dizziness or nausea. 09/28/17   Sharman Cheek, MD  midodrine (PROAMATINE) 5 MG tablet Take 5 mg by mouth 3 (three) times daily. Patient not taking: Reported on 05/10/2023    [provider]  montelukast (SINGULAIR) 10 MG tablet TAKE 1 TABLET AT BEDTIME Patient not taking: Reported on 05/10/2023 01/17/23   Miki Kins, FNP  omeprazole (PRILOSEC) 40 MG capsule Take 1 capsule (40 mg total) by mouth daily. Patient not taking: Reported on 05/10/2023 04/10/23   Wyline Mood, MD  ondansetron [place]) 4 MG tablet  02/27/19   [provider]  Probiotic Product (4X PROBIOTIC) TABS 4X Probiotic Oral Tablet QTY: 0 tablet Days: 0 Refills: 0  Written: 03/28/18 Patient Instructions: qd Patient not taking: Reported on 05/10/2023 03/28/18   [provider]  promethazine (PHENERGAN) 12.5 MG tablet Take 12.5 mg by mouth every 4 (four) hours as needed. Patient not taking: Reported on 05/10/2023    [provider]  ranolazine (RANEXA) 500 MG 12 hr tablet TAKE 1 TABLET TWICE DAILY Patient not taking:  Reported on 05/10/2023 01/17/23   Laurier Nancy, MD  Rimegepant Sulfate (NURTEC) 75 MG TBDP Nurtec 75 MG Oral Tablet Disintegrating QTY: 8 tablet Days: 30 Refills: 2  Written: 07/01/20 Patient Instructions: Take 1 tablet by mouth x 1 dose at time of headache, do not take an additional tablet until 24 hours later if needed Patient not taking: Reported on 05/10/2023 07/01/20   [provider]  temazepam (  RESTORIL) 30 MG capsule Take 30 mg by mouth at bedtime. Patient not taking: Reported on 05/10/2023 02/04/17   [provider]  traZODone (DESYREL) 100 MG tablet TAKE 1 TABLET BY MOUTH NIGHTLY AT BEDTIME AS NEEDED FOR SLEEP Patient taking differently: Take 50 mg by mouth at bedtime. 09/06/22   Orson Eva, NP    Physical Exam   Triage Vital Signs: ED Triage Vitals  Encounter Vitals Group     BP 05/21/23 1844 110/77     Systolic BP Percentile --      Diastolic BP Percentile --      Pulse Rate 05/21/23 1844 85     Resp 05/21/23 1844 18     Temp 05/21/23 1844 98.8 F (37.1 C)     Temp Source 05/21/23 1844 Oral     SpO2 05/21/23 1844 96 %     Weight --      Height --      Head Circumference --      Peak Flow --      Pain Score 05/21/23 1842 0     Pain Loc --      Pain Education --      Exclude from Growth Chart --     Most recent vital signs: Vitals:   05/22/23 0330 05/22/23 0345  BP:    Pulse: 72 67  Resp: 16 13  Temp:    SpO2: 98% 99%    CONSTITUTIONAL: Alert, responds appropriately to questions.  Elderly, thin, chronically ill-appearing HEAD: Normocephalic, atraumatic EYES: Conjunctivae clear, pupils appear equal, sclera nonicteric ENT: normal nose; moist appearing mucous membranes NECK: Supple, normal ROM CARD: RRR; S1 and S2 appreciated RESP: Normal chest excursion without splinting or tachypnea; breath sounds clear and equal bilaterally; no wheezes, no rhonchi, no rales, no hypoxia or respiratory distress, speaking full sentences ABD/GI:  Non-distended; soft, non-tender, no rebound, no guarding, no peritoneal signs BACK: The back appears normal EXT: Tender over the left knee without redness or warmth.  Mild joint effusion.  Able to fully extend the knee but not able to flex it due to pain.  No calf tenderness or calf swelling.  2+ DP pulses bilaterally.  Compartments soft. SKIN: Normal color for age and race; warm; no rash on exposed skin NEURO: Moves all extremities equally, normal speech PSYCH: The patient's mood and manner are appropriate.   ED Results / Procedures / Treatments   LABS: (all labs ordered are listed, but only abnormal results are displayed) Labs Reviewed  BASIC METABOLIC PANEL - Abnormal; Notable for the following components:      Result Value   Potassium 3.1 (*)    Glucose, Bld 105 (*)    Creatinine, Ser 1.71 (*)    Calcium 8.7 (*)    GFR, Estimated 31 (*)    All other components within normal limits  CBC - Abnormal; Notable for the following components:   RBC 3.39 (*)    Hemoglobin 11.0 (*)    HCT 32.9 (*)    All other components within normal limits  URINALYSIS, ROUTINE W REFLEX MICROSCOPIC - Abnormal; Notable for the following components:   Color, Urine AMBER (*)    APPearance HAZY (*)    Specific Gravity, Urine 1.040 (*)    Protein, ur 100 (*)    Leukocytes,Ua SMALL (*)    Bacteria, UA RARE (*)    All other components within normal limits  HEPATIC FUNCTION PANEL - Abnormal; Notable for the following components:   Total Bilirubin  1.6 (*)    Bilirubin, Direct 0.3 (*)    Indirect Bilirubin 1.3 (*)    All other components within normal limits  TROPONIN I (HIGH SENSITIVITY) - Abnormal; Notable for the following components:   Troponin I (High Sensitivity) 21 (*)    All other components within normal limits  TROPONIN I (HIGH SENSITIVITY) - Abnormal; Notable for the following components:   Troponin I (High Sensitivity) 20 (*)    All other components within normal limits  URINE CULTURE   PROTIME-INR  MAGNESIUM  LIPASE, BLOOD     EKG:  EKG Interpretation Date/Time:    Ventricular Rate:    PR Interval:    QRS Duration:    QT Interval:    QTC Calculation:   R Axis:      Text Interpretation:           RADIOLOGY: My personal review and interpretation of imaging: CT shows no PE or other acute abnormality in the abdomen pelvis.  X-ray of the left knee unremarkable.  I have personally reviewed all radiology reports.   CT Angio Chest PE W and/or Wo Contrast  Result Date: 05/22/2023 CLINICAL DATA:  Newly diagnosed gastric cancer, decreased p.o. intake, hematuria. History of right breast cancer. EXAM: CT ANGIOGRAPHY CHEST CT ABDOMEN AND PELVIS WITH CONTRAST TECHNIQUE: Multidetector CT imaging of the chest was performed using the standard protocol during bolus administration of intravenous contrast. Multiplanar CT image reconstructions and MIPs were obtained to evaluate the vascular anatomy. Multidetector CT imaging of the abdomen and pelvis was performed using the standard protocol during bolus administration of intravenous contrast. RADIATION DOSE REDUCTION: This exam was performed according to the departmental dose-optimization program which includes automated exposure control, adjustment of the mA and/or kV according to patient size and/or use of iterative reconstruction technique. CONTRAST:  65mL OMNIPAQUE IOHEXOL 350 MG/ML SOLN COMPARISON:  CT abdomen/pelvis dated 04/20/2023. CT chest dated 12/27/2022. FINDINGS: CTA CHEST FINDINGS Cardiovascular: Satisfactory opacification the bilateral pulmonary arteries to the segmental level. No evidence of pulmonary embolism. Study is not tailored for evaluation of the thoracic aorta. No evidence of thoracic aortic aneurysm. Atherosclerotic calcifications of the aortic arch. The heart is normal in size.  No pericardial effusion. Moderate three-vessel coronary atherosclerosis. Mediastinum/Nodes: No suspicious mediastinal lymphadenopathy.  Visualized thyroid is unremarkable. Lungs/Pleura: Biapical pleural-parenchymal scarring. Radiation changes in the anterior right upper and middle lobes. New 13 mm subpleural nodule in the right middle lobe (series 4/image 81). Stable 4 mm nodule in the left upper lobe (series 4/image 75). Mild centrilobular and paraseptal emphysematous changes, upper lung predominant. No focal consolidation. No pleural effusion or pneumothorax. Musculoskeletal: Status post right mastectomy. Visualized osseous structures are within normal limits. Review of the MIP images confirms the above findings. CT ABDOMEN and PELVIS FINDINGS Hepatobiliary: Liver is within normal limits. Gallbladder is within normal limits. No intrahepatic or extrahepatic ductal dilatation. Pancreas: Within normal limits. Spleen: Within normal limits. Adrenals/Urinary Tract: Adrenal glands are within normal limits. Two left renal cysts measuring up to 10 mm (series 4/image 31), measuring simple fluid density, benign (Bosniak I). No follow-up is recommended. Right kidney is within normal limits in no hydronephrosis. Bladder is within normal limits. Stomach/Bowel: Small hiatal hernia. Known gastric cancer is not evident on CT. No evidence bowel obstruction. Suspected prior appendectomy. Sigmoid diverticulosis, without diverticulitis. Vascular/Lymphatic: No evidence of abdominal aortic aneurysm. Atherosclerotic calcifications of the abdominal aorta and branch vessels, although vessels remain patent. No suspicious abdominopelvic lymphadenopathy. Reproductive: Uterus is within normal limits. Bilateral  ovaries are within normal limits. Other: No abdominopelvic ascites. Musculoskeletal: Visualized osseous structures are within normal limits. Review of the MIP images confirms the above findings. IMPRESSION: No evidence of pulmonary embolism. Known gastric cancer is not evident on CT. Status post right mastectomy. Radiation changes in the right hemithorax. New 13 mm  subpleural nodule in the right middle lobe, nonspecific. However, metastatic disease is difficult to exclude. Consider follow-up PET-CT as an outpatient. No evidence of metastatic disease in the abdomen/pelvis. Aortic Atherosclerosis (ICD10-I70.0) and Emphysema (ICD10-J43.9). Electronically Signed   By: Charline Bills M.D.   On: 05/22/2023 02:07   CT ABDOMEN PELVIS W CONTRAST  Result Date: 05/22/2023 CLINICAL DATA:  Newly diagnosed gastric cancer, decreased p.o. intake, hematuria. History of right breast cancer. EXAM: CT ANGIOGRAPHY CHEST CT ABDOMEN AND PELVIS WITH CONTRAST TECHNIQUE: Multidetector CT imaging of the chest was performed using the standard protocol during bolus administration of intravenous contrast. Multiplanar CT image reconstructions and MIPs were obtained to evaluate the vascular anatomy. Multidetector CT imaging of the abdomen and pelvis was performed using the standard protocol during bolus administration of intravenous contrast. RADIATION DOSE REDUCTION: This exam was performed according to the departmental dose-optimization program which includes automated exposure control, adjustment of the mA and/or kV according to patient size and/or use of iterative reconstruction technique. CONTRAST:  65mL OMNIPAQUE IOHEXOL 350 MG/ML SOLN COMPARISON:  CT abdomen/pelvis dated 04/20/2023. CT chest dated 12/27/2022. FINDINGS: CTA CHEST FINDINGS Cardiovascular: Satisfactory opacification the bilateral pulmonary arteries to the segmental level. No evidence of pulmonary embolism. Study is not tailored for evaluation of the thoracic aorta. No evidence of thoracic aortic aneurysm. Atherosclerotic calcifications of the aortic arch. The heart is normal in size.  No pericardial effusion. Moderate three-vessel coronary atherosclerosis. Mediastinum/Nodes: No suspicious mediastinal lymphadenopathy. Visualized thyroid is unremarkable. Lungs/Pleura: Biapical pleural-parenchymal scarring. Radiation changes in the  anterior right upper and middle lobes. New 13 mm subpleural nodule in the right middle lobe (series 4/image 81). Stable 4 mm nodule in the left upper lobe (series 4/image 75). Mild centrilobular and paraseptal emphysematous changes, upper lung predominant. No focal consolidation. No pleural effusion or pneumothorax. Musculoskeletal: Status post right mastectomy. Visualized osseous structures are within normal limits. Review of the MIP images confirms the above findings. CT ABDOMEN and PELVIS FINDINGS Hepatobiliary: Liver is within normal limits. Gallbladder is within normal limits. No intrahepatic or extrahepatic ductal dilatation. Pancreas: Within normal limits. Spleen: Within normal limits. Adrenals/Urinary Tract: Adrenal glands are within normal limits. Two left renal cysts measuring up to 10 mm (series 4/image 31), measuring simple fluid density, benign (Bosniak I). No follow-up is recommended. Right kidney is within normal limits in no hydronephrosis. Bladder is within normal limits. Stomach/Bowel: Small hiatal hernia. Known gastric cancer is not evident on CT. No evidence bowel obstruction. Suspected prior appendectomy. Sigmoid diverticulosis, without diverticulitis. Vascular/Lymphatic: No evidence of abdominal aortic aneurysm. Atherosclerotic calcifications of the abdominal aorta and branch vessels, although vessels remain patent. No suspicious abdominopelvic lymphadenopathy. Reproductive: Uterus is within normal limits. Bilateral ovaries are within normal limits. Other: No abdominopelvic ascites. Musculoskeletal: Visualized osseous structures are within normal limits. Review of the MIP images confirms the above findings. IMPRESSION: No evidence of pulmonary embolism. Known gastric cancer is not evident on CT. Status post right mastectomy. Radiation changes in the right hemithorax. New 13 mm subpleural nodule in the right middle lobe, nonspecific. However, metastatic disease is difficult to exclude. Consider  follow-up PET-CT as an outpatient. No evidence of metastatic disease in the abdomen/pelvis. Aortic  Atherosclerosis (ICD10-I70.0) and Emphysema (ICD10-J43.9). Electronically Signed   By: Charline Bills M.D.   On: 05/22/2023 02:07   DG Knee Left Port  Result Date: 05/22/2023 CLINICAL DATA:  Left knee pain, no known injury EXAM: PORTABLE LEFT KNEE - 1-2 VIEW COMPARISON:  05/11/2013 FINDINGS: Near complete joint space loss and spurring in the patellofemoral compartment. Intra-articular loose body in the intercondylar notch. No acute fracture, subluxation or dislocation. Vascular calcifications noted. IMPRESSION: No acute bony abnormality. Moderate osteoarthritis. Electronically Signed   By: Charlett Nose M.D.   On: 05/22/2023 01:40   DG Chest Portable 1 View  Result Date: 05/22/2023 CLINICAL DATA:  Chest pain, shortness of breath EXAM: PORTABLE CHEST 1 VIEW COMPARISON:  09/29/2022, CT 12/27/2022 FINDINGS: Right apical scarring, stable since prior CT. No acute confluent opacities or effusions. Heart mediastinal contours within normal limits. Aortic atherosclerosis. No acute bony abnormality. IMPRESSION: No active disease. Electronically Signed   By: Charlett Nose M.D.   On: 05/22/2023 01:38     PROCEDURES:  Critical Care performed: No      .1-3 Lead EKG Interpretation  Performed by: Zenith Kercheval, Layla Maw, DO Authorized by: Charnell Peplinski, Layla Maw, DO     Interpretation: normal     ECG rate:  73   ECG rate assessment: normal     Rhythm: sinus rhythm     Ectopy: none     Conduction: normal       IMPRESSION / MDM / ASSESSMENT AND PLAN / ED COURSE  I reviewed the triage vital signs and the nursing notes.    Patient here with multiple complaints.  Complaining of nausea, vomiting, decreased appetite, chest pain, shortness of breath, left knee pain.  The patient is on the cardiac monitor to evaluate for evidence of arrhythmia and/or significant heart rate changes.   DIFFERENTIAL DIAGNOSIS (includes  but not limited to):   Dehydration, electrolyte derangement, anemia, worsening malignancy, bowel obstruction, UTI, kidney stone, pyelonephritis, ACS, PE, pneumonia, CHF, knee fracture, effusion, gout, doubt septic arthritis   Patient's presentation is most consistent with acute presentation with potential threat to life or bodily function.   PLAN: Will obtain labs, urine, left knee x-ray, CTA of the chest, CT of the abdomen pelvis.  Will give IV fluids, pain and nausea medicine.   MEDICATIONS GIVEN IN ED: Medications  cephALEXin (KEFLEX) capsule 500 mg (has no administration in time range)  sodium chloride 0.9 % bolus 1,000 mL (0 mLs Intravenous Stopped 05/22/23 0301)  ondansetron (ZOFRAN) injection 4 mg (4 mg Intravenous Given 05/22/23 0100)  potassium chloride SA (KLOR-CON M) CR tablet 40 mEq (40 mEq Oral Given 05/22/23 0106)  iohexol (OMNIPAQUE) 350 MG/ML injection 65 mL (65 mLs Intravenous Contrast Given 05/22/23 0137)  oxyCODONE (Oxy IR/ROXICODONE) immediate release tablet 5 mg (5 mg Oral Given 05/22/23 0222)  pantoprazole (PROTONIX) injection 40 mg (40 mg Intravenous Given 05/22/23 0321)     ED COURSE: Patient's labs show potassium of 3.1.  Will give oral replacement.  Stable chronic kidney disease.  Normal magnesium level.  Normal LFTs.  Normal lipase.  Troponin x 2 flat.  CT scans reviewed and interpreted by myself and the radiologist and show no acute abnormality.  No PE, pneumonia.  She has a right middle lobe pulmonary nodule.  Recommended follow-up for this.  Had lengthy discussion with patient and sister about patient stating that she has stomach cancer.  I cannot find anywhere in her notes from oncology or GI that this has ever been confirmed.  Patient tells me that she just assumed she had stomach cancer because of her history of breast cancer and the fact that she has not been able to eat and drink without discomfort for several months.  It looks like she was scheduled to see GI as  an outpatient and have an endoscopy but states she did not want to proceed with the EGD so she canceled the appointment.   Patient's urine shows some white blood cells, red blood cells and bacteria but also many squamous cells.  This may be a contaminated sample.  Culture is pending.  Given she reports seeing gross hematuria at home, will start her on Keflex.  I definitely do not think this is what is causing her months-long symptoms of difficulty eating and drinking.  She also has no ketones in her urine and again no other signs of dehydration today.  I feel she is safe to be discharged.  Will discharge with Protonix and recommended that she follow-up with her PCP, oncologist and gastroenterology.  Patient and sister comfortable with this plan.  X-ray of the left knee reviewed and interpreted by myself and the radiologist and shows no acute abnormality.  She has been able to ambulate with a walker which is her baseline.  She has also been able to tolerate oral potassium here without difficulty and no vomiting.  At this time, I do not feel there is any life-threatening condition present. I reviewed all nursing notes, vitals, pertinent previous records.  All lab and urine results, EKGs, imaging ordered have been independently reviewed and interpreted by myself.  I reviewed all available radiology reports from any imaging ordered this visit.  Based on my assessment, I feel the patient is safe to be discharged home without further emergent workup and can continue workup as an outpatient as needed. Discussed all findings, treatment plan as well as usual and customary return precautions.  They verbalize understanding and are comfortable with this plan.  Outpatient follow-up has been provided as needed.  All questions have been answered.   CONSULTS:  none   OUTSIDE RECORDS REVIEWED: Reviewed recent oncology and GI notes.       FINAL CLINICAL IMPRESSION(S) / ED DIAGNOSES   Final diagnoses:  Chest  pain, unspecified type  Acute pain of left knee  Right middle lobe pulmonary nodule  Acute UTI     Rx / DC Orders   ED Discharge Orders          Ordered    cephALEXin (KEFLEX) 500 MG capsule  2 times daily        05/22/23 0414    pantoprazole (PROTONIX) 40 MG tablet  Daily        05/22/23 0414    ondansetron (ZOFRAN-ODT) 4 MG disintegrating tablet  Every 6 hours PRN        05/22/23 0414             Note:  This document was prepared using Dragon voice recognition software and may include unintentional dictation errors.   Keneisha Heckart, Layla Maw, DO 05/22/23 [phone]

## 2023-05-22 NOTE — ED Notes (Signed)
Pt given [place] sandwich, chips, and apple sauce and coke to drink for PO challenge

## 2023-05-23 LAB — URINE CULTURE

## 2023-05-24 ENCOUNTER — Ambulatory Visit (INDEPENDENT_AMBULATORY_CARE_PROVIDER_SITE_OTHER): Payer: Medicare HMO | Admitting: Cardiology

## 2023-05-24 ENCOUNTER — Encounter: Payer: Self-pay | Admitting: Cardiology

## 2023-05-24 VITALS — BP 99/78 | HR 78 | Ht 65.0 in | Wt 138.0 lb

## 2023-05-24 DIAGNOSIS — I10 Essential (primary) hypertension: Secondary | ICD-10-CM | POA: Diagnosis not present

## 2023-05-24 DIAGNOSIS — E782 Mixed hyperlipidemia: Secondary | ICD-10-CM | POA: Diagnosis not present

## 2023-05-24 DIAGNOSIS — K219 Gastro-esophageal reflux disease without esophagitis: Secondary | ICD-10-CM | POA: Diagnosis not present

## 2023-05-24 MED ORDER — FUROSEMIDE 20 MG PO TABS: 20.0000 mg | ORAL_TABLET | Freq: Every day | ORAL | 3 refills | Status: DC | PRN

## 2023-05-24 MED ORDER — LEVOTHYROXINE SODIUM 88 MCG PO TABS: 88.0000 ug | ORAL_TABLET | Freq: Every day | ORAL | 3 refills | Status: DC

## 2023-05-24 MED ORDER — FEXOFENADINE HCL 60 MG PO TABS: 30.0000 mg | ORAL_TABLET | Freq: Two times a day (BID) | ORAL | 3 refills | Status: AC | PRN

## 2023-05-24 MED ORDER — TRAZODONE HCL 100 MG PO TABS: 100.0000 mg | ORAL_TABLET | Freq: Every day | ORAL | 3 refills | Status: DC

## 2023-05-24 NOTE — Progress Notes (Signed)
Established Patient Office Visit  Subjective:  Patient ID: Abigail Ortega, female    DOB: 30-Mar-1951  Age: 72 y.o. MRN: [id number]  Chief Complaint  Patient presents with   Follow-up    Patient in office for hospital follow up. Patient doing well, no complaints today. Reviewed patient's medication list in detail. Patient unsure what she is to suppose to be taking. Discussed medications and each one's purpose in detail. New list of medications given to patient. Refills sent to the pharmacy.     No other concerns at this time.   Past Medical History:  Diagnosis Date   Anxiety    Arthritis    Breast cancer (HCC) 2014   Right breast cancer - chemo, radiation and Mastectomy   CHF (congestive heart failure) (HCC) 2013   Chronic kidney disease    Clotting disorder (HCC)    blood clots   Depression    GERD (gastroesophageal reflux disease)    GI bleed    H/O heart artery stent    Headache    Heart attack (HCC) 2012   coronary stent x2 completed by Dr. Juliann Pares.   Heart attack (HCC) 04/29/2013   Hemorrhoids    Hypertension    Hypothyroidism    IBS (irritable bowel syndrome)    Malignant neoplasm of lower-inner quadrant of female breast (HCC) 01/20/2013   invasive mammary cancer, minimal 0.85 cm.  Histologic grade 1.   Personal history of chemotherapy 2014   BREAST CA   Personal history of radiation therapy 2014   BREAST CA   Thyroid disease     Past Surgical History:  Procedure Laterality Date   BOTOX INJECTION N/A 03/14/2017   Procedure: BOTOX INJECTION;  Surgeon: Leafy Ro, MD;  Location: [facility] ORS;  Service: General;  Laterality: N/A;   BOTOX INJECTION N/A 05/09/2018   Procedure: BOTOX INJECTION;  Surgeon: Leafy Ro, MD;  Location: [facility] ORS;  Service: General;  Laterality: N/A;   BREAST BIOPSY Right 2014   positive   BREAST SURGERY Right 02-21-2013   right mastectomy   CARDIAC CATHETERIZATION     COLONOSCOPY W/ BIOPSIES  09/15/2010   Colonoscopy  completed by Lutricia Feil, M.D. Tubular adenoma of the ascending colon and descending colon reported up to 0.4 cm in diameter. No atypia.   COLONOSCOPY WITH PROPOFOL N/A 12/23/2016   Procedure: COLONOSCOPY WITH PROPOFOL;  Surgeon: Charlott Rakes, MD;  Location: [facility] ENDOSCOPY;  Service: Endoscopy;  Laterality: N/A;   ESOPHAGOGASTRODUODENOSCOPY N/A 12/13/2016   Procedure: ESOPHAGOGASTRODUODENOSCOPY (EGD);  Surgeon: Wyline Mood, MD;  Location: [facility] ENDOSCOPY;  Service: Endoscopy;  Laterality: N/A;   LEFT HEART CATH N/A 04/02/2020   Procedure: Left Heart Cath with Coronary Angiography;  Surgeon: Laurier Nancy, MD;  Location: [facility] INVASIVE CV LAB;  Service: Cardiovascular;  Laterality: N/A;   LEFT HEART CATH AND CORONARY ANGIOGRAPHY Left 08/29/2018   Procedure: LEFT HEART CATH AND CORONARY ANGIOGRAPHY;  Surgeon: Laurier Nancy, MD;  Location: [facility] INVASIVE CV LAB;  Service: Cardiovascular;  Laterality: Left;   MASTECTOMY Right 2014   BREAST CA   PORTA CATH INSERTION     SPHINCTEROTOMY N/A 05/09/2018   Procedure: SPHINCTEROTOMY;  Surgeon: Leafy Ro, MD;  Location: [facility] ORS;  Service: General;  Laterality: N/A;   UPPER GI ENDOSCOPY  09/15/2010     Completed by Lutricia Feil, M.D. for nausea. Normal exam reported.   VASCULAR SURGERY      Social History   Socioeconomic History   Marital status:  Divorced    Spouse name: Not on file   Number of children: Not on file   Years of education: Not on file   Highest education level: Not on file  Occupational History   Not on file  Tobacco Use   Smoking status: Former    Current packs/day: 0.00    Average packs/day: 1 pack/day for 20.0 years (20.0 ttl pk-yrs)    Types: Cigarettes    Start date: 61    Quit date: 2014    Years since quitting: 10.8   Smokeless tobacco: Never  Vaping Use   Vaping status: Never Used  Substance and Sexual Activity   Alcohol use: No   Drug use: No   Sexual activity: Not Currently  Other Topics Concern   Not on file   Social History Narrative   Not on file   Social Determinants of Health   Financial Resource Strain: Not on file  Food Insecurity: Not on file  Transportation Needs: Not on file  Physical Activity: Not on file  Stress: Not on file  Social Connections: Not on file  Intimate Partner Violence: Not on file    Family History  Problem Relation Age of Onset   Breast cancer Mother 26   Cancer Father        colon    Allergies  Allergen Reactions   Nausea Control [Emetrol] Nausea And Vomiting   Other     Note: burning, nausea with XR 75 mg   Effexor [Venlafaxine] Hives, Itching and Rash   Hydroxyzine Rash and Hives    Review of Systems  Constitutional: Negative.   HENT: Negative.    Eyes: Negative.   Respiratory: Negative.  Negative for shortness of breath.   Cardiovascular: Negative.  Negative for chest pain.  Gastrointestinal: Negative.  Negative for abdominal pain, constipation and diarrhea.  Genitourinary: Negative.   Musculoskeletal:  Negative for joint pain and myalgias.  Skin: Negative.   Neurological: Negative.  Negative for dizziness and headaches.  Endo/Heme/Allergies: Negative.   All other systems reviewed and are negative.      Objective:   BP 99/78   Pulse 78   Ht 5\' 5"  (1.651 m)   Wt 138 lb (62.6 kg)   SpO2 97%   BMI 22.96 kg/m   Vitals:   05/24/23 1404  BP: 99/78  Pulse: 78  Height: 5\' 5"  (1.651 m)  Weight: 138 lb (62.6 kg)  SpO2: 97%  BMI (Calculated): 22.96    Physical Exam Vitals and nursing note reviewed.  Constitutional:      Appearance: Normal appearance. She is normal weight.  HENT:     Head: Normocephalic and atraumatic.     Nose: Nose normal.     Mouth/Throat:     Mouth: Mucous membranes are moist.  Eyes:     Extraocular Movements: Extraocular movements intact.     Conjunctiva/sclera: Conjunctivae normal.     Pupils: Pupils are equal, round, and reactive to light.  Cardiovascular:     Rate and Rhythm: Normal rate and  regular rhythm.     Pulses: Normal pulses.     Heart sounds: Normal heart sounds.  Pulmonary:     Effort: Pulmonary effort is normal.     Breath sounds: Normal breath sounds.  Abdominal:     General: Abdomen is flat. Bowel sounds are normal.     Palpations: Abdomen is soft.  Musculoskeletal:        General: Normal range of motion.  Cervical back: Normal range of motion.  Skin:    General: Skin is warm and dry.  Neurological:     General: No focal deficit present.     Mental Status: She is alert and oriented to person, place, and time.  Psychiatric:        Mood and Affect: Mood normal.        Behavior: Behavior normal.        Thought Content: Thought content normal.        Judgment: Judgment normal.      No results found for any visits on 05/24/23.  Recent Results (from the past 2160 hour(s))  Urinalysis, Routine w reflex microscopic -Urine, Clean Catch     Status: Abnormal   Collection Time: 04/20/23  3:32 PM  Result Value Ref Range   Color, Urine YELLOW (A) YELLOW   APPearance HAZY (A) CLEAR   Specific Gravity, Urine 1.008 1.005 - 1.030   pH 5.0 5.0 - 8.0   Glucose, UA NEGATIVE NEGATIVE mg/dL   Hgb urine dipstick NEGATIVE NEGATIVE   Bilirubin Urine NEGATIVE NEGATIVE   Ketones, ur NEGATIVE NEGATIVE mg/dL   Protein, ur NEGATIVE NEGATIVE mg/dL   Nitrite NEGATIVE NEGATIVE   Leukocytes,Ua TRACE (A) NEGATIVE   RBC / HPF 0-5 0 - 5 RBC/hpf   WBC, UA 0-5 0 - 5 WBC/hpf   Bacteria, UA NONE SEEN NONE SEEN   Squamous Epithelial / HPF 0-5 0 - 5 /HPF   Mucus PRESENT     Comment: Performed at [facility], [address] [place], [place] [address]  Comprehensive metabolic panel     Status: Abnormal   Collection Time: 04/20/23  4:44 PM  Result Value Ref Range   Sodium 130 (L) 135 - 145 mmol/L   Potassium 3.9 3.5 - 5.1 mmol/L   Chloride 94 (L) 98 - 111 mmol/L   CO2 22 22 - 32 mmol/L   Glucose, Bld 84 70 - 99 mg/dL    Comment: Glucose reference range applies only  to samples taken after fasting for at least 8 hours.   BUN 15 8 - 23 mg/dL   Creatinine, Ser 6.04 (H) 0.44 - 1.00 mg/dL   Calcium 9.0 8.9 - 54.0 mg/dL   Total Protein 6.2 (L) 6.5 - 8.1 g/dL   Albumin 3.4 (L) 3.5 - 5.0 g/dL   AST 54 (H) 15 - 41 U/L   ALT 22 0 - 44 U/L   Alkaline Phosphatase 60 38 - 126 U/L   Total Bilirubin 1.3 (H) 0.3 - 1.2 mg/dL   GFR, Estimated 31 (L) >60 mL/min    Comment: (NOTE) Calculated using the CKD-EPI Creatinine Equation (2021)    Anion gap 14 5 - 15    Comment: Performed at [facility] [address] [place], [place] [address]  CBC with Differential     Status: Abnormal   Collection Time: 04/20/23  4:44 PM  Result Value Ref Range   WBC 6.4 4.0 - 10.5 K/uL   RBC 2.93 (L) 3.87 - 5.11 MIL/uL   Hemoglobin 9.6 (L) 12.0 - 15.0 g/dL   HCT 14.7 (L) 82.9 - 56.2 %   MCV 96.6 80.0 - 100.0 fL   MCH 32.8 26.0 - 34.0 pg   MCHC 33.9 30.0 - 36.0 g/dL   RDW 13.0 86.5 - 78.4 %   Platelets 283 150 - 400 K/uL   nRBC 0.0 0.0 - 0.2 %   Neutrophils Relative % 64 %   Neutro  Abs 4.1 1.7 - 7.7 K/uL   Lymphocytes Relative 28 %   Lymphs Abs 1.8 0.7 - 4.0 K/uL   Monocytes Relative 6 %   Monocytes Absolute 0.4 0.1 - 1.0 K/uL   Eosinophils Relative 1 %   Eosinophils Absolute 0.1 0.0 - 0.5 K/uL   Basophils Relative 1 %   Basophils Absolute 0.0 0.0 - 0.1 K/uL   Immature Granulocytes 0 %   Abs Immature Granulocytes 0.01 0.00 - 0.07 K/uL    Comment: Performed at [facility], [address] [place], [place] [address]  Vitamin D 25 hydroxy     Status: None   Collection Time: 05/10/23  1:20 PM  Result Value Ref Range   Vit D, 25-Hydroxy 93.40 30 - 100 ng/mL    Comment: (NOTE) Vitamin D deficiency has been defined by the Institute of Medicine  and an Endocrine Society practice guideline as a level of serum 25-OH  vitamin D less than 20 ng/mL (1,2). The Endocrine Society went on to  further define vitamin D insufficiency as a level between 21 and 29   ng/mL (2).  1. IOM (Institute of Medicine). 2010. Dietary reference intakes for  calcium and D. [place]: [employer]. 2. Holick MF, Binkley [place]ler[place], Bischoff-Ferrari HA, et al. Evaluation,  treatment, and prevention of vitamin D deficiency: an Endocrine  Society clinical practice guideline, JCEM. 2011 Jul; 96(7): 1911-30.  Performed at [facility], [address] [place], [place] [address]   Ferritin     Status: None   Collection Time: 05/10/23  1:20 PM  Result Value Ref Range   Ferritin 299 11 - 307 ng/mL    Comment: Performed at [facility], [address] [place], [place] [address]  Iron and TIBC([facility])     Status: None   Collection Time: 05/10/23  1:20 PM  Result Value Ref Range   Iron 63 28 - 170 ug/dL   TIBC 213 086 - 578 ug/dL   Saturation Ratios 25 10.4 - 31.8 %   UIBC 189 ug/dL    Comment: Performed at [facility], [address] [place], [place] [address]  CMP (Cancer Center only)     Status: Abnormal   Collection Time: 05/10/23  1:20 PM  Result Value Ref Range   Sodium 130 (L) 135 - 145 mmol/L   Potassium 3.3 (L) 3.5 - 5.1 mmol/L   Chloride 96 (L) 98 - 111 mmol/L   CO2 23 22 - 32 mmol/L   Glucose, Bld 108 (H) 70 - 99 mg/dL    Comment: Glucose reference range applies only to samples taken after fasting for at least 8 hours.   BUN 17 8 - 23 mg/dL   Creatinine 9.52 (H) 8.41 - 1.00 mg/dL   Calcium 9.3 8.9 - 32.4 mg/dL   Total Protein 6.7 6.5 - 8.1 g/dL   Albumin 3.8 3.5 - 5.0 g/dL   AST 52 (H) 15 - 41 U/L   ALT 24 0 - 44 U/L   Alkaline Phosphatase 68 38 - 126 U/L   Total Bilirubin 1.0 0.3 - 1.2 mg/dL   GFR, Estimated 30 (L) >60 mL/min    Comment: (NOTE) Calculated using the CKD-EPI Creatinine Equation (2021)    Anion gap 11 5 - 15    Comment: Performed at [facility], [address] [place], [place] [address]  CBC with Differential (Cancer Center Only)     Status: Abnormal   Collection  Time:  05/10/23  1:20 PM  Result Value Ref Range   WBC Count 5.1 4.0 - 10.5 K/uL   RBC 3.18 (L) 3.87 - 5.11 MIL/uL   Hemoglobin 10.4 (L) 12.0 - 15.0 g/dL   HCT 78.2 (L) 95.6 - 21.3 %   MCV 95.9 80.0 - 100.0 fL   MCH 32.7 26.0 - 34.0 pg   MCHC 34.1 30.0 - 36.0 g/dL   RDW 08.6 57.8 - 46.9 %   Platelet Count 283 150 - 400 K/uL   nRBC 0.0 0.0 - 0.2 %   Neutrophils Relative % 63 %   Neutro Abs 3.2 1.7 - 7.7 K/uL   Lymphocytes Relative 31 %   Lymphs Abs 1.6 0.7 - 4.0 K/uL   Monocytes Relative 3 %   Monocytes Absolute 0.2 0.1 - 1.0 K/uL   Eosinophils Relative 1 %   Eosinophils Absolute 0.1 0.0 - 0.5 K/uL   Basophils Relative 1 %   Basophils Absolute 0.0 0.0 - 0.1 K/uL   Immature Granulocytes 1 %   Abs Immature Granulocytes 0.07 0.00 - 0.07 K/uL    Comment: Performed at [facility], [address] [place], [place] [address]  Retic Panel     Status: Abnormal   Collection Time: 05/10/23  1:20 PM  Result Value Ref Range   Retic Ct Pct 2.2 0.4 - 3.1 %   RBC. 3.17 (L) 3.87 - 5.11 MIL/uL   Retic Count, Absolute 68.8 19.0 - 186.0 K/uL   Immature Retic Fract 11.0 2.3 - 15.9 %   Reticulocyte Hemoglobin 34.4 >27.9 pg    Comment:        Given the high negative predictive value of a RET-He result > 32 pg iron deficiency is essentially excluded. If this patient is anemic other etiologies should be considered. Performed at [facility], [address] [place], [place] [address]   Erythropoietin     Status: None   Collection Time: 05/10/23  1:20 PM  Result Value Ref Range   Erythropoietin 9.5 2.6 - 18.5 mIU/mL    Comment: (NOTE) [employer] UniCel DxI 800 Immunoassay System Values obtained with different assay methods or kits cannot be used interchangeably. Results cannot be interpreted as absolute evidence of the presence or absence of malignant disease. Performed At: [facility] [address] [place], [place] [id number] Jolene Schimke MD DG:[id number]    Vitamin B12     Status: None   Collection Time: 05/10/23  1:21 PM  Result Value Ref Range   Vitamin B-12 598 180 - 914 pg/mL    Comment: (NOTE) This assay is not validated for testing neonatal or myeloproliferative syndrome specimens for Vitamin B12 levels. Performed at [facility], [address], [place], [place] [address]   Basic metabolic panel     Status: Abnormal   Collection Time: 05/21/23  6:46 PM  Result Value Ref Range   Sodium 135 135 - 145 mmol/L   Potassium 3.1 (L) 3.5 - 5.1 mmol/L   Chloride 99 98 - 111 mmol/L   CO2 24 22 - 32 mmol/L   Glucose, Bld 105 (H) 70 - 99 mg/dL    Comment: Glucose reference range applies only to samples taken after fasting for at least 8 hours.   BUN 17 8 - 23 mg/dL   Creatinine, Ser 7.56 (H) 0.44 - 1.00 mg/dL   Calcium 8.7 (L) 8.9 - 10.3 mg/dL   GFR, Estimated 31 (L) >60 mL/min    Comment: (NOTE) Calculated using  the CKD-EPI Creatinine Equation (2021)    Anion gap 12 5 - 15    Comment: Performed at [facility], [address] [place], [place] [address]  CBC     Status: Abnormal   Collection Time: 05/21/23  6:46 PM  Result Value Ref Range   WBC 6.1 4.0 - 10.5 K/uL   RBC 3.39 (L) 3.87 - 5.11 MIL/uL   Hemoglobin 11.0 (L) 12.0 - 15.0 g/dL   HCT 44.0 (L) 10.2 - 72.5 %   MCV 97.1 80.0 - 100.0 fL   MCH 32.4 26.0 - 34.0 pg   MCHC 33.4 30.0 - 36.0 g/dL   RDW 36.6 44.0 - 34.7 %   Platelets 334 150 - 400 K/uL   nRBC 0.0 0.0 - 0.2 %    Comment: Performed at [facility], [address] [place], [place] [address]  Hepatic function panel     Status: Abnormal   Collection Time: 05/21/23  6:46 PM  Result Value Ref Range   Total Protein 7.1 6.5 - 8.1 g/dL   Albumin 3.8 3.5 - 5.0 g/dL   AST 33 15 - 41 U/L   ALT 17 0 - 44 U/L   Alkaline Phosphatase 71 38 - 126 U/L   Total Bilirubin 1.6 (H) <1.2 mg/dL   Bilirubin, Direct 0.3 (H) 0.0 - 0.2 mg/dL   Indirect Bilirubin 1.3 (H) 0.3 - 0.9 mg/dL    Comment: Performed  at [facility], [address] [place], [place] [address]  Lipase, blood     Status: None   Collection Time: 05/21/23  6:46 PM  Result Value Ref Range   Lipase 35 11 - 51 U/L    Comment: Performed at [facility], [address] [place], [place] [address]  Protime-INR     Status: None   Collection Time: 05/22/23 12:41 AM  Result Value Ref Range   Prothrombin Time 13.8 11.4 - 15.2 seconds   INR 1.0 0.8 - 1.2    Comment: (NOTE) INR goal varies based on device and disease states. Performed at [facility], [address] [place], [place] [address]   Troponin I (High Sensitivity)     Status: Abnormal   Collection Time: 05/22/23 12:42 AM  Result Value Ref Range   Troponin I (High Sensitivity) 21 (H) <18 ng/L    Comment: (NOTE) Elevated high sensitivity troponin I (hsTnI) values and significant  changes across serial measurements may suggest ACS but many other  chronic and acute conditions are known to elevate hsTnI results.  Refer to the "Links" section for chest pain algorithms and additional  guidance. Performed at [facility], [address] [place], [place] [address]   Magnesium     Status: None   Collection Time: 05/22/23 12:42 AM  Result Value Ref Range   Magnesium 1.9 1.7 - 2.4 mg/dL    Comment: Performed at [facility], [address] [place], [place] [address]  Troponin I (High Sensitivity)     Status: Abnormal   Collection Time: 05/22/23  2:23 AM  Result Value Ref Range   Troponin I (High Sensitivity) 20 (H) <18 ng/L    Comment: (NOTE) Elevated high sensitivity troponin I (hsTnI) values and significant  changes across serial measurements may suggest ACS but many other  chronic and acute conditions are known to elevate hsTnI results.  Refer to the "Links" section for chest pain algorithms and additional  guidance. Performed at [facility] [address] [place], [place] [address]   Urinalysis,  Routine w reflex microscopic -Urine, Clean Catch     Status: Abnormal   Collection Time: 05/22/23  3:25 AM  Result Value Ref Range   Color, Urine Marticia Reifschneider (A) YELLOW    Comment: BIOCHEMICALS MAY BE AFFECTED BY COLOR   APPearance HAZY (A) CLEAR   Specific Gravity, Urine 1.040 (H) 1.005 - 1.030   pH 5.0 5.0 - 8.0   Glucose, UA NEGATIVE NEGATIVE mg/dL   Hgb urine dipstick NEGATIVE NEGATIVE   Bilirubin Urine NEGATIVE NEGATIVE   Ketones, ur NEGATIVE NEGATIVE mg/dL   Protein, ur 604 (A) NEGATIVE mg/dL   Nitrite NEGATIVE NEGATIVE   Leukocytes,Ua SMALL (A) NEGATIVE   RBC / HPF 11-20 0 - 5 RBC/hpf   WBC, UA 11-20 0 - 5 WBC/hpf   Bacteria, UA RARE (A) NONE SEEN   Squamous Epithelial / HPF 11-20 0 - 5 /HPF   Mucus PRESENT     Comment: Performed at [facility], [address], [place], [place] [address]  Urine Culture     Status: Abnormal   Collection Time: 05/22/23  3:25 AM   Specimen: Urine, Clean Catch  Result Value Ref Range   Specimen Description      URINE, CLEAN CATCH Performed at [facility], [address] [place], [place] [address]    Special Requests      NONE Performed at [facility], [address] [place], [place] [address]    Culture MULTIPLE SPECIES PRESENT, SUGGEST RECOLLECTION (A)    Report Status 05/23/2023 FINAL       Assessment & Plan:  Follow current medication list, refills sent to the pharmacy.   Problem List Items Addressed This Visit       Cardiovascular and Mediastinum   Essential hypertension, benign - Primary   Relevant Medications   furosemide (LASIX) 20 MG tablet     Digestive   Gastroesophageal reflux disease without esophagitis     Other   Hyperlipidemia, unspecified   Relevant Medications   furosemide (LASIX) 20 MG tablet    Return in about 3 months (around 08/24/2023).   Total time spent: 25 minutes  [employer], NP  05/24/2023   This document may have been prepared by Dragon Voice Recognition  software and as such may include unintentional dictation errors.

## 2023-06-05 DIAGNOSIS — M25632 Stiffness of left wrist, not elsewhere classified: Secondary | ICD-10-CM | POA: Diagnosis not present

## 2023-06-05 DIAGNOSIS — S52532D Colles' fracture of left radius, subsequent encounter for closed fracture with routine healing: Secondary | ICD-10-CM | POA: Diagnosis not present

## 2023-06-05 DIAGNOSIS — Z4689 Encounter for fitting and adjustment of other specified devices: Secondary | ICD-10-CM | POA: Diagnosis not present

## 2023-06-11 ENCOUNTER — Inpatient Hospital Stay: Payer: Medicare HMO | Admitting: Oncology

## 2023-06-11 ENCOUNTER — Inpatient Hospital Stay: Payer: Medicare HMO | Attending: Oncology

## 2023-06-11 ENCOUNTER — Encounter: Payer: Self-pay | Admitting: Oncology

## 2023-06-27 ENCOUNTER — Emergency Department: Payer: Medicare HMO

## 2023-06-27 ENCOUNTER — Other Ambulatory Visit: Payer: Self-pay

## 2023-06-27 ENCOUNTER — Inpatient Hospital Stay
Admission: EM | Admit: 2023-06-27 | Discharge: 2023-07-09 | DRG: 372 | Disposition: A | Discharge status: Skilled Nursing Facility | Payer: Medicare HMO | Attending: Internal Medicine | Admitting: Internal Medicine

## 2023-06-27 DIAGNOSIS — Z853 Personal history of malignant neoplasm of breast: Secondary | ICD-10-CM

## 2023-06-27 DIAGNOSIS — Z79899 Other long term (current) drug therapy: Secondary | ICD-10-CM

## 2023-06-27 DIAGNOSIS — I252 Old myocardial infarction: Secondary | ICD-10-CM

## 2023-06-27 DIAGNOSIS — D529 Folate deficiency anemia, unspecified: Secondary | ICD-10-CM | POA: Diagnosis present

## 2023-06-27 DIAGNOSIS — I2510 Atherosclerotic heart disease of native coronary artery without angina pectoris: Secondary | ICD-10-CM

## 2023-06-27 DIAGNOSIS — Z7902 Long term (current) use of antithrombotics/antiplatelets: Secondary | ICD-10-CM

## 2023-06-27 DIAGNOSIS — N1832 Chronic kidney disease, stage 3b: Secondary | ICD-10-CM | POA: Diagnosis present

## 2023-06-27 DIAGNOSIS — R600 Localized edema: Secondary | ICD-10-CM | POA: Diagnosis not present

## 2023-06-27 DIAGNOSIS — F411 Generalized anxiety disorder: Secondary | ICD-10-CM | POA: Diagnosis present

## 2023-06-27 DIAGNOSIS — E878 Other disorders of electrolyte and fluid balance, not elsewhere classified: Secondary | ICD-10-CM | POA: Diagnosis present

## 2023-06-27 DIAGNOSIS — R935 Abnormal findings on diagnostic imaging of other abdominal regions, including retroperitoneum: Secondary | ICD-10-CM | POA: Diagnosis not present

## 2023-06-27 DIAGNOSIS — F32A Depression, unspecified: Secondary | ICD-10-CM | POA: Diagnosis present

## 2023-06-27 DIAGNOSIS — I5042 Chronic combined systolic (congestive) and diastolic (congestive) heart failure: Secondary | ICD-10-CM | POA: Diagnosis not present

## 2023-06-27 DIAGNOSIS — E440 Moderate protein-calorie malnutrition: Secondary | ICD-10-CM | POA: Insufficient documentation

## 2023-06-27 DIAGNOSIS — R634 Abnormal weight loss: Secondary | ICD-10-CM | POA: Diagnosis not present

## 2023-06-27 DIAGNOSIS — E538 Deficiency of other specified B group vitamins: Secondary | ICD-10-CM | POA: Diagnosis not present

## 2023-06-27 DIAGNOSIS — R29898 Other symptoms and signs involving the musculoskeletal system: Secondary | ICD-10-CM | POA: Diagnosis not present

## 2023-06-27 DIAGNOSIS — Z888 Allergy status to other drugs, medicaments and biological substances status: Secondary | ICD-10-CM

## 2023-06-27 DIAGNOSIS — Z66 Do not resuscitate: Secondary | ICD-10-CM | POA: Diagnosis present

## 2023-06-27 DIAGNOSIS — I9589 Other hypotension: Secondary | ICD-10-CM | POA: Diagnosis present

## 2023-06-27 DIAGNOSIS — Z6822 Body mass index (BMI) 22.0-22.9, adult: Secondary | ICD-10-CM

## 2023-06-27 DIAGNOSIS — D631 Anemia in chronic kidney disease: Secondary | ICD-10-CM | POA: Diagnosis present

## 2023-06-27 DIAGNOSIS — K921 Melena: Secondary | ICD-10-CM | POA: Diagnosis not present

## 2023-06-27 DIAGNOSIS — K529 Noninfective gastroenteritis and colitis, unspecified: Secondary | ICD-10-CM | POA: Diagnosis present

## 2023-06-27 DIAGNOSIS — N179 Acute kidney failure, unspecified: Secondary | ICD-10-CM | POA: Diagnosis present

## 2023-06-27 DIAGNOSIS — I25118 Atherosclerotic heart disease of native coronary artery with other forms of angina pectoris: Secondary | ICD-10-CM | POA: Diagnosis present

## 2023-06-27 DIAGNOSIS — F39 Unspecified mood [affective] disorder: Secondary | ICD-10-CM | POA: Diagnosis present

## 2023-06-27 DIAGNOSIS — A0472 Enterocolitis due to Clostridium difficile, not specified as recurrent: Secondary | ICD-10-CM | POA: Diagnosis present

## 2023-06-27 DIAGNOSIS — Z9011 Acquired absence of right breast and nipple: Secondary | ICD-10-CM

## 2023-06-27 DIAGNOSIS — C50311 Malignant neoplasm of lower-inner quadrant of right female breast: Secondary | ICD-10-CM | POA: Diagnosis present

## 2023-06-27 DIAGNOSIS — R54 Age-related physical debility: Secondary | ICD-10-CM | POA: Diagnosis present

## 2023-06-27 DIAGNOSIS — Z923 Personal history of irradiation: Secondary | ICD-10-CM

## 2023-06-27 DIAGNOSIS — E872 Acidosis, unspecified: Secondary | ICD-10-CM | POA: Diagnosis present

## 2023-06-27 DIAGNOSIS — E039 Hypothyroidism, unspecified: Secondary | ICD-10-CM | POA: Diagnosis not present

## 2023-06-27 DIAGNOSIS — R531 Weakness: Secondary | ICD-10-CM | POA: Diagnosis not present

## 2023-06-27 DIAGNOSIS — E785 Hyperlipidemia, unspecified: Secondary | ICD-10-CM | POA: Diagnosis present

## 2023-06-27 DIAGNOSIS — Z7951 Long term (current) use of inhaled steroids: Secondary | ICD-10-CM

## 2023-06-27 DIAGNOSIS — E860 Dehydration: Secondary | ICD-10-CM | POA: Diagnosis present

## 2023-06-27 DIAGNOSIS — Z751 Person awaiting admission to adequate facility elsewhere: Secondary | ICD-10-CM

## 2023-06-27 DIAGNOSIS — R64 Cachexia: Secondary | ICD-10-CM | POA: Diagnosis present

## 2023-06-27 DIAGNOSIS — E861 Hypovolemia: Secondary | ICD-10-CM | POA: Diagnosis present

## 2023-06-27 DIAGNOSIS — Z7982 Long term (current) use of aspirin: Secondary | ICD-10-CM

## 2023-06-27 DIAGNOSIS — Z170 Estrogen receptor positive status [ER+]: Secondary | ICD-10-CM | POA: Diagnosis not present

## 2023-06-27 DIAGNOSIS — Z743 Need for continuous supervision: Secondary | ICD-10-CM | POA: Diagnosis not present

## 2023-06-27 DIAGNOSIS — K5730 Diverticulosis of large intestine without perforation or abscess without bleeding: Secondary | ICD-10-CM | POA: Diagnosis not present

## 2023-06-27 DIAGNOSIS — Z9221 Personal history of antineoplastic chemotherapy: Secondary | ICD-10-CM

## 2023-06-27 DIAGNOSIS — Z79890 Hormone replacement therapy (postmenopausal): Secondary | ICD-10-CM

## 2023-06-27 DIAGNOSIS — I130 Hypertensive heart and chronic kidney disease with heart failure and stage 1 through stage 4 chronic kidney disease, or unspecified chronic kidney disease: Secondary | ICD-10-CM | POA: Diagnosis present

## 2023-06-27 DIAGNOSIS — E871 Hypo-osmolality and hyponatremia: Secondary | ICD-10-CM | POA: Diagnosis present

## 2023-06-27 DIAGNOSIS — Z87891 Personal history of nicotine dependence: Secondary | ICD-10-CM

## 2023-06-27 DIAGNOSIS — N189 Chronic kidney disease, unspecified: Secondary | ICD-10-CM | POA: Diagnosis not present

## 2023-06-27 DIAGNOSIS — E876 Hypokalemia: Secondary | ICD-10-CM | POA: Diagnosis not present

## 2023-06-27 DIAGNOSIS — J449 Chronic obstructive pulmonary disease, unspecified: Secondary | ICD-10-CM | POA: Diagnosis not present

## 2023-06-27 DIAGNOSIS — K580 Irritable bowel syndrome with diarrhea: Secondary | ICD-10-CM | POA: Diagnosis not present

## 2023-06-27 DIAGNOSIS — M7989 Other specified soft tissue disorders: Secondary | ICD-10-CM | POA: Diagnosis not present

## 2023-06-27 DIAGNOSIS — Z803 Family history of malignant neoplasm of breast: Secondary | ICD-10-CM

## 2023-06-27 DIAGNOSIS — S52502D Unspecified fracture of the lower end of left radius, subsequent encounter for closed fracture with routine healing: Secondary | ICD-10-CM

## 2023-06-27 DIAGNOSIS — K219 Gastro-esophageal reflux disease without esophagitis: Secondary | ICD-10-CM | POA: Diagnosis present

## 2023-06-27 DIAGNOSIS — K581 Irritable bowel syndrome with constipation: Secondary | ICD-10-CM | POA: Diagnosis present

## 2023-06-27 DIAGNOSIS — Z955 Presence of coronary angioplasty implant and graft: Secondary | ICD-10-CM

## 2023-06-27 DIAGNOSIS — I959 Hypotension, unspecified: Secondary | ICD-10-CM

## 2023-06-27 DIAGNOSIS — I10 Essential (primary) hypertension: Secondary | ICD-10-CM | POA: Diagnosis present

## 2023-06-27 DIAGNOSIS — K449 Diaphragmatic hernia without obstruction or gangrene: Secondary | ICD-10-CM | POA: Diagnosis not present

## 2023-06-27 DIAGNOSIS — I700 Atherosclerosis of aorta: Secondary | ICD-10-CM | POA: Diagnosis not present

## 2023-06-27 DIAGNOSIS — Z720 Tobacco use: Secondary | ICD-10-CM

## 2023-06-27 DIAGNOSIS — R197 Diarrhea, unspecified: Secondary | ICD-10-CM

## 2023-06-27 LAB — TYPE AND SCREEN
ABO/RH(D): O POS
Antibody Screen: NEGATIVE

## 2023-06-27 LAB — COMPREHENSIVE METABOLIC PANEL
ALT: 57 U/L — ABNORMAL HIGH (ref 0–44)
AST: 99 U/L — ABNORMAL HIGH (ref 15–41)
Albumin: 3.3 g/dL — ABNORMAL LOW (ref 3.5–5.0)
Alkaline Phosphatase: 123 U/L (ref 38–126)
Anion gap: 15 (ref 5–15)
BUN: 27 mg/dL — ABNORMAL HIGH (ref 8–23)
CO2: 22 mmol/L (ref 22–32)
Calcium: 8.2 mg/dL — ABNORMAL LOW (ref 8.9–10.3)
Chloride: 93 mmol/L — ABNORMAL LOW (ref 98–111)
Creatinine, Ser: 1.91 mg/dL — ABNORMAL HIGH (ref 0.44–1.00)
GFR, Estimated: 27 mL/min — ABNORMAL LOW (ref >60)
Glucose, Bld: 101 mg/dL — ABNORMAL HIGH (ref 70–99)
Potassium: 2.1 mmol/L — CL (ref 3.5–5.1)
Sodium: 130 mmol/L — ABNORMAL LOW (ref 135–145)
Total Bilirubin: 1.9 mg/dL — ABNORMAL HIGH (ref <1.2)
Total Protein: 7.2 g/dL (ref 6.5–8.1)

## 2023-06-27 LAB — LACTIC ACID, PLASMA: Lactic Acid, Venous: 2.2 mmol/L (ref 0.5–1.9)

## 2023-06-27 LAB — CBC
HCT: 28 % — ABNORMAL LOW (ref 36.0–46.0)
Hemoglobin: 9.8 g/dL — ABNORMAL LOW (ref 12.0–15.0)
MCH: 33.1 pg (ref 26.0–34.0)
MCHC: 35 g/dL (ref 30.0–36.0)
MCV: 94.6 fL (ref 80.0–100.0)
Platelets: 310 10*3/uL (ref 150–400)
RBC: 2.96 MIL/uL — ABNORMAL LOW (ref 3.87–5.11)
RDW: 15.6 % — ABNORMAL HIGH (ref 11.5–15.5)
WBC: 8.5 10*3/uL (ref 4.0–10.5)
nRBC: 0 % (ref 0.0–0.2)

## 2023-06-27 LAB — PROTIME-INR
INR: 1 (ref 0.8–1.2)
Prothrombin Time: 13.7 s (ref 11.4–15.2)

## 2023-06-27 LAB — MAGNESIUM: Magnesium: 1.9 mg/dL (ref 1.7–2.4)

## 2023-06-27 LAB — TROPONIN I (HIGH SENSITIVITY): Troponin I (High Sensitivity): 45 ng/L — ABNORMAL HIGH (ref <18)

## 2023-06-27 LAB — APTT: aPTT: 38 s — ABNORMAL HIGH (ref 24–36)

## 2023-06-27 MED ORDER — MONTELUKAST SODIUM 10 MG PO TABS
10.0000 mg | ORAL_TABLET | Freq: Every day | ORAL | Status: DC
  Administered 2023-06-28 – 2023-07-08 (×11): 10 mg via ORAL
  Filled 2023-06-27 (×11): qty 1

## 2023-06-27 MED ORDER — TRAZODONE HCL 50 MG PO TABS
25.0000 mg | ORAL_TABLET | Freq: Every evening | ORAL | Status: DC | PRN
  Administered 2023-07-08: 25 mg via ORAL
  Filled 2023-06-27: qty 1

## 2023-06-27 MED ORDER — DICYCLOMINE HCL 10 MG PO CAPS
10.0000 mg | ORAL_CAPSULE | Freq: Three times a day (TID) | ORAL | Status: DC
  Administered 2023-06-28 – 2023-07-09 (×34): 10 mg via ORAL
  Filled 2023-06-27 (×36): qty 1

## 2023-06-27 MED ORDER — POTASSIUM CHLORIDE CRYS ER 20 MEQ PO TBCR
40.0000 meq | EXTENDED_RELEASE_TABLET | Freq: Once | ORAL | Status: AC
  Administered 2023-06-27: 40 meq via ORAL
  Filled 2023-06-27: qty 2

## 2023-06-27 MED ORDER — CLOPIDOGREL BISULFATE 75 MG PO TABS
75.0000 mg | ORAL_TABLET | Freq: Every day | ORAL | Status: DC
Start: 2023-06-28 — End: 2023-06-28

## 2023-06-27 MED ORDER — ENOXAPARIN SODIUM 30 MG/0.3ML IJ SOSY: 30.0000 mg | PREFILLED_SYRINGE | INTRAMUSCULAR | Status: DC

## 2023-06-27 MED ORDER — ONDANSETRON HCL 4 MG/2ML IJ SOLN
4.0000 mg | Freq: Four times a day (QID) | INTRAMUSCULAR | Status: DC | PRN
  Administered 2023-07-04 – 2023-07-08 (×2): 4 mg via INTRAVENOUS
  Filled 2023-06-27 (×2): qty 2

## 2023-06-27 MED ORDER — RANOLAZINE ER 500 MG PO TB12
500.0000 mg | ORAL_TABLET | Freq: Two times a day (BID) | ORAL | Status: DC
  Administered 2023-06-28 – 2023-07-09 (×23): 500 mg via ORAL
  Filled 2023-06-27 (×23): qty 1

## 2023-06-27 MED ORDER — ATORVASTATIN CALCIUM 20 MG PO TABS: 80.0000 mg | ORAL_TABLET | Freq: Every day | ORAL | Status: DC

## 2023-06-27 MED ORDER — METRONIDAZOLE 500 MG/100ML IV SOLN
500.0000 mg | Freq: Once | INTRAVENOUS | Status: AC
  Administered 2023-06-28: 500 mg via INTRAVENOUS
  Filled 2023-06-27: qty 100

## 2023-06-27 MED ORDER — LORATADINE 10 MG PO TABS: 10.0000 mg | ORAL_TABLET | Freq: Every day | ORAL | Status: DC

## 2023-06-27 MED ORDER — TRAZODONE HCL 100 MG PO TABS
100.0000 mg | ORAL_TABLET | Freq: Every day | ORAL | Status: DC
  Administered 2023-06-28 – 2023-07-08 (×11): 100 mg via ORAL
  Filled 2023-06-27 (×11): qty 1

## 2023-06-27 MED ORDER — VANCOMYCIN HCL 1250 MG/250ML IV SOLN
1250.0000 mg | Freq: Once | INTRAVENOUS | Status: AC
  Administered 2023-06-28: 1250 mg via INTRAVENOUS
  Filled 2023-06-27: qty 250

## 2023-06-27 MED ORDER — SODIUM CHLORIDE 0.9 % IV BOLUS
1500.0000 mL | Freq: Once | INTRAVENOUS | Status: AC
  Administered 2023-06-27: 1500 mL via INTRAVENOUS

## 2023-06-27 MED ORDER — HYDROCODONE-ACETAMINOPHEN 5-325 MG PO TABS
1.0000 | ORAL_TABLET | Freq: Four times a day (QID) | ORAL | Status: DC | PRN
  Administered 2023-06-28 – 2023-07-09 (×7): 1 via ORAL
  Filled 2023-06-27 (×7): qty 1

## 2023-06-27 MED ORDER — ACETAMINOPHEN 650 MG RE SUPP: 650.0000 mg | Freq: Four times a day (QID) | RECTAL | Status: DC | PRN

## 2023-06-27 MED ORDER — POTASSIUM CHLORIDE 20 MEQ PO PACK
40.0000 meq | PACK | Freq: Once | ORAL | Status: AC
  Administered 2023-06-28: 40 meq via ORAL
  Filled 2023-06-27: qty 2

## 2023-06-27 MED ORDER — LEVOTHYROXINE SODIUM 88 MCG PO TABS
88.0000 ug | ORAL_TABLET | Freq: Every day | ORAL | Status: DC
  Administered 2023-06-28 – 2023-07-01 (×4): 88 ug via ORAL
  Filled 2023-06-27 (×4): qty 1

## 2023-06-27 MED ORDER — POTASSIUM CHLORIDE 10 MEQ/100ML IV SOLN: 10.0000 meq | INTRAVENOUS | Status: AC

## 2023-06-27 MED ORDER — MIDODRINE HCL 5 MG PO TABS
5.0000 mg | ORAL_TABLET | Freq: Three times a day (TID) | ORAL | Status: DC
  Administered 2023-06-28 – 2023-07-09 (×35): 5 mg via ORAL
  Filled 2023-06-27 (×36): qty 1

## 2023-06-27 MED ORDER — SODIUM CHLORIDE 0.9 % IV SOLN
2.0000 g | Freq: Once | INTRAVENOUS | Status: AC
  Administered 2023-06-27: 2 g via INTRAVENOUS
  Filled 2023-06-27: qty 12.5

## 2023-06-27 MED ORDER — PANTOPRAZOLE SODIUM 40 MG PO TBEC: 40.0000 mg | DELAYED_RELEASE_TABLET | Freq: Every day | ORAL | Status: DC

## 2023-06-27 MED ORDER — ONDANSETRON HCL 4 MG PO TABS: 4.0000 mg | ORAL_TABLET | Freq: Four times a day (QID) | ORAL | Status: DC | PRN

## 2023-06-27 MED ORDER — ISOSORBIDE MONONITRATE ER 30 MG PO TB24
120.0000 mg | ORAL_TABLET | Freq: Two times a day (BID) | ORAL | Status: DC
  Administered 2023-06-28 – 2023-07-09 (×20): 120 mg via ORAL
  Filled 2023-06-27 (×16): qty 4
  Filled 2023-06-27: qty 2
  Filled 2023-06-27 (×6): qty 4

## 2023-06-27 MED ORDER — FERROUS SULFATE 325 (65 FE) MG PO TABS
325.0000 mg | ORAL_TABLET | Freq: Every day | ORAL | Status: DC
  Administered 2023-06-28 – 2023-07-09 (×12): 325 mg via ORAL
  Filled 2023-06-27 (×12): qty 1

## 2023-06-27 MED ORDER — MOMETASONE FURO-FORMOTEROL FUM 200-5 MCG/ACT IN AERO
2.0000 | INHALATION_SPRAY | Freq: Two times a day (BID) | RESPIRATORY_TRACT | Status: DC
  Administered 2023-06-28 – 2023-07-09 (×23): 2 via RESPIRATORY_TRACT
  Filled 2023-06-27: qty 8.8

## 2023-06-27 MED ORDER — KETOROLAC TROMETHAMINE 0.5 % OP SOLN: 1.0000 [drp] | Freq: Four times a day (QID) | OPHTHALMIC | Status: DC

## 2023-06-27 MED ORDER — ACETAMINOPHEN 325 MG PO TABS
650.0000 mg | ORAL_TABLET | Freq: Four times a day (QID) | ORAL | Status: DC | PRN
  Administered 2023-06-28 – 2023-06-29 (×2): 650 mg via ORAL
  Filled 2023-06-27 (×2): qty 2

## 2023-06-27 MED ORDER — POTASSIUM CHLORIDE IN NACL 40-0.9 MEQ/L-% IV SOLN
INTRAVENOUS | Status: AC
  Filled 2023-06-27 (×3): qty 1000

## 2023-06-27 MED ORDER — ALPRAZOLAM 0.25 MG PO TABS: 0.2500 mg | ORAL_TABLET | Freq: Two times a day (BID) | ORAL | Status: DC | PRN

## 2023-06-27 MED ORDER — ALBUTEROL SULFATE HFA 108 (90 BASE) MCG/ACT IN AERS: 2.0000 | INHALATION_SPRAY | Freq: Four times a day (QID) | RESPIRATORY_TRACT | Status: DC | PRN

## 2023-06-27 MED ORDER — LIDOCAINE 5 % EX PTCH
1.0000 | MEDICATED_PATCH | Freq: Two times a day (BID) | CUTANEOUS | Status: DC
  Administered 2023-06-28 – 2023-07-09 (×23): 1 via TRANSDERMAL
  Filled 2023-06-27 (×22): qty 1

## 2023-06-27 NOTE — ED Provider Notes (Signed)
[facility] Provider Note    Event Date/Time   First MD Initiated Contact with Patient 06/27/23 2002     (approximate)   History   Rectal Bleeding   HPI  Abigail Ortega is a 72 y.o. female   Past medical history of prior breast cancer, CHF, CAD, CKD, hypertension, who presents to the emergency department with diarrhea for the last several weeks, generalized weakness, yellow mucousy stool and left lower quadrant pain.  No chest pain, respiratory infectious symptoms, or dysuria.  She does not take blood thinner.  She has not seen blood in the stool.  Independent Historian contributed to assessment above: Her daughter is at bedside to corroborate information past medical history as above    Physical Exam   Triage Vital Signs: ED Triage Vitals  Encounter Vitals Group     BP 06/27/23 1840 (!) 84/54     Systolic BP Percentile --      Diastolic BP Percentile --      Pulse Rate 06/27/23 1840 87     Resp 06/27/23 1840 20     Temp 06/27/23 1840 97.6 F (36.4 C)     Temp Source 06/27/23 1840 Oral     SpO2 06/27/23 1840 95 %     Weight --      Height --      Head Circumference --      Peak Flow --      Pain Score 06/27/23 1837 0     Pain Loc --      Pain Education --      Exclude from Growth Chart --     Most recent vital signs: Vitals:   06/27/23 2230 06/27/23 2300  BP: 126/88 100/69  Pulse: 69 73  Resp: 17 20  Temp:    SpO2: 100% 95%    General: Awake, no distress.  CV:  Good peripheral perfusion.  Resp:  Normal effort.  Abd:  No distention.  Other:  Appears dehydrated with dry mucous membranes poor skin turgor.  Left lower quadrant tenderness to palpation.  Rectal exam shows yellow mucousy stool.  No blood or melena.   ED Results / Procedures / Treatments   Labs (all labs ordered are listed, but only abnormal results are displayed) Labs Reviewed  CBC - Abnormal; Notable for the following components:      Result Value   RBC  2.96 (*)    Hemoglobin 9.8 (*)    HCT 28.0 (*)    RDW 15.6 (*)    All other components within normal limits  LACTIC ACID, PLASMA - Abnormal; Notable for the following components:   Lactic Acid, Venous 2.2 (*)    All other components within normal limits  APTT - Abnormal; Notable for the following components:   aPTT 38 (*)    All other components within normal limits  COMPREHENSIVE METABOLIC PANEL - Abnormal; Notable for the following components:   Sodium 130 (*)    Potassium 2.1 (*)    Chloride 93 (*)    Glucose, Bld 101 (*)    BUN 27 (*)    Creatinine, Ser 1.91 (*)    Calcium 8.2 (*)    Albumin 3.3 (*)    AST 99 (*)    ALT 57 (*)    Total Bilirubin 1.9 (*)    GFR, Estimated 27 (*)    All other components within normal limits  TROPONIN I (HIGH SENSITIVITY) - Abnormal; Notable for the following components:  Troponin I (High Sensitivity) 45 (*)    All other components within normal limits  CULTURE, BLOOD (ROUTINE X 2)  CULTURE, BLOOD (ROUTINE X 2)  GASTROINTESTINAL PANEL BY PCR, STOOL (REPLACES STOOL CULTURE)  C DIFFICILE QUICK SCREEN W PCR REFLEX    PROTIME-INR  MAGNESIUM  URINALYSIS, ROUTINE W REFLEX MICROSCOPIC  LACTIC ACID, PLASMA  URINALYSIS, W/ REFLEX TO CULTURE (INFECTION SUSPECTED)  BASIC METABOLIC PANEL  CBC  MAGNESIUM  CBG MONITORING, ED  TYPE AND SCREEN  TROPONIN I (HIGH SENSITIVITY)     I ordered and reviewed the above labs they are notable for she has a lactic acidosis of 2.2 and normal white blood cell count.  Markedly hypokalemic.  EKG  ED ECG REPORT I, Pilar Jarvis, the attending physician, personally viewed and interpreted this ECG.   Date: 06/27/2023  EKG Time: 1856  Rate: 100  Rhythm: sinus tachycardia  Axis: nl  Intervals:none  ST&T Change: No acute ischemic changes, some PVCs    RADIOLOGY I independently reviewed and interpreted CT scan of the abdomen pelvis and see no obvious inflammatory or obstructive changes I also reviewed  radiologist's formal read.   PROCEDURES:  Critical Care performed: Yes, see critical care procedure note(s)  .Critical Care  Performed by: Pilar Jarvis, MD Authorized by: Pilar Jarvis, MD   Critical care provider statement:    Critical care time (minutes):  30   Critical care was time spent personally by me on the following activities:  Development of treatment plan with patient or surrogate, discussions with consultants, evaluation of patient's response to treatment, examination of patient, ordering and review of laboratory studies, ordering and review of radiographic studies, ordering and performing treatments and interventions, pulse oximetry, re-evaluation of patient's condition and review of old charts    MEDICATIONS ORDERED IN ED: Medications  metroNIDAZOLE (FLAGYL) IVPB 500 mg (has no administration in time range)  potassium chloride 10 mEq in 100 mL IVPB (has no administration in time range)  vancomycin (VANCOREADY) IVPB 1250 mg/250 mL (has no administration in time range)  HYDROcodone-acetaminophen (NORCO/VICODIN) 5-325 MG per tablet 1 tablet (has no administration in time range)  atorvastatin (LIPITOR) tablet 80 mg (has no administration in time range)  isosorbide mononitrate (IMDUR) 24 hr tablet 120 mg (has no administration in time range)  midodrine (PROAMATINE) tablet 5 mg (has no administration in time range)  ranolazine (RANEXA) 12 hr tablet 500 mg (has no administration in time range)  ALPRAZolam (XANAX) tablet 0.25 mg (has no administration in time range)  traZODone (DESYREL) tablet 100 mg (has no administration in time range)  levothyroxine (SYNTHROID) tablet 88 mcg (has no administration in time range)  pantoprazole (PROTONIX) EC tablet 40 mg (has no administration in time range)  dicyclomine (BENTYL) capsule 10 mg (has no administration in time range)  clopidogrel (PLAVIX) tablet 75 mg (has no administration in time range)  ferrous sulfate tablet 325 mg (has no  administration in time range)  albuterol (VENTOLIN HFA) 108 (90 Base) MCG/ACT inhaler 2 puff (has no administration in time range)  mometasone-formoterol (DULERA) 200-5 MCG/ACT inhaler 2 puff (has no administration in time range)  loratadine (CLARITIN) tablet 10 mg (has no administration in time range)  montelukast (SINGULAIR) tablet 10 mg (has no administration in time range)  lidocaine (LIDODERM) 5 % 1 patch (has no administration in time range)  ketorolac (ACULAR) 0.5 % ophthalmic solution 1 drop (has no administration in time range)  enoxaparin (LOVENOX) injection 40 mg (has no administration in time range)  0.9 % NaCl with KCl 40 mEq / L  infusion (has no administration in time range)  acetaminophen (TYLENOL) tablet 650 mg (has no administration in time range)    Or  acetaminophen (TYLENOL) suppository 650 mg (has no administration in time range)  traZODone (DESYREL) tablet 25 mg (has no administration in time range)  ondansetron (ZOFRAN) tablet 4 mg (has no administration in time range)    Or  ondansetron (ZOFRAN) injection 4 mg (has no administration in time range)  potassium chloride (KLOR-CON) packet 40 mEq (has no administration in time range)  sodium chloride 0.9 % bolus 1,500 mL (0 mLs Intravenous Stopped 06/27/23 2220)  ceFEPIme (MAXIPIME) 2 g in sodium chloride 0.9 % 100 mL IVPB (0 g Intravenous Stopped 06/27/23 2256)  potassium chloride SA (KLOR-CON M) CR tablet 40 mEq (40 mEq Oral Given 06/27/23 2255)     IMPRESSION / MDM / ASSESSMENT AND PLAN / ED COURSE  I reviewed the triage vital signs and the nursing notes.                                Patient's presentation is most consistent with acute presentation with potential threat to life or bodily function.  Differential diagnosis includes, but is not limited to, reticulitis or other intra-abdominal infection, colitis, C. difficile, electrolyte disturbance, dehydration   The patient is on the cardiac monitor to  evaluate for evidence of arrhythmia and/or significant heart rate changes.  MDM:    Evidence of colitis on CT scan in the setting of profuse diarrhea and dehydrated state.  Profoundly hypokalemic so repletion was ordered in the emergency department.  Lactic acidosis of 2.2 and elevated troponin likely demand in the setting of profuse diarrhea/dehydration.  IV crystalloids ordered as well.  Admission.       FINAL CLINICAL IMPRESSION(S) / ED DIAGNOSES   Final diagnoses:  Hypokalemia  Diarrhea, unspecified type  Dehydration  Hypotension, unspecified hypotension type  Lactic acidosis     Rx / DC Orders   ED Discharge Orders     None        Note:  This document was prepared using Dragon voice recognition software and may include unintentional dictation errors.    Pilar Jarvis, MD 06/27/23 [phone]

## 2023-06-27 NOTE — Consult Note (Signed)
ED Pharmacy Antibiotic Sign Off An antibiotic consult was received from an ED provider for Vancomycin per pharmacy dosing for sepsis. A chart review was completed to assess appropriateness.   The following one time order(s) were placed:  Vancomycin 1250mg  IV   Further antibiotic and/or antibiotic pharmacy consults should be ordered by the admitting provider if indicated.   Thank you for allowing pharmacy to be a part of this patient's care.   Bettey Costa, [facility]  Clinical Pharmacist 06/27/23 9:49 PM

## 2023-06-27 NOTE — H&P (Incomplete)
Abigail Ortega   PATIENT NAME: Abigail Ortega    MR#:  [id number]  DATE OF BIRTH:  June 19, 1951  DATE OF ADMISSION:  06/27/2023  PRIMARY CARE PHYSICIAN: Margaretann Loveless, MD   Patient is coming from: Home  REQUESTING/REFERRING PHYSICIAN: Pilar Jarvis, MD   CHIEF COMPLAINT:   Recurrent diarrhea  HISTORY OF PRESENT ILLNESS:  Abigail Ortega is a 72 y.o. female with medical history significant for CHF, anxiety, depression, GERD, CKD, hypertension IBS, hypothyroidism and IBS, who presented to the emergency room with acute onset of diarrhea for the last 3 weeks with associated nausea and vomiting as well as left lower quadrant abdominal pain and occasional back pain.  She denied any dysuria, oliguria or hematuria or flank pain.  No chest pain or palpitations.  No cough or wheezing.  No melena or bright red bleeding per rectum but but has been having yellowish mucousy stools.  ED Course: When the patient came to the ER, BP was 84/54 and later 107/75 and respiratory rate was 20 and later 25.  Labs revealed hyponatremia of 130 and hypochloremia of 93 with hypokalemia of 2.1, BUN of 27 and creatinine 1.91 above previous levels.  Magnesium was 1.9 and later 1.5.  Albumin 3.3 and total protein 7.2.  Total bili 1.9 and AST 99 with ALT 57.  High sensitive troponin I was 4550.  Lactic acid was 2.2 and later 1.3 and CBC showed anemia with hemoglobin of 9.8 and hematocrit 28.  PTT 38.  Blood group was O+ with negative antibody screen.  Serum and vision came back positive and C. difficile toxin negative.  Blood cultures were drawn. EKG as reviewed by me : EKG showed normal sinus rhythm with rate of 100 with frequent PVCs inferior Q waves as well as anterolateral Q waves. Imaging: Abdominal and pelvic CT scan revealed the following: 1. Wall thickening of the distal descending colon through the rectum, compatible with inflammatory or infectious colitis. 2. Trace pelvic free fluid and perirectal fat  stranding, compatible with reactive changes. 3.  Aortic Atherosclerosis (ICD10-I70.0). 4. Decreased attenuation of the cardiac chambers, which may reflect underlying anemia.  The patient was given IV cefepime, vancomycin and Flagyl, 40 mill [place] p.o. potassium chloride and 1.5 L of IV normal saline.  She will be admitted to a medical bed for further evaluation and management. PAST MEDICAL HISTORY:   Past Medical History:  Diagnosis Date   Anxiety    Arthritis    Breast cancer (HCC) 2014   Right breast cancer - chemo, radiation and Mastectomy   CHF (congestive heart failure) (HCC) 2013   Chronic kidney disease    Clotting disorder (HCC)    blood clots   Depression    GERD (gastroesophageal reflux disease)    GI bleed    H/O heart artery stent    Headache    Heart attack (HCC) 2012   coronary stent x2 completed by Dr. Juliann Pares.   Heart attack (HCC) 04/29/2013   Hemorrhoids    Hypertension    Hypothyroidism    IBS (irritable bowel syndrome)    Malignant neoplasm of lower-inner quadrant of female breast (HCC) 01/20/2013   invasive mammary cancer, minimal 0.85 cm.  Histologic grade 1.   Personal history of chemotherapy 2014   BREAST CA   Personal history of radiation therapy 2014   BREAST CA   Thyroid disease     PAST SURGICAL HISTORY:   Past Surgical History:  Procedure Laterality Date  BOTOX INJECTION N/A 03/14/2017   Procedure: BOTOX INJECTION;  Surgeon: Leafy Ro, MD;  Location: [facility] ORS;  Service: General;  Laterality: N/A;   BOTOX INJECTION N/A 05/09/2018   Procedure: BOTOX INJECTION;  Surgeon: Leafy Ro, MD;  Location: [facility] ORS;  Service: General;  Laterality: N/A;   BREAST BIOPSY Right 2014   positive   BREAST SURGERY Right 02-21-2013   right mastectomy   CARDIAC CATHETERIZATION     COLONOSCOPY W/ BIOPSIES  09/15/2010   Colonoscopy completed by Lutricia Feil, M.D. Tubular adenoma of the ascending colon and descending colon reported up to 0.4 cm in  diameter. No atypia.   COLONOSCOPY WITH PROPOFOL N/A 12/23/2016   Procedure: COLONOSCOPY WITH PROPOFOL;  Surgeon: Charlott Rakes, MD;  Location: [facility] ENDOSCOPY;  Service: Endoscopy;  Laterality: N/A;   ESOPHAGOGASTRODUODENOSCOPY N/A 12/13/2016   Procedure: ESOPHAGOGASTRODUODENOSCOPY (EGD);  Surgeon: Wyline Mood, MD;  Location: [facility] ENDOSCOPY;  Service: Endoscopy;  Laterality: N/A;   LEFT HEART CATH N/A 04/02/2020   Procedure: Left Heart Cath with Coronary Angiography;  Surgeon: Laurier Nancy, MD;  Location: [facility] INVASIVE CV LAB;  Service: Cardiovascular;  Laterality: N/A;   LEFT HEART CATH AND CORONARY ANGIOGRAPHY Left 08/29/2018   Procedure: LEFT HEART CATH AND CORONARY ANGIOGRAPHY;  Surgeon: Laurier Nancy, MD;  Location: [facility] INVASIVE CV LAB;  Service: Cardiovascular;  Laterality: Left;   MASTECTOMY Right 2014   BREAST CA   PORTA CATH INSERTION     SPHINCTEROTOMY N/A 05/09/2018   Procedure: SPHINCTEROTOMY;  Surgeon: Leafy Ro, MD;  Location: [facility] ORS;  Service: General;  Laterality: N/A;   UPPER GI ENDOSCOPY  09/15/2010     Completed by Lutricia Feil, M.D. for nausea. Normal exam reported.   VASCULAR SURGERY      SOCIAL HISTORY:   Social History   Tobacco Use   Smoking status: Former    Current packs/day: 0.00    Average packs/day: 1 pack/day for 20.0 years (20.0 ttl pk-yrs)    Types: Cigarettes    Start date: 71    Quit date: 2014    Years since quitting: 10.9   Smokeless tobacco: Never  Substance Use Topics   Alcohol use: No    FAMILY HISTORY:   Family History  Problem Relation Age of Onset   Breast cancer Mother 50   Cancer Father        colon    DRUG ALLERGIES:   Allergies  Allergen Reactions   Nausea Control [Emetrol] Nausea And Vomiting   Other     Note: burning, nausea with XR 75 mg   Effexor [Venlafaxine] Hives, Itching and Rash   Hydroxyzine Rash and Hives    REVIEW OF SYSTEMS:   ROS As per history of present illness. All pertinent systems were  reviewed above. Constitutional, HEENT, cardiovascular, respiratory, GI, GU, musculoskeletal, neuro, psychiatric, endocrine, integumentary and hematologic systems were reviewed and are otherwise negative/unremarkable except for positive findings mentioned above in the HPI.   MEDICATIONS AT HOME:   Prior to Admission medications   Medication Sig Start Date End Date Taking? Authorizing Provider  acetaminophen (TYLENOL) 650 MG CR tablet Take 1,300 mg by mouth every 8 (eight) hours as needed for pain.    [provider]  albuterol (VENTOLIN HFA) 108 (90 Base) MCG/ACT inhaler Inhale into the lungs. 08/27/21   [provider]  ALPRAZolam Prudy Feeler) 0.25 MG tablet Take 0.25 mg by mouth 2 (two) times daily as needed for anxiety. 04/23/15   [provider]  Aspirin 81 MG CAPS  08/08/18   [provider]  atorvastatin (LIPITOR) 80 MG tablet TAKE 1 TABLET EVERY DAY 02/12/23   Laurier Nancy, MD  budesonide-formoterol [facility]) 160-4.5 MCG/ACT inhaler Inhale 2 puffs into the lungs. 12/16/19   [provider]  clopidogrel (PLAVIX) 75 MG tablet TAKE 1 TABLET EVERY DAY (NEED MD APPOINTMENT) 02/12/23   Laurier Nancy, MD  dexlansoprazole (DEXILANT) 60 MG capsule TAKE 1 CAPSULE EVERY DAY 04/18/22   Wyline Mood, MD  dicyclomine (BENTYL) 10 MG capsule TAKE 1 CAPSULE EVERY 8 HOURS FOR ABDOMINAL PAIN 11/06/22   Orson Eva, NP  ferrous sulfate 325 (65 FE) MG tablet Take 1 tablet (325 mg total) by mouth daily. 11/28/22 11/28/23  Orson Eva, NP  fexofenadine (ALLEGRA) 60 MG tablet Take 0.5 tablets (30 mg total) by mouth 2 (two) times daily as needed (allergies.). 05/24/23   Scoggins, Amber, NP  fluorometholone (FML) 0.1 % ophthalmic suspension Place 1 drop into both eyes 2 (two) times daily. 04/01/20   [provider]  furosemide (LASIX) 20 MG tablet Take 1 tablet (20 mg total) by mouth daily as needed. 05/24/23   Scoggins, Amber, NP  HYDROcodone-acetaminophen  (NORCO/VICODIN) 5-325 MG tablet Take 1 tablet by mouth every 6 (six) hours as needed for moderate pain. 04/20/23 04/19/24  Cruz Condon A, PA-C  hydrocortisone 2.5 % cream APPLY PEA SIZED TO RECTAL REGION EVERY 8 HOURS AS NEEDED FOR RELIEF 11/06/22   Orson Eva, NP  isosorbide mononitrate (IMDUR) 120 MG 24 hr tablet TAKE 1 TABLET TWICE DAILY 02/12/23   Laurier Nancy, MD  ketorolac (ACULAR) 0.5 % ophthalmic solution Place 1 drop into the left eye 4 (four) times daily. 02/26/23   [provider]  levothyroxine (SYNTHROID) 88 MCG tablet Take 1 tablet (88 mcg total) by mouth daily before breakfast. 05/24/23   Scoggins, Amber, NP  lidocaine (LIDODERM) 5 % Place 1 patch onto the skin every 12 (twelve) hours. Remove & Discard patch within 12 hours or as directed by MD 04/20/23 04/19/24  Cruz Condon A, PA-C  midodrine (PROAMATINE) 5 MG tablet Take 5 mg by mouth 3 (three) times daily.    [provider]  montelukast (SINGULAIR) 10 MG tablet TAKE 1 TABLET AT BEDTIME 01/17/23   Miki Kins, FNP  ondansetron (ZOFRAN-ODT) 4 MG disintegrating tablet Take 1 tablet (4 mg total) by mouth every 6 (six) hours as needed for nausea or vomiting. 05/22/23   Ward, Layla Maw, DO  pantoprazole (PROTONIX) 40 MG tablet Take 1 tablet (40 mg total) by mouth daily. 05/22/23 05/21/24  Ward, Layla Maw, DO  Probiotic Product (4X PROBIOTIC) TABS  03/28/18   [provider]  promethazine (PHENERGAN) 12.5 MG tablet Take 12.5 mg by mouth every 4 (four) hours as needed.    [provider]  ranolazine (RANEXA) 500 MG 12 hr tablet TAKE 1 TABLET TWICE DAILY 01/17/23   Laurier Nancy, MD  Rimegepant Sulfate (NURTEC) 75 MG TBDP  07/01/20   [provider]  traZODone (DESYREL) 100 MG tablet Take 1 tablet (100 mg total) by mouth at bedtime. TAKE 1 TABLET BY MOUTH NIGHTLY AT BEDTIME AS NEEDED FOR SLEEP 05/24/23   Scoggins, Hospital doctor, NP      VITAL SIGNS:  Blood pressure 101/73, pulse 71,  temperature 98 F (36.7 C), temperature source Oral, resp. rate 13, SpO2 99%.  PHYSICAL EXAMINATION:  Physical Exam  GENERAL:  72 y.o.-year-old patient lying in the bed with no  acute distress.  EYES: Pupils equal, round, reactive to light and accommodation. No scleral icterus. Extraocular muscles intact.  HEENT: Head atraumatic, normocephalic. Oropharynx and nasopharynx clear.  NECK:  Supple, no jugular venous distention. No thyroid enlargement, no tenderness.  LUNGS: Normal breath sounds bilaterally, no wheezing, rales,rhonchi or crepitation. No use of accessory muscles of respiration.  CARDIOVASCULAR: Regular rate and rhythm, S1, S2 normal. No murmurs, rubs, or gallops.  ABDOMEN: Soft, nondistended, nontender. Bowel sounds present. No organomegaly or mass.  EXTREMITIES: No pedal edema, cyanosis, or clubbing.  NEUROLOGIC: Cranial nerves II through XII are intact. Muscle strength 5/5 in all extremities. Sensation intact. Gait not checked.  PSYCHIATRIC: The patient is alert and oriented x 3.  Normal affect and good eye contact. SKIN: No obvious rash, lesion, or ulcer.   LABORATORY PANEL:   CBC Recent Labs  Lab 06/27/23 2104  WBC 8.5  HGB 9.8*  HCT 28.0*  PLT 310   ------------------------------------------------------------------------------------------------------------------  Chemistries  Recent Labs  Lab 06/27/23 2019 06/27/23 2104 06/28/23 0046  NA 130*  --   --   K 2.1*  --   --   CL 93*  --   --   CO2 22  --   --   GLUCOSE 101*  --   --   BUN 27*  --   --   CREATININE 1.91*  --   --   CALCIUM 8.2*  --   --   MG  --    < > 1.5*  AST 99*  --   --   ALT 57*  --   --   ALKPHOS 123  --   --   BILITOT 1.9*  --   --    < > = values in this interval not displayed.   ------------------------------------------------------------------------------------------------------------------  Cardiac Enzymes No results for input(s): "TROPONINI" in the last 168  hours. ------------------------------------------------------------------------------------------------------------------  RADIOLOGY:  CT ABDOMEN PELVIS WO CONTRAST Result Date: 06/27/2023 CLINICAL DATA:  Weakness, blood in stool for 2 months, abdominal pain, reported history of gastric cancer EXAM: CT ABDOMEN AND PELVIS WITHOUT CONTRAST TECHNIQUE: Multidetector CT imaging of the abdomen and pelvis was performed following the standard protocol without IV contrast. Unenhanced CT was performed per clinician order. Lack of IV contrast limits sensitivity and specificity, especially for evaluation of abdominal/pelvic solid viscera. RADIATION DOSE REDUCTION: This exam was performed according to the departmental dose-optimization program which includes automated exposure control, adjustment of the mA and/or kV according to patient size and/or use of iterative reconstruction technique. COMPARISON:  05/22/2023 FINDINGS: Lower chest: Stable right middle lobe scarring. No acute pleural or parenchymal lung disease. Decreased attenuation of the cardiac chambers may reflect underlying anemia. Hepatobiliary: Unremarkable unenhanced appearance of the liver and gallbladder. Pancreas: Unremarkable unenhanced appearance. Spleen: Unremarkable unenhanced appearance. Adrenals/Urinary Tract: The adrenals are unremarkable. No urinary tract calculi or obstructive uropathy within either kidney. Bladder is unremarkable. Stomach/Bowel: No bowel obstruction or ileus. Scattered diverticulosis of the descending and sigmoid colon. There is long segment circumferential wall thickening extending from the distal descending colon through the rectum, compatible with inflammatory or infectious colitis. Marked perirectal and presacral fat stranding. Stable hiatal hernia. Vascular/Lymphatic: Aortic atherosclerosis. No enlarged abdominal or pelvic lymph nodes. Reproductive: Uterus and bilateral adnexa are unremarkable. Other: Trace pelvic free fluid.  No free intraperitoneal gas. No abdominal wall hernia. Musculoskeletal: No acute or destructive bony abnormalities. Reconstructed images demonstrate no additional findings. IMPRESSION: 1. Wall thickening of the distal descending colon through the rectum, compatible with  inflammatory or infectious colitis. 2. Trace pelvic free fluid and perirectal fat stranding, compatible with reactive changes. 3.  Aortic Atherosclerosis (ICD10-I70.0). 4. Decreased attenuation of the cardiac chambers, which may reflect underlying anemia. Electronically Signed   By: Sharlet Salina M.D.   On: 06/27/2023 22:39   DG Chest Port 1 View Result Date: 06/27/2023 CLINICAL DATA:  Clinical concern for sepsis.  Ex-smoker. EXAM: PORTABLE CHEST 1 VIEW COMPARISON:  05/22/2023 FINDINGS: Normal sized heart. Partially calcified thoracic aorta. Clear lungs with normal vascularity. Stable mild dextroconvex thoracic scoliosis. IMPRESSION: No acute abnormality.  No evidence of pneumonia. Electronically Signed   By: Beckie Salts M.D.   On: 06/27/2023 21:03      IMPRESSION AND PLAN:  Assessment and Plan: * Acute colitis - The patient be admitted to a medical observation bed. - The patient will be placed on IV Rocephin and Flagyl. - She will be hydrated with IV normal saline with added potassium chloride. - Will follow BMP.  Hypokalemia - We will replace potassium as well as magnesium level.  Hyponatremia - This is likely hypovolemic. - She will be hydrated with IV normal saline and will follow BMP.  Acute kidney injury superimposed on chronic kidney disease (HCC) - The patient will be hydrated with IV normal saline and will follow BMP. - We will avoid nephrotoxins.  Chronic obstructive lung disease (HCC) - We will continue albuterol.  Coronary artery disease - We will continue aspirin, Plavix, Ranexa, Imdur and statin therapy.  Hypothyroidism - We will continue Synthroid.   DVT prophylaxis: Lovenox.  Advanced Care  Planning:  Code Status: The patient is DNR only. Family Communication:  The plan of care was discussed in details with the patient (and family). I answered all questions. The patient agreed to proceed with the above mentioned plan. Further management will depend upon hospital course. Disposition Plan: Back to previous home environment Consults called: none.  All the records are reviewed and case discussed with ED provider.  Status is: Observation  I certify that at the time of admission, it is my clinical judgment that the patient will require hospital care extending less than 2 midnights.                            Dispo: The patient is from: Home              Anticipated d/c is to: Home              Patient currently is not medically stable to d/c.              Difficult to place patient: No  Hannah Beat M.D on 06/28/2023 at 5:42 AM  [facility]   From 7 PM-7 AM, contact night-coverage [email or web address]  CC: Primary care physician; Margaretann Loveless, MD

## 2023-06-27 NOTE — ED Notes (Signed)
Pt assisted to bed from wc, bp cuff and pulse ox placed on pt, dtr at bedside, and call light within reach.

## 2023-06-27 NOTE — ED Triage Notes (Addendum)
First nurse note: Pt to ED via [facility] from home. Family called for weakness x2 months and blood in stool for x2 months that was worse today. Pt reports abd pain that has been present as well.

## 2023-06-27 NOTE — ED Triage Notes (Signed)
Family reports blood in stool and increased weakness over the past 2 months; unable to have colonoscopy until February.

## 2023-06-28 ENCOUNTER — Telehealth (HOSPITAL_COMMUNITY): Payer: Self-pay | Admitting: Pharmacy Technician

## 2023-06-28 ENCOUNTER — Encounter: Payer: Self-pay | Admitting: Oncology

## 2023-06-28 ENCOUNTER — Observation Stay: Payer: Medicare HMO

## 2023-06-28 ENCOUNTER — Other Ambulatory Visit (HOSPITAL_COMMUNITY): Payer: Self-pay

## 2023-06-28 DIAGNOSIS — E039 Hypothyroidism, unspecified: Secondary | ICD-10-CM | POA: Insufficient documentation

## 2023-06-28 DIAGNOSIS — R600 Localized edema: Secondary | ICD-10-CM | POA: Diagnosis not present

## 2023-06-28 DIAGNOSIS — N179 Acute kidney failure, unspecified: Secondary | ICD-10-CM

## 2023-06-28 DIAGNOSIS — N189 Chronic kidney disease, unspecified: Secondary | ICD-10-CM

## 2023-06-28 DIAGNOSIS — K529 Noninfective gastroenteritis and colitis, unspecified: Secondary | ICD-10-CM | POA: Diagnosis not present

## 2023-06-28 DIAGNOSIS — J449 Chronic obstructive pulmonary disease, unspecified: Secondary | ICD-10-CM | POA: Insufficient documentation

## 2023-06-28 DIAGNOSIS — M7989 Other specified soft tissue disorders: Secondary | ICD-10-CM | POA: Diagnosis not present

## 2023-06-28 DIAGNOSIS — E876 Hypokalemia: Principal | ICD-10-CM

## 2023-06-28 DIAGNOSIS — I2510 Atherosclerotic heart disease of native coronary artery without angina pectoris: Secondary | ICD-10-CM

## 2023-06-28 DIAGNOSIS — R634 Abnormal weight loss: Secondary | ICD-10-CM | POA: Diagnosis not present

## 2023-06-28 LAB — CBC WITH DIFFERENTIAL/PLATELET
Abs Immature Granulocytes: 0.04 10*3/uL (ref 0.00–0.07)
Basophils Absolute: 0 10*3/uL (ref 0.0–0.1)
Basophils Relative: 0 %
Eosinophils Absolute: 0.1 10*3/uL (ref 0.0–0.5)
Eosinophils Relative: 1 %
HCT: 22.9 % — ABNORMAL LOW (ref 36.0–46.0)
Hemoglobin: 8.4 g/dL — ABNORMAL LOW (ref 12.0–15.0)
Immature Granulocytes: 0 %
Lymphocytes Relative: 13 %
Lymphs Abs: 1.3 10*3/uL (ref 0.7–4.0)
MCH: 32.9 pg (ref 26.0–34.0)
MCHC: 36.7 g/dL — ABNORMAL HIGH (ref 30.0–36.0)
MCV: 89.8 fL (ref 80.0–100.0)
Monocytes Absolute: 1 10*3/uL (ref 0.1–1.0)
Monocytes Relative: 10 %
Neutro Abs: 7.8 10*3/uL — ABNORMAL HIGH (ref 1.7–7.7)
Neutrophils Relative %: 76 %
Platelets: 266 10*3/uL (ref 150–400)
RBC: 2.55 MIL/uL — ABNORMAL LOW (ref 3.87–5.11)
RDW: 15.7 % — ABNORMAL HIGH (ref 11.5–15.5)
WBC: 10.3 10*3/uL (ref 4.0–10.5)
nRBC: 0 % (ref 0.0–0.2)

## 2023-06-28 LAB — BASIC METABOLIC PANEL
Anion gap: 10 (ref 5–15)
Anion gap: 12 (ref 5–15)
BUN: 25 mg/dL — ABNORMAL HIGH (ref 8–23)
BUN: 24 mg/dL — ABNORMAL HIGH (ref 8–23)
CO2: 23 mmol/L (ref 22–32)
CO2: 16 mmol/L — ABNORMAL LOW (ref 22–32)
Calcium: 7.7 mg/dL — ABNORMAL LOW (ref 8.9–10.3)
Calcium: 7.4 mg/dL — ABNORMAL LOW (ref 8.9–10.3)
Chloride: 99 mmol/L (ref 98–111)
Chloride: 106 mmol/L (ref 98–111)
Creatinine, Ser: 1.79 mg/dL — ABNORMAL HIGH (ref 0.44–1.00)
Creatinine, Ser: 1.67 mg/dL — ABNORMAL HIGH (ref 0.44–1.00)
GFR, Estimated: 30 mL/min — ABNORMAL LOW (ref >60)
GFR, Estimated: 32 mL/min — ABNORMAL LOW (ref >60)
Glucose, Bld: 69 mg/dL — ABNORMAL LOW (ref 70–99)
Glucose, Bld: 70 mg/dL (ref 70–99)
Potassium: 2.6 mmol/L — CL (ref 3.5–5.1)
Potassium: 3.7 mmol/L (ref 3.5–5.1)
Sodium: 132 mmol/L — ABNORMAL LOW (ref 135–145)
Sodium: 134 mmol/L — ABNORMAL LOW (ref 135–145)

## 2023-06-28 LAB — GASTROINTESTINAL PANEL BY PCR, STOOL (REPLACES STOOL CULTURE)

## 2023-06-28 LAB — LACTIC ACID, PLASMA: Lactic Acid, Venous: 1.3 mmol/L (ref 0.5–1.9)

## 2023-06-28 LAB — CBC
HCT: 23.3 % — ABNORMAL LOW (ref 36.0–46.0)
Hemoglobin: 8.4 g/dL — ABNORMAL LOW (ref 12.0–15.0)
MCH: 32.7 pg (ref 26.0–34.0)
MCHC: 36.1 g/dL — ABNORMAL HIGH (ref 30.0–36.0)
MCV: 90.7 fL (ref 80.0–100.0)
Platelets: 256 10*3/uL (ref 150–400)
RBC: 2.57 MIL/uL — ABNORMAL LOW (ref 3.87–5.11)
RDW: 15.5 % (ref 11.5–15.5)
WBC: 9.9 10*3/uL (ref 4.0–10.5)
nRBC: 0 % (ref 0.0–0.2)

## 2023-06-28 LAB — FOLATE: Folate: 4.6 ng/mL — ABNORMAL LOW (ref >5.9)

## 2023-06-28 LAB — TECHNOLOGIST SMEAR REVIEW: Plt Morphology: NORMAL

## 2023-06-28 LAB — C DIFFICILE QUICK SCREEN W PCR REFLEX
C Diff antigen: POSITIVE — AB
C Diff toxin: NEGATIVE

## 2023-06-28 LAB — CLOSTRIDIUM DIFFICILE BY PCR, REFLEXED: Toxigenic C. Difficile by PCR: POSITIVE — AB

## 2023-06-28 LAB — MAGNESIUM
Magnesium: 1.5 mg/dL — ABNORMAL LOW (ref 1.7–2.4)
Magnesium: 1.6 mg/dL — ABNORMAL LOW (ref 1.7–2.4)

## 2023-06-28 LAB — TSH: TSH: 316 u[IU]/mL — ABNORMAL HIGH (ref 0.350–4.500)

## 2023-06-28 LAB — TROPONIN I (HIGH SENSITIVITY): Troponin I (High Sensitivity): 50 ng/L — ABNORMAL HIGH (ref <18)

## 2023-06-28 MED ORDER — PANTOPRAZOLE SODIUM 40 MG IV SOLR
40.0000 mg | Freq: Two times a day (BID) | INTRAVENOUS | Status: DC
Start: 2023-06-28 — End: 2023-07-02
  Administered 2023-06-28 – 2023-07-02 (×9): 40 mg via INTRAVENOUS
  Filled 2023-06-28 (×9): qty 10

## 2023-06-28 MED ORDER — METRONIDAZOLE 500 MG/100ML IV SOLN: 500.0000 mg | Freq: Three times a day (TID) | INTRAVENOUS | Status: DC

## 2023-06-28 MED ORDER — VANCOMYCIN HCL 125 MG PO CAPS
125.0000 mg | ORAL_CAPSULE | Freq: Four times a day (QID) | ORAL | Status: AC
  Administered 2023-06-28 – 2023-07-07 (×39): 125 mg via ORAL
  Filled 2023-06-28 (×41): qty 1

## 2023-06-28 MED ORDER — POTASSIUM CHLORIDE CRYS ER 20 MEQ PO TBCR
60.0000 meq | EXTENDED_RELEASE_TABLET | Freq: Once | ORAL | Status: AC
  Administered 2023-06-28: 60 meq via ORAL
  Filled 2023-06-28: qty 3

## 2023-06-28 MED ORDER — MAGNESIUM SULFATE 2 GM/50ML IV SOLN
2.0000 g | Freq: Once | INTRAVENOUS | Status: AC
  Administered 2023-06-28: 2 g via INTRAVENOUS
  Filled 2023-06-28: qty 50

## 2023-06-28 MED ORDER — ALBUTEROL SULFATE (2.5 MG/3ML) 0.083% IN NEBU: 2.5000 mg | INHALATION_SOLUTION | Freq: Four times a day (QID) | RESPIRATORY_TRACT | Status: DC | PRN

## 2023-06-28 MED ORDER — SODIUM CHLORIDE 0.9 % IV SOLN: 2.0000 g | INTRAVENOUS | Status: DC

## 2023-06-28 NOTE — ED Notes (Signed)
Moved Pt to hospital bed. Changed brief Pt incontinent of stool and urine. Pt asking for her electric blanket. Informed pt and family that she can have the blanket but not have it plugged in. Family and pt state understanding. Pt given 2 warm blankets. Family at bedside.

## 2023-06-28 NOTE — Assessment & Plan Note (Addendum)
TSH on admission 316.  She has no symptoms of decreased mentation, hypothermia, congestive heart failure, doubt myxedema.  However repeat TSH today up to 328, fT4 0.3.  Wonder about poor absorption with cdiff? Not myxedema, no need for cortisol level. - Stop PO LT4 - Start IV levothyroxine

## 2023-06-28 NOTE — Assessment & Plan Note (Addendum)
Creatinine 1.8 on admission, improved to baseline 1.4  CKD stage IIIa

## 2023-06-28 NOTE — Progress Notes (Addendum)
PROGRESS NOTE    Abigail Ortega  ZOX:[id number] DOB: 01/03/1951 DOA: 06/27/2023 PCP: Margaretann Loveless, MD  Outpatient Specialists: gi    Brief Narrative:   From admission h and p  Abigail Ortega is a 72 y.o. female with medical history significant for CHF, anxiety, depression, GERD, CKD, hypertension IBS, hypothyroidism and IBS, who presented to the emergency room with acute onset of diarrhea for the last 3 weeks with associated nausea and vomiting as well as left lower quadrant abdominal pain and occasional back pain.  She denied any dysuria, oliguria or hematuria or flank pain.  No chest pain or palpitations.  No cough or wheezing.  No melena or bright red bleeding per rectum.   Assessment & Plan:   Principal Problem:   Acute colitis Active Problems:   Hypokalemia   Hyponatremia   Acute kidney injury superimposed on chronic kidney disease (HCC)   Breast cancer of lower-inner quadrant of right female breast (HCC)   Essential hypertension, benign   Hypothyroid   Hypothyroidism   Coronary artery disease   Chronic obstructive lung disease (HCC)  # C diff colitis Appears to be her first episode of this, don't see that she's had recent antibiotics. Colitis seen on ct without complications - start vancomycin oral - continue fluids  # Unintentional weight loss Several months of poor appetite, intermittent vomiting, and 50 pounds weight loss, with anemia as well. Has egd scheduled for February - spoke w/ dr. Mia Creek of gi today, says c diff wouldn't preclude egd but electrolyte abnormalities would, so advises stabilize and replete, continue c diff treatment, can consider egd in the next few days - slp eval - RD consult  # Normocytic anemia Baseline hgb is around 10, here 9.> 8.4, no bleeding. Recent eval for iron deficiency neg, normal b12 - f/u folic acid and smear - gi involvement as above - start IV PPI BID  # Left arm swelling # Distal left radius  fracture Fracture followed by ortho, is being managed non-operatively. Soft tissue swelling in this arm as well, quite possibly 2/2 fracture - will check PVL to r/o DVT  # Hypokalemia # Hypomagnesemia - replete and trend  # CKD 3b Cr of around 30 appears to be at baseline, arrived with slight bump in cr likely 2/2 diarrhea - continue fluids - hold home lasix  # Chronic hypotension - cont home midodrine  # GAD - home alprazolam  # Hypothyroid - home levo - f/u tsh  # CAD History of stent years ago, 2021 lhc showed patent stent, advised treat medically. No chest pain - will hold aspirin/statin for now given anemia w/u - probably doesn't need to be on dapt  # Debility - PT consult  DVT prophylaxis: scds Code Status: dnr Family Communication: sister updated @ bedside  Level of care: Med-Surg Status is: Observation    Consultants:  GI  Procedures: pending  Antimicrobials:  Oral vanc, s/p vanc/ceftriaxone/flagyl    Subjective: Reports no pain, had mucous BM earlier today  Objective: Vitals:   06/28/23 0230 06/28/23 0630 06/28/23 0640 06/28/23 0745  BP: 101/73 98/66    Pulse: 71 77    Resp: 13 (!) 21    Temp:   98 F (36.7 C)   TempSrc:   Oral   SpO2: 99% 99%  97%   No intake or output data in the 24 hours ending 06/28/23 0932 There were no vitals filed for this visit.  Examination:  General exam: Appears calm  and comfortable, chronically ill appearing Respiratory system: Clear to auscultation. Respiratory effort normal. Cardiovascular system: S1 & S2 heard, RRR. Soft systolic murmur Gastrointestinal system: Abdomen is nondistended, soft and nontender.  Central nervous system: Alert and oriented. No focal neurological deficits. Extremities: Symmetric 5 x 5 power. Skin: No rashes, lesions or ulcers Psychiatry: a bit confused    Data Reviewed: I have personally reviewed following labs and imaging studies  CBC: Recent Labs  Lab 06/27/23 2104  06/28/23 0634  WBC 8.5 9.9  HGB 9.8* 8.4*  HCT 28.0* 23.3*  MCV 94.6 90.7  PLT 310 256   Basic Metabolic Panel: Recent Labs  Lab 06/27/23 2019 06/27/23 2104 06/28/23 0046 06/28/23 0634  NA 130*  --   --  132*  K 2.1*  --   --  2.6*  CL 93*  --   --  99  CO2 22  --   --  23  GLUCOSE 101*  --   --  69*  BUN 27*  --   --  25*  CREATININE 1.91*  --   --  1.79*  CALCIUM 8.2*  --   --  7.7*  MG  --  1.9 1.5*  --    GFR: CrCl cannot be calculated (Unknown ideal weight.). Liver Function Tests: Recent Labs  Lab 06/27/23 2019  AST 99*  ALT 57*  ALKPHOS 123  BILITOT 1.9*  PROT 7.2  ALBUMIN 3.3*   No results for input(s): "LIPASE", "AMYLASE" in the last 168 hours. No results for input(s): "AMMONIA" in the last 168 hours. Coagulation Profile: Recent Labs  Lab 06/27/23 2019  INR 1.0   Cardiac Enzymes: No results for input(s): "CKTOTAL", "CKMB", "CKMBINDEX", "TROPONINI" in the last 168 hours. BNP (last 3 results) No results for input(s): "PROBNP" in the last 8760 hours. HbA1C: No results for input(s): "HGBA1C" in the last 72 hours. CBG: No results for input(s): "GLUCAP" in the last 168 hours. Lipid Profile: No results for input(s): "CHOL", "HDL", "LDLCALC", "TRIG", "CHOLHDL", "LDLDIRECT" in the last 72 hours. Thyroid Function Tests: No results for input(s): "TSH", "T4TOTAL", "FREET4", "T3FREE", "THYROIDAB" in the last 72 hours. Anemia Panel: No results for input(s): "VITAMINB12", "FOLATE", "FERRITIN", "TIBC", "IRON", "RETICCTPCT" in the last 72 hours. Urine analysis:    Component Value Date/Time   COLORURINE AMBER (A) 05/22/2023 0325   APPEARANCEUR HAZY (A) 05/22/2023 0325   APPEARANCEUR Clear 02/26/2014 1546   LABSPEC 1.040 (H) 05/22/2023 0325   LABSPEC 1.012 02/26/2014 1546   PHURINE 5.0 05/22/2023 0325   GLUCOSEU NEGATIVE 05/22/2023 0325   GLUCOSEU Negative 02/26/2014 1546   HGBUR NEGATIVE 05/22/2023 0325   BILIRUBINUR NEGATIVE 05/22/2023 0325    BILIRUBINUR Negative 02/26/2014 1546   KETONESUR NEGATIVE 05/22/2023 0325   PROTEINUR 100 (A) 05/22/2023 0325   NITRITE NEGATIVE 05/22/2023 0325   LEUKOCYTESUR SMALL (A) 05/22/2023 0325   LEUKOCYTESUR 1+ 02/26/2014 1546   Sepsis Labs: @LABRCNTIP (procalcitonin:4,lacticidven:4)  ) Recent Results (from the past 240 hours)  Blood Culture (routine x 2)     Status: None (Preliminary result)   Collection Time: 06/27/23  9:04 PM   Specimen: BLOOD  Result Value Ref Range Status   Specimen Description BLOOD BLOOD LEFT ARM  Final   Special Requests   Final    BOTTLES DRAWN AEROBIC AND ANAEROBIC Blood Culture results may not be optimal due to an inadequate volume of blood received in culture bottles   Culture   Final    NO GROWTH < 12 HOURS Performed at  [facility], [address] [place], [place] [address]    Report Status PENDING  Incomplete  Blood Culture (routine x 2)     Status: None (Preliminary result)   Collection Time: 06/27/23  9:04 PM   Specimen: BLOOD  Result Value Ref Range Status   Specimen Description BLOOD BLOOD RIGHT ARM  Final   Special Requests   Final    BOTTLES DRAWN AEROBIC AND ANAEROBIC Blood Culture results may not be optimal due to an inadequate volume of blood received in culture bottles   Culture   Final    NO GROWTH < 12 HOURS Performed at [facility], [address] [place], [place] [address]    Report Status PENDING  Incomplete  Gastrointestinal Panel by PCR , Stool     Status: None   Collection Time: 06/28/23  4:00 AM   Specimen: Stool  Result Value Ref Range Status   Campylobacter species NOT DETECTED NOT DETECTED Final   Plesimonas shigelloides NOT DETECTED NOT DETECTED Final   Salmonella species NOT DETECTED NOT DETECTED Final   Yersinia enterocolitica NOT DETECTED NOT DETECTED Final   Vibrio species NOT DETECTED NOT DETECTED Final   Vibrio cholerae NOT DETECTED NOT DETECTED Final   Enteroaggregative E coli (EAEC) NOT  DETECTED NOT DETECTED Final   Enteropathogenic E coli (EPEC) NOT DETECTED NOT DETECTED Final   Enterotoxigenic E coli (ETEC) NOT DETECTED NOT DETECTED Final   Shiga like toxin producing E coli (STEC) NOT DETECTED NOT DETECTED Final   Shigella/Enteroinvasive E coli (EIEC) NOT DETECTED NOT DETECTED Final   Cryptosporidium NOT DETECTED NOT DETECTED Final   Cyclospora cayetanensis NOT DETECTED NOT DETECTED Final   Entamoeba histolytica NOT DETECTED NOT DETECTED Final   Giardia lamblia NOT DETECTED NOT DETECTED Final   Adenovirus F40/41 NOT DETECTED NOT DETECTED Final   Astrovirus NOT DETECTED NOT DETECTED Final   Norovirus GI/GII NOT DETECTED NOT DETECTED Final   Rotavirus A NOT DETECTED NOT DETECTED Final   Sapovirus (I, II, IV, and V) NOT DETECTED NOT DETECTED Final    Comment: Performed at [facility], [address] [place], [place] [address]  C Difficile Quick Screen w PCR reflex     Status: Abnormal   Collection Time: 06/28/23  4:00 AM   Specimen: Stool  Result Value Ref Range Status   C Diff antigen POSITIVE (A) NEGATIVE Final   C Diff toxin NEGATIVE NEGATIVE Final   C Diff interpretation Results are indeterminate. See PCR results.  Final    Comment: Performed at [facility], [address] [place], [place] [address]  C. Diff by PCR, Reflexed     Status: Abnormal   Collection Time: 06/28/23  4:00 AM  Result Value Ref Range Status   Toxigenic C. Difficile by PCR POSITIVE (A) NEGATIVE Final    Comment: Positive for toxigenic C. difficile with little to no toxin production. Only treat if clinical presentation suggests symptomatic illness. Performed at [facility], [address] [place], [place] [address]          Radiology Studies: CT ABDOMEN PELVIS WO CONTRAST Result Date: 06/27/2023 CLINICAL DATA:  Weakness, blood in stool for 2 months, abdominal pain, reported history of gastric cancer EXAM: CT ABDOMEN AND PELVIS WITHOUT CONTRAST  TECHNIQUE: Multidetector CT imaging of the abdomen and pelvis was performed following the standard protocol without IV contrast. Unenhanced CT was performed per clinician order. Lack of IV contrast limits sensitivity and specificity, especially for evaluation  of abdominal/pelvic solid viscera. RADIATION DOSE REDUCTION: This exam was performed according to the departmental dose-optimization program which includes automated exposure control, adjustment of the mA and/or kV according to patient size and/or use of iterative reconstruction technique. COMPARISON:  05/22/2023 FINDINGS: Lower chest: Stable right middle lobe scarring. No acute pleural or parenchymal lung disease. Decreased attenuation of the cardiac chambers may reflect underlying anemia. Hepatobiliary: Unremarkable unenhanced appearance of the liver and gallbladder. Pancreas: Unremarkable unenhanced appearance. Spleen: Unremarkable unenhanced appearance. Adrenals/Urinary Tract: The adrenals are unremarkable. No urinary tract calculi or obstructive uropathy within either kidney. Bladder is unremarkable. Stomach/Bowel: No bowel obstruction or ileus. Scattered diverticulosis of the descending and sigmoid colon. There is long segment circumferential wall thickening extending from the distal descending colon through the rectum, compatible with inflammatory or infectious colitis. Marked perirectal and presacral fat stranding. Stable hiatal hernia. Vascular/Lymphatic: Aortic atherosclerosis. No enlarged abdominal or pelvic lymph nodes. Reproductive: Uterus and bilateral adnexa are unremarkable. Other: Trace pelvic free fluid. No free intraperitoneal gas. No abdominal wall hernia. Musculoskeletal: No acute or destructive bony abnormalities. Reconstructed images demonstrate no additional findings. IMPRESSION: 1. Wall thickening of the distal descending colon through the rectum, compatible with inflammatory or infectious colitis. 2. Trace pelvic free fluid and  perirectal fat stranding, compatible with reactive changes. 3.  Aortic Atherosclerosis (ICD10-I70.0). 4. Decreased attenuation of the cardiac chambers, which may reflect underlying anemia. Electronically Signed   By: Sharlet Salina M.D.   On: 06/27/2023 22:39   DG Chest Port 1 View Result Date: 06/27/2023 CLINICAL DATA:  Clinical concern for sepsis.  Ex-smoker. EXAM: PORTABLE CHEST 1 VIEW COMPARISON:  05/22/2023 FINDINGS: Normal sized heart. Partially calcified thoracic aorta. Clear lungs with normal vascularity. Stable mild dextroconvex thoracic scoliosis. IMPRESSION: No acute abnormality.  No evidence of pneumonia. Electronically Signed   By: Beckie Salts M.D.   On: 06/27/2023 21:03        Scheduled Meds:  atorvastatin  80 mg Oral Daily   clopidogrel  75 mg Oral Daily   dicyclomine  10 mg Oral TID AC   enoxaparin (LOVENOX) injection  30 mg Subcutaneous Q24H   ferrous sulfate  325 mg Oral Daily   isosorbide mononitrate  120 mg Oral BID   levothyroxine  88 mcg Oral Q0600   lidocaine  1 patch Transdermal Q12H   loratadine  10 mg Oral Daily   midodrine  5 mg Oral TID WC   mometasone-formoterol  2 puff Inhalation BID   montelukast  10 mg Oral QHS   pantoprazole  40 mg Oral Daily   ranolazine  500 mg Oral BID   traZODone  100 mg Oral QHS   Continuous Infusions:  0.9 % NaCl with KCl 40 mEq / L 100 mL/hr at 06/28/23 0039   metronidazole       LOS: 0 days     Silvano Bilis, MD [facility]   If 7PM-7AM, please contact night-coverage [email or web address] Password [facility] 06/28/2023, 9:32 AM

## 2023-06-28 NOTE — ED Notes (Signed)
Family at bedside. Pt set up for breakfast. NAD. Call light within reach

## 2023-06-28 NOTE — Consult Note (Signed)
Pharmacy Antibiotic Note  Abigail Ortega is a 72 y.o. female admitted on 06/27/2023 with  C.Diff colitis .  Based on chart review no previous history of C. Diff noted, WBC is wnl, and patient afebrile.  Pharmacy has been consulted for vancomycin oral dosing.  Plan: Start Vancomycin 125 mg po four times a day x 10 days     Temp (24hrs), Avg:97.9 F (36.6 C), Min:97.6 F (36.4 C), Max:98 F (36.7 C)  Recent Labs  Lab 06/27/23 2019 06/27/23 2104 06/28/23 0046 06/28/23 0634  WBC  --  8.5  --  9.9  CREATININE 1.91*  --   --  1.79*  LATICACIDVEN  --  2.2* 1.3  --     CrCl cannot be calculated (Unknown ideal weight.).    Allergies  Allergen Reactions   Nausea Control [Emetrol] Nausea And Vomiting   Other     Note: burning, nausea with XR 75 mg   Effexor [Venlafaxine] Hives, Itching and Rash   Hydroxyzine Rash and Hives    Antimicrobials this admission: Vancomycin IV x 1 12/11 cefepime 12/11 x 1 Flagyl 12/11 x 1 Vancomycin po 12/12>>  Dose adjustments this admission: N/A  Microbiology results: 12/11 BCx: ngtd C. Diff Quick Screen: C. Diff antigen positive C. Diff PCR: Positive  Thank you for allowing pharmacy to be a part of this patient's care.  Barrie Folk, PharmD 06/28/2023 10:03 AM

## 2023-06-28 NOTE — Assessment & Plan Note (Addendum)
-  Supplemented and resolved

## 2023-06-28 NOTE — Assessment & Plan Note (Addendum)
Asymptomatic, no wheezing - Continue Singulair, PPI - Continue ICS/LABA

## 2023-06-28 NOTE — Telephone Encounter (Signed)
Patient Product/process development scientist completed.    The patient is insured through [employer]. Patient has Medicare and is not eligible for a copay card, but may be able to apply for patient assistance, if available.    Ran test claim for Dificid 200 mg and the current 10 day co-pay is $11.20.  Ran test claim for Vancomycin 125 mg and the current 10 day co-pay is $4.50.  This test claim was processed through [facility]- copay amounts may vary at other pharmacies due to pharmacy/plan contracts, or as the patient moves through the different stages of their insurance plan.     Roland Earl, CPHT Pharmacy Technician III Certified Patient Advocate [facility] Pharmacy Patient Advocate Team Direct Number: [phone]  Fax: [phone]

## 2023-06-28 NOTE — Consult Note (Signed)
Consultation  Referring Provider:  Hospitalist    Admit date: 06/27/2023 Consult date: 06/28/2023         Reason for Consultation: Weight loss              HPI:   Abigail Ortega is a 72 y.o. lady with history of breast cancer, hypothyroidsim, CAD, and IBS-C who presents with diarrhea and diagnosed with c. Diff infection. She notes a 50 pound weight loss over one year. She was seen by Dr. Tobi Bastos in September and scheduled for EGD/Colonoscopy but she rescheduled and eventually cancelled. She is scheduled to see him soon. She had a colonoscopy in 2018 that was overall unremarkable. We were consulted for any possible procedures while she is here. She has several electrolyte abnormalities that need addressing. She was found to have colitis on CT that is consistent with infection.   Past Medical History:  Diagnosis Date   Anxiety    Arthritis    Breast cancer (HCC) 2014   Right breast cancer - chemo, radiation and Mastectomy   CHF (congestive heart failure) (HCC) 2013   Chronic kidney disease    Clotting disorder (HCC)    blood clots   Depression    GERD (gastroesophageal reflux disease)    GI bleed    H/O heart artery stent    Headache    Heart attack (HCC) 2012   coronary stent x2 completed by Dr. Juliann Pares.   Heart attack (HCC) 04/29/2013   Hemorrhoids    Hypertension    Hypothyroidism    IBS (irritable bowel syndrome)    Malignant neoplasm of lower-inner quadrant of female breast (HCC) 01/20/2013   invasive mammary cancer, minimal 0.85 cm.  Histologic grade 1.   Personal history of chemotherapy 2014   BREAST CA   Personal history of radiation therapy 2014   BREAST CA   Thyroid disease     Past Surgical History:  Procedure Laterality Date   BOTOX INJECTION N/A 03/14/2017   Procedure: BOTOX INJECTION;  Surgeon: Leafy Ro, MD;  Location: [facility] ORS;  Service: General;  Laterality: N/A;   BOTOX INJECTION N/A 05/09/2018   Procedure: BOTOX INJECTION;  Surgeon: Leafy Ro, MD;  Location: [facility] ORS;  Service: General;  Laterality: N/A;   BREAST BIOPSY Right 2014   positive   BREAST SURGERY Right 02-21-2013   right mastectomy   CARDIAC CATHETERIZATION     COLONOSCOPY W/ BIOPSIES  09/15/2010   Colonoscopy completed by Lutricia Feil, M.D. Tubular adenoma of the ascending colon and descending colon reported up to 0.4 cm in diameter. No atypia.   COLONOSCOPY WITH PROPOFOL N/A 12/23/2016   Procedure: COLONOSCOPY WITH PROPOFOL;  Surgeon: Charlott Rakes, MD;  Location: [facility] ENDOSCOPY;  Service: Endoscopy;  Laterality: N/A;   ESOPHAGOGASTRODUODENOSCOPY N/A 12/13/2016   Procedure: ESOPHAGOGASTRODUODENOSCOPY (EGD);  Surgeon: Wyline Mood, MD;  Location: [facility] ENDOSCOPY;  Service: Endoscopy;  Laterality: N/A;   LEFT HEART CATH N/A 04/02/2020   Procedure: Left Heart Cath with Coronary Angiography;  Surgeon: Laurier Nancy, MD;  Location: [facility] INVASIVE CV LAB;  Service: Cardiovascular;  Laterality: N/A;   LEFT HEART CATH AND CORONARY ANGIOGRAPHY Left 08/29/2018   Procedure: LEFT HEART CATH AND CORONARY ANGIOGRAPHY;  Surgeon: Laurier Nancy, MD;  Location: [facility] INVASIVE CV LAB;  Service: Cardiovascular;  Laterality: Left;   MASTECTOMY Right 2014   BREAST CA   PORTA CATH INSERTION     SPHINCTEROTOMY N/A 05/09/2018   Procedure: SPHINCTEROTOMY;  Surgeon: Leafy Ro,  MD;  Location: [facility] ORS;  Service: General;  Laterality: N/A;   UPPER GI ENDOSCOPY  09/15/2010     Completed by Lutricia Feil, M.D. for nausea. Normal exam reported.   VASCULAR SURGERY      Family History  Problem Relation Age of Onset   Breast cancer Mother 19   Cancer Father        colon    Social History   Tobacco Use   Smoking status: Former    Current packs/day: 0.00    Average packs/day: 1 pack/day for 20.0 years (20.0 ttl pk-yrs)    Types: Cigarettes    Start date: 12    Quit date: 2014    Years since quitting: 10.9   Smokeless tobacco: Never  Vaping Use   Vaping status: Never Used  Substance Use  Topics   Alcohol use: No   Drug use: No    Prior to Admission medications   Medication Sig Start Date End Date Taking? Authorizing Provider  acetaminophen (TYLENOL) 650 MG CR tablet Take 1,300 mg by mouth every 8 (eight) hours as needed for pain.   Yes [provider]  ALPRAZolam (XANAX) 0.25 MG tablet Take 0.25 mg by mouth 2 (two) times daily as needed for anxiety. 04/23/15  Yes [provider]  aspirin EC 81 MG tablet Take 81 mg by mouth daily. 08/08/18  Yes [provider]  atorvastatin (LIPITOR) 80 MG tablet TAKE 1 TABLET EVERY DAY 02/12/23  Yes Laurier Nancy, MD  clopidogrel (PLAVIX) 75 MG tablet TAKE 1 TABLET EVERY DAY (NEED MD APPOINTMENT) 02/12/23  Yes Laurier Nancy, MD  dexlansoprazole (DEXILANT) 60 MG capsule TAKE 1 CAPSULE EVERY DAY 04/18/22  Yes Wyline Mood, MD  dicyclomine (BENTYL) 10 MG capsule TAKE 1 CAPSULE EVERY 8 HOURS FOR ABDOMINAL PAIN 11/06/22  Yes Orson Eva, NP  ferrous sulfate 325 (65 FE) MG tablet Take 1 tablet (325 mg total) by mouth daily. 11/28/22 11/28/23 Yes Orson Eva, NP  fexofenadine (ALLEGRA) 60 MG tablet Take 0.5 tablets (30 mg total) by mouth 2 (two) times daily as needed (allergies.). 05/24/23  Yes Scoggins, Amber, NP  furosemide (LASIX) 20 MG tablet Take 1 tablet (20 mg total) by mouth daily as needed. 05/24/23  Yes Scoggins, Hospital doctor, NP  hydrocortisone 2.5 % cream APPLY PEA SIZED TO RECTAL REGION EVERY 8 HOURS AS NEEDED FOR RELIEF 11/06/22  Yes Orson Eva, NP  isosorbide mononitrate (IMDUR) 120 MG 24 hr tablet TAKE 1 TABLET TWICE DAILY 02/12/23  Yes Laurier Nancy, MD  levothyroxine (SYNTHROID) 88 MCG tablet Take 1 tablet (88 mcg total) by mouth daily before breakfast. 05/24/23  Yes Scoggins, Amber, NP  lidocaine (LIDODERM) 5 % Place 1 patch onto the skin every 12 (twelve) hours. Remove & Discard patch within 12 hours or as directed by MD 04/20/23 04/19/24 Yes Robley Fries, Lauren A, PA-C  midodrine (PROAMATINE) 10 MG tablet Take  10 mg by mouth 3 (three) times daily. 04/27/23  Yes [provider]  montelukast (SINGULAIR) 10 MG tablet TAKE 1 TABLET AT BEDTIME 01/17/23  Yes Miki Kins, FNP  ondansetron (ZOFRAN-ODT) 4 MG disintegrating tablet Take 1 tablet (4 mg total) by mouth every 6 (six) hours as needed for nausea or vomiting. 05/22/23  Yes Ward, Kristen N, DO  pantoprazole (PROTONIX) 40 MG tablet Take 1 tablet (40 mg total) by mouth daily. 05/22/23 05/21/24 Yes Ward, Layla Maw, DO  ranolazine (RANEXA) 500 MG 12 hr tablet TAKE 1 TABLET TWICE DAILY 01/17/23  Yes Laurier Nancy, MD  traZODone (DESYREL) 100 MG tablet Take 1 tablet (100 mg total) by mouth at bedtime. TAKE 1 TABLET BY MOUTH NIGHTLY AT BEDTIME AS NEEDED FOR SLEEP 05/24/23  Yes Scoggins, Amber, NP  albuterol (VENTOLIN HFA) 108 (90 Base) MCG/ACT inhaler Inhale into the lungs. Patient not taking: Reported on 06/28/2023 08/27/21   [provider]  budesonide-formoterol (SYMBICORT) 160-4.5 MCG/ACT inhaler Inhale 2 puffs into the lungs. Patient not taking: Reported on 06/28/2023 12/16/19   [provider]  fluorometholone (FML) 0.1 % ophthalmic suspension Place 1 drop into both eyes 2 (two) times daily. Patient not taking: Reported on 06/28/2023 04/01/20   [provider]  HYDROcodone-acetaminophen (NORCO/VICODIN) 5-325 MG tablet Take 1 tablet by mouth every 6 (six) hours as needed for moderate pain. Patient not taking: Reported on 06/28/2023 04/20/23 04/19/24  Altariq Goodall Ali, PA-C  ketorolac (ACULAR) 0.5 % ophthalmic solution Place 1 drop into the left eye 4 (four) times daily. Patient not taking: Reported on 06/28/2023 02/26/23   [provider]  Probiotic Product (4X PROBIOTIC) TABS  03/28/18   [provider]  promethazine (PHENERGAN) 12.5 MG tablet Take 12.5 mg by mouth every 4 (four) hours as needed. Patient not taking: Reported on 06/28/2023    [provider]  Rimegepant Sulfate (NURTEC) 75 MG TBDP   07/01/20   [provider]    Current Facility-Administered Medications  Medication Dose Route Frequency Provider Last Rate Last Admin   0.9 % NaCl with KCl 40 mEq / L  infusion   Intravenous Continuous Mansy, Jan A, MD 100 mL/hr at 06/28/23 1200 New Bag at 06/28/23 1200   acetaminophen (TYLENOL) tablet 650 mg  650 mg Oral Q6H PRN Mansy, Jan A, MD       Or   acetaminophen (TYLENOL) suppository 650 mg  650 mg Rectal Q6H PRN Mansy, Jan A, MD       albuterol (PROVENTIL) (2.5 MG/3ML) 0.083% nebulizer solution 2.5 mg  2.5 mg Nebulization Q6H PRN Otelia Sergeant, RPH       ALPRAZolam Prudy Feeler) tablet 0.25 mg  0.25 mg Oral BID PRN Mansy, Jan A, MD       dicyclomine (BENTYL) capsule 10 mg  10 mg Oral TID AC Mansy, Jan A, MD   10 mg at 06/28/23 1142   ferrous sulfate tablet 325 mg  325 mg Oral Daily Mansy, Jan A, MD   325 mg at 06/28/23 1143   HYDROcodone-acetaminophen (NORCO/VICODIN) 5-325 MG per tablet 1 tablet  1 tablet Oral Q6H PRN Mansy, Jan A, MD   1 tablet at 06/28/23 1727   isosorbide mononitrate (IMDUR) 24 hr tablet 120 mg  120 mg Oral BID Mansy, Jan A, MD   120 mg at 06/28/23 1143   levothyroxine (SYNTHROID) tablet 88 mcg  88 mcg Oral Q0600 Mansy, Jan A, MD   88 mcg at 06/28/23 0842   lidocaine (LIDODERM) 5 % 1 patch  1 patch Transdermal Q12H Mansy, Jan A, MD   1 patch at 06/28/23 1145   magnesium sulfate IVPB 2 g 50 mL  2 g Intravenous Once Wouk, Wilfred Curtis, MD       midodrine (PROAMATINE) tablet 5 mg  5 mg Oral TID WC Mansy, Jan A, MD   5 mg at 06/28/23 1815   mometasone-formoterol (DULERA) 200-5 MCG/ACT inhaler 2 puff  2 puff Inhalation BID Mansy, Jan A, MD   2 puff at 06/28/23 1151   montelukast (SINGULAIR) tablet 10 mg  10 mg Oral QHS Mansy, Jan A, MD       ondansetron [facility]) tablet 4 mg  4 mg Oral Q6H PRN Mansy, Jan A, MD       Or   ondansetron [facility]) injection 4 mg  4 mg Intravenous Q6H PRN Mansy, Jan A, MD       pantoprazole (PROTONIX) injection 40 mg  40 mg Intravenous  Q12H Wouk, Wilfred Curtis, MD   40 mg at 06/28/23 1145   ranolazine (RANEXA) 12 hr tablet 500 mg  500 mg Oral BID Mansy, Jan A, MD   500 mg at 06/28/23 1141   traZODone (DESYREL) tablet 100 mg  100 mg Oral QHS Mansy, Jan A, MD       traZODone (DESYREL) tablet 25 mg  25 mg Oral QHS PRN Mansy, Jan A, MD       vancomycin (VANCOCIN) capsule 125 mg  125 mg Oral QID Barrie Folk, RPH   125 mg at 06/28/23 1436    Allergies as of 06/27/2023 - Review Complete 06/27/2023  Allergen Reaction Noted   Nausea control [emetrol] Nausea And Vomiting 06/19/2017   Other  01/09/2017   Effexor [venlafaxine] Hives, Itching, and Rash 08/21/2022   Hydroxyzine Rash and Hives 12/20/2018     Review of Systems:    All systems reviewed and negative except where noted in HPI.  Review of Systems  Constitutional:  Negative for chills and fever.  Respiratory:  Negative for shortness of breath.   Cardiovascular:  Negative for chest pain.  Gastrointestinal:  Positive for abdominal pain and diarrhea.  Skin:  Negative for rash.  Neurological:  Negative for focal weakness.  Psychiatric/Behavioral:  Negative for substance abuse.   All other systems reviewed and are negative.      Physical Exam:  Vital signs in last 24 hours: Temp:  [98 F (36.7 C)-99.8 F (37.7 C)] 99.8 F (37.7 C) (12/12 1800) Pulse Rate:  [60-77] 71 (12/12 1800) Resp:  [9-21] 18 (12/12 1800) BP: (98-126)/(60-88) 110/60 (12/12 1800) SpO2:  [91 %-100 %] 99 % (12/12 1800) Last BM Date : 06/28/23 General:   In NAD Head:  Normocephalic and atraumatic. Eyes:   No icterus.   Conjunctiva pink. Mouth: Mucosa pink moist, no lesions. Neck:  Supple; no masses felt Lungs:  No respiratory distress  Abdomen:   Flat, soft, nondistended, nontender Msk: Symmetrical without gross deformities. Neurologic:  No focal deficits Skin:  Warm, dry, pink without significant lesions or rashes. Psych:  Alert and cooperative. Normal affect.  LAB  RESULTS: Recent Labs    06/27/23 2104 06/28/23 0632 06/28/23 0634  WBC 8.5 10.3 9.9  HGB 9.8* 8.4* 8.4*  HCT 28.0* 22.9* 23.3*  PLT 310 266 256   BMET Recent Labs    06/27/23 2019 06/28/23 0634  NA 130* 132*  K 2.1* 2.6*  CL 93* 99  CO2 22 23  GLUCOSE 101* 69*  BUN 27* 25*  CREATININE 1.91* 1.79*  CALCIUM 8.2* 7.7*   LFT Recent Labs    06/27/23 2019  PROT 7.2  ALBUMIN 3.3*  AST 99*  ALT 57*  ALKPHOS 123  BILITOT 1.9*   PT/INR Recent Labs    06/27/23 2019  LABPROT 13.7  INR 1.0    STUDIES: CT ABDOMEN PELVIS WO CONTRAST Result Date: 06/27/2023 CLINICAL DATA:  Weakness, blood in stool for 2 months, abdominal pain, reported history of gastric cancer EXAM: CT ABDOMEN AND PELVIS WITHOUT CONTRAST TECHNIQUE: Multidetector CT imaging of the abdomen and  pelvis was performed following the standard protocol without IV contrast. Unenhanced CT was performed per clinician order. Lack of IV contrast limits sensitivity and specificity, especially for evaluation of abdominal/pelvic solid viscera. RADIATION DOSE REDUCTION: This exam was performed according to the departmental dose-optimization program which includes automated exposure control, adjustment of the mA and/or kV according to patient size and/or use of iterative reconstruction technique. COMPARISON:  05/22/2023 FINDINGS: Lower chest: Stable right middle lobe scarring. No acute pleural or parenchymal lung disease. Decreased attenuation of the cardiac chambers may reflect underlying anemia. Hepatobiliary: Unremarkable unenhanced appearance of the liver and gallbladder. Pancreas: Unremarkable unenhanced appearance. Spleen: Unremarkable unenhanced appearance. Adrenals/Urinary Tract: The adrenals are unremarkable. No urinary tract calculi or obstructive uropathy within either kidney. Bladder is unremarkable. Stomach/Bowel: No bowel obstruction or ileus. Scattered diverticulosis of the descending and sigmoid colon. There is long  segment circumferential wall thickening extending from the distal descending colon through the rectum, compatible with inflammatory or infectious colitis. Marked perirectal and presacral fat stranding. Stable hiatal hernia. Vascular/Lymphatic: Aortic atherosclerosis. No enlarged abdominal or pelvic lymph nodes. Reproductive: Uterus and bilateral adnexa are unremarkable. Other: Trace pelvic free fluid. No free intraperitoneal gas. No abdominal wall hernia. Musculoskeletal: No acute or destructive bony abnormalities. Reconstructed images demonstrate no additional findings. IMPRESSION: 1. Wall thickening of the distal descending colon through the rectum, compatible with inflammatory or infectious colitis. 2. Trace pelvic free fluid and perirectal fat stranding, compatible with reactive changes. 3.  Aortic Atherosclerosis (ICD10-I70.0). 4. Decreased attenuation of the cardiac chambers, which may reflect underlying anemia. Electronically Signed   By: Sharlet Salina M.D.   On: 06/27/2023 22:39   DG Chest Port 1 View Result Date: 06/27/2023 CLINICAL DATA:  Clinical concern for sepsis.  Ex-smoker. EXAM: PORTABLE CHEST 1 VIEW COMPARISON:  05/22/2023 FINDINGS: Normal sized heart. Partially calcified thoracic aorta. Clear lungs with normal vascularity. Stable mild dextroconvex thoracic scoliosis. IMPRESSION: No acute abnormality.  No evidence of pneumonia. Electronically Signed   By: Beckie Salts M.D.   On: 06/27/2023 21:03       Impression / Plan:   72 y.o. lady with history of breast cancer, hypothyroidsim, CAD, and IBS-C who presents with diarrhea and diagnosed with c. Diff infection. We were consulted for her 50 pound weight loss.  - EGD/Colonoscopy could be performed as an inpatient but given several electrolyte abnormalities and the patient's colitis could obscure lesions, would ideally perform them as an outpatient after adequate treatment of her c. Diff - she has f/u scheduled with her primary  gastroenterologist - treatment of c. Diff per primary team - We will follow peripherally, please call with any questions or concerns.  Merlyn Lot MD, MPH [facility] GI

## 2023-06-28 NOTE — Assessment & Plan Note (Addendum)
Patient admitted with cramps, watery diarrhea.  C. difficile positive.  Stool stool frequent, but much urine consistency, improving. - Continue oral vancomycin - Hold Lasix until stools slowed down

## 2023-06-28 NOTE — Assessment & Plan Note (Addendum)
Mild. - Hold IV fluids, Lasix

## 2023-06-28 NOTE — Assessment & Plan Note (Addendum)
-   Continue aspirin, Plavix, Ranexa, Imdur -Hold Lipitor

## 2023-06-28 NOTE — Evaluation (Signed)
Physical Therapy Evaluation Patient Details Name: Abigail Ortega MRN: [id number] DOB: [birth year] Today's Date: 06/28/2023  History of Present Illness  Abigail Ortega is a 72 y.o. female with medical history significant for CHF, anxiety, depression, GERD, CKD, hypertension IBS, hypothyroidism and IBS, who presented to the emergency room with acute onset of diarrhea for the last 3 weeks with associated nausea and vomiting as well as left lower quadrant abdominal pain and occasional back pain. Patient with Cdiff colitis.  Clinical Impression  Patient received in bed, on stretcher. She reports she has been so very weak and essentially bed bound with son or daughter assisting her with needs. Patient reporting abdominal pain. She also reports left wrist fracture from fall. (Looked in chart and this seems to have occurred in August. Patient has splint in room for it. And says she has not used that arm since.)Patient requires total assist for bed mobility at this time. Further mobility not attempted. She will continue to benefit from skilled PT to improve strength and functional independence.          If plan is discharge home, recommend the following: Two people to help with walking and/or transfers;A lot of help with bathing/dressing/bathroom   Can travel by private vehicle   No    Equipment Recommendations None recommended by PT  Recommendations for Other Services       Functional Status Assessment Patient has had a recent decline in their functional status and demonstrates the ability to make significant improvements in function in a reasonable and predictable amount of time.     Precautions / Restrictions Precautions Precautions: Fall Restrictions Weight Bearing Restrictions Per Provider Order: No      Mobility  Bed Mobility Overal bed mobility: Needs Assistance Bed Mobility: Rolling, Supine to Sit, Sit to Supine Rolling: +2 for physical assistance, Total assist   Supine  to sit: Total assist, +2 for physical assistance Sit to supine: Total assist, +2 for physical assistance        Transfers                   General transfer comment: did not attempt    Ambulation/Gait               General Gait Details: did not attempt  Stairs            Wheelchair Mobility     Tilt Bed    Modified Rankin (Stroke Patients Only)       Balance                                             Pertinent Vitals/Pain Pain Assessment Pain Assessment: Faces Faces Pain Scale: Hurts whole lot Pain Location: abdomen Pain Descriptors / Indicators: Discomfort, Cramping, Shooting Pain Intervention(s): Monitored during session    Home Living Family/patient expects to be discharged to:: Private residence Living Arrangements: Alone;Children Available Help at Discharge: Family;Available 24 hours/day Type of Home: House           Home Equipment: Agricultural consultant (2 wheels)      Prior Function Prior Level of Function : Needs assist       Physical Assist : Mobility (physical);ADLs (physical) Mobility (physical): Bed mobility;Transfers;Gait ADLs (physical): Grooming;Bathing;Dressing;Toileting Mobility Comments: patient reports she has had limited mobility lately. Has essentially been bed bound with son or daughter providing care. Patient  reports she lives with her sister. Would walk short distance with walker to bathroom with assist until she couldnt anymore. ADLs Comments: Son daughter have been providing care. No family at bedside to confirm.     Extremity/Trunk Assessment   Upper Extremity Assessment Upper Extremity Assessment: Defer to OT evaluation;LUE deficits/detail LUE Deficits / Details: patient reports her left wrist is broke from fall. ( this occured in August. patient has splint in room) She apparently has not been moving or using that wrist or arm since. LUE: Unable to fully assess due to immobilization     Lower Extremity Assessment Lower Extremity Assessment: Generalized weakness       Communication   Communication Communication: No apparent difficulties Cueing Techniques: Verbal cues  Cognition Arousal: Alert Behavior During Therapy: WFL for tasks assessed/performed Overall Cognitive Status: Within Functional Limits for tasks assessed                                          General Comments      Exercises     Assessment/Plan    PT Assessment Patient needs continued PT services  PT Problem List Decreased strength;Decreased range of motion;Decreased activity tolerance;Decreased balance;Decreased mobility;Pain       PT Treatment Interventions Functional mobility training;Therapeutic activities;Therapeutic exercise;Balance training;Neuromuscular re-education;Cognitive remediation;Patient/family education;Gait training;DME instruction    PT Goals (Current goals can be found in the Care Plan section)  Acute Rehab PT Goals Patient Stated Goal: to improve, decrease pain PT Goal Formulation: With patient Time For Goal Achievement: 07/12/23 Potential to Achieve Goals: Fair    Frequency Min 1X/week     Co-evaluation               AM-PAC PT "6 Clicks" Mobility  Outcome Measure Help needed turning from your back to your side while in a flat bed without using bedrails?: Total Help needed moving from lying on your back to sitting on the side of a flat bed without using bedrails?: Total Help needed moving to and from a bed to a chair (including a wheelchair)?: Total Help needed standing up from a chair using your arms (e.g., wheelchair or bedside chair)?: Total Help needed to walk in hospital room?: Total Help needed climbing 3-5 steps with a railing? : Total 6 Click Score: 6    End of Session   Activity Tolerance: Patient limited by lethargy;Other (comment) (weakness) Patient left: in bed Nurse Communication: Mobility status PT Visit Diagnosis:  Other abnormalities of gait and mobility (R26.89);Muscle weakness (generalized) (M62.81);History of falling (Z91.81)    Time: 0981-1914 PT Time Calculation (min) (ACUTE ONLY): 14 min   Charges:   PT Evaluation $PT Eval Moderate Complexity: 1 Mod   PT General Charges $$ ACUTE PT VISIT: 1 Visit         Keyona Emrich, PT, GCS 06/28/23,3:17 PM

## 2023-06-28 NOTE — Progress Notes (Signed)
PHARMACIST - PHYSICIAN COMMUNICATION  CONCERNING:  Enoxaparin (Lovenox) for DVT Prophylaxis    RECOMMENDATION: Patient was prescribed enoxaprin 40mg  q24 hours for VTE prophylaxis.   There were no vitals filed for this visit.  There is no height or weight on file to calculate BMI.  CrCl cannot be calculated (Unknown ideal weight.).  Patient is candidate for enoxaparin 30mg  every 24 hours based on CrCl <12ml/min or Weight <45kg  DESCRIPTION: Pharmacy has adjusted enoxaparin dose per [facility] policy.  Patient is now receiving enoxaparin 30 mg every 24 hours   Otelia Sergeant, PharmD, [facility] 06/28/2023 5:51 AM

## 2023-06-28 NOTE — Evaluation (Addendum)
Clinical/Bedside Swallow Evaluation Patient Details  Name: Abigail Ortega MRN: [id number] Date of Birth: 09-06-1950  Today's Date: 06/28/2023 Time: SLP Start Time (ACUTE ONLY): 1235 SLP Stop Time (ACUTE ONLY): 1320 SLP Time Calculation (min) (ACUTE ONLY): 45 min  Past Medical History:  Past Medical History:  Diagnosis Date   Anxiety    Arthritis    Breast cancer (HCC) 2014   Right breast cancer - chemo, radiation and Mastectomy   CHF (congestive heart failure) (HCC) 2013   Chronic kidney disease    Clotting disorder (HCC)    blood clots   Depression    GERD (gastroesophageal reflux disease)    GI bleed    H/O heart artery stent    Headache    Heart attack (HCC) 2012   coronary stent x2 completed by Dr. Juliann Pares.   Heart attack (HCC) 04/29/2013   Hemorrhoids    Hypertension    Hypothyroidism    IBS (irritable bowel syndrome)    Malignant neoplasm of lower-inner quadrant of female breast (HCC) 01/20/2013   invasive mammary cancer, minimal 0.85 cm.  Histologic grade 1.   Personal history of chemotherapy 2014   BREAST CA   Personal history of radiation therapy 2014   BREAST CA   Thyroid disease    Past Surgical History:  Past Surgical History:  Procedure Laterality Date   BOTOX INJECTION N/A 03/14/2017   Procedure: BOTOX INJECTION;  Surgeon: Leafy Ro, MD;  Location: [facility] ORS;  Service: General;  Laterality: N/A;   BOTOX INJECTION N/A 05/09/2018   Procedure: BOTOX INJECTION;  Surgeon: Leafy Ro, MD;  Location: [facility] ORS;  Service: General;  Laterality: N/A;   BREAST BIOPSY Right 2014   positive   BREAST SURGERY Right 02-21-2013   right mastectomy   CARDIAC CATHETERIZATION     COLONOSCOPY W/ BIOPSIES  09/15/2010   Colonoscopy completed by Lutricia Feil, M.D. Tubular adenoma of the ascending colon and descending colon reported up to 0.4 cm in diameter. No atypia.   COLONOSCOPY WITH PROPOFOL N/A 12/23/2016   Procedure: COLONOSCOPY WITH PROPOFOL;  Surgeon: Charlott Rakes, MD;  Location: [facility] ENDOSCOPY;  Service: Endoscopy;  Laterality: N/A;   ESOPHAGOGASTRODUODENOSCOPY N/A 12/13/2016   Procedure: ESOPHAGOGASTRODUODENOSCOPY (EGD);  Surgeon: Wyline Mood, MD;  Location: [facility] ENDOSCOPY;  Service: Endoscopy;  Laterality: N/A;   LEFT HEART CATH N/A 04/02/2020   Procedure: Left Heart Cath with Coronary Angiography;  Surgeon: Laurier Nancy, MD;  Location: [facility] INVASIVE CV LAB;  Service: Cardiovascular;  Laterality: N/A;   LEFT HEART CATH AND CORONARY ANGIOGRAPHY Left 08/29/2018   Procedure: LEFT HEART CATH AND CORONARY ANGIOGRAPHY;  Surgeon: Laurier Nancy, MD;  Location: [facility] INVASIVE CV LAB;  Service: Cardiovascular;  Laterality: Left;   MASTECTOMY Right 2014   BREAST CA   PORTA CATH INSERTION     SPHINCTEROTOMY N/A 05/09/2018   Procedure: SPHINCTEROTOMY;  Surgeon: Leafy Ro, MD;  Location: [facility] ORS;  Service: General;  Laterality: N/A;   UPPER GI ENDOSCOPY  09/15/2010     Completed by Lutricia Feil, M.D. for nausea. Normal exam reported.   VASCULAR SURGERY     HPI:  Pt is a 73 y.o. female with medical history significant for CHF, anxiety, depression, Hiatal Hernia per Imaging in chart, GERD, CKD, hypertension IBS, hypothyroidism and IBS, who presented to the emergency room with acute onset of diarrhea for the last 3 weeks with associated nausea and vomiting as well as left lower quadrant abdominal pain and occasional back  pain.  Patient with Cdiff colitis.   CXR: No acute abnormality.  No evidence of pneumonia.    Assessment / Plan / Recommendation  Clinical Impression   Pt seen for BSE today. Pt awake, alert. Verbal. She was talkative describing her issues w/ IBS. Pt is Edentulous b/c her Dentures are "broken". Pt c/o L wrist discomfort, "I broke it" -- NSG updated. On RA, afebrile. WBC WNL.  Pt appears to present w/ functional oropharyngeal phase swallowing w/ No oropharyngeal phase dysphagia noted, No neuromuscular deficits noted. Pt consumed po trials  w/ No overt, clinical s/s of aspiration during po trials.  Pt appears at reduced risk for aspiration following general aspiration precautions. Pt does c/o IBS, has a Hiatal Hernia, and along w/ s/s of Reflux "at times"(baseline GERD). These factors can increase risk for REFLUX aspiration, dysphagia as well as decreased oral intake overall.  During po trials, pt consumed all consistencies w/ no overt coughing, decline in vocal quality, or change in respiratory presentation during/post trials. O2 sats 100%. Oral phase appeared grossly [facility] w/ timely bolus management, mashing/gumming, and control of bolus propulsion for A-P transfer for swallowing. Oral clearing achieved w/ all trial consistencies -- moistened, soft foods given.  OM Exam appeared [facility] w/ no unilateral weakness noted. Speech Clear. Pt fed self w/ setup support.   Recommend continue a more mech soft consistency diet w/ well-Cut meats, moistened foods; Thin liquids. Recommend soft easy to chew foods d/t Edentulous status. Recommend general aspiration and REFLUX precautions, Pills WHOLE in Puree for safer, easier swallowing IF any difficulty swallowing w/ Water.  Education given on Pills in Puree; food consistencies and easy to eat options; general aspiration precautions to pt and NSG. MD/NSG updated and agreed. MD to reconsult if any new needs arise. Recommend Dietician f/u for support. SLP Visit Diagnosis: Dysphagia, unspecified (R13.10) (pt c/o IBS discomfort baseline)    Aspiration Risk   (reduced following general aspiration precautions)    Diet Recommendation   Thin;Dysphagia 3 (mechanical soft) (soft, moist foods d/t Edentulous status) = soft easy to chew foods d/t Edentulous status. Recommend general aspiration and REFLUX precautions  Medication Administration: Whole meds with liquid (vs in a Puree)    Other  Recommendations Recommended Consults:  (Dietician f/u) Oral Care Recommendations: Oral care BID;Patient independent with oral  care    Recommendations for follow up therapy are one component of a multi-disciplinary discharge planning process, led by the attending physician.  Recommendations may be updated based on patient status, additional functional criteria and insurance authorization.  Follow up Recommendations No SLP follow up      Assistance Recommended at Discharge  intermittent  Functional Status Assessment Patient has not had a recent decline in their functional status  Frequency and Duration  (n/a)   (n/a)       Prognosis Prognosis for improved oropharyngeal function: Good Barriers to Reach Goals: Time post onset;Severity of deficits Barriers/Prognosis Comment: IBS; Edentulous      Swallow Study   General Date of Onset: 06/27/23 HPI: Pt is a 72 y.o. female with medical history significant for CHF, anxiety, depression, GERD, CKD, hypertension IBS, hypothyroidism and IBS, who presented to the emergency room with acute onset of diarrhea for the last 3 weeks with associated nausea and vomiting as well as left lower quadrant abdominal pain and occasional back pain.  Patient with Cdiff colitis.   CXR: No acute abnormality.  No evidence of pneumonia. Type of Study: Bedside Swallow Evaluation Previous Swallow Assessment: none  Diet Prior to this Study: Dysphagia 3 (mechanical soft);Thin liquids (Level 0) Temperature Spikes Noted: No (wbc 9.9) Respiratory Status: Room air History of Recent Intubation: No Behavior/Cognition: Alert;Cooperative;Pleasant mood Oral Cavity Assessment: Within Functional Limits Oral Care Completed by SLP: Yes Oral Cavity - Dentition: Edentulous Vision: Functional for self-feeding Self-Feeding Abilities: Able to feed self;Needs set up Patient Positioning: Upright in bed (supported) Baseline Vocal Quality: Normal Volitional Cough: Strong Volitional Swallow: Able to elicit    Oral/Motor/Sensory Function Overall Oral Motor/Sensory Function: Within functional limits   Ice Chips  Ice chips: Within functional limits Presentation: Spoon (fed; 2 trials)   Thin Liquid Thin Liquid: Within functional limits Presentation: Cup;Self Fed;Straw (8 trials total)    Nectar Thick Nectar Thick Liquid: Not tested   Honey Thick Honey Thick Liquid: Not tested   Puree Puree: Within functional limits Presentation: Self Fed;Spoon (3 trials)   Solid     Solid: Within functional limits (softened - mech soft) Presentation: Spoon;Self Fed (1 trial) Other Comments: did not want anything further d/t her N/V feelings        Jerilynn Som, MS, CCC-SLP Speech Language Pathologist Rehab Services; [facility] - [place] [phone] (ascom) Sammi Stolarz 06/28/2023,4:31 PM

## 2023-06-29 DIAGNOSIS — K529 Noninfective gastroenteritis and colitis, unspecified: Secondary | ICD-10-CM | POA: Diagnosis not present

## 2023-06-29 LAB — BASIC METABOLIC PANEL
Anion gap: 6 (ref 5–15)
BUN: 22 mg/dL (ref 8–23)
CO2: 20 mmol/L — ABNORMAL LOW (ref 22–32)
Calcium: 7.4 mg/dL — ABNORMAL LOW (ref 8.9–10.3)
Chloride: 109 mmol/L (ref 98–111)
Creatinine, Ser: 1.66 mg/dL — ABNORMAL HIGH (ref 0.44–1.00)
GFR, Estimated: 32 mL/min — ABNORMAL LOW (ref >60)
Glucose, Bld: 72 mg/dL (ref 70–99)
Potassium: 3.4 mmol/L — ABNORMAL LOW (ref 3.5–5.1)
Sodium: 135 mmol/L (ref 135–145)

## 2023-06-29 LAB — CBC
HCT: 18.8 % — ABNORMAL LOW (ref 36.0–46.0)
Hemoglobin: 6.4 g/dL — ABNORMAL LOW (ref 12.0–15.0)
MCH: 32.3 pg (ref 26.0–34.0)
MCHC: 34 g/dL (ref 30.0–36.0)
MCV: 94.9 fL (ref 80.0–100.0)
Platelets: 235 10*3/uL (ref 150–400)
RBC: 1.98 MIL/uL — ABNORMAL LOW (ref 3.87–5.11)
RDW: 16.3 % — ABNORMAL HIGH (ref 11.5–15.5)
WBC: 6.3 10*3/uL (ref 4.0–10.5)
nRBC: 0 % (ref 0.0–0.2)

## 2023-06-29 LAB — PREPARE RBC (CROSSMATCH)

## 2023-06-29 LAB — MAGNESIUM: Magnesium: 2.1 mg/dL (ref 1.7–2.4)

## 2023-06-29 MED ORDER — SODIUM CHLORIDE 0.9% IV SOLUTION: Freq: Once | INTRAVENOUS | Status: AC

## 2023-06-29 MED ORDER — ADULT MULTIVITAMIN W/MINERALS CH
1.0000 | ORAL_TABLET | Freq: Every day | ORAL | Status: DC
  Administered 2023-06-29 – 2023-07-09 (×11): 1 via ORAL
  Filled 2023-06-29 (×11): qty 1

## 2023-06-29 MED ORDER — BOOST / RESOURCE BREEZE PO LIQD CUSTOM
1.0000 | Freq: Three times a day (TID) | ORAL | Status: DC
  Administered 2023-06-29 – 2023-07-09 (×25): 1 via ORAL

## 2023-06-29 MED ORDER — POTASSIUM CHLORIDE 20 MEQ PO PACK
40.0000 meq | PACK | Freq: Once | ORAL | Status: AC
  Administered 2023-06-29: 40 meq via ORAL
  Filled 2023-06-29: qty 2

## 2023-06-29 NOTE — Progress Notes (Signed)
Physical Therapy Treatment Patient Details Name: Abigail Ortega MRN: [id number] DOB: Aug 21, 1950 Today's Date: 06/29/2023   History of Present Illness Abigail Ortega is a 73 y.o. female with medical history significant for CHF, anxiety, depression, GERD, CKD, hypertension IBS, hypothyroidism and IBS, who presented to the emergency room with acute onset of diarrhea for the last 3 weeks with associated nausea and vomiting as well as left lower quadrant abdominal pain and occasional back pain. Patient with Cdiff colitis.    PT Comments  Patient received in bed, RN in room. Patient is agreeable to PT/OT session. She is a bit difficult to get clear story from and no family present. She has L wrist splint in room from wrist fracture back in August. She does not really use L UE. Patient is cga for bed mobility. Mod +2 A for sit to stand for a brief time. Patient is unable to take any steps. Patient will continue to benefit from skilled PT to improve independence, safety and strength.       If plan is discharge home, recommend the following: A lot of help with bathing/dressing/bathroom;Assistance with cooking/housework;Direct supervision/assist for medications management;Help with stairs or ramp for entrance   Can travel by private vehicle        Equipment Recommendations  None recommended by PT    Recommendations for Other Services       Precautions / Restrictions Precautions Precautions: Fall Required Braces or Orthoses: Splint/Cast Splint/Cast: has splint in room for L wrist. Not sure what purpose. Restrictions Weight Bearing Restrictions Per Provider Order: No     Mobility  Bed Mobility Overal bed mobility: Needs Assistance Bed Mobility: Supine to Sit, Sit to Supine Rolling: Contact guard assist, Used rails   Supine to sit: Contact guard, Used rails Sit to supine: Contact guard assist, Used rails   General bed mobility comments: increased time and cues needed     Transfers Overall transfer level: Needs assistance Equipment used: 2 person hand held assist Transfers: Sit to/from Stand Sit to Stand: +2 physical assistance, Mod assist           General transfer comment: patient with decreased initiation to stand.    Ambulation/Gait               General Gait Details: unable to take any steps. Limited standing time.   Stairs             Wheelchair Mobility     Tilt Bed    Modified Rankin (Stroke Patients Only)       Balance Overall balance assessment: Needs assistance Sitting-balance support: No upper extremity supported, Feet supported Sitting balance-Leahy Scale: Good     Standing balance support: Bilateral upper extremity supported Standing balance-Leahy Scale: Fair                              Cognition Arousal: Alert Behavior During Therapy: WFL for tasks assessed/performed Overall Cognitive Status: No family/caregiver present to determine baseline cognitive functioning                                          Exercises      General Comments        Pertinent Vitals/Pain Pain Assessment Pain Assessment: Faces Faces Pain Scale: Hurts a little bit Pain Location: Back, L UE Pain Descriptors /  Indicators: Grimacing, Guarding, Sore Pain Intervention(s): Monitored during session, Repositioned    Home Living                          Prior Function            PT Goals (current goals can now be found in the care plan section) Acute Rehab PT Goals Patient Stated Goal: to improve, decrease pain PT Goal Formulation: With patient Time For Goal Achievement: 07/12/23 Potential to Achieve Goals: Fair Progress towards PT goals: Progressing toward goals    Frequency    Min 1X/week      PT Plan      Co-evaluation PT/OT/SLP Co-Evaluation/Treatment: Yes Reason for Co-Treatment: To address functional/ADL transfers;Necessary to address cognition/behavior  during functional activity;For patient/therapist safety PT goals addressed during session: Mobility/safety with mobility;Balance        AM-PAC PT "6 Clicks" Mobility   Outcome Measure  Help needed turning from your back to your side while in a flat bed without using bedrails?: A Little Help needed moving from lying on your back to sitting on the side of a flat bed without using bedrails?: A Little Help needed moving to and from a bed to a chair (including a wheelchair)?: A Lot Help needed standing up from a chair using your arms (e.g., wheelchair or bedside chair)?: A Lot Help needed to walk in hospital room?: Total Help needed climbing 3-5 steps with a railing? : Total 6 Click Score: 12    End of Session   Activity Tolerance: Other (comment) (weakness) Patient left: in bed;with call bell/phone within reach;with bed alarm set Nurse Communication: Mobility status PT Visit Diagnosis: Other abnormalities of gait and mobility (R26.89);Muscle weakness (generalized) (M62.81);History of falling (Z91.81);Difficulty in walking, not elsewhere classified (R26.2);Unsteadiness on feet (R26.81);Pain Pain - Right/Left: Left Pain - part of body: Arm     Time: 2130-8657 PT Time Calculation (min) (ACUTE ONLY): 27 min  Charges:    $Therapeutic Activity: 8-22 mins PT General Charges $$ ACUTE PT VISIT: 1 Visit                     Vianey Caniglia, PT, GCS 06/29/23,9:52 AM

## 2023-06-29 NOTE — Evaluation (Signed)
Occupational Therapy Evaluation Patient Details Name: Abigail Ortega MRN: [id number] DOB: March 22, 1951 Today's Date: 06/29/2023   History of Present Illness Abigail Ortega is a 72 y.o. female with medical history significant for CHF, anxiety, depression, GERD, CKD, hypertension IBS, hypothyroidism and IBS, who presented to the emergency room with acute onset of diarrhea for the last 3 weeks with associated nausea and vomiting as well as left lower quadrant abdominal pain and occasional back pain. Patient with Cdiff colitis.   Clinical Impression   Pt was seen for PT/OT evaluation this date. Prior to hospital admission, pt lived with her sister and had children assisting as needed. Prior to about a month ago, she was able to walk with RW, but has since essentially been bed-bound. Last oupatient OT visit at [facility] on 06/05/23 for L wrist AROM and custom splint.  Pt presents to acute OT demonstrating impaired ADL performance and functional mobility 2/2 weakness, pain, low activity tolerance and balance deficits (See OT problem list for additional functional deficits). Pt currently requires CGA for all bed mobility. Mod A x2 via HHA for STS from EOB with little initiation by pt. Very limited use of her LUE throughout session. LUE ROM WFL, however pt does not seem to be using it/moving it. Pain with ROM to L wrist with noted deformity. Has splint in room that she says she wears sometimes. Edu on AROM exercises to be performing daily. Good seated balance at EOB noted. Less than 30 seconds standing tolerance.  Pt would benefit from skilled OT services to address noted impairments and functional limitations (see below for any additional details) in order to maximize safety and independence while minimizing falls risk and caregiver burden. Do anticipate the need for follow up OT services upon acute hospital DC.        If plan is discharge home, recommend the following: Two people to help with walking  and/or transfers;A lot of help with bathing/dressing/bathroom;Assistance with cooking/housework;Help with stairs or ramp for entrance;Assist for transportation    Functional Status Assessment  Patient has had a recent decline in their functional status and demonstrates the ability to make significant improvements in function in a reasonable and predictable amount of time.  Equipment Recommendations  Other (comment) (defer)    Recommendations for Other Services       Precautions / Restrictions Precautions Precautions: Fall Required Braces or Orthoses: Splint/Cast Splint/Cast: has splint in room for L wrist-unsure of purpose, was going to outpatient at [facility] for AROM/stretching and splint made there Restrictions Weight Bearing Restrictions Per Provider Order: No      Mobility Bed Mobility Overal bed mobility: Needs Assistance Bed Mobility: Supine to Sit, Sit to Supine Rolling: Contact guard assist, Used rails   Supine to sit: Contact guard, Used rails Sit to supine: Contact guard assist, Used rails   General bed mobility comments: increased time and cues needed; no physical assist provided    Transfers Overall transfer level: Needs assistance Equipment used: 2 person hand held assist Transfers: Sit to/from Stand Sit to Stand: +2 physical assistance, Mod assist           General transfer comment: Mod A x2 for STS from EOB with little initiation by pt      Balance Overall balance assessment: Needs assistance Sitting-balance support: No upper extremity supported, Feet supported Sitting balance-Leahy Scale: Good Sitting balance - Comments: steady seated balance without UE support at EOB taking meds with nurse   Standing balance support: Bilateral upper extremity supported  Standing balance-Leahy Scale: Fair Standing balance comment: Mod A x2 to stand for ~30 seconds                           ADL either performed or assessed with clinical judgement   ADL  Overall ADL's : Needs assistance/impaired Eating/Feeding: Set up;Bed level                                     General ADL Comments: pt likely to need Mod/Max A for ADLs d/t limited LUE use     Vision         Perception         Praxis         Pertinent Vitals/Pain Pain Assessment Pain Assessment: Faces Faces Pain Scale: Hurts a little bit Pain Location: Back, L UE Pain Descriptors / Indicators: Grimacing, Guarding, Sore Pain Intervention(s): Monitored during session     Extremity/Trunk Assessment             Communication Communication Communication: Difficulty communicating thoughts/reduced clarity of speech Cueing Techniques: Verbal cues;Gestural cues   Cognition Arousal: Alert Behavior During Therapy: WFL for tasks assessed/performed Overall Cognitive Status: No family/caregiver present to determine baseline cognitive functioning                                       General Comments       Exercises Other Exercises Other Exercises: Edu in role of acute OT and importance of therapy to maximize strength and return to PLOF.   Shoulder Instructions      Home Living Family/patient expects to be discharged to:: Private residence Living Arrangements: Alone;Children Available Help at Discharge: Family;Available 24 hours/day Type of Home: House                       Home Equipment: Agricultural consultant (2 wheels)          Prior Functioning/Environment Prior Level of Function : Needs assist       Physical Assist : Mobility (physical);ADLs (physical) Mobility (physical): Bed mobility;Transfers;Gait ADLs (physical): Grooming;Bathing;Dressing;Toileting Mobility Comments: patient reports she has had limited mobility lately. Has essentially been bed bound with son or daughter providing care. Patient reports she lives with her sister. Would walk short distance with walker to bathroom with assist until she couldnt  anymore. ADLs Comments: Son daughter have been providing care. No family at bedside to confirm. Was going to outpatient OT at [facility] up until 06/05/23        OT Problem List: Decreased strength;Decreased range of motion;Increased edema;Impaired UE functional use;Impaired balance (sitting and/or standing);Decreased safety awareness;Pain      OT Treatment/Interventions: Self-care/ADL training;Therapeutic exercise;Patient/family education;Balance training;Therapeutic activities;DME and/or AE instruction    OT Goals(Current goals can be found in the care plan section) Acute Rehab OT Goals Patient Stated Goal: improve strength and overall functional use of LUE OT Goal Formulation: With patient Time For Goal Achievement: 07/13/23 Potential to Achieve Goals: Good ADL Goals Pt Will Perform Grooming: with contact guard assist;standing Pt Will Perform Lower Body Bathing: sit to/from stand;sitting/lateral leans;with min assist Pt Will Perform Lower Body Dressing: with min assist;sit to/from stand;sitting/lateral leans Pt Will Transfer to Toilet: stand pivot transfer;with min assist;bedside commode Pt Will Perform Toileting - Clothing Manipulation  and hygiene: with min assist;sit to/from stand;sitting/lateral leans Pt/caregiver will Perform Home Exercise Program: Increased ROM;Left upper extremity;With Supervision  OT Frequency: Min 1X/week    Co-evaluation PT/OT/SLP Co-Evaluation/Treatment: Yes Reason for Co-Treatment: To address functional/ADL transfers;Necessary to address cognition/behavior during functional activity;For patient/therapist safety PT goals addressed during session: Mobility/safety with mobility;Balance OT goals addressed during session: ADL's and self-care      AM-PAC OT "6 Clicks" Daily Activity     Outcome Measure Help from another person eating meals?: None Help from another person taking care of personal grooming?: A Little Help from another person toileting, which  includes using toliet, bedpan, or urinal?: A Lot Help from another person bathing (including washing, rinsing, drying)?: A Lot Help from another person to put on and taking off regular upper body clothing?: A Lot Help from another person to put on and taking off regular lower body clothing?: A Lot 6 Click Score: 15   End of Session Nurse Communication: Mobility status  Activity Tolerance: Patient tolerated treatment well Patient left: in bed;with call bell/phone within reach;with bed alarm set  OT Visit Diagnosis: Other abnormalities of gait and mobility (R26.89);Unsteadiness on feet (R26.81);Muscle weakness (generalized) (M62.81)                Time: 2956-2130 OT Time Calculation (min): 27 min Charges:  OT General Charges $OT Visit: 1 Visit OT Evaluation $OT Eval Moderate Complexity: 1 Mod Walther Sanagustin, OTR/L 06/29/23, 11:25 AM  Seanne Chirico E Lam Mccubbins 06/29/2023, 11:22 AM

## 2023-06-29 NOTE — Progress Notes (Signed)
Initial Nutrition Assessment  DOCUMENTATION CODES:   Non-severe (moderate) malnutrition in context of chronic illness  INTERVENTION:   -Obtain new wt -Continue dysphagia 3 diet -MVI with minerals daily -Boost Breeze po TID, each supplement provides 250 kcal and 9 grams of protein   NUTRITION DIAGNOSIS:   Moderate Malnutrition related to chronic illness (CHF) as evidenced by mild fat depletion, moderate fat depletion, mild muscle depletion, moderate muscle depletion.  GOAL:   Patient will meet greater than or equal to 90% of their needs  MONITOR:   PO intake, Supplement acceptance  REASON FOR ASSESSMENT:   Consult Assessment of nutrition requirement/status  ASSESSMENT:   Pt with medical history significant for CHF, anxiety, depression, GERD, CKD, hypertension IBS, hypothyroidism and IBS, who presented with acute onset of diarrhea for the last 3 weeks PTA with associated nausea and vomiting as well as left lower quadrant abdominal pain and occasional back pain.  Pt admitted with acute colitis.   12/12- s/p BSE- dysphagia 3 diet with thin liquids  Reviewed I/O's: +50 ml x 24 hours  Case discussed with SLP; plan for dysphagia 3 diet. SLP concerned that pt with food aversions secondary to IBS.    Per GI notes, pt endorses a 50# wt loss over the past year. Pt scheduled for EGD/ colonoscopy as an outpatient soon; last colonoscopy in 2018 was unremarkable. Noted CT consistent with colitis with infection. Due to diagnosis of c-diff, GI recommending deferring EGD/ colonoscopy as an outpatient.   Case discussed with RN; pt eating breakfast at time of visit and just finished taking medications without difficulty.   Spoke with pt at bedside, who sometimes answered questions appropriately. She reports feeling better today and so far she has not had any nausea or vomiting today. Pt reports decreased oral intake over the past several weeks secondary to nausea and vomiting. Pt shares  that at baseline she consumes 2 meals per day (Breakfast: oatmeal and banana, pt did not offer what she ate for second meal despite probing). Pt reports over the past few weeks her safe foods have bene gingerale and oatmeal. Pt consuming grits without difficulty and assisted her in opening her banana. She does not tolerate milk products well per her report.   Reviewed wt hx. Last wt recorded on 05/24/23. RD ordered new wt ot better assess wt trends. Reviewed wt hx; pt has experienced a 7.6% wt loss over 02/2023-05/2023, which is significant for time frame.   Discussed importance of good meal and supplement intake to promote healing. RD will trial Boost Breeze due to pt's poor tolerance of milk based products.   Medications reviewed and include protonix.   Labs reviewed: K: 3.4, CBGS: 151 (inpatient orders for glycemic control are none).    NUTRITION - FOCUSED PHYSICAL EXAM:  Flowsheet Row Most Recent Value  Orbital Region Moderate depletion  Upper Arm Region Moderate depletion  Thoracic and Lumbar Region Mild depletion  Buccal Region Mild depletion  Temple Region Mild depletion  Clavicle Bone Region Mild depletion  Clavicle and Acromion Bone Region Mild depletion  Scapular Bone Region Mild depletion  Dorsal Hand Moderate depletion  Patellar Region Mild depletion  Anterior Thigh Region Mild depletion  Posterior Calf Region Mild depletion  Edema (RD Assessment) Mild  Hair Reviewed  Eyes Reviewed  Mouth Reviewed  Skin Reviewed  Nails Reviewed       Diet Order:   Diet Order             DIET DYS 3  Room service appropriate? Yes; Fluid consistency: Thin  Diet effective now                   EDUCATION NEEDS:   Education needs have been addressed  Skin:  Skin Assessment: Reviewed RN Assessment  Last BM:  06/23/23 (type 7)  Height:   Ht Readings from Last 1 Encounters:  05/24/23 5\' 5"  (1.651 m)    Weight:   Wt Readings from Last 1 Encounters:  05/24/23 62.6 kg     Ideal Body Weight:  56.8 kg  BMI:  There is no height or weight on file to calculate BMI.  Estimated Nutritional Needs:   Kcal:  1700-1900  Protein:  90-105 grams  Fluid:  > 1.7 L    Levada Schilling, RD, LDN, CDCES Registered Dietitian III Certified Diabetes Care and Education Specialist If unable to reach this RD, please use "RD Inpatient" group chat on secure chat between hours of 8am-4 pm daily

## 2023-06-29 NOTE — Progress Notes (Signed)
PROGRESS NOTE    Abigail Ortega  ZOX:[id number] DOB: [birth year] DOA: 06/27/2023 PCP: Margaretann Loveless, MD   Brief Narrative:  This 72 yrs old female with PMH  significant for CHF, anxiety, depression, GERD, CKD, hypertension , IBS, hypothyroidism , who presented to the emergency room with acute onset of diarrhea for the last 3 weeks with associated nausea and vomiting as well as left lower quadrant abdominal pain and occasional back pain.  She denied any dysuria, oliguria or hematuria or flank pain.  No chest pain or palpitations.  No cough or wheezing.  No melena or bright red bleeding per rectum.  Admitted for further evaluation.  Assessment & Plan:   Principal Problem:   Acute colitis Active Problems:   Hypokalemia   Hyponatremia   Acute kidney injury superimposed on chronic kidney disease (HCC)   Breast cancer of lower-inner quadrant of right female breast (HCC)   Essential hypertension, benign   Hypothyroid   Hypothyroidism   Coronary artery disease   Chronic obstructive lung disease (HCC)  C. Diff + Colitis: Appears to be her first episode , She denies any recent antibiotics.  Colitis seen on CT A/P without complications Continue oral vancomycin Continue IV fluids.   Unintentional weight loss: She reports several months of poor appetite, intermittent vomiting, and 50 pounds weight loss, with anemia as well.  She has endoscopy scheduled for February. Dr. Rockie Neighbours has spoken w/ dr. Mia Creek , States C. Diff  wouldn't preclude EGD but electrolyte abnormalities would, so advises to stabilize and replete, Continue c diff treatment, can consider egd in the next few days Speech and swallow evaluation RD consult.   Normochromic Normocytic anemia: Baseline Hgb is around 10, here 9.> 8.4, No Obvious visible bleeding.  Recent eval for iron deficiency Neg, Normal b12 F/u folic acid and smear GI recommended endoscopy as an outpatient after adequate treatment for her C.  difficile. Continue IV pantoprazole. Her hemoglobin dropped to 6.4. Confirmed with patient,  She has agreed with blood transfusion. Follow-up posttransfusion hemoglobin.   Left arm swelling: Distal left radius fracture: Fracture followed by ortho, is being managed non-operatively.  Soft tissue swelling in this arm as well, quite possibly 2/2 fracture Left upper extremity venous duplex negative for DVT.   Hypokalemia/ Hypomagnesemia: Replaced.  Continue to monitor   CKD stage IIIb: Serum creatinine at baseline. Continue IV fluids. Hold Lasix for now.   Chronic hypotension: Continue midodrine.   Generalized anxiety disorder: Continue alprazolam.   Hypothyroidism: Continue levothyroxine.   CAD: History of stent years ago, 2021 LHC showed patent stent, advised treat medically. No chest pain. Continue Ranexa, Imdur.  Resume aspirin/statin.   Debility/deconditioning: PT / OT consult    DVT prophylaxis: SCDs Code Status: DNR Family Communication: No family at bed side Disposition Plan:    Status is: Observation The patient remains OBS appropriate and will d/c before 2 midnights.    Consultants:  None  Procedures: None  Antimicrobials: Anti-infectives (From admission, onward)    Start     Dose/Rate Route Frequency Ordered Stop   06/28/23 2200  cefTRIAXone (ROCEPHIN) 2 g in sodium chloride 0.9 % 100 mL IVPB  Status:  Discontinued        2 g 200 mL/hr over 30 Minutes Intravenous Every 24 hours 06/28/23 0538 06/28/23 0924   06/28/23 1000  metroNIDAZOLE (FLAGYL) IVPB 500 mg  Status:  Discontinued        500 mg 100 mL/hr over 60 Minutes Intravenous Every 8 hours  06/28/23 0538 06/28/23 0944   06/28/23 1000  vancomycin (VANCOCIN) capsule 125 mg        125 mg Oral 4 times daily 06/28/23 0943 07/08/23 0959   06/27/23 2200  ceFEPIme (MAXIPIME) 2 g in sodium chloride 0.9 % 100 mL IVPB        2 g 200 mL/hr over 30 Minutes Intravenous  Once 06/27/23 2147 06/27/23 2256    06/27/23 2200  metroNIDAZOLE (FLAGYL) IVPB 500 mg        500 mg 100 mL/hr over 60 Minutes Intravenous  Once 06/27/23 2147 06/28/23 0237   06/27/23 2200  vancomycin (VANCOREADY) IVPB 1250 mg/250 mL        1,250 mg 166.7 mL/hr over 90 Minutes Intravenous  Once 06/27/23 2149 06/28/23 0237       Subjective: Patient was seen and examined at bedside.  Overnight events noted.   Patient seems very deconditioned, sick looking, denies any chest pain or shortness of breath.  Objective: Vitals:   06/28/23 1800 06/28/23 2146 06/28/23 2202 06/29/23 0807  BP: 110/60 (!) 78/58 (!) 95/53 (!) 103/59  Pulse: 71 65 65 63  Resp: 18 19  18   Temp: 99.8 F (37.7 C) 98.1 F (36.7 C)  98 F (36.7 C)  TempSrc: Oral Oral    SpO2: 99% 100%  96%    Intake/Output Summary (Last 24 hours) at 06/29/2023 1037 Last data filed at 06/29/2023 0700 Gross per 24 hour  Intake 50 ml  Output --  Net 50 ml   There were no vitals filed for this visit.  Examination:  General exam: Appears calm and comfortable, very deconditioned, sick looking, not in any distress. Respiratory system: Clear to auscultation. Respiratory effort normal.  RR 16 Cardiovascular system: S1 & S2 heard, RRR. No JVD, murmurs, rubs, gallops or clicks. No pedal edema. Gastrointestinal system: Abdomen is non distended, soft and non tender. Normal bowel sounds heard. Central nervous system: Alert and oriented x 3. No focal neurological deficits. Extremities: No edema, no cyanosis, no clubbing. Skin: No rashes, lesions or ulcers Psychiatry: Judgement and insight appear normal. Mood & affect appropriate.     Data Reviewed: I have personally reviewed following labs and imaging studies  CBC: Recent Labs  Lab 06/27/23 2104 06/28/23 0632 06/28/23 0634 06/29/23 0545  WBC 8.5 10.3 9.9 6.3  NEUTROABS  --  7.8*  --   --   HGB 9.8* 8.4* 8.4* 6.4*  HCT 28.0* 22.9* 23.3* 18.8*  MCV 94.6 89.8 90.7 94.9  PLT 310 266 256 235   Basic Metabolic  Panel: Recent Labs  Lab 06/27/23 2019 06/27/23 2104 06/28/23 0046 06/28/23 0632 06/28/23 0634 06/28/23 2133 06/29/23 0545  NA 130*  --   --   --  132* 134* 135  K 2.1*  --   --   --  2.6* 3.7 3.4*  CL 93*  --   --   --  99 106 109  CO2 22  --   --   --  23 16* 20*  GLUCOSE 101*  --   --   --  69* 70 72  BUN 27*  --   --   --  25* 24* 22  CREATININE 1.91*  --   --   --  1.79* 1.67* 1.66*  CALCIUM 8.2*  --   --   --  7.7* 7.4* 7.4*  MG  --  1.9 1.5* 1.6*  --   --  2.1   GFR: CrCl cannot be  calculated (Unknown ideal weight.). Liver Function Tests: Recent Labs  Lab 06/27/23 2019  AST 99*  ALT 57*  ALKPHOS 123  BILITOT 1.9*  PROT 7.2  ALBUMIN 3.3*   No results for input(s): "LIPASE", "AMYLASE" in the last 168 hours. No results for input(s): "AMMONIA" in the last 168 hours. Coagulation Profile: Recent Labs  Lab 06/27/23 2019  INR 1.0   Cardiac Enzymes: No results for input(s): "CKTOTAL", "CKMB", "CKMBINDEX", "TROPONINI" in the last 168 hours. BNP (last 3 results) No results for input(s): "PROBNP" in the last 8760 hours. HbA1C: No results for input(s): "HGBA1C" in the last 72 hours. CBG: No results for input(s): "GLUCAP" in the last 168 hours. Lipid Profile: No results for input(s): "CHOL", "HDL", "LDLCALC", "TRIG", "CHOLHDL", "LDLDIRECT" in the last 72 hours. Thyroid Function Tests: Recent Labs    06/28/23 0632  TSH 316.000*   Anemia Panel: Recent Labs    06/28/23 1610  FOLATE 4.6*   Sepsis Labs: Recent Labs  Lab 06/27/23 2104 06/28/23 0046  LATICACIDVEN 2.2* 1.3    Recent Results (from the past 240 hours)  Blood Culture (routine x 2)     Status: None (Preliminary result)   Collection Time: 06/27/23  9:04 PM   Specimen: BLOOD  Result Value Ref Range Status   Specimen Description BLOOD BLOOD LEFT ARM  Final   Special Requests   Final    BOTTLES DRAWN AEROBIC AND ANAEROBIC Blood Culture results may not be optimal due to an inadequate volume of  blood received in culture bottles   Culture   Final    NO GROWTH 2 DAYS Performed at [facility], [address], [place], [place] [address]    Report Status PENDING  Incomplete  Blood Culture (routine x 2)     Status: None (Preliminary result)   Collection Time: 06/27/23  9:04 PM   Specimen: BLOOD  Result Value Ref Range Status   Specimen Description BLOOD BLOOD RIGHT ARM  Final   Special Requests   Final    BOTTLES DRAWN AEROBIC AND ANAEROBIC Blood Culture results may not be optimal due to an inadequate volume of blood received in culture bottles   Culture   Final    NO GROWTH 2 DAYS Performed at [facility], [address] [place], [place] [address]    Report Status PENDING  Incomplete  Gastrointestinal Panel by PCR , Stool     Status: None   Collection Time: 06/28/23  4:00 AM   Specimen: Stool  Result Value Ref Range Status   Campylobacter species NOT DETECTED NOT DETECTED Final   Plesimonas shigelloides NOT DETECTED NOT DETECTED Final   Salmonella species NOT DETECTED NOT DETECTED Final   Yersinia enterocolitica NOT DETECTED NOT DETECTED Final   Vibrio species NOT DETECTED NOT DETECTED Final   Vibrio cholerae NOT DETECTED NOT DETECTED Final   Enteroaggregative E coli (EAEC) NOT DETECTED NOT DETECTED Final   Enteropathogenic E coli (EPEC) NOT DETECTED NOT DETECTED Final   Enterotoxigenic E coli (ETEC) NOT DETECTED NOT DETECTED Final   Shiga like toxin producing E coli (STEC) NOT DETECTED NOT DETECTED Final   Shigella/Enteroinvasive E coli (EIEC) NOT DETECTED NOT DETECTED Final   Cryptosporidium NOT DETECTED NOT DETECTED Final   Cyclospora cayetanensis NOT DETECTED NOT DETECTED Final   Entamoeba histolytica NOT DETECTED NOT DETECTED Final   Giardia lamblia NOT DETECTED NOT DETECTED Final   Adenovirus F40/41 NOT DETECTED NOT DETECTED Final   Astrovirus NOT DETECTED NOT DETECTED Final  Norovirus GI/GII NOT DETECTED NOT DETECTED Final   Rotavirus A NOT  DETECTED NOT DETECTED Final   Sapovirus (I, II, IV, and V) NOT DETECTED NOT DETECTED Final    Comment: Performed at [facility], [address] [place], [place] [address]  C Difficile Quick Screen w PCR reflex     Status: Abnormal   Collection Time: 06/28/23  4:00 AM   Specimen: Stool  Result Value Ref Range Status   C Diff antigen POSITIVE (A) NEGATIVE Final   C Diff toxin NEGATIVE NEGATIVE Final   C Diff interpretation Results are indeterminate. See PCR results.  Final    Comment: Performed at [facility], [address] [place], [place] [address]  C. Diff by PCR, Reflexed     Status: Abnormal   Collection Time: 06/28/23  4:00 AM  Result Value Ref Range Status   Toxigenic C. Difficile by PCR POSITIVE (A) NEGATIVE Final    Comment: Positive for toxigenic C. difficile with little to no toxin production. Only treat if clinical presentation suggests symptomatic illness. Performed at [facility], [address], [place], [place] [address]     Radiology Studies: US Venous Img Upper Uni Left (DVT) Result Date: 06/28/2023 CLINICAL DATA:  Left upper extremity swelling, initial encounter EXAM: LEFT UPPER EXTREMITY VENOUS DOPPLER ULTRASOUND TECHNIQUE: Gray-scale sonography with graded compression, as well as color Doppler and duplex ultrasound were performed to evaluate the upper extremity deep venous system from the level of the subclavian vein and including the jugular, axillary, basilic, radial, ulnar and upper cephalic vein. Spectral Doppler was utilized to evaluate flow at rest and with distal augmentation maneuvers. COMPARISON:  None Available. FINDINGS: Contralateral Subclavian Vein: Respiratory phasicity is normal and symmetric with the symptomatic side. No evidence of thrombus. Normal compressibility. Internal Jugular Vein: No evidence of thrombus. Normal compressibility, respiratory phasicity and response to augmentation. Subclavian Vein: No evidence of  thrombus. Normal compressibility, respiratory phasicity and response to augmentation. Axillary Vein: No evidence of thrombus. Normal compressibility, respiratory phasicity and response to augmentation. Cephalic Vein: No evidence of thrombus. Normal compressibility, respiratory phasicity and response to augmentation. Basilic Vein: No evidence of thrombus. Normal compressibility, respiratory phasicity and response to augmentation. Brachial Veins: No evidence of thrombus. Normal compressibility, respiratory phasicity and response to augmentation. Radial Veins: No evidence of thrombus. Normal compressibility, respiratory phasicity and response to augmentation. Ulnar Veins: No evidence of thrombus. Normal compressibility, respiratory phasicity and response to augmentation. Venous Reflux:  None visualized. Other Findings:  Mild edema is noted. IMPRESSION: No evidence of DVT within the left upper extremity. Electronically Signed   By: Alcide Clever M.D.   On: 06/28/2023 23:04   CT ABDOMEN PELVIS WO CONTRAST Result Date: 06/27/2023 CLINICAL DATA:  Weakness, blood in stool for 2 months, abdominal pain, reported history of gastric cancer EXAM: CT ABDOMEN AND PELVIS WITHOUT CONTRAST TECHNIQUE: Multidetector CT imaging of the abdomen and pelvis was performed following the standard protocol without IV contrast. Unenhanced CT was performed per clinician order. Lack of IV contrast limits sensitivity and specificity, especially for evaluation of abdominal/pelvic solid viscera. RADIATION DOSE REDUCTION: This exam was performed according to the departmental dose-optimization program which includes automated exposure control, adjustment of the mA and/or kV according to patient size and/or use of iterative reconstruction technique. COMPARISON:  05/22/2023 FINDINGS: Lower chest: Stable right middle lobe scarring. No acute pleural or parenchymal lung disease. Decreased attenuation of the cardiac chambers may reflect underlying anemia.  Hepatobiliary: Unremarkable unenhanced appearance  of the liver and gallbladder. Pancreas: Unremarkable unenhanced appearance. Spleen: Unremarkable unenhanced appearance. Adrenals/Urinary Tract: The adrenals are unremarkable. No urinary tract calculi or obstructive uropathy within either kidney. Bladder is unremarkable. Stomach/Bowel: No bowel obstruction or ileus. Scattered diverticulosis of the descending and sigmoid colon. There is long segment circumferential wall thickening extending from the distal descending colon through the rectum, compatible with inflammatory or infectious colitis. Marked perirectal and presacral fat stranding. Stable hiatal hernia. Vascular/Lymphatic: Aortic atherosclerosis. No enlarged abdominal or pelvic lymph nodes. Reproductive: Uterus and bilateral adnexa are unremarkable. Other: Trace pelvic free fluid. No free intraperitoneal gas. No abdominal wall hernia. Musculoskeletal: No acute or destructive bony abnormalities. Reconstructed images demonstrate no additional findings. IMPRESSION: 1. Wall thickening of the distal descending colon through the rectum, compatible with inflammatory or infectious colitis. 2. Trace pelvic free fluid and perirectal fat stranding, compatible with reactive changes. 3.  Aortic Atherosclerosis (ICD10-I70.0). 4. Decreased attenuation of the cardiac chambers, which may reflect underlying anemia. Electronically Signed   By: Sharlet Salina M.D.   On: 06/27/2023 22:39   DG Chest Port 1 View Result Date: 06/27/2023 CLINICAL DATA:  Clinical concern for sepsis.  Ex-smoker. EXAM: PORTABLE CHEST 1 VIEW COMPARISON:  05/22/2023 FINDINGS: Normal sized heart. Partially calcified thoracic aorta. Clear lungs with normal vascularity. Stable mild dextroconvex thoracic scoliosis. IMPRESSION: No acute abnormality.  No evidence of pneumonia. Electronically Signed   By: Beckie Salts M.D.   On: 06/27/2023 21:03   Scheduled Meds:  sodium chloride   Intravenous Once    dicyclomine  10 mg Oral TID AC   feeding supplement  1 Container Oral TID BM   ferrous sulfate  325 mg Oral Daily   isosorbide mononitrate  120 mg Oral BID   levothyroxine  88 mcg Oral Q0600   lidocaine  1 patch Transdermal Q12H   midodrine  5 mg Oral TID WC   mometasone-formoterol  2 puff Inhalation BID   montelukast  10 mg Oral QHS   multivitamin with minerals  1 tablet Oral Daily   pantoprazole (PROTONIX) IV  40 mg Intravenous Q12H   ranolazine  500 mg Oral BID   traZODone  100 mg Oral QHS   vancomycin  125 mg Oral QID   Continuous Infusions:   LOS: 0 days    Time spent: 50 mins    Willeen Niece, MD [facility]   If 7PM-7AM, please contact night-coverage

## 2023-06-29 NOTE — Plan of Care (Signed)

## 2023-06-30 DIAGNOSIS — K529 Noninfective gastroenteritis and colitis, unspecified: Secondary | ICD-10-CM | POA: Diagnosis not present

## 2023-06-30 DIAGNOSIS — N179 Acute kidney failure, unspecified: Secondary | ICD-10-CM | POA: Diagnosis present

## 2023-06-30 DIAGNOSIS — D529 Folate deficiency anemia, unspecified: Secondary | ICD-10-CM | POA: Diagnosis present

## 2023-06-30 DIAGNOSIS — E785 Hyperlipidemia, unspecified: Secondary | ICD-10-CM | POA: Diagnosis present

## 2023-06-30 DIAGNOSIS — J449 Chronic obstructive pulmonary disease, unspecified: Secondary | ICD-10-CM | POA: Diagnosis present

## 2023-06-30 DIAGNOSIS — E878 Other disorders of electrolyte and fluid balance, not elsewhere classified: Secondary | ICD-10-CM | POA: Diagnosis present

## 2023-06-30 DIAGNOSIS — E860 Dehydration: Secondary | ICD-10-CM | POA: Diagnosis present

## 2023-06-30 DIAGNOSIS — F39 Unspecified mood [affective] disorder: Secondary | ICD-10-CM | POA: Diagnosis present

## 2023-06-30 DIAGNOSIS — R54 Age-related physical debility: Secondary | ICD-10-CM | POA: Diagnosis present

## 2023-06-30 DIAGNOSIS — C50311 Malignant neoplasm of lower-inner quadrant of right female breast: Secondary | ICD-10-CM | POA: Diagnosis not present

## 2023-06-30 DIAGNOSIS — F411 Generalized anxiety disorder: Secondary | ICD-10-CM | POA: Diagnosis present

## 2023-06-30 DIAGNOSIS — I25118 Atherosclerotic heart disease of native coronary artery with other forms of angina pectoris: Secondary | ICD-10-CM | POA: Diagnosis present

## 2023-06-30 DIAGNOSIS — D631 Anemia in chronic kidney disease: Secondary | ICD-10-CM | POA: Diagnosis present

## 2023-06-30 DIAGNOSIS — E039 Hypothyroidism, unspecified: Secondary | ICD-10-CM

## 2023-06-30 DIAGNOSIS — A0472 Enterocolitis due to Clostridium difficile, not specified as recurrent: Secondary | ICD-10-CM | POA: Diagnosis present

## 2023-06-30 DIAGNOSIS — Z66 Do not resuscitate: Secondary | ICD-10-CM | POA: Diagnosis present

## 2023-06-30 DIAGNOSIS — I5042 Chronic combined systolic (congestive) and diastolic (congestive) heart failure: Secondary | ICD-10-CM | POA: Diagnosis present

## 2023-06-30 DIAGNOSIS — I959 Hypotension, unspecified: Secondary | ICD-10-CM

## 2023-06-30 DIAGNOSIS — I130 Hypertensive heart and chronic kidney disease with heart failure and stage 1 through stage 4 chronic kidney disease, or unspecified chronic kidney disease: Secondary | ICD-10-CM | POA: Diagnosis present

## 2023-06-30 DIAGNOSIS — E871 Hypo-osmolality and hyponatremia: Secondary | ICD-10-CM | POA: Diagnosis present

## 2023-06-30 DIAGNOSIS — E440 Moderate protein-calorie malnutrition: Secondary | ICD-10-CM | POA: Insufficient documentation

## 2023-06-30 DIAGNOSIS — N1832 Chronic kidney disease, stage 3b: Secondary | ICD-10-CM | POA: Diagnosis present

## 2023-06-30 DIAGNOSIS — K580 Irritable bowel syndrome with diarrhea: Secondary | ICD-10-CM | POA: Diagnosis not present

## 2023-06-30 DIAGNOSIS — E876 Hypokalemia: Secondary | ICD-10-CM | POA: Diagnosis not present

## 2023-06-30 DIAGNOSIS — I9589 Other hypotension: Secondary | ICD-10-CM | POA: Diagnosis present

## 2023-06-30 DIAGNOSIS — E538 Deficiency of other specified B group vitamins: Secondary | ICD-10-CM | POA: Diagnosis present

## 2023-06-30 DIAGNOSIS — R64 Cachexia: Secondary | ICD-10-CM | POA: Diagnosis present

## 2023-06-30 DIAGNOSIS — F32A Depression, unspecified: Secondary | ICD-10-CM | POA: Diagnosis present

## 2023-06-30 DIAGNOSIS — E872 Acidosis, unspecified: Secondary | ICD-10-CM | POA: Diagnosis present

## 2023-06-30 LAB — CBC
HCT: 21.8 % — ABNORMAL LOW (ref 36.0–46.0)
Hemoglobin: 7.7 g/dL — ABNORMAL LOW (ref 12.0–15.0)
MCH: 31.6 pg (ref 26.0–34.0)
MCHC: 35.3 g/dL (ref 30.0–36.0)
MCV: 89.3 fL (ref 80.0–100.0)
Platelets: 202 10*3/uL (ref 150–400)
RBC: 2.44 MIL/uL — ABNORMAL LOW (ref 3.87–5.11)
RDW: 16.3 % — ABNORMAL HIGH (ref 11.5–15.5)
WBC: 5 10*3/uL (ref 4.0–10.5)
nRBC: 0 % (ref 0.0–0.2)

## 2023-06-30 LAB — BASIC METABOLIC PANEL
Anion gap: 5 (ref 5–15)
BUN: 19 mg/dL (ref 8–23)
CO2: 18 mmol/L — ABNORMAL LOW (ref 22–32)
Calcium: 7.4 mg/dL — ABNORMAL LOW (ref 8.9–10.3)
Chloride: 102 mmol/L (ref 98–111)
Creatinine, Ser: 1.46 mg/dL — ABNORMAL HIGH (ref 0.44–1.00)
GFR, Estimated: 38 mL/min — ABNORMAL LOW (ref >60)
Glucose, Bld: 85 mg/dL (ref 70–99)
Potassium: 3 mmol/L — ABNORMAL LOW (ref 3.5–5.1)
Sodium: 130 mmol/L — ABNORMAL LOW (ref 135–145)

## 2023-06-30 LAB — TYPE AND SCREEN
ABO/RH(D): O POS
Antibody Screen: NEGATIVE
Unit division: 0

## 2023-06-30 LAB — BPAM RBC
Blood Product Expiration Date: 202501132359
ISSUE DATE / TIME: 202412131225
Unit Type and Rh: 5100

## 2023-06-30 LAB — MAGNESIUM: Magnesium: 1.9 mg/dL (ref 1.7–2.4)

## 2023-06-30 MED ORDER — POTASSIUM CHLORIDE CRYS ER 20 MEQ PO TBCR
40.0000 meq | EXTENDED_RELEASE_TABLET | Freq: Three times a day (TID) | ORAL | Status: AC
  Administered 2023-06-30 (×3): 40 meq via ORAL
  Filled 2023-06-30 (×3): qty 2

## 2023-06-30 MED ORDER — CLOPIDOGREL BISULFATE 75 MG PO TABS
75.0000 mg | ORAL_TABLET | Freq: Every day | ORAL | Status: DC
Start: 2023-06-30 — End: 2023-07-09
  Administered 2023-06-30 – 2023-07-09 (×10): 75 mg via ORAL
  Filled 2023-06-30 (×10): qty 1

## 2023-06-30 MED ORDER — ASPIRIN 81 MG PO TBEC
81.0000 mg | DELAYED_RELEASE_TABLET | Freq: Every day | ORAL | Status: DC
Start: 2023-06-30 — End: 2023-07-09
  Administered 2023-06-30 – 2023-07-09 (×10): 81 mg via ORAL
  Filled 2023-06-30 (×10): qty 1

## 2023-06-30 NOTE — TOC CM/SW Note (Signed)
CSW is covering room 131 today. Attempted to speak to patient regarding SNF rec from 06/28/23. Patient defers to her sister Dewayne Hatch, states "she takes care of me." Lianne Cure, left a VM requesting a return call.  Alfonso Ramus, LCSW Transitions of Care Department [phone]

## 2023-06-30 NOTE — Plan of Care (Signed)

## 2023-06-30 NOTE — Progress Notes (Signed)
[facility]  PROGRESS NOTE  TZIREL MADIA ZOX:[id number] DOB: Sep 14, 1950 DOA: 06/27/2023 PCP: Margaretann Loveless, MD   Brief HPI:   72 yrs old female with PMH  significant for CHF, anxiety, depression, GERD, CKD, hypertension , IBS, hypothyroidism , who presented to the emergency room with acute onset of diarrhea for the last 3 weeks with associated nausea and vomiting as well as left lower quadrant abdominal pain and occasional back pain.  She denied any dysuria, oliguria or hematuria or flank pain.  No chest pain or palpitations.  No cough or wheezing.  No melena or bright red bleeding per rectum.  Admitted for further evaluation.     Assessment/Plan:   C. Diff + Colitis Appears to be her first episode , She denies any recent antibiotics.  Colitis seen on CT A/P without complications Continue oral vancomycin Continue IV fluids.   Unintentional weight loss She reports several months of poor appetite, intermittent vomiting, and 50 pounds weight loss, with anemia as well.  She has endoscopy scheduled for February. Dr. Rockie Neighbours has spoken w/ dr. Mia Creek , States C. Diff  wouldn't preclude EGD but electrolyte abnormalities would, so advises to stabilize and replete, Continue c diff treatment, can consider egd in the next few days Speech and swallow evaluation obtained, started on dysphagia 3 diet RD consulted   Normochromic Normocytic anemia Baseline Hgb is around 10, here 9.> 8.4, No Obvious visible bleeding.  Recent eval for iron deficiency Neg, Normal b12 F/u folic acid and smear GI recommended endoscopy as an outpatient after adequate treatment for her C. difficile. Continue IV pantoprazole. -Hemoglobin dropped to 6.4, status post 1 unit PRBC -Hemoglobin is 7.7 today   Left arm swelling Distal left radius fracture Fracture followed by ortho, is being managed non-operatively.  Soft tissue swelling in this arm as well, quite possibly 2/2 fracture Left upper extremity venous  duplex negative for DVT.   Hypokalemia/ Hypomagnesemia Replaced.  Continue to monitor   CKD stage IIIb Serum creatinine at baseline. Continue IV fluids. Hold Lasix for now.   Chronic hypotension: Continue midodrine.   Generalized anxiety disorder: Continue alprazolam.   Hypothyroidism: Continue levothyroxine.   CAD: History of stent years ago, 2021 LHC showed patent stent, advised treat medically.  No chest pain. Continue Ranexa, Imdur.   Resume aspirin/statin/Plavix   Debility/deconditioning: PT / OT consult     Medications     dicyclomine  10 mg Oral TID AC   feeding supplement  1 Container Oral TID BM   ferrous sulfate  325 mg Oral Daily   isosorbide mononitrate  120 mg Oral BID   levothyroxine  88 mcg Oral Q0600   lidocaine  1 patch Transdermal Q12H   midodrine  5 mg Oral TID WC   mometasone-formoterol  2 puff Inhalation BID   montelukast  10 mg Oral QHS   multivitamin with minerals  1 tablet Oral Daily   pantoprazole (PROTONIX) IV  40 mg Intravenous Q12H   ranolazine  500 mg Oral BID   traZODone  100 mg Oral QHS   vancomycin  125 mg Oral QID     Data Reviewed:   CBG:  No results for input(s): "GLUCAP" in the last 168 hours.  SpO2: 97 %    Vitals:   06/29/23 1543 06/29/23 2136 06/30/23 0500 06/30/23 0829  BP: (!) 93/59 (!) 95/56 (!) 95/59 101/65  Pulse: 60 (!) 57 (!) 56 62  Resp: 14  18 16   Temp: 97.6 F (36.4  C)  97.6 F (36.4 C) (!) 97.3 F (36.3 C)  TempSrc: Oral  Oral   SpO2: 99%  99% 97%  Weight:   60 kg   Height:  5\' 4"  (1.626 m)        Data Reviewed:  Basic Metabolic Panel: Recent Labs  Lab 06/27/23 2019 06/27/23 2104 06/28/23 0046 06/28/23 1610 06/28/23 0634 06/28/23 2133 06/29/23 0545 06/30/23 0253  NA 130*  --   --   --  132* 134* 135 130*  K 2.1*  --   --   --  2.6* 3.7 3.4* 3.0*  CL 93*  --   --   --  99 106 109 102  CO2 22  --   --   --  23 16* 20* 18*  GLUCOSE 101*  --   --   --  69* 70 72 85  BUN 27*  --    --   --  25* 24* 22 19  CREATININE 1.91*  --   --   --  1.79* 1.67* 1.66* 1.46*  CALCIUM 8.2*  --   --   --  7.7* 7.4* 7.4* 7.4*  MG  --  1.9 1.5* 1.6*  --   --  2.1 1.9    CBC: Recent Labs  Lab 06/27/23 2104 06/28/23 0632 06/28/23 0634 06/29/23 0545 06/30/23 0253  WBC 8.5 10.3 9.9 6.3 5.0  NEUTROABS  --  7.8*  --   --   --   HGB 9.8* 8.4* 8.4* 6.4* 7.7*  HCT 28.0* 22.9* 23.3* 18.8* 21.8*  MCV 94.6 89.8 90.7 94.9 89.3  PLT 310 266 256 235 202    LFT Recent Labs  Lab 06/27/23 2019  AST 99*  ALT 57*  ALKPHOS 123  BILITOT 1.9*  PROT 7.2  ALBUMIN 3.3*     Antibiotics: Anti-infectives (From admission, onward)    Start     Dose/Rate Route Frequency Ordered Stop   06/28/23 2200  cefTRIAXone (ROCEPHIN) 2 g in sodium chloride 0.9 % 100 mL IVPB  Status:  Discontinued        2 g 200 mL/hr over 30 Minutes Intravenous Every 24 hours 06/28/23 0538 06/28/23 0924   06/28/23 1000  metroNIDAZOLE (FLAGYL) IVPB 500 mg  Status:  Discontinued        500 mg 100 mL/hr over 60 Minutes Intravenous Every 8 hours 06/28/23 0538 06/28/23 0944   06/28/23 1000  vancomycin (VANCOCIN) capsule 125 mg        125 mg Oral 4 times daily 06/28/23 0943 07/08/23 0959   06/27/23 2200  ceFEPIme (MAXIPIME) 2 g in sodium chloride 0.9 % 100 mL IVPB        2 g 200 mL/hr over 30 Minutes Intravenous  Once 06/27/23 2147 06/27/23 2256   06/27/23 2200  metroNIDAZOLE (FLAGYL) IVPB 500 mg        500 mg 100 mL/hr over 60 Minutes Intravenous  Once 06/27/23 2147 06/28/23 0237   06/27/23 2200  vancomycin (VANCOREADY) IVPB 1250 mg/250 mL        1,250 mg 166.7 mL/hr over 90 Minutes Intravenous  Once 06/27/23 2149 06/28/23 0237        DVT prophylaxis: SCDs  Code Status: DNR  Family Communication: No family at bedside   CONSULTS    Subjective   Continues to have loose stools   Objective    Physical Examination:   General-appears in no acute distress Heart-S1-S2, regular, no murmur  auscultated Lungs-clear to auscultation bilaterally,  no wheezing or crackles auscultated Abdomen-soft, nontender, no organomegaly Extremities-no edema in the lower extremities Neuro-alert, oriented x3, no focal deficit noted   Status is: Inpatient:             Meredeth Ide   [facility] If 7PM-7AM, please contact night-coverage at [email or web address], Office  [phone]   06/30/2023, 10:43 AM  LOS: 0 days

## 2023-07-01 DIAGNOSIS — C50311 Malignant neoplasm of lower-inner quadrant of right female breast: Secondary | ICD-10-CM

## 2023-07-01 DIAGNOSIS — I5042 Chronic combined systolic (congestive) and diastolic (congestive) heart failure: Secondary | ICD-10-CM

## 2023-07-01 DIAGNOSIS — Z170 Estrogen receptor positive status [ER+]: Secondary | ICD-10-CM

## 2023-07-01 DIAGNOSIS — E039 Hypothyroidism, unspecified: Secondary | ICD-10-CM | POA: Diagnosis not present

## 2023-07-01 DIAGNOSIS — N179 Acute kidney failure, unspecified: Secondary | ICD-10-CM | POA: Diagnosis not present

## 2023-07-01 DIAGNOSIS — K529 Noninfective gastroenteritis and colitis, unspecified: Secondary | ICD-10-CM | POA: Diagnosis not present

## 2023-07-01 DIAGNOSIS — E440 Moderate protein-calorie malnutrition: Secondary | ICD-10-CM

## 2023-07-01 DIAGNOSIS — E876 Hypokalemia: Secondary | ICD-10-CM | POA: Diagnosis not present

## 2023-07-01 DIAGNOSIS — J449 Chronic obstructive pulmonary disease, unspecified: Secondary | ICD-10-CM

## 2023-07-01 DIAGNOSIS — E538 Deficiency of other specified B group vitamins: Secondary | ICD-10-CM | POA: Insufficient documentation

## 2023-07-01 LAB — MAGNESIUM: Magnesium: 1.8 mg/dL (ref 1.7–2.4)

## 2023-07-01 LAB — BASIC METABOLIC PANEL
Anion gap: 8 (ref 5–15)
BUN: 14 mg/dL (ref 8–23)
CO2: 17 mmol/L — ABNORMAL LOW (ref 22–32)
Calcium: 8.4 mg/dL — ABNORMAL LOW (ref 8.9–10.3)
Chloride: 103 mmol/L (ref 98–111)
Creatinine, Ser: 1.41 mg/dL — ABNORMAL HIGH (ref 0.44–1.00)
GFR, Estimated: 39 mL/min — ABNORMAL LOW (ref >60)
Glucose, Bld: 73 mg/dL (ref 70–99)
Potassium: 4.8 mmol/L (ref 3.5–5.1)
Sodium: 128 mmol/L — ABNORMAL LOW (ref 135–145)

## 2023-07-01 LAB — CBC
HCT: 27.1 % — ABNORMAL LOW (ref 36.0–46.0)
Hemoglobin: 9.5 g/dL — ABNORMAL LOW (ref 12.0–15.0)
MCH: 31.3 pg (ref 26.0–34.0)
MCHC: 35.1 g/dL (ref 30.0–36.0)
MCV: 89.1 fL (ref 80.0–100.0)
Platelets: 251 10*3/uL (ref 150–400)
RBC: 3.04 MIL/uL — ABNORMAL LOW (ref 3.87–5.11)
RDW: 16.5 % — ABNORMAL HIGH (ref 11.5–15.5)
WBC: 5.8 10*3/uL (ref 4.0–10.5)
nRBC: 0 % (ref 0.0–0.2)

## 2023-07-01 LAB — TSH: TSH: 328 u[IU]/mL — ABNORMAL HIGH (ref 0.350–4.500)

## 2023-07-01 LAB — T4, FREE: Free T4: 0.37 ng/dL — ABNORMAL LOW (ref 0.61–1.12)

## 2023-07-01 MED ORDER — FOLIC ACID 1 MG PO TABS
1.0000 mg | ORAL_TABLET | Freq: Every day | ORAL | Status: DC
  Administered 2023-07-01 – 2023-07-09 (×9): 1 mg via ORAL
  Filled 2023-07-01 (×9): qty 1

## 2023-07-01 MED ORDER — LEVOTHYROXINE SODIUM 100 MCG/5ML IV SOLN
66.0000 ug | Freq: Every day | INTRAVENOUS | Status: DC
  Administered 2023-07-01 – 2023-07-04 (×4): 66 ug via INTRAVENOUS
  Filled 2023-07-01 (×4): qty 5

## 2023-07-01 NOTE — Assessment & Plan Note (Signed)
No swelling, on room air, no reports of dyspnea - Continue aspirin, Plavix, Imdur - Hold home Lasix

## 2023-07-01 NOTE — Assessment & Plan Note (Signed)
As evidenced by moderate loss of subcutaneous muscle mass and fat diffusely - Continue nutrition supplements

## 2023-07-01 NOTE — Hospital Course (Addendum)
Brief Narrative:  72 year old female with past medical history significant for chronic combined systolic and diastolic CHF, hypertension, COPD, hypothyroidism, CAD, hyperlipidemia, mood disorder who presented with severe diarrhea and cramps for over a week. In the emergency department, patient was found to be hypotensive, creatinine up to 1.9, C. difficile was positive. Patient was admitted for C. difficile colitis, AKI. She was started on oral vancomycin  Was also noted to have severe hypothyroidism with elevated TSH and low free T4.  Initially received IV Synthroid and then transition to p.o. TSH level is slowly improving.  PT/OT recommended SNF.  TOC working on placement.  Otherwise medically cleared  Assessment & Plan:  Principal Problem:   Acute C. difficile colitis Active Problems:   Severe hypothyroidism   Breast cancer of lower-inner quadrant of right female breast (HCC)   Chronic combined systolic and diastolic CHF (congestive heart failure) (HCC)   Chronic hypotension   Hyponatremia   Hypokalemia   Acute kidney injury superimposed on chronic kidney disease (HCC)   Coronary artery disease with stable angina   Chronic obstructive lung disease (HCC)   Malnutrition of moderate degree   Folate deficiency     Acute C. difficile colitis Improving on p.o. vancomycin.  EOT 12/21   Severe hypothyroidism -TSH 328 > 270, free T4 0.37 -Started on IV levothyroxine --> PO  -Can repeat TSH weekly to make sure its trending down   Folate deficiency -Supplement   Malnutrition of moderate degree -Continue nutrition supplements   COPD -Without acute exacerbation -Inhalers, Singulair, PPI   CAD -Aspirin, Plavix, Ranexa, Imdur   AKI on CKD stage IIIb -Improved, baseline creatinine around 1.4   Chronic hypotension -Midodrine   Chronic combined systolic and diastolic CHF -Holding home Lasix in setting of AKI and C. difficile colitis   Mood disorder/anxiety -Xanax    Hypokalemia -Replace     DVT prophylaxis: Place and maintain sequential compression device Start: 06/28/23 0946  Code Status: DNR Family Communication: None at bedside Disposition Plan: SNF recommended Status is: Inpatient Remains inpatient appropriate because: SNF placement pending.  Medically cleared   Subjective: No complaints awaiting SNF placement  Examination:  General exam: Appears calm and comfortable, cachectic frail Respiratory system: Clear to auscultation. Respiratory effort normal. Cardiovascular system: S1 & S2 heard, RRR. No JVD, murmurs, rubs, gallops or clicks. No pedal edema. Gastrointestinal system: Abdomen is nondistended, soft and nontender. No organomegaly or masses felt. Normal bowel sounds heard. Central nervous system: Alert and oriented. No focal neurological deficits. Extremities: Symmetric 5 x 5 power. Skin: No rashes, lesions or ulcers Psychiatry: Judgement and insight appear normal. Mood & affect appropriate.

## 2023-07-01 NOTE — Assessment & Plan Note (Signed)
-   Start supplemental folate

## 2023-07-01 NOTE — TOC CM/SW Note (Signed)
CSW attempted another call to patient's sister Dewayne Hatch to discuss SNF rec. Left another VM requesting a return call.  Alfonso Ramus, LCSW Transitions of Care Department [phone]

## 2023-07-01 NOTE — Plan of Care (Signed)

## 2023-07-01 NOTE — Progress Notes (Signed)
  Progress Note   Patient: Abigail Ortega YQM:[id number] DOB: Feb 13, 1951 DOA: 06/27/2023     1 DOS: the patient was seen and examined on 07/01/2023 at 10:05 AM      Brief hospital course: 72 y.o. F with sdCHF E 30%, HTN, COPD, hypothyroidism, CAD, HLD, mood disorder who presented with severe diarrhea and cramps for over a week.    BP 84/54 in the ER, Cr 1.9, Cdiff positive.  Admitted for Cdiff colitis and AKI.     Assessment and Plan: * Acute C. difficile colitis Patient admitted with cramps, watery diarrhea.  C. difficile positive.  Stool stool frequent, but much urine consistency, improving. - Continue oral vancomycin - Hold Lasix until stools slowed down   Severe hypothyroidism TSH on admission 316.  She has no symptoms of decreased mentation, hypothermia, congestive heart failure, doubt myxedema.  However repeat TSH today up to 328, fT4 0.3.  Wonder about poor absorption with cdiff? Not myxedema, no need for cortisol level. - Stop PO LT4 - Start IV levothyroxine  Folate deficiency - Start supplemental folate  Malnutrition of moderate degree As evidenced by moderate loss of subcutaneous muscle mass and fat diffusely - Continue nutrition supplements  Chronic obstructive lung disease (HCC) Asymptomatic, no wheezing - Continue Singulair, PPI - Continue ICS/LABA  Coronary artery disease with stable angina - Continue aspirin, Plavix, Ranexa, Imdur -Hold Lipitor  Acute kidney injury superimposed on chronic kidney disease (HCC) Creatinine 1.8 on admission, improved to baseline 1.4  CKD stage IIIa  Hypokalemia Supplemented and resolved  Hyponatremia Mild. - Hold IV fluids, Lasix  Chronic hypotension Blood pressure normal - Continue midodrine  Chronic combined systolic and diastolic CHF (congestive heart failure) (HCC) No swelling, on room air, no reports of dyspnea - Continue aspirin, Plavix, Imdur - Hold home Lasix  Breast cancer of lower-inner  quadrant of right female breast (HCC)            Subjective: Patient's stools are still mushy, several overnight.  No fever.  Cramps are improving.  No confusion.  No somnolence.  She has generalized weakness.  No new nursing concerns     Physical Exam: BP 93/72 (BP Location: Left Leg)   Pulse 71   Temp 98.6 F (37 C)   Resp 16   Ht 5\' 4"  (1.626 m)   Wt 60 kg   SpO2 100%   BMI 22.71 kg/m   Elderly adult female, reduced muscle mass and fat, sitting up in bed, trying to eat breakfast, severely weak RRR, soft systolic murmur, mild diffuse nonpitting peripheral edema Respiratory effort weak, shallow, lung sounds diminished, no rales or wheezes appreciated Abdomen soft without grimace to palpation, no tenderness or guarding Attentive to questions, oriented to self, but overall weak, asthenic, she appears to have chronic left-sided weakness    Data Reviewed: Patient metabolic panel shows hyponatremia, sodium 128 Potassium up to 4.8, creatinine down to 1.4 TSH over 300 Folate low Free T4 low Hemoglobin up to 9.5    Family Communication: Daughter by phone    Disposition: Status is: Inpatient Patient was admitted for Cdiff colitis, also found to have severe hypothyroidism  She is still having frequent stools and is extremely weak from hypothyroidism, but likely to SNF early this week when stools slow down        Author: Alberteen Sam, MD 07/01/2023 2:01 PM  For on call review [email or web address].

## 2023-07-01 NOTE — Assessment & Plan Note (Addendum)
Blood pressure normal - Continue midodrine

## 2023-07-02 DIAGNOSIS — K529 Noninfective gastroenteritis and colitis, unspecified: Secondary | ICD-10-CM | POA: Diagnosis not present

## 2023-07-02 LAB — CBC
HCT: 25.5 % — ABNORMAL LOW (ref 36.0–46.0)
Hemoglobin: 8.7 g/dL — ABNORMAL LOW (ref 12.0–15.0)
MCH: 30.5 pg (ref 26.0–34.0)
MCHC: 34.1 g/dL (ref 30.0–36.0)
MCV: 89.5 fL (ref 80.0–100.0)
Platelets: 239 10*3/uL (ref 150–400)
RBC: 2.85 MIL/uL — ABNORMAL LOW (ref 3.87–5.11)
RDW: 16.1 % — ABNORMAL HIGH (ref 11.5–15.5)
WBC: 5 10*3/uL (ref 4.0–10.5)
nRBC: 0 % (ref 0.0–0.2)

## 2023-07-02 LAB — CULTURE, BLOOD (ROUTINE X 2)
Culture: NO GROWTH
Culture: NO GROWTH

## 2023-07-02 LAB — COMPREHENSIVE METABOLIC PANEL
ALT: 29 U/L (ref 0–44)
AST: 53 U/L — ABNORMAL HIGH (ref 15–41)
Albumin: 2.4 g/dL — ABNORMAL LOW (ref 3.5–5.0)
Alkaline Phosphatase: 87 U/L (ref 38–126)
Anion gap: 6 (ref 5–15)
BUN: 14 mg/dL (ref 8–23)
CO2: 19 mmol/L — ABNORMAL LOW (ref 22–32)
Calcium: 8.4 mg/dL — ABNORMAL LOW (ref 8.9–10.3)
Chloride: 103 mmol/L (ref 98–111)
Creatinine, Ser: 1.47 mg/dL — ABNORMAL HIGH (ref 0.44–1.00)
GFR, Estimated: 37 mL/min — ABNORMAL LOW (ref >60)
Glucose, Bld: 83 mg/dL (ref 70–99)
Potassium: 4.2 mmol/L (ref 3.5–5.1)
Sodium: 128 mmol/L — ABNORMAL LOW (ref 135–145)
Total Bilirubin: 0.8 mg/dL (ref <1.2)
Total Protein: 5.4 g/dL — ABNORMAL LOW (ref 6.5–8.1)

## 2023-07-02 LAB — MAGNESIUM: Magnesium: 1.6 mg/dL — ABNORMAL LOW (ref 1.7–2.4)

## 2023-07-02 MED ORDER — PANTOPRAZOLE SODIUM 40 MG PO TBEC
40.0000 mg | DELAYED_RELEASE_TABLET | Freq: Two times a day (BID) | ORAL | Status: DC
  Administered 2023-07-02 – 2023-07-09 (×14): 40 mg via ORAL
  Filled 2023-07-02 (×15): qty 1

## 2023-07-02 MED ORDER — ORAL CARE MOUTH RINSE
15.0000 mL | Freq: Three times a day (TID) | OROMUCOSAL | Status: DC
  Administered 2023-07-02 – 2023-07-09 (×12): 15 mL via OROMUCOSAL

## 2023-07-02 MED ORDER — MAGNESIUM SULFATE 2 GM/50ML IV SOLN
2.0000 g | Freq: Once | INTRAVENOUS | Status: AC
  Administered 2023-07-02: 2 g via INTRAVENOUS
  Filled 2023-07-02: qty 50

## 2023-07-02 NOTE — TOC Progression Note (Signed)
Transition of Care [facility]) - Progression Note    Patient Details  Name: Abigail Ortega MRN: [id number] Date of Birth: 13-Mar-1951  [facility]) CM/SW Contact  Garret Reddish, RN Phone Number: 07/02/2023, 3:36 PM  Clinical Narrative:    Chart reviewed.  I have spoken to patient's son Marcial Pacas.   I have spoken to [facility] about SNF recommendations.  Marcial Pacas informs me that he is in agreement to SNF.  Marcial Pacas would like a bed search to be completed in the [place] area.  His preference is [facility].  Marcial Pacas informs me that prior to admission Mrs. Koby lived at home with his aunt.  He informed me that his sister would help with assist with Mrs. Isenberg bathing/dressing daily.  His aunt would also assist with ADL's and giving patient her medications.  Marcial Pacas reports that patient had gotten to the point that she was so weak and was not getting up out of the bed prior to admission.  TOC will continue to follow for discharge planning.          Expected Discharge Plan and Services                                               Social Determinants of Health (SDOH) Interventions SDOH Screenings   Food Insecurity: No Food Insecurity (06/28/2023)  Housing: Unknown (06/28/2023)  Transportation Needs: No Transportation Needs (06/28/2023)  Utilities: Not At Risk (06/28/2023)  Tobacco Use: Medium Risk (06/27/2023)    Readmission Risk Interventions     No data to display

## 2023-07-02 NOTE — Progress Notes (Addendum)
PROGRESS NOTE    Abigail Ortega  PIR:[id number] DOB: 1951-03-16 DOA: 06/27/2023 PCP: Margaretann Loveless, MD     Brief Narrative:  Abigail Ortega is a 72 year old female with past medical history significant for chronic combined systolic and diastolic CHF, hypertension, COPD, hypothyroidism, CAD, hyperlipidemia, mood disorder who presented with severe diarrhea and cramps for over a week.  In the emergency department, patient was found to be hypotensive, creatinine up to 1.9, C. difficile was positive.  Patient was admitted for C. difficile colitis, AKI.  She was started on oral vancomycin.  New events last 24 hours / Subjective: Patient sitting in bed, eating breakfast.  Continues to have some lower abdominal cramping associated with bowel movement.  Still having loose bowel movements that is mixed with urine.  Assessment & Plan:   Principal Problem:   Acute C. difficile colitis Active Problems:   Severe hypothyroidism   Breast cancer of lower-inner quadrant of right female breast (HCC)   Chronic combined systolic and diastolic CHF (congestive heart failure) (HCC)   Chronic hypotension   Hyponatremia   Hypokalemia   Acute kidney injury superimposed on chronic kidney disease (HCC)   Coronary artery disease with stable angina   Chronic obstructive lung disease (HCC)   Malnutrition of moderate degree   Folate deficiency   Acute C. difficile colitis -P.o. vancomycin  Severe hypothyroidism -TSH 328, free T40.37 -Started on IV levothyroxine  Folate deficiency -Supplement  Malnutrition of moderate degree -Continue nutrition supplements  COPD -Without acute exacerbation -Inhalers, Singulair, PPI  CAD -Aspirin, Plavix, Ranexa, Imdur  AKI on CKD stage IIIb -Improved, baseline creatinine around 1.4  Chronic hypotension -Midodrine  Chronic combined systolic and diastolic CHF -Holding home Lasix in setting of AKI and C. difficile  colitis  Hypomagnesemia -Replace  Mood disorder/anxiety -Xanax   DVT prophylaxis:  Place and maintain sequential compression device Start: 06/28/23 0946  Code Status: DNR Family Communication: None at bedside Disposition Plan: SNF recommended Status is: Inpatient Remains inpatient appropriate because: SNF placement pending, awaiting improvement in C. difficile    Antimicrobials:  Anti-infectives (From admission, onward)    Start     Dose/Rate Route Frequency Ordered Stop   06/28/23 2200  cefTRIAXone (ROCEPHIN) 2 g in sodium chloride 0.9 % 100 mL IVPB  Status:  Discontinued        2 g 200 mL/hr over 30 Minutes Intravenous Every 24 hours 06/28/23 0538 06/28/23 0924   06/28/23 1000  metroNIDAZOLE (FLAGYL) IVPB 500 mg  Status:  Discontinued        500 mg 100 mL/hr over 60 Minutes Intravenous Every 8 hours 06/28/23 0538 06/28/23 0944   06/28/23 1000  vancomycin (VANCOCIN) capsule 125 mg        125 mg Oral 4 times daily 06/28/23 0943 07/08/23 0959   06/27/23 2200  ceFEPIme (MAXIPIME) 2 g in sodium chloride 0.9 % 100 mL IVPB        2 g 200 mL/hr over 30 Minutes Intravenous  Once 06/27/23 2147 06/27/23 2256   06/27/23 2200  metroNIDAZOLE (FLAGYL) IVPB 500 mg        500 mg 100 mL/hr over 60 Minutes Intravenous  Once 06/27/23 2147 06/28/23 0237   06/27/23 2200  vancomycin (VANCOREADY) IVPB 1250 mg/250 mL        1,250 mg 166.7 mL/hr over 90 Minutes Intravenous  Once 06/27/23 2149 06/28/23 0237        Objective: Vitals:   07/01/23 0745 07/01/23 1652 07/01/23 2242 07/02/23  0824  BP: 93/72 100/66 101/68 124/61  Pulse: 71 68 72 73  Resp: 16 15  16   Temp: 98.6 F (37 C) 98 F (36.7 C) 98.6 F (37 C) (!) 97.5 F (36.4 C)  TempSrc:  Oral    SpO2: 100% 100% 99% 99%  Weight:      Height:       No intake or output data in the 24 hours ending 07/02/23 1123 Filed Weights   06/30/23 0500  Weight: 60 kg    Examination:  General exam: Appears calm and comfortable   Respiratory system: Clear to auscultation. Respiratory effort normal. No respiratory distress. No conversational dyspnea.  Cardiovascular system: S1 & S2 heard, RRR.  Gastrointestinal system: Abdomen is nondistended, soft  Central nervous system: Alert and oriented. No focal neurological deficits. Speech clear.  Extremities: Symmetric in appearance  Skin: No rashes, lesions or ulcers on exposed skin  Psychiatry: Mood & affect appropriate.   Data Reviewed: I have personally reviewed following labs and imaging studies  CBC: Recent Labs  Lab 06/28/23 0632 06/28/23 0634 06/29/23 0545 06/30/23 0253 07/01/23 0630 07/02/23 0242  WBC 10.3 9.9 6.3 5.0 5.8 5.0  NEUTROABS 7.8*  --   --   --   --   --   HGB 8.4* 8.4* 6.4* 7.7* 9.5* 8.7*  HCT 22.9* 23.3* 18.8* 21.8* 27.1* 25.5*  MCV 89.8 90.7 94.9 89.3 89.1 89.5  PLT 266 256 235 202 251 239   Basic Metabolic Panel: Recent Labs  Lab 06/28/23 0632 06/28/23 0634 06/28/23 2133 06/29/23 0545 06/30/23 0253 07/01/23 0630 07/02/23 0242  NA  --    < > 134* 135 130* 128* 128*  K  --    < > 3.7 3.4* 3.0* 4.8 4.2  CL  --    < > 106 109 102 103 103  CO2  --    < > 16* 20* 18* 17* 19*  GLUCOSE  --    < > 70 72 85 73 83  BUN  --    < > 24* 22 19 14 14   CREATININE  --    < > 1.67* 1.66* 1.46* 1.41* 1.47*  CALCIUM  --    < > 7.4* 7.4* 7.4* 8.4* 8.4*  MG 1.6*  --   --  2.1 1.9 1.8 1.6*   < > = values in this interval not displayed.   GFR: Estimated Creatinine Clearance: 29.4 mL/min (A) (by C-G formula based on SCr of 1.47 mg/dL (H)). Liver Function Tests: Recent Labs  Lab 06/27/23 2019 07/02/23 0242  AST 99* 53*  ALT 57* 29  ALKPHOS 123 87  BILITOT 1.9* 0.8  PROT 7.2 5.4*  ALBUMIN 3.3* 2.4*   No results for input(s): "LIPASE", "AMYLASE" in the last 168 hours. No results for input(s): "AMMONIA" in the last 168 hours. Coagulation Profile: Recent Labs  Lab 06/27/23 2019  INR 1.0   Cardiac Enzymes: No results for input(s):  "CKTOTAL", "CKMB", "CKMBINDEX", "TROPONINI" in the last 168 hours. BNP (last 3 results) No results for input(s): "PROBNP" in the last 8760 hours. HbA1C: No results for input(s): "HGBA1C" in the last 72 hours. CBG: No results for input(s): "GLUCAP" in the last 168 hours. Lipid Profile: No results for input(s): "CHOL", "HDL", "LDLCALC", "TRIG", "CHOLHDL", "LDLDIRECT" in the last 72 hours. Thyroid Function Tests: Recent Labs    07/01/23 0630  TSH 328.000*  FREET4 0.37*   Anemia Panel: No results for input(s): "VITAMINB12", "FOLATE", "FERRITIN", "TIBC", "IRON", "RETICCTPCT" in  the last 72 hours. Sepsis Labs: Recent Labs  Lab 06/27/23 2104 06/28/23 0046  LATICACIDVEN 2.2* 1.3    Recent Results (from the past 240 hours)  Blood Culture (routine x 2)     Status: None   Collection Time: 06/27/23  9:04 PM   Specimen: BLOOD  Result Value Ref Range Status   Specimen Description BLOOD BLOOD LEFT ARM  Final   Special Requests   Final    BOTTLES DRAWN AEROBIC AND ANAEROBIC Blood Culture results may not be optimal due to an inadequate volume of blood received in culture bottles   Culture   Final    NO GROWTH 5 DAYS Performed at [facility], [address] [place], [place] [address]    Report Status 07/02/2023 FINAL  Final  Blood Culture (routine x 2)     Status: None   Collection Time: 06/27/23  9:04 PM   Specimen: BLOOD  Result Value Ref Range Status   Specimen Description BLOOD BLOOD RIGHT ARM  Final   Special Requests   Final    BOTTLES DRAWN AEROBIC AND ANAEROBIC Blood Culture results may not be optimal due to an inadequate volume of blood received in culture bottles   Culture   Final    NO GROWTH 5 DAYS Performed at [facility], [address] [place], [place] [address]    Report Status 07/02/2023 FINAL  Final  Gastrointestinal Panel by PCR , Stool     Status: None   Collection Time: 06/28/23  4:00 AM   Specimen: Stool  Result Value Ref Range Status    Campylobacter species NOT DETECTED NOT DETECTED Final   Plesimonas shigelloides NOT DETECTED NOT DETECTED Final   Salmonella species NOT DETECTED NOT DETECTED Final   Yersinia enterocolitica NOT DETECTED NOT DETECTED Final   Vibrio species NOT DETECTED NOT DETECTED Final   Vibrio cholerae NOT DETECTED NOT DETECTED Final   Enteroaggregative E coli (EAEC) NOT DETECTED NOT DETECTED Final   Enteropathogenic E coli (EPEC) NOT DETECTED NOT DETECTED Final   Enterotoxigenic E coli (ETEC) NOT DETECTED NOT DETECTED Final   Shiga like toxin producing E coli (STEC) NOT DETECTED NOT DETECTED Final   Shigella/Enteroinvasive E coli (EIEC) NOT DETECTED NOT DETECTED Final   Cryptosporidium NOT DETECTED NOT DETECTED Final   Cyclospora cayetanensis NOT DETECTED NOT DETECTED Final   Entamoeba histolytica NOT DETECTED NOT DETECTED Final   Giardia lamblia NOT DETECTED NOT DETECTED Final   Adenovirus F40/41 NOT DETECTED NOT DETECTED Final   Astrovirus NOT DETECTED NOT DETECTED Final   Norovirus GI/GII NOT DETECTED NOT DETECTED Final   Rotavirus A NOT DETECTED NOT DETECTED Final   Sapovirus (I, II, IV, and V) NOT DETECTED NOT DETECTED Final    Comment: Performed at [facility], [address] [place], [place] [address]  C Difficile Quick Screen w PCR reflex     Status: Abnormal   Collection Time: 06/28/23  4:00 AM   Specimen: Stool  Result Value Ref Range Status   C Diff antigen POSITIVE (A) NEGATIVE Final   C Diff toxin NEGATIVE NEGATIVE Final   C Diff interpretation Results are indeterminate. See PCR results.  Final    Comment: Performed at [facility], [address] [place], [place] [address]  C. Diff by PCR, Reflexed     Status: Abnormal   Collection Time: 06/28/23  4:00 AM  Result Value Ref Range Status   Toxigenic C. Difficile by PCR POSITIVE (A) NEGATIVE Final  Comment: Positive for toxigenic C. difficile with little to no toxin production. Only treat if clinical  presentation suggests symptomatic illness. Performed at [facility], [address] [place], [place] [address]       Radiology Studies: No results found.    Scheduled Meds:  aspirin EC  81 mg Oral Daily   clopidogrel  75 mg Oral Daily   dicyclomine  10 mg Oral TID AC   feeding supplement  1 Container Oral TID BM   ferrous sulfate  325 mg Oral Daily   folic acid  1 mg Oral Daily   isosorbide mononitrate  120 mg Oral BID   levothyroxine  66 mcg Intravenous Daily   lidocaine  1 patch Transdermal Q12H   midodrine  5 mg Oral TID WC   mometasone-formoterol  2 puff Inhalation BID   montelukast  10 mg Oral QHS   multivitamin with minerals  1 tablet Oral Daily   mouth rinse  15 mL Mouth Rinse TID   pantoprazole (PROTONIX) IV  40 mg Intravenous Q12H   ranolazine  500 mg Oral BID   traZODone  100 mg Oral QHS   vancomycin  125 mg Oral QID   Continuous Infusions:   LOS: 2 days   Time spent: 25 minutes   Noralee Stain, DO [facility] 07/02/2023, 11:23 AM   Available via Epic secure chat 7am-7pm After these hours, please refer to coverage provider listed on [email or web address]

## 2023-07-02 NOTE — TOC CM/SW Note (Signed)
Received return call from sister Dewayne Hatch. She states she thinks they do agree with SNF rec, but wants to talk with patient's son about it before officially deciding. Notified weekday RNCM.  Alfonso Ramus, LCSW Transitions of Care Department [phone]

## 2023-07-02 NOTE — Plan of Care (Signed)

## 2023-07-03 ENCOUNTER — Ambulatory Visit: Payer: Medicare HMO | Admitting: Gastroenterology

## 2023-07-03 DIAGNOSIS — K529 Noninfective gastroenteritis and colitis, unspecified: Secondary | ICD-10-CM | POA: Diagnosis not present

## 2023-07-03 LAB — BASIC METABOLIC PANEL
Anion gap: 6 (ref 5–15)
BUN: 12 mg/dL (ref 8–23)
CO2: 20 mmol/L — ABNORMAL LOW (ref 22–32)
Calcium: 8.5 mg/dL — ABNORMAL LOW (ref 8.9–10.3)
Chloride: 102 mmol/L (ref 98–111)
Creatinine, Ser: 1.33 mg/dL — ABNORMAL HIGH (ref 0.44–1.00)
GFR, Estimated: 42 mL/min — ABNORMAL LOW (ref >60)
Glucose, Bld: 90 mg/dL (ref 70–99)
Potassium: 3.7 mmol/L (ref 3.5–5.1)
Sodium: 128 mmol/L — ABNORMAL LOW (ref 135–145)

## 2023-07-03 LAB — MAGNESIUM: Magnesium: 2.2 mg/dL (ref 1.7–2.4)

## 2023-07-03 MED ORDER — PROSOURCE PLUS PO LIQD
30.0000 mL | Freq: Three times a day (TID) | ORAL | Status: DC
Start: 2023-07-03 — End: 2023-07-09
  Administered 2023-07-03 – 2023-07-09 (×11): 30 mL via ORAL

## 2023-07-03 NOTE — NC FL2 (Signed)
[place] MEDICAID FL2 LEVEL OF CARE FORM     IDENTIFICATION  Patient Name: Abigail Ortega Birthdate: [birth year] Sex: female Admission Date (Current Location): 06/27/2023  [facility] and [place] Number:  Chiropodist and Address:  [facility], [address] [place], [place] [address]      Provider Number: [id number]  Attending Physician Name and Address:  Noralee Stain, DO  Relative Name and Phone Number:  Silvano Bilis  [phone]    Current Level of Care: Hospital Recommended Level of Care: Skilled Nursing Facility Prior Approval Number:    Date Approved/Denied:   PASRR Number: Pending  Discharge Plan: SNF    Current Diagnoses: Patient Active Problem List   Diagnosis Date Noted   Folate deficiency 07/01/2023   Malnutrition of moderate degree 06/30/2023   Hypokalemia 06/28/2023   Acute kidney injury superimposed on chronic kidney disease (HCC) 06/28/2023   Coronary artery disease with stable angina 06/28/2023   Chronic obstructive lung disease (HCC) 06/28/2023   Acute C. difficile colitis 06/27/2023   Dyspepsia 01/30/2023   Prediabetes 01/30/2023   Gastroesophageal reflux disease without esophagitis 01/30/2023   Other irritable bowel syndrome 01/30/2023   Cervicalgia 11/28/2022   Hemorrhoids 11/28/2022   Subacute cough 09/29/2022   COPD (chronic obstructive pulmonary disease) with acute bronchitis (HCC) 09/29/2022   Osteoporosis 10/25/2021   Coronary syndrome, acute (HCC) 08/23/2018   Anal fissure    History of colonic polyps 12/23/2016   Hyponatremia 12/21/2016   GIB (gastrointestinal bleeding) 12/12/2016   Depressive disorder 01/14/2016   Headache 01/14/2016   Joint pain 01/14/2016   Myocardial infarction (HCC) 01/14/2016   Obesity (BMI 35.0-39.9 without comorbidity) 08/20/2015   Anal pain 01/13/2015   Anxiety 12/16/2013   Breast cancer (HCC) 12/16/2013   CAD (coronary artery disease) 12/16/2013    Hyperlipidemia, unspecified 12/16/2013   Chronic hypotension 12/16/2013   Nausea 12/16/2013   Severe hypothyroidism 12/16/2013   Acquired absence of breast 03/04/2013   Chronic combined systolic and diastolic CHF (congestive heart failure) (HCC) 03/04/2013   Breast cancer of lower-inner quadrant of right female breast (HCC) 01/28/2013    Orientation RESPIRATION BLADDER Height & Weight     Self, Time, Situation, Place  Normal Continent Weight: 60 kg Height:  5\' 4"  (162.6 cm)  BEHAVIORAL SYMPTOMS/MOOD NEUROLOGICAL BOWEL NUTRITION STATUS      Incontinent  (See Discharge Summary)  AMBULATORY STATUS COMMUNICATION OF NEEDS Skin   Extensive Assist Verbally Normal                       Personal Care Assistance Level of Assistance  Bathing, Feeding, Dressing Bathing Assistance: Maximum assistance Feeding assistance: Limited assistance Dressing Assistance: Maximum assistance     Functional Limitations Info  Sight, Hearing, Speech Sight Info: Impaired (Wears glasses) Hearing Info: Adequate Speech Info: Adequate    SPECIAL CARE FACTORS FREQUENCY  PT (By licensed PT), OT (By licensed OT)     PT Frequency: 5x weekly OT Frequency: 5x weekly            Contractures Contractures Info: Not present    Additional Factors Info  Code Status, Allergies Code Status Info: DNR-Interven Allergies Info: Nausea Control (Emetrol), Other, Effexor (Venlafaxine), Hydroxyzine           Current Medications (07/03/2023):  This is the current hospital active medication list Current Facility-Administered Medications  Medication Dose Route Frequency Provider Last Rate Last Admin   acetaminophen (TYLENOL) tablet 650 mg  650 mg  Oral Q6H PRN Mansy, Jan A, MD   650 mg at 06/29/23 1610   Or   acetaminophen (TYLENOL) suppository 650 mg  650 mg Rectal Q6H PRN Mansy, Jan A, MD       albuterol (PROVENTIL) (2.5 MG/3ML) 0.083% nebulizer solution 2.5 mg  2.5 mg Nebulization Q6H PRN Otelia Sergeant,  RPH       ALPRAZolam Prudy Feeler) tablet 0.25 mg  0.25 mg Oral BID PRN Mansy, Jan A, MD       aspirin EC tablet 81 mg  81 mg Oral Daily Meredeth Ide, MD   81 mg at 07/03/23 0825   clopidogrel (PLAVIX) tablet 75 mg  75 mg Oral Daily Meredeth Ide, MD   75 mg at 07/03/23 0825   dicyclomine (BENTYL) capsule 10 mg  10 mg Oral TID [facility] Mansy, Jan A, MD   10 mg at 07/03/23 9604   feeding supplement (BOOST / RESOURCE BREEZE) liquid 1 Container  1 Container Oral TID BM Willeen Niece, MD   1 Container at 07/03/23 5409   ferrous sulfate tablet 325 mg  325 mg Oral Daily Mansy, Jan A, MD   325 mg at 07/03/23 0825   folic acid (FOLVITE) tablet 1 mg  1 mg Oral Daily Danford, Earl Lites, MD   1 mg at 07/03/23 0825   HYDROcodone-acetaminophen (NORCO/VICODIN) 5-325 MG per tablet 1 tablet  1 tablet Oral Q6H PRN Mansy, Jan A, MD   1 tablet at 07/01/23 8119   isosorbide mononitrate (IMDUR) 24 hr tablet 120 mg  120 mg Oral BID Mansy, Jan A, MD   120 mg at 07/03/23 1478   levothyroxine (SYNTHROID, LEVOTHROID) injection 66 mcg  66 mcg Intravenous Daily Alberteen Sam, MD   66 mcg at 07/03/23 0836   lidocaine (LIDODERM) 5 % 1 patch  1 patch Transdermal Q12H Mansy, Jan A, MD   1 patch at 07/03/23 0835   midodrine (PROAMATINE) tablet 5 mg  5 mg Oral TID WC Mansy, Jan A, MD   5 mg at 07/03/23 0825   mometasone-formoterol (DULERA) 200-5 MCG/ACT inhaler 2 puff  2 puff Inhalation BID Mansy, Jan A, MD   2 puff at 07/03/23 0825   montelukast (SINGULAIR) tablet 10 mg  10 mg Oral QHS Mansy, Jan A, MD   10 mg at 07/02/23 2208   multivitamin with minerals tablet 1 tablet  1 tablet Oral Daily Willeen Niece, MD   1 tablet at 07/03/23 0825   ondansetron (ZOFRAN) tablet 4 mg  4 mg Oral Q6H PRN Mansy, Jan A, MD       Or   ondansetron [facility]) injection 4 mg  4 mg Intravenous Q6H PRN Mansy, Vernetta Honey, MD       Oral care mouth rinse  15 mL Mouth Rinse TID Noralee Stain, DO   15 mL at 07/03/23 0829   pantoprazole (PROTONIX) EC tablet  40 mg  40 mg Oral BID AC Noralee Stain, DO   40 mg at 07/03/23 0825   ranolazine (RANEXA) 12 hr tablet 500 mg  500 mg Oral BID Mansy, Jan A, MD   500 mg at 07/03/23 0825   traZODone (DESYREL) tablet 100 mg  100 mg Oral QHS Mansy, Jan A, MD   100 mg at 07/02/23 2208   traZODone (DESYREL) tablet 25 mg  25 mg Oral QHS PRN Mansy, Jan A, MD       vancomycin (VANCOCIN) capsule 125 mg  125 mg Oral QID Prince Solian  F, RPH   125 mg at 07/03/23 4696     Discharge Medications: Please see discharge summary for a list of discharge medications.  Relevant Imaging Results:  Relevant Lab Results:   Additional Information [id number]  Garret Reddish, RN

## 2023-07-03 NOTE — Progress Notes (Signed)
PROGRESS NOTE    KARDI LESESNE  JXB:[id number] DOB: December 31, 1950 DOA: 06/27/2023 PCP: Margaretann Loveless, MD     Brief Narrative:  Abigail Ortega is a 72 year old female with past medical history significant for chronic combined systolic and diastolic CHF, hypertension, COPD, hypothyroidism, CAD, hyperlipidemia, mood disorder who presented with severe diarrhea and cramps for over a week.  In the emergency department, patient was found to be hypotensive, creatinine up to 1.9, C. difficile was positive.  Patient was admitted for C. difficile colitis, AKI.  She was started on oral vancomycin.  New events last 24 hours / Subjective: 1 bowel movement documented yesterday, type 7 liquid consistency.  Patient states that her cramping has improved.  Now awaiting SNF placement.  Assessment & Plan:   Principal Problem:   Acute C. difficile colitis Active Problems:   Severe hypothyroidism   Breast cancer of lower-inner quadrant of right female breast (HCC)   Chronic combined systolic and diastolic CHF (congestive heart failure) (HCC)   Chronic hypotension   Hyponatremia   Hypokalemia   Acute kidney injury superimposed on chronic kidney disease (HCC)   Coronary artery disease with stable angina   Chronic obstructive lung disease (HCC)   Malnutrition of moderate degree   Folate deficiency   Acute C. difficile colitis -P.o. vancomycin  -Improving  Severe hypothyroidism -TSH 328, free T40.37 -Started on IV levothyroxine  Folate deficiency -Supplement  Malnutrition of moderate degree -Continue nutrition supplements  COPD -Without acute exacerbation -Inhalers, Singulair, PPI  CAD -Aspirin, Plavix, Ranexa, Imdur  AKI on CKD stage IIIb -Improved, baseline creatinine around 1.4  Chronic hypotension -Midodrine  Chronic combined systolic and diastolic CHF -Holding home Lasix in setting of AKI and C. difficile colitis  Mood disorder/anxiety -Xanax   DVT prophylaxis:   Place and maintain sequential compression device Start: 06/28/23 0946  Code Status: DNR Family Communication: None at bedside Disposition Plan: SNF recommended Status is: Inpatient Remains inpatient appropriate because: SNF placement pending    Antimicrobials:  Anti-infectives (From admission, onward)    Start     Dose/Rate Route Frequency Ordered Stop   06/28/23 2200  cefTRIAXone (ROCEPHIN) 2 g in sodium chloride 0.9 % 100 mL IVPB  Status:  Discontinued        2 g 200 mL/hr over 30 Minutes Intravenous Every 24 hours 06/28/23 0538 06/28/23 0924   06/28/23 1000  metroNIDAZOLE (FLAGYL) IVPB 500 mg  Status:  Discontinued        500 mg 100 mL/hr over 60 Minutes Intravenous Every 8 hours 06/28/23 0538 06/28/23 0944   06/28/23 1000  vancomycin (VANCOCIN) capsule 125 mg        125 mg Oral 4 times daily 06/28/23 0943 07/08/23 0959   06/27/23 2200  ceFEPIme (MAXIPIME) 2 g in sodium chloride 0.9 % 100 mL IVPB        2 g 200 mL/hr over 30 Minutes Intravenous  Once 06/27/23 2147 06/27/23 2256   06/27/23 2200  metroNIDAZOLE (FLAGYL) IVPB 500 mg        500 mg 100 mL/hr over 60 Minutes Intravenous  Once 06/27/23 2147 06/28/23 0237   06/27/23 2200  vancomycin (VANCOREADY) IVPB 1250 mg/250 mL        1,250 mg 166.7 mL/hr over 90 Minutes Intravenous  Once 06/27/23 2149 06/28/23 0237        Objective: Vitals:   07/02/23 0824 07/02/23 1627 07/02/23 2227 07/03/23 0806  BP: 124/61 137/87 106/71 108/62  Pulse: 73 71 75 77  Resp: 16 17 18 17   Temp: (!) 97.5 F (36.4 C) 97.9 F (36.6 C) 98.1 F (36.7 C) 98.9 F (37.2 C)  TempSrc:  Oral Oral Oral  SpO2: 99% 99% 100% 96%  Weight:      Height:       No intake or output data in the 24 hours ending 07/03/23 1102 Filed Weights   06/30/23 0500  Weight: 60 kg    Examination:  General exam: Appears calm and comfortable  Respiratory system: Clear to auscultation. Respiratory effort normal. No respiratory distress. No conversational dyspnea.   Cardiovascular system: S1 & S2 heard, RRR.  Gastrointestinal system: Abdomen is nondistended, soft  Central nervous system: Alert and oriented. No focal neurological deficits. Speech clear.  Extremities: Symmetric in appearance  Skin: No rashes, lesions or ulcers on exposed skin  Psychiatry: Mood & affect appropriate.   Data Reviewed: I have personally reviewed following labs and imaging studies  CBC: Recent Labs  Lab 06/28/23 0632 06/28/23 0634 06/29/23 0545 06/30/23 0253 07/01/23 0630 07/02/23 0242  WBC 10.3 9.9 6.3 5.0 5.8 5.0  NEUTROABS 7.8*  --   --   --   --   --   HGB 8.4* 8.4* 6.4* 7.7* 9.5* 8.7*  HCT 22.9* 23.3* 18.8* 21.8* 27.1* 25.5*  MCV 89.8 90.7 94.9 89.3 89.1 89.5  PLT 266 256 235 202 251 239   Basic Metabolic Panel: Recent Labs  Lab 06/29/23 0545 06/30/23 0253 07/01/23 0630 07/02/23 0242 07/03/23 0416  NA 135 130* 128* 128* 128*  K 3.4* 3.0* 4.8 4.2 3.7  CL 109 102 103 103 102  CO2 20* 18* 17* 19* 20*  GLUCOSE 72 85 73 83 90  BUN 22 19 14 14 12   CREATININE 1.66* 1.46* 1.41* 1.47* 1.33*  CALCIUM 7.4* 7.4* 8.4* 8.4* 8.5*  MG 2.1 1.9 1.8 1.6* 2.2   GFR: Estimated Creatinine Clearance: 32.5 mL/min (A) (by C-G formula based on SCr of 1.33 mg/dL (H)). Liver Function Tests: Recent Labs  Lab 06/27/23 2019 07/02/23 0242  AST 99* 53*  ALT 57* 29  ALKPHOS 123 87  BILITOT 1.9* 0.8  PROT 7.2 5.4*  ALBUMIN 3.3* 2.4*   No results for input(s): "LIPASE", "AMYLASE" in the last 168 hours. No results for input(s): "AMMONIA" in the last 168 hours. Coagulation Profile: Recent Labs  Lab 06/27/23 2019  INR 1.0   Cardiac Enzymes: No results for input(s): "CKTOTAL", "CKMB", "CKMBINDEX", "TROPONINI" in the last 168 hours. BNP (last 3 results) No results for input(s): "PROBNP" in the last 8760 hours. HbA1C: No results for input(s): "HGBA1C" in the last 72 hours. CBG: No results for input(s): "GLUCAP" in the last 168 hours. Lipid Profile: No results for  input(s): "CHOL", "HDL", "LDLCALC", "TRIG", "CHOLHDL", "LDLDIRECT" in the last 72 hours. Thyroid Function Tests: Recent Labs    07/01/23 0630  TSH 328.000*  FREET4 0.37*   Anemia Panel: No results for input(s): "VITAMINB12", "FOLATE", "FERRITIN", "TIBC", "IRON", "RETICCTPCT" in the last 72 hours. Sepsis Labs: Recent Labs  Lab 06/27/23 2104 06/28/23 0046  LATICACIDVEN 2.2* 1.3    Recent Results (from the past 240 hours)  Blood Culture (routine x 2)     Status: None   Collection Time: 06/27/23  9:04 PM   Specimen: BLOOD  Result Value Ref Range Status   Specimen Description BLOOD BLOOD LEFT ARM  Final   Special Requests   Final    BOTTLES DRAWN AEROBIC AND ANAEROBIC Blood Culture results may not be optimal due  to an inadequate volume of blood received in culture bottles   Culture   Final    NO GROWTH 5 DAYS Performed at [facility], [address] [place], [place] [address]    Report Status 07/02/2023 FINAL  Final  Blood Culture (routine x 2)     Status: None   Collection Time: 06/27/23  9:04 PM   Specimen: BLOOD  Result Value Ref Range Status   Specimen Description BLOOD BLOOD RIGHT ARM  Final   Special Requests   Final    BOTTLES DRAWN AEROBIC AND ANAEROBIC Blood Culture results may not be optimal due to an inadequate volume of blood received in culture bottles   Culture   Final    NO GROWTH 5 DAYS Performed at [facility], [address] [place], [place] [address]    Report Status 07/02/2023 FINAL  Final  Gastrointestinal Panel by PCR , Stool     Status: None   Collection Time: 06/28/23  4:00 AM   Specimen: Stool  Result Value Ref Range Status   Campylobacter species NOT DETECTED NOT DETECTED Final   Plesimonas shigelloides NOT DETECTED NOT DETECTED Final   Salmonella species NOT DETECTED NOT DETECTED Final   Yersinia enterocolitica NOT DETECTED NOT DETECTED Final   Vibrio species NOT DETECTED NOT DETECTED Final   Vibrio cholerae NOT  DETECTED NOT DETECTED Final   Enteroaggregative E coli (EAEC) NOT DETECTED NOT DETECTED Final   Enteropathogenic E coli (EPEC) NOT DETECTED NOT DETECTED Final   Enterotoxigenic E coli (ETEC) NOT DETECTED NOT DETECTED Final   Shiga like toxin producing E coli (STEC) NOT DETECTED NOT DETECTED Final   Shigella/Enteroinvasive E coli (EIEC) NOT DETECTED NOT DETECTED Final   Cryptosporidium NOT DETECTED NOT DETECTED Final   Cyclospora cayetanensis NOT DETECTED NOT DETECTED Final   Entamoeba histolytica NOT DETECTED NOT DETECTED Final   Giardia lamblia NOT DETECTED NOT DETECTED Final   Adenovirus F40/41 NOT DETECTED NOT DETECTED Final   Astrovirus NOT DETECTED NOT DETECTED Final   Norovirus GI/GII NOT DETECTED NOT DETECTED Final   Rotavirus A NOT DETECTED NOT DETECTED Final   Sapovirus (I, II, IV, and V) NOT DETECTED NOT DETECTED Final    Comment: Performed at [facility], [address] [place], [place] [address]  C Difficile Quick Screen w PCR reflex     Status: Abnormal   Collection Time: 06/28/23  4:00 AM   Specimen: Stool  Result Value Ref Range Status   C Diff antigen POSITIVE (A) NEGATIVE Final   C Diff toxin NEGATIVE NEGATIVE Final   C Diff interpretation Results are indeterminate. See PCR results.  Final    Comment: Performed at [facility], [address] [place], [place] [address]  C. Diff by PCR, Reflexed     Status: Abnormal   Collection Time: 06/28/23  4:00 AM  Result Value Ref Range Status   Toxigenic C. Difficile by PCR POSITIVE (A) NEGATIVE Final    Comment: Positive for toxigenic C. difficile with little to no toxin production. Only treat if clinical presentation suggests symptomatic illness. Performed at [facility], [address] [place] [address], [place], [place] [address]       Radiology Studies: No results found.    Scheduled Meds:  aspirin EC  81 mg Oral Daily   clopidogrel  75 mg Oral Daily   dicyclomine  10 mg Oral TID AC   feeding  supplement  1 Container Oral TID BM   ferrous sulfate  325 mg Oral Daily   folic acid  1 mg Oral Daily   isosorbide mononitrate  120 mg Oral BID   levothyroxine  66 mcg Intravenous Daily   lidocaine  1 patch Transdermal Q12H   midodrine  5 mg Oral TID WC   mometasone-formoterol  2 puff Inhalation BID   montelukast  10 mg Oral QHS   multivitamin with minerals  1 tablet Oral Daily   mouth rinse  15 mL Mouth Rinse TID   pantoprazole  40 mg Oral BID AC   ranolazine  500 mg Oral BID   traZODone  100 mg Oral QHS   vancomycin  125 mg Oral QID   Continuous Infusions:   LOS: 3 days   Time spent: 25 minutes   Noralee Stain, DO [facility] 07/03/2023, 11:02 AM   Available via Epic secure chat 7am-7pm After these hours, please refer to coverage provider listed on [email or web address]

## 2023-07-03 NOTE — TOC Transition Note (Signed)
Transition of Care [facility]) - Discharge Note   Patient Details  Name: Abigail Ortega MRN: [id number] Date of Birth: 1951/01/26  Transition of Care [facility]) CM/SW Contact:  Garret Reddish, RN Phone Number: 07/03/2023, 3:34 PM   Clinical Narrative:    Chart reviewed.  Fl 2 completed and Bed search completed to [facility] area. Patient flagged for Level 2 [place].  Information sent to [facility].    TOC will continue to follow for discharge planning.            Patient Goals and CMS Choice            Discharge Placement                       Discharge Plan and Services Additional resources added to the After Visit Summary for                                       Social Drivers of Health (SDOH) Interventions SDOH Screenings   Food Insecurity: No Food Insecurity (06/28/2023)  Housing: Unknown (06/28/2023)  Transportation Needs: No Transportation Needs (06/28/2023)  Utilities: Not At Risk (06/28/2023)  Tobacco Use: Medium Risk (06/27/2023)     Readmission Risk Interventions     No data to display

## 2023-07-03 NOTE — Progress Notes (Signed)
Nutrition Follow-up  DOCUMENTATION CODES:   Non-severe (moderate) malnutrition in context of chronic illness  INTERVENTION:   -Continue dysphagia 3 diet -Continue MVI with minerals daily -Continue Boost Breeze po TID, each supplement provides 250 kcal and 9 grams of protein  -30 ml Prosource Plus TID, each supplement provides 100 kcals and 15 grams  NUTRITION DIAGNOSIS:   Moderate Malnutrition related to chronic illness (CHF) as evidenced by mild fat depletion, moderate fat depletion, mild muscle depletion, moderate muscle depletion.  Ongoing  GOAL:   Patient will meet greater than or equal to 90% of their needs  Progressing   MONITOR:   PO intake, Supplement acceptance  REASON FOR ASSESSMENT:   Consult Assessment of nutrition requirement/status  ASSESSMENT:   Pt with medical history significant for CHF, anxiety, depression, GERD, CKD, hypertension IBS, hypothyroidism and IBS, who presented with acute onset of diarrhea for the last 3 weeks PTA with associated nausea and vomiting as well as left lower quadrant abdominal pain and occasional back pain.  12/12- s/p BSE- dysphagia 3 diet with thin liquids   Reviewed I/O's: +801 ml since admission   Pt sleeping soundly at time of visit. Pt did not respond to voice or touch. No family at bedside.   Pt remains on a dysphagia 3 diet. Noted meal completions 0-15%. Pt is drinking Boost Breeze supplements.   Reviewed wt hx; pt has experienced a 5.5% wt loss over the past 3 months, which is not significant for time frame.   Per TOC notes, plan to d/c to SNF once medically stable for discharge.   Medications reviewed and include ferrous sulfate, folic acid, and protonix.   Labs reviewed: Na: 128, CBGS: 151 (inpatient orders for glycemic control are none).    Diet Order:   Diet Order             DIET DYS 3 Room service appropriate? Yes; Fluid consistency: Thin  Diet effective now                   EDUCATION NEEDS:    Education needs have been addressed  Skin:  Skin Assessment: Reviewed RN Assessment  Last BM:  07/03/23 (type 6)  Height:   Ht Readings from Last 1 Encounters:  06/29/23 5\' 4"  (1.626 m)    Weight:   Wt Readings from Last 1 Encounters:  06/30/23 60 kg    Ideal Body Weight:  56.8 kg  BMI:  Body mass index is 22.71 kg/m.  Estimated Nutritional Needs:   Kcal:  1700-1900  Protein:  90-105 grams  Fluid:  > 1.7 L    Levada Schilling, RD, LDN, CDCES Registered Dietitian III Certified Diabetes Care and Education Specialist If unable to reach this RD, please use "RD Inpatient" group chat on secure chat between hours of 8am-4 pm daily

## 2023-07-03 NOTE — Progress Notes (Signed)
OT Cancellation Note  Patient Details Name: MARIYANNA COLLI MRN: [id number] DOB: [birth year]   Cancelled Treatment:    Reason Eval/Treat Not Completed: Other (comment) (pt stating she already got up today and would like to eat lunch)  Alvester Morin 07/03/2023, 3:37 PM

## 2023-07-03 NOTE — Plan of Care (Signed)

## 2023-07-03 NOTE — Progress Notes (Signed)
RE:  Abigail Ortega Date of Birth: 14-Aug-1950 Date: 07/03/2023     To Whom It May Concern:   Please be advised that the above-named patient will require a short-term nursing home stay - anticipated 30 days or less for rehabilitation and strengthening.  The plan is for return home

## 2023-07-03 NOTE — Progress Notes (Signed)
Physical Therapy Treatment Patient Details Name: Abigail Ortega MRN: [id number] DOB: 1951-01-28 Today's Date: 07/03/2023   History of Present Illness Abigail Ortega is a 72 y.o. female with medical history significant for CHF, anxiety, depression, GERD, CKD, hypertension IBS, hypothyroidism and IBS, who presented to the emergency room with acute onset of diarrhea for the last 3 weeks with associated nausea and vomiting as well as left lower quadrant abdominal pain and occasional back pain. Patient with Cdiff colitis.    PT Comments  Pt is making gradual progress with activity tolerance performing standing EOB with STEDY.  Pt peformed sit<> stands pullup with STEADY Mod A +2 for safety but at end of session performed Mod A +1 and then sit<>stand Mod A +1 without STEDY pushing up to stand from recliner. Performed Bed to recliner transfer with STEDY Mod A +1.  Pt reported feeling weak and dizzy after standing BP (in L LE) 126/96, RN notified.  Continued PT will assist pt towards greater activity tolerance, safety, and decrease burden on care during mobility.   If plan is discharge home, recommend the following: A lot of help with bathing/dressing/bathroom;Assistance with cooking/housework;Direct supervision/assist for medications management;Help with stairs or ramp for entrance   Can travel by private vehicle     No  Equipment Recommendations  None recommended by PT    Recommendations for Other Services       Precautions / Restrictions Precautions Precautions: Fall Required Braces or Orthoses: Splint/Cast Splint/Cast: has splint in room for L wrist-unsure of purpose, was going to outpatient at [facility] for AROM/stretching and splint made there Restrictions Weight Bearing Restrictions Per Provider Order: No     Mobility  Bed Mobility Overal bed mobility: Needs Assistance Bed Mobility: Supine to Sit     Supine to sit: Contact guard, Used rails     General bed mobility  comments: increased time and cues needed    Transfers Overall transfer level: Needs assistance Equipment used:  (STEDY for sit<>stands and bed to recliner transfer.) Transfers: Bed to chair/wheelchair/BSC, Sit to/from Stand Sit to Stand: Mod assist (STEDY)           General transfer comment: Pt peformed sit<> stands pullup with STEADY Mod A +2 for safety but at end of session performed Mod A +1 and then sit<>stand Mod A without STEDY pushing up to stand from recliner.  Performed Bed to recliner transfer with STEDY Mod A +1. Transfer via Lift Equipment: Stedy  Ambulation/Gait               General Gait Details: unable to take any steps. Limited standing time.   Stairs             Wheelchair Mobility     Tilt Bed    Modified Rankin (Stroke Patients Only)       Balance Overall balance assessment: Needs assistance Sitting-balance support: Feet supported, Single extremity supported Sitting balance-Leahy Scale: Fair Sitting balance - Comments: Pt leaning to the R and having difficulty self correcting back to midline. Postural control: Right lateral lean Standing balance support: Bilateral upper extremity supported Standing balance-Leahy Scale: Fair Standing balance comment: Mod A holding STEDY bar for support.                            Cognition Arousal: Alert Behavior During Therapy: WFL for tasks assessed/performed Overall Cognitive Status: No family/caregiver present to determine baseline cognitive functioning  Exercises      General Comments        Pertinent Vitals/Pain Pain Assessment Faces Pain Scale: Hurts a little bit Pain Location: Back, back of L foot Pain Descriptors / Indicators: Grimacing, Guarding, Sore    Home Living                          Prior Function            PT Goals (current goals can now be found in the care plan section) Acute Rehab PT  Goals Patient Stated Goal: to improve, decrease pain PT Goal Formulation: With patient Time For Goal Achievement: 07/12/23 Potential to Achieve Goals: Fair Progress towards PT goals: Progressing toward goals    Frequency    Min 1X/week      PT Plan      Co-evaluation              AM-PAC PT "6 Clicks" Mobility   Outcome Measure  Help needed turning from your back to your side while in a flat bed without using bedrails?: A Little Help needed moving from lying on your back to sitting on the side of a flat bed without using bedrails?: A Little Help needed moving to and from a bed to a chair (including a wheelchair)?: A Lot Help needed standing up from a chair using your arms (e.g., wheelchair or bedside chair)?: A Lot Help needed to walk in hospital room?: Total Help needed climbing 3-5 steps with a railing? : Total 6 Click Score: 12    End of Session Equipment Utilized During Treatment: Gait belt Activity Tolerance: Other (comment) (weakness) Patient left: in chair;with call bell/phone within reach;with chair alarm set Nurse Communication: Mobility status PT Visit Diagnosis: Other abnormalities of gait and mobility (R26.89);Muscle weakness (generalized) (M62.81);History of falling (Z91.81);Difficulty in walking, not elsewhere classified (R26.2);Unsteadiness on feet (R26.81);Pain Pain - Right/Left: Left Pain - part of body: Leg     Time: 1010-1058 PT Time Calculation (min) (ACUTE ONLY): 48 min  Charges:    $Therapeutic Activity: 53-67 mins PT General Charges $$ ACUTE PT VISIT: 1 Visit                     Hortencia Conradi, PTA  07/03/23, 11:24 AM

## 2023-07-04 DIAGNOSIS — K529 Noninfective gastroenteritis and colitis, unspecified: Secondary | ICD-10-CM | POA: Diagnosis not present

## 2023-07-04 LAB — BASIC METABOLIC PANEL
Anion gap: 9 (ref 5–15)
BUN: 11 mg/dL (ref 8–23)
CO2: 19 mmol/L — ABNORMAL LOW (ref 22–32)
Calcium: 8.5 mg/dL — ABNORMAL LOW (ref 8.9–10.3)
Chloride: 102 mmol/L (ref 98–111)
Creatinine, Ser: 1.25 mg/dL — ABNORMAL HIGH (ref 0.44–1.00)
GFR, Estimated: 46 mL/min — ABNORMAL LOW (ref >60)
Glucose, Bld: 87 mg/dL (ref 70–99)
Potassium: 3.2 mmol/L — ABNORMAL LOW (ref 3.5–5.1)
Sodium: 130 mmol/L — ABNORMAL LOW (ref 135–145)

## 2023-07-04 MED ORDER — POTASSIUM CHLORIDE CRYS ER 20 MEQ PO TBCR
40.0000 meq | EXTENDED_RELEASE_TABLET | ORAL | Status: AC
Start: 2023-07-04 — End: 2023-07-04
  Administered 2023-07-04 (×2): 40 meq via ORAL
  Filled 2023-07-04 (×2): qty 2

## 2023-07-04 MED ORDER — LEVOTHYROXINE SODIUM 88 MCG PO TABS
88.0000 ug | ORAL_TABLET | Freq: Every day | ORAL | Status: DC
Start: 2023-07-05 — End: 2023-07-09
  Administered 2023-07-05 – 2023-07-09 (×5): 88 ug via ORAL
  Filled 2023-07-04 (×5): qty 1

## 2023-07-04 NOTE — Progress Notes (Signed)
Physical Therapy Treatment Patient Details Name: LADONNE LIETZKE MRN: [id number] DOB: [birth year] Today's Date: 07/04/2023   History of Present Illness SITLALI FAZEL is a 72 y.o. female with medical history significant for CHF, anxiety, depression, GERD, CKD, hypertension IBS, hypothyroidism and IBS, who presented to the emergency room with acute onset of diarrhea for the last 3 weeks with associated nausea and vomiting as well as left lower quadrant abdominal pain and occasional back pain. Patient with Cdiff colitis.    PT Comments  Pt supine in bed upon arrival, agreeable to PT session this date.  Patient able to complete bed mobility with Min to Mod A, with intermittent posterior lean seated. Improvements with multimodal cues and positioning. Patient was total A for peri care due to BM. Patient agreeable to trial STS with RW, able to complete x 2 trials with Min A +2, cues for hand placement, forward lean and weight shift due to continued posterior bias in standing as well. Without use of AD, completed stand pivot transfer with Mod A +2 from bed > chair. Patient left seated in recliner with all needs in reach and chair alarm set. Patient will continue to benefit from acute skilled PT services to address impairments and maximize functional mobility. Will continue to follow acutely.    If plan is discharge home, recommend the following: A lot of help with bathing/dressing/bathroom;Assistance with cooking/housework;Direct supervision/assist for medications management;Help with stairs or ramp for entrance   Can travel by private vehicle     No  Equipment Recommendations  Other (comment) (TBD at next venue of care)    Recommendations for Other Services       Precautions / Restrictions Precautions Precautions: Fall Required Braces or Orthoses: Splint/Cast Splint/Cast: has splint in room for L wrist-unsure of purpose, was going to outpatient at [facility] for AROM/stretching and splint made  there Restrictions Weight Bearing Restrictions Per Provider Order: No     Mobility  Bed Mobility Overal bed mobility: Needs Assistance Bed Mobility: Supine to Sit Rolling: Used rails, Min assist   Supine to sit: Used rails, Mod assist     General bed mobility comments: Min A to roll to R side in bed to be cleaned after BM with placement of L hand on bed rail, minimal grip noted; Mod A to reach seated EOB and varying levels of assist for balance from CGA to Min A d/t intermittent posterior lean    Transfers Overall transfer level: Needs assistance   Transfers: Bed to chair/wheelchair/BSC, Sit to/from Stand Sit to Stand: Min assist, +2 physical assistance Stand pivot transfers: Mod assist, +2 physical assistance         General transfer comment: Min A for STS from EOB, x 2 reps, with cues for hand placement, anterior/forward weight shift. Pt did well with LUE on RW, and use of RUE to push from surface. Mod A x2 for SPT from bed to recliner without use of AD    Ambulation/Gait                   Stairs             Wheelchair Mobility     Tilt Bed    Modified Rankin (Stroke Patients Only)       Balance Overall balance assessment: Needs assistance Sitting-balance support: Feet supported, Single extremity supported Sitting balance-Leahy Scale: Fair Sitting balance - Comments: posterior lean when attempting to don sock require intermittetn assist with dynamic seated balance Postural control: Posterior  lean Standing balance support: Bilateral upper extremity supported, Reliant on assistive device for balance, During functional activity Standing balance-Leahy Scale: Poor Standing balance comment: needs external support and use of RW for standing, poor posture intermittent posterior lean                            Cognition Arousal: Alert Behavior During Therapy: WFL for tasks assessed/performed Overall Cognitive Status: No family/caregiver  present to determine baseline cognitive functioning                                          Exercises      General Comments        Pertinent Vitals/Pain Pain Assessment Pain Assessment: Faces Faces Pain Scale: Hurts a little bit Pain Location: L wrist with PROM/stretch Pain Descriptors / Indicators: Grimacing, Guarding, Sore Pain Intervention(s): Monitored during session    Home Living                          Prior Function            PT Goals (current goals can now be found in the care plan section) Acute Rehab PT Goals PT Goal Formulation: With patient Time For Goal Achievement: 07/12/23 Potential to Achieve Goals: Fair Progress towards PT goals: Progressing toward goals    Frequency    Min 1X/week      PT Plan      Co-evaluation   Reason for Co-Treatment: To address functional/ADL transfers;Necessary to address cognition/behavior during functional activity;For patient/therapist safety PT goals addressed during session: Mobility/safety with mobility;Balance OT goals addressed during session: ADL's and self-care      AM-PAC PT "6 Clicks" Mobility   Outcome Measure  Help needed turning from your back to your side while in a flat bed without using bedrails?: A Little Help needed moving from lying on your back to sitting on the side of a flat bed without using bedrails?: A Little Help needed moving to and from a bed to a chair (including a wheelchair)?: A Lot Help needed standing up from a chair using your arms (e.g., wheelchair or bedside chair)?: A Lot Help needed to walk in hospital room?: Total Help needed climbing 3-5 steps with a railing? : Total 6 Click Score: 12    End of Session Equipment Utilized During Treatment: Gait belt Activity Tolerance: Patient tolerated treatment well Patient left: in chair;with chair alarm set;with call bell/phone within reach Nurse Communication: Mobility status PT Visit Diagnosis: Other  abnormalities of gait and mobility (R26.89);Muscle weakness (generalized) (M62.81);History of falling (Z91.81);Difficulty in walking, not elsewhere classified (R26.2);Unsteadiness on feet (R26.81);Pain Pain - Right/Left: Left Pain - part of body: Leg     Time: 9811-9147 PT Time Calculation (min) (ACUTE ONLY): 24 min  Charges:    $Therapeutic Activity: 23-37 mins PT General Charges $$ ACUTE PT VISIT: 1 Visit                     Howie Ill, PT, DPT 07/04/23 12:37 PM\

## 2023-07-04 NOTE — Progress Notes (Signed)
Occupational Therapy Treatment Patient Details Name: Abigail Ortega MRN: [id number] DOB: 05-Oct-1950 Today's Date: 07/04/2023   History of present illness Abigail Ortega is a 72 y.o. female with medical history significant for CHF, anxiety, depression, GERD, CKD, hypertension IBS, hypothyroidism and IBS, who presented to the emergency room with acute onset of diarrhea for the last 3 weeks with associated nausea and vomiting as well as left lower quadrant abdominal pain and occasional back pain. Patient with Cdiff colitis.   OT comments  Pt is supine in bed on arrival. Pleasant and agreeable to PT/OT session to maximize pt/therapist safety. She grimaced in minimal pain with PROM/stretch to L wrist. Pt performed rolling to R side with Min A and placement on L hand on bedrail in order to perform posterior hygiene after BM in bed-requiring total/Max A. She needed Mod A for supine to sit at EOB with cueing and use of rail. Attempted LB dressing to don socks while seated EOB, but pt unable to maintain seated balance to do so as she was noted to have a posterior lean ultimately requiring Max A to don socks. 2 STS trials from EOB to RW with Min A x2 and cueing for proper forward weight shift d/t posterior bias. Stabilized LUE on RW, but did not appear to push down weight through it. She then performed SPT from bed to recliner with Mod A x2. She was left seated in recliner with all needs in place and will cont to require skilled acute OT services to maximize her safety and IND to return to PLOF.       If plan is discharge home, recommend the following:  Two people to help with walking and/or transfers;A lot of help with bathing/dressing/bathroom;Assistance with cooking/housework;Help with stairs or ramp for entrance;Assist for transportation   Equipment Recommendations  Other (comment) (defer)    Recommendations for Other Services      Precautions / Restrictions Precautions Precautions:  Fall Required Braces or Orthoses: Splint/Cast Splint/Cast: has splint in room for L wrist-unsure of purpose, was going to outpatient at [facility] for AROM/stretching and splint made there Restrictions Weight Bearing Restrictions Per Provider Order: No       Mobility Bed Mobility Overal bed mobility: Needs Assistance Bed Mobility: Supine to Sit Rolling: Used rails, Min assist   Supine to sit: Used rails, Mod assist     General bed mobility comments: Min A to roll to R side in bed to be cleaned after BM with placement of L hand on bed rail; Mod A to reach seated EOB and varying levels of assist for balance from CGA to Min A d/t posterior lean with dynamic activity    Transfers Overall transfer level: Needs assistance   Transfers: Bed to chair/wheelchair/BSC, Sit to/from Stand Sit to Stand: Min assist, +2 physical assistance Stand pivot transfers: Mod assist, +2 physical assistance         General transfer comment: Min A x2 for x2STS from EOB; Mod A x2 for SPT from bed to recliner; cueing for hand placement on RW had pt prop LUE onto RW for STS, however pt not putting weight/more so stabilizing on walker     Balance Overall balance assessment: Needs assistance Sitting-balance support: Feet supported, Single extremity supported Sitting balance-Leahy Scale: Fair Sitting balance - Comments: posterior lean when attempting to don socks and sporadically during session requiring cueing for forward weight shift   Standing balance support: Bilateral upper extremity supported, Reliant on assistive device for balance, During functional  activity Standing balance-Leahy Scale: Poor Standing balance comment: needs external support and use of RW for standing                           ADL either performed or assessed with clinical judgement   ADL Overall ADL's : Needs assistance/impaired                     Lower Body Dressing: Maximal assistance;Sitting/lateral leans Lower  Body Dressing Details (indicate cue type and reason): to don socks seated EOB after pt attempt, but did not have the balance     Toileting- Clothing Manipulation and Hygiene: Total assistance;Maximal assistance;Bed level Toileting - Clothing Manipulation Details (indicate cue type and reason): total assist for posterior hygiene at bed level            Extremity/Trunk Assessment Upper Extremity Assessment Upper Extremity Assessment: Right hand dominant;LUE deficits/detail LUE Deficits / Details: L wrist fx in Aug-went to outpatient at [facility] and had splint made and pt to perform AROM/PROM daily   Lower Extremity Assessment Lower Extremity Assessment: Generalized weakness        Vision       Perception     Praxis      Cognition Arousal: Alert Behavior During Therapy: WFL for tasks assessed/performed Overall Cognitive Status: No family/caregiver present to determine baseline cognitive functioning                                          Exercises      Shoulder Instructions       General Comments      Pertinent Vitals/ Pain       Pain Assessment Pain Assessment: Faces Faces Pain Scale: Hurts a little bit Pain Location: L wrist with PROM/stretch Pain Descriptors / Indicators: Grimacing, Guarding, Sore Pain Intervention(s): Monitored during session  Home Living                                          Prior Functioning/Environment              Frequency  Min 1X/week        Progress Toward Goals  OT Goals(current goals can now be found in the care plan section)  Progress towards OT goals: Progressing toward goals  Acute Rehab OT Goals Patient Stated Goal: improve strength OT Goal Formulation: With patient Time For Goal Achievement: 07/13/23 Potential to Achieve Goals: Good  Plan      Co-evaluation    PT/OT/SLP Co-Evaluation/Treatment: Yes Reason for Co-Treatment: To address functional/ADL transfers;Necessary  to address cognition/behavior during functional activity;For patient/therapist safety PT goals addressed during session: Mobility/safety with mobility;Balance OT goals addressed during session: ADL's and self-care      AM-PAC OT "6 Clicks" Daily Activity     Outcome Measure   Help from another person eating meals?: None Help from another person taking care of personal grooming?: A Little Help from another person toileting, which includes using toliet, bedpan, or urinal?: A Lot Help from another person bathing (including washing, rinsing, drying)?: A Lot Help from another person to put on and taking off regular upper body clothing?: A Lot Help from another person to put on and taking off regular lower body clothing?:  A Lot 6 Click Score: 15    End of Session Equipment Utilized During Treatment: Rolling walker (2 wheels)  OT Visit Diagnosis: Other abnormalities of gait and mobility (R26.89);Unsteadiness on feet (R26.81);Muscle weakness (generalized) (M62.81)   Activity Tolerance Patient tolerated treatment well   Patient Left with call bell/phone within reach;in chair;with chair alarm set   Nurse Communication Mobility status        Time: 1610-9604 OT Time Calculation (min): 24 min  Charges: OT General Charges $OT Visit: 1 Visit OT Treatments $Self Care/Home Management : 8-22 mins  Ambrose Wile, OTR/L  07/04/23, 11:52 AM   Constance Goltz 07/04/2023, 11:49 AM

## 2023-07-04 NOTE — Plan of Care (Signed)

## 2023-07-04 NOTE — Progress Notes (Signed)
PROGRESS NOTE    MARANATHA NEWSHAM  WGN:[id number] DOB: 1951-02-03 DOA: 06/27/2023 PCP: Margaretann Loveless, MD     Brief Narrative:  LOUVENA FESS is a 72 year old female with past medical history significant for chronic combined systolic and diastolic CHF, hypertension, COPD, hypothyroidism, CAD, hyperlipidemia, mood disorder who presented with severe diarrhea and cramps for over a week.  In the emergency department, patient was found to be hypotensive, creatinine up to 1.9, C. difficile was positive.  Patient was admitted for C. difficile colitis, AKI.  She was started on oral vancomycin.  New events last 24 hours / Subjective: 4 bowel movements documented yesterday, type 7 liquid consistency.  Patient states that her cramping has improved.  Now awaiting SNF placement.  Assessment & Plan:   Principal Problem:   Acute C. difficile colitis Active Problems:   Severe hypothyroidism   Breast cancer of lower-inner quadrant of right female breast (HCC)   Chronic combined systolic and diastolic CHF (congestive heart failure) (HCC)   Chronic hypotension   Hyponatremia   Hypokalemia   Acute kidney injury superimposed on chronic kidney disease (HCC)   Coronary artery disease with stable angina   Chronic obstructive lung disease (HCC)   Malnutrition of moderate degree   Folate deficiency   Acute C. difficile colitis -P.o. vancomycin  -Improving  Severe hypothyroidism -TSH 328, free T40.37 -Started on IV levothyroxine --> PO  -Repeat lab work in several weeks  Folate deficiency -Supplement  Malnutrition of moderate degree -Continue nutrition supplements  COPD -Without acute exacerbation -Inhalers, Singulair, PPI  CAD -Aspirin, Plavix, Ranexa, Imdur  AKI on CKD stage IIIb -Improved, baseline creatinine around 1.4  Chronic hypotension -Midodrine  Chronic combined systolic and diastolic CHF -Holding home Lasix in setting of AKI and C. difficile colitis  Mood  disorder/anxiety -Xanax  Hypokalemia -Replace   DVT prophylaxis:  Place and maintain sequential compression device Start: 06/28/23 0946  Code Status: DNR Family Communication: None at bedside Disposition Plan: SNF recommended Status is: Inpatient Remains inpatient appropriate because: SNF placement pending    Antimicrobials:  Anti-infectives (From admission, onward)    Start     Dose/Rate Route Frequency Ordered Stop   06/28/23 2200  cefTRIAXone (ROCEPHIN) 2 g in sodium chloride 0.9 % 100 mL IVPB  Status:  Discontinued        2 g 200 mL/hr over 30 Minutes Intravenous Every 24 hours 06/28/23 0538 06/28/23 0924   06/28/23 1000  metroNIDAZOLE (FLAGYL) IVPB 500 mg  Status:  Discontinued        500 mg 100 mL/hr over 60 Minutes Intravenous Every 8 hours 06/28/23 0538 06/28/23 0944   06/28/23 1000  vancomycin (VANCOCIN) capsule 125 mg        125 mg Oral 4 times daily 06/28/23 0943 07/08/23 0959   06/27/23 2200  ceFEPIme (MAXIPIME) 2 g in sodium chloride 0.9 % 100 mL IVPB        2 g 200 mL/hr over 30 Minutes Intravenous  Once 06/27/23 2147 06/27/23 2256   06/27/23 2200  metroNIDAZOLE (FLAGYL) IVPB 500 mg        500 mg 100 mL/hr over 60 Minutes Intravenous  Once 06/27/23 2147 06/28/23 0237   06/27/23 2200  vancomycin (VANCOREADY) IVPB 1250 mg/250 mL        1,250 mg 166.7 mL/hr over 90 Minutes Intravenous  Once 06/27/23 2149 06/28/23 0237        Objective: Vitals:   07/03/23 1514 07/03/23 2028 07/04/23 0022 07/04/23 0830  BP: 96/60 106/75 97/65 (!) 90/55  Pulse: 83 75 82 81  Resp: 15  16 16   Temp: 98.1 F (36.7 C)  99.1 F (37.3 C) 98 F (36.7 C)  TempSrc:    Oral  SpO2: 90%  99% 99%  Weight:      Height:        Intake/Output Summary (Last 24 hours) at 07/04/2023 1024 Last data filed at 07/03/2023 2025 Gross per 24 hour  Intake 240 ml  Output --  Net 240 ml   Filed Weights   06/30/23 0500  Weight: 60 kg    Examination:  General exam: Appears calm and  comfortable  Respiratory system: Respiratory effort normal. No respiratory distress. No conversational dyspnea.  Gastrointestinal system: Abdomen is nondistended, soft, nontender to palpation Central nervous system: Alert and oriented. No focal neurological deficits. Speech clear.  Extremities: Symmetric in appearance  Skin: No rashes, lesions or ulcers on exposed skin  Psychiatry: Mood & affect appropriate.   Data Reviewed: I have personally reviewed following labs and imaging studies  CBC: Recent Labs  Lab 06/28/23 0632 06/28/23 0634 06/29/23 0545 06/30/23 0253 07/01/23 0630 07/02/23 0242  WBC 10.3 9.9 6.3 5.0 5.8 5.0  NEUTROABS 7.8*  --   --   --   --   --   HGB 8.4* 8.4* 6.4* 7.7* 9.5* 8.7*  HCT 22.9* 23.3* 18.8* 21.8* 27.1* 25.5*  MCV 89.8 90.7 94.9 89.3 89.1 89.5  PLT 266 256 235 202 251 239   Basic Metabolic Panel: Recent Labs  Lab 06/29/23 0545 06/30/23 0253 07/01/23 0630 07/02/23 0242 07/03/23 0416 07/04/23 0310  NA 135 130* 128* 128* 128* 130*  K 3.4* 3.0* 4.8 4.2 3.7 3.2*  CL 109 102 103 103 102 102  CO2 20* 18* 17* 19* 20* 19*  GLUCOSE 72 85 73 83 90 87  BUN 22 19 14 14 12 11   CREATININE 1.66* 1.46* 1.41* 1.47* 1.33* 1.25*  CALCIUM 7.4* 7.4* 8.4* 8.4* 8.5* 8.5*  MG 2.1 1.9 1.8 1.6* 2.2  --    GFR: Estimated Creatinine Clearance: 34.6 mL/min (A) (by C-G formula based on SCr of 1.25 mg/dL (H)). Liver Function Tests: Recent Labs  Lab 06/27/23 2019 07/02/23 0242  AST 99* 53*  ALT 57* 29  ALKPHOS 123 87  BILITOT 1.9* 0.8  PROT 7.2 5.4*  ALBUMIN 3.3* 2.4*   No results for input(s): "LIPASE", "AMYLASE" in the last 168 hours. No results for input(s): "AMMONIA" in the last 168 hours. Coagulation Profile: Recent Labs  Lab 06/27/23 2019  INR 1.0   Cardiac Enzymes: No results for input(s): "CKTOTAL", "CKMB", "CKMBINDEX", "TROPONINI" in the last 168 hours. BNP (last 3 results) No results for input(s): "PROBNP" in the last 8760 hours. HbA1C: No  results for input(s): "HGBA1C" in the last 72 hours. CBG: No results for input(s): "GLUCAP" in the last 168 hours. Lipid Profile: No results for input(s): "CHOL", "HDL", "LDLCALC", "TRIG", "CHOLHDL", "LDLDIRECT" in the last 72 hours. Thyroid Function Tests: No results for input(s): "TSH", "T4TOTAL", "FREET4", "T3FREE", "THYROIDAB" in the last 72 hours.  Anemia Panel: No results for input(s): "VITAMINB12", "FOLATE", "FERRITIN", "TIBC", "IRON", "RETICCTPCT" in the last 72 hours. Sepsis Labs: Recent Labs  Lab 06/27/23 2104 06/28/23 0046  LATICACIDVEN 2.2* 1.3    Recent Results (from the past 240 hours)  Blood Culture (routine x 2)     Status: None   Collection Time: 06/27/23  9:04 PM   Specimen: BLOOD  Result Value Ref Range Status  Specimen Description BLOOD BLOOD LEFT ARM  Final   Special Requests   Final    BOTTLES DRAWN AEROBIC AND ANAEROBIC Blood Culture results may not be optimal due to an inadequate volume of blood received in culture bottles   Culture   Final    NO GROWTH 5 DAYS Performed at [facility], [address] [place], [place] [address]    Report Status 07/02/2023 FINAL  Final  Blood Culture (routine x 2)     Status: None   Collection Time: 06/27/23  9:04 PM   Specimen: BLOOD  Result Value Ref Range Status   Specimen Description BLOOD BLOOD RIGHT ARM  Final   Special Requests   Final    BOTTLES DRAWN AEROBIC AND ANAEROBIC Blood Culture results may not be optimal due to an inadequate volume of blood received in culture bottles   Culture   Final    NO GROWTH 5 DAYS Performed at [facility], [address] [place], [place] [address]    Report Status 07/02/2023 FINAL  Final  Gastrointestinal Panel by PCR , Stool     Status: None   Collection Time: 06/28/23  4:00 AM   Specimen: Stool  Result Value Ref Range Status   Campylobacter species NOT DETECTED NOT DETECTED Final   Plesimonas shigelloides NOT DETECTED NOT DETECTED Final    Salmonella species NOT DETECTED NOT DETECTED Final   Yersinia enterocolitica NOT DETECTED NOT DETECTED Final   Vibrio species NOT DETECTED NOT DETECTED Final   Vibrio cholerae NOT DETECTED NOT DETECTED Final   Enteroaggregative E coli (EAEC) NOT DETECTED NOT DETECTED Final   Enteropathogenic E coli (EPEC) NOT DETECTED NOT DETECTED Final   Enterotoxigenic E coli (ETEC) NOT DETECTED NOT DETECTED Final   Shiga like toxin producing E coli (STEC) NOT DETECTED NOT DETECTED Final   Shigella/Enteroinvasive E coli (EIEC) NOT DETECTED NOT DETECTED Final   Cryptosporidium NOT DETECTED NOT DETECTED Final   Cyclospora cayetanensis NOT DETECTED NOT DETECTED Final   Entamoeba histolytica NOT DETECTED NOT DETECTED Final   Giardia lamblia NOT DETECTED NOT DETECTED Final   Adenovirus F40/41 NOT DETECTED NOT DETECTED Final   Astrovirus NOT DETECTED NOT DETECTED Final   Norovirus GI/GII NOT DETECTED NOT DETECTED Final   Rotavirus A NOT DETECTED NOT DETECTED Final   Sapovirus (I, II, IV, and V) NOT DETECTED NOT DETECTED Final    Comment: Performed at [facility], [address] [place], [place] [address]  C Difficile Quick Screen w PCR reflex     Status: Abnormal   Collection Time: 06/28/23  4:00 AM   Specimen: Stool  Result Value Ref Range Status   C Diff antigen POSITIVE (A) NEGATIVE Final   C Diff toxin NEGATIVE NEGATIVE Final   C Diff interpretation Results are indeterminate. See PCR results.  Final    Comment: Performed at [facility], [address] [place], [place] [address]  C. Diff by PCR, Reflexed     Status: Abnormal   Collection Time: 06/28/23  4:00 AM  Result Value Ref Range Status   Toxigenic C. Difficile by PCR POSITIVE (A) NEGATIVE Final    Comment: Positive for toxigenic C. difficile with little to no toxin production. Only treat if clinical presentation suggests symptomatic illness. Performed at [facility], [address], [place], [place] [address]        Radiology Studies: No results found.    Scheduled Meds:  (feeding supplement) PROSource Plus  30 mL  Oral TID BM   aspirin EC  81 mg Oral Daily   clopidogrel  75 mg Oral Daily   dicyclomine  10 mg Oral TID AC   feeding supplement  1 Container Oral TID BM   ferrous sulfate  325 mg Oral Daily   folic acid  1 mg Oral Daily   isosorbide mononitrate  120 mg Oral BID   [START ON 07/05/2023] levothyroxine  88 mcg Oral Q0600   lidocaine  1 patch Transdermal Q12H   midodrine  5 mg Oral TID WC   mometasone-formoterol  2 puff Inhalation BID   montelukast  10 mg Oral QHS   multivitamin with minerals  1 tablet Oral Daily   mouth rinse  15 mL Mouth Rinse TID   pantoprazole  40 mg Oral BID AC   potassium chloride  40 mEq Oral Q4H   ranolazine  500 mg Oral BID   traZODone  100 mg Oral QHS   vancomycin  125 mg Oral QID   Continuous Infusions:   LOS: 4 days   Time spent: 25 minutes   Noralee Stain, DO [facility] 07/04/2023, 10:24 AM   Available via Epic secure chat 7am-7pm After these hours, please refer to coverage provider listed on [email or web address]

## 2023-07-05 DIAGNOSIS — K529 Noninfective gastroenteritis and colitis, unspecified: Secondary | ICD-10-CM | POA: Diagnosis not present

## 2023-07-05 LAB — BASIC METABOLIC PANEL
Anion gap: 8 (ref 5–15)
BUN: 12 mg/dL (ref 8–23)
CO2: 17 mmol/L — ABNORMAL LOW (ref 22–32)
Calcium: 8.8 mg/dL — ABNORMAL LOW (ref 8.9–10.3)
Chloride: 104 mmol/L (ref 98–111)
Creatinine, Ser: 1.37 mg/dL — ABNORMAL HIGH (ref 0.44–1.00)
GFR, Estimated: 41 mL/min — ABNORMAL LOW (ref >60)
Glucose, Bld: 80 mg/dL (ref 70–99)
Potassium: 4.2 mmol/L (ref 3.5–5.1)
Sodium: 129 mmol/L — ABNORMAL LOW (ref 135–145)

## 2023-07-05 LAB — MAGNESIUM: Magnesium: 1.8 mg/dL (ref 1.7–2.4)

## 2023-07-05 LAB — TSH: TSH: 270 u[IU]/mL — ABNORMAL HIGH (ref 0.350–4.500)

## 2023-07-05 MED ORDER — SENNOSIDES-DOCUSATE SODIUM 8.6-50 MG PO TABS: 1.0000 | ORAL_TABLET | Freq: Every evening | ORAL | Status: DC | PRN

## 2023-07-05 MED ORDER — GUAIFENESIN 100 MG/5ML PO LIQD: 5.0000 mL | ORAL | Status: DC | PRN

## 2023-07-05 MED ORDER — METOPROLOL TARTRATE 5 MG/5ML IV SOLN: 5.0000 mg | INTRAVENOUS | Status: DC | PRN

## 2023-07-05 MED ORDER — IPRATROPIUM-ALBUTEROL 0.5-2.5 (3) MG/3ML IN SOLN: 3.0000 mL | RESPIRATORY_TRACT | Status: DC | PRN

## 2023-07-05 MED ORDER — HYDRALAZINE HCL 20 MG/ML IJ SOLN: 10.0000 mg | INTRAMUSCULAR | Status: DC | PRN

## 2023-07-05 NOTE — Care Management Important Message (Signed)
Important Message  Patient Details  Name: EVAYA BRIDSON MRN: [id number] Date of Birth: 1951-05-17   Important Message Given:  Yes - Medicare IM  Left IM with nurse as pt is on precautions.   Bernadette Hoit 07/05/2023, 9:45 AM

## 2023-07-05 NOTE — Plan of Care (Signed)

## 2023-07-05 NOTE — Progress Notes (Signed)
PROGRESS NOTE    Abigail Ortega  ZOX:[id number] DOB: 1950/09/01 DOA: 06/27/2023 PCP: Margaretann Loveless, MD    Brief Narrative:  72 year old female with past medical history significant for chronic combined systolic and diastolic CHF, hypertension, COPD, hypothyroidism, CAD, hyperlipidemia, mood disorder who presented with severe diarrhea and cramps for over a week. In the emergency department, patient was found to be hypotensive, creatinine up to 1.9, C. difficile was positive. Patient was admitted for C. difficile colitis, AKI. She was started on oral vancomycin  Was also noted to have severe hypothyroidism with elevated TSH and low free T4.  Initially received IV Synthroid and then transition to p.o.  Assessment & Plan:  Principal Problem:   Acute C. difficile colitis Active Problems:   Severe hypothyroidism   Breast cancer of lower-inner quadrant of right female breast (HCC)   Chronic combined systolic and diastolic CHF (congestive heart failure) (HCC)   Chronic hypotension   Hyponatremia   Hypokalemia   Acute kidney injury superimposed on chronic kidney disease (HCC)   Coronary artery disease with stable angina   Chronic obstructive lung disease (HCC)   Malnutrition of moderate degree   Folate deficiency     Acute C. difficile colitis Improving on p.o. vancomycin.  EOT 12/21   Severe hypothyroidism -TSH 328, free T4 0.37 -Started on IV levothyroxine --> PO  -Repeat lab work in several weeks   Folate deficiency -Supplement   Malnutrition of moderate degree -Continue nutrition supplements   COPD -Without acute exacerbation -Inhalers, Singulair, PPI   CAD -Aspirin, Plavix, Ranexa, Imdur   AKI on CKD stage IIIb -Improved, baseline creatinine around 1.4   Chronic hypotension -Midodrine   Chronic combined systolic and diastolic CHF -Holding home Lasix in setting of AKI and C. difficile colitis   Mood disorder/anxiety -Xanax   Hypokalemia -Replace      DVT prophylaxis: Place and maintain sequential compression device Start: 06/28/23 0946  Code Status: DNR Family Communication: None at bedside Disposition Plan: SNF recommended Status is: Inpatient Remains inpatient appropriate because: SNF placement pending.  Medically cleared   Subjective:  No complaints.  Occasional watery stool but significantly improved  Examination:  General exam: Appears calm and comfortable, cachectic frail Respiratory system: Clear to auscultation. Respiratory effort normal. Cardiovascular system: S1 & S2 heard, RRR. No JVD, murmurs, rubs, gallops or clicks. No pedal edema. Gastrointestinal system: Abdomen is nondistended, soft and nontender. No organomegaly or masses felt. Normal bowel sounds heard. Central nervous system: Alert and oriented. No focal neurological deficits. Extremities: Symmetric 5 x 5 power. Skin: No rashes, lesions or ulcers Psychiatry: Judgement and insight appear normal. Mood & affect appropriate.                Diet Orders (From admission, onward)     Start     Ordered   06/28/23 0945  DIET DYS 3 Room service appropriate? Yes; Fluid consistency: Thin  Diet effective now       Question Answer Comment  Room service appropriate? Yes   Fluid consistency: Thin      06/28/23 0944            Objective: Vitals:   07/04/23 0830 07/04/23 1514 07/05/23 0011 07/05/23 0728  BP: (!) 90/55 102/67 99/66 116/69  Pulse: 81 77 77 76  Resp: 16 18 18 16   Temp: 98 F (36.7 C) 98.1 F (36.7 C) 98.5 F (36.9 C) 99 F (37.2 C)  TempSrc: Oral Oral Oral   SpO2: 99% 100%  100% 99%  Weight:      Height:        Intake/Output Summary (Last 24 hours) at 07/05/2023 1253 Last data filed at 07/04/2023 2000 Gross per 24 hour  Intake 240 ml  Output --  Net 240 ml   Filed Weights   06/30/23 0500  Weight: 60 kg    Scheduled Meds:  (feeding supplement) PROSource Plus  30 mL Oral TID BM   aspirin EC  81 mg Oral Daily    clopidogrel  75 mg Oral Daily   dicyclomine  10 mg Oral TID AC   feeding supplement  1 Container Oral TID BM   ferrous sulfate  325 mg Oral Daily   folic acid  1 mg Oral Daily   isosorbide mononitrate  120 mg Oral BID   levothyroxine  88 mcg Oral Q0600   lidocaine  1 patch Transdermal Q12H   midodrine  5 mg Oral TID WC   mometasone-formoterol  2 puff Inhalation BID   montelukast  10 mg Oral QHS   multivitamin with minerals  1 tablet Oral Daily   mouth rinse  15 mL Mouth Rinse TID   pantoprazole  40 mg Oral BID AC   ranolazine  500 mg Oral BID   traZODone  100 mg Oral QHS   vancomycin  125 mg Oral QID   Continuous Infusions:  Nutritional status Signs/Symptoms: mild fat depletion, moderate fat depletion, mild muscle depletion, moderate muscle depletion Interventions: Boost Breeze, MVI Body mass index is 22.71 kg/m.  Data Reviewed:   CBC: Recent Labs  Lab 06/29/23 0545 06/30/23 0253 07/01/23 0630 07/02/23 0242  WBC 6.3 5.0 5.8 5.0  HGB 6.4* 7.7* 9.5* 8.7*  HCT 18.8* 21.8* 27.1* 25.5*  MCV 94.9 89.3 89.1 89.5  PLT 235 202 251 239   Basic Metabolic Panel: Recent Labs  Lab 06/30/23 0253 07/01/23 0630 07/02/23 0242 07/03/23 0416 07/04/23 0310 07/05/23 0518  NA 130* 128* 128* 128* 130* 129*  K 3.0* 4.8 4.2 3.7 3.2* 4.2  CL 102 103 103 102 102 104  CO2 18* 17* 19* 20* 19* 17*  GLUCOSE 85 73 83 90 87 80  BUN 19 14 14 12 11 12   CREATININE 1.46* 1.41* 1.47* 1.33* 1.25* 1.37*  CALCIUM 7.4* 8.4* 8.4* 8.5* 8.5* 8.8*  MG 1.9 1.8 1.6* 2.2  --  1.8   GFR: Estimated Creatinine Clearance: 31.6 mL/min (A) (by C-G formula based on SCr of 1.37 mg/dL (H)). Liver Function Tests: Recent Labs  Lab 07/02/23 0242  AST 53*  ALT 29  ALKPHOS 87  BILITOT 0.8  PROT 5.4*  ALBUMIN 2.4*   No results for input(s): "LIPASE", "AMYLASE" in the last 168 hours. No results for input(s): "AMMONIA" in the last 168 hours. Coagulation Profile: No results for input(s): "INR", "PROTIME" in  the last 168 hours. Cardiac Enzymes: No results for input(s): "CKTOTAL", "CKMB", "CKMBINDEX", "TROPONINI" in the last 168 hours. BNP (last 3 results) No results for input(s): "PROBNP" in the last 8760 hours. HbA1C: No results for input(s): "HGBA1C" in the last 72 hours. CBG: No results for input(s): "GLUCAP" in the last 168 hours. Lipid Profile: No results for input(s): "CHOL", "HDL", "LDLCALC", "TRIG", "CHOLHDL", "LDLDIRECT" in the last 72 hours. Thyroid Function Tests: No results for input(s): "TSH", "T4TOTAL", "FREET4", "T3FREE", "THYROIDAB" in the last 72 hours. Anemia Panel: No results for input(s): "VITAMINB12", "FOLATE", "FERRITIN", "TIBC", "IRON", "RETICCTPCT" in the last 72 hours. Sepsis Labs: No results for input(s): "PROCALCITON", "LATICACIDVEN" in the  last 168 hours.  Recent Results (from the past 240 hours)  Blood Culture (routine x 2)     Status: None   Collection Time: 06/27/23  9:04 PM   Specimen: BLOOD  Result Value Ref Range Status   Specimen Description BLOOD BLOOD LEFT ARM  Final   Special Requests   Final    BOTTLES DRAWN AEROBIC AND ANAEROBIC Blood Culture results may not be optimal due to an inadequate volume of blood received in culture bottles   Culture   Final    NO GROWTH 5 DAYS Performed at [facility], [address] [place], [place] [address]    Report Status 07/02/2023 FINAL  Final  Blood Culture (routine x 2)     Status: None   Collection Time: 06/27/23  9:04 PM   Specimen: BLOOD  Result Value Ref Range Status   Specimen Description BLOOD BLOOD RIGHT ARM  Final   Special Requests   Final    BOTTLES DRAWN AEROBIC AND ANAEROBIC Blood Culture results may not be optimal due to an inadequate volume of blood received in culture bottles   Culture   Final    NO GROWTH 5 DAYS Performed at [facility], [address] [place], [place] [address]    Report Status 07/02/2023 FINAL  Final  Gastrointestinal Panel by PCR , Stool      Status: None   Collection Time: 06/28/23  4:00 AM   Specimen: Stool  Result Value Ref Range Status   Campylobacter species NOT DETECTED NOT DETECTED Final   Plesimonas shigelloides NOT DETECTED NOT DETECTED Final   Salmonella species NOT DETECTED NOT DETECTED Final   Yersinia enterocolitica NOT DETECTED NOT DETECTED Final   Vibrio species NOT DETECTED NOT DETECTED Final   Vibrio cholerae NOT DETECTED NOT DETECTED Final   Enteroaggregative E coli (EAEC) NOT DETECTED NOT DETECTED Final   Enteropathogenic E coli (EPEC) NOT DETECTED NOT DETECTED Final   Enterotoxigenic E coli (ETEC) NOT DETECTED NOT DETECTED Final   Shiga like toxin producing E coli (STEC) NOT DETECTED NOT DETECTED Final   Shigella/Enteroinvasive E coli (EIEC) NOT DETECTED NOT DETECTED Final   Cryptosporidium NOT DETECTED NOT DETECTED Final   Cyclospora cayetanensis NOT DETECTED NOT DETECTED Final   Entamoeba histolytica NOT DETECTED NOT DETECTED Final   Giardia lamblia NOT DETECTED NOT DETECTED Final   Adenovirus F40/41 NOT DETECTED NOT DETECTED Final   Astrovirus NOT DETECTED NOT DETECTED Final   Norovirus GI/GII NOT DETECTED NOT DETECTED Final   Rotavirus A NOT DETECTED NOT DETECTED Final   Sapovirus (I, II, IV, and V) NOT DETECTED NOT DETECTED Final    Comment: Performed at [facility], [address] [place], [place] [address]  C Difficile Quick Screen w PCR reflex     Status: Abnormal   Collection Time: 06/28/23  4:00 AM   Specimen: Stool  Result Value Ref Range Status   C Diff antigen POSITIVE (A) NEGATIVE Final   C Diff toxin NEGATIVE NEGATIVE Final   C Diff interpretation Results are indeterminate. See PCR results.  Final    Comment: Performed at [facility], [address] [place], [place] [address]  C. Diff by PCR, Reflexed     Status: Abnormal   Collection Time: 06/28/23  4:00 AM  Result Value Ref Range Status   Toxigenic C. Difficile by PCR POSITIVE (A) NEGATIVE Final    Comment:  Positive for toxigenic C. difficile with little to no toxin production. Only  treat if clinical presentation suggests symptomatic illness. Performed at [facility], [address], [place], [place] [address]          Radiology Studies: No results found.         LOS: 5 days   Time spent= 35 mins    Miguel Rota, MD [facility]  If 7PM-7AM, please contact night-coverage  07/05/2023, 12:53 PM

## 2023-07-05 NOTE — Progress Notes (Signed)
PROGRESS NOTE    Abigail Ortega  ZOX:[id number] DOB: [birth year] DOA: 06/27/2023 PCP: Margaretann Loveless, MD    Brief Narrative:  72 year old female with past medical history significant for chronic combined systolic and diastolic CHF, hypertension, COPD, hypothyroidism, CAD, hyperlipidemia, mood disorder who presented with severe diarrhea and cramps for over a week. In the emergency department, patient was found to be hypotensive, creatinine up to 1.9, C. difficile was positive. Patient was admitted for C. difficile colitis, AKI. She was started on oral vancomycin  Was also noted to have severe hypothyroidism with elevated TSH and low free T4.  Initially received IV Synthroid and then transition to p.o.  Assessment & Plan:  Principal Problem:   Acute C. difficile colitis Active Problems:   Severe hypothyroidism   Breast cancer of lower-inner quadrant of right female breast (HCC)   Chronic combined systolic and diastolic CHF (congestive heart failure) (HCC)   Chronic hypotension   Hyponatremia   Hypokalemia   Acute kidney injury superimposed on chronic kidney disease (HCC)   Coronary artery disease with stable angina   Chronic obstructive lung disease (HCC)   Malnutrition of moderate degree   Folate deficiency     Acute C. difficile colitis Improving on p.o. vancomycin.  EOT 12/21   Severe hypothyroidism -TSH 328, free T4 0.37 -Started on IV levothyroxine --> PO  -Repeat lab work in several weeks   Folate deficiency -Supplement   Malnutrition of moderate degree -Continue nutrition supplements   COPD -Without acute exacerbation -Inhalers, Singulair, PPI   CAD -Aspirin, Plavix, Ranexa, Imdur   AKI on CKD stage IIIb -Improved, baseline creatinine around 1.4   Chronic hypotension -Midodrine   Chronic combined systolic and diastolic CHF -Holding home Lasix in setting of AKI and C. difficile colitis   Mood disorder/anxiety -Xanax   Hypokalemia -Replace      DVT prophylaxis: Place and maintain sequential compression device Start: 06/28/23 0946  Code Status: DNR Family Communication: None at bedside Disposition Plan: SNF recommended Status is: Inpatient Remains inpatient appropriate because: SNF placement pending.  Medically cleared   Subjective:  No complaints.  Occasional watery stool but significantly improved  Examination:  General exam: Appears calm and comfortable, cachectic frail Respiratory system: Clear to auscultation. Respiratory effort normal. Cardiovascular system: S1 & S2 heard, RRR. No JVD, murmurs, rubs, gallops or clicks. No pedal edema. Gastrointestinal system: Abdomen is nondistended, soft and nontender. No organomegaly or masses felt. Normal bowel sounds heard. Central nervous system: Alert and oriented. No focal neurological deficits. Extremities: Symmetric 5 x 5 power. Skin: No rashes, lesions or ulcers Psychiatry: Judgement and insight appear normal. Mood & affect appropriate.                Diet Orders (From admission, onward)     Start     Ordered   06/28/23 0945  DIET DYS 3 Room service appropriate? Yes; Fluid consistency: Thin  Diet effective now       Question Answer Comment  Room service appropriate? Yes   Fluid consistency: Thin      06/28/23 0944            Objective: Vitals:   07/04/23 0830 07/04/23 1514 07/05/23 0011 07/05/23 0728  BP: (!) 90/55 102/67 99/66 116/69  Pulse: 81 77 77 76  Resp: 16 18 18 16   Temp: 98 F (36.7 C) 98.1 F (36.7 C) 98.5 F (36.9 C) 99 F (37.2 C)  TempSrc: Oral Oral Oral   SpO2: 99% 100%  100% 99%  Weight:      Height:        Intake/Output Summary (Last 24 hours) at 07/05/2023 1252 Last data filed at 07/04/2023 2000 Gross per 24 hour  Intake 240 ml  Output --  Net 240 ml   Filed Weights   06/30/23 0500  Weight: 60 kg    Scheduled Meds:  (feeding supplement) PROSource Plus  30 mL Oral TID BM   aspirin EC  81 mg Oral Daily    clopidogrel  75 mg Oral Daily   dicyclomine  10 mg Oral TID AC   feeding supplement  1 Container Oral TID BM   ferrous sulfate  325 mg Oral Daily   folic acid  1 mg Oral Daily   isosorbide mononitrate  120 mg Oral BID   levothyroxine  88 mcg Oral Q0600   lidocaine  1 patch Transdermal Q12H   midodrine  5 mg Oral TID WC   mometasone-formoterol  2 puff Inhalation BID   montelukast  10 mg Oral QHS   multivitamin with minerals  1 tablet Oral Daily   mouth rinse  15 mL Mouth Rinse TID   pantoprazole  40 mg Oral BID AC   ranolazine  500 mg Oral BID   traZODone  100 mg Oral QHS   vancomycin  125 mg Oral QID   Continuous Infusions:  Nutritional status Signs/Symptoms: mild fat depletion, moderate fat depletion, mild muscle depletion, moderate muscle depletion Interventions: Boost Breeze, MVI Body mass index is 22.71 kg/m.  Data Reviewed:   CBC: Recent Labs  Lab 06/29/23 0545 06/30/23 0253 07/01/23 0630 07/02/23 0242  WBC 6.3 5.0 5.8 5.0  HGB 6.4* 7.7* 9.5* 8.7*  HCT 18.8* 21.8* 27.1* 25.5*  MCV 94.9 89.3 89.1 89.5  PLT 235 202 251 239   Basic Metabolic Panel: Recent Labs  Lab 06/30/23 0253 07/01/23 0630 07/02/23 0242 07/03/23 0416 07/04/23 0310 07/05/23 0518  NA 130* 128* 128* 128* 130* 129*  K 3.0* 4.8 4.2 3.7 3.2* 4.2  CL 102 103 103 102 102 104  CO2 18* 17* 19* 20* 19* 17*  GLUCOSE 85 73 83 90 87 80  BUN 19 14 14 12 11 12   CREATININE 1.46* 1.41* 1.47* 1.33* 1.25* 1.37*  CALCIUM 7.4* 8.4* 8.4* 8.5* 8.5* 8.8*  MG 1.9 1.8 1.6* 2.2  --  1.8   GFR: Estimated Creatinine Clearance: 31.6 mL/min (A) (by C-G formula based on SCr of 1.37 mg/dL (H)). Liver Function Tests: Recent Labs  Lab 07/02/23 0242  AST 53*  ALT 29  ALKPHOS 87  BILITOT 0.8  PROT 5.4*  ALBUMIN 2.4*   No results for input(s): "LIPASE", "AMYLASE" in the last 168 hours. No results for input(s): "AMMONIA" in the last 168 hours. Coagulation Profile: No results for input(s): "INR", "PROTIME" in  the last 168 hours. Cardiac Enzymes: No results for input(s): "CKTOTAL", "CKMB", "CKMBINDEX", "TROPONINI" in the last 168 hours. BNP (last 3 results) No results for input(s): "PROBNP" in the last 8760 hours. HbA1C: No results for input(s): "HGBA1C" in the last 72 hours. CBG: No results for input(s): "GLUCAP" in the last 168 hours. Lipid Profile: No results for input(s): "CHOL", "HDL", "LDLCALC", "TRIG", "CHOLHDL", "LDLDIRECT" in the last 72 hours. Thyroid Function Tests: No results for input(s): "TSH", "T4TOTAL", "FREET4", "T3FREE", "THYROIDAB" in the last 72 hours. Anemia Panel: No results for input(s): "VITAMINB12", "FOLATE", "FERRITIN", "TIBC", "IRON", "RETICCTPCT" in the last 72 hours. Sepsis Labs: No results for input(s): "PROCALCITON", "LATICACIDVEN" in the  last 168 hours.  Recent Results (from the past 240 hours)  Blood Culture (routine x 2)     Status: None   Collection Time: 06/27/23  9:04 PM   Specimen: BLOOD  Result Value Ref Range Status   Specimen Description BLOOD BLOOD LEFT ARM  Final   Special Requests   Final    BOTTLES DRAWN AEROBIC AND ANAEROBIC Blood Culture results may not be optimal due to an inadequate volume of blood received in culture bottles   Culture   Final    NO GROWTH 5 DAYS Performed at [facility], [address] [place], [place] [address]    Report Status 07/02/2023 FINAL  Final  Blood Culture (routine x 2)     Status: None   Collection Time: 06/27/23  9:04 PM   Specimen: BLOOD  Result Value Ref Range Status   Specimen Description BLOOD BLOOD RIGHT ARM  Final   Special Requests   Final    BOTTLES DRAWN AEROBIC AND ANAEROBIC Blood Culture results may not be optimal due to an inadequate volume of blood received in culture bottles   Culture   Final    NO GROWTH 5 DAYS Performed at [facility], [address] [place], [place] [address]    Report Status 07/02/2023 FINAL  Final  Gastrointestinal Panel by PCR , Stool      Status: None   Collection Time: 06/28/23  4:00 AM   Specimen: Stool  Result Value Ref Range Status   Campylobacter species NOT DETECTED NOT DETECTED Final   Plesimonas shigelloides NOT DETECTED NOT DETECTED Final   Salmonella species NOT DETECTED NOT DETECTED Final   Yersinia enterocolitica NOT DETECTED NOT DETECTED Final   Vibrio species NOT DETECTED NOT DETECTED Final   Vibrio cholerae NOT DETECTED NOT DETECTED Final   Enteroaggregative E coli (EAEC) NOT DETECTED NOT DETECTED Final   Enteropathogenic E coli (EPEC) NOT DETECTED NOT DETECTED Final   Enterotoxigenic E coli (ETEC) NOT DETECTED NOT DETECTED Final   Shiga like toxin producing E coli (STEC) NOT DETECTED NOT DETECTED Final   Shigella/Enteroinvasive E coli (EIEC) NOT DETECTED NOT DETECTED Final   Cryptosporidium NOT DETECTED NOT DETECTED Final   Cyclospora cayetanensis NOT DETECTED NOT DETECTED Final   Entamoeba histolytica NOT DETECTED NOT DETECTED Final   Giardia lamblia NOT DETECTED NOT DETECTED Final   Adenovirus F40/41 NOT DETECTED NOT DETECTED Final   Astrovirus NOT DETECTED NOT DETECTED Final   Norovirus GI/GII NOT DETECTED NOT DETECTED Final   Rotavirus A NOT DETECTED NOT DETECTED Final   Sapovirus (I, II, IV, and V) NOT DETECTED NOT DETECTED Final    Comment: Performed at [facility], [address] [place], [place] [address]  C Difficile Quick Screen w PCR reflex     Status: Abnormal   Collection Time: 06/28/23  4:00 AM   Specimen: Stool  Result Value Ref Range Status   C Diff antigen POSITIVE (A) NEGATIVE Final   C Diff toxin NEGATIVE NEGATIVE Final   C Diff interpretation Results are indeterminate. See PCR results.  Final    Comment: Performed at [facility], [address] [place], [place] [address]  C. Diff by PCR, Reflexed     Status: Abnormal   Collection Time: 06/28/23  4:00 AM  Result Value Ref Range Status   Toxigenic C. Difficile by PCR POSITIVE (A) NEGATIVE Final    Comment:  Positive for toxigenic C. difficile with little to no toxin production. Only  treat if clinical presentation suggests symptomatic illness. Performed at [facility], [address] [place], [place] [address]          Radiology Studies: No results found.         LOS: 5 days   Time spent= 35 mins    Miguel Rota, MD [facility]  If 7PM-7AM, please contact night-coverage  07/05/2023, 12:52 PM

## 2023-07-05 NOTE — TOC Progression Note (Signed)
Transition of Care [facility]) - Progression Note    Patient Details  Name: Abigail Ortega MRN: [id number] Date of Birth: 12/22/1950  Transition of [facility]) CM/SW Contact  Garret Reddish, RN Phone Number: 07/05/2023, 2:06 PM  Clinical Narrative:     SNF authorization pending for [employer].  Pending SNF authorization is [phone].  TOC will continue to follow for discharge planning.         Expected Discharge Plan and Services                                               Social Determinants of Health (SDOH) Interventions SDOH Screenings   Food Insecurity: No Food Insecurity (06/28/2023)  Housing: Unknown (06/28/2023)  Transportation Needs: No Transportation Needs (06/28/2023)  Utilities: Not At Risk (06/28/2023)  Tobacco Use: Medium Risk (06/27/2023)    Readmission Risk Interventions     No data to display

## 2023-07-06 DIAGNOSIS — K529 Noninfective gastroenteritis and colitis, unspecified: Secondary | ICD-10-CM | POA: Diagnosis not present

## 2023-07-06 LAB — BASIC METABOLIC PANEL
Anion gap: 5 (ref 5–15)
BUN: 12 mg/dL (ref 8–23)
CO2: 22 mmol/L (ref 22–32)
Calcium: 8.4 mg/dL — ABNORMAL LOW (ref 8.9–10.3)
Chloride: 103 mmol/L (ref 98–111)
Creatinine, Ser: 1.19 mg/dL — ABNORMAL HIGH (ref 0.44–1.00)
GFR, Estimated: 48 mL/min — ABNORMAL LOW (ref >60)
Glucose, Bld: 87 mg/dL (ref 70–99)
Potassium: 3.8 mmol/L (ref 3.5–5.1)
Sodium: 130 mmol/L — ABNORMAL LOW (ref 135–145)

## 2023-07-06 LAB — CBC
HCT: 23 % — ABNORMAL LOW (ref 36.0–46.0)
Hemoglobin: 8.1 g/dL — ABNORMAL LOW (ref 12.0–15.0)
MCH: 31.8 pg (ref 26.0–34.0)
MCHC: 35.2 g/dL (ref 30.0–36.0)
MCV: 90.2 fL (ref 80.0–100.0)
Platelets: 275 10*3/uL (ref 150–400)
RBC: 2.55 MIL/uL — ABNORMAL LOW (ref 3.87–5.11)
RDW: 16.5 % — ABNORMAL HIGH (ref 11.5–15.5)
WBC: 6.3 10*3/uL (ref 4.0–10.5)
nRBC: 0 % (ref 0.0–0.2)

## 2023-07-06 LAB — PHOSPHORUS: Phosphorus: 2.5 mg/dL (ref 2.5–4.6)

## 2023-07-06 LAB — MAGNESIUM: Magnesium: 1.6 mg/dL — ABNORMAL LOW (ref 1.7–2.4)

## 2023-07-06 NOTE — Plan of Care (Signed)

## 2023-07-06 NOTE — TOC Progression Note (Signed)
Transition of Care [facility]) - Progression Note    Patient Details  Name: Abigail Ortega MRN: [id number] Date of Birth: June 18, 1951  [facility]) CM/SW Contact  Garret Reddish, RN Phone Number: 07/06/2023, 10:37 AM  Clinical Narrative:     Chart reviewed.  SNF authorization still pending.   TOC will continue to follow for discharge planning.         Expected Discharge Plan and Services                                               Social Determinants of Health (SDOH) Interventions SDOH Screenings   Food Insecurity: No Food Insecurity (06/28/2023)  Housing: Unknown (06/28/2023)  Transportation Needs: No Transportation Needs (06/28/2023)  Utilities: Not At Risk (06/28/2023)  Tobacco Use: Medium Risk (06/27/2023)    Readmission Risk Interventions     No data to display

## 2023-07-06 NOTE — Progress Notes (Signed)
PROGRESS NOTE    Abigail Ortega  ZOX:[id number] DOB: [birth year] DOA: 06/27/2023 PCP: Margaretann Loveless, MD    Brief Narrative:  72 year old female with past medical history significant for chronic combined systolic and diastolic CHF, hypertension, COPD, hypothyroidism, CAD, hyperlipidemia, mood disorder who presented with severe diarrhea and cramps for over a week. In the emergency department, patient was found to be hypotensive, creatinine up to 1.9, C. difficile was positive. Patient was admitted for C. difficile colitis, AKI. She was started on oral vancomycin  Was also noted to have severe hypothyroidism with elevated TSH and low free T4.  Initially received IV Synthroid and then transition to p.o. TSH level is slowly improving.  PT/OT recommended SNF.  TOC working on placement.  Otherwise medically cleared  Assessment & Plan:  Principal Problem:   Acute C. difficile colitis Active Problems:   Severe hypothyroidism   Breast cancer of lower-inner quadrant of right female breast (HCC)   Chronic combined systolic and diastolic CHF (congestive heart failure) (HCC)   Chronic hypotension   Hyponatremia   Hypokalemia   Acute kidney injury superimposed on chronic kidney disease (HCC)   Coronary artery disease with stable angina   Chronic obstructive lung disease (HCC)   Malnutrition of moderate degree   Folate deficiency     Acute C. difficile colitis Improving on p.o. vancomycin.  EOT 12/21   Severe hypothyroidism -TSH 328 > 270, free T4 0.37 -Started on IV levothyroxine --> PO  -Can repeat TSH weekly to make sure its trending down   Folate deficiency -Supplement   Malnutrition of moderate degree -Continue nutrition supplements   COPD -Without acute exacerbation -Inhalers, Singulair, PPI   CAD -Aspirin, Plavix, Ranexa, Imdur   AKI on CKD stage IIIb -Improved, baseline creatinine around 1.4   Chronic hypotension -Midodrine   Chronic combined systolic and  diastolic CHF -Holding home Lasix in setting of AKI and C. difficile colitis   Mood disorder/anxiety -Xanax   Hypokalemia -Replace     DVT prophylaxis: Place and maintain sequential compression device Start: 06/28/23 0946  Code Status: DNR Family Communication: None at bedside Disposition Plan: SNF recommended Status is: Inpatient Remains inpatient appropriate because: SNF placement pending.  Medically cleared   Subjective: No complaints awaiting SNF placement  Examination:  General exam: Appears calm and comfortable, cachectic frail Respiratory system: Clear to auscultation. Respiratory effort normal. Cardiovascular system: S1 & S2 heard, RRR. No JVD, murmurs, rubs, gallops or clicks. No pedal edema. Gastrointestinal system: Abdomen is nondistended, soft and nontender. No organomegaly or masses felt. Normal bowel sounds heard. Central nervous system: Alert and oriented. No focal neurological deficits. Extremities: Symmetric 5 x 5 power. Skin: No rashes, lesions or ulcers Psychiatry: Judgement and insight appear normal. Mood & affect appropriate.                Diet Orders (From admission, onward)     Start     Ordered   06/28/23 0945  DIET DYS 3 Room service appropriate? Yes; Fluid consistency: Thin  Diet effective now       Question Answer Comment  Room service appropriate? Yes   Fluid consistency: Thin      06/28/23 0944            Objective: Vitals:   07/05/23 0011 07/05/23 0728 07/05/23 2347 07/06/23 0752  BP: 99/66 116/69 (!) 153/127 114/63  Pulse: 77 76 80 79  Resp: 18 16 20 16   Temp: 98.5 F (36.9 C) 99 F (37.2  C) 98.6 F (37 C) 98.3 F (36.8 C)  TempSrc: Oral   Oral  SpO2: 100% 99% 99% 99%  Weight:      Height:        Intake/Output Summary (Last 24 hours) at 07/06/2023 1216 Last data filed at 07/05/2023 2000 Gross per 24 hour  Intake 240 ml  Output --  Net 240 ml   Filed Weights   06/30/23 0500  Weight: 60 kg     Scheduled Meds:  (feeding supplement) PROSource Plus  30 mL Oral TID BM   aspirin EC  81 mg Oral Daily   clopidogrel  75 mg Oral Daily   dicyclomine  10 mg Oral TID AC   feeding supplement  1 Container Oral TID BM   ferrous sulfate  325 mg Oral Daily   folic acid  1 mg Oral Daily   isosorbide mononitrate  120 mg Oral BID   levothyroxine  88 mcg Oral Q0600   lidocaine  1 patch Transdermal Q12H   midodrine  5 mg Oral TID WC   mometasone-formoterol  2 puff Inhalation BID   montelukast  10 mg Oral QHS   multivitamin with minerals  1 tablet Oral Daily   mouth rinse  15 mL Mouth Rinse TID   pantoprazole  40 mg Oral BID AC   ranolazine  500 mg Oral BID   traZODone  100 mg Oral QHS   vancomycin  125 mg Oral QID   Continuous Infusions:  Nutritional status Signs/Symptoms: mild fat depletion, moderate fat depletion, mild muscle depletion, moderate muscle depletion Interventions: Boost Breeze, MVI Body mass index is 22.71 kg/m.  Data Reviewed:   CBC: Recent Labs  Lab 06/30/23 0253 07/01/23 0630 07/02/23 0242 07/06/23 0351  WBC 5.0 5.8 5.0 6.3  HGB 7.7* 9.5* 8.7* 8.1*  HCT 21.8* 27.1* 25.5* 23.0*  MCV 89.3 89.1 89.5 90.2  PLT 202 251 239 275   Basic Metabolic Panel: Recent Labs  Lab 07/01/23 0630 07/02/23 0242 07/03/23 0416 07/04/23 0310 07/05/23 0518 07/06/23 0351  NA 128* 128* 128* 130* 129* 130*  K 4.8 4.2 3.7 3.2* 4.2 3.8  CL 103 103 102 102 104 103  CO2 17* 19* 20* 19* 17* 22  GLUCOSE 73 83 90 87 80 87  BUN 14 14 12 11 12 12   CREATININE 1.41* 1.47* 1.33* 1.25* 1.37* 1.19*  CALCIUM 8.4* 8.4* 8.5* 8.5* 8.8* 8.4*  MG 1.8 1.6* 2.2  --  1.8 1.6*  PHOS  --   --   --   --   --  2.5   GFR: Estimated Creatinine Clearance: 36.4 mL/min (A) (by C-G formula based on SCr of 1.19 mg/dL (H)). Liver Function Tests: Recent Labs  Lab 07/02/23 0242  AST 53*  ALT 29  ALKPHOS 87  BILITOT 0.8  PROT 5.4*  ALBUMIN 2.4*   No results for input(s): "LIPASE", "AMYLASE"  in the last 168 hours. No results for input(s): "AMMONIA" in the last 168 hours. Coagulation Profile: No results for input(s): "INR", "PROTIME" in the last 168 hours. Cardiac Enzymes: No results for input(s): "CKTOTAL", "CKMB", "CKMBINDEX", "TROPONINI" in the last 168 hours. BNP (last 3 results) No results for input(s): "PROBNP" in the last 8760 hours. HbA1C: No results for input(s): "HGBA1C" in the last 72 hours. CBG: No results for input(s): "GLUCAP" in the last 168 hours. Lipid Profile: No results for input(s): "CHOL", "HDL", "LDLCALC", "TRIG", "CHOLHDL", "LDLDIRECT" in the last 72 hours. Thyroid Function Tests: Recent Labs  07/05/23 0518  TSH 270.000*   Anemia Panel: No results for input(s): "VITAMINB12", "FOLATE", "FERRITIN", "TIBC", "IRON", "RETICCTPCT" in the last 72 hours. Sepsis Labs: No results for input(s): "PROCALCITON", "LATICACIDVEN" in the last 168 hours.  Recent Results (from the past 240 hours)  Blood Culture (routine x 2)     Status: None   Collection Time: 06/27/23  9:04 PM   Specimen: BLOOD  Result Value Ref Range Status   Specimen Description BLOOD BLOOD LEFT ARM  Final   Special Requests   Final    BOTTLES DRAWN AEROBIC AND ANAEROBIC Blood Culture results may not be optimal due to an inadequate volume of blood received in culture bottles   Culture   Final    NO GROWTH 5 DAYS Performed at [facility], [address] [place], [place] [address]    Report Status 07/02/2023 FINAL  Final  Blood Culture (routine x 2)     Status: None   Collection Time: 06/27/23  9:04 PM   Specimen: BLOOD  Result Value Ref Range Status   Specimen Description BLOOD BLOOD RIGHT ARM  Final   Special Requests   Final    BOTTLES DRAWN AEROBIC AND ANAEROBIC Blood Culture results may not be optimal due to an inadequate volume of blood received in culture bottles   Culture   Final    NO GROWTH 5 DAYS Performed at [facility], [address]  [place], [place] [address]    Report Status 07/02/2023 FINAL  Final  Gastrointestinal Panel by PCR , Stool     Status: None   Collection Time: 06/28/23  4:00 AM   Specimen: Stool  Result Value Ref Range Status   Campylobacter species NOT DETECTED NOT DETECTED Final   Plesimonas shigelloides NOT DETECTED NOT DETECTED Final   Salmonella species NOT DETECTED NOT DETECTED Final   Yersinia enterocolitica NOT DETECTED NOT DETECTED Final   Vibrio species NOT DETECTED NOT DETECTED Final   Vibrio cholerae NOT DETECTED NOT DETECTED Final   Enteroaggregative E coli (EAEC) NOT DETECTED NOT DETECTED Final   Enteropathogenic E coli (EPEC) NOT DETECTED NOT DETECTED Final   Enterotoxigenic E coli (ETEC) NOT DETECTED NOT DETECTED Final   Shiga like toxin producing E coli (STEC) NOT DETECTED NOT DETECTED Final   Shigella/Enteroinvasive E coli (EIEC) NOT DETECTED NOT DETECTED Final   Cryptosporidium NOT DETECTED NOT DETECTED Final   Cyclospora cayetanensis NOT DETECTED NOT DETECTED Final   Entamoeba histolytica NOT DETECTED NOT DETECTED Final   Giardia lamblia NOT DETECTED NOT DETECTED Final   Adenovirus F40/41 NOT DETECTED NOT DETECTED Final   Astrovirus NOT DETECTED NOT DETECTED Final   Norovirus GI/GII NOT DETECTED NOT DETECTED Final   Rotavirus A NOT DETECTED NOT DETECTED Final   Sapovirus (I, II, IV, and V) NOT DETECTED NOT DETECTED Final    Comment: Performed at [facility], [address] [place], [place] [address]  C Difficile Quick Screen w PCR reflex     Status: Abnormal   Collection Time: 06/28/23  4:00 AM   Specimen: Stool  Result Value Ref Range Status   C Diff antigen POSITIVE (A) NEGATIVE Final   C Diff toxin NEGATIVE NEGATIVE Final   C Diff interpretation Results are indeterminate. See PCR results.  Final    Comment: Performed at [facility], [address] [place], [place] [address]  C. Diff by PCR, Reflexed     Status: Abnormal   Collection Time: 06/28/23  4:00  AM  Result Value Ref Range Status   Toxigenic C. Difficile by PCR POSITIVE (A) NEGATIVE Final    Comment: Positive for toxigenic C. difficile with little to no toxin production. Only treat if clinical presentation suggests symptomatic illness. Performed at [facility], [address] [place], [place] [address]          Radiology Studies: No results found.         LOS: 6 days   Time spent= 35 mins    Miguel Rota, MD [facility]  If 7PM-7AM, please contact night-coverage  07/06/2023, 12:16 PM

## 2023-07-06 NOTE — Progress Notes (Signed)
Physical Therapy Treatment Patient Details Name: Abigail Ortega MRN: [id number] DOB: 1950-10-14 Today's Date: 07/06/2023   History of Present Illness Abigail Ortega is a 72 y.o. female with medical history significant for CHF, anxiety, depression, GERD, CKD, hypertension IBS, hypothyroidism and IBS, who presented to the emergency room with acute onset of diarrhea for the last 3 weeks with associated nausea and vomiting as well as left lower quadrant abdominal pain and occasional back pain. Patient with Cdiff colitis.    PT Comments  Pt progressing with PT well. Pt continues to require minA however only 1 person needed today for mobility. Pt able to progress to step pivot compared to stand pivot with transfers today and initiate gait ~4' with RW with chair follow. Pt remains dependent for pericare  due to need for BUE support on RW. Remains with varying periods of posterior leaning as well needing min multimodal cuing for anterior weight shift for STS efforts and with static standing with good carryover. Although pt is making progress she remains a high falls risk with lack of carryover with safe use of AD to prevent falls and severely limited gait capacity with small shuffling steps with little foot clearance bilaterally. Pt in recliner with NT and all needs in reahc post session. Pt's d/c recs remain appropriate.    If plan is discharge home, recommend the following: A lot of help with bathing/dressing/bathroom;Assistance with cooking/housework;Direct supervision/assist for medications management;Help with stairs or ramp for entrance   Can travel by private vehicle     No  Equipment Recommendations  Other (comment)    Recommendations for Other Services       Precautions / Restrictions Precautions Precautions: Fall Required Braces or Orthoses: Splint/Cast Splint/Cast: has splint in room for L wrist-unsure of purpose, was going to outpatient at [facility] for AROM/stretching and splint  made there Restrictions Weight Bearing Restrictions Per Provider Order: No     Mobility  Bed Mobility Overal bed mobility: Needs Assistance Bed Mobility: Supine to Sit     Supine to sit: HOB elevated, Min assist, Used rails       Patient Response: Cooperative  Transfers Overall transfer level: Needs assistance Equipment used: Rolling walker (2 wheels) Transfers: Sit to/from Stand, Bed to chair/wheelchair/BSC Sit to Stand: Min assist Stand pivot transfers: Min assist, +2 safety/equipment              Ambulation/Gait Ambulation/Gait assistance: Contact guard assist Gait Distance (Feet): 4 Feet Assistive device: Rolling walker (2 wheels) Gait Pattern/deviations: Step-to pattern, Leaning posteriorly, Wide base of support, Shuffle           Stairs             Wheelchair Mobility     Tilt Bed Tilt Bed Patient Response: Cooperative  Modified Rankin (Stroke Patients Only)       Balance Overall balance assessment: Needs assistance Sitting-balance support: Feet supported, Single extremity supported Sitting balance-Leahy Scale: Fair       Standing balance-Leahy Scale: Poor Standing balance comment: reliant on RW for support. Min multimodal cuing to correct posterior truncal lean.                            Cognition Arousal: Alert Behavior During Therapy: WFL for tasks assessed/performed Overall Cognitive Status: No family/caregiver present to determine baseline cognitive functioning  Exercises      General Comments        Pertinent Vitals/Pain Pain Assessment Pain Assessment: Faces Faces Pain Scale: No hurt    Home Living                          Prior Function            PT Goals (current goals can now be found in the care plan section) Acute Rehab PT Goals Patient Stated Goal: to improve, decrease pain PT Goal Formulation: With patient Time For  Goal Achievement: 07/12/23 Potential to Achieve Goals: Fair Progress towards PT goals: Progressing toward goals    Frequency    Min 1X/week      PT Plan      Co-evaluation              AM-PAC PT "6 Clicks" Mobility   Outcome Measure  Help needed turning from your back to your side while in a flat bed without using bedrails?: A Little Help needed moving from lying on your back to sitting on the side of a flat bed without using bedrails?: A Little Help needed moving to and from a bed to a chair (including a wheelchair)?: A Lot Help needed standing up from a chair using your arms (e.g., wheelchair or bedside chair)?: A Little Help needed to walk in hospital room?: A Lot Help needed climbing 3-5 steps with a railing? : Total 6 Click Score: 14    End of Session Equipment Utilized During Treatment: Gait belt Activity Tolerance: Patient tolerated treatment well Patient left: in chair;with chair alarm set;with call bell/phone within reach Nurse Communication: Mobility status PT Visit Diagnosis: Other abnormalities of gait and mobility (R26.89);Muscle weakness (generalized) (M62.81);History of falling (Z91.81);Difficulty in walking, not elsewhere classified (R26.2);Unsteadiness on feet (R26.81);Pain Pain - Right/Left: Left Pain - part of body: Leg     Time: 1449-1515 PT Time Calculation (min) (ACUTE ONLY): 26 min  Charges:    $Therapeutic Activity: 23-37 mins PT General Charges $$ ACUTE PT VISIT: 1 Visit                    Delphia Grates. Fairly IV, PT, DPT Physical Therapist- [place]  [facility]  07/06/2023, 4:19 PM

## 2023-07-06 NOTE — TOC Progression Note (Incomplete)
Transition of Care [facility]) - Progression Note    Patient Details  Name: Abigail Ortega MRN: [id number] Date of Birth: 10-Oct-1950  Transition of [facility] CM/SW Contact  Garret Reddish, RN Phone Number: 07/06/2023, 4:32 PM  Clinical Narrative:    SNF authorization approved.  [facility] can accept on Monday.  TOC will follow for discharge planning.         Expected Discharge Plan and Services                                               Social Determinants of Health (SDOH) Interventions SDOH Screenings   Food Insecurity: No Food Insecurity (06/28/2023)  Housing: Unknown (06/28/2023)  Transportation Needs: No Transportation Needs (06/28/2023)  Utilities: Not At Risk (06/28/2023)  Tobacco Use: Medium Risk (06/27/2023)    Readmission Risk Interventions     No data to display

## 2023-07-06 NOTE — Consult Note (Addendum)
[facility] Liaison Note  07/06/2023  Abigail Ortega 11/18/1950 [id number]  Location: RN Hospital Liaison screened the patient remotely at [facility].  Insurance: [employer]   Abigail Ortega is a 72 y.o. female who is a Primary Care Patient of Margaretann Loveless, MD-[facility].  The patient was screened for  readmission hospitalization with noted extreme risk score for unplanned readmission risk with 1 IP/3 ED in 6 months.  The patient was assessed for potential Care Management service needs for post hospital transition for care coordination. Review of patient's electronic medical record reveals patient was admitted for  C-Difficile Colitis. Currently pending SNF level of care. Facility will continue to address pt's ongoing needs.   Plan: [facility] Liaison will continue to follow progress and disposition to asess for post hospital community care coordination/management needs.  Referral request for community care coordination: pending disposition.   [facility] Care Management/Population Health does not replace or interfere with any arrangements made by the Inpatient Transition of Care team.   For questions contact:   Elliot Cousin, RN, [facility] Liaison [place]   [facility], Population Health Office Hours MTWF  8:00 am-6:00 pm Direct Dial: [phone] mobile [phone] [Office toll free line] Office Hours are M-F 8:30 - 5 pm [email or web address]

## 2023-07-06 NOTE — Plan of Care (Signed)

## 2023-07-07 DIAGNOSIS — K529 Noninfective gastroenteritis and colitis, unspecified: Secondary | ICD-10-CM | POA: Diagnosis not present

## 2023-07-07 LAB — MAGNESIUM: Magnesium: 1.6 mg/dL — ABNORMAL LOW (ref 1.7–2.4)

## 2023-07-07 LAB — BASIC METABOLIC PANEL
Anion gap: 10 (ref 5–15)
BUN: 12 mg/dL (ref 8–23)
CO2: 19 mmol/L — ABNORMAL LOW (ref 22–32)
Calcium: 8.7 mg/dL — ABNORMAL LOW (ref 8.9–10.3)
Chloride: 102 mmol/L (ref 98–111)
Creatinine, Ser: 1.19 mg/dL — ABNORMAL HIGH (ref 0.44–1.00)
GFR, Estimated: 48 mL/min — ABNORMAL LOW (ref >60)
Glucose, Bld: 87 mg/dL (ref 70–99)
Potassium: 3.4 mmol/L — ABNORMAL LOW (ref 3.5–5.1)
Sodium: 131 mmol/L — ABNORMAL LOW (ref 135–145)

## 2023-07-07 LAB — CBC
HCT: 24 % — ABNORMAL LOW (ref 36.0–46.0)
Hemoglobin: 8.2 g/dL — ABNORMAL LOW (ref 12.0–15.0)
MCH: 31.5 pg (ref 26.0–34.0)
MCHC: 34.2 g/dL (ref 30.0–36.0)
MCV: 92.3 fL (ref 80.0–100.0)
Platelets: 292 10*3/uL (ref 150–400)
RBC: 2.6 MIL/uL — ABNORMAL LOW (ref 3.87–5.11)
RDW: 16.5 % — ABNORMAL HIGH (ref 11.5–15.5)
WBC: 6.3 10*3/uL (ref 4.0–10.5)
nRBC: 0 % (ref 0.0–0.2)

## 2023-07-07 MED ORDER — MAGNESIUM SULFATE 2 GM/50ML IV SOLN
2.0000 g | Freq: Once | INTRAVENOUS | Status: AC
Start: 2023-07-07 — End: 2023-07-07
  Administered 2023-07-07: 2 g via INTRAVENOUS
  Filled 2023-07-07: qty 50

## 2023-07-07 MED ORDER — LOPERAMIDE HCL 2 MG PO CAPS
4.0000 mg | ORAL_CAPSULE | Freq: Once | ORAL | Status: AC
  Administered 2023-07-07: 4 mg via ORAL
  Filled 2023-07-07: qty 2

## 2023-07-07 MED ORDER — POTASSIUM CHLORIDE CRYS ER 20 MEQ PO TBCR
40.0000 meq | EXTENDED_RELEASE_TABLET | Freq: Two times a day (BID) | ORAL | Status: AC
  Administered 2023-07-07 (×2): 40 meq via ORAL
  Filled 2023-07-07 (×2): qty 2

## 2023-07-07 MED ORDER — SACCHAROMYCES BOULARDII 250 MG PO CAPS
250.0000 mg | ORAL_CAPSULE | Freq: Two times a day (BID) | ORAL | Status: DC
Start: 2023-07-07 — End: 2023-07-09
  Administered 2023-07-07 – 2023-07-09 (×5): 250 mg via ORAL
  Filled 2023-07-07 (×6): qty 1

## 2023-07-07 NOTE — Consult Note (Signed)
PHARMACY CONSULT NOTE - FOLLOW UP  Pharmacy Consult for Electrolyte Monitoring and Replacement   Recent Labs: Potassium (mmol/L)  Date Value  07/07/2023 3.4 (L)  03/20/2014 3.4 (L)   Magnesium (mg/dL)  Date Value  16/04/9603 1.6 (L)  02/23/2014 1.7 (L)   Calcium (mg/dL)  Date Value  54/03/8118 8.7 (L)   Calcium, Total (mg/dL)  Date Value  14/78/2956 9.6   Albumin (g/dL)  Date Value  21/30/8657 2.4 (L)  11/24/2022 4.3  03/20/2014 3.4   Phosphorus (mg/dL)  Date Value  84/69/6295 2.5   Sodium (mmol/L)  Date Value  07/07/2023 131 (L)  11/24/2022 137  03/20/2014 135 (L)     Assessment: PJ is a 72 yo female who presented with hypotension post severe diarrhea and cramps. Found to be C. Diff positive. They were admitted for C. Diff colitis and AKI. Pharmacy was consulted to monitor and replace their electrolytes.   Goal of Therapy: WNL K = 3.4 Mg = 1.6 Phos = 2.5 (12/20)  Plan:  Ordered 40 mEq Kcl x 2 PO Order Mg sulfate 2g IV x1  Effie Shy, PharmD Pharmacy Resident  07/07/2023 8:46 AM

## 2023-07-07 NOTE — Plan of Care (Signed)
  Problem: Education: Goal: Knowledge of General Education information will improve Description: Including pain rating scale, medication(s)/side effects and non-pharmacologic comfort measures Outcome: Progressing   Problem: Health Behavior/Discharge Planning: Goal: Ability to manage health-related needs will improve Outcome: Not Progressing   Problem: Clinical Measurements: Goal: Ability to maintain clinical measurements within normal limits will improve Outcome: Not Progressing

## 2023-07-07 NOTE — Progress Notes (Signed)
PROGRESS NOTE    Abigail Ortega  UUV:[id number] DOB: 1950-10-29 DOA: 06/27/2023 PCP: Margaretann Loveless, MD    Brief Narrative:  72 year old female with past medical history significant for chronic combined systolic and diastolic CHF, hypertension, COPD, hypothyroidism, CAD, hyperlipidemia, mood disorder who presented with severe diarrhea and cramps for over a week. In the emergency department, patient was found to be hypotensive, creatinine up to 1.9, C. difficile was positive. Patient was admitted for C. difficile colitis, AKI. She was started on oral vancomycin  Was also noted to have severe hypothyroidism with elevated TSH and low free T4.  Initially received IV Synthroid and then transition to p.o. TSH level is slowly improving.  PT/OT recommended SNF.  TOC working on placement.  Otherwise medically cleared for SNF at this point.  Assessment & Plan:  Principal Problem:   Acute C. difficile colitis Active Problems:   Severe hypothyroidism   Breast cancer of lower-inner quadrant of right female breast (HCC)   Chronic combined systolic and diastolic CHF (congestive heart failure) (HCC)   Chronic hypotension   Hyponatremia   Hypokalemia   Acute kidney injury superimposed on chronic kidney disease (HCC)   Coronary artery disease with stable angina   Chronic obstructive lung disease (HCC)   Malnutrition of moderate degree   Folate deficiency     Acute C. difficile colitis Improving on p.o. vancomycin.  EOT 12/21   Severe hypothyroidism -TSH 328 > 270, free T4 0.37 Initially on IV Synthroid now on p.o.  Repeat TSH weekly until an adequate range   Folate deficiency -Supplement   Malnutrition of moderate degree -Continue nutrition supplements   COPD -Without acute exacerbation -Inhalers, Singulair, PPI   CAD -Aspirin, Plavix, Ranexa, Imdur   AKI on CKD stage IIIb -Improved, baseline creatinine around 1.4   Chronic hypotension -Midodrine   Chronic combined systolic  and diastolic CHF -Holding home Lasix in setting of AKI and C. difficile colitis   Mood disorder/anxiety -Xanax   Hypokalemia/hypomagnesemia -Replace     DVT prophylaxis: Place and maintain sequential compression device Start: 06/28/23 0946  Code Status: DNR Family Communication: None at bedside Disposition Plan: SNF recommended Status is: Inpatient Remains inpatient appropriate because: SNF placement pending.  Medically cleared   Subjective: No complaints awaiting SNF placement  Examination:  General exam: Appears calm and comfortable, cachectic frail Respiratory system: Clear to auscultation. Respiratory effort normal. Cardiovascular system: S1 & S2 heard, RRR. No JVD, murmurs, rubs, gallops or clicks. No pedal edema. Gastrointestinal system: Abdomen is nondistended, soft and nontender. No organomegaly or masses felt. Normal bowel sounds heard. Central nervous system: Alert and oriented. No focal neurological deficits. Extremities: Symmetric 5 x 5 power. Skin: No rashes, lesions or ulcers Psychiatry: Judgement and insight appear normal. Mood & affect appropriate.                Diet Orders (From admission, onward)     Start     Ordered   06/28/23 0945  DIET DYS 3 Room service appropriate? Yes; Fluid consistency: Thin  Diet effective now       Question Answer Comment  Room service appropriate? Yes   Fluid consistency: Thin      06/28/23 0944            Objective: Vitals:   07/06/23 1307 07/06/23 1517 07/06/23 2230 07/07/23 0836  BP: 109/74 134/76 97/64 109/73  Pulse:  96 82 80  Resp:  15 18 14   Temp:  98.3 F (36.8 C)  97.9 F (36.6 C) 97.8 F (36.6 C)  TempSrc:      SpO2:  100% 100% 98%  Weight:      Height:        Intake/Output Summary (Last 24 hours) at 07/07/2023 1134 Last data filed at 07/06/2023 1700 Gross per 24 hour  Intake 660 ml  Output --  Net 660 ml   Filed Weights   06/30/23 0500  Weight: 60 kg    Scheduled Meds:   (feeding supplement) PROSource Plus  30 mL Oral TID BM   aspirin EC  81 mg Oral Daily   clopidogrel  75 mg Oral Daily   dicyclomine  10 mg Oral TID AC   feeding supplement  1 Container Oral TID BM   ferrous sulfate  325 mg Oral Daily   folic acid  1 mg Oral Daily   isosorbide mononitrate  120 mg Oral BID   levothyroxine  88 mcg Oral Q0600   lidocaine  1 patch Transdermal Q12H   midodrine  5 mg Oral TID WC   mometasone-formoterol  2 puff Inhalation BID   montelukast  10 mg Oral QHS   multivitamin with minerals  1 tablet Oral Daily   mouth rinse  15 mL Mouth Rinse TID   pantoprazole  40 mg Oral BID AC   potassium chloride  40 mEq Oral BID   ranolazine  500 mg Oral BID   traZODone  100 mg Oral QHS   vancomycin  125 mg Oral QID   Continuous Infusions:  magnesium sulfate bolus IVPB      Nutritional status Signs/Symptoms: mild fat depletion, moderate fat depletion, mild muscle depletion, moderate muscle depletion Interventions: Boost Breeze, MVI Body mass index is 22.71 kg/m.  Data Reviewed:   CBC: Recent Labs  Lab 07/01/23 0630 07/02/23 0242 07/06/23 0351 07/07/23 0605  WBC 5.8 5.0 6.3 6.3  HGB 9.5* 8.7* 8.1* 8.2*  HCT 27.1* 25.5* 23.0* 24.0*  MCV 89.1 89.5 90.2 92.3  PLT 251 239 275 292   Basic Metabolic Panel: Recent Labs  Lab 07/02/23 0242 07/03/23 0416 07/04/23 0310 07/05/23 0518 07/06/23 0351 07/07/23 0605  NA 128* 128* 130* 129* 130* 131*  K 4.2 3.7 3.2* 4.2 3.8 3.4*  CL 103 102 102 104 103 102  CO2 19* 20* 19* 17* 22 19*  GLUCOSE 83 90 87 80 87 87  BUN 14 12 11 12 12 12   CREATININE 1.47* 1.33* 1.25* 1.37* 1.19* 1.19*  CALCIUM 8.4* 8.5* 8.5* 8.8* 8.4* 8.7*  MG 1.6* 2.2  --  1.8 1.6* 1.6*  PHOS  --   --   --   --  2.5  --    GFR: Estimated Creatinine Clearance: 36.4 mL/min (A) (by C-G formula based on SCr of 1.19 mg/dL (H)). Liver Function Tests: Recent Labs  Lab 07/02/23 0242  AST 53*  ALT 29  ALKPHOS 87  BILITOT 0.8  PROT 5.4*  ALBUMIN  2.4*   No results for input(s): "LIPASE", "AMYLASE" in the last 168 hours. No results for input(s): "AMMONIA" in the last 168 hours. Coagulation Profile: No results for input(s): "INR", "PROTIME" in the last 168 hours. Cardiac Enzymes: No results for input(s): "CKTOTAL", "CKMB", "CKMBINDEX", "TROPONINI" in the last 168 hours. BNP (last 3 results) No results for input(s): "PROBNP" in the last 8760 hours. HbA1C: No results for input(s): "HGBA1C" in the last 72 hours. CBG: No results for input(s): "GLUCAP" in the last 168 hours. Lipid Profile: No results for input(s): "CHOL", "  HDL", "LDLCALC", "TRIG", "CHOLHDL", "LDLDIRECT" in the last 72 hours. Thyroid Function Tests: Recent Labs    07/05/23 0518  TSH 270.000*   Anemia Panel: No results for input(s): "VITAMINB12", "FOLATE", "FERRITIN", "TIBC", "IRON", "RETICCTPCT" in the last 72 hours. Sepsis Labs: No results for input(s): "PROCALCITON", "LATICACIDVEN" in the last 168 hours.  Recent Results (from the past 240 hours)  Blood Culture (routine x 2)     Status: None   Collection Time: 06/27/23  9:04 PM   Specimen: BLOOD  Result Value Ref Range Status   Specimen Description BLOOD BLOOD LEFT ARM  Final   Special Requests   Final    BOTTLES DRAWN AEROBIC AND ANAEROBIC Blood Culture results may not be optimal due to an inadequate volume of blood received in culture bottles   Culture   Final    NO GROWTH 5 DAYS Performed at [facility], [address] [place], [place] [address]    Report Status 07/02/2023 FINAL  Final  Blood Culture (routine x 2)     Status: None   Collection Time: 06/27/23  9:04 PM   Specimen: BLOOD  Result Value Ref Range Status   Specimen Description BLOOD BLOOD RIGHT ARM  Final   Special Requests   Final    BOTTLES DRAWN AEROBIC AND ANAEROBIC Blood Culture results may not be optimal due to an inadequate volume of blood received in culture bottles   Culture   Final    NO GROWTH 5 DAYS Performed at  [facility], [address] [place], [place] [address]    Report Status 07/02/2023 FINAL  Final  Gastrointestinal Panel by PCR , Stool     Status: None   Collection Time: 06/28/23  4:00 AM   Specimen: Stool  Result Value Ref Range Status   Campylobacter species NOT DETECTED NOT DETECTED Final   Plesimonas shigelloides NOT DETECTED NOT DETECTED Final   Salmonella species NOT DETECTED NOT DETECTED Final   Yersinia enterocolitica NOT DETECTED NOT DETECTED Final   Vibrio species NOT DETECTED NOT DETECTED Final   Vibrio cholerae NOT DETECTED NOT DETECTED Final   Enteroaggregative E coli (EAEC) NOT DETECTED NOT DETECTED Final   Enteropathogenic E coli (EPEC) NOT DETECTED NOT DETECTED Final   Enterotoxigenic E coli (ETEC) NOT DETECTED NOT DETECTED Final   Shiga like toxin producing E coli (STEC) NOT DETECTED NOT DETECTED Final   Shigella/Enteroinvasive E coli (EIEC) NOT DETECTED NOT DETECTED Final   Cryptosporidium NOT DETECTED NOT DETECTED Final   Cyclospora cayetanensis NOT DETECTED NOT DETECTED Final   Entamoeba histolytica NOT DETECTED NOT DETECTED Final   Giardia lamblia NOT DETECTED NOT DETECTED Final   Adenovirus F40/41 NOT DETECTED NOT DETECTED Final   Astrovirus NOT DETECTED NOT DETECTED Final   Norovirus GI/GII NOT DETECTED NOT DETECTED Final   Rotavirus A NOT DETECTED NOT DETECTED Final   Sapovirus (I, II, IV, and V) NOT DETECTED NOT DETECTED Final    Comment: Performed at [facility], [address] [place], [place] [address]  C Difficile Quick Screen w PCR reflex     Status: Abnormal   Collection Time: 06/28/23  4:00 AM   Specimen: Stool  Result Value Ref Range Status   C Diff antigen POSITIVE (A) NEGATIVE Final   C Diff toxin NEGATIVE NEGATIVE Final   C Diff interpretation Results are indeterminate. See PCR results.  Final    Comment: Performed at [facility], [address] [place], [place] [address]  C.  Diff by PCR, Reflexed     Status:  Abnormal   Collection Time: 06/28/23  4:00 AM  Result Value Ref Range Status   Toxigenic C. Difficile by PCR POSITIVE (A) NEGATIVE Final    Comment: Positive for toxigenic C. difficile with little to no toxin production. Only treat if clinical presentation suggests symptomatic illness. Performed at [facility], [address] [place], [place] [address]          Radiology Studies: No results found.         LOS: 7 days   Time spent= 35 mins    Miguel Rota, MD [facility]  If 7PM-7AM, please contact night-coverage  07/07/2023, 11:34 AM

## 2023-07-08 DIAGNOSIS — K529 Noninfective gastroenteritis and colitis, unspecified: Secondary | ICD-10-CM | POA: Diagnosis not present

## 2023-07-08 LAB — BASIC METABOLIC PANEL
Anion gap: 7 (ref 5–15)
BUN: 11 mg/dL (ref 8–23)
CO2: 20 mmol/L — ABNORMAL LOW (ref 22–32)
Calcium: 8.3 mg/dL — ABNORMAL LOW (ref 8.9–10.3)
Chloride: 103 mmol/L (ref 98–111)
Creatinine, Ser: 1.05 mg/dL — ABNORMAL HIGH (ref 0.44–1.00)
GFR, Estimated: 56 mL/min — ABNORMAL LOW (ref >60)
Glucose, Bld: 87 mg/dL (ref 70–99)
Potassium: 4.2 mmol/L (ref 3.5–5.1)
Sodium: 130 mmol/L — ABNORMAL LOW (ref 135–145)

## 2023-07-08 LAB — CBC
HCT: 22.4 % — ABNORMAL LOW (ref 36.0–46.0)
Hemoglobin: 7.8 g/dL — ABNORMAL LOW (ref 12.0–15.0)
MCH: 31.7 pg (ref 26.0–34.0)
MCHC: 34.8 g/dL (ref 30.0–36.0)
MCV: 91.1 fL (ref 80.0–100.0)
Platelets: 296 10*3/uL (ref 150–400)
RBC: 2.46 MIL/uL — ABNORMAL LOW (ref 3.87–5.11)
RDW: 16.4 % — ABNORMAL HIGH (ref 11.5–15.5)
WBC: 5.7 10*3/uL (ref 4.0–10.5)
nRBC: 0 % (ref 0.0–0.2)

## 2023-07-08 LAB — MAGNESIUM: Magnesium: 2 mg/dL (ref 1.7–2.4)

## 2023-07-08 NOTE — Consult Note (Signed)
PHARMACY CONSULT NOTE - FOLLOW UP  Pharmacy Consult for Electrolyte Monitoring and Replacement   Recent Labs: Potassium (mmol/L)  Date Value  07/08/2023 4.2  03/20/2014 3.4 (L)   Magnesium (mg/dL)  Date Value  47/82/9562 2.0  02/23/2014 1.7 (L)   Calcium (mg/dL)  Date Value  13/02/6577 8.3 (L)   Calcium, Total (mg/dL)  Date Value  46/96/2952 9.6   Albumin (g/dL)  Date Value  84/13/2440 2.4 (L)  11/24/2022 4.3  03/20/2014 3.4   Phosphorus (mg/dL)  Date Value  05/13/2535 2.5   Sodium (mmol/L)  Date Value  07/08/2023 130 (L)  11/24/2022 137  03/20/2014 135 (L)     Assessment: Abigail Ortega is a 72 yo female who presented with hypotension post severe diarrhea and cramps. Found to be C. Diff positive. They were admitted for C. Diff colitis and AKI. Pharmacy was consulted to monitor and replace their electrolytes.   Goal of Therapy: WNL  Plan:  No replacement needed today F/u with AM labs.   Ronnald Ramp, PharmD 07/08/2023 7:31 AM

## 2023-07-08 NOTE — Plan of Care (Signed)

## 2023-07-08 NOTE — Plan of Care (Signed)
  Problem: Clinical Measurements: Goal: Respiratory complications will improve Outcome: Progressing   Problem: Coping: Goal: Level of anxiety will decrease Outcome: Progressing   Problem: Pain Management: Goal: General experience of comfort will improve Outcome: Progressing   Problem: Safety: Goal: Ability to remain free from injury will improve Outcome: Progressing   Problem: Skin Integrity: Goal: Risk for impaired skin integrity will decrease Outcome: Progressing

## 2023-07-08 NOTE — Progress Notes (Signed)
PROGRESS NOTE    Abigail Ortega  OZH:[id number] DOB: 1950/08/29 DOA: 06/27/2023 PCP: Margaretann Loveless, MD    Brief Narrative:  72 year old female with past medical history significant for chronic combined systolic and diastolic CHF, hypertension, COPD, hypothyroidism, CAD, hyperlipidemia, mood disorder who presented with severe diarrhea and cramps for over a week. In the emergency department, patient was found to be hypotensive, creatinine up to 1.9, C. difficile was positive. Patient was admitted for C. difficile colitis, AKI. She was started on oral vancomycin  Was also noted to have severe hypothyroidism with elevated TSH and low free T4.  Initially received IV Synthroid and then transition to p.o. TSH level is slowly improving.  PT/OT recommended SNF.  TOC working on placement.  Otherwise medically cleared for SNF at this point.  Assessment & Plan:  Principal Problem:   Acute C. difficile colitis Active Problems:   Severe hypothyroidism   Breast cancer of lower-inner quadrant of right female breast (HCC)   Chronic combined systolic and diastolic CHF (congestive heart failure) (HCC)   Chronic hypotension   Hyponatremia   Hypokalemia   Acute kidney injury superimposed on chronic kidney disease (HCC)   Coronary artery disease with stable angina   Chronic obstructive lung disease (HCC)   Malnutrition of moderate degree   Folate deficiency     Acute C. difficile colitis P.o. vancomycin completed 12/21.   Severe hypothyroidism -TSH 328 > 270, free T4 0.37 Initially on IV Synthroid now on p.o.  Repeat TSH weekly until an adequate range   Folate deficiency -Supplement   Malnutrition of moderate degree -Continue nutrition supplements   COPD -Without acute exacerbation -Inhalers, Singulair, PPI   CAD -Aspirin, Plavix, Ranexa, Imdur  Acute on chronic anemia - Status post 1 unit PRBC transfusion 12/13.   AKI on CKD stage IIIb -Initial creatinine 1.79, resolved,  baseline creatinine around 1.1   Chronic hypotension -Midodrine   Chronic combined systolic and diastolic CHF -Holding home Lasix in setting of AKI and C. difficile colitis   Mood disorder/anxiety -Xanax   Hypokalemia/hypomagnesemia -Replace   Labs every 3 days now   DVT prophylaxis: Place and maintain sequential compression device Start: 06/28/23 0946  Code Status: DNR Family Communication: None at bedside Disposition Plan: SNF recommended Status is: Inpatient Remains inpatient appropriate because: SNF placement pending.  Medically cleared   Subjective: No complaints overall feeling better Sitting up in recliner  Examination:  General exam: Appears calm and comfortable, cachectic frail Respiratory system: Clear to auscultation. Respiratory effort normal. Cardiovascular system: S1 & S2 heard, RRR. No JVD, murmurs, rubs, gallops or clicks. No pedal edema. Gastrointestinal system: Abdomen is nondistended, soft and nontender. No organomegaly or masses felt. Normal bowel sounds heard. Central nervous system: Alert and oriented. No focal neurological deficits. Extremities: Symmetric 5 x 5 power. Skin: No rashes, lesions or ulcers Psychiatry: Judgement and insight appear normal. Mood & affect appropriate.                Diet Orders (From admission, onward)     Start     Ordered   06/28/23 0945  DIET DYS 3 Room service appropriate? Yes; Fluid consistency: Thin  Diet effective now       Question Answer Comment  Room service appropriate? Yes   Fluid consistency: Thin      06/28/23 0944            Objective: Vitals:   07/07/23 0836 07/07/23 1542 07/08/23 0905 07/08/23 1515  BP: 109/73  106/64 106/63 110/67  Pulse: 80 79 79 75  Resp: 14 16 16 16   Temp: 97.8 F (36.6 C) 98.5 F (36.9 C) 97.9 F (36.6 C) 97.8 F (36.6 C)  TempSrc:   Oral   SpO2: 98% 99% 100% 100%  Weight:      Height:        Intake/Output Summary (Last 24 hours) at 07/08/2023  1612 Last data filed at 07/07/2023 2230 Gross per 24 hour  Intake 640 ml  Output --  Net 640 ml   Filed Weights   06/30/23 0500  Weight: 60 kg    Scheduled Meds:  (feeding supplement) PROSource Plus  30 mL Oral TID BM   aspirin EC  81 mg Oral Daily   clopidogrel  75 mg Oral Daily   dicyclomine  10 mg Oral TID AC   feeding supplement  1 Container Oral TID BM   ferrous sulfate  325 mg Oral Daily   folic acid  1 mg Oral Daily   isosorbide mononitrate  120 mg Oral BID   levothyroxine  88 mcg Oral Q0600   lidocaine  1 patch Transdermal Q12H   midodrine  5 mg Oral TID WC   mometasone-formoterol  2 puff Inhalation BID   montelukast  10 mg Oral QHS   multivitamin with minerals  1 tablet Oral Daily   mouth rinse  15 mL Mouth Rinse TID   pantoprazole  40 mg Oral BID AC   ranolazine  500 mg Oral BID   saccharomyces boulardii  250 mg Oral BID   traZODone  100 mg Oral QHS   Continuous Infusions:  Nutritional status Signs/Symptoms: mild fat depletion, moderate fat depletion, mild muscle depletion, moderate muscle depletion Interventions: Boost Breeze, MVI Body mass index is 22.71 kg/m.  Data Reviewed:   CBC: Recent Labs  Lab 07/02/23 0242 07/06/23 0351 07/07/23 0605 07/08/23 0401  WBC 5.0 6.3 6.3 5.7  HGB 8.7* 8.1* 8.2* 7.8*  HCT 25.5* 23.0* 24.0* 22.4*  MCV 89.5 90.2 92.3 91.1  PLT 239 275 292 296   Basic Metabolic Panel: Recent Labs  Lab 07/03/23 0416 07/04/23 0310 07/05/23 0518 07/06/23 0351 07/07/23 0605 07/08/23 0401  NA 128* 130* 129* 130* 131* 130*  K 3.7 3.2* 4.2 3.8 3.4* 4.2  CL 102 102 104 103 102 103  CO2 20* 19* 17* 22 19* 20*  GLUCOSE 90 87 80 87 87 87  BUN 12 11 12 12 12 11   CREATININE 1.33* 1.25* 1.37* 1.19* 1.19* 1.05*  CALCIUM 8.5* 8.5* 8.8* 8.4* 8.7* 8.3*  MG 2.2  --  1.8 1.6* 1.6* 2.0  PHOS  --   --   --  2.5  --   --    GFR: Estimated Creatinine Clearance: 41.2 mL/min (A) (by C-G formula based on SCr of 1.05 mg/dL (H)). Liver  Function Tests: Recent Labs  Lab 07/02/23 0242  AST 53*  ALT 29  ALKPHOS 87  BILITOT 0.8  PROT 5.4*  ALBUMIN 2.4*   No results for input(s): "LIPASE", "AMYLASE" in the last 168 hours. No results for input(s): "AMMONIA" in the last 168 hours. Coagulation Profile: No results for input(s): "INR", "PROTIME" in the last 168 hours. Cardiac Enzymes: No results for input(s): "CKTOTAL", "CKMB", "CKMBINDEX", "TROPONINI" in the last 168 hours. BNP (last 3 results) No results for input(s): "PROBNP" in the last 8760 hours. HbA1C: No results for input(s): "HGBA1C" in the last 72 hours. CBG: No results for input(s): "GLUCAP" in the last 168  hours. Lipid Profile: No results for input(s): "CHOL", "HDL", "LDLCALC", "TRIG", "CHOLHDL", "LDLDIRECT" in the last 72 hours. Thyroid Function Tests: No results for input(s): "TSH", "T4TOTAL", "FREET4", "T3FREE", "THYROIDAB" in the last 72 hours. Anemia Panel: No results for input(s): "VITAMINB12", "FOLATE", "FERRITIN", "TIBC", "IRON", "RETICCTPCT" in the last 72 hours. Sepsis Labs: No results for input(s): "PROCALCITON", "LATICACIDVEN" in the last 168 hours.  No results found for this or any previous visit (from the past 240 hours).       Radiology Studies: No results found.         LOS: 8 days   Time spent= 35 mins    Miguel Rota, MD [facility]  If 7PM-7AM, please contact night-coverage  07/08/2023, 4:12 PM

## 2023-07-09 DIAGNOSIS — I9589 Other hypotension: Secondary | ICD-10-CM | POA: Diagnosis not present

## 2023-07-09 DIAGNOSIS — N179 Acute kidney failure, unspecified: Secondary | ICD-10-CM | POA: Diagnosis not present

## 2023-07-09 DIAGNOSIS — I509 Heart failure, unspecified: Secondary | ICD-10-CM | POA: Diagnosis not present

## 2023-07-09 DIAGNOSIS — E876 Hypokalemia: Secondary | ICD-10-CM | POA: Diagnosis not present

## 2023-07-09 DIAGNOSIS — K529 Noninfective gastroenteritis and colitis, unspecified: Secondary | ICD-10-CM | POA: Diagnosis not present

## 2023-07-09 DIAGNOSIS — E538 Deficiency of other specified B group vitamins: Secondary | ICD-10-CM | POA: Diagnosis not present

## 2023-07-09 DIAGNOSIS — N182 Chronic kidney disease, stage 2 (mild): Secondary | ICD-10-CM | POA: Diagnosis not present

## 2023-07-09 DIAGNOSIS — E871 Hypo-osmolality and hyponatremia: Secondary | ICD-10-CM | POA: Diagnosis not present

## 2023-07-09 DIAGNOSIS — C50311 Malignant neoplasm of lower-inner quadrant of right female breast: Secondary | ICD-10-CM | POA: Diagnosis not present

## 2023-07-09 DIAGNOSIS — A0472 Enterocolitis due to Clostridium difficile, not specified as recurrent: Secondary | ICD-10-CM | POA: Diagnosis not present

## 2023-07-09 DIAGNOSIS — E039 Hypothyroidism, unspecified: Secondary | ICD-10-CM | POA: Diagnosis not present

## 2023-07-09 DIAGNOSIS — Z743 Need for continuous supervision: Secondary | ICD-10-CM | POA: Diagnosis not present

## 2023-07-09 DIAGNOSIS — J449 Chronic obstructive pulmonary disease, unspecified: Secondary | ICD-10-CM | POA: Diagnosis not present

## 2023-07-09 DIAGNOSIS — R29898 Other symptoms and signs involving the musculoskeletal system: Secondary | ICD-10-CM | POA: Diagnosis not present

## 2023-07-09 DIAGNOSIS — E440 Moderate protein-calorie malnutrition: Secondary | ICD-10-CM | POA: Diagnosis not present

## 2023-07-09 DIAGNOSIS — K580 Irritable bowel syndrome with diarrhea: Secondary | ICD-10-CM | POA: Diagnosis not present

## 2023-07-09 DIAGNOSIS — I2510 Atherosclerotic heart disease of native coronary artery without angina pectoris: Secondary | ICD-10-CM | POA: Diagnosis not present

## 2023-07-09 NOTE — Consult Note (Signed)
PHARMACY CONSULT NOTE - FOLLOW UP  Pharmacy Consult for Electrolyte Monitoring and Replacement   Recent Labs: Potassium (mmol/L)  Date Value  07/08/2023 4.2  03/20/2014 3.4 (L)   Magnesium (mg/dL)  Date Value  44/09/4740 2.0  02/23/2014 1.7 (L)   Calcium (mg/dL)  Date Value  59/56/3875 8.3 (L)   Calcium, Total (mg/dL)  Date Value  64/33/2951 9.6   Albumin (g/dL)  Date Value  88/41/6606 2.4 (L)  11/24/2022 4.3  03/20/2014 3.4   Phosphorus (mg/dL)  Date Value  30/16/0109 2.5   Sodium (mmol/L)  Date Value  07/08/2023 130 (L)  11/24/2022 137  03/20/2014 135 (L)     Assessment: Abigail Ortega is a 72 yo female who presented with hypotension post severe diarrhea and cramps. Found to be C. Diff positive. They were admitted for C. Diff colitis and AKI. Pharmacy was consulted to monitor and replace their electrolytes.   Goal of Therapy: WNL  Plan:  No replacement needed today F/u with AM labs.   Barrie Folk, PharmD 07/09/2023 11:30 AM

## 2023-07-09 NOTE — Discharge Summary (Signed)
Physician Discharge Summary   Patient: Abigail Ortega MRN: [id number] DOB: 02-07-1951  Admit date:     06/27/2023  Discharge date: 07/09/23  Discharge Physician: Lurene Shadow   PCP: Margaretann Loveless, MD   Recommendations at discharge:   Follow-up with physician at the nursing home within 3 days of discharge  Discharge Diagnoses: Principal Problem:   Acute C. difficile colitis Active Problems:   Severe hypothyroidism   Breast cancer of lower-inner quadrant of right female breast (HCC)   Chronic combined systolic and diastolic CHF (congestive heart failure) (HCC)   Chronic hypotension   Hyponatremia   Hypokalemia   Acute kidney injury superimposed on chronic kidney disease (HCC)   Coronary artery disease with stable angina   Chronic obstructive lung disease (HCC)   Malnutrition of moderate degree   Folate deficiency  Resolved Problems:   * No resolved hospital problems. Abigail Ortega Course:  Abigail Ortega is a 72 year old female with past medical history significant for chronic combined systolic and diastolic CHF, hypertension, COPD, hypothyroidism, CAD, hyperlipidemia, mood disorder who presented with severe diarrhea and cramps for over a week. In the emergency department, patient was found to be hypotensive, creatinine up to 1.9, C. difficile was positive. Patient was admitted for C. difficile colitis, AKI. She was started on oral vancomycin  She was also noted to have severe hypothyroidism with elevated TSH and low free T4.  Initially received IV Synthroid and then transition to p.o. TSH level is slowly improving.  PT/OT recommended SNF.  T  Assessment and Plan:  Acute C. difficile colitis Patient admitted with cramps, watery diarrhea.  C. difficile positive. Diarrhea and abdominal cramps have improved.  She completed 10 days of oral vancomycin on 07/07/2023.   Severe hypothyroidism TSH on admission 316.  TSH has improved to 270. She was initially treated  with IV levothyroxine. Continue oral levothyroxine and monitor TSH in the outpatient setting.   Folate deficiency Continue folic acid supplement   Malnutrition of moderate degree As evidenced by moderate loss of subcutaneous muscle mass and fat diffusely Continue nutritional supplements   Chronic obstructive lung disease (HCC) Stable. Continue bronchodilators   Coronary artery disease with stable angina Continue Lipitor, aspirin, Plavix, Ranexa, Imdur   Acute kidney injury superimposed on chronic kidney disease (HCC) Creatinine 1.8 on admission, improved to 1.05 which is close to her baseline CKD stage IIIa   Hypokalemia Improved   Hyponatremia Mild and asymptomatic   Chronic hypotension BP is okay.  Continue midodrine   Chronic combined systolic and diastolic CHF (congestive heart failure) (HCC) Resume Lasix at discharge   History of breast cancer of lower-inner quadrant of right female breast [facility]) Outpatient follow-up with oncologist and PCP   Discharge plan discussed with Abigail Ortega, sister, over the phone      Consultants: None Procedures performed: None  Disposition: Skilled nursing facility Diet recommendation:  Cardiac diet DISCHARGE MEDICATION: Allergies as of 07/09/2023       Reactions   Nausea Control [emetrol] Nausea And Vomiting   Other    Note: burning, nausea with XR 75 mg   Effexor [venlafaxine] Hives, Itching, Rash   Hydroxyzine Rash, Hives        Medication List     STOP taking these medications    4X Probiotic Tabs   albuterol 108 (90 Base) MCG/ACT inhaler Commonly known as: VENTOLIN HFA   ALPRAZolam 0.25 MG tablet Commonly known as: XANAX   dexlansoprazole 60 MG capsule Commonly known as:  DEXILANT   fluorometholone 0.1 % ophthalmic suspension Commonly known as: FML   HYDROcodone-acetaminophen 5-325 MG tablet Commonly known as: NORCO/VICODIN   ketorolac 0.5 % ophthalmic solution Commonly known as:  ACULAR   Nurtec 75 MG Tbdp Generic drug: Rimegepant Sulfate   promethazine 12.5 MG tablet Commonly known as: PHENERGAN   Symbicort 160-4.5 MCG/ACT inhaler Generic drug: budesonide-formoterol       TAKE these medications    acetaminophen 650 MG CR tablet Commonly known as: TYLENOL Take 1,300 mg by mouth every 8 (eight) hours as needed for pain.   aspirin EC 81 MG tablet Take 81 mg by mouth daily.   atorvastatin 80 MG tablet Commonly known as: LIPITOR TAKE 1 TABLET EVERY DAY   clopidogrel 75 MG tablet Commonly known as: PLAVIX TAKE 1 TABLET EVERY DAY (NEED MD APPOINTMENT)   dicyclomine 10 MG capsule Commonly known as: BENTYL TAKE 1 CAPSULE EVERY 8 HOURS FOR ABDOMINAL PAIN   ferrous sulfate 325 (65 FE) MG tablet Take 1 tablet (325 mg total) by mouth daily.   fexofenadine 60 MG tablet Commonly known as: ALLEGRA Take 0.5 tablets (30 mg total) by mouth 2 (two) times daily as needed (allergies.).   furosemide 20 MG tablet Commonly known as: LASIX Take 1 tablet (20 mg total) by mouth daily as needed.   hydrocortisone 2.5 % cream APPLY PEA SIZED TO RECTAL REGION EVERY 8 HOURS AS NEEDED FOR RELIEF   isosorbide mononitrate 120 MG 24 hr tablet Commonly known as: IMDUR TAKE 1 TABLET TWICE DAILY   levothyroxine 88 MCG tablet Commonly known as: SYNTHROID Take 1 tablet (88 mcg total) by mouth daily before breakfast.   lidocaine 5 % Commonly known as: Lidoderm Place 1 patch onto the skin every 12 (twelve) hours. Remove & Discard patch within 12 hours or as directed by MD   midodrine 10 MG tablet Commonly known as: PROAMATINE Take 10 mg by mouth 3 (three) times daily.   montelukast 10 MG tablet Commonly known as: SINGULAIR TAKE 1 TABLET AT BEDTIME   ondansetron 4 MG disintegrating tablet Commonly known as: ZOFRAN-ODT Take 1 tablet (4 mg total) by mouth every 6 (six) hours as needed for nausea or vomiting.   pantoprazole 40 MG tablet Commonly known as:  Protonix Take 1 tablet (40 mg total) by mouth daily.   ranolazine 500 MG 12 hr tablet Commonly known as: RANEXA TAKE 1 TABLET TWICE DAILY   traZODone 100 MG tablet Commonly known as: DESYREL Take 1 tablet (100 mg total) by mouth at bedtime. TAKE 1 TABLET BY MOUTH NIGHTLY AT BEDTIME AS NEEDED FOR SLEEP        Follow-up Information     Margaretann Loveless, MD Follow up in 1 week(s).   Specialty: Internal Medicine Contact information: [address] [place] [address] [phone]                Discharge Exam: Ceasar Mons Weights   06/30/23 0500  Weight: 60 kg   GEN: NAD SKIN: Warm and dry EYES: No pallor or icterus ENT: MMM CV: RRR PULM: CTA B ABD: soft, ND, NT, +BS CNS: AAO x 3, non focal EXT: No edema or tenderness   Condition at discharge: good  The results of significant diagnostics from this hospitalization (including imaging, microbiology, ancillary and laboratory) are listed below for reference.   Imaging Studies: US Venous Img Upper Uni Left (DVT) Result Date: 06/28/2023 CLINICAL DATA:  Left upper extremity swelling, initial encounter EXAM: LEFT UPPER EXTREMITY VENOUS  DOPPLER ULTRASOUND TECHNIQUE: Gray-scale sonography with graded compression, as well as color Doppler and duplex ultrasound were performed to evaluate the upper extremity deep venous system from the level of the subclavian vein and including the jugular, axillary, basilic, radial, ulnar and upper cephalic vein. Spectral Doppler was utilized to evaluate flow at rest and with distal augmentation maneuvers. COMPARISON:  None Available. FINDINGS: Contralateral Subclavian Vein: Respiratory phasicity is normal and symmetric with the symptomatic side. No evidence of thrombus. Normal compressibility. Internal Jugular Vein: No evidence of thrombus. Normal compressibility, respiratory phasicity and response to augmentation. Subclavian Vein: No evidence of thrombus. Normal compressibility, respiratory  phasicity and response to augmentation. Axillary Vein: No evidence of thrombus. Normal compressibility, respiratory phasicity and response to augmentation. Cephalic Vein: No evidence of thrombus. Normal compressibility, respiratory phasicity and response to augmentation. Basilic Vein: No evidence of thrombus. Normal compressibility, respiratory phasicity and response to augmentation. Brachial Veins: No evidence of thrombus. Normal compressibility, respiratory phasicity and response to augmentation. Radial Veins: No evidence of thrombus. Normal compressibility, respiratory phasicity and response to augmentation. Ulnar Veins: No evidence of thrombus. Normal compressibility, respiratory phasicity and response to augmentation. Venous Reflux:  None visualized. Other Findings:  Mild edema is noted. IMPRESSION: No evidence of DVT within the left upper extremity. Electronically Signed   By: Alcide Clever M.D.   On: 06/28/2023 23:04   CT ABDOMEN PELVIS WO CONTRAST Result Date: 06/27/2023 CLINICAL DATA:  Weakness, blood in stool for 2 months, abdominal pain, reported history of gastric cancer EXAM: CT ABDOMEN AND PELVIS WITHOUT CONTRAST TECHNIQUE: Multidetector CT imaging of the abdomen and pelvis was performed following the standard protocol without IV contrast. Unenhanced CT was performed per clinician order. Lack of IV contrast limits sensitivity and specificity, especially for evaluation of abdominal/pelvic solid viscera. RADIATION DOSE REDUCTION: This exam was performed according to the departmental dose-optimization program which includes automated exposure control, adjustment of the mA and/or kV according to patient size and/or use of iterative reconstruction technique. COMPARISON:  05/22/2023 FINDINGS: Lower chest: Stable right middle lobe scarring. No acute pleural or parenchymal lung disease. Decreased attenuation of the cardiac chambers may reflect underlying anemia. Hepatobiliary: Unremarkable unenhanced  appearance of the liver and gallbladder. Pancreas: Unremarkable unenhanced appearance. Spleen: Unremarkable unenhanced appearance. Adrenals/Urinary Tract: The adrenals are unremarkable. No urinary tract calculi or obstructive uropathy within either kidney. Bladder is unremarkable. Stomach/Bowel: No bowel obstruction or ileus. Scattered diverticulosis of the descending and sigmoid colon. There is long segment circumferential wall thickening extending from the distal descending colon through the rectum, compatible with inflammatory or infectious colitis. Marked perirectal and presacral fat stranding. Stable hiatal hernia. Vascular/Lymphatic: Aortic atherosclerosis. No enlarged abdominal or pelvic lymph nodes. Reproductive: Uterus and bilateral adnexa are unremarkable. Other: Trace pelvic free fluid. No free intraperitoneal gas. No abdominal wall hernia. Musculoskeletal: No acute or destructive bony abnormalities. Reconstructed images demonstrate no additional findings. IMPRESSION: 1. Wall thickening of the distal descending colon through the rectum, compatible with inflammatory or infectious colitis. 2. Trace pelvic free fluid and perirectal fat stranding, compatible with reactive changes. 3.  Aortic Atherosclerosis (ICD10-I70.0). 4. Decreased attenuation of the cardiac chambers, which may reflect underlying anemia. Electronically Signed   By: Sharlet Salina M.D.   On: 06/27/2023 22:39   DG Chest Port 1 View Result Date: 06/27/2023 CLINICAL DATA:  Clinical concern for sepsis.  Ex-smoker. EXAM: PORTABLE CHEST 1 VIEW COMPARISON:  05/22/2023 FINDINGS: Normal sized heart. Partially calcified thoracic aorta. Clear lungs with normal vascularity. Stable mild dextroconvex thoracic  scoliosis. IMPRESSION: No acute abnormality.  No evidence of pneumonia. Electronically Signed   By: Beckie Salts M.D.   On: 06/27/2023 21:03    Microbiology: Results for orders placed or performed during the hospital encounter of 06/27/23   Blood Culture (routine x 2)     Status: None   Collection Time: 06/27/23  9:04 PM   Specimen: BLOOD  Result Value Ref Range Status   Specimen Description BLOOD BLOOD LEFT ARM  Final   Special Requests   Final    BOTTLES DRAWN AEROBIC AND ANAEROBIC Blood Culture results may not be optimal due to an inadequate volume of blood received in culture bottles   Culture   Final    NO GROWTH 5 DAYS Performed at [facility], [address] [place], [place] [address]    Report Status 07/02/2023 FINAL  Final  Blood Culture (routine x 2)     Status: None   Collection Time: 06/27/23  9:04 PM   Specimen: BLOOD  Result Value Ref Range Status   Specimen Description BLOOD BLOOD RIGHT ARM  Final   Special Requests   Final    BOTTLES DRAWN AEROBIC AND ANAEROBIC Blood Culture results may not be optimal due to an inadequate volume of blood received in culture bottles   Culture   Final    NO GROWTH 5 DAYS Performed at [facility], [address] [place], [place] [address]    Report Status 07/02/2023 FINAL  Final  Gastrointestinal Panel by PCR , Stool     Status: None   Collection Time: 06/28/23  4:00 AM   Specimen: Stool  Result Value Ref Range Status   Campylobacter species NOT DETECTED NOT DETECTED Final   Plesimonas shigelloides NOT DETECTED NOT DETECTED Final   Salmonella species NOT DETECTED NOT DETECTED Final   Yersinia enterocolitica NOT DETECTED NOT DETECTED Final   Vibrio species NOT DETECTED NOT DETECTED Final   Vibrio cholerae NOT DETECTED NOT DETECTED Final   Enteroaggregative E coli (EAEC) NOT DETECTED NOT DETECTED Final   Enteropathogenic E coli (EPEC) NOT DETECTED NOT DETECTED Final   Enterotoxigenic E coli (ETEC) NOT DETECTED NOT DETECTED Final   Shiga like toxin producing E coli (STEC) NOT DETECTED NOT DETECTED Final   Shigella/Enteroinvasive E coli (EIEC) NOT DETECTED NOT DETECTED Final   Cryptosporidium NOT DETECTED NOT DETECTED Final   Cyclospora  cayetanensis NOT DETECTED NOT DETECTED Final   Entamoeba histolytica NOT DETECTED NOT DETECTED Final   Giardia lamblia NOT DETECTED NOT DETECTED Final   Adenovirus F40/41 NOT DETECTED NOT DETECTED Final   Astrovirus NOT DETECTED NOT DETECTED Final   Norovirus GI/GII NOT DETECTED NOT DETECTED Final   Rotavirus A NOT DETECTED NOT DETECTED Final   Sapovirus (I, II, IV, and V) NOT DETECTED NOT DETECTED Final    Comment: Performed at [facility], [address] [place], [place] [address]  C Difficile Quick Screen w PCR reflex     Status: Abnormal   Collection Time: 06/28/23  4:00 AM   Specimen: Stool  Result Value Ref Range Status   C Diff antigen POSITIVE (A) NEGATIVE Final   C Diff toxin NEGATIVE NEGATIVE Final   C Diff interpretation Results are indeterminate. See PCR results.  Final    Comment: Performed at [facility], [address] [place], [place] [address]  C. Diff by PCR, Reflexed     Status: Abnormal   Collection Time: 06/28/23  4:00 AM  Result Value Ref  Range Status   Toxigenic C. Difficile by PCR POSITIVE (A) NEGATIVE Final    Comment: Positive for toxigenic C. difficile with little to no toxin production. Only treat if clinical presentation suggests symptomatic illness. Performed at [facility], [address] [place], [place] [address]     Labs: CBC: Recent Labs  Lab 07/06/23 0351 07/07/23 0605 07/08/23 0401  WBC 6.3 6.3 5.7  HGB 8.1* 8.2* 7.8*  HCT 23.0* 24.0* 22.4*  MCV 90.2 92.3 91.1  PLT 275 292 296   Basic Metabolic Panel: Recent Labs  Lab 07/03/23 0416 07/04/23 0310 07/05/23 0518 07/06/23 0351 07/07/23 0605 07/08/23 0401  NA 128* 130* 129* 130* 131* 130*  K 3.7 3.2* 4.2 3.8 3.4* 4.2  CL 102 102 104 103 102 103  CO2 20* 19* 17* 22 19* 20*  GLUCOSE 90 87 80 87 87 87  BUN 12 11 12 12 12 11   CREATININE 1.33* 1.25* 1.37* 1.19* 1.19* 1.05*  CALCIUM 8.5* 8.5* 8.8* 8.4* 8.7* 8.3*  MG 2.2  --  1.8 1.6* 1.6* 2.0  PHOS   --   --   --  2.5  --   --    Liver Function Tests: No results for input(s): "AST", "ALT", "ALKPHOS", "BILITOT", "PROT", "ALBUMIN" in the last 168 hours. CBG: No results for input(s): "GLUCAP" in the last 168 hours.  Discharge time spent: greater than 30 minutes.  Signed: Lurene Shadow, MD [facility] 07/09/2023

## 2023-07-09 NOTE — Plan of Care (Signed)

## 2023-07-09 NOTE — TOC Transition Note (Signed)
Transition of Care [facility]) - Discharge Note   Patient Details  Name: Abigail Ortega MRN: [id number] Date of Birth: 08-May-1951  Transition of Care [facility]) CM/SW Contact:  Garret Reddish, RN Phone Number: 07/09/2023, 1:53 PM   Clinical Narrative:    Chart reviewed.  Noted that patient has orders for discharge today.  Patient has been approved for SNF.  Plan auth ID- [id number]  Auth ID- [id number] Approved for 07-06-23-07-10-23.  Next review date is 07-10-23.  I have spoken to [facility] with [facility] and she informs me that she will have a bed for patient today. Dorathy Daft reports that patient will go to room 505 and the number to call report is [phone].  I have sent patient's Discharge Summary, Discharge orders, and SNF transfer report via Epic hub.  I have left a voice message for patient's son Silvano Bilis that patient will be discharging to [facility] today.  I have informed Marcial Pacas that [facility] will transport patient to the [facility] today.    I have informed staff nurse of the above information.     Final next level of care: Skilled Nursing Facility Barriers to Discharge: No Barriers Identified   Patient Goals and CMS Choice   CMS Medicare.gov Compare Post Acute Care list provided to:: Patient Represenative (must comment) (Patient's son Marcial Pacas) Choice offered to / list presented to : Adult Children      Discharge Placement              Patient chooses bed at: [facility] Patient to be transferred to facility by: [facility] Name of family member notified: Jonetta Speak, RN, BSN, CM Patient and family notified of of transfer: 07/09/23  Discharge Plan and Services Additional resources added to the After Visit Summary for                                       Social Drivers of Health (SDOH) Interventions SDOH Screenings   Food Insecurity: No Food Insecurity (06/28/2023)  Housing: Unknown (06/28/2023)   Transportation Needs: No Transportation Needs (06/28/2023)  Utilities: Not At Risk (06/28/2023)  Tobacco Use: Medium Risk (06/27/2023)     Readmission Risk Interventions     No data to display

## 2023-07-09 NOTE — Plan of Care (Signed)
  Problem: Education: Goal: Knowledge of General Education information will improve Description: Including pain rating scale, medication(s)/side effects and non-pharmacologic comfort measures 07/09/2023 1529 by Calton Dach, RN Outcome: Adequate for Discharge 07/09/2023 1356 by Calton Dach, RN Outcome: Adequate for Discharge   Problem: Health Behavior/Discharge Planning: Goal: Ability to manage health-related needs will improve 07/09/2023 1529 by Calton Dach, RN Outcome: Adequate for Discharge 07/09/2023 1356 by Calton Dach, RN Outcome: Adequate for Discharge   Problem: Clinical Measurements: Goal: Ability to maintain clinical measurements within normal limits will improve 07/09/2023 1529 by Calton Dach, RN Outcome: Adequate for Discharge 07/09/2023 1356 by Calton Dach, RN Outcome: Adequate for Discharge Goal: Will remain free from infection 07/09/2023 1529 by Calton Dach, RN Outcome: Adequate for Discharge 07/09/2023 1356 by Calton Dach, RN Outcome: Adequate for Discharge Goal: Diagnostic test results will improve 07/09/2023 1529 by Calton Dach, RN Outcome: Adequate for Discharge 07/09/2023 1356 by Calton Dach, RN Outcome: Adequate for Discharge Goal: Respiratory complications will improve 07/09/2023 1529 by Calton Dach, RN Outcome: Adequate for Discharge 07/09/2023 1356 by Calton Dach, RN Outcome: Adequate for Discharge Goal: Cardiovascular complication will be avoided 07/09/2023 1529 by Calton Dach, RN Outcome: Adequate for Discharge 07/09/2023 1356 by Calton Dach, RN Outcome: Adequate for Discharge   Problem: Activity: Goal: Risk for activity intolerance will decrease 07/09/2023 1529 by Calton Dach, RN Outcome: Adequate for Discharge 07/09/2023 1356 by Calton Dach, RN Outcome: Adequate for Discharge   Problem: Nutrition: Goal: Adequate nutrition will be maintained 07/09/2023 1529 by Calton Dach,  RN Outcome: Adequate for Discharge 07/09/2023 1356 by Calton Dach, RN Outcome: Adequate for Discharge   Problem: Coping: Goal: Level of anxiety will decrease 07/09/2023 1529 by Calton Dach, RN Outcome: Adequate for Discharge 07/09/2023 1356 by Calton Dach, RN Outcome: Adequate for Discharge   Problem: Elimination: Goal: Will not experience complications related to bowel motility 07/09/2023 1529 by Calton Dach, RN Outcome: Adequate for Discharge 07/09/2023 1356 by Calton Dach, RN Outcome: Adequate for Discharge Goal: Will not experience complications related to urinary retention 07/09/2023 1529 by Calton Dach, RN Outcome: Adequate for Discharge 07/09/2023 1356 by Calton Dach, RN Outcome: Adequate for Discharge   Problem: Pain Management: Goal: General experience of comfort will improve 07/09/2023 1529 by Calton Dach, RN Outcome: Adequate for Discharge 07/09/2023 1356 by Calton Dach, RN Outcome: Adequate for Discharge   Problem: Safety: Goal: Ability to remain free from injury will improve 07/09/2023 1529 by Calton Dach, RN Outcome: Adequate for Discharge 07/09/2023 1356 by Calton Dach, RN Outcome: Adequate for Discharge   Problem: Skin Integrity: Goal: Risk for impaired skin integrity will decrease 07/09/2023 1529 by Calton Dach, RN Outcome: Adequate for Discharge 07/09/2023 1356 by Calton Dach, RN Outcome: Adequate for Discharge

## 2023-07-09 NOTE — Consult Note (Signed)
[facility] Liaison Note  07/09/2023  Abigail Ortega [birth year] [id number]  Location: RN Hospital Liaison met patient at bedside at [facility].  Insurance: [facility]   Abigail Ortega is a 72 y.o. female who is a Primary Care Patient of Margaretann Loveless, MD The patient was screened for  readmission hospitalization with noted extreme risk score for unplanned readmission risk with 1 IP/3 ED in 6 months.  The patient was assessed for potential Care Management service needs for post hospital transition for care coordination. Review of patient's electronic medical record reveals patient was admitted with Acute C-Difficile Colitis. Pt on isolation-liaison will preserve PPE for immediate staff personnel. Liaison spoke with pt remotely and explained VBCI services. Verified Dr. Welton Flakes as pt's primary provider. Pt recommended for SNF for ongoing STR. The facility will continue to address pt's ongoing needs.   Educated pt on services available with VBCI and encouraged pt if she needs ongoing care management services on managing her medical issues to request VBCI through her provider's office when she returns home from the SNF. No needs presented at this time.     VBCI Care Management/Population Health does not replace or interfere with any arrangements made by the Inpatient Transition of Care team.   For questions contact:   Elliot Cousin, RN, [facility] Liaison [place]   [facility], Population Health Office Hours MTWF  8:00 am-6:00 pm Direct Dial: [phone] mobile [phone] [Office toll free line] Office Hours are M-F 8:30 - 5 pm [email or web address]

## 2023-07-09 NOTE — Care Management Important Message (Signed)
Important Message  Patient Details  Name: KILIA FIGARO MRN: [id number] Date of Birth: 02-23-1951   Important Message Given:  Yes - Medicare IM Left IM with PTs Nurse as Pt was on Contact Precautions.    Bernadette Hoit 07/09/2023, 11:10 AM

## 2023-07-09 NOTE — Progress Notes (Signed)
Attempted to call report to [facility] twice with no answer or alternative.

## 2023-07-16 DIAGNOSIS — E876 Hypokalemia: Secondary | ICD-10-CM | POA: Diagnosis not present

## 2023-07-16 DIAGNOSIS — N179 Acute kidney failure, unspecified: Secondary | ICD-10-CM | POA: Diagnosis not present

## 2023-07-16 DIAGNOSIS — E538 Deficiency of other specified B group vitamins: Secondary | ICD-10-CM | POA: Diagnosis not present

## 2023-07-16 DIAGNOSIS — I2510 Atherosclerotic heart disease of native coronary artery without angina pectoris: Secondary | ICD-10-CM | POA: Diagnosis not present

## 2023-07-16 DIAGNOSIS — N182 Chronic kidney disease, stage 2 (mild): Secondary | ICD-10-CM | POA: Diagnosis not present

## 2023-07-16 DIAGNOSIS — E039 Hypothyroidism, unspecified: Secondary | ICD-10-CM | POA: Diagnosis not present

## 2023-07-16 DIAGNOSIS — J449 Chronic obstructive pulmonary disease, unspecified: Secondary | ICD-10-CM | POA: Diagnosis not present

## 2023-07-16 DIAGNOSIS — A0472 Enterocolitis due to Clostridium difficile, not specified as recurrent: Secondary | ICD-10-CM | POA: Diagnosis not present

## 2023-07-16 DIAGNOSIS — E871 Hypo-osmolality and hyponatremia: Secondary | ICD-10-CM | POA: Diagnosis not present

## 2023-07-17 DIAGNOSIS — E440 Moderate protein-calorie malnutrition: Secondary | ICD-10-CM | POA: Diagnosis not present

## 2023-07-17 DIAGNOSIS — N182 Chronic kidney disease, stage 2 (mild): Secondary | ICD-10-CM | POA: Diagnosis not present

## 2023-07-17 DIAGNOSIS — N179 Acute kidney failure, unspecified: Secondary | ICD-10-CM | POA: Diagnosis not present

## 2023-07-17 DIAGNOSIS — A0472 Enterocolitis due to Clostridium difficile, not specified as recurrent: Secondary | ICD-10-CM | POA: Diagnosis not present

## 2023-07-17 DIAGNOSIS — J449 Chronic obstructive pulmonary disease, unspecified: Secondary | ICD-10-CM | POA: Diagnosis not present

## 2023-07-17 DIAGNOSIS — I2510 Atherosclerotic heart disease of native coronary artery without angina pectoris: Secondary | ICD-10-CM | POA: Diagnosis not present

## 2023-07-17 DIAGNOSIS — E538 Deficiency of other specified B group vitamins: Secondary | ICD-10-CM | POA: Diagnosis not present

## 2023-07-17 DIAGNOSIS — I509 Heart failure, unspecified: Secondary | ICD-10-CM | POA: Diagnosis not present

## 2023-07-17 DIAGNOSIS — E039 Hypothyroidism, unspecified: Secondary | ICD-10-CM | POA: Diagnosis not present

## 2023-07-20 DIAGNOSIS — E039 Hypothyroidism, unspecified: Secondary | ICD-10-CM | POA: Diagnosis not present

## 2023-07-20 DIAGNOSIS — I2510 Atherosclerotic heart disease of native coronary artery without angina pectoris: Secondary | ICD-10-CM | POA: Diagnosis not present

## 2023-07-20 DIAGNOSIS — J449 Chronic obstructive pulmonary disease, unspecified: Secondary | ICD-10-CM | POA: Diagnosis not present

## 2023-07-20 DIAGNOSIS — A0472 Enterocolitis due to Clostridium difficile, not specified as recurrent: Secondary | ICD-10-CM | POA: Diagnosis not present

## 2023-07-20 DIAGNOSIS — E871 Hypo-osmolality and hyponatremia: Secondary | ICD-10-CM | POA: Diagnosis not present

## 2023-07-20 DIAGNOSIS — I509 Heart failure, unspecified: Secondary | ICD-10-CM | POA: Diagnosis not present

## 2023-07-20 DIAGNOSIS — E440 Moderate protein-calorie malnutrition: Secondary | ICD-10-CM | POA: Diagnosis not present

## 2023-07-20 DIAGNOSIS — I9589 Other hypotension: Secondary | ICD-10-CM | POA: Diagnosis not present

## 2023-07-23 DIAGNOSIS — N182 Chronic kidney disease, stage 2 (mild): Secondary | ICD-10-CM | POA: Diagnosis not present

## 2023-07-23 DIAGNOSIS — E871 Hypo-osmolality and hyponatremia: Secondary | ICD-10-CM | POA: Diagnosis not present

## 2023-07-23 DIAGNOSIS — I9589 Other hypotension: Secondary | ICD-10-CM | POA: Diagnosis not present

## 2023-07-23 DIAGNOSIS — E039 Hypothyroidism, unspecified: Secondary | ICD-10-CM | POA: Diagnosis not present

## 2023-07-23 DIAGNOSIS — E440 Moderate protein-calorie malnutrition: Secondary | ICD-10-CM | POA: Diagnosis not present

## 2023-07-23 DIAGNOSIS — A0472 Enterocolitis due to Clostridium difficile, not specified as recurrent: Secondary | ICD-10-CM | POA: Diagnosis not present

## 2023-07-23 DIAGNOSIS — I509 Heart failure, unspecified: Secondary | ICD-10-CM | POA: Diagnosis not present

## 2023-07-23 DIAGNOSIS — J449 Chronic obstructive pulmonary disease, unspecified: Secondary | ICD-10-CM | POA: Diagnosis not present

## 2023-07-23 DIAGNOSIS — N179 Acute kidney failure, unspecified: Secondary | ICD-10-CM | POA: Diagnosis not present

## 2023-08-01 ENCOUNTER — Telehealth: Payer: Self-pay | Admitting: Internal Medicine

## 2023-08-01 ENCOUNTER — Telehealth: Payer: Self-pay | Admitting: Gastroenterology

## 2023-08-01 NOTE — Progress Notes (Signed)
Celso Amy, PA-C [address]  [address]  [place], [place] [address]  Main: [phone]  Fax: [phone]   Primary Care Physician: Margaretann Loveless, MD  Primary Gastroenterologist:  Celso Amy, PA-C / Dr. Wyline Mood    CC: Severe Diarrhea, Weight Loss, Anemia, Weakness  HPI: Abigail Ortega is a 73 y.o. female, here for Hospital Followup. Currently has C. Difficile, Severe Diarrhea, Weight Loss and Weakness.  Sitting in a wheelchair.  Brought in by her son today.  Patient is a poor historian.  Hospitalized 06/27/23 for Acute C. Difficile Colitis Treated with Vancomycin.  Presented with severe diarrhea and cramps for over a week. In the emergency department, patient was found to be hypotensive, creatinine up to 1.9, C. difficile was positive. Patient was admitted for C. difficile colitis, AKI. She was started on oral vancomycin.  She was also noted to have severe hypothyroidism with elevated TSH and low free T4.  Initially received IV Synthroid and then transition to p.o. TSH level is slowly improving.   After hospital discharge, she was sent to a nursing facility for 2 weeks.  Diarrhea improved.  She finished vancomycin.  She was sent home and currently lives with her son.  Patient has had recurrent severe malodorous watery diarrhea for several weeks.  Wears depends and has fecal incontinence.  No current treatment for C. difficile.  07/08/23 labs showed severe anemia with hemoglobin 7.8.  She received blood transfusion 07/12/2023.  She has not had repeat lab work since that time.  Past medical history significant for chronic combined systolic and diastolic CHF, hypertension, COPD, hypothyroidism, CAD, hyperlipidemia, mood disorder.    GI History:  12/23/2016: Colonoscopy performed by Dr. Bosie Clos showed internal hemorrhoids as well external hemorrhoids.  2 mm polyp resected in the cecum.  Colon prep was fair.  Diverticulosis of the colon was noted.   11/2016 EGD: 5 cm  hiatal hernia was noted.    Current Outpatient Medications  Medication Sig Dispense Refill   acetaminophen (TYLENOL) 650 MG CR tablet Take 1,300 mg by mouth every 8 (eight) hours as needed for pain.     aspirin EC 81 MG tablet Take 81 mg by mouth daily.     atorvastatin (LIPITOR) 80 MG tablet TAKE 1 TABLET EVERY DAY 90 tablet 3   clopidogrel (PLAVIX) 75 MG tablet TAKE 1 TABLET EVERY DAY (NEED MD APPOINTMENT) 90 tablet 3   dicyclomine (BENTYL) 10 MG capsule TAKE 1 CAPSULE EVERY 8 HOURS FOR ABDOMINAL PAIN 270 capsule 3   ferrous sulfate 325 (65 FE) MG tablet Take 1 tablet (325 mg total) by mouth daily. 90 tablet 3   fexofenadine (ALLEGRA) 60 MG tablet Take 0.5 tablets (30 mg total) by mouth 2 (two) times daily as needed (allergies.). 90 tablet 3   furosemide (LASIX) 20 MG tablet Take 1 tablet (20 mg total) by mouth daily as needed. 90 tablet 3   hydrocortisone 2.5 % cream APPLY PEA SIZED TO RECTAL REGION EVERY 8 HOURS AS NEEDED FOR RELIEF 20 g 11   isosorbide mononitrate (IMDUR) 120 MG 24 hr tablet TAKE 1 TABLET TWICE DAILY 180 tablet 3   levothyroxine (SYNTHROID) 88 MCG tablet Take 1 tablet (88 mcg total) by mouth daily before breakfast. 90 tablet 3   lidocaine (LIDODERM) 5 % Place 1 patch onto the skin every 12 (twelve) hours. Remove & Discard patch within 12 hours or as directed by MD 10 patch 0   midodrine (PROAMATINE) 10 MG tablet  Take 10 mg by mouth 3 (three) times daily.     montelukast (SINGULAIR) 10 MG tablet TAKE 1 TABLET AT BEDTIME 90 tablet 3   ondansetron (ZOFRAN-ODT) 4 MG disintegrating tablet Take 1 tablet (4 mg total) by mouth every 6 (six) hours as needed for nausea or vomiting. 20 tablet 0   pantoprazole (PROTONIX) 40 MG tablet Take 1 tablet (40 mg total) by mouth daily. 30 tablet 1   ranolazine (RANEXA) 500 MG 12 hr tablet TAKE 1 TABLET TWICE DAILY 180 tablet 3   traZODone (DESYREL) 100 MG tablet Take 1 tablet (100 mg total) by mouth at bedtime. TAKE 1 TABLET BY MOUTH NIGHTLY AT  BEDTIME AS NEEDED FOR SLEEP 90 tablet 3   No current facility-administered medications for this visit.    Allergies as of 08/02/2023 - Review Complete 08/02/2023  Allergen Reaction Noted   Nausea control [emetrol] Nausea And Vomiting 06/19/2017   Other  01/09/2017   Effexor [venlafaxine] Hives, Itching, and Rash 08/21/2022   Hydroxyzine Rash and Hives 12/20/2018    Past Medical History:  Diagnosis Date   Allergic rhinitis 12/31/2017   Anemia in chronic kidney disease 06/23/2019   Anemia in chronic kidney disease 06/23/2019   Anxiety    Anxiety disorder due to medical condition 04/02/2018   Arthritis    Breast cancer (HCC) 2014   Right breast cancer - chemo, radiation and Mastectomy   CHF (congestive heart failure) (HCC) 2013   Chronic kidney disease    Clotting disorder (HCC)    blood clots   Depression    GERD (gastroesophageal reflux disease)    GI bleed    H/O heart artery stent    Headache    Heart attack (HCC) 2012   coronary stent x2 completed by Dr. Juliann Pares.   Heart attack (HCC) 04/29/2013   Hemorrhoids    Hypertension    Hypothyroidism    IBS (irritable bowel syndrome)    Malignant neoplasm of lower-inner quadrant of female breast (HCC) 01/20/2013   invasive mammary cancer, minimal 0.85 cm.  Histologic grade 1.   Persistent insomnia 04/02/2018   Personal history of chemotherapy 2014   BREAST CA   Personal history of radiation therapy 2014   BREAST CA   Secondary hyperparathyroidism of renal origin (HCC) 09/08/2020   Thyroid disease     Past Surgical History:  Procedure Laterality Date   BOTOX INJECTION N/A 03/14/2017   Procedure: BOTOX INJECTION;  Surgeon: Leafy Ro, MD;  Location: [facility] ORS;  Service: General;  Laterality: N/A;   BOTOX INJECTION N/A 05/09/2018   Procedure: BOTOX INJECTION;  Surgeon: Leafy Ro, MD;  Location: [facility] ORS;  Service: General;  Laterality: N/A;   BREAST BIOPSY Right 2014   positive   BREAST SURGERY Right 02-21-2013    right mastectomy   CARDIAC CATHETERIZATION     COLONOSCOPY W/ BIOPSIES  09/15/2010   Colonoscopy completed by Lutricia Feil, M.D. Tubular adenoma of the ascending colon and descending colon reported up to 0.4 cm in diameter. No atypia.   COLONOSCOPY WITH PROPOFOL N/A 12/23/2016   Procedure: COLONOSCOPY WITH PROPOFOL;  Surgeon: Charlott Rakes, MD;  Location: [facility] ENDOSCOPY;  Service: Endoscopy;  Laterality: N/A;   ESOPHAGOGASTRODUODENOSCOPY N/A 12/13/2016   Procedure: ESOPHAGOGASTRODUODENOSCOPY (EGD);  Surgeon: Wyline Mood, MD;  Location: [facility] ENDOSCOPY;  Service: Endoscopy;  Laterality: N/A;   LEFT HEART CATH N/A 04/02/2020   Procedure: Left Heart Cath with Coronary Angiography;  Surgeon: Laurier Nancy, MD;  Location: [facility] INVASIVE  CV LAB;  Service: Cardiovascular;  Laterality: N/A;   LEFT HEART CATH AND CORONARY ANGIOGRAPHY Left 08/29/2018   Procedure: LEFT HEART CATH AND CORONARY ANGIOGRAPHY;  Surgeon: Laurier Nancy, MD;  Location: [facility] INVASIVE CV LAB;  Service: Cardiovascular;  Laterality: Left;   MASTECTOMY Right 2014   BREAST CA   PORTA CATH INSERTION     SPHINCTEROTOMY N/A 05/09/2018   Procedure: SPHINCTEROTOMY;  Surgeon: Leafy Ro, MD;  Location: [facility] ORS;  Service: General;  Laterality: N/A;   UPPER GI ENDOSCOPY  09/15/2010     Completed by Lutricia Feil, M.D. for nausea. Normal exam reported.   VASCULAR SURGERY      Review of Systems:    All systems reviewed and negative except where noted in HPI.   Physical Examination:   BP 113/69   Pulse 81   Temp 98 F (36.7 C)   Ht 5\' 5"  (1.651 m)   Wt 128 lb (58.1 kg)   BMI 21.30 kg/m   General: Thin, feeble, frail, elderly female sitting in a wheelchair.  Confused.  In no acute distress.  Lungs: Clear to auscultation bilaterally. Non-labored. Heart: Regular rate and rhythm, distant heart sounds, no murmurs rubs or gallops.  Abdomen: Bowel sounds are quiet; Abdomen is Soft; No hepatosplenomegaly, masses or hernias; mild  generalized abdominal Tenderness; No guarding or rebound tenderness. Skin:  Poor Skin Turgur.  Looks Pale.   Imaging Studies: No results found.  Assessment and Plan:   Abigail Ortega is a 73 y.o. y/o female presents for severe diarrhea, weakness, weight loss, and anemia.  Hospitalized 06/27/2023 and treated with vancomycin for C. difficile colitis.  Has not had any repeat testing since then.  Currently having severe recurrent diarrhea, hypotension, fatigue.  Labs 07/08/2023 showed hemoglobin 7.8.  She received blood transfusion 07/12/2023.  Has not had repeat labs since then.  1.  C. difficile diarrhea 2.  Severe anemia 3.  Weakness 4.  Mental status change 5.  Hypotension 6.  Hypothyroidism 7.  Multiple comorbidities  We discussed outpatient workup with labs and repeat C. difficile test, however I do not feel that she is able to be managed outpatient.  She appears hypotensive and confused today.  I am recommend they go to the ED for stat labs and further evaluation and treatment.  Likely needs inpatient hospital management.  Celso Amy, PA-C  Follow up will be based on hospital recommendations.

## 2023-08-01 NOTE — Telephone Encounter (Signed)
 The patient and her granddaughter [name] called in to schedule an office visit. I scheduled her with [name] on 08/02/23 at 9:15 am. Her granddaughter is requesting for her to be referred to a OBGYN for discharge.

## 2023-08-01 NOTE — Telephone Encounter (Signed)
 Entered in error

## 2023-08-02 ENCOUNTER — Encounter: Payer: Self-pay | Admitting: Emergency Medicine

## 2023-08-02 ENCOUNTER — Encounter: Payer: Self-pay | Admitting: Physician Assistant

## 2023-08-02 ENCOUNTER — Other Ambulatory Visit: Payer: Self-pay

## 2023-08-02 ENCOUNTER — Emergency Department: Payer: Medicare HMO

## 2023-08-02 ENCOUNTER — Inpatient Hospital Stay
Admission: EM | Admit: 2023-08-02 | Discharge: 2023-08-20 | DRG: 392 | Disposition: A | Discharge status: Skilled Nursing Facility | Payer: Medicare HMO | Source: Ambulatory Visit | Attending: Internal Medicine | Admitting: Internal Medicine

## 2023-08-02 ENCOUNTER — Ambulatory Visit (INDEPENDENT_AMBULATORY_CARE_PROVIDER_SITE_OTHER): Payer: Medicare HMO | Admitting: Physician Assistant

## 2023-08-02 VITALS — BP 113/69 | HR 81 | Temp 98.0°F | Ht 65.0 in | Wt 128.0 lb

## 2023-08-02 DIAGNOSIS — A0472 Enterocolitis due to Clostridium difficile, not specified as recurrent: Secondary | ICD-10-CM | POA: Diagnosis not present

## 2023-08-02 DIAGNOSIS — Z66 Do not resuscitate: Secondary | ICD-10-CM | POA: Diagnosis present

## 2023-08-02 DIAGNOSIS — Z853 Personal history of malignant neoplasm of breast: Secondary | ICD-10-CM

## 2023-08-02 DIAGNOSIS — I700 Atherosclerosis of aorta: Secondary | ICD-10-CM | POA: Diagnosis not present

## 2023-08-02 DIAGNOSIS — I5042 Chronic combined systolic (congestive) and diastolic (congestive) heart failure: Secondary | ICD-10-CM | POA: Diagnosis present

## 2023-08-02 DIAGNOSIS — R9431 Abnormal electrocardiogram [ECG] [EKG]: Secondary | ICD-10-CM

## 2023-08-02 DIAGNOSIS — Z87898 Personal history of other specified conditions: Secondary | ICD-10-CM

## 2023-08-02 DIAGNOSIS — D649 Anemia, unspecified: Secondary | ICD-10-CM | POA: Diagnosis not present

## 2023-08-02 DIAGNOSIS — E785 Hyperlipidemia, unspecified: Secondary | ICD-10-CM | POA: Diagnosis present

## 2023-08-02 DIAGNOSIS — N189 Chronic kidney disease, unspecified: Secondary | ICD-10-CM | POA: Diagnosis present

## 2023-08-02 DIAGNOSIS — I130 Hypertensive heart and chronic kidney disease with heart failure and stage 1 through stage 4 chronic kidney disease, or unspecified chronic kidney disease: Secondary | ICD-10-CM | POA: Diagnosis present

## 2023-08-02 DIAGNOSIS — K449 Diaphragmatic hernia without obstruction or gangrene: Secondary | ICD-10-CM | POA: Diagnosis present

## 2023-08-02 DIAGNOSIS — R4182 Altered mental status, unspecified: Secondary | ICD-10-CM

## 2023-08-02 DIAGNOSIS — Z923 Personal history of irradiation: Secondary | ICD-10-CM

## 2023-08-02 DIAGNOSIS — Z8619 Personal history of other infectious and parasitic diseases: Secondary | ICD-10-CM

## 2023-08-02 DIAGNOSIS — C50311 Malignant neoplasm of lower-inner quadrant of right female breast: Secondary | ICD-10-CM | POA: Diagnosis not present

## 2023-08-02 DIAGNOSIS — Z7902 Long term (current) use of antithrombotics/antiplatelets: Secondary | ICD-10-CM

## 2023-08-02 DIAGNOSIS — Y838 Other surgical procedures as the cause of abnormal reaction of the patient, or of later complication, without mention of misadventure at the time of the procedure: Secondary | ICD-10-CM | POA: Diagnosis not present

## 2023-08-02 DIAGNOSIS — Z9011 Acquired absence of right breast and nipple: Secondary | ICD-10-CM | POA: Diagnosis not present

## 2023-08-02 DIAGNOSIS — E538 Deficiency of other specified B group vitamins: Secondary | ICD-10-CM | POA: Diagnosis present

## 2023-08-02 DIAGNOSIS — I959 Hypotension, unspecified: Secondary | ICD-10-CM | POA: Diagnosis not present

## 2023-08-02 DIAGNOSIS — Z9221 Personal history of antineoplastic chemotherapy: Secondary | ICD-10-CM

## 2023-08-02 DIAGNOSIS — K209 Esophagitis, unspecified without bleeding: Secondary | ICD-10-CM | POA: Diagnosis not present

## 2023-08-02 DIAGNOSIS — E039 Hypothyroidism, unspecified: Secondary | ICD-10-CM | POA: Diagnosis present

## 2023-08-02 DIAGNOSIS — I2510 Atherosclerotic heart disease of native coronary artery without angina pectoris: Secondary | ICD-10-CM | POA: Diagnosis present

## 2023-08-02 DIAGNOSIS — K91840 Postprocedural hemorrhage and hematoma of a digestive system organ or structure following a digestive system procedure: Secondary | ICD-10-CM | POA: Diagnosis not present

## 2023-08-02 DIAGNOSIS — N2581 Secondary hyperparathyroidism of renal origin: Secondary | ICD-10-CM | POA: Diagnosis present

## 2023-08-02 DIAGNOSIS — R531 Weakness: Secondary | ICD-10-CM

## 2023-08-02 DIAGNOSIS — M810 Age-related osteoporosis without current pathological fracture: Secondary | ICD-10-CM | POA: Diagnosis present

## 2023-08-02 DIAGNOSIS — K529 Noninfective gastroenteritis and colitis, unspecified: Secondary | ICD-10-CM

## 2023-08-02 DIAGNOSIS — K580 Irritable bowel syndrome with diarrhea: Principal | ICD-10-CM | POA: Diagnosis present

## 2023-08-02 DIAGNOSIS — K591 Functional diarrhea: Secondary | ICD-10-CM | POA: Diagnosis not present

## 2023-08-02 DIAGNOSIS — K5730 Diverticulosis of large intestine without perforation or abscess without bleeding: Secondary | ICD-10-CM | POA: Diagnosis present

## 2023-08-02 DIAGNOSIS — Y733 Surgical instruments, materials and gastroenterology and urology devices (including sutures) associated with adverse incidents: Secondary | ICD-10-CM | POA: Diagnosis not present

## 2023-08-02 DIAGNOSIS — Z955 Presence of coronary angioplasty implant and graft: Secondary | ICD-10-CM | POA: Diagnosis not present

## 2023-08-02 DIAGNOSIS — Z6821 Body mass index (BMI) 21.0-21.9, adult: Secondary | ICD-10-CM

## 2023-08-02 DIAGNOSIS — D62 Acute posthemorrhagic anemia: Secondary | ICD-10-CM | POA: Diagnosis not present

## 2023-08-02 DIAGNOSIS — R911 Solitary pulmonary nodule: Secondary | ICD-10-CM | POA: Diagnosis present

## 2023-08-02 DIAGNOSIS — K2101 Gastro-esophageal reflux disease with esophagitis, with bleeding: Secondary | ICD-10-CM | POA: Diagnosis present

## 2023-08-02 DIAGNOSIS — I9589 Other hypotension: Secondary | ICD-10-CM | POA: Diagnosis present

## 2023-08-02 DIAGNOSIS — Z888 Allergy status to other drugs, medicaments and biological substances status: Secondary | ICD-10-CM

## 2023-08-02 DIAGNOSIS — D631 Anemia in chronic kidney disease: Secondary | ICD-10-CM | POA: Diagnosis present

## 2023-08-02 DIAGNOSIS — R634 Abnormal weight loss: Secondary | ICD-10-CM

## 2023-08-02 DIAGNOSIS — I252 Old myocardial infarction: Secondary | ICD-10-CM

## 2023-08-02 DIAGNOSIS — Z79890 Hormone replacement therapy (postmenopausal): Secondary | ICD-10-CM

## 2023-08-02 DIAGNOSIS — R197 Diarrhea, unspecified: Secondary | ICD-10-CM | POA: Diagnosis present

## 2023-08-02 DIAGNOSIS — R54 Age-related physical debility: Secondary | ICD-10-CM | POA: Diagnosis present

## 2023-08-02 DIAGNOSIS — M1990 Unspecified osteoarthritis, unspecified site: Secondary | ICD-10-CM | POA: Diagnosis present

## 2023-08-02 DIAGNOSIS — Z87891 Personal history of nicotine dependence: Secondary | ICD-10-CM | POA: Diagnosis not present

## 2023-08-02 DIAGNOSIS — I25118 Atherosclerotic heart disease of native coronary artery with other forms of angina pectoris: Secondary | ICD-10-CM | POA: Diagnosis not present

## 2023-08-02 DIAGNOSIS — R918 Other nonspecific abnormal finding of lung field: Secondary | ICD-10-CM | POA: Diagnosis not present

## 2023-08-02 DIAGNOSIS — Z7982 Long term (current) use of aspirin: Secondary | ICD-10-CM

## 2023-08-02 DIAGNOSIS — I5043 Acute on chronic combined systolic (congestive) and diastolic (congestive) heart failure: Secondary | ICD-10-CM | POA: Diagnosis not present

## 2023-08-02 DIAGNOSIS — A498 Other bacterial infections of unspecified site: Secondary | ICD-10-CM

## 2023-08-02 DIAGNOSIS — E861 Hypovolemia: Secondary | ICD-10-CM | POA: Diagnosis present

## 2023-08-02 DIAGNOSIS — E876 Hypokalemia: Principal | ICD-10-CM | POA: Diagnosis present

## 2023-08-02 DIAGNOSIS — Z7401 Bed confinement status: Secondary | ICD-10-CM | POA: Diagnosis not present

## 2023-08-02 DIAGNOSIS — Z79899 Other long term (current) drug therapy: Secondary | ICD-10-CM

## 2023-08-02 DIAGNOSIS — Z800 Family history of malignant neoplasm of digestive organs: Secondary | ICD-10-CM

## 2023-08-02 DIAGNOSIS — J449 Chronic obstructive pulmonary disease, unspecified: Secondary | ICD-10-CM | POA: Diagnosis not present

## 2023-08-02 DIAGNOSIS — E440 Moderate protein-calorie malnutrition: Secondary | ICD-10-CM | POA: Diagnosis not present

## 2023-08-02 DIAGNOSIS — K929 Disease of digestive system, unspecified: Secondary | ICD-10-CM | POA: Diagnosis not present

## 2023-08-02 DIAGNOSIS — Z803 Family history of malignant neoplasm of breast: Secondary | ICD-10-CM

## 2023-08-02 DIAGNOSIS — R636 Underweight: Secondary | ICD-10-CM | POA: Diagnosis present

## 2023-08-02 HISTORY — DX: Anal fissure, unspecified: K60.2

## 2023-08-02 LAB — GASTROINTESTINAL PANEL BY PCR, STOOL (REPLACES STOOL CULTURE)

## 2023-08-02 LAB — COMPREHENSIVE METABOLIC PANEL
ALT: 10 U/L (ref 0–44)
AST: 21 U/L (ref 15–41)
Albumin: 2.9 g/dL — ABNORMAL LOW (ref 3.5–5.0)
Alkaline Phosphatase: 106 U/L (ref 38–126)
Anion gap: 18 — ABNORMAL HIGH (ref 5–15)
BUN: 13 mg/dL (ref 8–23)
CO2: 22 mmol/L (ref 22–32)
Calcium: 8.1 mg/dL — ABNORMAL LOW (ref 8.9–10.3)
Chloride: 95 mmol/L — ABNORMAL LOW (ref 98–111)
Creatinine, Ser: 1.13 mg/dL — ABNORMAL HIGH (ref 0.44–1.00)
GFR, Estimated: 51 mL/min — ABNORMAL LOW (ref >60)
Glucose, Bld: 80 mg/dL (ref 70–99)
Potassium: 2.6 mmol/L — CL (ref 3.5–5.1)
Sodium: 135 mmol/L (ref 135–145)
Total Bilirubin: 1.4 mg/dL — ABNORMAL HIGH (ref 0.0–1.2)
Total Protein: 6.3 g/dL — ABNORMAL LOW (ref 6.5–8.1)

## 2023-08-02 LAB — CBC WITH DIFFERENTIAL/PLATELET
Abs Immature Granulocytes: 0.05 10*3/uL (ref 0.00–0.07)
Basophils Absolute: 0.1 10*3/uL (ref 0.0–0.1)
Basophils Relative: 1 %
Eosinophils Absolute: 0.1 10*3/uL (ref 0.0–0.5)
Eosinophils Relative: 1 %
HCT: 27.5 % — ABNORMAL LOW (ref 36.0–46.0)
Hemoglobin: 8.9 g/dL — ABNORMAL LOW (ref 12.0–15.0)
Immature Granulocytes: 1 %
Lymphocytes Relative: 19 %
Lymphs Abs: 1.4 10*3/uL (ref 0.7–4.0)
MCH: 31.3 pg (ref 26.0–34.0)
MCHC: 32.4 g/dL (ref 30.0–36.0)
MCV: 96.8 fL (ref 80.0–100.0)
Monocytes Absolute: 0.5 10*3/uL (ref 0.1–1.0)
Monocytes Relative: 6 %
Neutro Abs: 5.3 10*3/uL (ref 1.7–7.7)
Neutrophils Relative %: 72 %
Platelets: 533 10*3/uL — ABNORMAL HIGH (ref 150–400)
RBC: 2.84 MIL/uL — ABNORMAL LOW (ref 3.87–5.11)
RDW: 15.3 % (ref 11.5–15.5)
WBC: 7.3 10*3/uL (ref 4.0–10.5)
nRBC: 0 % (ref 0.0–0.2)

## 2023-08-02 LAB — TYPE AND SCREEN
ABO/RH(D): O POS
Antibody Screen: NEGATIVE

## 2023-08-02 LAB — CLOSTRIDIUM DIFFICILE BY PCR, REFLEXED: Toxigenic C. Difficile by PCR: POSITIVE — AB

## 2023-08-02 LAB — PROTIME-INR
INR: 1 (ref 0.8–1.2)
Prothrombin Time: 13.7 s (ref 11.4–15.2)

## 2023-08-02 LAB — MAGNESIUM: Magnesium: 1.7 mg/dL (ref 1.7–2.4)

## 2023-08-02 LAB — C DIFFICILE QUICK SCREEN W PCR REFLEX
C Diff antigen: POSITIVE — AB
C Diff toxin: NEGATIVE

## 2023-08-02 LAB — LACTIC ACID, PLASMA: Lactic Acid, Venous: 1.6 mmol/L (ref 0.5–1.9)

## 2023-08-02 LAB — TSH: TSH: 89 u[IU]/mL — ABNORMAL HIGH (ref 0.350–4.500)

## 2023-08-02 MED ORDER — ACETAMINOPHEN 650 MG RE SUPP: 650.0000 mg | Freq: Four times a day (QID) | RECTAL | Status: DC | PRN

## 2023-08-02 MED ORDER — MIDODRINE HCL 5 MG PO TABS
10.0000 mg | ORAL_TABLET | Freq: Three times a day (TID) | ORAL | Status: DC
  Administered 2023-08-03 – 2023-08-20 (×48): 10 mg via ORAL
  Filled 2023-08-02 (×48): qty 2

## 2023-08-02 MED ORDER — FERROUS SULFATE 325 (65 FE) MG PO TABS
325.0000 mg | ORAL_TABLET | Freq: Every day | ORAL | Status: DC
Start: 2023-08-03 — End: 2023-08-21
  Administered 2023-08-03 – 2023-08-20 (×17): 325 mg via ORAL
  Filled 2023-08-02 (×18): qty 1

## 2023-08-02 MED ORDER — MONTELUKAST SODIUM 10 MG PO TABS
10.0000 mg | ORAL_TABLET | Freq: Every day | ORAL | Status: DC
  Administered 2023-08-02 – 2023-08-20 (×19): 10 mg via ORAL
  Filled 2023-08-02 (×19): qty 1

## 2023-08-02 MED ORDER — ONDANSETRON HCL 4 MG/2ML IJ SOLN
4.0000 mg | Freq: Three times a day (TID) | INTRAMUSCULAR | Status: DC | PRN
  Administered 2023-08-03: 4 mg via INTRAVENOUS
  Filled 2023-08-02: qty 2

## 2023-08-02 MED ORDER — CLOPIDOGREL BISULFATE 75 MG PO TABS
75.0000 mg | ORAL_TABLET | Freq: Every day | ORAL | Status: DC
  Administered 2023-08-02 – 2023-08-20 (×18): 75 mg via ORAL
  Filled 2023-08-02 (×19): qty 1

## 2023-08-02 MED ORDER — DICYCLOMINE HCL 10 MG PO CAPS
10.0000 mg | ORAL_CAPSULE | Freq: Three times a day (TID) | ORAL | Status: DC
  Administered 2023-08-03 – 2023-08-20 (×48): 10 mg via ORAL
  Filled 2023-08-02 (×49): qty 1

## 2023-08-02 MED ORDER — ENOXAPARIN SODIUM 40 MG/0.4ML IJ SOSY
40.0000 mg | PREFILLED_SYRINGE | INTRAMUSCULAR | Status: AC
Start: 2023-08-02 — End: ?
  Administered 2023-08-02 – 2023-08-14 (×13): 40 mg via SUBCUTANEOUS
  Filled 2023-08-02 (×13): qty 0.4

## 2023-08-02 MED ORDER — ONDANSETRON HCL 4 MG PO TABS
4.0000 mg | ORAL_TABLET | Freq: Three times a day (TID) | ORAL | Status: DC | PRN
  Administered 2023-08-04 – 2023-08-07 (×2): 4 mg via ORAL
  Filled 2023-08-02 (×3): qty 1

## 2023-08-02 MED ORDER — SODIUM CHLORIDE 0.9 % IV BOLUS
1000.0000 mL | Freq: Once | INTRAVENOUS | Status: AC
Start: 2023-08-02 — End: 2023-08-02
  Administered 2023-08-02: 1000 mL via INTRAVENOUS

## 2023-08-02 MED ORDER — FIDAXOMICIN 200 MG PO TABS
200.0000 mg | ORAL_TABLET | Freq: Two times a day (BID) | ORAL | Status: AC
  Administered 2023-08-02 – 2023-08-12 (×20): 200 mg via ORAL
  Filled 2023-08-02 (×20): qty 1

## 2023-08-02 MED ORDER — ACETAMINOPHEN 325 MG PO TABS
650.0000 mg | ORAL_TABLET | Freq: Four times a day (QID) | ORAL | Status: DC | PRN
  Administered 2023-08-04 – 2023-08-19 (×6): 650 mg via ORAL
  Filled 2023-08-02 (×6): qty 2

## 2023-08-02 MED ORDER — ATORVASTATIN CALCIUM 20 MG PO TABS
80.0000 mg | ORAL_TABLET | Freq: Every day | ORAL | Status: DC
  Administered 2023-08-02 – 2023-08-20 (×18): 80 mg via ORAL
  Filled 2023-08-02 (×19): qty 4

## 2023-08-02 MED ORDER — TRAZODONE HCL 100 MG PO TABS
100.0000 mg | ORAL_TABLET | Freq: Every evening | ORAL | Status: DC | PRN
  Administered 2023-08-03 – 2023-08-12 (×7): 100 mg via ORAL
  Filled 2023-08-02 (×8): qty 1

## 2023-08-02 MED ORDER — POTASSIUM CHLORIDE 10 MEQ/100ML IV SOLN
10.0000 meq | INTRAVENOUS | Status: AC
  Administered 2023-08-02 (×2): 10 meq via INTRAVENOUS
  Filled 2023-08-02 (×2): qty 100

## 2023-08-02 MED ORDER — LEVOTHYROXINE SODIUM 88 MCG PO TABS
88.0000 ug | ORAL_TABLET | Freq: Every day | ORAL | Status: DC
  Administered 2023-08-03 – 2023-08-20 (×17): 88 ug via ORAL
  Filled 2023-08-02 (×19): qty 1

## 2023-08-02 MED ORDER — RANOLAZINE ER 500 MG PO TB12
500.0000 mg | ORAL_TABLET | Freq: Two times a day (BID) | ORAL | Status: DC
Start: 2023-08-02 — End: 2023-08-21
  Administered 2023-08-02 – 2023-08-20 (×36): 500 mg via ORAL
  Filled 2023-08-02 (×38): qty 1

## 2023-08-02 MED ORDER — SODIUM CHLORIDE 0.9% FLUSH
3.0000 mL | Freq: Two times a day (BID) | INTRAVENOUS | Status: DC
  Administered 2023-08-02 – 2023-08-03 (×3): 3 mL via INTRAVENOUS

## 2023-08-02 MED ORDER — ASPIRIN 81 MG PO TBEC
81.0000 mg | DELAYED_RELEASE_TABLET | Freq: Every day | ORAL | Status: DC
  Administered 2023-08-02 – 2023-08-20 (×18): 81 mg via ORAL
  Filled 2023-08-02 (×19): qty 1

## 2023-08-02 MED ORDER — POTASSIUM CHLORIDE 20 MEQ PO PACK
40.0000 meq | PACK | ORAL | Status: AC
Start: 2023-08-02 — End: 2023-08-02
  Administered 2023-08-02: 40 meq via ORAL
  Filled 2023-08-02: qty 2

## 2023-08-02 MED ORDER — MAGNESIUM SULFATE 2 GM/50ML IV SOLN
2.0000 g | Freq: Once | INTRAVENOUS | Status: AC
  Administered 2023-08-02: 2 g via INTRAVENOUS
  Filled 2023-08-02: qty 50

## 2023-08-02 MED ORDER — ISOSORBIDE MONONITRATE ER 60 MG PO TB24
120.0000 mg | ORAL_TABLET | Freq: Two times a day (BID) | ORAL | Status: DC
Start: 2023-08-03 — End: 2023-08-05
  Administered 2023-08-03 – 2023-08-04 (×4): 120 mg via ORAL
  Filled 2023-08-02: qty 4
  Filled 2023-08-02 (×2): qty 2
  Filled 2023-08-02 (×3): qty 4

## 2023-08-02 NOTE — ED Triage Notes (Signed)
Patient to ED via POV for diarrhea. Dx with C diff and hospitalized recently for same. Having abd pain and generalized weakness.

## 2023-08-02 NOTE — Assessment & Plan Note (Addendum)
On admission, Patient is presenting with 10 days of severe diarrhea and fecal incontinence in the setting of recent C. difficile colitis approximately 1 month prior, concerning for treatment failure versus reinfection.  Examination is nonfocal, so will hold off on imaging at this time 08-03-2023 pt's C. Diff PCR and antigen were POSITIVE. Diarrhea was present on admission so this is NOT an HAI. C. Diff toxin negative but given recent bout of c. Diff diarrhea last month that was treated with po vanco, will consider this a recurrence of C. Diff.  Continue with dificid bid x 10 days. Possible home tomorrow if no more diarrhea. 08-04-2023 only 1 diarrheal stool yesterday. PT recs SNF at discharge. Will consult TOC.  Arranged for outpatient [facility] pharmacy to obtain 10 days of dificid for patient. Currently held in inpatient pharmacy for pt to take home. WBC is normal. 08-05-2023 no evidence of dehydration on labs. Add some Questran to help bulk up stools. Continue with dificid. She is medically stable for DC. Awaiting SNF bed. 08-06-2023 still with diarrhea. 3 charts diarrhea stools yesterday. No abd distension or pain. Has been on dificid for 4 days now. On cholestyramine for 24 hours. Considering add po vanco qid to regimen. Po vanco worked last month for her c. Diff infection.  08-07-2023 yesterday, I consulted GI and ID given pt has had lack of improvement in quantity and quality of diarrhea.  What pt is stating and what is observed by nursing staff is drastically different. Nursing staff stating pt still with frequent, liquid and incontinent diarrhea. Pt stating her diarrhea "is getting better".  ID and GI notes reviewed. Remains on Dificid.  Will obtain CT abd/pelvis to further evaluate for colitis.  Pt has had weight loss. Also consultant notes pt with 9 months of diarrhea. GI has offered either inpatient vs outpatient endoscopic evaluation for her weight loss. Pt is not iron deficient per GI consult  note.

## 2023-08-02 NOTE — ED Notes (Signed)
Patient cleaned up after an episode of bowel incontinence. Patient found to have a loose, yellow stool containing mucous. Patient placed in a new brief and placed on a paper pad. Patient's linens completely changed. Bed was returned to lowest position with call light in reach.

## 2023-08-02 NOTE — ED Notes (Signed)
Pt given bagged lunch and beverage

## 2023-08-02 NOTE — ED Notes (Signed)
Pts brief changed  

## 2023-08-02 NOTE — ED Provider Triage Note (Signed)
Emergency Medicine Provider Triage Evaluation Note  Abigail Ortega , a 73 y.o. female  was evaluated in triage.  Pt complains of weakness, fatigue, history of C. difficile, sent by Dr. Johnney Killian office due to patient being fatigued they do not feel that the C. difficile could be managed outpatient at this time.  States she is also anemic with hemoglobin of 7.8 on last visit..  Review of Systems  Positive:  Negative:   Physical Exam  Ht 5\' 5"  (1.651 m)   Wt 58 kg   BMI 21.28 kg/m  Gen:   Awake, no distress   Resp:  Normal effort  MSK:   Moves extremities without difficulty  Other:    Medical Decision Making  Medically screening exam initiated at 10:41 AM.  Appropriate orders placed.  Abigail Ortega was informed that the remainder of the evaluation will be completed by another provider, this initial triage assessment does not replace that evaluation, and the importance of remaining in the ED until their evaluation is complete.     Faythe Ghee, PA-C 08/02/23 1045

## 2023-08-02 NOTE — H&P (Signed)
History and Physical    Patient: Abigail Ortega WFU:[id number] DOB: 1951-02-05 DOA: 08/02/2023 DOS: the patient was seen and examined on 08/02/2023 PCP: Margaretann Loveless, MD  Patient coming from: Home  Chief Complaint:  Chief Complaint  Patient presents with   Diarrhea   HPI: Abigail Ortega is a 73 y.o. female with medical history significant of recent C. difficile colitis s/p treatment with vancomycin (December 2024), CAD s/p DES, breast cancer s/p chemoradiation and mastectomy, hypothyroidism, hypertension, hyperlipidemia, osteoporosis, who presents to the ED due to diarrhea.  [name] states that since she was discharged from SNF approximately 10 days ago, she has been experiencing persistent diarrhea with some generalized abdominal pain.  She denies any nausea or vomiting but notes poor p.o. intake.  She has been experiencing fecal incontinence.  Denies melena or hematochezia, fever/chills.She does endorse shortness of breath with exertion only, but denies any chest pain, palpitations.  She notes that shortness of breath with exertion began after her recent hospitalization, stating she just feels very weak and.  ED course: On arrival to the ED, patient was normotensive at 110/73 with heart rate of 59.  She is saturating at 98% on room air.  She was afebrile at 98.5.  Initial workup notable for hemoglobin of 8.9, platelets 533, potassium 2.6, creatinine 1.13, GFR 51.  Lactic acid within normal limits.  Patient started on IV fluids and potassium supplementation.  TRH contacted for admission.  Review of Systems: As mentioned in the history of present illness. All other systems reviewed and are negative.  Past Medical History:  Diagnosis Date   Allergic rhinitis 12/31/2017   Anemia in chronic kidney disease 06/23/2019   Anemia in chronic kidney disease 06/23/2019   Anxiety    Anxiety disorder due to medical condition 04/02/2018   Arthritis    Breast cancer [facility]) 2014   Right  breast cancer - chemo, radiation and Mastectomy   CHF (congestive heart failure) (HCC) 2013   Chronic kidney disease    Clotting disorder (HCC)    blood clots   Depression    GERD (gastroesophageal reflux disease)    GI bleed    H/O heart artery stent    Headache    Heart attack (HCC) 2012   coronary stent x2 completed by Dr. Juliann Pares.   Heart attack (HCC) 04/29/2013   Hemorrhoids    Hypertension    Hypothyroidism    IBS (irritable bowel syndrome)    Malignant neoplasm of lower-inner quadrant of female breast (HCC) 01/20/2013   invasive mammary cancer, minimal 0.85 cm.  Histologic grade 1.   Persistent insomnia 04/02/2018   Personal history of chemotherapy 2014   BREAST CA   Personal history of radiation therapy 2014   BREAST CA   Secondary hyperparathyroidism of renal origin (HCC) 09/08/2020   Thyroid disease    Past Surgical History:  Procedure Laterality Date   BOTOX INJECTION N/A 03/14/2017   Procedure: BOTOX INJECTION;  Surgeon: Leafy Ro, MD;  Location: [facility] ORS;  Service: General;  Laterality: N/A;   BOTOX INJECTION N/A 05/09/2018   Procedure: BOTOX INJECTION;  Surgeon: Leafy Ro, MD;  Location: [facility] ORS;  Service: General;  Laterality: N/A;   BREAST BIOPSY Right 2014   positive   BREAST SURGERY Right 02-21-2013   right mastectomy   CARDIAC CATHETERIZATION     COLONOSCOPY W/ BIOPSIES  09/15/2010   Colonoscopy completed by Lutricia Feil, M.D. Tubular adenoma of the ascending colon and descending colon reported  up to 0.4 cm in diameter. No atypia.   COLONOSCOPY WITH PROPOFOL N/A 12/23/2016   Procedure: COLONOSCOPY WITH PROPOFOL;  Surgeon: Charlott Rakes, MD;  Location: [facility] ENDOSCOPY;  Service: Endoscopy;  Laterality: N/A;   ESOPHAGOGASTRODUODENOSCOPY N/A 12/13/2016   Procedure: ESOPHAGOGASTRODUODENOSCOPY (EGD);  Surgeon: Wyline Mood, MD;  Location: [facility] ENDOSCOPY;  Service: Endoscopy;  Laterality: N/A;   LEFT HEART CATH N/A 04/02/2020   Procedure: Left Heart Cath  with Coronary Angiography;  Surgeon: Laurier Nancy, MD;  Location[facility] INVASIVE CV LAB;  Service: Cardiovascular;  Laterality: N/A;   LEFT HEART CATH AND CORONARY ANGIOGRAPHY Left 08/29/2018   Procedure: LEFT HEART CATH AND CORONARY ANGIOGRAPHY;  Surgeon: Laurier Nancy, MD;  Location: [facility] INVASIVE CV LAB;  Service: Cardiovascular;  Laterality: Left;   MASTECTOMY Right 2014   BREAST CA   PORTA CATH INSERTION     SPHINCTEROTOMY N/A 05/09/2018   Procedure: SPHINCTEROTOMY;  Surgeon: Leafy Ro, MD;  Location: [facility] ORS;  Service: General;  Laterality: N/A;   UPPER GI ENDOSCOPY  09/15/2010     Completed by Lutricia Feil, M.D. for nausea. Normal exam reported.   VASCULAR SURGERY     Social History:  reports that she quit smoking about 11 years ago. Her smoking use included cigarettes. She started smoking about 31 years ago. She has a 20 pack-year smoking history. She has never used smokeless tobacco. She reports that she does not drink alcohol and does not use drugs.  Allergies  Allergen Reactions   Nausea Control [Emetrol] Nausea And Vomiting   Other     Note: burning, nausea with XR 75 mg   Effexor [Venlafaxine] Hives, Itching and Rash   Hydroxyzine Rash and Hives    Family History  Problem Relation Age of Onset   Breast cancer Mother 58   Cancer Father        colon    Prior to Admission medications   Medication Sig Start Date End Date Taking? Authorizing Provider  acetaminophen (TYLENOL) 650 MG CR tablet Take 1,300 mg by mouth every 8 (eight) hours as needed for pain.    [provider]  aspirin EC 81 MG tablet Take 81 mg by mouth daily. 08/08/18   [provider]  atorvastatin (LIPITOR) 80 MG tablet TAKE 1 TABLET EVERY DAY 02/12/23   Laurier Nancy, MD  clopidogrel (PLAVIX) 75 MG tablet TAKE 1 TABLET EVERY DAY (NEED MD APPOINTMENT) 02/12/23   Laurier Nancy, MD  dicyclomine (BENTYL) 10 MG capsule TAKE 1 CAPSULE EVERY 8 HOURS FOR ABDOMINAL PAIN 11/06/22   Orson Eva, NP  ferrous sulfate 325 (65 FE) MG tablet Take 1 tablet (325 mg total) by mouth daily. 11/28/22 11/28/23  Orson Eva, NP  fexofenadine (ALLEGRA) 60 MG tablet Take 0.5 tablets (30 mg total) by mouth 2 (two) times daily as needed (allergies.). 05/24/23   Scoggins, Amber, NP  furosemide (LASIX) 20 MG tablet Take 1 tablet (20 mg total) by mouth daily as needed. 05/24/23   Scoggins, Hospital doctor, NP  hydrocortisone 2.5 % cream APPLY PEA SIZED TO RECTAL REGION EVERY 8 HOURS AS NEEDED FOR RELIEF 11/06/22   Orson Eva, NP  isosorbide mononitrate (IMDUR) 120 MG 24 hr tablet TAKE 1 TABLET TWICE DAILY 02/12/23   Laurier Nancy, MD  levothyroxine (SYNTHROID) 88 MCG tablet Take 1 tablet (88 mcg total) by mouth daily before breakfast. 05/24/23   Scoggins, Amber, NP  lidocaine (LIDODERM) 5 % Place 1 patch onto the skin every 12 (  twelve) hours. Remove & Discard patch within 12 hours or as directed by MD 04/20/23 04/19/24  Cruz Condon A, PA-C  midodrine (PROAMATINE) 10 MG tablet Take 10 mg by mouth 3 (three) times daily. 04/27/23   [provider]  montelukast (SINGULAIR) 10 MG tablet TAKE 1 TABLET AT BEDTIME 01/17/23   Miki Kins, FNP  ondansetron (ZOFRAN-ODT) 4 MG disintegrating tablet Take 1 tablet (4 mg total) by mouth every 6 (six) hours as needed for nausea or vomiting. 05/22/23   Ward, Layla Maw, DO  pantoprazole (PROTONIX) 40 MG tablet Take 1 tablet (40 mg total) by mouth daily. 05/22/23 05/21/24  Ward, Layla Maw, DO  ranolazine (RANEXA) 500 MG 12 hr tablet TAKE 1 TABLET TWICE DAILY 01/17/23   Laurier Nancy, MD  traZODone (DESYREL) 100 MG tablet Take 1 tablet (100 mg total) by mouth at bedtime. TAKE 1 TABLET BY MOUTH NIGHTLY AT BEDTIME AS NEEDED FOR SLEEP 05/24/23   Marisue Ivan, NP    Physical Exam: Vitals:   08/02/23 1039 08/02/23 1041 08/02/23 1230 08/02/23 1507  BP:  110/73 135/69   Pulse:  (!) 59 68   Resp:  18 13   Temp:  98.5 F (36.9 C)  98.1 F (36.7 C)  TempSrc:  Oral   Oral  SpO2:  98% 100%   Weight: 58 kg     Height: 5\' 5"  (1.651 m)      Physical Exam Vitals and nursing note reviewed.  Constitutional:      Appearance: She is underweight.  HENT:     Head: Normocephalic and atraumatic.     Mouth/Throat:     Mouth: Mucous membranes are dry.  Cardiovascular:     Rate and Rhythm: Normal rate and regular rhythm.     Heart sounds: No murmur heard. Pulmonary:     Effort: Pulmonary effort is normal. No respiratory distress.     Breath sounds: Normal breath sounds. No wheezing or rales.  Abdominal:     General: There is no distension.     Palpations: Abdomen is soft.     Tenderness: There is no abdominal tenderness.  Musculoskeletal:     Comments: Chronic wrist fracture noted, left  Skin:    General: Skin is warm and dry.  Neurological:     Mental Status: She is alert.     Comments:  Patient is alert and oriented to person, place and situation No focal weakness noted  Psychiatric:        Mood and Affect: Mood normal.        Behavior: Behavior normal.    Data Reviewed: CBC with WBC of 7.3, hemoglobin of 8.9, platelets of 533 CMP with sodium of 135, potassium 2.6, bicarb 22, creatinine 1.13, anion gap 18, albumin 2.9, GFR 51 Lactic acid 1.6  Chest x-ray with no active disease  EKG personally reviewed.  Sinus rhythm with rate of 70.  GI panel pending C. difficile antigen positive, toxin negative.  PCR pending  Results are pending, will review when available.  Assessment and Plan:  * Acute colitis Patient is presenting with 10 days of severe diarrhea and fecal incontinence in the setting of recent C. difficile colitis approximately 1 month prior, concerning for treatment failure versus reinfection.  Examination is nonfocal, so will hold off on imaging at this time  - GI panel and C. difficile PCR pending - S/p 1 L bolus - Push oral intake increase  Hypokalemia In the setting of diarrhea.  EKG demonstrating  QTc prolongation.  -  Telemetry monitoring - S/p 60 mEq replacement - Pharmacy consulted for electrolyte management - Magnesium pending  Chronic hypotension Initial concern at GI office earlier today for hypotension, however patient's blood pressure has been within normal limits since arrival to the ED.  - Continue home midodrine  Severe hypothyroidism On most recent admission in December 2024, TSH was noted to be markedly elevated at 270.  She was treated with IV Synthroid initially and transition to p.o.  - Repeat TSH pending  CAD (coronary artery disease) Patient reports shortness of breath on exertion but denies any chest pain with exertion.  This is likely due to deconditioning after prolonged hospitalization and rehabilitation over the last month.   - Continue home regimen  Chronic combined systolic and diastolic CHF (congestive heart failure) (HCC) Patient appears hypovolemic on examination in the setting of diarrhea  - Hold home Lasix - Daily weights  Advance Care Planning:   Code Status: Do not attempt resuscitation (DNR) PRE-ARREST INTERVENTIONS DESIRED   Consults: None  Family Communication: Patient's granddaughter updated at bedside  Severity of Illness: The appropriate patient status for this patient is OBSERVATION. Observation status is judged to be reasonable and necessary in order to provide the required intensity of service to ensure the patient's safety. The patient's presenting symptoms, physical exam findings, and initial radiographic and laboratory data in the context of their medical condition is felt to place them at decreased risk for further clinical deterioration. Furthermore, it is anticipated that the patient will be medically stable for discharge from the hospital within 2 midnights of admission.   Author: Verdene Lennert, MD 08/02/2023 3:44 PM  For on call review [email or web address].

## 2023-08-02 NOTE — Progress Notes (Signed)
PHARMACY CONSULT NOTE - FOLLOW UP  Pharmacy Consult for Electrolyte Monitoring and Replacement   Recent Labs: Potassium (mmol/L)  Date Value  08/02/2023 2.6 (LL)  03/20/2014 3.4 (L)   Magnesium (mg/dL)  Date Value  40/98/1191 2.0  02/23/2014 1.7 (L)   Calcium (mg/dL)  Date Value  47/82/9562 8.1 (L)   Calcium, Total (mg/dL)  Date Value  13/02/6577 9.6   Albumin (g/dL)  Date Value  46/96/2952 2.9 (L)  11/24/2022 4.3  03/20/2014 3.4   Phosphorus (mg/dL)  Date Value  84/13/2440 2.5   Sodium (mmol/L)  Date Value  08/02/2023 135  11/24/2022 137  03/20/2014 135 (L)   Diet: NA Fluids: NA Pertinent Medications: NA  Assessment: PM is a 73 yo female who presented for follow-up due to hx of C. Difficile. They have had sever diarrhea, weight-loss and weakness. They arrived hypotensive and confused today. Pharmacy has been consulted to manage this patient's electrolytes.   Goal of Therapy:  Electrolytes WNL K = 2.6 Mg = 1.7 Phos = NA  Plan:  MD replaced potassium 40 mEq KCL pO; 10 mEq Kcl IV x 2 Also replaced with magnesium sulfate 2g IV x 1 Will recheck BMP, Mg and Phos with AM labs  Effie Shy, PharmD Pharmacy Resident  08/02/2023 3:15 PM

## 2023-08-02 NOTE — Assessment & Plan Note (Addendum)
On admission, Patient appears hypovolemic on examination in the setting of diarrhea.  Hold home Lasix.  Daily weights 08-03-2023 appears euvolemic. Consider restarting lasix tomorrow. 08-04-2023 pt still having diarrhea. Continue to hold lasix. 08-05-2023 pt continues to c/o of diarrhea. Continue to hold lasix. 08-06-2023 euvolemic.  08-07-2023 continue to hold lasix. Pt is euvolemic.

## 2023-08-02 NOTE — Assessment & Plan Note (Addendum)
On admission, Patient reports shortness of breath on exertion but denies any chest pain with exertion.  This is likely due to deconditioning after prolonged hospitalization and rehabilitation over the last month. Continue home regimen 08-03-2023 no further SOB. 08-04-2023 stable 08-05-2023 stable 08-06-2023 stable  08-07-2023 stable.

## 2023-08-02 NOTE — Assessment & Plan Note (Addendum)
On admission, In the setting of diarrhea.  EKG demonstrating QTc prolongation. S/p 60 meq replacement. 08-03-2023 K still 2.8. will give Kphos 08-04-2023 give more po kcl. Repeat EKG to document shortening of Qtc 08-05-2023 awaiting EKG that was ordered yesterday to be transmitted. 08-06-2023 stable  08-07-2023 continue with prn potassium replacement.

## 2023-08-02 NOTE — Assessment & Plan Note (Addendum)
On admission, Initial concern at GI office earlier today for hypotension, however patient's blood pressure has been within normal limits since arrival to the ED. Continue home midodrine 08-03-2023 BP improved with IVF. Continue midodrine. 08-04-2023 stable. Continue home midodrine. 08-05-2023 stable 08-06-2023 stable  08-07-2023 BP is stable.

## 2023-08-02 NOTE — ED Notes (Signed)
Pt's brief changed and warm blankets given.

## 2023-08-02 NOTE — Assessment & Plan Note (Addendum)
On most recent admission in December 2024, TSH was noted to be markedly elevated at 270.  She was treated with IV Synthroid initially and transition to p.o. 08-03-2023 TSH 89.9. improved from 270. Continue at same dose of synthroid. 08-04-2023 stable 08-05-2023 stable 08-06-2023 stable  08-07-2023 stable. Repeat TSH and FT4 in 6 weeks as outpatient.

## 2023-08-02 NOTE — ED Provider Notes (Signed)
[facility] Provider Note    Event Date/Time   First MD Initiated Contact with Patient 08/02/23 1145     (approximate)   History   Chief Complaint: Diarrhea   HPI  Abigail Ortega is a 73 y.o. female with a history of CHF, GI bleed, hypertension, GERD, CKD, hypothyroidism, C. difficile colitis who is sent to the ED today from gastroenterology clinic due to hypotension, generalized weakness, recurrent diarrhea.  Reviewed outside records, patient was recently admitted to the hospital on June 27, 2023, discharged on July 09, 2023 after being treated for C. difficile colitis.  She was discharged to a skilled nursing facility for 2 weeks during which diarrhea and condition continued to improve.  Son reports that patient was discharged from skilled nursing facility around January 6, and at that point she had decreased appetite, recurrent diarrhea, worsening progressive generalized weakness.  Patient denies pain or fever.  No cough or shortness of breath.  She does report feeling confused and son corroborates that patient has moments of disorientation over the past week or 2.          Physical Exam   Triage Vital Signs: ED Triage Vitals  Encounter Vitals Group     BP 08/02/23 1041 110/73     Systolic BP Percentile --      Diastolic BP Percentile --      Pulse Rate 08/02/23 1041 (!) 59     Resp 08/02/23 1041 18     Temp 08/02/23 1041 98.5 F (36.9 C)     Temp Source 08/02/23 1041 Oral     SpO2 08/02/23 1041 98 %     Weight 08/02/23 1039 127 lb 13.9 oz (58 kg)     Height 08/02/23 1039 5\' 5"  (1.651 m)     Head Circumference --      Peak Flow --      Pain Score 08/02/23 1039 0     Pain Loc --      Pain Education --      Exclude from Growth Chart --     Most recent vital signs: Vitals:   08/02/23 1041 08/02/23 1230  BP: 110/73 135/69  Pulse: (!) 59 68  Resp: 18 13  Temp: 98.5 F (36.9 C)   SpO2: 98% 100%    General: Awake, no  distress.  CV:  Good peripheral perfusion.  Regular rate rhythm Resp:  Normal effort.  Clear to auscultation bilaterally Abd:  No distention.  Soft nontender Other:  Dry oral mucosa   ED Results / Procedures / Treatments   Labs (all labs ordered are listed, but only abnormal results are displayed) Labs Reviewed  COMPREHENSIVE METABOLIC PANEL - Abnormal; Notable for the following components:      Result Value   Potassium 2.6 (*)    Chloride 95 (*)    Creatinine, Ser 1.13 (*)    Calcium 8.1 (*)    Total Protein 6.3 (*)    Albumin 2.9 (*)    Total Bilirubin 1.4 (*)    GFR, Estimated 51 (*)    Anion gap 18 (*)    All other components within normal limits  CBC WITH DIFFERENTIAL/PLATELET - Abnormal; Notable for the following components:   RBC 2.84 (*)    Hemoglobin 8.9 (*)    HCT 27.5 (*)    Platelets 533 (*)    All other components within normal limits  CULTURE, BLOOD (ROUTINE X 2)  CULTURE, BLOOD (ROUTINE X 2)  C DIFFICILE QUICK SCREEN W PCR REFLEX    GASTROINTESTINAL PANEL BY PCR, STOOL (REPLACES STOOL CULTURE)  LACTIC ACID, PLASMA  PROTIME-INR  URINALYSIS, W/ REFLEX TO CULTURE (INFECTION SUSPECTED)  TYPE AND SCREEN     EKG  Interpreted by me rate of 70.  Left axis, prolonged QTc of 537 ms.  Normal QRS ST segments and T waves.   RADIOLOGY Chest x-ray interpreted by me, unremarkable.  Radiology report reviewed   PROCEDURES:  Procedures   MEDICATIONS ORDERED IN ED: Medications  potassium chloride 10 mEq in 100 mL IVPB (10 mEq Intravenous New Bag/Given 08/02/23 1345)  sodium chloride 0.9 % bolus 1,000 mL (0 mLs Intravenous Stopped 08/02/23 1345)  potassium chloride (KLOR-CON) packet 40 mEq (40 mEq Oral Given 08/02/23 1213)     IMPRESSION / MDM / ASSESSMENT AND PLAN / ED COURSE  I reviewed the triage vital signs and the nursing notes.  DDx: C. difficile colitis, electrolyte derangement, dehydration, GI bleed, anemia, UTI  Patient's presentation is most  consistent with acute presentation with potential threat to life or bodily function.  Patient presents with generalized weakness, confusion, recurrent diarrhea after recent C. difficile treatment.  Found to have potassium of 2.6.  CBC consistent with stable chronic anemia.  Will start IV fluids, IV potassium, oral potassium as tolerated, obtain stool studies       FINAL CLINICAL IMPRESSION(S) / ED DIAGNOSES   Final diagnoses:  Hypokalemia  Diarrhea of presumed infectious origin  Prolonged Q-T interval on ECG     Rx / DC Orders   ED Discharge Orders     None        Note:  This document was prepared using Dragon voice recognition software and may include unintentional dictation errors.   Sharman Cheek, MD 08/02/23 1357

## 2023-08-03 ENCOUNTER — Other Ambulatory Visit (HOSPITAL_COMMUNITY): Payer: Self-pay

## 2023-08-03 ENCOUNTER — Telehealth (HOSPITAL_COMMUNITY): Payer: Self-pay | Admitting: Pharmacy Technician

## 2023-08-03 ENCOUNTER — Other Ambulatory Visit: Payer: Self-pay

## 2023-08-03 DIAGNOSIS — I130 Hypertensive heart and chronic kidney disease with heart failure and stage 1 through stage 4 chronic kidney disease, or unspecified chronic kidney disease: Secondary | ICD-10-CM | POA: Diagnosis present

## 2023-08-03 DIAGNOSIS — R197 Diarrhea, unspecified: Secondary | ICD-10-CM | POA: Diagnosis present

## 2023-08-03 DIAGNOSIS — D62 Acute posthemorrhagic anemia: Secondary | ICD-10-CM | POA: Diagnosis not present

## 2023-08-03 DIAGNOSIS — R918 Other nonspecific abnormal finding of lung field: Secondary | ICD-10-CM | POA: Diagnosis not present

## 2023-08-03 DIAGNOSIS — K591 Functional diarrhea: Secondary | ICD-10-CM | POA: Diagnosis not present

## 2023-08-03 DIAGNOSIS — K529 Noninfective gastroenteritis and colitis, unspecified: Secondary | ICD-10-CM | POA: Diagnosis not present

## 2023-08-03 DIAGNOSIS — E039 Hypothyroidism, unspecified: Secondary | ICD-10-CM | POA: Diagnosis present

## 2023-08-03 DIAGNOSIS — Z9011 Acquired absence of right breast and nipple: Secondary | ICD-10-CM | POA: Diagnosis not present

## 2023-08-03 DIAGNOSIS — D649 Anemia, unspecified: Secondary | ICD-10-CM | POA: Diagnosis not present

## 2023-08-03 DIAGNOSIS — K5730 Diverticulosis of large intestine without perforation or abscess without bleeding: Secondary | ICD-10-CM | POA: Diagnosis not present

## 2023-08-03 DIAGNOSIS — N2581 Secondary hyperparathyroidism of renal origin: Secondary | ICD-10-CM | POA: Diagnosis present

## 2023-08-03 DIAGNOSIS — Z87891 Personal history of nicotine dependence: Secondary | ICD-10-CM | POA: Diagnosis not present

## 2023-08-03 DIAGNOSIS — I252 Old myocardial infarction: Secondary | ICD-10-CM | POA: Diagnosis not present

## 2023-08-03 DIAGNOSIS — D631 Anemia in chronic kidney disease: Secondary | ICD-10-CM | POA: Diagnosis present

## 2023-08-03 DIAGNOSIS — Z9221 Personal history of antineoplastic chemotherapy: Secondary | ICD-10-CM | POA: Diagnosis not present

## 2023-08-03 DIAGNOSIS — K91840 Postprocedural hemorrhage and hematoma of a digestive system organ or structure following a digestive system procedure: Secondary | ICD-10-CM | POA: Diagnosis not present

## 2023-08-03 DIAGNOSIS — E538 Deficiency of other specified B group vitamins: Secondary | ICD-10-CM | POA: Diagnosis not present

## 2023-08-03 DIAGNOSIS — I25118 Atherosclerotic heart disease of native coronary artery with other forms of angina pectoris: Secondary | ICD-10-CM | POA: Diagnosis not present

## 2023-08-03 DIAGNOSIS — R634 Abnormal weight loss: Secondary | ICD-10-CM | POA: Diagnosis not present

## 2023-08-03 DIAGNOSIS — K449 Diaphragmatic hernia without obstruction or gangrene: Secondary | ICD-10-CM | POA: Diagnosis not present

## 2023-08-03 DIAGNOSIS — Z7982 Long term (current) use of aspirin: Secondary | ICD-10-CM | POA: Diagnosis not present

## 2023-08-03 DIAGNOSIS — E861 Hypovolemia: Secondary | ICD-10-CM | POA: Diagnosis present

## 2023-08-03 DIAGNOSIS — A0472 Enterocolitis due to Clostridium difficile, not specified as recurrent: Secondary | ICD-10-CM | POA: Diagnosis not present

## 2023-08-03 DIAGNOSIS — N189 Chronic kidney disease, unspecified: Secondary | ICD-10-CM | POA: Diagnosis present

## 2023-08-03 DIAGNOSIS — Z955 Presence of coronary angioplasty implant and graft: Secondary | ICD-10-CM | POA: Diagnosis not present

## 2023-08-03 DIAGNOSIS — Z923 Personal history of irradiation: Secondary | ICD-10-CM | POA: Diagnosis not present

## 2023-08-03 DIAGNOSIS — I5043 Acute on chronic combined systolic (congestive) and diastolic (congestive) heart failure: Secondary | ICD-10-CM | POA: Diagnosis not present

## 2023-08-03 DIAGNOSIS — I9589 Other hypotension: Secondary | ICD-10-CM | POA: Diagnosis not present

## 2023-08-03 DIAGNOSIS — I5042 Chronic combined systolic (congestive) and diastolic (congestive) heart failure: Secondary | ICD-10-CM | POA: Diagnosis present

## 2023-08-03 DIAGNOSIS — K209 Esophagitis, unspecified without bleeding: Secondary | ICD-10-CM | POA: Diagnosis not present

## 2023-08-03 DIAGNOSIS — K580 Irritable bowel syndrome with diarrhea: Secondary | ICD-10-CM | POA: Diagnosis present

## 2023-08-03 DIAGNOSIS — E876 Hypokalemia: Secondary | ICD-10-CM | POA: Diagnosis present

## 2023-08-03 DIAGNOSIS — Z7902 Long term (current) use of antithrombotics/antiplatelets: Secondary | ICD-10-CM | POA: Diagnosis not present

## 2023-08-03 DIAGNOSIS — I2510 Atherosclerotic heart disease of native coronary artery without angina pectoris: Secondary | ICD-10-CM | POA: Diagnosis present

## 2023-08-03 DIAGNOSIS — E785 Hyperlipidemia, unspecified: Secondary | ICD-10-CM | POA: Diagnosis present

## 2023-08-03 DIAGNOSIS — M810 Age-related osteoporosis without current pathological fracture: Secondary | ICD-10-CM | POA: Diagnosis present

## 2023-08-03 DIAGNOSIS — Y733 Surgical instruments, materials and gastroenterology and urology devices (including sutures) associated with adverse incidents: Secondary | ICD-10-CM | POA: Diagnosis not present

## 2023-08-03 DIAGNOSIS — Y838 Other surgical procedures as the cause of abnormal reaction of the patient, or of later complication, without mention of misadventure at the time of the procedure: Secondary | ICD-10-CM | POA: Diagnosis not present

## 2023-08-03 DIAGNOSIS — Z66 Do not resuscitate: Secondary | ICD-10-CM | POA: Diagnosis present

## 2023-08-03 LAB — PHOSPHORUS: Phosphorus: 1.1 mg/dL — ABNORMAL LOW (ref 2.5–4.6)

## 2023-08-03 LAB — CBC
HCT: 23.4 % — ABNORMAL LOW (ref 36.0–46.0)
Hemoglobin: 8 g/dL — ABNORMAL LOW (ref 12.0–15.0)
MCH: 31.7 pg (ref 26.0–34.0)
MCHC: 34.2 g/dL (ref 30.0–36.0)
MCV: 92.9 fL (ref 80.0–100.0)
Platelets: 483 10*3/uL — ABNORMAL HIGH (ref 150–400)
RBC: 2.52 MIL/uL — ABNORMAL LOW (ref 3.87–5.11)
RDW: 15.4 % (ref 11.5–15.5)
WBC: 5.8 10*3/uL (ref 4.0–10.5)
nRBC: 0 % (ref 0.0–0.2)

## 2023-08-03 LAB — BASIC METABOLIC PANEL
Anion gap: 9 (ref 5–15)
BUN: 9 mg/dL (ref 8–23)
CO2: 25 mmol/L (ref 22–32)
Calcium: 7.5 mg/dL — ABNORMAL LOW (ref 8.9–10.3)
Chloride: 101 mmol/L (ref 98–111)
Creatinine, Ser: 0.84 mg/dL (ref 0.44–1.00)
GFR, Estimated: >60 mL/min (ref >60)
Glucose, Bld: 97 mg/dL (ref 70–99)
Potassium: 2.8 mmol/L — ABNORMAL LOW (ref 3.5–5.1)
Sodium: 135 mmol/L (ref 135–145)

## 2023-08-03 LAB — MAGNESIUM: Magnesium: 2.1 mg/dL (ref 1.7–2.4)

## 2023-08-03 MED ORDER — POTASSIUM CHLORIDE 20 MEQ PO PACK
40.0000 meq | PACK | ORAL | Status: AC
  Administered 2023-08-03: 40 meq via ORAL
  Filled 2023-08-03: qty 2

## 2023-08-03 MED ORDER — POTASSIUM PHOSPHATES 15 MMOLE/5ML IV SOLN: 30.0000 mmol | Freq: Once | INTRAVENOUS | Status: DC

## 2023-08-03 MED ORDER — INFLUENZA VAC A&B SURF ANT ADJ 0.5 ML IM SUSY
0.5000 mL | PREFILLED_SYRINGE | INTRAMUSCULAR | Status: DC
  Filled 2023-08-03: qty 0.5

## 2023-08-03 MED ORDER — POTASSIUM PHOSPHATES 15 MMOLE/5ML IV SOLN
30.0000 mmol | Freq: Once | INTRAVENOUS | Status: AC
  Administered 2023-08-03: 30 mmol via INTRAVENOUS
  Filled 2023-08-03: qty 10

## 2023-08-03 MED ORDER — DIFICID 200 MG PO TABS
200.0000 mg | ORAL_TABLET | Freq: Two times a day (BID) | ORAL | 0 refills | Status: DC
  Filled 2023-08-03: qty 20, 10d supply, fill #0

## 2023-08-03 MED ORDER — POTASSIUM CHLORIDE 10 MEQ/100ML IV SOLN
10.0000 meq | INTRAVENOUS | Status: AC
  Administered 2023-08-03 (×2): 10 meq via INTRAVENOUS
  Filled 2023-08-03 (×2): qty 100

## 2023-08-03 NOTE — Progress Notes (Signed)
PHARMACY CONSULT NOTE - FOLLOW UP  Pharmacy Consult for Electrolyte Monitoring and Replacement   Recent Labs: Potassium (mmol/L)  Date Value  08/03/2023 2.8 (L)  03/20/2014 3.4 (L)   Magnesium (mg/dL)  Date Value  41/66/0630 2.1  02/23/2014 1.7 (L)   Calcium (mg/dL)  Date Value  16/07/930 7.5 (L)   Calcium, Total (mg/dL)  Date Value  35/57/3220 9.6   Albumin (g/dL)  Date Value  25/42/7062 2.9 (L)  11/24/2022 4.3  03/20/2014 3.4   Phosphorus (mg/dL)  Date Value  37/62/8315 1.1 (L)   Sodium (mmol/L)  Date Value  08/03/2023 135  11/24/2022 137  03/20/2014 135 (L)   Corrected Ca: 8.4     (Ca 7.5, Albumin 2.9)  Diet: reg Fluids: none Pertinent Medications: fidaxomicin (Cdiff diarrhea)  Assessment: PM is a 73 yo female who presented for follow-up due to hx of C. Difficile. They have had severe diarrhea, weight-loss and weakness. They arrived hypotensive and confused today. Pharmacy has been consulted to manage this patient's electrolytes.   Goal of Therapy:  Electrolytes WNL   Plan:  K 2.8     Scr 0.84    Will order 40 mEq KCL po packets x1 and 10 mEq Kcl IV x 2 Phos 1.1     Will order KPhos 30 mmol IV x1 (also provides 44 meq Potassium over 6 hrs) Will recheck BMP, Mg and Phos with AM labs  Amberlyn Martinezgarcia A, PharmD 08/03/2023 8:03 AM

## 2023-08-03 NOTE — ED Notes (Signed)
Patient complained of pain at her iv site. Pt informed this RN that she had right sided breast cancer at one time. IV removed by this RN. This RN made 3 attempts to gain IV access in left arm. Mardella Layman, Rn to make an attempt.

## 2023-08-03 NOTE — ED Notes (Signed)
Pts brief changed  

## 2023-08-03 NOTE — Telephone Encounter (Signed)
Patient Product/process development scientist completed.    The patient is insured through [employer]. Patient has Medicare and is not eligible for a copay card, but may be able to apply for patient assistance or Medicare RX Payment Plan (Patient Must reach out to their plan, if eligible for payment plan), if available.    Ran test claim for Dificid 200 mg and the current 10 day co-pay is $12.15.   This test claim was processed through [facility]- copay amounts may vary at other pharmacies due to pharmacy/plan contracts, or as the patient moves through the different stages of their insurance plan.     Roland Earl, CPHT Pharmacy Technician III Certified Patient Advocate [facility] Patient Advocate Team Direct Number: [phone]  Fax: [phone]

## 2023-08-03 NOTE — Progress Notes (Signed)
PROGRESS NOTE    Abigail Ortega  WNU:[id number] DOB: [birth year] DOA: 08/02/2023 PCP: Margaretann Loveless, MD  Subjective: Pt seen and examined. Met with pt and grand-dtr at bedside. Pt developed diarrhea about 5 days after returning home from SNF.  Was admitted last month for c. Diff. Treated with po vanco.  Started on dificid.  Found out from pharmacy that course of dificid copay is $12 for patient.  No further diarrhea since last night.  WBC 5.8 today. No abd pain.  Eating breakfast. No nausea.   Hospital Course: HPI: Abigail Ortega is a 73 y.o. female with medical history significant of recent C. difficile colitis s/p treatment with vancomycin (December 2024), CAD s/p DES, breast cancer s/p chemoradiation and mastectomy, hypothyroidism, hypertension, hyperlipidemia, osteoporosis, who presents to the ED due to diarrhea.   [name] states that since she was discharged from SNF approximately 10 days ago, she has been experiencing persistent diarrhea with some generalized abdominal pain.  She denies any nausea or vomiting but notes poor p.o. intake.  She has been experiencing fecal incontinence.  Denies melena or hematochezia, fever/chills.She does endorse shortness of breath with exertion only, but denies any chest pain, palpitations.  She notes that shortness of breath with exertion began after her recent hospitalization, stating she just feels very weak and.   ED course: On arrival to the ED, patient was normotensive at 110/73 with heart rate of 59.  She is saturating at 98% on room air.  She was afebrile at 98.5.  Initial workup notable for hemoglobin of 8.9, platelets 533, potassium 2.6, creatinine 1.13, GFR 51.  Lactic acid within normal limits.  Patient started on IV fluids and potassium supplementation.  [facility] contacted for admission.  Significant Events: Admitted 08/02/2023 with acute colitis, rule out recurrent C. diff   Significant Labs: hemoglobin of 8.9,  platelets 533, potassium 2.6, creatinine 1.13, GFR 51. Lactic acid within normal limits   Significant Imaging Studies: CXR No acute cardiopulmonary findings.   Antibiotic Therapy: Anti-infectives (From admission, onward)    Start     Dose/Rate Route Frequency Ordered Stop   08/02/23 2245  fidaxomicin (DIFICID) tablet 200 mg        200 mg Oral 2 times daily 08/02/23 2242 08/12/23 2159       Procedures:   Consultants:     Assessment and Plan: * C. difficile colitis On admission, Patient is presenting with 10 days of severe diarrhea and fecal incontinence in the setting of recent C. difficile colitis approximately 1 month prior, concerning for treatment failure versus reinfection.  Examination is nonfocal, so will hold off on imaging at this time  08-03-2023 pt's C. Diff PCR and antigen were POSITIVE. Diarrhea was present on admission so this is NOT an HAI. C. Diff toxin negative but given recent bout of c. Diff diarrhea last month that was treated with po vanco, will consider this a recurrence of C. Diff.  Continue with dificid bid x 10 days. Possible home tomorrow if no more diarrhea.   Hypokalemia On admission, In the setting of diarrhea.  EKG demonstrating QTc prolongation. S/p 60 meq replacement.  08-03-2023 K still 2.8. will give Kphos  Severe hypothyroidism On most recent admission in December 2024, TSH was noted to be markedly elevated at 270.  She was treated with IV Synthroid initially and transition to p.o.  08-03-2023 TSH 89.9. improved from 270. Continue at same dose of synthroid.  Chronic hypotension On admission, Initial concern at GI  office earlier today for hypotension, however patient's blood pressure has been within normal limits since arrival to the ED. Continue home midodrine  08-03-2023 BP improved with IVF. Continue midodrine.  CAD (coronary artery disease) On admission, Patient reports shortness of breath on exertion but denies any chest pain with  exertion.  This is likely due to deconditioning after prolonged hospitalization and rehabilitation over the last month. Continue home regimen  08-03-2023 no further SOB.  Chronic combined systolic and diastolic CHF (congestive heart failure) (HCC) On admission, Patient appears hypovolemic on examination in the setting of diarrhea.  Hold home Lasix.  Daily weights  08-03-2023 appears euvolemic. Consider restarting lasix tomorrow.       DVT prophylaxis: enoxaparin (LOVENOX) injection 40 mg Start: 08/02/23 2200     Code Status: Do not attempt resuscitation (DNR) PRE-ARREST INTERVENTIONS DESIRED Family Communication: discussed with pt and grand dtr at bedside Disposition Plan: return home Reason for continuing need for hospitalization: monitor on po dificid.  Objective: Vitals:   08/03/23 0500 08/03/23 0536 08/03/23 0630 08/03/23 0700  BP: 115/80  123/62 110/63  Pulse: 82  (!) 37 74  Resp: 16  13 16   Temp:  98 F (36.7 C)    TempSrc:  Oral    SpO2: 99%  100% 97%  Weight:      Height:        Intake/Output Summary (Last 24 hours) at 08/03/2023 1149 Last data filed at 08/02/2023 2125 Gross per 24 hour  Intake 1256 ml  Output --  Net 1256 ml   Filed Weights   08/02/23 1039  Weight: 58 kg    Examination:  Physical Exam Vitals and nursing note reviewed.  Constitutional:      General: She is not in acute distress.    Appearance: Normal appearance. She is not ill-appearing or toxic-appearing.  HENT:     Head: Normocephalic and atraumatic.     Nose: Nose normal.  Cardiovascular:     Rate and Rhythm: Normal rate and regular rhythm.  Pulmonary:     Effort: Pulmonary effort is normal.     Breath sounds: Normal breath sounds.  Abdominal:     General: Bowel sounds are normal. There is no distension.     Palpations: Abdomen is soft.  Musculoskeletal:     Right lower leg: No edema.     Left lower leg: No edema.  Skin:    General: Skin is warm and dry.     Capillary  Refill: Capillary refill takes less than 2 seconds.  Neurological:     Mental Status: She is alert and oriented to person, place, and time.     Data Reviewed: I have personally reviewed following labs and imaging studies  CBC: Recent Labs  Lab 08/02/23 1042 08/03/23 0537  WBC 7.3 5.8  NEUTROABS 5.3  --   HGB 8.9* 8.0*  HCT 27.5* 23.4*  MCV 96.8 92.9  PLT 533* 483*   Basic Metabolic Panel: Recent Labs  Lab 08/02/23 1042 08/03/23 0537  NA 135 135  K 2.6* 2.8*  CL 95* 101  CO2 22 25  GLUCOSE 80 97  BUN 13 9  CREATININE 1.13* 0.84  CALCIUM 8.1* 7.5*  MG 1.7 2.1  PHOS  --  1.1*   GFR: Estimated Creatinine Clearance: 53.7 mL/min (by C-G formula based on SCr of 0.84 mg/dL). Liver Function Tests: Recent Labs  Lab 08/02/23 1042  AST 21  ALT 10  ALKPHOS 106  BILITOT 1.4*  PROT 6.3*  ALBUMIN 2.9*   Coagulation Profile: Recent Labs  Lab 08/02/23 1042  INR 1.0   Thyroid Function Tests: Recent Labs    08/02/23 1042  TSH 89.000*   Sepsis Labs: Recent Labs  Lab 08/02/23 1042  LATICACIDVEN 1.6    Recent Results (from the past 240 hours)  Culture, blood (Routine x 2)     Status: None (Preliminary result)   Collection Time: 08/02/23 10:42 AM   Specimen: BLOOD  Result Value Ref Range Status   Specimen Description BLOOD RIGHT [facility]  Final   Special Requests   Final    BOTTLES DRAWN AEROBIC AND ANAEROBIC Blood Culture adequate volume   Culture   Final    NO GROWTH < 24 HOURS Performed at [facility], [address] [place], [place] [address]    Report Status PENDING  Incomplete  Culture, blood (Routine x 2)     Status: None (Preliminary result)   Collection Time: 08/02/23 11:54 AM   Specimen: Right Antecubital; Blood  Result Value Ref Range Status   Specimen Description RIGHT ANTECUBITAL  Final   Special Requests   Final    BOTTLES DRAWN AEROBIC AND ANAEROBIC Blood Culture results may not be optimal due to an inadequate volume of blood  received in culture bottles   Culture   Final    NO GROWTH < 24 HOURS Performed at [facility], [address] [place], [place] [address]    Report Status PENDING  Incomplete  C Difficile Quick Screen w PCR reflex     Status: Abnormal   Collection Time: 08/02/23  2:05 PM  Result Value Ref Range Status   C Diff antigen POSITIVE (A) NEGATIVE Final   C Diff toxin NEGATIVE NEGATIVE Final   C Diff interpretation Results are indeterminate. See PCR results.  Final    Comment: Performed at [facility], [address] [place], [place] [address]  Gastrointestinal Panel by PCR , Stool     Status: None   Collection Time: 08/02/23  2:05 PM  Result Value Ref Range Status   Campylobacter species NOT DETECTED NOT DETECTED Final   Plesimonas shigelloides NOT DETECTED NOT DETECTED Final   Salmonella species NOT DETECTED NOT DETECTED Final   Yersinia enterocolitica NOT DETECTED NOT DETECTED Final   Vibrio species NOT DETECTED NOT DETECTED Final   Vibrio cholerae NOT DETECTED NOT DETECTED Final   Enteroaggregative E coli (EAEC) NOT DETECTED NOT DETECTED Final   Enteropathogenic E coli (EPEC) NOT DETECTED NOT DETECTED Final   Enterotoxigenic E coli (ETEC) NOT DETECTED NOT DETECTED Final   Shiga like toxin producing E coli (STEC) NOT DETECTED NOT DETECTED Final   Shigella/Enteroinvasive E coli (EIEC) NOT DETECTED NOT DETECTED Final   Cryptosporidium NOT DETECTED NOT DETECTED Final   Cyclospora cayetanensis NOT DETECTED NOT DETECTED Final   Entamoeba histolytica NOT DETECTED NOT DETECTED Final   Giardia lamblia NOT DETECTED NOT DETECTED Final   Adenovirus F40/41 NOT DETECTED NOT DETECTED Final   Astrovirus NOT DETECTED NOT DETECTED Final   Norovirus GI/GII NOT DETECTED NOT DETECTED Final   Rotavirus A NOT DETECTED NOT DETECTED Final   Sapovirus (I, II, IV, and V) NOT DETECTED NOT DETECTED Final    Comment: Performed at [facility], [address] [place], [place]  [address]  C. Diff by PCR, Reflexed     Status: Abnormal   Collection Time: 08/02/23  2:05 PM  Result Value Ref Range Status   Toxigenic C. Difficile by  PCR POSITIVE (A) NEGATIVE Final    Comment: Positive for toxigenic C. difficile with little to no toxin production. Only treat if clinical presentation suggests symptomatic illness. Performed at [facility], [address], [place], [place] [address]      Radiology Studies: DG Chest 2 View Result Date: 08/02/2023 CLINICAL DATA:  Weakness. EXAM: CHEST - 2 VIEW COMPARISON:  Chest radiograph dated June 27, 2023. FINDINGS: The heart size and mediastinal contours are within normal limits. Aortic atherosclerosis. No focal consolidation, pleural effusion, or pneumothorax. Similar dextrocurvature of the midthoracic spine. No acute osseous abnormality. IMPRESSION: No acute cardiopulmonary findings. Electronically Signed   By: Hart Robinsons M.D.   On: 08/02/2023 11:38    Scheduled Meds:  aspirin EC  81 mg Oral Daily   atorvastatin  80 mg Oral Daily   clopidogrel  75 mg Oral Daily   dicyclomine  10 mg Oral TID AC   enoxaparin (LOVENOX) injection  40 mg Subcutaneous Q24H   ferrous sulfate  325 mg Oral Daily   fidaxomicin  200 mg Oral BID   isosorbide mononitrate  120 mg Oral BID   levothyroxine  88 mcg Oral Q0600   midodrine  10 mg Oral TID WC   montelukast  10 mg Oral QHS   ranolazine  500 mg Oral BID   sodium chloride flush  3 mL Intravenous Q12H   Continuous Infusions:  potassium PHOSPHATE 30 mmol in dextrose 5 % 250 mL infusion     potassium PHOSPHATE IVPB (in mmol)       LOS: 0 days   Time spent: 40 minutes  Carollee Herter, DO  [facility]  08/03/2023, 11:49 AM

## 2023-08-03 NOTE — Evaluation (Signed)
Physical Therapy Evaluation Patient Details Name: Abigail Ortega MRN: [id number] DOB: May 11, 1951 Today's Date: 08/03/2023  History of Present Illness  Pt is a 73 y.o. female presenting to hospital 08/02/23 from gastroenterology clinic with c/o hypotension, generalized weakness, and recurrent diarrhea.  Pt admitted with acute colitis, hypokalemia, chronic hypotension, severe hypothyroidism, CAD, and chronic combined systolic and diastolic CHF.  PMH includes C. Difficile colitis, CAD s/p dES, R breast CA s/p chemoradiation and mastectomy, hypothyroidism, htn, HLD, osteoporosis, anemia, CHF, IBS.  Clinical Impression  Prior to recent medical concerns, pt reports being ambulatory; sister stays with pt in 1 level home with 3-4 STE B railings.  No c/o pain during session.  Currently pt is SBA with logrolling in bed; pt declined any further activity d/t concerns of diarrhea with any more activity.  Pt reporting generalized weakness and decreased activity tolerance d/t diarrhea.  Pt would currently benefit from skilled PT to address noted impairments and functional limitations (see below for any additional details).  Upon hospital discharge, pt would benefit from ongoing therapy.     If plan is discharge home, recommend the following: A lot of help with walking and/or transfers;A little help with bathing/dressing/bathroom;Assist for transportation;Help with stairs or ramp for entrance   Can travel by private vehicle   No    Equipment Recommendations Other (comment) (TBD at next facility)  Recommendations for Other Services       Functional Status Assessment Patient has had a recent decline in their functional status and demonstrates the ability to make significant improvements in function in a reasonable and predictable amount of time.     Precautions / Restrictions Precautions Precautions: Fall Precaution Comments: No BP R UE Restrictions Weight Bearing Restrictions Per Provider Order:  Yes Other Position/Activity Restrictions: Pt had been wearing splint on L wrist (d/t h/o fx).      Mobility  Bed Mobility Overal bed mobility: Needs Assistance Bed Mobility: Rolling Rolling: Supervision         General bed mobility comments: rolling L/R in bed    Transfers                   General transfer comment: Pt declined d/t feeling like she was going to have diarrhea with this activity    Ambulation/Gait                  Stairs            Wheelchair Mobility     Tilt Bed    Modified Rankin (Stroke Patients Only)       Balance                                             Pertinent Vitals/Pain Pain Assessment Pain Assessment: No/denies pain Vitals (HR and SpO2 on room air) stable throughout treatment session.    Home Living Family/patient expects to be discharged to:: Skilled nursing facility Living Arrangements: Other relatives (Sister stays with pt) Available Help at Discharge: Family;Available 24 hours/day Type of Home: House Home Access: Stairs to enter Entrance Stairs-Rails: Right;Left;Can reach both Entrance Stairs-Number of Steps: 3-4   Home Layout: One level Home Equipment: Agricultural consultant (2 wheels);Cane - single point;Wheelchair - manual;Shower seat;Grab bars - tub/shower Additional Comments: Duplex    Prior Function Prior Level of Function : Needs assist  Mobility Comments: Pt reports being independent with ambulation prior to this hospitalization.  Per chart about 1 month ago pt reported having limited mobility lately (essentially bed bound with son or daughter providing care).  Pt reports making significant progress while at rehab but became weak when she returned home d/t diarrhea (requiring increased assist). ADLs Comments: no family present to confirm     Extremity/Trunk Assessment   Upper Extremity Assessment Upper Extremity Assessment: Generalized weakness    Lower  Extremity Assessment Lower Extremity Assessment: Generalized weakness       Communication   Communication Communication: No apparent difficulties Cueing Techniques: Verbal cues  Cognition Arousal: Alert Behavior During Therapy: WFL for tasks assessed/performed Overall Cognitive Status: No family/caregiver present to determine baseline cognitive functioning                                 General Comments: A&O to person, place, hospital, and general situation        General Comments  Nursing cleared pt for participation in physical therapy.  Pt agreeable to limited PT session.     Exercises     Assessment/Plan    PT Assessment Patient needs continued PT services  PT Problem List Decreased strength;Decreased activity tolerance;Decreased mobility       PT Treatment Interventions DME instruction;Gait training;Stair training;Functional mobility training;Therapeutic activities;Therapeutic exercise;Balance training;Patient/family education    PT Goals (Current goals can be found in the Care Plan section)  Acute Rehab PT Goals Patient Stated Goal: to improve strength and walking PT Goal Formulation: With patient Time For Goal Achievement: 08/17/23 Potential to Achieve Goals: Good    Frequency Min 1X/week     Co-evaluation               AM-PAC PT "6 Clicks" Mobility  Outcome Measure Help needed turning from your back to your side while in a flat bed without using bedrails?: A Little Help needed moving from lying on your back to sitting on the side of a flat bed without using bedrails?: A Little Help needed moving to and from a bed to a chair (including a wheelchair)?: A Little Help needed standing up from a chair using your arms (e.g., wheelchair or bedside chair)?: A Lot Help needed to walk in hospital room?: A Lot Help needed climbing 3-5 steps with a railing? : Total 6 Click Score: 14    End of Session   Activity Tolerance: Patient tolerated  treatment well Patient left: in bed;with call bell/phone within reach (in ED stretcher bed) Nurse Communication: Mobility status;Precautions PT Visit Diagnosis: Other abnormalities of gait and mobility (R26.89);Muscle weakness (generalized) (M62.81)    Time: 3244-0102 PT Time Calculation (min) (ACUTE ONLY): 19 min   Charges:   PT Evaluation $PT Eval Low Complexity: 1 Low   PT General Charges $$ ACUTE PT VISIT: 1 Visit        Hendricks Limes, PT 08/03/23, 4:11 PM

## 2023-08-03 NOTE — Evaluation (Signed)
3Occupational Therapy Evaluation Patient Details Name: Abigail Ortega MRN: [id number] DOB: 03/13/1951 Today's Date: 08/03/2023   History of Present Illness 73 y.o. female with medical history significant of recent C. difficile colitis s/p treatment with vancomycin (December 2024), CAD s/p DES, breast cancer s/p chemoradiation and mastectomy, hypothyroidism, hypertension, hyperlipidemia, osteoporosis, who presents to the ED due to diarrhea x10 days.   Clinical Impression   Pt was seen for OT evaluation this date. Prior to hospital admission, pt was with family and requiring increasing assist in all aspects over the past 10 days at home since returning from SNF. Pt notes family assist for ADL and IADL. Pt presents to acute OT demonstrating impaired ADL performance and functional mobility 2/2 decreased strength, balance, activity tolerance (See OT problem list for additional functional deficits). Pt currently requires no direct assist for rolling side to side for assistance with fecal incontinence episode. Prior to EOB/standing attempts, IV team came to place IV. Pt would benefit from skilled OT services to address noted impairments and functional limitations (see below for any additional details) in order to maximize safety and independence while minimizing falls risk and caregiver burden.     If plan is discharge home, recommend the following: A little help with walking and/or transfers;A lot of help with bathing/dressing/bathroom;Assistance with cooking/housework;Assist for transportation;Help with stairs or ramp for entrance    Functional Status Assessment  Patient has had a recent decline in their functional status and demonstrates the ability to make significant improvements in function in a reasonable and predictable amount of time.  Equipment Recommendations  Other (comment) (defer to next venue)    Recommendations for Other Services       Precautions / Restrictions  Precautions Precautions: Fall Restrictions Weight Bearing Restrictions Per Provider Order: No      Mobility Bed Mobility Overal bed mobility: Needs Assistance Bed Mobility: Rolling Rolling: Supervision         General bed mobility comments: rolling side to side for bed level pericare of incontinent bowel    Transfers                   General transfer comment: deferred, IV team to place IV      Balance                                           ADL either performed or assessed with clinical judgement   ADL Overall ADL's : Needs assistance/impaired                                       General ADL Comments: Pt required MAX A for pericare and clothing mgt from bed level, anticipate increased assist for ADL mobility and LB ADL     Vision         Perception         Praxis         Pertinent Vitals/Pain Pain Assessment Pain Assessment: No/denies pain     Extremity/Trunk Assessment Upper Extremity Assessment Upper Extremity Assessment: Generalized weakness   Lower Extremity Assessment Lower Extremity Assessment: Generalized weakness       Communication Communication Communication: No apparent difficulties   Cognition Arousal: Alert Behavior During Therapy: WFL for tasks assessed/performed Overall Cognitive Status: No family/caregiver present to determine baseline cognitive  functioning                                 General Comments: pt alert and pleasant, follows simple cues well     General Comments       Exercises     Shoulder Instructions      Home Living Family/patient expects to be discharged to:: Skilled nursing facility Living Arrangements: Other relatives Available Help at Discharge: Family;Available 24 hours/day Type of Home: House                                  Prior Functioning/Environment Prior Level of Function : Needs assist             Mobility  Comments: Per chart ~15mo ago patient reports she has had limited mobility lately. Has essentially been bed bound with son or daughter providing care. Patient reports she lives with her sister. Would walk short distance with walker to bathroom with assist until she couldn't anymore. Went to rehab after last hospital admission and does report making significant progress while at rehab but when returned home became very weak 2/2 diarrhea requiring increased assist. ADLs Comments: no family present to confirm        OT Problem List: Decreased strength;Decreased activity tolerance;Impaired balance (sitting and/or standing);Decreased knowledge of use of DME or AE      OT Treatment/Interventions: Self-care/ADL training;Therapeutic exercise;Therapeutic activities;DME and/or AE instruction;Patient/family education;Balance training    OT Goals(Current goals can be found in the care plan section) Acute Rehab OT Goals Patient Stated Goal: get better OT Goal Formulation: With patient Time For Goal Achievement: 08/17/23 Potential to Achieve Goals: Good ADL Goals Pt Will Perform Lower Body Dressing: with modified independence;sit to/from stand Pt Will Transfer to Toilet: with supervision;ambulating (LRAD,) Pt Will Perform Toileting - Clothing Manipulation and hygiene: with modified independence Additional ADL Goal #1: Pt will verbalize plan to implement at least 1 learned falls prevention/energy conservation stategy to maximize safety/indep with ADL/mobility.  OT Frequency: Min 1X/week    Co-evaluation              AM-PAC OT "6 Clicks" Daily Activity     Outcome Measure Help from another person eating meals?: None Help from another person taking care of personal grooming?: A Little Help from another person toileting, which includes using toliet, bedpan, or urinal?: A Lot Help from another person bathing (including washing, rinsing, drying)?: A Lot Help from another person to put on and taking  off regular upper body clothing?: A Little Help from another person to put on and taking off regular lower body clothing?: A Lot 6 Click Score: 16   End of Session    Activity Tolerance: Patient tolerated treatment well Patient left: in bed;with call bell/phone within reach;with nursing/sitter in room  OT Visit Diagnosis: Other abnormalities of gait and mobility (R26.89);Muscle weakness (generalized) (M62.81)                Time: 1050-1105 OT Time Calculation (min): 15 min Charges:  OT General Charges $OT Visit: 1 Visit OT Evaluation $OT Eval Moderate Complexity: 1 Mod  Arman Filter., MPH, MS, OTR/L ascom [phone] 08/03/23, 2:44 PM

## 2023-08-03 NOTE — Subjective & Objective (Addendum)
Pt seen and examined. Met with pt and grand-dtr at bedside. Pt developed diarrhea about 5 days after returning home from SNF.  Was admitted last month for c. Diff. Treated with po vanco.  Started on dificid.  Found out from pharmacy that course of dificid copay is $12 for patient.  No further diarrhea since last night.  WBC 5.8 today. No abd pain.  Eating breakfast. No nausea.

## 2023-08-03 NOTE — ED Notes (Signed)
Informed RN bed assigned 

## 2023-08-03 NOTE — ED Notes (Addendum)
Patient's IV infiltrated. Pt a difficult stick. IV consult put in, and noted that the patient will be going to room 203, and iv team can gain new access there. Note left by this RN in IV consult note. Rn Susy Manor notified via secure chat of pt's infiltration, and iv team consult. Also notified that patient's potassium phosphate to be tube up by this RN.

## 2023-08-03 NOTE — Progress Notes (Addendum)
Mobility Specialist - Progress Note   08/03/23 1600  Mobility  Activity Stood at bedside  Level of Assistance Minimal assist, patient does 75% or more  Assistive Device Front wheel walker  Range of Motion/Exercises Active  Activity Response Tolerated well  Mobility visit 1 Mobility     Pt lying in bed upon arrival, utilizing RA. Pt pleasant and agreeable to activity. Completed bed mobility with supervision. STS and lateral stepping with minA-CGA d/t limited ability in LUE. No LOB. Pt returned supine with assist onto LE. Alarm set, needs in reach.    Abigail Ortega Mobility Specialist 08/03/23, 4:05 PM

## 2023-08-03 NOTE — Hospital Course (Addendum)
72yo with h/o CAD s/p DES, breast CA s/p chemorads and mastectomy, hypothyroidism, HTN, HLD, and recent C. difficile colitis s/p treatment with vancomycin (December 2024)  who presented on 1/16 with persistent diarrhea and abdominal pain, concerning for treatment failure vs. Recurrence.  C diff PCR and antigen +, toxin -.  GI/ID consulted.  1/21 CT with mild-moderate colitis.  1/27 colonoscopy with diverticulosis, normal biopsies.  1/27 EGD with medium-sized hiatal hernia, grade C esophagitis.  GI suspects postinfectious IBS and recommends anti-diarrheal medication with outpatient GI follow-up.

## 2023-08-03 NOTE — ED Notes (Signed)
Patient complains of nausea.

## 2023-08-04 DIAGNOSIS — D649 Anemia, unspecified: Secondary | ICD-10-CM | POA: Insufficient documentation

## 2023-08-04 DIAGNOSIS — E538 Deficiency of other specified B group vitamins: Secondary | ICD-10-CM

## 2023-08-04 DIAGNOSIS — E876 Hypokalemia: Secondary | ICD-10-CM | POA: Diagnosis not present

## 2023-08-04 DIAGNOSIS — A0472 Enterocolitis due to Clostridium difficile, not specified as recurrent: Secondary | ICD-10-CM | POA: Diagnosis not present

## 2023-08-04 DIAGNOSIS — I5042 Chronic combined systolic (congestive) and diastolic (congestive) heart failure: Secondary | ICD-10-CM | POA: Diagnosis not present

## 2023-08-04 DIAGNOSIS — I9589 Other hypotension: Secondary | ICD-10-CM | POA: Diagnosis not present

## 2023-08-04 LAB — CBC WITH DIFFERENTIAL/PLATELET
Abs Immature Granulocytes: 0.13 10*3/uL — ABNORMAL HIGH (ref 0.00–0.07)
Basophils Absolute: 0.1 10*3/uL (ref 0.0–0.1)
Basophils Relative: 1 %
Eosinophils Absolute: 0.3 10*3/uL (ref 0.0–0.5)
Eosinophils Relative: 5 %
HCT: 24 % — ABNORMAL LOW (ref 36.0–46.0)
Hemoglobin: 8.1 g/dL — ABNORMAL LOW (ref 12.0–15.0)
Immature Granulocytes: 2 %
Lymphocytes Relative: 35 %
Lymphs Abs: 2.1 10*3/uL (ref 0.7–4.0)
MCH: 32 pg (ref 26.0–34.0)
MCHC: 33.8 g/dL (ref 30.0–36.0)
MCV: 94.9 fL (ref 80.0–100.0)
Monocytes Absolute: 0.5 10*3/uL (ref 0.1–1.0)
Monocytes Relative: 8 %
Neutro Abs: 2.9 10*3/uL (ref 1.7–7.7)
Neutrophils Relative %: 49 %
Platelets: 479 10*3/uL — ABNORMAL HIGH (ref 150–400)
RBC: 2.53 MIL/uL — ABNORMAL LOW (ref 3.87–5.11)
RDW: 15.4 % (ref 11.5–15.5)
WBC: 6 10*3/uL (ref 4.0–10.5)
nRBC: 0 % (ref 0.0–0.2)

## 2023-08-04 LAB — BASIC METABOLIC PANEL
Anion gap: 8 (ref 5–15)
BUN: 6 mg/dL — ABNORMAL LOW (ref 8–23)
CO2: 21 mmol/L — ABNORMAL LOW (ref 22–32)
Calcium: 7.4 mg/dL — ABNORMAL LOW (ref 8.9–10.3)
Chloride: 104 mmol/L (ref 98–111)
Creatinine, Ser: 0.92 mg/dL (ref 0.44–1.00)
GFR, Estimated: >60 mL/min (ref >60)
Glucose, Bld: 79 mg/dL (ref 70–99)
Potassium: 3.3 mmol/L — ABNORMAL LOW (ref 3.5–5.1)
Sodium: 133 mmol/L — ABNORMAL LOW (ref 135–145)

## 2023-08-04 LAB — IRON AND TIBC
Iron: 50 ug/dL (ref 28–170)
Saturation Ratios: 35 % — ABNORMAL HIGH (ref 10.4–31.8)
TIBC: 141 ug/dL — ABNORMAL LOW (ref 250–450)
UIBC: 91 ug/dL

## 2023-08-04 LAB — PHOSPHORUS: Phosphorus: 1.9 mg/dL — ABNORMAL LOW (ref 2.5–4.6)

## 2023-08-04 LAB — MAGNESIUM: Magnesium: 1.9 mg/dL (ref 1.7–2.4)

## 2023-08-04 MED ORDER — POTASSIUM CHLORIDE CRYS ER 20 MEQ PO TBCR
40.0000 meq | EXTENDED_RELEASE_TABLET | ORAL | Status: AC
  Administered 2023-08-04 (×2): 40 meq via ORAL
  Filled 2023-08-04 (×2): qty 2

## 2023-08-04 MED ORDER — FOLIC ACID 1 MG PO TABS
1.0000 mg | ORAL_TABLET | Freq: Every day | ORAL | Status: DC
Start: 2023-08-04 — End: 2023-08-21
  Administered 2023-08-04 – 2023-08-20 (×16): 1 mg via ORAL
  Filled 2023-08-04 (×17): qty 1

## 2023-08-04 MED ORDER — K PHOS MONO-SOD PHOS DI & MONO 155-852-130 MG PO TABS
500.0000 mg | ORAL_TABLET | ORAL | Status: AC
  Administered 2023-08-04 (×2): 500 mg via ORAL
  Filled 2023-08-04 (×2): qty 2

## 2023-08-04 NOTE — Progress Notes (Signed)
PROGRESS NOTE    Abigail Ortega  ZOX:[id number] DOB: 1951/03/03 DOA: 08/02/2023 PCP: Margaretann Loveless, MD  Subjective: Pt seen and examined.  Only 1 diarrheal stool yesterday recorded Arranged for outpatient [facility] pharmacy to obtain 10 days of dificid for patient. Currently held in inpatient pharmacy for pt to take home.  PT recommending SNF placement at discharge [facility] consulted.  Pt is afebrile. WBC 6.0   Hospital Course: HPI: Abigail Ortega is a 73 y.o. female with medical history significant of recent C. difficile colitis s/p treatment with vancomycin (December 2024), CAD s/p DES, breast cancer s/p chemoradiation and mastectomy, hypothyroidism, hypertension, hyperlipidemia, osteoporosis, who presents to the ED due to diarrhea.   [name] states that since she was discharged from SNF approximately 10 days ago, she has been experiencing persistent diarrhea with some generalized abdominal pain.  She denies any nausea or vomiting but notes poor p.o. intake.  She has been experiencing fecal incontinence.  Denies melena or hematochezia, fever/chills.She does endorse shortness of breath with exertion only, but denies any chest pain, palpitations.  She notes that shortness of breath with exertion began after her recent hospitalization, stating she just feels very weak and.   ED course: On arrival to the ED, patient was normotensive at 110/73 with heart rate of 59.  She is saturating at 98% on room air.  She was afebrile at 98.5.  Initial workup notable for hemoglobin of 8.9, platelets 533, potassium 2.6, creatinine 1.13, GFR 51.  Lactic acid within normal limits.  Patient started on IV fluids and potassium supplementation.  TR[facility] contacted for admission.  Significant Events: Admitted 08/02/2023 with acute colitis, rule out recurrent C. diff   Significant Labs: hemoglobin of 8.9, platelets 533, potassium 2.6, creatinine 1.13, GFR 51. Lactic acid within normal limits   Significant  Imaging Studies: CXR No acute cardiopulmonary findings.   Antibiotic Therapy: Anti-infectives (From admission, onward)    Start     Dose/Rate Route Frequency Ordered Stop   08/02/23 2245  fidaxomicin (DIFICID) tablet 200 mg        200 mg Oral 2 times daily 08/02/23 2242 08/12/23 2159       Procedures:   Consultants:     Assessment and Plan: * C. difficile colitis On admission, Patient is presenting with 10 days of severe diarrhea and fecal incontinence in the setting of recent C. difficile colitis approximately 1 month prior, concerning for treatment failure versus reinfection.  Examination is nonfocal, so will hold off on imaging at this time 08-03-2023 pt's C. Diff PCR and antigen were POSITIVE. Diarrhea was present on admission so this is NOT an HAI. C. Diff toxin negative but given recent bout of c. Diff diarrhea last month that was treated with po vanco, will consider this a recurrence of C. Diff.  Continue with dificid bid x 10 days. Possible home tomorrow if no more diarrhea.  08-04-2023 only 1 diarrheal stool yesterday. PT recs SNF at discharge. Will consult TOC.  Arranged for outpatient [facility] pharmacy to obtain 10 days of dificid for patient. Currently held in inpatient pharmacy for pt to take home. WBC is normal.    Hypokalemia On admission, In the setting of diarrhea.  EKG demonstrating QTc prolongation. S/p 60 meq replacement. 08-03-2023 K still 2.8. will give Kphos  08-04-2023 give more po kcl. Repeat EKG to document shortening of QTc  Severe hypothyroidism On most recent admission in December 2024, TSH was noted to be markedly elevated at 270.  She  was treated with IV Synthroid initially and transition to p.o. 08-03-2023 TSH 89.9. improved from 270. Continue at same dose of synthroid.  08-04-2023 stable  Chronic hypotension On admission, Initial concern at GI office earlier today for hypotension, however patient's blood pressure has been within normal limits  since arrival to the ED. Continue home midodrine 08-03-2023 BP improved with IVF. Continue midodrine.  08-04-2023 stable. Continue home midodrine.  CAD (coronary artery disease) On admission, Patient reports shortness of breath on exertion but denies any chest pain with exertion.  This is likely due to deconditioning after prolonged hospitalization and rehabilitation over the last month. Continue home regimen 08-03-2023 no further SOB.  08-04-2023 stable  Chronic combined systolic and diastolic CHF (congestive heart failure) (HCC) On admission, Patient appears hypovolemic on examination in the setting of diarrhea.  Hold home Lasix.  Daily weights 08-03-2023 appears euvolemic. Consider restarting lasix tomorrow.  08-04-2023 pt still having diarrhea. Continue to hold lasix.  Normocytic anemia 08-04-2023. Will repeat iron studies. Folate levels low. Start folic acid.   Anemia Workup Lab Results  Component Value Date/Time   VITAMINB12 598 05/10/2023 01:21 PM   FOLATE 4.6 (L) 06/28/2023 06:32 AM   FERRITIN 299 05/10/2023 01:20 PM   TIBC 252 05/10/2023 01:20 PM   IRON 63 05/10/2023 01:20 PM   RETICCTPCT 2.2 05/10/2023 01:20 PM    Folate deficiency 08-04-2023 start folic acid 1 mg daily.   DVT prophylaxis: enoxaparin (LOVENOX) injection 40 mg Start: 08/02/23 2200    Code Status: Do not attempt resuscitation (DNR) PRE-ARREST INTERVENTIONS DESIRED Family Communication: no family at bedside Disposition Plan: home vs SNF Reason for continuing need for hospitalization: replete K. Needs TOC to search for SNF bed  Objective: Vitals:   08/03/23 1635 08/03/23 1948 08/04/23 0349 08/04/23 0800  BP: 108/76 116/69 109/63 103/60  Pulse: 70 86 83 86  Resp: 18 20 20 18   Temp: 98.2 F (36.8 C) 98 F (36.7 C) 98.4 F (36.9 C) 98.4 F (36.9 C)  TempSrc: Oral Oral Oral   SpO2: 100% 100% 100% 100%  Weight:      Height:        Intake/Output Summary (Last 24 hours) at 08/04/2023  0850 Last data filed at 08/03/2023 1822 Gross per 24 hour  Intake 338.8 ml  Output --  Net 338.8 ml   Filed Weights   08/02/23 1039 08/03/23 0702  Weight: 58 kg 61.2 kg    Examination:  Physical Exam Vitals and nursing note reviewed.  HENT:     Head: Normocephalic and atraumatic.     Nose: Nose normal.  Eyes:     General: No scleral icterus. Cardiovascular:     Rate and Rhythm: Normal rate and regular rhythm.  Pulmonary:     Effort: Pulmonary effort is normal.  Abdominal:     General: Bowel sounds are normal. There is no distension.     Palpations: Abdomen is soft.     Tenderness: There is no abdominal tenderness.  Musculoskeletal:     Right lower leg: No edema.     Left lower leg: No edema.  Skin:    General: Skin is warm and dry.     Capillary Refill: Capillary refill takes less than 2 seconds.  Neurological:     General: No focal deficit present.     Mental Status: She is alert and oriented to person, place, and time.     Data Reviewed: I have personally reviewed following labs and imaging studies  CBC:  Recent Labs  Lab 08/02/23 1042 08/03/23 0537 08/04/23 0416  WBC 7.3 5.8 6.0  NEUTROABS 5.3  --  2.9  HGB 8.9* 8.0* 8.1*  HCT 27.5* 23.4* 24.0*  MCV 96.8 92.9 94.9  PLT 533* 483* 479*   Basic Metabolic Panel: Recent Labs  Lab 08/02/23 1042 08/03/23 0537 08/04/23 0416  NA 135 135 133*  K 2.6* 2.8* 3.3*  CL 95* 101 104  CO2 22 25 21*  GLUCOSE 80 97 79  BUN 13 9 6*  CREATININE 1.13* 0.84 0.92  CALCIUM 8.1* 7.5* 7.4*  MG 1.7 2.1 1.9  PHOS  --  1.1* 1.9*   GFR: Estimated Creatinine Clearance: 49 mL/min (by C-G formula based on SCr of 0.92 mg/dL). Liver Function Tests: Recent Labs  Lab 08/02/23 1042  AST 21  ALT 10  ALKPHOS 106  BILITOT 1.4*  PROT 6.3*  ALBUMIN 2.9*   Coagulation Profile: Recent Labs  Lab 08/02/23 1042  INR 1.0   Thyroid Function Tests: Recent Labs    08/02/23 1042  TSH 89.000*   Sepsis Labs: Recent Labs   Lab 08/02/23 1042  LATICACIDVEN 1.6    Recent Results (from the past 240 hours)  Culture, blood (Routine x 2)     Status: None (Preliminary result)   Collection Time: 08/02/23 10:42 AM   Specimen: BLOOD  Result Value Ref Range Status   Specimen Description BLOOD RIGHT [facility]  Final   Special Requests   Final    BOTTLES DRAWN AEROBIC AND ANAEROBIC Blood Culture adequate volume   Culture   Final    NO GROWTH 2 DAYS Performed at [facility], [address], [place], [place] [address]    Report Status PENDING  Incomplete  Culture, blood (Routine x 2)     Status: None (Preliminary result)   Collection Time: 08/02/23 11:54 AM   Specimen: Right Antecubital; Blood  Result Value Ref Range Status   Specimen Description RIGHT ANTECUBITAL  Final   Special Requests   Final    BOTTLES DRAWN AEROBIC AND ANAEROBIC Blood Culture results may not be optimal due to an inadequate volume of blood received in culture bottles   Culture   Final    NO GROWTH 2 DAYS Performed at [facility], [address] [place], [place] [address]    Report Status PENDING  Incomplete  C Difficile Quick Screen w PCR reflex     Status: Abnormal   Collection Time: 08/02/23  2:05 PM  Result Value Ref Range Status   C Diff antigen POSITIVE (A) NEGATIVE Final   C Diff toxin NEGATIVE NEGATIVE Final   C Diff interpretation Results are indeterminate. See PCR results.  Final    Comment: Performed at [facility], [address] [place], [place] [address]  Gastrointestinal Panel by PCR , Stool     Status: None   Collection Time: 08/02/23  2:05 PM  Result Value Ref Range Status   Campylobacter species NOT DETECTED NOT DETECTED Final   Plesimonas shigelloides NOT DETECTED NOT DETECTED Final   Salmonella species NOT DETECTED NOT DETECTED Final   Yersinia enterocolitica NOT DETECTED NOT DETECTED Final   Vibrio species NOT DETECTED NOT DETECTED Final   Vibrio cholerae NOT DETECTED NOT DETECTED  Final   Enteroaggregative E coli (EAEC) NOT DETECTED NOT DETECTED Final   Enteropathogenic E coli (EPEC) NOT DETECTED NOT DETECTED Final   Enterotoxigenic E coli (ETEC) NOT DETECTED NOT DETECTED Final   Shiga like toxin producing E  coli (STEC) NOT DETECTED NOT DETECTED Final   Shigella/Enteroinvasive E coli (EIEC) NOT DETECTED NOT DETECTED Final   Cryptosporidium NOT DETECTED NOT DETECTED Final   Cyclospora cayetanensis NOT DETECTED NOT DETECTED Final   Entamoeba histolytica NOT DETECTED NOT DETECTED Final   Giardia lamblia NOT DETECTED NOT DETECTED Final   Adenovirus F40/41 NOT DETECTED NOT DETECTED Final   Astrovirus NOT DETECTED NOT DETECTED Final   Norovirus GI/GII NOT DETECTED NOT DETECTED Final   Rotavirus A NOT DETECTED NOT DETECTED Final   Sapovirus (I, II, IV, and V) NOT DETECTED NOT DETECTED Final    Comment: Performed at [facility], [address], [place], [place] [address]  C. Diff by PCR, Reflexed     Status: Abnormal   Collection Time: 08/02/23  2:05 PM  Result Value Ref Range Status   Toxigenic C. Difficile by PCR POSITIVE (A) NEGATIVE Final    Comment: Positive for toxigenic C. difficile with little to no toxin production. Only treat if clinical presentation suggests symptomatic illness. Performed at [facility], [address] [place], [place] [address]      Radiology Studies: DG Chest 2 View Result Date: 08/02/2023 CLINICAL DATA:  Weakness. EXAM: CHEST - 2 VIEW COMPARISON:  Chest radiograph dated June 27, 2023. FINDINGS: The heart size and mediastinal contours are within normal limits. Aortic atherosclerosis. No focal consolidation, pleural effusion, or pneumothorax. Similar dextrocurvature of the midthoracic spine. No acute osseous abnormality. IMPRESSION: No acute cardiopulmonary findings. Electronically Signed   By: Hart Robinsons M.D.   On: 08/02/2023 11:38    Scheduled Meds:  aspirin EC  81 mg Oral Daily   atorvastatin  80 mg Oral  Daily   clopidogrel  75 mg Oral Daily   dicyclomine  10 mg Oral TID AC   enoxaparin (LOVENOX) injection  40 mg Subcutaneous Q24H   ferrous sulfate  325 mg Oral Daily   fidaxomicin  200 mg Oral BID   folic acid  1 mg Oral Daily   influenza vaccine adjuvanted  0.5 mL Intramuscular Tomorrow-1000   isosorbide mononitrate  120 mg Oral BID   levothyroxine  88 mcg Oral Q0600   midodrine  10 mg Oral TID WC   montelukast  10 mg Oral QHS   phosphorus  500 mg Oral Q4H   potassium chloride  40 mEq Oral Q4H   ranolazine  500 mg Oral BID   sodium chloride flush  3 mL Intravenous Q12H   Continuous Infusions:   LOS: 1 day   Time spent: 40 minutes  Carollee Herter, DO  [facility]  08/04/2023, 8:50 AM

## 2023-08-04 NOTE — NC FL2 (Signed)
[place] MEDICAID FL2 LEVEL OF CARE FORM     IDENTIFICATION  Patient Name: Abigail Ortega Birthdate: May 27, 1951 Sex: female Admission Date (Current Location): 08/02/2023  [facility] and [place] Number:  Chiropodist and Address:  [facility], [address], [place], [place] [address]      Provider Number: [id number]  Attending Physician Name and Address:  Carollee Herter, DO  Relative Name and Phone Number:  Rometta Emery (Sister)  [phone] [facility])    Current Level of Care: Hospital Recommended Level of Care: Skilled Nursing Facility Prior Approval Number:    Date Approved/Denied:   PASRR Number:    Discharge Plan:      Current Diagnoses: Patient Active Problem List   Diagnosis Date Noted   Normocytic anemia 08/04/2023   Folate deficiency 07/01/2023   Malnutrition of moderate degree 06/30/2023   Hypokalemia 06/28/2023   Acute kidney injury superimposed on chronic kidney disease (HCC) 06/28/2023   Coronary artery disease with stable angina 06/28/2023   Chronic obstructive lung disease (HCC) 06/28/2023   C. difficile colitis 06/27/2023   Dyspepsia 01/30/2023   Prediabetes 01/30/2023   Gastroesophageal reflux disease without esophagitis 01/30/2023   Other irritable bowel syndrome 01/30/2023   Cervicalgia 11/28/2022   Hemorrhoids 11/28/2022   Subacute cough 09/29/2022   COPD (chronic obstructive pulmonary disease) with acute bronchitis (HCC) 09/29/2022   Osteoporosis 10/25/2021   Coronary syndrome, acute (HCC) 08/23/2018   Anal fissure    History of colonic polyps 12/23/2016   Hyponatremia 12/21/2016   GIB (gastrointestinal bleeding) 12/12/2016   Depressive disorder 01/14/2016   Headache 01/14/2016   Joint pain 01/14/2016   Myocardial infarction (HCC) 01/14/2016   Obesity (BMI 35.0-39.9 without comorbidity) 08/20/2015   Anal pain 01/13/2015   Anxiety 12/16/2013   Breast cancer (HCC) 12/16/2013   CAD (coronary artery  disease) 12/16/2013   Hyperlipidemia, unspecified 12/16/2013   Chronic hypotension 12/16/2013   Nausea 12/16/2013   Severe hypothyroidism 12/16/2013   Acquired absence of breast 03/04/2013   Chronic combined systolic and diastolic CHF (congestive heart failure) (HCC) 03/04/2013   Breast cancer of lower-inner quadrant of right female breast (HCC) 01/28/2013    Orientation RESPIRATION BLADDER Height & Weight     Self, Time, Situation, Place  Normal Incontinent Weight: 135 lb (61.2 kg) Height:  5\' 5"  (165.1 cm)  BEHAVIORAL SYMPTOMS/MOOD NEUROLOGICAL BOWEL NUTRITION STATUS      Incontinent Diet (reg)  AMBULATORY STATUS COMMUNICATION OF NEEDS Skin   Limited Assist Verbally Other (Comment) (redness)                       Personal Care Assistance Level of Assistance  Bathing, Feeding, Dressing Bathing Assistance: Limited assistance Feeding assistance: Limited assistance Dressing Assistance: Limited assistance     Functional Limitations Info             SPECIAL CARE FACTORS FREQUENCY  PT (By licensed PT), OT (By licensed OT)     PT Frequency: 5 times per week OT Frequency: 5 times per week            Contractures      Additional Factors Info  Code Status, Allergies Code Status Info: DNR Allergies Info: Nausea Control (Emetrol), Other, Effexor (Venlafaxine), Hydroxyzine           Current Medications (08/04/2023):  This is the current hospital active medication list Current Facility-Administered Medications  Medication Dose Route Frequency Provider Last Rate Last Admin   acetaminophen (TYLENOL) tablet  650 mg  650 mg Oral Q6H PRN Verdene Lennert, MD       Or   acetaminophen (TYLENOL) suppository 650 mg  650 mg Rectal Q6H PRN Verdene Lennert, MD       aspirin EC tablet 81 mg  81 mg Oral Daily Verdene Lennert, MD   81 mg at 08/04/23 0842   atorvastatin (LIPITOR) tablet 80 mg  80 mg Oral Daily Verdene Lennert, MD   80 mg at 08/04/23 8657   clopidogrel (PLAVIX)  tablet 75 mg  75 mg Oral Daily Verdene Lennert, MD   75 mg at 08/04/23 8469   dicyclomine (BENTYL) capsule 10 mg  10 mg Oral TID Purcell Mouton, MD   10 mg at 08/04/23 0841   enoxaparin (LOVENOX) injection 40 mg  40 mg Subcutaneous Q24H Verdene Lennert, MD   40 mg at 08/03/23 2109   ferrous sulfate tablet 325 mg  325 mg Oral Daily Verdene Lennert, MD   325 mg at 08/04/23 6295   fidaxomicin (DIFICID) tablet 200 mg  200 mg Oral BID Verdene Lennert, MD   200 mg at 08/04/23 2841   folic acid (FOLVITE) tablet 1 mg  1 mg Oral Daily Carollee Herter, DO   1 mg at 08/04/23 3244   influenza vaccine adjuvanted (FLUAD) injection 0.5 mL  0.5 mL Intramuscular Tomorrow-1000 Carollee Herter, DO       isosorbide mononitrate (IMDUR) 24 hr tablet 120 mg  120 mg Oral BID Verdene Lennert, MD   120 mg at 08/04/23 0841   levothyroxine (SYNTHROID) tablet 88 mcg  88 mcg Oral Q0600 Verdene Lennert, MD   88 mcg at 08/04/23 0507   midodrine (PROAMATINE) tablet 10 mg  10 mg Oral TID WC Verdene Lennert, MD   10 mg at 08/04/23 0842   montelukast (SINGULAIR) tablet 10 mg  10 mg Oral QHS Verdene Lennert, MD   10 mg at 08/03/23 2108   ondansetron (ZOFRAN) tablet 4 mg  4 mg Oral Q8H PRN Verdene Lennert, MD       Or   ondansetron (ZOFRAN) injection 4 mg  4 mg Intravenous Q8H PRN Verdene Lennert, MD   4 mg at 08/03/23 1143   phosphorus (K PHOS NEUTRAL) tablet 500 mg  500 mg Oral Q4H Bari Mantis A, RPH   500 mg at 08/04/23 0842   potassium chloride SA (KLOR-CON M) CR tablet 40 mEq  40 mEq Oral Q4H Carollee Herter, DO   40 mEq at 08/04/23 0102   ranolazine (RANEXA) 12 hr tablet 500 mg  500 mg Oral BID Verdene Lennert, MD   500 mg at 08/04/23 0843   sodium chloride flush (NS) 0.9 % injection 3 mL  3 mL Intravenous Q12H Verdene Lennert, MD   3 mL at 08/03/23 2109   traZODone (DESYREL) tablet 100 mg  100 mg Oral QHS PRN Verdene Lennert, MD   100 mg at 08/03/23 2108     Discharge Medications: Please see discharge summary for a list of  discharge medications.  Relevant Imaging Results:  Relevant Lab Results:   Additional Information SS #: [id number]  Abigail Ortega E [place], LCSW

## 2023-08-04 NOTE — Assessment & Plan Note (Addendum)
08-04-2023. Will repeat iron studies. Folate levels low. Start folic acid. 08-05-2023  continue with oral iron. Based on labs, she has not been taking her iron tabs. Iron/TIBC/Ferritin/ %Sat    Component Value Date/Time   IRON 50 08/04/2023 0416   TIBC 141 (L) 08/04/2023 0416   IRONPCTSAT 35 (H) 08/04/2023 0416      Anemia Workup Lab Results  Component Value Date/Time   VITAMINB12 598 05/10/2023 01:21 PM   FOLATE 4.6 (L) 06/28/2023 06:32 AM   FERRITIN 299 05/10/2023 01:20 PM   TIBC 252 05/10/2023 01:20 PM   IRON 63 05/10/2023 01:20 PM   RETICCTPCT 2.2 05/10/2023 01:20 PM

## 2023-08-04 NOTE — Progress Notes (Signed)
PHARMACY CONSULT NOTE - FOLLOW UP  Pharmacy Consult for Electrolyte Monitoring and Replacement   Recent Labs: Potassium (mmol/L)  Date Value  08/04/2023 3.3 (L)  03/20/2014 3.4 (L)   Magnesium (mg/dL)  Date Value  11/91/4782 1.9  02/23/2014 1.7 (L)   Calcium (mg/dL)  Date Value  95/62/1308 7.4 (L)   Calcium, Total (mg/dL)  Date Value  65/78/4696 9.6   Albumin (g/dL)  Date Value  29/52/8413 2.9 (L)  11/24/2022 4.3  03/20/2014 3.4   Phosphorus (mg/dL)  Date Value  24/40/1027 1.9 (L)   Sodium (mmol/L)  Date Value  08/04/2023 133 (L)  11/24/2022 137  03/20/2014 135 (L)   Corrected Ca: 8.4     (Ca 7.4, Albumin 2.9)  Diet: reg Fluids: none Pertinent Medications: fidaxomicin (Cdiff diarrhea)  Assessment: PM is a 73 yo female who presented for follow-up due to hx of C. Difficile. They have had severe diarrhea, weight-loss and weakness. They arrived hypotensive and confused today. Pharmacy has been consulted to manage this patient's electrolytes.   Goal of Therapy:  Electrolytes WNL   Plan:  K 3.3     Scr 0.92    MD has ordered 40 mEq KCL po q4h x2  Phos 1.9     Will order KPhos neutral tabs 500 mg PO q4h x 2 Will recheck BMP, Mg and Phos with AM labs  Serigne Kubicek A, PharmD 08/04/2023 8:28 AM

## 2023-08-04 NOTE — Assessment & Plan Note (Addendum)
08-04-2023 start folic acid 1 mg daily. 08-05-2023 stable 08-06-2023 stable  08-07-2023 stable. Continue folic acid 1 mg daily.

## 2023-08-04 NOTE — TOC Initial Note (Signed)
Transition of Care [facility] - Initial/Assessment Note    Patient Details  Name: Abigail Ortega MRN: [id number] Date of Birth: [birth year]  Transition of Care [facility]) CM/SW Contact:    Liliana Cline, LCSW Phone Number: 08/04/2023, 3:04 PM  Clinical Narrative:                 CSW spoke with son Marcial Pacas who is at bedside. Patient is from home with her sister Dewayne Hatch. Timothy visits daily but works so can only provide limited support.  PT recommends SNF. Patient went to [facility] recently, Marcial Pacas thinks she was there for 10 days.  Patient and son are agreeable to more STR and prefer [facility]. CSW started the SNF work up. PASRR is pending.  Expected Discharge Plan: Skilled Nursing Facility Barriers to Discharge: Continued Medical Work up   Patient Goals and CMS Choice Patient states their goals for this hospitalization and ongoing recovery are:: SNF CMS Medicare.gov Compare Post Acute Care list provided to:: Patient Choice offered to / list presented to : Patient, Adult Children      Expected Discharge Plan and Services       Living arrangements for the past 2 months: Single Family Home                                      Prior Living Arrangements/Services Living arrangements for the past 2 months: Single Family Home Lives with:: Siblings Patient language and need for interpreter reviewed:: Yes        Need for Family Participation in Patient Care: Yes (Comment) Care giver support system in place?: Yes (comment) Current home services: DME Criminal Activity/Legal Involvement Pertinent to Current Situation/Hospitalization: No - Comment as needed  Activities of Daily Living   ADL Screening (condition at time of admission) Independently performs ADLs?: No Does the patient have a NEW difficulty with bathing/dressing/toileting/self-feeding that is expected to last >3 days?: Yes (Initiates electronic notice to provider for possible OT consult) Does the  patient have a NEW difficulty with getting in/out of bed, walking, or climbing stairs that is expected to last >3 days?: No Does the patient have a NEW difficulty with communication that is expected to last >3 days?: No Is the patient deaf or have difficulty hearing?: No Does the patient have difficulty seeing, even when wearing glasses/contacts?: No Does the patient have difficulty concentrating, remembering, or making decisions?: No  Permission Sought/Granted                  Emotional Assessment       Orientation: : Oriented to Self, Oriented to Place, Oriented to  Time, Oriented to Situation Alcohol / Substance Use: Not Applicable Psych Involvement: No (comment)  Admission diagnosis:  Diarrhea of presumed infectious origin [R19.7] Hypokalemia [E87.6] Prolonged Q-T interval on ECG [R94.31] Acute colitis [K52.9] Clostridium difficile colitis [A04.72] Patient Active Problem List   Diagnosis Date Noted   Normocytic anemia 08/04/2023   Folate deficiency 07/01/2023   Malnutrition of moderate degree 06/30/2023   Hypokalemia 06/28/2023   Acute kidney injury superimposed on chronic kidney disease (HCC) 06/28/2023   Coronary artery disease with stable angina 06/28/2023   Chronic obstructive lung disease (HCC) 06/28/2023   C. difficile colitis 06/27/2023   Dyspepsia 01/30/2023   Prediabetes 01/30/2023   Gastroesophageal reflux disease without esophagitis 01/30/2023   Other irritable bowel syndrome 01/30/2023   Cervicalgia 11/28/2022   Hemorrhoids  11/28/2022   Subacute cough 09/29/2022   COPD (chronic obstructive pulmonary disease) with acute bronchitis (HCC) 09/29/2022   Osteoporosis 10/25/2021   Coronary syndrome, acute (HCC) 08/23/2018   Anal fissure    History of colonic polyps 12/23/2016   Hyponatremia 12/21/2016   GIB (gastrointestinal bleeding) 12/12/2016   Depressive disorder 01/14/2016   Headache 01/14/2016   Joint pain 01/14/2016   Myocardial infarction (HCC)  01/14/2016   Obesity (BMI 35.0-39.9 without comorbidity) 08/20/2015   Anal pain 01/13/2015   Anxiety 12/16/2013   Breast cancer (HCC) 12/16/2013   CAD (coronary artery disease) 12/16/2013   Hyperlipidemia, unspecified 12/16/2013   Chronic hypotension 12/16/2013   Nausea 12/16/2013   Severe hypothyroidism 12/16/2013   Acquired absence of breast 03/04/2013   Chronic combined systolic and diastolic CHF (congestive heart failure) (HCC) 03/04/2013   Breast cancer of lower-inner quadrant of right female breast (HCC) 01/28/2013   PCP:  Margaretann Loveless, MD Pharmacy:   [facility] [phone] - [place], [place] - [address] [place] [address] Phone: [phone] Fax: [phone]     Social Drivers of Health (SDOH) Social History: SDOH Screenings   Food Insecurity: No Food Insecurity (08/03/2023)  Housing: Low Risk  (08/03/2023)  Transportation Needs: No Transportation Needs (08/03/2023)  Utilities: Not At Risk (08/03/2023)  Social Connections: Moderately Integrated (08/03/2023)  Tobacco Use: Medium Risk (08/02/2023)   SDOH Interventions:     Readmission Risk Interventions     No data to display

## 2023-08-05 ENCOUNTER — Encounter: Payer: Self-pay | Admitting: Internal Medicine

## 2023-08-05 DIAGNOSIS — I5042 Chronic combined systolic (congestive) and diastolic (congestive) heart failure: Secondary | ICD-10-CM | POA: Diagnosis not present

## 2023-08-05 DIAGNOSIS — E876 Hypokalemia: Secondary | ICD-10-CM | POA: Diagnosis not present

## 2023-08-05 DIAGNOSIS — I2510 Atherosclerotic heart disease of native coronary artery without angina pectoris: Secondary | ICD-10-CM | POA: Diagnosis not present

## 2023-08-05 DIAGNOSIS — A0472 Enterocolitis due to Clostridium difficile, not specified as recurrent: Secondary | ICD-10-CM | POA: Diagnosis not present

## 2023-08-05 LAB — BASIC METABOLIC PANEL
Anion gap: 8 (ref 5–15)
BUN: 7 mg/dL — ABNORMAL LOW (ref 8–23)
CO2: 21 mmol/L — ABNORMAL LOW (ref 22–32)
Calcium: 6.8 mg/dL — ABNORMAL LOW (ref 8.9–10.3)
Chloride: 105 mmol/L (ref 98–111)
Creatinine, Ser: 0.98 mg/dL (ref 0.44–1.00)
GFR, Estimated: >60 mL/min (ref >60)
Glucose, Bld: 79 mg/dL (ref 70–99)
Potassium: 4 mmol/L (ref 3.5–5.1)
Sodium: 134 mmol/L — ABNORMAL LOW (ref 135–145)

## 2023-08-05 LAB — MAGNESIUM: Magnesium: 1.8 mg/dL (ref 1.7–2.4)

## 2023-08-05 LAB — PHOSPHORUS: Phosphorus: 2.7 mg/dL (ref 2.5–4.6)

## 2023-08-05 MED ORDER — MAGNESIUM SULFATE 2 GM/50ML IV SOLN: 2.0000 g | Freq: Once | INTRAVENOUS | Status: DC

## 2023-08-05 MED ORDER — ISOSORBIDE MONONITRATE ER 30 MG PO TB24
60.0000 mg | ORAL_TABLET | Freq: Two times a day (BID) | ORAL | Status: DC
  Administered 2023-08-05 – 2023-08-20 (×28): 60 mg via ORAL
  Filled 2023-08-05 (×30): qty 2

## 2023-08-05 MED ORDER — CHOLESTYRAMINE 4 G PO PACK
4.0000 g | PACK | Freq: Two times a day (BID) | ORAL | Status: DC
  Administered 2023-08-05 – 2023-08-09 (×8): 4 g via ORAL
  Filled 2023-08-05 (×10): qty 1

## 2023-08-05 MED ORDER — GERHARDT'S BUTT CREAM
TOPICAL_CREAM | Freq: Four times a day (QID) | CUTANEOUS | Status: DC
  Administered 2023-08-08: 1 via TOPICAL
  Filled 2023-08-05 (×2): qty 60

## 2023-08-05 NOTE — TOC Progression Note (Signed)
Transition of Care [facility]) - Progression Note    Patient Details  Name: Abigail Ortega MRN: [id number] Date of Birth: 1950-11-30  Transition of Care [facility]) CM/SW Contact  Liliana Cline, LCSW Phone Number: 08/05/2023, 9:39 AM  Clinical Narrative:    Left a VM for [facility] to inquire if they can accept patient again.   Expected Discharge Plan: Skilled Nursing Facility Barriers to Discharge: Continued Medical Work up  Expected Discharge Plan and Services       Living arrangements for the past 2 months: Single Family Home                                       Social Determinants of Health (SDOH) Interventions SDOH Screenings   Food Insecurity: No Food Insecurity (08/03/2023)  Housing: Low Risk  (08/03/2023)  Transportation Needs: No Transportation Needs (08/03/2023)  Utilities: Not At Risk (08/03/2023)  Social Connections: Moderately Integrated (08/03/2023)  Tobacco Use: Medium Risk (08/02/2023)    Readmission Risk Interventions     No data to display

## 2023-08-05 NOTE — Plan of Care (Signed)

## 2023-08-05 NOTE — Progress Notes (Signed)
IV access attempted x 2, unsuccessful. VAT consult.  Cornell Barman Damiah Mcdonald

## 2023-08-05 NOTE — Progress Notes (Signed)
PHARMACY CONSULT NOTE - FOLLOW UP  Pharmacy Consult for Electrolyte Monitoring and Replacement   Recent Labs: Potassium (mmol/L)  Date Value  08/05/2023 4.0  03/20/2014 3.4 (L)   Magnesium (mg/dL)  Date Value  44/09/4740 1.8  02/23/2014 1.7 (L)   Calcium (mg/dL)  Date Value  59/56/3875 6.8 (L)   Calcium, Total (mg/dL)  Date Value  64/33/2951 9.6   Albumin (g/dL)  Date Value  88/41/6606 2.9 (L)  11/24/2022 4.3  03/20/2014 3.4   Phosphorus (mg/dL)  Date Value  30/16/0109 2.7   Sodium (mmol/L)  Date Value  08/05/2023 134 (L)  11/24/2022 137  03/20/2014 135 (L)   Corrected Ca: 7.7     (Ca 6.8, Albumin 2.9)  Diet: reg Fluids: none Pertinent Medications: fidaxomicin (Cdiff diarrhea),   Assessment: PM is a 73 yo female who presented for follow-up due to hx of C. Difficile. They have had severe diarrhea, weight-loss and weakness. They arrived hypotensive and confused today. Pharmacy has been consulted to manage this patient's electrolytes.   Goal of Therapy:  Electrolytes WNL   Plan:  Mag 1.8    Will order Magnesium sulfate 2 gm IV x1 Will recheck BMP, Mg and Phos with AM labs  Rudolph Daoust A, PharmD 08/05/2023 10:01 AM

## 2023-08-05 NOTE — Progress Notes (Signed)
PROGRESS NOTE    Abigail Ortega  OZH:[id number] DOB: 08-15-1950 DOA: 08/02/2023 PCP: Margaretann Loveless, MD  Subjective: Pt seen and examined.  Only 2 incontinent stools recorded. In last 24 hours. No description if this was a solid vs liquid stool.  CMP is NOT suggestive of diarrhea or dehydration.  Pt and family want her to go back to SNF.  Afebrile. Remains on dificid. Is not receiving any IV meds.   Hospital Course: HPI: BARABRA TIMP is a 73 y.o. female with medical history significant of recent C. difficile colitis s/p treatment with vancomycin (December 2024), CAD s/p DES, breast cancer s/p chemoradiation and mastectomy, hypothyroidism, hypertension, hyperlipidemia, osteoporosis, who presents to the ED due to diarrhea.   Abigail Ortega states that since she was discharged from SNF approximately 10 days ago, she has been experiencing persistent diarrhea with some generalized abdominal pain.  She denies any nausea or vomiting but notes poor p.o. intake.  She has been experiencing fecal incontinence.  Denies melena or hematochezia, fever/chills.She does endorse shortness of breath with exertion only, but denies any chest pain, palpitations.  She notes that shortness of breath with exertion began after her recent hospitalization, stating she just feels very weak and.   ED course: On arrival to the ED, patient was normotensive at 110/73 with heart rate of 59.  She is saturating at 98% on room air.  She was afebrile at 98.5.  Initial workup notable for hemoglobin of 8.9, platelets 533, potassium 2.6, creatinine 1.13, GFR 51.  Lactic acid within normal limits.  Patient started on IV fluids and potassium supplementation.  TRH contacted for admission.  Significant Events: Admitted 08/02/2023 with acute colitis, rule out recurrent C. diff   Significant Labs: hemoglobin of 8.9, platelets 533, potassium 2.6, creatinine 1.13, GFR 51. Lactic acid within normal limits   Significant  Imaging Studies: CXR No acute cardiopulmonary findings.   Antibiotic Therapy: Anti-infectives (From admission, onward)    Start     Dose/Rate Route Frequency Ordered Stop   08/02/23 2245  fidaxomicin (DIFICID) tablet 200 mg        200 mg Oral 2 times daily 08/02/23 2242 08/12/23 2159       Procedures:   Consultants:     Assessment and Plan: * C. difficile colitis On admission, Patient is presenting with 10 days of severe diarrhea and fecal incontinence in the setting of recent C. difficile colitis approximately 1 month prior, concerning for treatment failure versus reinfection.  Examination is nonfocal, so will hold off on imaging at this time 08-03-2023 pt's C. Diff PCR and antigen were POSITIVE. Diarrhea was present on admission so this is NOT an HAI. C. Diff toxin negative but given recent bout of c. Diff diarrhea last month that was treated with po vanco, will consider this a recurrence of C. Diff.  Continue with dificid bid x 10 days. Possible home tomorrow if no more diarrhea. 08-04-2023 only 1 diarrheal stool yesterday. PT recs SNF at discharge. Will consult TOC.  Arranged for outpatient [facility] pharmacy to obtain 10 days of dificid for patient. Currently held in inpatient pharmacy for pt to take home. WBC is normal.  08-05-2023 no evidence of dehydration on labs. Add some Questran to help bulk up stools. Continue with dificid. She is medically stable for DC. Awaiting SNF bed.    Hypokalemia On admission, In the setting of diarrhea.  EKG demonstrating QTc prolongation. S/p 60 meq replacement. 08-03-2023 K still 2.8. will give Kphos 08-04-2023  give more po kcl. Repeat EKG to document shortening of Qtc  08-05-2023 awaiting EKG that was ordered yesterday to be transmitted.  Severe hypothyroidism On most recent admission in December 2024, TSH was noted to be markedly elevated at 270.  She was treated with IV Synthroid initially and transition to p.o. 08-03-2023 TSH 89.9.  improved from 270. Continue at same dose of synthroid. 08-04-2023 stable  08-05-2023 stable  Chronic hypotension On admission, Initial concern at GI office earlier today for hypotension, however patient's blood pressure has been within normal limits since arrival to the ED. Continue home midodrine 08-03-2023 BP improved with IVF. Continue midodrine. 08-04-2023 stable. Continue home midodrine.  08-05-2023 stable  CAD (coronary artery disease) On admission, Patient reports shortness of breath on exertion but denies any chest pain with exertion.  This is likely due to deconditioning after prolonged hospitalization and rehabilitation over the last month. Continue home regimen 08-03-2023 no further SOB. 08-04-2023 stable  08-05-2023 stable  Chronic combined systolic and diastolic CHF (congestive heart failure) (HCC) On admission, Patient appears hypovolemic on examination in the setting of diarrhea.  Hold home Lasix.  Daily weights 08-03-2023 appears euvolemic. Consider restarting lasix tomorrow. 08-04-2023 pt still having diarrhea. Continue to hold lasix.  08-05-2023 pt continues to c/o of diarrhea. Continue to hold lasix.  Normocytic anemia 08-04-2023. Will repeat iron studies. Folate levels low. Start folic acid. 08-05-2023  continue with oral iron. Based on labs, she has not been taking her iron tabs. Iron/TIBC/Ferritin/ %Sat    Component Value Date/Time   IRON 50 08/04/2023 0416   TIBC 141 (L) 08/04/2023 0416   IRONPCTSAT 35 (H) 08/04/2023 0416      Anemia Workup Lab Results  Component Value Date/Time   VITAMINB12 598 05/10/2023 01:21 PM   FOLATE 4.6 (L) 06/28/2023 06:32 AM   FERRITIN 299 05/10/2023 01:20 PM   TIBC 252 05/10/2023 01:20 PM   IRON 63 05/10/2023 01:20 PM   RETICCTPCT 2.2 05/10/2023 01:20 PM    Folate deficiency 08-04-2023 start folic acid 1 mg daily.  08-05-2023 stable       DVT prophylaxis: enoxaparin (LOVENOX) injection 40 mg Start: 08/02/23  2200    Code Status: Do not attempt resuscitation (DNR) PRE-ARREST INTERVENTIONS DESIRED Family Communication: no family at bedside Disposition Plan: SNF placement Reason for continuing need for hospitalization: medically stable for DC to SNF.  Objective: Vitals:   08/04/23 0800 08/04/23 2004 08/05/23 0500 08/05/23 0524  BP: 103/60 (!) 102/55  (!) 100/55  Pulse: 86 76  74  Resp: 18 16  16   Temp: 98.4 F (36.9 C) 98.6 F (37 C)  98 F (36.7 C)  TempSrc:  Oral  Oral  SpO2: 100% 100%  100%  Weight:   65.1 kg   Height:       No intake or output data in the 24 hours ending 08/05/23 0913 Filed Weights   08/02/23 1039 08/03/23 0702 08/05/23 0500  Weight: 58 kg 61.2 kg 65.1 kg    Examination:  Physical Exam Vitals and nursing note reviewed.  Constitutional:      Comments: Chronically ill appearing  HENT:     Head: Normocephalic and atraumatic.     Nose: Nose normal.  Cardiovascular:     Rate and Rhythm: Normal rate and regular rhythm.  Pulmonary:     Effort: Pulmonary effort is normal.     Breath sounds: Normal breath sounds.  Abdominal:     General: Bowel sounds are normal.  Palpations: Abdomen is soft.  Musculoskeletal:     Right lower leg: No edema.     Left lower leg: No edema.  Skin:    General: Skin is warm and dry.     Capillary Refill: Capillary refill takes less than 2 seconds.  Neurological:     Mental Status: She is alert and oriented to person, place, and time.     Data Reviewed: I have personally reviewed following labs and imaging studies  CBC: Recent Labs  Lab 08/02/23 1042 08/03/23 0537 08/04/23 0416  WBC 7.3 5.8 6.0  NEUTROABS 5.3  --  2.9  HGB 8.9* 8.0* 8.1*  HCT 27.5* 23.4* 24.0*  MCV 96.8 92.9 94.9  PLT 533* 483* 479*   Basic Metabolic Panel: Recent Labs  Lab 08/02/23 1042 08/03/23 0537 08/04/23 0416 08/05/23 0407  NA 135 135 133* 134*  K 2.6* 2.8* 3.3* 4.0  CL 95* 101 104 105  CO2 22 25 21* 21*  GLUCOSE 80 97 79 79   BUN 13 9 6* 7*  CREATININE 1.13* 0.84 0.92 0.98  CALCIUM 8.1* 7.5* 7.4* 6.8*  MG 1.7 2.1 1.9 1.8  PHOS  --  1.1* 1.9* 2.7   GFR: Estimated Creatinine Clearance: 46 mL/min (by C-G formula based on SCr of 0.98 mg/dL). Liver Function Tests: Recent Labs  Lab 08/02/23 1042  AST 21  ALT 10  ALKPHOS 106  BILITOT 1.4*  PROT 6.3*  ALBUMIN 2.9*   Coagulation Profile: Recent Labs  Lab 08/02/23 1042  INR 1.0   Thyroid Function Tests: Recent Labs    08/02/23 1042  TSH 89.000*   Anemia Panel: Recent Labs    08/04/23 0416  TIBC 141*  IRON 50   Sepsis Labs: Recent Labs  Lab 08/02/23 1042  LATICACIDVEN 1.6    Recent Results (from the past 240 hours)  Culture, blood (Routine x 2)     Status: None (Preliminary result)   Collection Time: 08/02/23 10:42 AM   Specimen: BLOOD  Result Value Ref Range Status   Specimen Description BLOOD RIGHT [facility]  Final   Special Requests   Final    BOTTLES DRAWN AEROBIC AND ANAEROBIC Blood Culture adequate volume   Culture   Final    NO GROWTH 3 DAYS Performed at [facility], [address], [place], [place] [address]    Report Status PENDING  Incomplete  Culture, blood (Routine x 2)     Status: None (Preliminary result)   Collection Time: 08/02/23 11:54 AM   Specimen: Right Antecubital; Blood  Result Value Ref Range Status   Specimen Description RIGHT ANTECUBITAL  Final   Special Requests   Final    BOTTLES DRAWN AEROBIC AND ANAEROBIC Blood Culture results may not be optimal due to an inadequate volume of blood received in culture bottles   Culture   Final    NO GROWTH 3 DAYS Performed at [facility], [address] [place], [place] [address]    Report Status PENDING  Incomplete  C Difficile Quick Screen w PCR reflex     Status: Abnormal   Collection Time: 08/02/23  2:05 PM  Result Value Ref Range Status   C Diff antigen POSITIVE (A) NEGATIVE Final   C Diff toxin NEGATIVE NEGATIVE Final   C Diff  interpretation Results are indeterminate. See PCR results.  Final    Comment: Performed at [facility], [address] [place], [place] [address]  Gastrointestinal Panel by PCR , Stool  Status: None   Collection Time: 08/02/23  2:05 PM  Result Value Ref Range Status   Campylobacter species NOT DETECTED NOT DETECTED Final   Plesimonas shigelloides NOT DETECTED NOT DETECTED Final   Salmonella species NOT DETECTED NOT DETECTED Final   Yersinia enterocolitica NOT DETECTED NOT DETECTED Final   Vibrio species NOT DETECTED NOT DETECTED Final   Vibrio cholerae NOT DETECTED NOT DETECTED Final   Enteroaggregative E coli (EAEC) NOT DETECTED NOT DETECTED Final   Enteropathogenic E coli (EPEC) NOT DETECTED NOT DETECTED Final   Enterotoxigenic E coli (ETEC) NOT DETECTED NOT DETECTED Final   Shiga like toxin producing E coli (STEC) NOT DETECTED NOT DETECTED Final   Shigella/Enteroinvasive E coli (EIEC) NOT DETECTED NOT DETECTED Final   Cryptosporidium NOT DETECTED NOT DETECTED Final   Cyclospora cayetanensis NOT DETECTED NOT DETECTED Final   Entamoeba histolytica NOT DETECTED NOT DETECTED Final   Giardia lamblia NOT DETECTED NOT DETECTED Final   Adenovirus F40/41 NOT DETECTED NOT DETECTED Final   Astrovirus NOT DETECTED NOT DETECTED Final   Norovirus GI/GII NOT DETECTED NOT DETECTED Final   Rotavirus A NOT DETECTED NOT DETECTED Final   Sapovirus (I, II, IV, and V) NOT DETECTED NOT DETECTED Final    Comment: Performed at [facility], [address] [place], [place] [address]  C. Diff by PCR, Reflexed     Status: Abnormal   Collection Time: 08/02/23  2:05 PM  Result Value Ref Range Status   Toxigenic C. Difficile by PCR POSITIVE (A) NEGATIVE Final    Comment: Positive for toxigenic C. difficile with little to no toxin production. Only treat if clinical presentation suggests symptomatic illness. Performed at [facility], [address] [place], [place] [address]      Scheduled Meds:  aspirin EC  81 mg Oral Daily   atorvastatin  80 mg Oral Daily   cholestyramine  4 g Oral BID   clopidogrel  75 mg Oral Daily   dicyclomine  10 mg Oral TID AC   enoxaparin (LOVENOX) injection  40 mg Subcutaneous Q24H   ferrous sulfate  325 mg Oral Daily   fidaxomicin  200 mg Oral BID   folic acid  1 mg Oral Daily   influenza vaccine adjuvanted  0.5 mL Intramuscular Tomorrow-1000   isosorbide mononitrate  120 mg Oral BID   levothyroxine  88 mcg Oral Q0600   midodrine  10 mg Oral TID WC   montelukast  10 mg Oral QHS   ranolazine  500 mg Oral BID   Continuous Infusions:   LOS: 2 days   Time spent: 40 minutes  Carollee Herter, DO  [facility]  08/05/2023, 9:13 AM

## 2023-08-06 DIAGNOSIS — I5042 Chronic combined systolic (congestive) and diastolic (congestive) heart failure: Secondary | ICD-10-CM | POA: Diagnosis not present

## 2023-08-06 DIAGNOSIS — R634 Abnormal weight loss: Secondary | ICD-10-CM | POA: Diagnosis not present

## 2023-08-06 DIAGNOSIS — D649 Anemia, unspecified: Secondary | ICD-10-CM | POA: Diagnosis not present

## 2023-08-06 DIAGNOSIS — A0472 Enterocolitis due to Clostridium difficile, not specified as recurrent: Secondary | ICD-10-CM | POA: Diagnosis not present

## 2023-08-06 DIAGNOSIS — E876 Hypokalemia: Secondary | ICD-10-CM | POA: Diagnosis not present

## 2023-08-06 DIAGNOSIS — E039 Hypothyroidism, unspecified: Secondary | ICD-10-CM | POA: Diagnosis not present

## 2023-08-06 LAB — CBC WITH DIFFERENTIAL/PLATELET
Abs Immature Granulocytes: 0.7 10*3/uL — ABNORMAL HIGH (ref 0.00–0.07)
Basophils Absolute: 0.1 10*3/uL (ref 0.0–0.1)
Basophils Relative: 1 %
Eosinophils Absolute: 0.3 10*3/uL (ref 0.0–0.5)
Eosinophils Relative: 3 %
HCT: 21.8 % — ABNORMAL LOW (ref 36.0–46.0)
Hemoglobin: 7.5 g/dL — ABNORMAL LOW (ref 12.0–15.0)
Immature Granulocytes: 9 %
Lymphocytes Relative: 31 %
Lymphs Abs: 2.4 10*3/uL (ref 0.7–4.0)
MCH: 32.5 pg (ref 26.0–34.0)
MCHC: 34.4 g/dL (ref 30.0–36.0)
MCV: 94.4 fL (ref 80.0–100.0)
Monocytes Absolute: 0.5 10*3/uL (ref 0.1–1.0)
Monocytes Relative: 6 %
Neutro Abs: 3.8 10*3/uL (ref 1.7–7.7)
Neutrophils Relative %: 50 %
Platelets: 471 10*3/uL — ABNORMAL HIGH (ref 150–400)
RBC: 2.31 MIL/uL — ABNORMAL LOW (ref 3.87–5.11)
RDW: 15.9 % — ABNORMAL HIGH (ref 11.5–15.5)
Smear Review: NORMAL
WBC: 7.7 10*3/uL (ref 4.0–10.5)
nRBC: 0 % (ref 0.0–0.2)

## 2023-08-06 LAB — COMPREHENSIVE METABOLIC PANEL
ALT: 8 U/L (ref 0–44)
AST: 17 U/L (ref 15–41)
Albumin: 2.1 g/dL — ABNORMAL LOW (ref 3.5–5.0)
Alkaline Phosphatase: 74 U/L (ref 38–126)
Anion gap: 7 (ref 5–15)
BUN: 7 mg/dL — ABNORMAL LOW (ref 8–23)
CO2: 22 mmol/L (ref 22–32)
Calcium: 7.5 mg/dL — ABNORMAL LOW (ref 8.9–10.3)
Chloride: 105 mmol/L (ref 98–111)
Creatinine, Ser: 1 mg/dL (ref 0.44–1.00)
GFR, Estimated: 59 mL/min — ABNORMAL LOW (ref >60)
Glucose, Bld: 75 mg/dL (ref 70–99)
Potassium: 3.5 mmol/L (ref 3.5–5.1)
Sodium: 134 mmol/L — ABNORMAL LOW (ref 135–145)
Total Bilirubin: 0.6 mg/dL (ref 0.0–1.2)
Total Protein: 4.9 g/dL — ABNORMAL LOW (ref 6.5–8.1)

## 2023-08-06 LAB — MAGNESIUM: Magnesium: 1.5 mg/dL — ABNORMAL LOW (ref 1.7–2.4)

## 2023-08-06 MED ORDER — MAGNESIUM OXIDE -MG SUPPLEMENT 400 (240 MG) MG PO TABS
400.0000 mg | ORAL_TABLET | Freq: Two times a day (BID) | ORAL | Status: DC
  Administered 2023-08-06 – 2023-08-12 (×13): 400 mg via ORAL
  Filled 2023-08-06 (×13): qty 1

## 2023-08-06 MED ORDER — MAGNESIUM SULFATE 2 GM/50ML IV SOLN
2.0000 g | Freq: Once | INTRAVENOUS | Status: DC
  Filled 2023-08-06: qty 50

## 2023-08-06 MED ORDER — POTASSIUM CHLORIDE CRYS ER 20 MEQ PO TBCR
40.0000 meq | EXTENDED_RELEASE_TABLET | Freq: Once | ORAL | Status: AC
  Administered 2023-08-06: 40 meq via ORAL
  Filled 2023-08-06: qty 2

## 2023-08-06 NOTE — Consult Note (Signed)
[facility] Liaison Note  08/06/2023  [name] [birth year] [id number]  Location: RN Hospital Liaison screened the patient remotely at [facility].  Insurance: [facility]   Abigail Ortega is a 73 y.o. female who is a Primary Care Patient of Margaretann Loveless, MD ([facility]. The patient was screened for 30 day readmission hospitalization with noted extreme risk score for unplanned readmission risk with 2 IP/3 ED in 6 months.  The patient was assessed for potential Care Management service needs for post hospital transition for care coordination. Review of patient's electronic medical record reveals patient was admitted for C. Difficile Colitis. Pt recommended for SNF level of care currently pending bed offer for STR. Facility will continue to address pt's ongoing needs for SNF level of care.   Plan: [facility] Liaison will continue to follow progress and disposition to asess for post hospital community care coordination/management needs.  Referral request for community care coordination: pending disposition.   [facility]BCI Care Management/Population Health does not replace or interfere with any arrangements made by the Inpatient Transition of Care team.   For questions contact:   Elliot Cousin, RN, [facility] Liaison [place]   [facility], Population Health Office Hours MTWF  8:00 am-6:00 pm Direct Dial: [phone] mobile [phone] [Office toll free line] Office Hours are M-F 8:30 - 5 pm [email or web address]

## 2023-08-06 NOTE — Care Management Important Message (Signed)
Important Message  Patient Details  Name: Abigail Ortega MRN: [id number] Date of Birth: 09/10/1950   Important Message Given:  Yes - Medicare IM     Sherilyn Banker 08/06/2023, 11:16 AM

## 2023-08-06 NOTE — Assessment & Plan Note (Signed)
08-06-2023 replete with po mag oxide.

## 2023-08-06 NOTE — Consult Note (Addendum)
Midge Minium, MD [facility]  [address], [address] [place], [place] [address] Phone: [phone] Fax : [phone]  Consultation  Referring Provider:     Dr. Imogene Burn Primary Care Physician:  Margaretann Loveless, MD Primary Gastroenterologist:  Dr. Tobi Bastos         Reason for Consultation:     Questionable C. difficile  Date of Admission:  08/02/2023 Date of Consultation:  08/06/2023         HPI:   Abigail Ortega is a 73 y.o. female who reports to me that she has had diarrhea for the last few months and she is reporting that it has been present since April.  The patient has a history of invasive carcinoma of the breast with a history of anemia and a unintentional weight loss reported to be 50 pounds.  The patient was in the hospital at the end of last year for severe diarrhea and abdominal pain and had a positive C. difficile antigen and PCR.  The patient was started on vancomycin. On the 16th of this month the patient was readmitted with persistent diarrhea and generalized abdominal pain.  This was associated with fecal incontinence.  It was reported that the patient also had anemia.  The patient's hemoglobin and hematocrit have shown:  Component     Latest Ref Rng 07/02/2023 07/06/2023 07/07/2023 07/08/2023 08/02/2023 08/03/2023  Hemoglobin     12.0 - 15.0 g/dL 8.7 (L)  8.1 (L)  8.2 (L)  7.8 (L)  8.9 (L)  8.0 (L)   HCT     36.0 - 46.0 % 25.5 (L)  23.0 (L)  24.0 (L)  22.4 (L)  27.5 (L)  23.4 (L)   MCV     80.0 - 100.0 fL 89.5  90.2  92.3  91.1  96.8  92.9    Component     Latest Ref Rng 08/04/2023 08/06/2023  Hemoglobin     12.0 - 15.0 g/dL 8.1 (L)  7.5 (L)   HCT     36.0 - 46.0 % 24.0 (L)  21.8 (L)   MCV     80.0 - 100.0 fL 94.9  94.4     As can be seen the patient's MCV has been normal and the patient's iron studies have not shown iron deficiency.  Her iron studies have shown:  Component     Latest Ref Rng 05/10/2023 08/04/2023  Iron     28 - 170 ug/dL 63  50   TIBC     132 - 450  ug/dL 440  102 (L)   Saturation Ratios     10.4 - 31.8 % 25  35 (H)   UIBC     ug/dL 725  91    The patient reports to me today that she feels like her diarrhea has been gotten better.  I was notified today about this patient for consultation due to the finding of C. Difficile antigen positive with a toxin negative and a PCR showing toxigenic C. difficile.  The patient's last colonoscopy was in 2018 and she also had an upper endoscopy in 2018.  Dr. Tobi Bastos had suggested that the patient have a repeat EGD and colonoscopy at some time in the future.  She has not yet had those procedure done yet.  Past Medical History:  Diagnosis Date   Allergic rhinitis 12/31/2017   Anal fissure    Anal pain 01/13/2015   Anemia in chronic kidney disease 06/23/2019   Anemia  in chronic kidney disease 06/23/2019   Anxiety    Anxiety disorder due to medical condition 04/02/2018   Arthritis    Breast cancer [facility]) 2014   Right breast cancer - chemo, radiation and Mastectomy   CHF (congestive heart failure) (HCC) 2013   Chronic kidney disease    Clotting disorder (HCC)    blood clots   Depression    GERD (gastroesophageal reflux disease)    GI bleed    H/O heart artery stent    Headache    Heart attack (HCC) 2012   coronary stent x2 completed by Dr. Juliann Pares.   Heart attack (HCC) 04/29/2013   Hemorrhoids    Hypertension    Hypothyroidism    IBS (irritable bowel syndrome)    Malignant neoplasm of lower-inner quadrant of female breast (HCC) 01/20/2013   invasive mammary cancer, minimal 0.85 cm.  Histologic grade 1.   Persistent insomnia 04/02/2018   Personal history of chemotherapy 2014   BREAST CA   Personal history of radiation therapy 2014   BREAST CA   Secondary hyperparathyroidism of renal origin (HCC) 09/08/2020   Thyroid disease     Past Surgical History:  Procedure Laterality Date   BOTOX INJECTION N/A 03/14/2017   Procedure: BOTOX INJECTION;  Surgeon: Leafy Ro, MD;  Location: [facility]  ORS;  Service: General;  Laterality: N/A;   BOTOX INJECTION N/A 05/09/2018   Procedure: BOTOX INJECTION;  Surgeon: Leafy Ro, MD;  Location: [facility] ORS;  Service: General;  Laterality: N/A;   BREAST BIOPSY Right 2014   positive   BREAST SURGERY Right 02-21-2013   right mastectomy   CARDIAC CATHETERIZATION     COLONOSCOPY W/ BIOPSIES  09/15/2010   Colonoscopy completed by Lutricia Feil, M.D. Tubular adenoma of the ascending colon and descending colon reported up to 0.4 cm in diameter. No atypia.   COLONOSCOPY WITH PROPOFOL N/A 12/23/2016   Procedure: COLONOSCOPY WITH PROPOFOL;  Surgeon: Charlott Rakes, MD;  Location: [facility] ENDOSCOPY;  Service: Endoscopy;  Laterality: N/A;   ESOPHAGOGASTRODUODENOSCOPY N/A 12/13/2016   Procedure: ESOPHAGOGASTRODUODENOSCOPY (EGD);  Surgeon: Wyline Mood, MD;  Location: [facility] ENDOSCOPY;  Service: Endoscopy;  Laterality: N/A;   LEFT HEART CATH N/A 04/02/2020   Procedure: Left Heart Cath with Coronary Angiography;  Surgeon: Laurier Nancy, MD;  Location: [facility] INVASIVE CV LAB;  Service: Cardiovascular;  Laterality: N/A;   LEFT HEART CATH AND CORONARY ANGIOGRAPHY Left 08/29/2018   Procedure: LEFT HEART CATH AND CORONARY ANGIOGRAPHY;  Surgeon: Laurier Nancy, MD;  Location: [facility] INVASIVE CV LAB;  Service: Cardiovascular;  Laterality: Left;   MASTECTOMY Right 2014   BREAST CA   PORTA CATH INSERTION     SPHINCTEROTOMY N/A 05/09/2018   Procedure: SPHINCTEROTOMY;  Surgeon: Leafy Ro, MD;  Location: [facility] ORS;  Service: General;  Laterality: N/A;   UPPER GI ENDOSCOPY  09/15/2010     Completed by Lutricia Feil, M.D. for nausea. Normal exam reported.   VASCULAR SURGERY      Prior to Admission medications   Medication Sig Start Date End Date Taking? Authorizing Provider  acetaminophen (TYLENOL) 650 MG CR tablet Take 1,300 mg by mouth every 8 (eight) hours as needed for pain.   Yes [provider]  aspirin EC 81 MG tablet Take 81 mg by mouth daily. 08/08/18  Yes [provider]  atorvastatin (LIPITOR) 80 MG tablet TAKE 1 TABLET EVERY DAY 02/12/23  Yes Laurier Nancy, MD  clopidogrel (PLAVIX) 75 MG tablet TAKE 1 TABLET  EVERY DAY (NEED MD APPOINTMENT) 02/12/23  Yes Laurier Nancy, MD  dicyclomine (BENTYL) 10 MG capsule TAKE 1 CAPSULE EVERY 8 HOURS FOR ABDOMINAL PAIN 11/06/22  Yes Orson Eva, NP  ferrous sulfate 325 (65 FE) MG tablet Take 1 tablet (325 mg total) by mouth daily. 11/28/22 11/28/23 Yes Orson Eva, NP  fexofenadine (ALLEGRA) 60 MG tablet Take 0.5 tablets (30 mg total) by mouth 2 (two) times daily as needed (allergies.). 05/24/23  Yes Scoggins, Hospital doctor, NP  fidaxomicin (DIFICID) 200 MG TABS tablet Take 1 tablet (200 mg total) by mouth 2 (two) times daily for 10 days. 08/03/23 08/13/23 Yes Carollee Herter, DO  furosemide (LASIX) 20 MG tablet Take 1 tablet (20 mg total) by mouth daily as needed. 05/24/23  Yes Scoggins, Hospital doctor, NP  hydrocortisone 2.5 % cream APPLY PEA SIZED TO RECTAL REGION EVERY 8 HOURS AS NEEDED FOR RELIEF 11/06/22  Yes Orson Eva, NP  isosorbide mononitrate (IMDUR) 120 MG 24 hr tablet TAKE 1 TABLET TWICE DAILY 02/12/23  Yes Laurier Nancy, MD  levothyroxine (SYNTHROID) 88 MCG tablet Take 1 tablet (88 mcg total) by mouth daily before breakfast. 05/24/23  Yes Scoggins, Amber, NP  lidocaine (LIDODERM) 5 % Place 1 patch onto the skin every 12 (twelve) hours. Remove & Discard patch within 12 hours or as directed by MD 04/20/23 04/19/24 Yes Robley Fries, Lauren A, PA-C  midodrine (PROAMATINE) 10 MG tablet Take 10 mg by mouth 3 (three) times daily. 04/27/23  Yes [provider]  montelukast (SINGULAIR) 10 MG tablet TAKE 1 TABLET AT BEDTIME 01/17/23  Yes Miki Kins, FNP  ondansetron (ZOFRAN-ODT) 4 MG disintegrating tablet Take 1 tablet (4 mg total) by mouth every 6 (six) hours as needed for nausea or vomiting. 05/22/23  Yes Ward, Kristen N, DO  pantoprazole (PROTONIX) 40 MG tablet Take 1 tablet (40 mg total) by mouth daily. 05/22/23  05/21/24 Yes Ward, Layla Maw, DO  ranolazine (RANEXA) 500 MG 12 hr tablet TAKE 1 TABLET TWICE DAILY 01/17/23  Yes Laurier Nancy, MD  traZODone (DESYREL) 100 MG tablet Take 1 tablet (100 mg total) by mouth at bedtime. TAKE 1 TABLET BY MOUTH NIGHTLY AT BEDTIME AS NEEDED FOR SLEEP 05/24/23  Yes Scoggins, Hospital doctor, NP    Family History  Problem Relation Age of Onset   Breast cancer Mother 42   Cancer Father        colon     Social History   Tobacco Use   Smoking status: Former    Current packs/day: 0.00    Average packs/day: 1 pack/day for 20.0 years (20.0 ttl pk-yrs)    Types: Cigarettes    Start date: 57    Quit date: 2014    Years since quitting: 11.0   Smokeless tobacco: Never  Vaping Use   Vaping status: Never Used  Substance Use Topics   Alcohol use: No   Drug use: No    Allergies as of 08/02/2023 - Review Complete 08/02/2023  Allergen Reaction Noted   Nausea control [emetrol] Nausea And Vomiting 06/19/2017   Other  01/09/2017   Effexor [venlafaxine] Hives, Itching, and Rash 08/21/2022   Hydroxyzine Rash and Hives 12/20/2018    Review of Systems:    All systems reviewed and negative except where noted in HPI.   Physical Exam:  Vital signs in last 24 hours: Temp:  [98.4 F (36.9 C)-98.7 F (37.1 C)] 98.7 F (37.1 C) (01/20 1721) Pulse Rate:  [73-79] 73 (01/20 1721) Resp:  [16] 16 (  01/20 1721) BP: (104-117)/(68-78) 104/71 (01/20 1721) SpO2:  [99 %-100 %] 100 % (01/20 1721) Last BM Date : 08/05/23 General:   Pleasant, cooperative in NAD Head:  Normocephalic and atraumatic. Eyes:   No icterus.   Conjunctiva pink. PERRLA. Ears:  Normal auditory acuity. Neck:  Supple; no masses or thyroidomegaly Lungs: Respirations even and unlabored. Lungs clear to auscultation bilaterally.   No wheezes, crackles, or rhonchi.  Heart:  Regular rate and rhythm;  Without murmur, clicks, rubs or gallops Abdomen:  Soft, nondistended, nontender. Normal bowel sounds. No appreciable  masses or hepatomegaly.  No rebound or guarding.  Rectal:  Not performed. Msk:  Symmetrical without gross deformities.    Extremities:  Without edema, cyanosis or clubbing. Neurologic:  Alert and oriented x3;  grossly normal neurologically. Skin:  Intact without significant lesions or rashes. Cervical Nodes:  No significant cervical adenopathy. Psych:  Alert and cooperative. Normal affect.  LAB RESULTS: Recent Labs    08/04/23 0416 08/06/23 0526  WBC 6.0 7.7  HGB 8.1* 7.5*  HCT 24.0* 21.8*  PLT 479* 471*   BMET Recent Labs    08/04/23 0416 08/05/23 0407 08/06/23 0526  NA 133* 134* 134*  K 3.3* 4.0 3.5  CL 104 105 105  CO2 21* 21* 22  GLUCOSE 79 79 75  BUN 6* 7* 7*  CREATININE 0.92 0.98 1.00  CALCIUM 7.4* 6.8* 7.5*   LFT Recent Labs    08/06/23 0526  PROT 4.9*  ALBUMIN 2.1*  AST 17  ALT 8  ALKPHOS 74  BILITOT 0.6   PT/INR No results for input(s): "LABPROT", "INR" in the last 72 hours.  STUDIES: No results found.    Impression / Plan:   Assessment: Principal Problem:   C. difficile colitis Active Problems:   Chronic combined systolic and diastolic CHF (congestive heart failure) (HCC)   CAD (coronary artery disease)   Chronic hypotension   Hypokalemia   Severe hypothyroidism   Folate deficiency   Normocytic anemia   Hypomagnesemia   Abigail Ortega is a 73 y.o. y/o female with a consultation for C. difficile colitis.  As noted above the patient states that her diarrhea is somewhat better.  She does report that she continues to have loose bowel movements but it has been less frequent.  This was confirmed with the nurse taking care of the patient.  Plan:  This patient does need a GI workup which should include an EGD and colonoscopy for the weight loss.  The patient had a hiatal hernia found on an upper endoscopy 2018 and 1 polyp removed back in 2018 although the prep was reported as suboptimal.  I do not believe the patient should undergo a EGD  and colonoscopy until the issue of her C. difficile is resolved with the help of infectious disease.  I will defer treatment for this patient's infectious versus carrier state to ID for any further workup or treatment.  When it is deemed reasonable to undergo an EGD and colonoscopy for her weight loss please contact me if the procedures are desired while the patient is in house.  Otherwise the patient should have them as an outpatient as previously planned.  As noted above the patient does not have iron deficiency anemia and her anemia is of another etiology with her normal iron studies and normal MCV.  I will sign off.  Please call if any further GI concerns or questions.  We would like to thank you for the opportunity to participate  in the care of Abigail Ortega.   Thank you for involving me in the care of this patient.      LOS: 3 days   Midge Minium, MD, MD. Clementeen Graham 08/06/2023, 5:44 PM,  Pager [phone] 7am-5pm  Check AMION for 5pm -7am coverage and on weekends   Note: This dictation was prepared with Dragon dictation along with smaller phrase technology. Any transcriptional errors that result from this process are unintentional.

## 2023-08-06 NOTE — TOC Progression Note (Signed)
Transition of Care [facility]) - Progression Note    Patient Details  Name: Abigail Ortega MRN: [id number] Date of Birth: 1950-12-29  Transition of Care [facility]) CM/SW Contact  Garret Reddish, RN Phone Number: 08/06/2023, 4:23 PM  Clinical Narrative:    Chart reviewed.  I have spoken with [place] with [facility].  Dorathy Daft informs me that she will review patient for admission. Dorathy Daft reports that patient will be in co-pay days if she returns to the facility.  She informs me that patient will have to pay 203 per day to return back to the facility.  Patient will be in co-pay days even if [facility] is unable to accept.  I have left a voice message for patient's son Abigail Ortega to inform him of the above information.    Patient continues to have diarrhea.  I and D and GI consulted.   TOC will continue to follow for discharge planning.     Expected Discharge Plan: Skilled Nursing Facility Barriers to Discharge: Continued Medical Work up  Expected Discharge Plan and Services       Living arrangements for the past 2 months: Single Family Home                                       Social Determinants of Health (SDOH) Interventions SDOH Screenings   Food Insecurity: No Food Insecurity (08/03/2023)  Housing: Low Risk  (08/03/2023)  Transportation Needs: No Transportation Needs (08/03/2023)  Utilities: Not At Risk (08/03/2023)  Social Connections: Moderately Integrated (08/03/2023)  Tobacco Use: Medium Risk (08/02/2023)    Readmission Risk Interventions     No data to display

## 2023-08-06 NOTE — Plan of Care (Signed)

## 2023-08-06 NOTE — Plan of Care (Signed)

## 2023-08-06 NOTE — Progress Notes (Addendum)
PHARMACY CONSULT NOTE - FOLLOW UP  Pharmacy Consult for Electrolyte Monitoring and Replacement   Recent Labs: Potassium (mmol/L)  Date Value  08/06/2023 3.5  03/20/2014 3.4 (L)   Magnesium (mg/dL)  Date Value  94/85/4627 1.5 (L)  02/23/2014 1.7 (L)   Calcium (mg/dL)  Date Value  03/50/0938 7.5 (L)   Calcium, Total (mg/dL)  Date Value  18/29/9371 9.6   Albumin (g/dL)  Date Value  69/67/8938 2.1 (L)  11/24/2022 4.3  03/20/2014 3.4   Phosphorus (mg/dL)  Date Value  05/02/5101 2.7   Sodium (mmol/L)  Date Value  08/06/2023 134 (L)  11/24/2022 137  03/20/2014 135 (L)   Corrected Ca: 9.0     Diet: reg Fluids: none Pertinent Medications: fidaxomicin (Cdiff diarrhea),   Assessment: PM is a 73 yo female who presented for follow-up due to hx of C. Difficile. They have had severe diarrhea, weight-loss and weakness. They arrived hypotensive and confused today. Pharmacy has been consulted to manage this patient's electrolytes.   Goal of Therapy:  Electrolytes WNL   Plan:  Mag 1.5,  Will order Magnesium sulfate 2 gm IV x1 Will recheck BMP and Mg with AM labs  Merryl Hacker, PharmD 08/06/2023 7:47 AM

## 2023-08-06 NOTE — Progress Notes (Addendum)
Mobility Specialist - Progress Note   08/06/23 1400  Mobility  Activity Ambulated with assistance in room;Dangled on edge of bed  Level of Assistance Standby assist, set-up cues, supervision of patient - no hands on  Assistive Device Front wheel walker  Distance Ambulated (ft) 20 ft  Activity Response Tolerated well  Mobility visit 1 Mobility  Mobility Specialist Start Time (ACUTE ONLY) 1010  Mobility Specialist Stop Time (ACUTE ONLY) 1035  Mobility Specialist Time Calculation (min) (ACUTE ONLY) 25 min    Post-mobility: 83 HR, 100% SpO2   Pt lying in bed upon arrival, utilizing RA. Pt agreeable to activity. Completed bed mobility with supervision and single UE use. Pt dangled EOB ~ 10 minutes to finish drinking QUESTRAN.  Good sitting posture. STS with minA and extra time to gain balance. Ambulated in room x2 with minG. Distance limited d/t fatigue. Pt reports feeling "short-winded and really exhausted" also reporting stiffness in BLE that did improve with distance. Pt returned to bed with alarm set, needs in reach.    Filiberto Pinks Mobility Specialist 08/06/23, 2:14 PM

## 2023-08-06 NOTE — Progress Notes (Signed)
PT Cancellation Note  Patient Details Name: Abigail Ortega MRN: [id number] DOB: [birth year]   Cancelled Treatment:     Pt seen by mobility team earlier and ambulated 11ft with RW. Currently resting in bed and declining skilled PT session due to continued bowel incontinence. Therapist offered "in room" session. Pt declined. Will re-attempt next available date/time per POC.   Jannet Askew 08/06/2023, 4:01 PM

## 2023-08-06 NOTE — Progress Notes (Signed)
   Have consulted GI and ID for persistent diarrhea with C. Diff + PCR but negative C. Diff toxin  Carollee Herter, DO [facility]

## 2023-08-06 NOTE — Progress Notes (Signed)
PROGRESS NOTE    Abigail Ortega  KGM:[id number] DOB: [birth year] DOA: 08/02/2023 PCP: Margaretann Loveless, MD  Subjective: Pt seen and examined.  Pt still having diarrhea. Although she ordered turnip greens for a meal yesterday. Pt advised to avoid high fiber diet. Diet change to low residue diet.  Remains afebrile. Cholestyramine added yesterday.   Hospital Course: HPI: Abigail Ortega is a 73 y.o. female with medical history significant of recent C. difficile colitis s/p treatment with vancomycin (December 2024), CAD s/p DES, breast cancer s/p chemoradiation and mastectomy, hypothyroidism, hypertension, hyperlipidemia, osteoporosis, who presents to the ED due to diarrhea.   Abigail Ortega states that since she was discharged from SNF approximately 10 days ago, she has been experiencing persistent diarrhea with some generalized abdominal pain.  She denies any nausea or vomiting but notes poor p.o. intake.  She has been experiencing fecal incontinence.  Denies melena or hematochezia, fever/chills.She does endorse shortness of breath with exertion only, but denies any chest pain, palpitations.  She notes that shortness of breath with exertion began after her recent hospitalization, stating she just feels very weak and.   ED course: On arrival to the ED, patient was normotensive at 110/73 with heart rate of 59.  She is saturating at 98% on room air.  She was afebrile at 98.5.  Initial workup notable for hemoglobin of 8.9, platelets 533, potassium 2.6, creatinine 1.13, GFR 51.  Lactic acid within normal limits.  Patient started on IV fluids and potassium supplementation.  [facility] contacted for admission.  Significant Events: Admitted 08/02/2023 with acute colitis, rule out recurrent C. diff   Significant Labs: hemoglobin of 8.9, platelets 533, potassium 2.6, creatinine 1.13, GFR 51. Lactic acid within normal limits   Significant Imaging Studies: CXR No acute cardiopulmonary findings.    Antibiotic Therapy: Anti-infectives (From admission, onward)    Start     Dose/Rate Route Frequency Ordered Stop   08/02/23 2245  fidaxomicin (DIFICID) tablet 200 mg        200 mg Oral 2 times daily 08/02/23 2242 08/12/23 2159       Procedures:   Consultants:     Assessment and Plan: * C. difficile colitis On admission, Patient is presenting with 10 days of severe diarrhea and fecal incontinence in the setting of recent C. difficile colitis approximately 1 month prior, concerning for treatment failure versus reinfection.  Examination is nonfocal, so will hold off on imaging at this time 08-03-2023 pt's C. Diff PCR and antigen were POSITIVE. Diarrhea was present on admission so this is NOT an HAI. C. Diff toxin negative but given recent bout of c. Diff diarrhea last month that was treated with po vanco, will consider this a recurrence of C. Diff.  Continue with dificid bid x 10 days. Possible home tomorrow if no more diarrhea. 08-04-2023 only 1 diarrheal stool yesterday. PT recs SNF at discharge. Will consult TOC.  Arranged for outpatient [facility] pharmacy to obtain 10 days of dificid for patient. Currently held in inpatient pharmacy for pt to take home. WBC is normal. 08-05-2023 no evidence of dehydration on labs. Add some Questran to help bulk up stools. Continue with dificid. She is medically stable for DC. Awaiting SNF bed.  08-06-2023 still with diarrhea. 3 charts diarrhea stools yesterday. No abd distension or pain. Has been on dificid for 4 days now. On cholestyramine for 24 hours. Considering add po vanco qid to regimen. Po vanco worked last month for her c. Diff infection.  Hypokalemia On admission, In the setting of diarrhea.  EKG demonstrating QTc prolongation. S/p 60 meq replacement. 08-03-2023 K still 2.8. will give Kphos 08-04-2023 give more po kcl. Repeat EKG to document shortening of Qtc 08-05-2023 awaiting EKG that was ordered yesterday to be  transmitted.  08-06-2023 stable  Severe hypothyroidism On most recent admission in December 2024, TSH was noted to be markedly elevated at 270.  She was treated with IV Synthroid initially and transition to p.o. 08-03-2023 TSH 89.9. improved from 270. Continue at same dose of synthroid. 08-04-2023 stable 08-05-2023 stable  08-06-2023 stable  Chronic hypotension On admission, Initial concern at GI office earlier today for hypotension, however patient's blood pressure has been within normal limits since arrival to the ED. Continue home midodrine 08-03-2023 BP improved with IVF. Continue midodrine. 08-04-2023 stable. Continue home midodrine. 08-05-2023 stable  08-06-2023 stable  CAD (coronary artery disease) On admission, Patient reports shortness of breath on exertion but denies any chest pain with exertion.  This is likely due to deconditioning after prolonged hospitalization and rehabilitation over the last month. Continue home regimen 08-03-2023 no further SOB. 08-04-2023 stable 08-05-2023 stable  08-06-2023 stable  Chronic combined systolic and diastolic CHF (congestive heart failure) (HCC) On admission, Patient appears hypovolemic on examination in the setting of diarrhea.  Hold home Lasix.  Daily weights 08-03-2023 appears euvolemic. Consider restarting lasix tomorrow. 08-04-2023 pt still having diarrhea. Continue to hold lasix. 08-05-2023 pt continues to c/o of diarrhea. Continue to hold lasix.  08-06-2023 euvolemic.  Hypomagnesemia 08-06-2023 replete with po mag oxide.  Normocytic anemia 08-04-2023. Will repeat iron studies. Folate levels low. Start folic acid. 08-05-2023  continue with oral iron. Based on labs, she has not been taking her iron tabs. Iron/TIBC/Ferritin/ %Sat    Component Value Date/Time   IRON 50 08/04/2023 0416   TIBC 141 (L) 08/04/2023 0416   IRONPCTSAT 35 (H) 08/04/2023 0416      Anemia Workup Lab Results  Component Value Date/Time    VITAMINB12 598 05/10/2023 01:21 PM   FOLATE 4.6 (L) 06/28/2023 06:32 AM   FERRITIN 299 05/10/2023 01:20 PM   TIBC 252 05/10/2023 01:20 PM   IRON 63 05/10/2023 01:20 PM   RETICCTPCT 2.2 05/10/2023 01:20 PM    Folate deficiency 08-04-2023 start folic acid 1 mg daily. 08-05-2023 stable  08-06-2023 stable   DVT prophylaxis: enoxaparin (LOVENOX) injection 40 mg Start: 08/02/23 2200    Code Status: Do not attempt resuscitation (DNR) PRE-ARREST INTERVENTIONS DESIRED Family Communication: no family at bedside Disposition Plan: SNF placement. Reason for continuing need for hospitalization: medically stable. Not receiving on IV meds.  Objective: Vitals:   08/05/23 1702 08/05/23 1940 08/06/23 0448 08/06/23 0826  BP: 119/69 115/68 107/72 117/78  Pulse: 75 76 79 73  Resp: 16 16 16 16   Temp: 99.3 F (37.4 C) 98.5 F (36.9 C) 98.4 F (36.9 C) 98.6 F (37 C)  TempSrc: Oral Oral Oral Oral  SpO2: 99% 99% 100% 99%  Weight:      Height:       No intake or output data in the 24 hours ending 08/06/23 0942 Filed Weights   08/02/23 1039 08/03/23 0702 08/05/23 0500  Weight: 58 kg 61.2 kg 65.1 kg    Examination:  Physical Exam Vitals and nursing note reviewed.  Constitutional:      General: She is not in acute distress.    Appearance: She is not toxic-appearing or diaphoretic.  HENT:     Head: Normocephalic and atraumatic.  Nose: Nose normal.  Cardiovascular:     Rate and Rhythm: Normal rate and regular rhythm.  Pulmonary:     Effort: Pulmonary effort is normal.     Breath sounds: Normal breath sounds.  Abdominal:     General: Abdomen is flat. Bowel sounds are normal. There is no distension.     Palpations: Abdomen is soft.     Tenderness: There is no abdominal tenderness. There is no guarding or rebound.  Musculoskeletal:     Right lower leg: No edema.     Left lower leg: No edema.  Skin:    General: Skin is warm and dry.     Capillary Refill: Capillary refill takes less  than 2 seconds.  Neurological:     Mental Status: She is alert and oriented to person, place, and time.    Data Reviewed: I have personally reviewed following labs and imaging studies  CBC: Recent Labs  Lab 08/02/23 1042 08/03/23 0537 08/04/23 0416 08/06/23 0526  WBC 7.3 5.8 6.0 7.7  NEUTROABS 5.3  --  2.9 3.8  HGB 8.9* 8.0* 8.1* 7.5*  HCT 27.5* 23.4* 24.0* 21.8*  MCV 96.8 92.9 94.9 94.4  PLT 533* 483* 479* 471*   Basic Metabolic Panel: Recent Labs  Lab 08/02/23 1042 08/03/23 0537 08/04/23 0416 08/05/23 0407 08/06/23 0526  NA 135 135 133* 134* 134*  K 2.6* 2.8* 3.3* 4.0 3.5  CL 95* 101 104 105 105  CO2 22 25 21* 21* 22  GLUCOSE 80 97 79 79 75  BUN 13 9 6* 7* 7*  CREATININE 1.13* 0.84 0.92 0.98 1.00  CALCIUM 8.1* 7.5* 7.4* 6.8* 7.5*  MG 1.7 2.1 1.9 1.8 1.5*  PHOS  --  1.1* 1.9* 2.7  --    GFR: Estimated Creatinine Clearance: 45.1 mL/min (by C-G formula based on SCr of 1 mg/dL). Liver Function Tests: Recent Labs  Lab 08/02/23 1042 08/06/23 0526  AST 21 17  ALT 10 8  ALKPHOS 106 74  BILITOT 1.4* 0.6  PROT 6.3* 4.9*  ALBUMIN 2.9* 2.1*   Coagulation Profile: Recent Labs  Lab 08/02/23 1042  INR 1.0   Anemia Panel: Recent Labs    08/04/23 0416  TIBC 141*  IRON 50   Sepsis Labs: Recent Labs  Lab 08/02/23 1042  LATICACIDVEN 1.6    Recent Results (from the past 240 hours)  Culture, blood (Routine x 2)     Status: None (Preliminary result)   Collection Time: 08/02/23 10:42 AM   Specimen: BLOOD  Result Value Ref Range Status   Specimen Description BLOOD RIGHT [facility]  Final   Special Requests   Final    BOTTLES DRAWN AEROBIC AND ANAEROBIC Blood Culture adequate volume   Culture   Final    NO GROWTH 3 DAYS Performed at [facility], [address] [place], [place] [address]    Report Status PENDING  Incomplete  Culture, blood (Routine x 2)     Status: None (Preliminary result)   Collection Time: 08/02/23 11:54 AM   Specimen: Right  Antecubital; Blood  Result Value Ref Range Status   Specimen Description RIGHT ANTECUBITAL  Final   Special Requests   Final    BOTTLES DRAWN AEROBIC AND ANAEROBIC Blood Culture results may not be optimal due to an inadequate volume of blood received in culture bottles   Culture   Final    NO GROWTH 3 DAYS Performed at [facility], [address], [place], [place] [address]  Report Status PENDING  Incomplete  C Difficile Quick Screen w PCR reflex     Status: Abnormal   Collection Time: 08/02/23  2:05 PM  Result Value Ref Range Status   C Diff antigen POSITIVE (A) NEGATIVE Final   C Diff toxin NEGATIVE NEGATIVE Final   C Diff interpretation Results are indeterminate. See PCR results.  Final    Comment: Performed at [facility], [address] [place], [place] [address]  Gastrointestinal Panel by PCR , Stool     Status: None   Collection Time: 08/02/23  2:05 PM  Result Value Ref Range Status   Campylobacter species NOT DETECTED NOT DETECTED Final   Plesimonas shigelloides NOT DETECTED NOT DETECTED Final   Salmonella species NOT DETECTED NOT DETECTED Final   Yersinia enterocolitica NOT DETECTED NOT DETECTED Final   Vibrio species NOT DETECTED NOT DETECTED Final   Vibrio cholerae NOT DETECTED NOT DETECTED Final   Enteroaggregative E coli (EAEC) NOT DETECTED NOT DETECTED Final   Enteropathogenic E coli (EPEC) NOT DETECTED NOT DETECTED Final   Enterotoxigenic E coli (ETEC) NOT DETECTED NOT DETECTED Final   Shiga like toxin producing E coli (STEC) NOT DETECTED NOT DETECTED Final   Shigella/Enteroinvasive E coli (EIEC) NOT DETECTED NOT DETECTED Final   Cryptosporidium NOT DETECTED NOT DETECTED Final   Cyclospora cayetanensis NOT DETECTED NOT DETECTED Final   Entamoeba histolytica NOT DETECTED NOT DETECTED Final   Giardia lamblia NOT DETECTED NOT DETECTED Final   Adenovirus F40/41 NOT DETECTED NOT DETECTED Final   Astrovirus NOT DETECTED NOT DETECTED Final    Norovirus GI/GII NOT DETECTED NOT DETECTED Final   Rotavirus A NOT DETECTED NOT DETECTED Final   Sapovirus (I, II, IV, and V) NOT DETECTED NOT DETECTED Final    Comment: Performed at [facility], [address] [place], [place] [address]  C. Diff by PCR, Reflexed     Status: Abnormal   Collection Time: 08/02/23  2:05 PM  Result Value Ref Range Status   Toxigenic C. Difficile by PCR POSITIVE (A) NEGATIVE Final    Comment: Positive for toxigenic C. difficile with little to no toxin production. Only treat if clinical presentation suggests symptomatic illness. Performed at [facility] [address] [place], [place] [address]     Scheduled Meds:  aspirin EC  81 mg Oral Daily   atorvastatin  80 mg Oral Daily   cholestyramine  4 g Oral BID   clopidogrel  75 mg Oral Daily   dicyclomine  10 mg Oral TID AC   enoxaparin (LOVENOX) injection  40 mg Subcutaneous Q24H   ferrous sulfate  325 mg Oral Daily   fidaxomicin  200 mg Oral BID   folic acid  1 mg Oral Daily   Gerhardt's butt cream   Topical QID   influenza vaccine adjuvanted  0.5 mL Intramuscular Tomorrow-1000   isosorbide mononitrate  60 mg Oral BID   levothyroxine  88 mcg Oral Q0600   magnesium oxide  400 mg Oral BID   midodrine  10 mg Oral TID WC   montelukast  10 mg Oral QHS   potassium chloride  40 mEq Oral Once   ranolazine  500 mg Oral BID   Continuous Infusions:   LOS: 3 days   Time spent: 40 minutes  Carollee Herter, DO  [facility]  08/06/2023, 9:42 AM

## 2023-08-06 NOTE — Consult Note (Incomplete)
NAME: Abigail Ortega  DOB: [birth year]  MRN: [id number]  Date/Time: 08/06/2023 12:12 PM  REQUESTING PROVIDER: Dr.Chen Subjective:  REASON FOR CONSULT: Cdiff diarrhea ? 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 difficile positive antigen and PCR and was started on p.o. vancomycin.  She also had severe hypothyroidism with elevated TSH and low free T4 she initially received Synthroid IV and then transition to p.o. TSH now presents to the hospital with diarrhea after being seen in the GI clinic.  A repeat stool test was done and that was positive for antigen of seated but negative for toxin and positive for PCR. I am asked to see the patient for the persistent C. difficile infection Pt says she has had diarrhea for the past 9 months  08/02/23  BP 121/74  Temp 98.3 F (36.8 C)  Pulse Rate 72  Resp 17  SpO2 98 %  Weight 127 lb 13.9 oz    Latest Reference Range & Units 08/02/23  WBC 4.0 - 10.5 K/uL 7.3  Hemoglobin 12.0 - 15.0 g/dL 8.9 (L)  HCT 86.5 - 78.4 % 27.5 (L)  Platelets 150 - 400 K/uL 533 (H)  Creatinine 0.44 - 1.00 mg/dL 6.96 (H)    Past Medical History:  Diagnosis Date   Allergic rhinitis 12/31/2017   Anal fissure    Anal pain 01/13/2015   Anemia in chronic kidney disease 06/23/2019   Anemia in chronic kidney disease 06/23/2019   Anxiety    Anxiety disorder due to medical condition 04/02/2018   Arthritis    Breast cancer (HCC) 2014   Right breast cancer - chemo, radiation and Mastectomy   CHF  (congestive heart failure) (HCC) 2013   Chronic kidney disease    Clotting disorder (HCC)    blood clots   Depression    GERD (gastroesophageal reflux disease)    GI bleed    H/O heart artery stent    Headache    Heart attack (HCC) 2012   coronary stent x2 completed by Dr. Juliann Pares.   Heart attack (HCC) 04/29/2013   Hemorrhoids    Hypertension    Hypothyroidism    IBS (irritable bowel syndrome)    Malignant neoplasm of lower-inner quadrant of female breast (HCC) 01/20/2013   invasive mammary cancer, minimal 0.85 cm.  Histologic grade 1.   Persistent insomnia 04/02/2018   Personal history of chemotherapy 2014   BREAST CA   Personal history of radiation therapy 2014   BREAST CA   Secondary hyperparathyroidism of renal origin (HCC) 09/08/2020   Thyroid disease     Past Surgical History:  Procedure Laterality Date   BOTOX INJECTION N/A 03/14/2017   Procedure: BOTOX INJECTION;  Surgeon: Leafy Ro, MD;  Location: [facility] ORS;  Service: General;  Laterality: N/A;   BOTOX INJECTION N/A 05/09/2018   Procedure: BOTOX INJECTION;  Surgeon: Leafy Ro, MD;  Location: [facility] ORS;  Service: General;  Laterality: N/A;   BREAST BIOPSY Right 2014   positive   BREAST SURGERY Right 02-21-2013   right mastectomy   CARDIAC  CATHETERIZATION     COLONOSCOPY W/ BIOPSIES  09/15/2010   Colonoscopy completed by Lutricia Feil, M.D. Tubular adenoma of the ascending colon and descending colon reported up to 0.4 cm in diameter. No atypia.   COLONOSCOPY WITH PROPOFOL N/A 12/23/2016   Procedure: COLONOSCOPY WITH PROPOFOL;  Surgeon: Charlott Rakes, MD;  Location: [facility] ENDOSCOPY;  Service: Endoscopy;  Laterality: N/A;   ESOPHAGOGASTRODUODENOSCOPY N/A 12/13/2016   Procedure: ESOPHAGOGASTRODUODENOSCOPY (EGD);  Surgeon: Wyline Mood, MD;  Location: [facility] ENDOSCOPY;  Service: Endoscopy;  Laterality: N/A;   LEFT HEART CATH N/A 04/02/2020   Procedure: Left Heart Cath with Coronary Angiography;  Surgeon: Laurier Nancy,  MD;  Location: [facility] INVASIVE CV LAB;  Service: Cardiovascular;  Laterality: N/A;   LEFT HEART CATH AND CORONARY ANGIOGRAPHY Left 08/29/2018   Procedure: LEFT HEART CATH AND CORONARY ANGIOGRAPHY;  Surgeon: Laurier Nancy, MD;  Location: [facility] INVASIVE CV LAB;  Service: Cardiovascular;  Laterality: Left;   MASTECTOMY Right 2014   BREAST CA   PORTA CATH INSERTION     SPHINCTEROTOMY N/A 05/09/2018   Procedure: SPHINCTEROTOMY;  Surgeon: Leafy Ro, MD;  Location: [facility] ORS;  Service: General;  Laterality: N/A;   UPPER GI ENDOSCOPY  09/15/2010     Completed by Lutricia Feil, M.D. for nausea. Normal exam reported.   VASCULAR SURGERY      Social History   Socioeconomic History   Marital status: Divorced    Spouse name: Not on file   Number of children: Not on file   Years of education: Not on file   Highest education level: Not on file  Occupational History   Not on file  Tobacco Use   Smoking status: Former    Current packs/day: 0.00    Average packs/day: 1 pack/day for 20.0 years (20.0 ttl pk-yrs)    Types: Cigarettes    Start date: 55    Quit date: 2014    Years since quitting: 11.0   Smokeless tobacco: Never  Vaping Use   Vaping status: Never Used  Substance and Sexual Activity   Alcohol use: No   Drug use: No   Sexual activity: Not Currently  Other Topics Concern   Not on file  Social History Narrative   Not on file   Social Drivers of Health   Financial Resource Strain: Not on file  Food Insecurity: No Food Insecurity (08/03/2023)   Hunger Vital Sign    Worried About Running Out of Food in the Last Year: Never true    Ran Out of Food in the Last Year: Never true  Transportation Needs: No Transportation Needs (08/03/2023)   PRAPARE - Administrator, Civil Service (Medical): No    Lack of Transportation (Non-Medical): No  Physical Activity: Not on file  Stress: Not on file  Social Connections: Moderately Integrated (08/03/2023)   Social Connection and  Isolation Panel [NHANES]    Frequency of Communication with Friends and Family: More than three times a week    Frequency of Social Gatherings with Friends and Family: More than three times a week    Attends Religious Services: 1 to 4 times per year    Active Member of [employer] or Organizations: Yes    Attends Banker Meetings: 1 to 4 times per year    Marital Status: Divorced  Intimate Partner Violence: Not At Risk (08/03/2023)   Humiliation, Afraid, Rape, and Kick questionnaire    Fear of Current or Ex-Partner: No    Emotionally Abused:  No    Physically Abused: No    Sexually Abused: No    Family History  Problem Relation Age of Onset   Breast cancer Mother 20   Cancer Father        colon   Allergies  Allergen Reactions   Nausea Control [Emetrol] Nausea And Vomiting   Other     Note: burning, nausea with XR 75 mg   Effexor [Venlafaxine] Hives, Itching and Rash   Hydroxyzine Rash and Hives   I? Current Facility-Administered Medications  Medication Dose Route Frequency Provider Last Rate Last Admin   acetaminophen (TYLENOL) tablet 650 mg  650 mg Oral Q6H PRN Verdene Lennert, MD   650 mg at 08/04/23 1824   Or   acetaminophen (TYLENOL) suppository 650 mg  650 mg Rectal Q6H PRN Verdene Lennert, MD       aspirin EC tablet 81 mg  81 mg Oral Daily Verdene Lennert, MD   81 mg at 08/06/23 0839   atorvastatin (LIPITOR) tablet 80 mg  80 mg Oral Daily Verdene Lennert, MD   80 mg at 08/06/23 1610   cholestyramine (QUESTRAN) packet 4 g  4 g Oral BID Carollee Herter, DO   4 g at 08/06/23 0840   clopidogrel (PLAVIX) tablet 75 mg  75 mg Oral Daily Verdene Lennert, MD   75 mg at 08/06/23 9604   dicyclomine (BENTYL) capsule 10 mg  10 mg Oral TID AC Verdene Lennert, MD   10 mg at 08/06/23 1153   enoxaparin (LOVENOX) injection 40 mg  40 mg Subcutaneous Q24H Verdene Lennert, MD   40 mg at 08/05/23 2126   ferrous sulfate tablet 325 mg  325 mg Oral Daily Verdene Lennert, MD   325 mg at 08/06/23  5409   fidaxomicin (DIFICID) tablet 200 mg  200 mg Oral BID Verdene Lennert, MD   200 mg at 08/06/23 8119   folic acid (FOLVITE) tablet 1 mg  1 mg Oral Daily Carollee Herter, DO   1 mg at 08/06/23 1478   Gerhardt's butt cream   Topical QID Carollee Herter, DO   Given at 08/06/23 1032   influenza vaccine adjuvanted (FLUAD) injection 0.5 mL  0.5 mL Intramuscular Tomorrow-1000 Carollee Herter, DO       isosorbide mononitrate (IMDUR) 24 hr tablet 60 mg  60 mg Oral BID Carollee Herter, DO   60 mg at 08/06/23 2956   levothyroxine (SYNTHROID) tablet 88 mcg  88 mcg Oral Q0600 Verdene Lennert, MD   88 mcg at 08/06/23 2130   magnesium oxide (MAG-OX) tablet 400 mg  400 mg Oral BID Carollee Herter, DO   400 mg at 08/06/23 1153   midodrine (PROAMATINE) tablet 10 mg  10 mg Oral TID WC Verdene Lennert, MD   10 mg at 08/06/23 1153   montelukast (SINGULAIR) tablet 10 mg  10 mg Oral QHS Verdene Lennert, MD   10 mg at 08/05/23 2126   ondansetron (ZOFRAN) tablet 4 mg  4 mg Oral Q8H PRN Verdene Lennert, MD   4 mg at 08/04/23 1514   ranolazine (RANEXA) 12 hr tablet 500 mg  500 mg Oral BID Verdene Lennert, MD   500 mg at 08/06/23 8657   traZODone (DESYREL) tablet 100 mg  100 mg Oral QHS PRN Verdene Lennert, MD   100 mg at 08/03/23 2108     Abtx:  Anti-infectives (From admission, onward)    Start     Dose/Rate Route Frequency Ordered Stop   08/03/23 0000  fidaxomicin (  DIFICID) 200 MG TABS tablet        200 mg Oral 2 times daily 08/03/23 1357 08/13/23 2359   08/02/23 2245  fidaxomicin (DIFICID) tablet 200 mg        200 mg Oral 2 times daily 08/02/23 2242 08/12/23 1959       REVIEW OF SYSTEMS:  Const: negative fever, negative chills, +++ weight loss Eyes: negative diplopia or visual changes, negative eye pain ENT: negative coryza, negative sore throat Resp: negative cough, hemoptysis, dyspnea Cards: negative for chest pain, palpitations, l+lower extremity edema GU: negative for frequency, dysuria and hematuria GI: poor appetite,  diarrhea, occasional blood in stool Skin: negative for rash and pruritus Heme: negative for easy bruising and gum/nose bleeding MS: negative for myalgias, arthralgias, back pain- has  weakness Neurolo:negative for headaches, dizziness, vertigo, memory problems  Psych: negative for feelings of anxiety, depression  Endocrine: negative for thyroid, diabetes Allergy/Immunology- negative for any medication or food allergies ?  Objective:  VITALS:  BP 117/78 (BP Location: Left Arm)   Pulse 73   Temp 98.6 F (37 C) (Oral)   Resp 16   Ht 5\' 5"  (1.651 m)   Wt 65.1 kg   SpO2 99%   BMI 23.88 kg/m   PHYSICAL EXAM:  General: Alert, cooperative, no distress, pale, temporal wasting.  Head: Normocephalic, without obvious abnormality, atraumatic. Eyes: Conjunctivae clear, anicteric sclerae. Pupils are equal ENT Nares normal. No drainage or sinus tenderness. Lips, mucosa, and tongue normal. No Thrush- edentulous Rt mastectomy Neck:  symmetrical, no adenopathy, thyroid: non tender no carotid bruit and no JVD. Back: No CVA tenderness. Lungs: Clear to auscultation bilaterally. No Wheezing or Rhonchi. No rales. Heart: Regular rate and rhythm, no murmur, rub or gallop. Abdomen: Soft, non-tender,not distended. Bowel sounds normal. No masses Extremities: left forearm near wrist deformed Skin: No rashes or lesions. Or bruising Lymph: Cervical, supraclavicular normal. Neurologic: Grossly non-focal Pertinent Labs Lab Results CBC    Component Value Date/Time   WBC 7.7 08/06/2023 0526   RBC 2.31 (L) 08/06/2023 0526   HGB 7.5 (L) 08/06/2023 0526   HGB 10.4 (L) 05/10/2023 1320   HGB 9.6 (L) 11/24/2022 1037   HCT 21.8 (L) 08/06/2023 0526   HCT 29.3 (L) 11/24/2022 1037   PLT 471 (H) 08/06/2023 0526   PLT 283 05/10/2023 1320   PLT 284 04/24/2014 1349   MCV 94.4 08/06/2023 0526   MCV 99 (H) 11/24/2022 1037   MCV 93 04/24/2014 1349   MCH 32.5 08/06/2023 0526   MCHC 34.4 08/06/2023 0526   RDW  15.9 (H) 08/06/2023 0526   RDW 12.7 11/24/2022 1037   RDW 13.5 04/24/2014 1349   LYMPHSABS 2.4 08/06/2023 0526   LYMPHSABS 1.2 11/24/2022 1037   LYMPHSABS 1.0 04/24/2014 1349   MONOABS 0.5 08/06/2023 0526   MONOABS 0.5 04/24/2014 1349   EOSABS 0.3 08/06/2023 0526   EOSABS 0.1 11/24/2022 1037   EOSABS 0.1 04/24/2014 1349   BASOSABS 0.1 08/06/2023 0526   BASOSABS 0.0 11/24/2022 1037   BASOSABS 0.1 04/24/2014 1349       Latest Ref Rng & Units 08/06/2023    5:26 AM 08/05/2023    4:07 AM 08/04/2023    4:16 AM  CMP  Glucose 70 - 99 mg/dL 75  79  79   BUN 8 - 23 mg/dL 7  7  6    Creatinine 0.44 - 1.00 mg/dL 2.13  0.86  5.78   Sodium 135 - 145 mmol/L 134  134  133   Potassium 3.5 - 5.1 mmol/L 3.5  4.0  3.3   Chloride 98 - 111 mmol/L 105  105  104   CO2 22 - 32 mmol/L 22  21  21    Calcium 8.9 - 10.3 mg/dL 7.5  6.8  7.4   Total Protein 6.5 - 8.1 g/dL 4.9     Total Bilirubin 0.0 - 1.2 mg/dL 0.6     Alkaline Phos 38 - 126 U/L 74     AST 15 - 41 U/L 17     ALT 0 - 44 U/L 8         Microbiology: Recent Results (from the past 240 hours)  Culture, blood (Routine x 2)     Status: None (Preliminary result)   Collection Time: 08/02/23 10:42 AM   Specimen: BLOOD  Result Value Ref Range Status   Specimen Description BLOOD RIGHT [facility]  Final   Special Requests   Final    BOTTLES DRAWN AEROBIC AND ANAEROBIC Blood Culture adequate volume   Culture   Final    NO GROWTH 3 DAYS Performed at [facility], [address], [place], [place] [address]    Report Status PENDING  Incomplete  Culture, blood (Routine x 2)     Status: None (Preliminary result)   Collection Time: 08/02/23 11:54 AM   Specimen: Right Antecubital; Blood  Result Value Ref Range Status   Specimen Description RIGHT ANTECUBITAL  Final   Special Requests   Final    BOTTLES DRAWN AEROBIC AND ANAEROBIC Blood Culture results may not be optimal due to an inadequate volume of blood received in culture bottles   Culture    Final    NO GROWTH 3 DAYS Performed at [facility], [address] [place], [place] [address]    Report Status PENDING  Incomplete  C Difficile Quick Screen w PCR reflex     Status: Abnormal   Collection Time: 08/02/23  2:05 PM  Result Value Ref Range Status   C Diff antigen POSITIVE (A) NEGATIVE Final   C Diff toxin NEGATIVE NEGATIVE Final   C Diff interpretation Results are indeterminate. See PCR results.  Final    Comment: Performed at [facility], [address] [place], [place] [address]  Gastrointestinal Panel by PCR , Stool     Status: None   Collection Time: 08/02/23  2:05 PM  Result Value Ref Range Status   Campylobacter species NOT DETECTED NOT DETECTED Final   Plesimonas shigelloides NOT DETECTED NOT DETECTED Final   Salmonella species NOT DETECTED NOT DETECTED Final   Yersinia enterocolitica NOT DETECTED NOT DETECTED Final   Vibrio species NOT DETECTED NOT DETECTED Final   Vibrio cholerae NOT DETECTED NOT DETECTED Final   Enteroaggregative E coli (EAEC) NOT DETECTED NOT DETECTED Final   Enteropathogenic E coli (EPEC) NOT DETECTED NOT DETECTED Final   Enterotoxigenic E coli (ETEC) NOT DETECTED NOT DETECTED Final   Shiga like toxin producing E coli (STEC) NOT DETECTED NOT DETECTED Final   Shigella/Enteroinvasive E coli (EIEC) NOT DETECTED NOT DETECTED Final   Cryptosporidium NOT DETECTED NOT DETECTED Final   Cyclospora cayetanensis NOT DETECTED NOT DETECTED Final   Entamoeba histolytica NOT DETECTED NOT DETECTED Final   Giardia lamblia NOT DETECTED NOT DETECTED Final   Adenovirus F40/41 NOT DETECTED NOT DETECTED Final   Astrovirus NOT DETECTED NOT DETECTED Final   Norovirus GI/GII NOT DETECTED NOT DETECTED Final   Rotavirus A NOT DETECTED NOT DETECTED Final  Sapovirus (I, II, IV, and V) NOT DETECTED NOT DETECTED Final    Comment: Performed at [facility], [address] [place], [place] [address]  C. Diff by PCR, Reflexed     Status:  Abnormal   Collection Time: 08/02/23  2:05 PM  Result Value Ref Range Status   Toxigenic C. Difficile by PCR POSITIVE (A) NEGATIVE Final    Comment: Positive for toxigenic C. difficile with little to no toxin production. Only treat if clinical presentation suggests symptomatic illness. Performed at [facility], [address] [place], [place] [address]     IMAGING RESULTS: CXR- no infiltrate I have personally reviewed the films  CT abdomen /pelvis on 06/27/23 Wall thickening of the distal descending colon through the rectum, compatible with inflammatory or infectious colitis.  ? Impression/Recommendation Cdiff diarrhea- antigen positive, toxin negative and PCR positive Persistent cdiff Received 10 days of vanco Now started on fidoxamicin Likely pt is colonized with cdiff Need to figure out the cause of significant unintentional weight loss, and chronic diarrhea If no improvement with fidoxamicin in 24-48 hrs will need CT abdomen to look for colon thickening and may need flex sig for diagnostic reason ( to look for pseudomembrane)  ? ? ___I have personally spent  ---minutes involved in face-to-face and non-face-to-face activities for this patient on the day of the visit. Professional time spent includes the following activities: Preparing to see the patient (review of tests), Obtaining and/or reviewing separately obtained history (admission/discharge record), Performing a medically appropriate examination and/or evaluation , Ordering medications/tests/procedures, referring and communicating with other health care professionals, Documenting clinical information in the EMR, Independently interpreting results (not separately reported), Communicating results to the patient/family/caregiver, Counseling and educating the patient/family/caregiver and Care coordination (not separately reported).    ________________________________________________ Discussed with patient, requesting  provider Note:  This document was prepared using Dragon voice recognition software and may include unintentional dictation errors.  73 year old female with history of stage III triple positive

## 2023-08-06 NOTE — Plan of Care (Signed)
  Problem: Nutrition: Goal: Adequate nutrition will be maintained Outcome: Progressing   Problem: Coping: Goal: Level of anxiety will decrease Outcome: Progressing   

## 2023-08-07 ENCOUNTER — Inpatient Hospital Stay: Payer: Medicare HMO

## 2023-08-07 DIAGNOSIS — E039 Hypothyroidism, unspecified: Secondary | ICD-10-CM | POA: Diagnosis not present

## 2023-08-07 DIAGNOSIS — I5042 Chronic combined systolic (congestive) and diastolic (congestive) heart failure: Secondary | ICD-10-CM | POA: Diagnosis not present

## 2023-08-07 DIAGNOSIS — A0472 Enterocolitis due to Clostridium difficile, not specified as recurrent: Secondary | ICD-10-CM | POA: Diagnosis not present

## 2023-08-07 DIAGNOSIS — E876 Hypokalemia: Secondary | ICD-10-CM | POA: Diagnosis not present

## 2023-08-07 LAB — MAGNESIUM: Magnesium: 1.7 mg/dL (ref 1.7–2.4)

## 2023-08-07 LAB — BASIC METABOLIC PANEL
Anion gap: 9 (ref 5–15)
BUN: 6 mg/dL — ABNORMAL LOW (ref 8–23)
CO2: 22 mmol/L (ref 22–32)
Calcium: 8.2 mg/dL — ABNORMAL LOW (ref 8.9–10.3)
Chloride: 100 mmol/L (ref 98–111)
Creatinine, Ser: 1.02 mg/dL — ABNORMAL HIGH (ref 0.44–1.00)
GFR, Estimated: 58 mL/min — ABNORMAL LOW (ref >60)
Glucose, Bld: 78 mg/dL (ref 70–99)
Potassium: 3.4 mmol/L — ABNORMAL LOW (ref 3.5–5.1)
Sodium: 131 mmol/L — ABNORMAL LOW (ref 135–145)

## 2023-08-07 LAB — CULTURE, BLOOD (ROUTINE X 2)
Culture: NO GROWTH
Culture: NO GROWTH
Special Requests: ADEQUATE

## 2023-08-07 MED ORDER — IOHEXOL 300 MG/ML  SOLN
30.0000 mL | Freq: Once | INTRAMUSCULAR | Status: AC
  Administered 2023-08-07: 30 mL via ORAL

## 2023-08-07 MED ORDER — IOHEXOL 300 MG/ML  SOLN
100.0000 mL | Freq: Once | INTRAMUSCULAR | Status: AC | PRN
  Administered 2023-08-07: 100 mL via INTRAVENOUS

## 2023-08-07 MED ORDER — LIDOCAINE 5 % EX PTCH
2.0000 | MEDICATED_PATCH | CUTANEOUS | Status: DC
  Administered 2023-08-07 – 2023-08-20 (×14): 2 via TRANSDERMAL
  Filled 2023-08-07 (×14): qty 2

## 2023-08-07 MED ORDER — ONDANSETRON HCL 4 MG/2ML IJ SOLN
4.0000 mg | Freq: Four times a day (QID) | INTRAMUSCULAR | Status: DC | PRN
  Administered 2023-08-07 – 2023-08-20 (×8): 4 mg via INTRAVENOUS
  Filled 2023-08-07 (×8): qty 2

## 2023-08-07 MED ORDER — IOHEXOL 300 MG/ML  SOLN: 30.0000 mL | Freq: Once | INTRAMUSCULAR | Status: DC | PRN

## 2023-08-07 MED ORDER — POTASSIUM CHLORIDE CRYS ER 20 MEQ PO TBCR
40.0000 meq | EXTENDED_RELEASE_TABLET | Freq: Once | ORAL | Status: AC
  Administered 2023-08-07: 40 meq via ORAL
  Filled 2023-08-07: qty 2

## 2023-08-07 NOTE — TOC Progression Note (Signed)
Transition of Care [facility]) - Progression Note    Patient Details  Name: Abigail Ortega MRN: [id number] Date of Birth: 1951/07/13  Transition of Care [facility]) CM/SW Contact  Chapman Fitch, RN Phone Number: 08/07/2023, 11:16 AM  Clinical Narrative:     Met with patient at bedside to inform her she would be in her copay days  Call and text placed to [employer] to determine how much she would have to pay upfront  Patient request that when I have the information to call her son Timmy  Expected Discharge Plan: Skilled Nursing Facility Barriers to Discharge: Continued Medical Work up  Expected Discharge Plan and Services       Living arrangements for the past 2 months: Single Family Home                                       Social Determinants of Health (SDOH) Interventions SDOH Screenings   Food Insecurity: No Food Insecurity (08/03/2023)  Housing: Low Risk  (08/03/2023)  Transportation Needs: No Transportation Needs (08/03/2023)  Utilities: Not At Risk (08/03/2023)  Social Connections: Moderately Integrated (08/03/2023)  Tobacco Use: Medium Risk (08/02/2023)    Readmission Risk Interventions     No data to display

## 2023-08-07 NOTE — Progress Notes (Addendum)
PHARMACY CONSULT NOTE - FOLLOW UP  Pharmacy Consult for Electrolyte Monitoring and Replacement   Recent Labs: Potassium (mmol/L)  Date Value  08/07/2023 3.4 (L)  03/20/2014 3.4 (L)   Magnesium (mg/dL)  Date Value  40/98/1191 1.7  02/23/2014 1.7 (L)   Calcium (mg/dL)  Date Value  47/82/9562 8.2 (L)   Calcium, Total (mg/dL)  Date Value  13/02/6577 9.6   Albumin (g/dL)  Date Value  46/96/2952 2.1 (L)  11/24/2022 4.3  03/20/2014 3.4   Phosphorus (mg/dL)  Date Value  84/13/2440 2.7   Sodium (mmol/L)  Date Value  08/07/2023 131 (L)  11/24/2022 137  03/20/2014 135 (L)   Corrected Ca: 9.0     Diet: reg Fluids: none Pertinent Medications: fidaxomicin (Cdiff diarrhea),   Assessment: PM is a 73 yo female who presented for follow-up due to hx of C. Difficile. They have had severe diarrhea, weight-loss and weakness. They arrived hypotensive and confused today. Pharmacy has been consulted to manage this patient's electrolytes.   Goal of Therapy:  Electrolytes WNL   Plan:  Patient started on Mag oxide 400 mg bid 1/20, Mag increased from 1.5>1.7- follow for dose adjustments K low this morning at 3.4, will give KCl 40 mEq PO x 1 Will recheck BMP and Mg with AM labs  Merryl Hacker, PharmD 08/07/2023 7:17 AM

## 2023-08-07 NOTE — Progress Notes (Signed)
PROGRESS NOTE    Abigail Ortega  VOZ:[id number] DOB: January 09, 1951 DOA: 08/02/2023 PCP: Margaretann Loveless, MD  Subjective: Pt seen and examined.  Pt still having diarrhea. Pt thinks it is getting better. RN states pt has had about 5 stools yesterday. All liquid diarrhea.  Thanks for ID and GI consultations. Will proceed with CT abd/pelvis as pt has had 5 days of diarrhea that has not improved with treatment for presumed C. Diff on Dificid.   Hospital Course: HPI: Abigail Ortega is a 73 y.o. female with medical history significant of recent C. difficile colitis s/p treatment with vancomycin (December 2024), CAD s/p DES, breast cancer s/p chemoradiation and mastectomy, hypothyroidism, hypertension, hyperlipidemia, osteoporosis, who presents to the ED due to diarrhea.   [name] states that since she was discharged from SNF approximately 10 days ago, she has been experiencing persistent diarrhea with some generalized abdominal pain.  She denies any nausea or vomiting but notes poor p.o. intake.  She has been experiencing fecal incontinence.  Denies melena or hematochezia, fever/chills.She does endorse shortness of breath with exertion only, but denies any chest pain, palpitations.  She notes that shortness of breath with exertion began after her recent hospitalization, stating she just feels very weak and.   ED course: On arrival to the ED, patient was normotensive at 110/73 with heart rate of 59.  She is saturating at 98% on room air.  She was afebrile at 98.5.  Initial workup notable for hemoglobin of 8.9, platelets 533, potassium 2.6, creatinine 1.13, GFR 51.  Lactic acid within normal limits.  Patient started on IV fluids and potassium supplementation.  [facility] contacted for admission.  Significant Events: Admitted 08/02/2023 with acute colitis, rule out recurrent C. diff 08-06-2023 GI and ID consulted due to continued diarrhea despite 4 days of treatment for recurrent C. Diff with  Dificid.  Significant Labs: hemoglobin of 8.9, platelets 533, potassium 2.6, creatinine 1.13, GFR 51. Lactic acid within normal limits   Significant Imaging Studies: CXR No acute cardiopulmonary findings.   Antibiotic Therapy: Anti-infectives (From admission, onward)    Start     Dose/Rate Route Frequency Ordered Stop   08/02/23 2245  fidaxomicin (DIFICID) tablet 200 mg        200 mg Oral 2 times daily 08/02/23 2242 08/12/23 2159       Procedures:   Consultants: GI ID    Assessment and Plan: * C. difficile colitis On admission, Patient is presenting with 10 days of severe diarrhea and fecal incontinence in the setting of recent C. difficile colitis approximately 1 month prior, concerning for treatment failure versus reinfection.  Examination is nonfocal, so will hold off on imaging at this time 08-03-2023 pt's C. Diff PCR and antigen were POSITIVE. Diarrhea was present on admission so this is NOT an HAI. C. Diff toxin negative but given recent bout of c. Diff diarrhea last month that was treated with po vanco, will consider this a recurrence of C. Diff.  Continue with dificid bid x 10 days. Possible home tomorrow if no more diarrhea. 08-04-2023 only 1 diarrheal stool yesterday. PT recs SNF at discharge. Will consult TOC.  Arranged for outpatient [facility] pharmacy to obtain 10 days of dificid for patient. Currently held in inpatient pharmacy for pt to take home. WBC is normal. 08-05-2023 no evidence of dehydration on labs. Add some Questran to help bulk up stools. Continue with dificid. She is medically stable for DC. Awaiting SNF bed. 08-06-2023 still with diarrhea. 3  charts diarrhea stools yesterday. No abd distension or pain. Has been on dificid for 4 days now. On cholestyramine for 24 hours. Considering add po vanco qid to regimen. Po vanco worked last month for her c. Diff infection.  08-07-2023 yesterday, I consulted GI and ID given pt has had lack of improvement in quantity and  quality of diarrhea.  What pt is stating and what is observed by nursing staff is drastically different. Nursing staff stating pt still with frequent, liquid and incontinent diarrhea. Pt stating her diarrhea "is getting better".  ID and GI notes reviewed. Remains on Dificid.  Will obtain CT abd/pelvis to further evaluate for colitis.  Pt has had weight loss. Also consultant notes pt with 9 months of diarrhea. GI has offered either inpatient vs outpatient endoscopic evaluation for her weight loss. Pt is not iron deficient per GI consult note.    Hypokalemia On admission, In the setting of diarrhea.  EKG demonstrating QTc prolongation. S/p 60 meq replacement. 08-03-2023 K still 2.8. will give Kphos 08-04-2023 give more po kcl. Repeat EKG to document shortening of Qtc 08-05-2023 awaiting EKG that was ordered yesterday to be transmitted. 08-06-2023 stable  08-07-2023 continue with prn potassium replacement.  Severe hypothyroidism On most recent admission in December 2024, TSH was noted to be markedly elevated at 270.  She was treated with IV Synthroid initially and transition to p.o. 08-03-2023 TSH 89.9. improved from 270. Continue at same dose of synthroid. 08-04-2023 stable 08-05-2023 stable 08-06-2023 stable  08-07-2023 stable. Repeat TSH and FT4 in 6 weeks as outpatient.  Chronic hypotension On admission, Initial concern at GI office earlier today for hypotension, however patient's blood pressure has been within normal limits since arrival to the ED. Continue home midodrine 08-03-2023 BP improved with IVF. Continue midodrine. 08-04-2023 stable. Continue home midodrine. 08-05-2023 stable 08-06-2023 stable  08-07-2023 BP is stable.  CAD (coronary artery disease) On admission, Patient reports shortness of breath on exertion but denies any chest pain with exertion.  This is likely due to deconditioning after prolonged hospitalization and rehabilitation over the last month. Continue home  regimen 08-03-2023 no further SOB. 08-04-2023 stable 08-05-2023 stable 08-06-2023 stable  08-07-2023 stable.  Chronic combined systolic and diastolic CHF (congestive heart failure) (HCC) On admission, Patient appears hypovolemic on examination in the setting of diarrhea.  Hold home Lasix.  Daily weights 08-03-2023 appears euvolemic. Consider restarting lasix tomorrow. 08-04-2023 pt still having diarrhea. Continue to hold lasix. 08-05-2023 pt continues to c/o of diarrhea. Continue to hold lasix. 08-06-2023 euvolemic.  08-07-2023 continue to hold lasix. Pt is euvolemic.  Hypomagnesemia 08-06-2023 replete with po mag oxide.  Normocytic anemia 08-04-2023. Will repeat iron studies. Folate levels low. Start folic acid. 08-05-2023  continue with oral iron. Based on labs, she has not been taking her iron tabs.  08-07-2023 GI consult states pt is not iron deficient. Iron/TIBC/Ferritin/ %Sat    Component Value Date/Time   IRON 50 08/04/2023 0416   TIBC 141 (L) 08/04/2023 0416   IRONPCTSAT 35 (H) 08/04/2023 0416      Anemia Workup Lab Results  Component Value Date/Time   VITAMINB12 598 05/10/2023 01:21 PM   FOLATE 4.6 (L) 06/28/2023 06:32 AM   FERRITIN 299 05/10/2023 01:20 PM   TIBC 252 05/10/2023 01:20 PM   IRON 63 05/10/2023 01:20 PM   RETICCTPCT 2.2 05/10/2023 01:20 PM    Folate deficiency 08-04-2023 start folic acid 1 mg daily. 08-05-2023 stable 08-06-2023 stable  08-07-2023 stable. Continue folic acid 1 mg  daily.   DVT prophylaxis: enoxaparin (LOVENOX) injection 40 mg Start: 08/02/23 2200    Code Status: Do not attempt resuscitation (DNR) PRE-ARREST INTERVENTIONS DESIRED Family Communication: no family at bedside Disposition Plan: SNF Reason for continuing need for hospitalization: getting CT abd/pelvis to further evaluate C. Diff diarrhea.  Objective: Vitals:   08/06/23 1931 08/07/23 0336 08/07/23 0443 08/07/23 0809  BP: 136/72 113/71  130/75  Pulse: 70 77   76  Resp: 20 20  18   Temp: 98.5 F (36.9 C) 98.3 F (36.8 C)  98 F (36.7 C)  TempSrc: Oral Oral    SpO2: 97% 98%  100%  Weight:   69.7 kg   Height:       No intake or output data in the 24 hours ending 08/07/23 0957 Filed Weights   08/03/23 0702 08/05/23 0500 08/07/23 0443  Weight: 61.2 kg 65.1 kg 69.7 kg    Examination:  Physical Exam Vitals and nursing note reviewed.  Constitutional:      General: She is not in acute distress.    Appearance: She is not toxic-appearing or diaphoretic.     Comments: Chronically ill appearing  HENT:     Head: Normocephalic and atraumatic.     Nose: Nose normal.  Eyes:     General: No scleral icterus. Cardiovascular:     Rate and Rhythm: Normal rate and regular rhythm.  Pulmonary:     Effort: Pulmonary effort is normal.     Breath sounds: Normal breath sounds.  Abdominal:     General: Bowel sounds are normal. There is no distension.     Palpations: Abdomen is soft.     Tenderness: There is no abdominal tenderness.  Musculoskeletal:     Right lower leg: No edema.     Left lower leg: No edema.  Skin:    General: Skin is warm and dry.     Capillary Refill: Capillary refill takes less than 2 seconds.  Neurological:     General: No focal deficit present.     Mental Status: She is alert and oriented to person, place, and time.     Data Reviewed: I have personally reviewed following labs and imaging studies  CBC: Recent Labs  Lab 08/02/23 1042 08/03/23 0537 08/04/23 0416 08/06/23 0526  WBC 7.3 5.8 6.0 7.7  NEUTROABS 5.3  --  2.9 3.8  HGB 8.9* 8.0* 8.1* 7.5*  HCT 27.5* 23.4* 24.0* 21.8*  MCV 96.8 92.9 94.9 94.4  PLT 533* 483* 479* 471*   Basic Metabolic Panel: Recent Labs  Lab 08/03/23 0537 08/04/23 0416 08/05/23 0407 08/06/23 0526 08/07/23 0437  NA 135 133* 134* 134* 131*  K 2.8* 3.3* 4.0 3.5 3.4*  CL 101 104 105 105 100  CO2 25 21* 21* 22 22  GLUCOSE 97 79 79 75 78  BUN 9 6* 7* 7* 6*  CREATININE 0.84 0.92 0.98  1.00 1.02*  CALCIUM 7.5* 7.4* 6.8* 7.5* 8.2*  MG 2.1 1.9 1.8 1.5* 1.7  PHOS 1.1* 1.9* 2.7  --   --    GFR: Estimated Creatinine Clearance: 48.2 mL/min (A) (by C-G formula based on SCr of 1.02 mg/dL (H)). Liver Function Tests: Recent Labs  Lab 08/02/23 1042 08/06/23 0526  AST 21 17  ALT 10 8  ALKPHOS 106 74  BILITOT 1.4* 0.6  PROT 6.3* 4.9*  ALBUMIN 2.9* 2.1*   Coagulation Profile: Recent Labs  Lab 08/02/23 1042  INR 1.0   Sepsis Labs: Recent Labs  Lab 08/02/23 1042  LATICACIDVEN 1.6    Recent Results (from the past 240 hours)  Culture, blood (Routine x 2)     Status: None   Collection Time: 08/02/23 10:42 AM   Specimen: BLOOD  Result Value Ref Range Status   Specimen Description BLOOD RIGHT [facility]  Final   Special Requests   Final    BOTTLES DRAWN AEROBIC AND ANAEROBIC Blood Culture adequate volume   Culture   Final    NO GROWTH 5 DAYS Performed at [facility], [address] [place], [place] [address]    Report Status 08/07/2023 FINAL  Final  Culture, blood (Routine x 2)     Status: None   Collection Time: 08/02/23 11:54 AM   Specimen: Right Antecubital; Blood  Result Value Ref Range Status   Specimen Description RIGHT ANTECUBITAL  Final   Special Requests   Final    BOTTLES DRAWN AEROBIC AND ANAEROBIC Blood Culture results may not be optimal due to an inadequate volume of blood received in culture bottles   Culture   Final    NO GROWTH 5 DAYS Performed at [facility], [address] [place], [place] [address]    Report Status 08/07/2023 FINAL  Final  C Difficile Quick Screen w PCR reflex     Status: Abnormal   Collection Time: 08/02/23  2:05 PM  Result Value Ref Range Status   C Diff antigen POSITIVE (A) NEGATIVE Final   C Diff toxin NEGATIVE NEGATIVE Final   C Diff interpretation Results are indeterminate. See PCR results.  Final    Comment: Performed at [facility], [address] [place], [place] [address]   Gastrointestinal Panel by PCR , Stool     Status: None   Collection Time: 08/02/23  2:05 PM  Result Value Ref Range Status   Campylobacter species NOT DETECTED NOT DETECTED Final   Plesimonas shigelloides NOT DETECTED NOT DETECTED Final   Salmonella species NOT DETECTED NOT DETECTED Final   Yersinia enterocolitica NOT DETECTED NOT DETECTED Final   Vibrio species NOT DETECTED NOT DETECTED Final   Vibrio cholerae NOT DETECTED NOT DETECTED Final   Enteroaggregative E coli (EAEC) NOT DETECTED NOT DETECTED Final   Enteropathogenic E coli (EPEC) NOT DETECTED NOT DETECTED Final   Enterotoxigenic E coli (ETEC) NOT DETECTED NOT DETECTED Final   Shiga like toxin producing E coli (STEC) NOT DETECTED NOT DETECTED Final   Shigella/Enteroinvasive E coli (EIEC) NOT DETECTED NOT DETECTED Final   Cryptosporidium NOT DETECTED NOT DETECTED Final   Cyclospora cayetanensis NOT DETECTED NOT DETECTED Final   Entamoeba histolytica NOT DETECTED NOT DETECTED Final   Giardia lamblia NOT DETECTED NOT DETECTED Final   Adenovirus F40/41 NOT DETECTED NOT DETECTED Final   Astrovirus NOT DETECTED NOT DETECTED Final   Norovirus GI/GII NOT DETECTED NOT DETECTED Final   Rotavirus A NOT DETECTED NOT DETECTED Final   Sapovirus (I, II, IV, and V) NOT DETECTED NOT DETECTED Final    Comment: Performed at [facility], [address] [place], [place] [address]  C. Diff by PCR, Reflexed     Status: Abnormal   Collection Time: 08/02/23  2:05 PM  Result Value Ref Range Status   Toxigenic C. Difficile by PCR POSITIVE (A) NEGATIVE Final    Comment: Positive for toxigenic C. difficile with little to no toxin production. Only treat if clinical presentation suggests symptomatic illness. Performed at [facility], [address], [place], [place] [address]     Scheduled Meds:  aspirin EC  81 mg Oral Daily   atorvastatin  80 mg Oral Daily   cholestyramine  4 g Oral BID   clopidogrel  75 mg Oral Daily    dicyclomine  10 mg Oral TID AC   enoxaparin (LOVENOX) injection  40 mg Subcutaneous Q24H   ferrous sulfate  325 mg Oral Daily   fidaxomicin  200 mg Oral BID   folic acid  1 mg Oral Daily   Gerhardt's butt cream   Topical QID   influenza vaccine adjuvanted  0.5 mL Intramuscular Tomorrow-1000   isosorbide mononitrate  60 mg Oral BID   levothyroxine  88 mcg Oral Q0600   lidocaine  2 patch Transdermal Q24H   magnesium oxide  400 mg Oral BID   midodrine  10 mg Oral TID WC   montelukast  10 mg Oral QHS   potassium chloride  40 mEq Oral Once   ranolazine  500 mg Oral BID   Continuous Infusions:   LOS: 4 days   Time spent: 40 minutes  Carollee Herter, DO  [facility]  08/07/2023, 9:57 AM

## 2023-08-07 NOTE — Progress Notes (Signed)
Date of Admission:  08/02/2023   ID: Abigail Ortega is a 73 y.o. female Principal Problem:   C. difficile colitis Active Problems:   Chronic combined systolic and diastolic CHF (congestive heart failure) (HCC)   CAD (coronary artery disease)   Chronic hypotension   Hypokalemia   Severe hypothyroidism   Folate deficiency   Normocytic anemia   Hypomagnesemia    Subjective: Pt says she is starting to feel better 2 loose stools today Appetite better  Medications:   aspirin EC  81 mg Oral Daily   atorvastatin  80 mg Oral Daily   cholestyramine  4 g Oral BID   clopidogrel  75 mg Oral Daily   dicyclomine  10 mg Oral TID AC   enoxaparin (LOVENOX) injection  40 mg Subcutaneous Q24H   ferrous sulfate  325 mg Oral Daily   fidaxomicin  200 mg Oral BID   folic acid  1 mg Oral Daily   Gerhardt's butt cream   Topical QID   influenza vaccine adjuvanted  0.5 mL Intramuscular Tomorrow-1000   isosorbide mononitrate  60 mg Oral BID   levothyroxine  88 mcg Oral Q0600   lidocaine  2 patch Transdermal Q24H   magnesium oxide  400 mg Oral BID   midodrine  10 mg Oral TID WC   montelukast  10 mg Oral QHS   ranolazine  500 mg Oral BID    Objective: Vital signs in last 24 hours: Patient Vitals for the past 24 hrs:  BP Temp Temp src Pulse Resp SpO2 Weight  08/07/23 0809 130/75 98 F (36.7 C) -- 76 18 100 % --  08/07/23 0443 -- -- -- -- -- -- 69.7 kg  08/07/23 0336 113/71 98.3 F (36.8 C) Oral 77 20 98 % --  08/06/23 1931 136/72 98.5 F (36.9 C) Oral 70 20 97 % --  08/06/23 1721 104/71 98.7 F (37.1 C) Oral 73 16 100 % --      PHYSICAL EXAM:  General: Alert, cooperative, no distress, appears stated age.  Head: Normocephalic, without obvious abnormality, atraumatic. Eyes: Conjunctivae clear, anicteric sclerae. Pupils are equal ENT Nares normal. No drainage or sinus tenderness. Lips, mucosa, and tongue normal. No Thrush Neck: Supple, symmetrical, no adenopathy, thyroid: non  tender no carotid bruit and no JVD. Back: No CVA tenderness. Lungs: Clear to auscultation bilaterally. No Wheezing or Rhonchi. No rales. Heart: Regular rate and rhythm, no murmur, rub or gallop. Abdomen: Soft, non-tender,not distended. Bowel sounds normal. No masses Extremities: left colles fracture deformity Skin: No rashes or lesions. Or bruising Lymph: Cervical, supraclavicular normal. Neurologic: Grossly non-focal  Lab Results    Latest Ref Rng & Units 08/06/2023    5:26 AM 08/04/2023    4:16 AM 08/03/2023    5:37 AM  CBC  WBC 4.0 - 10.5 K/uL 7.7  6.0  5.8   Hemoglobin 12.0 - 15.0 g/dL 7.5  8.1  8.0   Hematocrit 36.0 - 46.0 % 21.8  24.0  23.4   Platelets 150 - 400 K/uL 471  479  483        Latest Ref Rng & Units 08/07/2023    4:37 AM 08/06/2023    5:26 AM 08/05/2023    4:07 AM  CMP  Glucose 70 - 99 mg/dL 78  75  79   BUN 8 - 23 mg/dL 6  7  7    Creatinine 0.44 - 1.00 mg/dL 6.23  7.62  8.31   Sodium 135 - 145  mmol/L 131  134  134   Potassium 3.5 - 5.1 mmol/L 3.4  3.5  4.0   Chloride 98 - 111 mmol/L 100  105  105   CO2 22 - 32 mmol/L 22  22  21    Calcium 8.9 - 10.3 mg/dL 8.2  7.5  6.8   Total Protein 6.5 - 8.1 g/dL  4.9    Total Bilirubin 0.0 - 1.2 mg/dL  0.6    Alkaline Phos 38 - 126 U/L  74    AST 15 - 41 U/L  17    ALT 0 - 44 U/L  8        Microbiology: Stool - cdiff antigen and PCR psitive    Assessment/Plan: Cdiff diarrhea- antigen positive, toxin negative and PCR positive Persistent cdiff Received 10 days of vanco Now on fidoxamicin Need to figure out the cause of significant unintentional weight loss, and chronic diarrhea if it is other than cdiff CT scan done today await reading   Ca breast s.p rt mastectomy- triple positive was on letrozole fr 7 years and completed    Anemia   Severe hypothyroidism on synthroid  Discussed the management with the patient

## 2023-08-07 NOTE — TOC Progression Note (Addendum)
Transition of Care [facility]) - Progression Note    Patient Details  Name: Abigail Ortega MRN: [id number] Date of Birth: 10-29-1950  Transition of Care [facility]) CM/SW Contact  Chapman Fitch, RN Phone Number: 08/07/2023, 3:56 PM  Clinical Narrative:     Per Admission coordinator at [facility] Patient is not currently in her copay days, as she has only used 14 days.Patient updated.  Patient request that I call her son to update.  VM left for Timothy.  Per MD anticipated that patient will medically be ready for discharge on Friday.  Patient will insurance auth    Expected Discharge Plan: Skilled Nursing Facility Barriers to Discharge: Continued Medical Work up  Expected Discharge Plan and Services       Living arrangements for the past 2 months: Single Family Home                                       Social Determinants of Health (SDOH) Interventions SDOH Screenings   Food Insecurity: No Food Insecurity (08/03/2023)  Housing: Low Risk  (08/03/2023)  Transportation Needs: No Transportation Needs (08/03/2023)  Utilities: Not At Risk (08/03/2023)  Social Connections: Moderately Integrated (08/03/2023)  Tobacco Use: Medium Risk (08/02/2023)    Readmission Risk Interventions     No data to display

## 2023-08-08 DIAGNOSIS — A0472 Enterocolitis due to Clostridium difficile, not specified as recurrent: Secondary | ICD-10-CM | POA: Diagnosis not present

## 2023-08-08 LAB — CBC WITH DIFFERENTIAL/PLATELET
Abs Immature Granulocytes: 0.38 10*3/uL — ABNORMAL HIGH (ref 0.00–0.07)
Basophils Absolute: 0.1 10*3/uL (ref 0.0–0.1)
Basophils Relative: 1 %
Eosinophils Absolute: 0.2 10*3/uL (ref 0.0–0.5)
Eosinophils Relative: 2 %
HCT: 25.7 % — ABNORMAL LOW (ref 36.0–46.0)
Hemoglobin: 9 g/dL — ABNORMAL LOW (ref 12.0–15.0)
Immature Granulocytes: 5 %
Lymphocytes Relative: 31 %
Lymphs Abs: 2.5 10*3/uL (ref 0.7–4.0)
MCH: 32.1 pg (ref 26.0–34.0)
MCHC: 35 g/dL (ref 30.0–36.0)
MCV: 91.8 fL (ref 80.0–100.0)
Monocytes Absolute: 0.5 10*3/uL (ref 0.1–1.0)
Monocytes Relative: 6 %
Neutro Abs: 4.7 10*3/uL (ref 1.7–7.7)
Neutrophils Relative %: 55 %
Platelets: 502 10*3/uL — ABNORMAL HIGH (ref 150–400)
RBC: 2.8 MIL/uL — ABNORMAL LOW (ref 3.87–5.11)
RDW: 15.5 % (ref 11.5–15.5)
WBC: 8.3 10*3/uL (ref 4.0–10.5)
nRBC: 0 % (ref 0.0–0.2)

## 2023-08-08 LAB — BASIC METABOLIC PANEL
Anion gap: 10 (ref 5–15)
BUN: 6 mg/dL — ABNORMAL LOW (ref 8–23)
CO2: 20 mmol/L — ABNORMAL LOW (ref 22–32)
Calcium: 8.5 mg/dL — ABNORMAL LOW (ref 8.9–10.3)
Chloride: 98 mmol/L (ref 98–111)
Creatinine, Ser: 1.08 mg/dL — ABNORMAL HIGH (ref 0.44–1.00)
GFR, Estimated: 54 mL/min — ABNORMAL LOW (ref >60)
Glucose, Bld: 84 mg/dL (ref 70–99)
Potassium: 3.6 mmol/L (ref 3.5–5.1)
Sodium: 128 mmol/L — ABNORMAL LOW (ref 135–145)

## 2023-08-08 NOTE — Progress Notes (Addendum)
Mobility Specialist - Progress Note   08/08/23 1300  Mobility  Activity Ambulated with assistance in hallway;Transferred from bed to chair  Level of Assistance Standby assist, set-up cues, supervision of patient - no hands on  Assistive Device Front wheel walker  Distance Ambulated (ft) 50 ft  Activity Response Tolerated well  Mobility visit 1 Mobility     Pt lying in bed upon arrival, utilizing RA. Pt agreeable to activity. Completed bed mobility modI. STS with CGA and ambulation with minG. No LOB. Pt requested seated rest break after ~ 25' d/t fatigue. Pt left in recliner with alarm set, needs in reach.    Filiberto Pinks Mobility Specialist 08/08/23, 1:47 PM

## 2023-08-08 NOTE — Progress Notes (Signed)
PROGRESS NOTE    [name]  YNW:[id number] DOB: 12/28/1950 DOA: 08/02/2023 PCP: Margaretann Loveless, MD    Brief Narrative:  Abigail Ortega is a 73 y.o. female with medical history significant of recent C. difficile colitis s/p treatment with vancomycin (December 2024), CAD s/p DES, breast cancer s/p chemoradiation and mastectomy, hypothyroidism, hypertension, hyperlipidemia, osteoporosis, who presents to the ED due to diarrhea.   Abigail Ortega states that since she was discharged from SNF approximately 10 days ago, she has been experiencing persistent diarrhea with some generalized abdominal pain.  She denies any nausea or vomiting but notes poor p.o. intake.  She has been experiencing fecal incontinence.  Denies melena or hematochezia, fever/chills.She does endorse shortness of breath with exertion only, but denies any chest pain, palpitations.  She notes that shortness of breath with exertion began after her recent hospitalization  Patient has had significant diarrhea after 10 days of vancomycin.  Infectious disease consulted 1/21.  GI consulted as well with plans for deferring endoscopic evaluation outpatient or until resolution of colitis.  CT abdomen pelvis on 1/21 demonstrates mild to moderate colitis without evidence of abscess or perforation.   Assessment & Plan:   Principal Problem:   C. difficile colitis Active Problems:   Hypokalemia   Chronic combined systolic and diastolic CHF (congestive heart failure) (HCC)   CAD (coronary artery disease)   Chronic hypotension   Severe hypothyroidism   Folate deficiency   Normocytic anemia   Hypomagnesemia  C. difficile colitis  intractable diarrhea 10 days diarrhea coming in.  C. difficile PCR positive.  Interestingly toxin was negative.  Patient completed 10 days of oral vancomycin and still had diarrhea.  Started on Dificid.  Infectious disease consulted 1/21.  GI consulted as well with recommendation to defer endoscopic  evaluation at this time.  CT abdomen pelvis with mild to moderate colitis without evidence of abscess or perforation.  Patient hemodynamically stable.  Belly soft on examination. Plan: Continue Dificid Continue Questran Monitor for BM Likely will defer endoscopic evaluation as outpatient  Hypokalemia QT prolongation Likely in the setting of intractable diarrhea Monitor and replace as needed  Hypothyroidism Stable Repeat TSH and free T4 in 6 weeks as outpatient  Chronic hypotension Stable Continue midodrine  Coronary disease Stable. Continue home regimen  Chronic systolic and diastolic congestive heart failure  Appears hypovolemic secondary to intractable diarrhea Hold Lasix at this time  Normocytic anemia Suspect anemia of inflammation.  No evidence for acute iron deficiency.  DVT prophylaxis: SQ Lovenox Code Status: DNR Family Communication: None today Disposition Plan: Status is: Inpatient Remains inpatient appropriate because: Intractable diarrhea in the setting of C. difficile colitis   Level of care: Med-Surg  Consultants:  ID  Procedures:  None  Antimicrobials: Dificid   Subjective: Seen and examined.  Resting comfortably in bed.  No visible distress.  Reports diarrhea is improving.  Objective: Vitals:   08/07/23 2054 08/08/23 0102 08/08/23 0500 08/08/23 0733  BP: 127/84 132/87  104/76  Pulse: 87 91  89  Resp: 18 18  16   Temp: 98.5 F (36.9 C) 98.4 F (36.9 C)  98.4 F (36.9 C)  TempSrc: Oral Oral  Oral  SpO2: 100% 97%  98%  Weight:   65 kg   Height:       No intake or output data in the 24 hours ending 08/08/23 1213 Filed Weights   08/05/23 0500 08/07/23 0443 08/08/23 0500  Weight: 65.1 kg 69.7 kg 65 kg  Examination:  General exam: Appears frail but stable Respiratory system: Clear to auscultation. Respiratory effort normal. Cardiovascular system: S1-S2, RRR, no murmurs, no pedal edema Gastrointestinal system: Thin, soft, NT/ND,  normal active bowel sounds Central nervous system: Alert and oriented. No focal neurological deficits. Extremities: Symmetric 5 x 5 power. Skin: No rashes, lesions or ulcers Psychiatry: Judgement and insight appear normal. Mood & affect appropriate.     Data Reviewed: I have personally reviewed following labs and imaging studies  CBC: Recent Labs  Lab 08/02/23 1042 08/03/23 0537 08/04/23 0416 08/06/23 0526 08/08/23 0838  WBC 7.3 5.8 6.0 7.7 8.3  NEUTROABS 5.3  --  2.9 3.8 4.7  HGB 8.9* 8.0* 8.1* 7.5* 9.0*  HCT 27.5* 23.4* 24.0* 21.8* 25.7*  MCV 96.8 92.9 94.9 94.4 91.8  PLT 533* 483* 479* 471* 502*   Basic Metabolic Panel: Recent Labs  Lab 08/03/23 0537 08/04/23 0416 08/05/23 0407 08/06/23 0526 08/07/23 0437 08/08/23 0838  NA 135 133* 134* 134* 131* 128*  K 2.8* 3.3* 4.0 3.5 3.4* 3.6  CL 101 104 105 105 100 98  CO2 25 21* 21* 22 22 20*  GLUCOSE 97 79 79 75 78 84  BUN 9 6* 7* 7* 6* 6*  CREATININE 0.84 0.92 0.98 1.00 1.02* 1.08*  CALCIUM 7.5* 7.4* 6.8* 7.5* 8.2* 8.5*  MG 2.1 1.9 1.8 1.5* 1.7  --   PHOS 1.1* 1.9* 2.7  --   --   --    GFR: Estimated Creatinine Clearance: 41.7 mL/min (A) (by C-G formula based on SCr of 1.08 mg/dL (H)). Liver Function Tests: Recent Labs  Lab 08/02/23 1042 08/06/23 0526  AST 21 17  ALT 10 8  ALKPHOS 106 74  BILITOT 1.4* 0.6  PROT 6.3* 4.9*  ALBUMIN 2.9* 2.1*   No results for input(s): "LIPASE", "AMYLASE" in the last 168 hours. No results for input(s): "AMMONIA" in the last 168 hours. Coagulation Profile: Recent Labs  Lab 08/02/23 1042  INR 1.0   Cardiac Enzymes: No results for input(s): "CKTOTAL", "CKMB", "CKMBINDEX", "TROPONINI" in the last 168 hours. BNP (last 3 results) No results for input(s): "PROBNP" in the last 8760 hours. HbA1C: No results for input(s): "HGBA1C" in the last 72 hours. CBG: No results for input(s): "GLUCAP" in the last 168 hours. Lipid Profile: No results for input(s): "CHOL", "HDL",  "LDLCALC", "TRIG", "CHOLHDL", "LDLDIRECT" in the last 72 hours. Thyroid Function Tests: No results for input(s): "TSH", "T4TOTAL", "FREET4", "T3FREE", "THYROIDAB" in the last 72 hours. Anemia Panel: No results for input(s): "VITAMINB12", "FOLATE", "FERRITIN", "TIBC", "IRON", "RETICCTPCT" in the last 72 hours. Sepsis Labs: Recent Labs  Lab 08/02/23 1042  LATICACIDVEN 1.6    Recent Results (from the past 240 hours)  Culture, blood (Routine x 2)     Status: None   Collection Time: 08/02/23 10:42 AM   Specimen: BLOOD  Result Value Ref Range Status   Specimen Description BLOOD RIGHT [facility]  Final   Special Requests   Final    BOTTLES DRAWN AEROBIC AND ANAEROBIC Blood Culture adequate volume   Culture   Final    NO GROWTH 5 DAYS Performed at [facility], [address], [place], [place] [address]    Report Status 08/07/2023 FINAL  Final  Culture, blood (Routine x 2)     Status: None   Collection Time: 08/02/23 11:54 AM   Specimen: Right Antecubital; Blood  Result Value Ref Range Status   Specimen Description RIGHT ANTECUBITAL  Final   Special Requests  Final    BOTTLES DRAWN AEROBIC AND ANAEROBIC Blood Culture results may not be optimal due to an inadequate volume of blood received in culture bottles   Culture   Final    NO GROWTH 5 DAYS Performed at [facility], [address] [place], [place] [address]    Report Status 08/07/2023 FINAL  Final  C Difficile Quick Screen w PCR reflex     Status: Abnormal   Collection Time: 08/02/23  2:05 PM  Result Value Ref Range Status   C Diff antigen POSITIVE (A) NEGATIVE Final   C Diff toxin NEGATIVE NEGATIVE Final   C Diff interpretation Results are indeterminate. See PCR results.  Final    Comment: Performed at [facility], [address] [place], [place] [address]  Gastrointestinal Panel by PCR , Stool     Status: None   Collection Time: 08/02/23  2:05 PM  Result Value Ref Range Status   Campylobacter  species NOT DETECTED NOT DETECTED Final   Plesimonas shigelloides NOT DETECTED NOT DETECTED Final   Salmonella species NOT DETECTED NOT DETECTED Final   Yersinia enterocolitica NOT DETECTED NOT DETECTED Final   Vibrio species NOT DETECTED NOT DETECTED Final   Vibrio cholerae NOT DETECTED NOT DETECTED Final   Enteroaggregative E coli (EAEC) NOT DETECTED NOT DETECTED Final   Enteropathogenic E coli (EPEC) NOT DETECTED NOT DETECTED Final   Enterotoxigenic E coli (ETEC) NOT DETECTED NOT DETECTED Final   Shiga like toxin producing E coli (STEC) NOT DETECTED NOT DETECTED Final   Shigella/Enteroinvasive E coli (EIEC) NOT DETECTED NOT DETECTED Final   Cryptosporidium NOT DETECTED NOT DETECTED Final   Cyclospora cayetanensis NOT DETECTED NOT DETECTED Final   Entamoeba histolytica NOT DETECTED NOT DETECTED Final   Giardia lamblia NOT DETECTED NOT DETECTED Final   Adenovirus F40/41 NOT DETECTED NOT DETECTED Final   Astrovirus NOT DETECTED NOT DETECTED Final   Norovirus GI/GII NOT DETECTED NOT DETECTED Final   Rotavirus A NOT DETECTED NOT DETECTED Final   Sapovirus (I, II, IV, and V) NOT DETECTED NOT DETECTED Final    Comment: Performed at [facility], [address] [place], [place] [address]  C. Diff by PCR, Reflexed     Status: Abnormal   Collection Time: 08/02/23  2:05 PM  Result Value Ref Range Status   Toxigenic C. Difficile by PCR POSITIVE (A) NEGATIVE Final    Comment: Positive for toxigenic C. difficile with little to no toxin production. Only treat if clinical presentation suggests symptomatic illness. Performed at [facility], [address] [place], [place] [address]          Radiology Studies: CT ABDOMEN PELVIS W CONTRAST Result Date: 08/07/2023 CLINICAL DATA:  Severe diarrhea.  Recurrent C difficile colitis. EXAM: CT ABDOMEN AND PELVIS WITH CONTRAST TECHNIQUE: Multidetector CT imaging of the abdomen and pelvis was performed using the standard protocol  following bolus administration of intravenous contrast. RADIATION DOSE REDUCTION: This exam was performed according to the departmental dose-optimization program which includes automated exposure control, adjustment of the mA and/or kV according to patient size and/or use of iterative reconstruction technique. CONTRAST:  OMNIPAQUE IOHEXOL 300 MG/ML  SOLN COMPARISON:  06/27/2023 FINDINGS: Lower Chest: No acute findings. Scarring and traction bronchiectasis again seen in the right middle lobe. Hepatobiliary: No suspicious hepatic masses identified. Gallbladder is unremarkable. No evidence of biliary ductal dilatation. Pancreas:  No mass or inflammatory changes. Spleen: Within normal limits in size and appearance. Adrenals/Urinary Tract: No  suspicious masses identified. No evidence of ureteral calculi or hydronephrosis. Stomach/Bowel: Small hiatal hernia again seen. Mild wall thickening of distal esophagus is consistent with esophagitis. Mild to moderate wall thickening of the descending and rectosigmoid colon is seen, without significant change since prior study. No evidence of perforation or abscess. Vascular/Lymphatic: No pathologically enlarged lymph nodes. No acute vascular findings. Reproductive:  No mass or other significant abnormality. Other:  None. Musculoskeletal:  No suspicious bone lesions identified. IMPRESSION: Mild to moderate colitis involving the descending and rectosigmoid colon, without significant change since prior exam. No evidence of perforation or abscess. Small hiatal hernia, with distal esophagitis likely due to reflux. Electronically Signed   By: Danae Orleans M.D.   On: 08/07/2023 19:10        Scheduled Meds:  aspirin EC  81 mg Oral Daily   atorvastatin  80 mg Oral Daily   cholestyramine  4 g Oral BID   clopidogrel  75 mg Oral Daily   dicyclomine  10 mg Oral TID AC   enoxaparin (LOVENOX) injection  40 mg Subcutaneous Q24H   ferrous sulfate  325 mg Oral Daily    fidaxomicin  200 mg Oral BID   folic acid  1 mg Oral Daily   Gerhardt's butt cream   Topical QID   influenza vaccine adjuvanted  0.5 mL Intramuscular Tomorrow-1000   isosorbide mononitrate  60 mg Oral BID   levothyroxine  88 mcg Oral Q0600   lidocaine  2 patch Transdermal Q24H   magnesium oxide  400 mg Oral BID   midodrine  10 mg Oral TID WC   montelukast  10 mg Oral QHS   ranolazine  500 mg Oral BID   Continuous Infusions:   LOS: 5 days     Tresa Moore, MD [facility]   If 7PM-7AM, please contact night-coverage  08/08/2023, 12:13 PM

## 2023-08-08 NOTE — Plan of Care (Signed)

## 2023-08-08 NOTE — Progress Notes (Signed)
Mobility Specialist - Progress Note   08/08/23 1439  Mobility  Activity Ambulated with assistance in room;Transferred from chair to bed  Level of Assistance Standby assist, set-up cues, supervision of patient - no hands on  Assistive Device Front wheel walker  Distance Ambulated (ft) 4 ft  Activity Response Tolerated well  Mobility visit 1 Mobility     Pt transferred chair-bed. Alarm set, needs in reach.    Filiberto Pinks Mobility Specialist 08/08/23, 2:39 PM

## 2023-08-08 NOTE — Progress Notes (Signed)
PT Cancellation Note  Patient Details Name: Abigail Ortega MRN: [id number] DOB: September 04, 1950   Cancelled Treatment:     Pt fatigued after working with Mobility tech x 2 today. Will attempt next available date/time per POC.    Jannet Askew 08/08/2023, 4:18 PM

## 2023-08-08 NOTE — Progress Notes (Signed)
Date of Admission:  08/02/2023   ID: Abigail Ortega is a 73 y.o. female Principal Problem:   C. difficile colitis Active Problems:   Chronic combined systolic and diastolic CHF (congestive heart failure) (HCC)   CAD (coronary artery disease)   Chronic hypotension   Hypokalemia   Severe hypothyroidism   Folate deficiency   Normocytic anemia   Hypomagnesemia    Subjective: Pt says she is starting to feel better 2 loose stools today Appetite better  Medications:   aspirin EC  81 mg Oral Daily   atorvastatin  80 mg Oral Daily   cholestyramine  4 g Oral BID   clopidogrel  75 mg Oral Daily   dicyclomine  10 mg Oral TID AC   enoxaparin (LOVENOX) injection  40 mg Subcutaneous Q24H   ferrous sulfate  325 mg Oral Daily   fidaxomicin  200 mg Oral BID   folic acid  1 mg Oral Daily   Gerhardt's butt cream   Topical QID   influenza vaccine adjuvanted  0.5 mL Intramuscular Tomorrow-1000   isosorbide mononitrate  60 mg Oral BID   levothyroxine  88 mcg Oral Q0600   lidocaine  2 patch Transdermal Q24H   magnesium oxide  400 mg Oral BID   midodrine  10 mg Oral TID WC   montelukast  10 mg Oral QHS   ranolazine  500 mg Oral BID    Objective: Vital signs in last 24 hours: Patient Vitals for the past 24 hrs:  BP Temp Temp src Pulse Resp SpO2 Weight  08/08/23 1407 93/70 97.9 F (36.6 C) Oral 80 18 98 % --  08/08/23 0733 104/76 98.4 F (36.9 C) Oral 89 16 98 % --  08/08/23 0500 -- -- -- -- -- -- 65 kg  08/08/23 0102 132/87 98.4 F (36.9 C) Oral 91 18 97 % --  08/07/23 2054 127/84 98.5 F (36.9 C) Oral 87 18 100 % --  08/07/23 1556 119/75 98 F (36.7 C) -- 79 18 100 % --      PHYSICAL EXAM:  General: Alert, cooperative, no distress, appears stated age.  Head: Normocephalic, without obvious abnormality, atraumatic. Eyes: Conjunctivae clear, anicteric sclerae. Pupils are equal ENT Nares normal. No drainage or sinus tenderness. Lips, mucosa, and tongue normal. No  Thrush Neck: Supple, symmetrical, no adenopathy, thyroid: non tender no carotid bruit and no JVD. Back: No CVA tenderness. Lungs: Clear to auscultation bilaterally. No Wheezing or Rhonchi. No rales. Heart: Regular rate and rhythm, no murmur, rub or gallop. Abdomen: Soft, non-tender,not distended. Bowel sounds normal. No masses Extremities: left colles fracture deformity Skin: No rashes or lesions. Or bruising Lymph: Cervical, supraclavicular normal. Neurologic: Grossly non-focal  Lab Results    Latest Ref Rng & Units 08/08/2023    8:38 AM 08/06/2023    5:26 AM 08/04/2023    4:16 AM  CBC  WBC 4.0 - 10.5 K/uL 8.3  7.7  6.0   Hemoglobin 12.0 - 15.0 g/dL 9.0  7.5  8.1   Hematocrit 36.0 - 46.0 % 25.7  21.8  24.0   Platelets 150 - 400 K/uL 502  471  479        Latest Ref Rng & Units 08/08/2023    8:38 AM 08/07/2023    4:37 AM 08/06/2023    5:26 AM  CMP  Glucose 70 - 99 mg/dL 84  78  75   BUN 8 - 23 mg/dL 6  6  7    Creatinine 0.44 -  1.00 mg/dL 7.82  9.56  2.13   Sodium 135 - 145 mmol/L 128  131  134   Potassium 3.5 - 5.1 mmol/L 3.6  3.4  3.5   Chloride 98 - 111 mmol/L 98  100  105   CO2 22 - 32 mmol/L 20  22  22    Calcium 8.9 - 10.3 mg/dL 8.5  8.2  7.5   Total Protein 6.5 - 8.1 g/dL   4.9   Total Bilirubin 0.0 - 1.2 mg/dL   0.6   Alkaline Phos 38 - 126 U/L   74   AST 15 - 41 U/L   17   ALT 0 - 44 U/L   8       Microbiology: Stool - cdiff antigen and PCR psitive    Assessment/Plan: Cdiff diarrhea- antigen positive, toxin negative and PCR positive Persistent cdiff Received 10 days of vanco Now on fidoxamicin Need to figure out the cause of significant unintentional weight loss, and chronic diarrhea if it is other than cdiff CT scan done on 1/21 showed thickening of the descending and sigmoid colon   Ca breast s.p rt mastectomy- triple positive was on letrozole fr 7 years and completed    Anemia   Severe hypothyroidism on synthroid  Discussed the management with the  patient

## 2023-08-09 DIAGNOSIS — R634 Abnormal weight loss: Secondary | ICD-10-CM | POA: Diagnosis not present

## 2023-08-09 DIAGNOSIS — A0472 Enterocolitis due to Clostridium difficile, not specified as recurrent: Secondary | ICD-10-CM | POA: Diagnosis not present

## 2023-08-09 DIAGNOSIS — R197 Diarrhea, unspecified: Secondary | ICD-10-CM

## 2023-08-09 LAB — MAGNESIUM: Magnesium: 1.9 mg/dL (ref 1.7–2.4)

## 2023-08-09 NOTE — Progress Notes (Signed)
Occupational Therapy Treatment Patient Details Name: Abigail Ortega MRN: [id number] DOB: 10/18/1950 Today's Date: 08/09/2023   History of present illness Pt is a 73 y.o. female presenting to hospital 08/02/23 from gastroenterology clinic with c/o hypotension, generalized weakness, and recurrent diarrhea.  Pt admitted with acute colitis, hypokalemia, chronic hypotension, severe hypothyroidism, CAD, and chronic combined systolic and diastolic CHF.  PMH includes C. Difficile colitis, CAD s/p dES, R breast CA s/p chemoradiation and mastectomy, hypothyroidism, htn, HLD, osteoporosis, anemia, CHF, IBS.   OT comments  Pt is supine in bed on arrival. Pleasant and agreeable to PT/OT co-tx session this afternoon after declining this therapist twice this morning. She reports pain to BLEs and "all over", but does not give a rating, just requests for her pain patches. RN notified via secure chat by PTA. Pt performed bed mobility with CGA, STS from EOB with CGA to RW, ambulated in room to bathroom and back to recliner using RW with Min/CGA as she fatigued. Min A was needed for toilet transfer d/t low height of toilet. Mod to Max A needed for peri-care following BM. Able to wash face seated on toilet with set up. Wide BOS needed to stand, then able to narrow once in standing. Pt stating mult times during session "my legs are about to give out." Pt left in recliner with all needs in place and will cont to require skilled acute OT services to maximize her safety and IND to return to PLOF.       If plan is discharge home, recommend the following:  A little help with walking and/or transfers;A lot of help with bathing/dressing/bathroom;Assistance with cooking/housework;Assist for transportation;Help with stairs or ramp for entrance   Equipment Recommendations  Other (comment) (defer to next venue)    Recommendations for Other Services      Precautions / Restrictions Precautions Precautions: Fall Precaution  Comments: No BP R UE Restrictions Weight Bearing Restrictions Per Provider Order: No Other Position/Activity Restrictions: Pt had been wearing splint on L wrist (d/t h/o fx).       Mobility Bed Mobility Overal bed mobility: Needs Assistance Bed Mobility: Supine to Sit     Supine to sit: Contact guard, HOB elevated, Used rails     General bed mobility comments: able to forward scoot to EOB    Transfers Overall transfer level: Needs assistance Equipment used: Rolling walker (2 wheels) Transfers: Sit to/from Stand Sit to Stand: Contact guard assist, Min assist           General transfer comment: Min to CGA for STS from EOB and Min A from toilet; ambulated in room with Min/CGA for safety using RW     Balance Overall balance assessment: Needs assistance Sitting-balance support: No upper extremity supported, Feet supported Sitting balance-Leahy Scale: Good     Standing balance support: Bilateral upper extremity supported, During functional activity, Reliant on assistive device for balance Standing balance-Leahy Scale: Fair Standing balance comment: reliant on RW for safety, wide BOS at times; BLE weakness limiting activity tolerance                           ADL either performed or assessed with clinical judgement   ADL Overall ADL's : Needs assistance/impaired                         Toilet Transfer: Minimal assistance;Regular Toilet;Rolling walker (2 wheels);Grab bars   Toileting- Clothing Manipulation and Hygiene:  Moderate assistance;Maximal assistance;Sit to/from stand;Sitting/lateral lean Toileting - Clothing Manipulation Details (indicate cue type and reason): pt performed some of the hygiene after BM in sitting then OT performed remainder in standing     Functional mobility during ADLs: Contact guard assist;Rolling walker (2 wheels);Minimal assistance      Extremity/Trunk Assessment              Vision       Perception      Praxis      Cognition Arousal: Alert Behavior During Therapy: WFL for tasks assessed/performed Overall Cognitive Status: No family/caregiver present to determine baseline cognitive functioning                                 General Comments: A&O to person, place, hospital, and general situation        Exercises      Shoulder Instructions       General Comments Pt educated on role of PT and benefits of OOB activity. Cues given for proper transfer technique and safe ambulation in room with RW    Pertinent Vitals/ Pain       Pain Assessment Pain Assessment: Faces Faces Pain Scale: Hurts little more Pain Location: LE's, whole body Pain Descriptors / Indicators: Aching, Discomfort, Sore Pain Intervention(s): Monitored during session, Patient requesting pain meds-RN notified  Home Living                                          Prior Functioning/Environment              Frequency  Min 1X/week        Progress Toward Goals  OT Goals(current goals can now be found in the care plan section)  Progress towards OT goals: Progressing toward goals  Acute Rehab OT Goals Patient Stated Goal: improve pain and strength OT Goal Formulation: With patient Time For Goal Achievement: 08/17/23 Potential to Achieve Goals: Good  Plan      Co-evaluation    PT/OT/SLP Co-Evaluation/Treatment: Yes Reason for Co-Treatment: For patient/therapist safety;To address functional/ADL transfers PT goals addressed during session: Mobility/safety with mobility;Balance        AM-PAC OT "6 Clicks" Daily Activity     Outcome Measure   Help from another person eating meals?: None Help from another person taking care of personal grooming?: A Little Help from another person toileting, which includes using toliet, bedpan, or urinal?: A Lot Help from another person bathing (including washing, rinsing, drying)?: A Lot Help from another person to put on and  taking off regular upper body clothing?: A Little Help from another person to put on and taking off regular lower body clothing?: A Lot 6 Click Score: 16    End of Session Equipment Utilized During Treatment: Rolling walker (2 wheels)  OT Visit Diagnosis: Other abnormalities of gait and mobility (R26.89);Muscle weakness (generalized) (M62.81)   Activity Tolerance Patient tolerated treatment well   Patient Left with call bell/phone within reach;with nursing/sitter in room;in chair;with chair alarm set   Nurse Communication          Time: 1447-1510 OT Time Calculation (min): 23 min  Charges: OT General Charges $OT Visit: 1 Visit OT Treatments $Self Care/Home Management : 8-22 mins  Kelton Bultman, OTR/L  08/09/23, 4:02 PM   Aanvi Voyles E Beyonca Wisz 08/09/2023, 3:59 PM

## 2023-08-09 NOTE — Progress Notes (Signed)
Date of Admission:  08/02/2023   ID: Abigail Ortega is a 73 y.o. female Principal Problem:   C. difficile colitis Active Problems:   Chronic combined systolic and diastolic CHF (congestive heart failure) (HCC)   CAD (coronary artery disease)   Chronic hypotension   Hypokalemia   Severe hypothyroidism   Folate deficiency   Normocytic anemia   Hypomagnesemia    Subjective: Pt states that she has more diarrhea today No pain abdomen Has a cough mucoid sputum  Medications:   aspirin EC  81 mg Oral Daily   atorvastatin  80 mg Oral Daily   cholestyramine  4 g Oral BID   clopidogrel  75 mg Oral Daily   dicyclomine  10 mg Oral TID AC   enoxaparin (LOVENOX) injection  40 mg Subcutaneous Q24H   ferrous sulfate  325 mg Oral Daily   fidaxomicin  200 mg Oral BID   folic acid  1 mg Oral Daily   Gerhardt's butt cream   Topical QID   influenza vaccine adjuvanted  0.5 mL Intramuscular Tomorrow-1000   isosorbide mononitrate  60 mg Oral BID   levothyroxine  88 mcg Oral Q0600   lidocaine  2 patch Transdermal Q24H   magnesium oxide  400 mg Oral BID   midodrine  10 mg Oral TID WC   montelukast  10 mg Oral QHS   ranolazine  500 mg Oral BID    Objective: Vital signs in last 24 hours: Patient Vitals for the past 24 hrs:  BP Temp Temp src Pulse Resp SpO2  08/09/23 0820 124/72 98.5 F (36.9 C) Oral 75 18 100 %  08/09/23 0346 115/71 98.5 F (36.9 C) Oral 81 18 100 %  08/08/23 2012 108/71 98.2 F (36.8 C) Oral 79 17 100 %      PHYSICAL EXAM:  General: Alert, cooperative, no distress, appears stated age.  Lungs: Clear to auscultation bilaterally. No Wheezing or Rhonchi. No rales. Heart: Regular rate and rhythm, no murmur, rub or gallop. Abdomen: Soft, non-tender,not distended. Bowel sounds normal. No masses Extremities: left colles fracture deformity Skin: No rashes or lesions. Or bruising Lymph: Cervical, supraclavicular normal. Neurologic: Grossly non-focal  Lab Results     Latest Ref Rng & Units 08/08/2023    8:38 AM 08/06/2023    5:26 AM 08/04/2023    4:16 AM  CBC  WBC 4.0 - 10.5 K/uL 8.3  7.7  6.0   Hemoglobin 12.0 - 15.0 g/dL 9.0  7.5  8.1   Hematocrit 36.0 - 46.0 % 25.7  21.8  24.0   Platelets 150 - 400 K/uL 502  471  479        Latest Ref Rng & Units 08/08/2023    8:38 AM 08/07/2023    4:37 AM 08/06/2023    5:26 AM  CMP  Glucose 70 - 99 mg/dL 84  78  75   BUN 8 - 23 mg/dL 6  6  7    Creatinine 0.44 - 1.00 mg/dL 1.61  0.96  0.45   Sodium 135 - 145 mmol/L 128  131  134   Potassium 3.5 - 5.1 mmol/L 3.6  3.4  3.5   Chloride 98 - 111 mmol/L 98  100  105   CO2 22 - 32 mmol/L 20  22  22    Calcium 8.9 - 10.3 mg/dL 8.5  8.2  7.5   Total Protein 6.5 - 8.1 g/dL   4.9   Total Bilirubin 0.0 - 1.2 mg/dL  0.6   Alkaline Phos 38 - 126 U/L   74   AST 15 - 41 U/L   17   ALT 0 - 44 U/L   8       Microbiology: Stool - cdiff antigen and PCR psitive- toxin negative    Assessment/Plan: Cdiff diarrhea- antigen positive, toxin negative and PCR positive Persistent cdiff Received 10 days of vanco Now on fidoxamicin She continues to have diarrhea CT scan done on 1/21 showed thickening of the descending and sigmoid colon She is on appropriate treatment but with persistent diarrhea- pt needs flex sig to see whether there is pseudomembrane and to exclude other causes Discontinue questran to prevent binding of fidoxamicin and also no improvement   Weight loss of 40 pounds? cause  Ca breast s.p rt mastectomy- triple positive was on letrozole fr 7 years and completed    Anemia   Severe hypothyroidism on synthroid  Discussed the management with the patient

## 2023-08-09 NOTE — Progress Notes (Signed)
PROGRESS NOTE    Abigail Ortega  HYQ:[id number] DOB: May 06, 1951 DOA: 08/02/2023 PCP: Margaretann Loveless, MD    Brief Narrative:  Abigail Ortega is a 73 y.o. female with medical history significant of recent C. difficile colitis s/p treatment with vancomycin (December 2024), CAD s/p DES, breast cancer s/p chemoradiation and mastectomy, hypothyroidism, hypertension, hyperlipidemia, osteoporosis, who presents to the ED due to diarrhea.   [name] states that since she was discharged from SNF approximately 10 days ago, she has been experiencing persistent diarrhea with some generalized abdominal pain.  She denies any nausea or vomiting but notes poor p.o. intake.  She has been experiencing fecal incontinence.  Denies melena or hematochezia, fever/chills.She does endorse shortness of breath with exertion only, but denies any chest pain, palpitations.  She notes that shortness of breath with exertion began after her recent hospitalization  Patient has had significant diarrhea after 10 days of vancomycin.  Infectious disease consulted 1/21.  GI consulted as well with plans for deferring endoscopic evaluation outpatient or until resolution of colitis.  CT abdomen pelvis on 1/21 demonstrates mild to moderate colitis without evidence of abscess or perforation.   Assessment & Plan:   Principal Problem:   C. difficile colitis Active Problems:   Hypokalemia   Chronic combined systolic and diastolic CHF (congestive heart failure) (HCC)   CAD (coronary artery disease)   Chronic hypotension   Severe hypothyroidism   Folate deficiency   Normocytic anemia   Hypomagnesemia  C. difficile colitis  intractable diarrhea 10 days diarrhea coming in.  C. difficile PCR positive.  Interestingly toxin was negative.  Patient completed 10 days of oral vancomycin and still had diarrhea.  Started on Dificid.  Infectious disease consulted 1/21.  GI consulted as well with recommendation to defer endoscopic  evaluation at this time.  CT abdomen pelvis with mild to moderate colitis without evidence of abscess or perforation.  Patient hemodynamically stable.  Belly soft on examination.  Flexi-Seal removed 1/22 Plan: Continue Dificid x 10 days total course Continue Questran twice daily Monitor for BM, breathing over interval Likely will defer endoscopic evaluation to outpatient  Hypokalemia QT prolongation Likely in the setting of intractable diarrhea Monitor potassium and replace as needed  Hypothyroidism Stable Repeat TSH and free T4 in 6 weeks as outpatient  Chronic hypotension Stable Continue midodrine  Coronary disease Stable. Continue home regimen  Chronic systolic and diastolic congestive heart failure  Appears hypovolemic secondary to intractable diarrhea Hold Lasix at this time  Normocytic anemia Suspect anemia of inflammation.  No evidence for acute iron deficiency.  DVT prophylaxis: SQ Lovenox Code Status: DNR Family Communication: None today Disposition Plan: Status is: Inpatient Remains inpatient appropriate because: Intractable diarrhea in the setting of C. difficile colitis   Level of care: Med-Surg  Consultants:  ID  Procedures:  None  Antimicrobials: Dificid   Subjective: Seen and examined.  Resting in bed.  No visible distress.  No complaints of pain.  Diarrhea improving.  Objective: Vitals:   08/08/23 1407 08/08/23 2012 08/09/23 0346 08/09/23 0820  BP: 93/70 108/71 115/71 124/72  Pulse: 80 79 81 75  Resp: 18 17 18 18   Temp: 97.9 F (36.6 C) 98.2 F (36.8 C) 98.5 F (36.9 C) 98.5 F (36.9 C)  TempSrc: Oral Oral Oral Oral  SpO2: 98% 100% 100% 100%  Weight:      Height:        Intake/Output Summary (Last 24 hours) at 08/09/2023 1109 Last data filed at  08/09/2023 1034 Gross per 24 hour  Intake 360 ml  Output --  Net 360 ml   Filed Weights   08/05/23 0500 08/07/23 0443 08/08/23 0500  Weight: 65.1 kg 69.7 kg 65 kg     Examination:  General exam: Frail appearing otherwise stable Respiratory system: Lungs clear.  Normal work of breathing.  Room air Cardiovascular system: S1-S2, RRR, no murmurs, no pedal edema Gastrointestinal system: Thin, soft, NT/ND, normal active bowel sounds Central nervous system: Alert and oriented. No focal neurological deficits. Extremities: Symmetric 5 x 5 power. Skin: No rashes, lesions or ulcers Psychiatry: Judgement and insight appear normal. Mood & affect appropriate.     Data Reviewed: I have personally reviewed following labs and imaging studies  CBC: Recent Labs  Lab 08/03/23 0537 08/04/23 0416 08/06/23 0526 08/08/23 0838  WBC 5.8 6.0 7.7 8.3  NEUTROABS  --  2.9 3.8 4.7  HGB 8.0* 8.1* 7.5* 9.0*  HCT 23.4* 24.0* 21.8* 25.7*  MCV 92.9 94.9 94.4 91.8  PLT 483* 479* 471* 502*   Basic Metabolic Panel: Recent Labs  Lab 08/03/23 0537 08/04/23 0416 08/05/23 0407 08/06/23 0526 08/07/23 0437 08/08/23 0838 08/09/23 0454  NA 135 133* 134* 134* 131* 128*  --   K 2.8* 3.3* 4.0 3.5 3.4* 3.6  --   CL 101 104 105 105 100 98  --   CO2 25 21* 21* 22 22 20*  --   GLUCOSE 97 79 79 75 78 84  --   BUN 9 6* 7* 7* 6* 6*  --   CREATININE 0.84 0.92 0.98 1.00 1.02* 1.08*  --   CALCIUM 7.5* 7.4* 6.8* 7.5* 8.2* 8.5*  --   MG 2.1 1.9 1.8 1.5* 1.7  --  1.9  PHOS 1.1* 1.9* 2.7  --   --   --   --    GFR: Estimated Creatinine Clearance: 41.7 mL/min (A) (by C-G formula based on SCr of 1.08 mg/dL (H)). Liver Function Tests: Recent Labs  Lab 08/06/23 0526  AST 17  ALT 8  ALKPHOS 74  BILITOT 0.6  PROT 4.9*  ALBUMIN 2.1*   No results for input(s): "LIPASE", "AMYLASE" in the last 168 hours. No results for input(s): "AMMONIA" in the last 168 hours. Coagulation Profile: No results for input(s): "INR", "PROTIME" in the last 168 hours.  Cardiac Enzymes: No results for input(s): "CKTOTAL", "CKMB", "CKMBINDEX", "TROPONINI" in the last 168 hours. BNP (last 3 results) No  results for input(s): "PROBNP" in the last 8760 hours. HbA1C: No results for input(s): "HGBA1C" in the last 72 hours. CBG: No results for input(s): "GLUCAP" in the last 168 hours. Lipid Profile: No results for input(s): "CHOL", "HDL", "LDLCALC", "TRIG", "CHOLHDL", "LDLDIRECT" in the last 72 hours. Thyroid Function Tests: No results for input(s): "TSH", "T4TOTAL", "FREET4", "T3FREE", "THYROIDAB" in the last 72 hours. Anemia Panel: No results for input(s): "VITAMINB12", "FOLATE", "FERRITIN", "TIBC", "IRON", "RETICCTPCT" in the last 72 hours. Sepsis Labs: No results for input(s): "PROCALCITON", "LATICACIDVEN" in the last 168 hours.   Recent Results (from the past 240 hours)  Culture, blood (Routine x 2)     Status: None   Collection Time: 08/02/23 10:42 AM   Specimen: BLOOD  Result Value Ref Range Status   Specimen Description BLOOD RIGHT [facility]  Final   Special Requests   Final    BOTTLES DRAWN AEROBIC AND ANAEROBIC Blood Culture adequate volume   Culture   Final    NO GROWTH 5 DAYS Performed at [facility],  [address] [place], [place] [address]    Report Status 08/07/2023 FINAL  Final  Culture, blood (Routine x 2)     Status: None   Collection Time: 08/02/23 11:54 AM   Specimen: Right Antecubital; Blood  Result Value Ref Range Status   Specimen Description RIGHT ANTECUBITAL  Final   Special Requests   Final    BOTTLES DRAWN AEROBIC AND ANAEROBIC Blood Culture results may not be optimal due to an inadequate volume of blood received in culture bottles   Culture   Final    NO GROWTH 5 DAYS Performed at [facility], [address], [place], [place] [address]    Report Status 08/07/2023 FINAL  Final  C Difficile Quick Screen w PCR reflex     Status: Abnormal   Collection Time: 08/02/23  2:05 PM  Result Value Ref Range Status   C Diff antigen POSITIVE (A) NEGATIVE Final   C Diff toxin NEGATIVE NEGATIVE Final   C Diff interpretation Results are  indeterminate. See PCR results.  Final    Comment: Performed at [facility], [address] [place], [place] [address]  Gastrointestinal Panel by PCR , Stool     Status: None   Collection Time: 08/02/23  2:05 PM  Result Value Ref Range Status   Campylobacter species NOT DETECTED NOT DETECTED Final   Plesimonas shigelloides NOT DETECTED NOT DETECTED Final   Salmonella species NOT DETECTED NOT DETECTED Final   Yersinia enterocolitica NOT DETECTED NOT DETECTED Final   Vibrio species NOT DETECTED NOT DETECTED Final   Vibrio cholerae NOT DETECTED NOT DETECTED Final   Enteroaggregative E coli (EAEC) NOT DETECTED NOT DETECTED Final   Enteropathogenic E coli (EPEC) NOT DETECTED NOT DETECTED Final   Enterotoxigenic E coli (ETEC) NOT DETECTED NOT DETECTED Final   Shiga like toxin producing E coli (STEC) NOT DETECTED NOT DETECTED Final   Shigella/Enteroinvasive E coli (EIEC) NOT DETECTED NOT DETECTED Final   Cryptosporidium NOT DETECTED NOT DETECTED Final   Cyclospora cayetanensis NOT DETECTED NOT DETECTED Final   Entamoeba histolytica NOT DETECTED NOT DETECTED Final   Giardia lamblia NOT DETECTED NOT DETECTED Final   Adenovirus F40/41 NOT DETECTED NOT DETECTED Final   Astrovirus NOT DETECTED NOT DETECTED Final   Norovirus GI/GII NOT DETECTED NOT DETECTED Final   Rotavirus A NOT DETECTED NOT DETECTED Final   Sapovirus (I, II, IV, and V) NOT DETECTED NOT DETECTED Final    Comment: Performed at [facility], [address] [place], [place] [address]  C. Diff by PCR, Reflexed     Status: Abnormal   Collection Time: 08/02/23  2:05 PM  Result Value Ref Range Status   Toxigenic C. Difficile by PCR POSITIVE (A) NEGATIVE Final    Comment: Positive for toxigenic C. difficile with little to no toxin production. Only treat if clinical presentation suggests symptomatic illness. Performed at [facility], [address], [place], [place] [address]          Radiology  Studies: CT ABDOMEN PELVIS W CONTRAST Result Date: 08/07/2023 CLINICAL DATA:  Severe diarrhea.  Recurrent C difficile colitis. EXAM: CT ABDOMEN AND PELVIS WITH CONTRAST TECHNIQUE: Multidetector CT imaging of the abdomen and pelvis was performed using the standard protocol following bolus administration of intravenous contrast. RADIATION DOSE REDUCTION: This exam was performed according to the departmental dose-optimization program which includes automated exposure control, adjustment of the mA and/or kV according to patient size and/or use of iterative reconstruction technique. CONTRAST:  OMNIPAQUE IOHEXOL 300 MG/ML  SOLN COMPARISON:  06/27/2023 FINDINGS: Lower Chest: No acute findings. Scarring and traction bronchiectasis again seen in the right middle lobe. Hepatobiliary: No suspicious hepatic masses identified. Gallbladder is unremarkable. No evidence of biliary ductal dilatation. Pancreas:  No mass or inflammatory changes. Spleen: Within normal limits in size and appearance. Adrenals/Urinary Tract: No suspicious masses identified. No evidence of ureteral calculi or hydronephrosis. Stomach/Bowel: Small hiatal hernia again seen. Mild wall thickening of distal esophagus is consistent with esophagitis. Mild to moderate wall thickening of the descending and rectosigmoid colon is seen, without significant change since prior study. No evidence of perforation or abscess. Vascular/Lymphatic: No pathologically enlarged lymph nodes. No acute vascular findings. Reproductive:  No mass or other significant abnormality. Other:  None. Musculoskeletal:  No suspicious bone lesions identified. IMPRESSION: Mild to moderate colitis involving the descending and rectosigmoid colon, without significant change since prior exam. No evidence of perforation or abscess. Small hiatal hernia, with distal esophagitis likely due to reflux. Electronically Signed   By: Danae Orleans M.D.   On: 08/07/2023 19:10        Scheduled  Meds:  aspirin EC  81 mg Oral Daily   atorvastatin  80 mg Oral Daily   cholestyramine  4 g Oral BID   clopidogrel  75 mg Oral Daily   dicyclomine  10 mg Oral TID AC   enoxaparin (LOVENOX) injection  40 mg Subcutaneous Q24H   ferrous sulfate  325 mg Oral Daily   fidaxomicin  200 mg Oral BID   folic acid  1 mg Oral Daily   Gerhardt's butt cream   Topical QID   influenza vaccine adjuvanted  0.5 mL Intramuscular Tomorrow-1000   isosorbide mononitrate  60 mg Oral BID   levothyroxine  88 mcg Oral Q0600   lidocaine  2 patch Transdermal Q24H   magnesium oxide  400 mg Oral BID   midodrine  10 mg Oral TID WC   montelukast  10 mg Oral QHS   ranolazine  500 mg Oral BID   Continuous Infusions:   LOS: 6 days     Tresa Moore, MD [facility]   If 7PM-7AM, please contact night-coverage  08/09/2023, 11:09 AM

## 2023-08-09 NOTE — Progress Notes (Signed)
Physical Therapy Treatment Patient Details Name: Abigail Ortega MRN: [id number] DOB: 14-Feb-1951 Today's Date: 08/09/2023   History of Present Illness Pt is a 73 y.o. female presenting to hospital 08/02/23 from gastroenterology clinic with c/o hypotension, generalized weakness, and recurrent diarrhea.  Pt admitted with acute colitis, hypokalemia, chronic hypotension, severe hypothyroidism, CAD, and chronic combined systolic and diastolic CHF.  PMH includes C. Difficile colitis, CAD s/p dES, R breast CA s/p chemoradiation and mastectomy, hypothyroidism, htn, HLD, osteoporosis, anemia, CHF, IBS.    PT Comments  Pt received in bed for OT/PT co-tx in order to safely assess pt's current functional level and provide safe mobility in room. Pt demonstrated ability to transfer to EOB with HOB raised and side rail with Supervision. Good unsupported sitting balance EOB. Sit<>stand from elevated surface with CGA, increased assist needed from lower surfaces due to B LE weakness. Pt completed gait training in room with RW, 20'x2 with close CGA due to occasional sudden weakness in LE's - per pt. Pt was able to maneuver around objects without LOB or buckling. Increased fatigue upon completion. Pt positioned in chair post session, all needs in reach. Initial recs for STR upon d/c remain appropriate.    If plan is discharge home, recommend the following: A little help with walking and/or transfers;A little help with bathing/dressing/bathroom;Assistance with cooking/housework;Assist for transportation;Help with stairs or ramp for entrance   Can travel by private vehicle     No  Equipment Recommendations  Other (comment) (TBD at next level of care)    Recommendations for Other Services       Precautions / Restrictions Precautions Precautions: Fall Precaution Comments: No BP R UE Restrictions Weight Bearing Restrictions Per Provider Order: No Other Position/Activity Restrictions: Pt had been wearing splint  on L wrist (d/t h/o fx).     Mobility  Bed Mobility Overal bed mobility: Needs Assistance Bed Mobility: Supine to Sit     Supine to sit: Contact guard, HOB elevated, Used rails     General bed mobility comments: Able to scoot self to EOB    Transfers Overall transfer level: Needs assistance Equipment used: Rolling walker (2 wheels) Transfers: Sit to/from Stand Sit to Stand: Contact guard assist, Min assist (CGA from raised surface, increased assist from low surface)           General transfer comment: Requires RW to steady self upon standing    Ambulation/Gait Ambulation/Gait assistance: Contact guard assist Gait Distance (Feet): 20 Feet Assistive device: Rolling walker (2 wheels) Gait Pattern/deviations: Step-through pattern, Decreased step length - right, Decreased step length - left, Decreased dorsiflexion - right, Decreased dorsiflexion - left, Narrow base of support Gait velocity: decr     General Gait Details: Pt with decreased heel strike bilaterally. Only tolerates short distances due to weakness and body aches. 20'x2 with RW   Stairs             Wheelchair Mobility     Tilt Bed    Modified Rankin (Stroke Patients Only)       Balance Overall balance assessment: Needs assistance Sitting-balance support: No upper extremity supported, Feet supported Sitting balance-Leahy Scale: Good     Standing balance support: Bilateral upper extremity supported, During functional activity, Reliant on assistive device for balance Standing balance-Leahy Scale: Fair Standing balance comment: Remains a high fall risk due to overall weakness and limited activity level  Cognition Arousal: Alert Behavior During Therapy: WFL for tasks assessed/performed Overall Cognitive Status: No family/caregiver present to determine baseline cognitive functioning                                 General Comments: A&O to  person, place, hospital, and general situation        Exercises      General Comments General comments (skin integrity, edema, etc.): Pt educated on role of PT and benefits of OOB activity. Cues given for proper transfer technique and safe ambulation in room with RW      Pertinent Vitals/Pain Pain Assessment Pain Assessment: Faces Faces Pain Scale: Hurts little more Pain Location: LE's, whole body Pain Descriptors / Indicators: Aching, Discomfort, Sore Pain Intervention(s): Patient requesting pain meds-RN notified    Home Living                          Prior Function            PT Goals (current goals can now be found in the care plan section) Acute Rehab PT Goals Patient Stated Goal: to improve strength and walking Progress towards PT goals: Progressing toward goals    Frequency    Min 1X/week      PT Plan      Co-evaluation PT/OT/SLP Co-Evaluation/Treatment: Yes Reason for Co-Treatment: For patient/therapist safety;To address functional/ADL transfers PT goals addressed during session: Mobility/safety with mobility;Balance        AM-PAC PT "6 Clicks" Mobility   Outcome Measure  Help needed turning from your back to your side while in a flat bed without using bedrails?: A Little Help needed moving from lying on your back to sitting on the side of a flat bed without using bedrails?: A Little Help needed moving to and from a bed to a chair (including a wheelchair)?: A Little Help needed standing up from a chair using your arms (e.g., wheelchair or bedside chair)?: A Little Help needed to walk in hospital room?: A Little Help needed climbing 3-5 steps with a railing? : A Lot 6 Click Score: 17    End of Session Equipment Utilized During Treatment: Gait belt Activity Tolerance: Patient tolerated treatment well Patient left: in chair;with call bell/phone within reach;with chair alarm set Nurse Communication: Mobility status;Patient requests pain  meds PT Visit Diagnosis: Other abnormalities of gait and mobility (R26.89);Muscle weakness (generalized) (M62.81)     Time: 1447-1510 PT Time Calculation (min) (ACUTE ONLY): 23 min  Charges:    $Gait Training: 8-22 mins PT General Charges $$ ACUTE PT VISIT: 1 Visit                    Zadie Cleverly, PTA  Jannet Askew 08/09/2023, 3:26 PM

## 2023-08-09 NOTE — Progress Notes (Signed)
PHARMACY CONSULT NOTE - FOLLOW UP  Pharmacy Consult for Electrolyte Monitoring and Replacement   Recent Labs: Potassium (mmol/L)  Date Value  08/08/2023 3.6  03/20/2014 3.4 (L)   Magnesium (mg/dL)  Date Value  08/65/7846 1.9  02/23/2014 1.7 (L)   Calcium (mg/dL)  Date Value  96/29/5284 8.5 (L)   Calcium, Total (mg/dL)  Date Value  13/24/4010 9.6   Albumin (g/dL)  Date Value  27/25/3664 2.1 (L)  11/24/2022 4.3  03/20/2014 3.4   Phosphorus (mg/dL)  Date Value  40/34/7425 2.7   Sodium (mmol/L)  Date Value  08/08/2023 128 (L)  11/24/2022 137  03/20/2014 135 (L)   Corrected Ca: 9.0     Diet: reg Fluids: none Pertinent Medications: fidaxomicin (Cdiff diarrhea),   Assessment: PM is a 73 yo female who presented for follow-up due to hx of C. Difficile. They have had severe diarrhea, weight-loss and weakness. They arrived hypotensive and confused today. Pharmacy has been consulted to manage this patient's electrolytes.   Goal of Therapy:  Electrolytes WNL   Plan:  No supplementation warranted at this time Will recheck BMP with AM labs given continued diarrhea   Merryl Hacker, PharmD 08/09/2023 7:24 AM

## 2023-08-09 NOTE — TOC Progression Note (Addendum)
Transition of Care [facility]) - Progression Note    Patient Details  Name: Abigail Ortega MRN: [id number] Date of Birth: Apr 22, 1951  Transition of Care [facility]) CM/SW Contact  Margarito Liner, LCSW Phone Number: 08/09/2023, 2:00 PM  Clinical Narrative: [name] can accept patient tomorrow if stable and auth approved. Patient is aware. CSW left voicemail for son.    3:21 pm: No call back from son yet. CSW started SNF insurance authorization.  Expected Discharge Plan: Skilled Nursing Facility Barriers to Discharge: Continued Medical Work up  Expected Discharge Plan and Services       Living arrangements for the past 2 months: Single Family Home                                       Social Determinants of Health (SDOH) Interventions SDOH Screenings   Food Insecurity: No Food Insecurity (08/03/2023)  Housing: Low Risk  (08/03/2023)  Transportation Needs: No Transportation Needs (08/03/2023)  Utilities: Not At Risk (08/03/2023)  Social Connections: Moderately Integrated (08/03/2023)  Tobacco Use: Medium Risk (08/02/2023)    Readmission Risk Interventions     No data to display

## 2023-08-10 DIAGNOSIS — R197 Diarrhea, unspecified: Secondary | ICD-10-CM | POA: Diagnosis not present

## 2023-08-10 DIAGNOSIS — A0472 Enterocolitis due to Clostridium difficile, not specified as recurrent: Secondary | ICD-10-CM | POA: Diagnosis not present

## 2023-08-10 DIAGNOSIS — R634 Abnormal weight loss: Secondary | ICD-10-CM | POA: Diagnosis not present

## 2023-08-10 LAB — BASIC METABOLIC PANEL
Anion gap: 10 (ref 5–15)
BUN: 8 mg/dL (ref 8–23)
CO2: 23 mmol/L (ref 22–32)
Calcium: 8.4 mg/dL — ABNORMAL LOW (ref 8.9–10.3)
Chloride: 99 mmol/L (ref 98–111)
Creatinine, Ser: 1.08 mg/dL — ABNORMAL HIGH (ref 0.44–1.00)
GFR, Estimated: 54 mL/min — ABNORMAL LOW (ref >60)
Glucose, Bld: 85 mg/dL (ref 70–99)
Potassium: 3.4 mmol/L — ABNORMAL LOW (ref 3.5–5.1)
Sodium: 132 mmol/L — ABNORMAL LOW (ref 135–145)

## 2023-08-10 MED ORDER — OXYCODONE HCL 5 MG PO TABS
5.0000 mg | ORAL_TABLET | ORAL | Status: DC | PRN
  Administered 2023-08-10 – 2023-08-11 (×3): 10 mg via ORAL
  Administered 2023-08-14: 5 mg via ORAL
  Administered 2023-08-17: 10 mg via ORAL
  Filled 2023-08-10 (×4): qty 2
  Filled 2023-08-10: qty 1

## 2023-08-10 MED ORDER — POTASSIUM CHLORIDE CRYS ER 20 MEQ PO TBCR
20.0000 meq | EXTENDED_RELEASE_TABLET | Freq: Once | ORAL | Status: AC
  Administered 2023-08-10: 20 meq via ORAL
  Filled 2023-08-10: qty 1

## 2023-08-10 NOTE — Progress Notes (Signed)
Date of Admission:  08/02/2023   ID: Abigail Ortega is a 73 y.o. female Principal Problem:   C. difficile colitis Active Problems:   Chronic combined systolic and diastolic CHF (congestive heart failure) (HCC)   CAD (coronary artery disease)   Chronic hypotension   Hypokalemia   Severe hypothyroidism   Folate deficiency   Normocytic anemia   Hypomagnesemia    Subjective: Pt states diarrhea better But had some nausea No pain abdomen   Medications:   aspirin EC  81 mg Oral Daily   atorvastatin  80 mg Oral Daily   clopidogrel  75 mg Oral Daily   dicyclomine  10 mg Oral TID AC   enoxaparin (LOVENOX) injection  40 mg Subcutaneous Q24H   ferrous sulfate  325 mg Oral Daily   fidaxomicin  200 mg Oral BID   folic acid  1 mg Oral Daily   Gerhardt's butt cream   Topical QID   influenza vaccine adjuvanted  0.5 mL Intramuscular Tomorrow-1000   isosorbide mononitrate  60 mg Oral BID   levothyroxine  88 mcg Oral Q0600   lidocaine  2 patch Transdermal Q24H   magnesium oxide  400 mg Oral BID   midodrine  10 mg Oral TID WC   montelukast  10 mg Oral QHS   ranolazine  500 mg Oral BID    Objective: Vital signs in last 24 hours: Patient Vitals for the past 24 hrs:  BP Temp Temp src Pulse Resp SpO2  08/10/23 0900 106/72 98.2 F (36.8 C) Oral 78 18 98 %  08/10/23 0356 101/66 98.5 F (36.9 C) Oral 88 20 99 %  08/09/23 2031 131/78 98.7 F (37.1 C) Oral 79 16 98 %  08/09/23 1700 129/73 98 F (36.7 C) Oral 78 16 97 %      PHYSICAL EXAM:  General: Alert, cooperative, no distress, appears stated age.  Lungs: Clear to auscultation bilaterally. No Wheezing or Rhonchi. No rales. Heart: Regular rate and rhythm, no murmur, rub or gallop. Abdomen: Soft, non-tender,not distended. Bowel sounds normal. No masses Extremities: left colles fracture deformity Skin: No rashes or lesions. Or bruising Lymph: Cervical, supraclavicular normal. Neurologic: Grossly non-focal  Lab Results     Latest Ref Rng & Units 08/08/2023    8:38 AM 08/06/2023    5:26 AM 08/04/2023    4:16 AM  CBC  WBC 4.0 - 10.5 K/uL 8.3  7.7  6.0   Hemoglobin 12.0 - 15.0 g/dL 9.0  7.5  8.1   Hematocrit 36.0 - 46.0 % 25.7  21.8  24.0   Platelets 150 - 400 K/uL 502  471  479        Latest Ref Rng & Units 08/10/2023    5:13 AM 08/08/2023    8:38 AM 08/07/2023    4:37 AM  CMP  Glucose 70 - 99 mg/dL 85  84  78   BUN 8 - 23 mg/dL 8  6  6    Creatinine 0.44 - 1.00 mg/dL 1.61  0.96  0.45   Sodium 135 - 145 mmol/L 132  128  131   Potassium 3.5 - 5.1 mmol/L 3.4  3.6  3.4   Chloride 98 - 111 mmol/L 99  98  100   CO2 22 - 32 mmol/L 23  20  22    Calcium 8.9 - 10.3 mg/dL 8.4  8.5  8.2       Microbiology: Stool - cdiff antigen and PCR psitive- toxin negative  Assessment/Plan: Cdiff diarrhea- antigen positive, toxin negative and PCR positive Persistent cdiff Received 10 days of vanco Now on fidoxamicin She continues to have diarrhea CT scan done on 1/21 showed thickening of the descending and sigmoid colon She is on appropriate treatment but with persistent diarrhea- if no improvement on Monday would recommend  flex sig to see whether there is pseudomembrane and to exclude other causes Discontinued questran to prevent binding of fidoxamicin and also no improvement   Weight loss of 40 pounds? cause  Ca breast s.p rt mastectomy- triple positive was on letrozole fr 7 years and completed    Anemia   Severe hypothyroidism on synthroid  Discussed the management with the patient and Hospitalist ID will follow her peripherally this weekend- call if needed

## 2023-08-10 NOTE — TOC Progression Note (Addendum)
Transition of Care [facility]) - Progression Note    Patient Details  Name: Abigail Ortega MRN: [id number] Date of Birth: 09-20-1950  [facility]) CM/SW Contact  Chapman Fitch, RN Phone Number: 08/10/2023, 9:47 AM  Clinical Narrative:     Insurance auth pending for [employer] updated  Update:  updated therapy notes uploaded to navi portal by [name] with [facility] Expected Discharge Plan: Skilled Nursing Facility Barriers to Discharge: Continued Medical Work up  Expected Discharge Plan and Services       Living arrangements for the past 2 months: Single Family Home                                       Social Determinants of Health (SDOH) Interventions SDOH Screenings   Food Insecurity: No Food Insecurity (08/03/2023)  Housing: Low Risk  (08/03/2023)  Transportation Needs: No Transportation Needs (08/03/2023)  Utilities: Not At Risk (08/03/2023)  Social Connections: Moderately Integrated (08/03/2023)  Tobacco Use: Medium Risk (08/02/2023)    Readmission Risk Interventions     No data to display

## 2023-08-10 NOTE — Plan of Care (Signed)

## 2023-08-10 NOTE — Progress Notes (Signed)
PHARMACY CONSULT NOTE - FOLLOW UP  Pharmacy Consult for Electrolyte Monitoring and Replacement   Recent Labs: Potassium (mmol/L)  Date Value  08/10/2023 3.4 (L)  03/20/2014 3.4 (L)   Magnesium (mg/dL)  Date Value  40/98/1191 1.9  02/23/2014 1.7 (L)   Calcium (mg/dL)  Date Value  47/82/9562 8.4 (L)   Calcium, Total (mg/dL)  Date Value  13/02/6577 9.6   Albumin (g/dL)  Date Value  46/96/2952 2.1 (L)  11/24/2022 4.3  03/20/2014 3.4   Phosphorus (mg/dL)  Date Value  84/13/2440 2.7   Sodium (mmol/L)  Date Value  08/10/2023 132 (L)  11/24/2022 137  03/20/2014 135 (L)   Corrected Ca: 9.0     Diet: reg Fluids: none Pertinent Medications: fidaxomicin (Cdiff diarrhea),   Assessment: PM is a 73 yo female who presented for follow-up due to hx of C. Difficile. They have had severe diarrhea, weight-loss and weakness. They arrived hypotensive and confused today. Pharmacy has been consulted to manage this patient's electrolytes.   Goal of Therapy:  Electrolytes WNL   Plan:  K 3.4, will give KCl 20 mEq PO x 1 Will recheck BMP, Mag, and Phos with AM labs given continued diarrhea   Merryl Hacker, PharmD 08/10/2023 7:25 AM

## 2023-08-10 NOTE — Progress Notes (Signed)
PROGRESS NOTE    JONNE ROTE  ZOX:[id number] DOB: May 23, 1951 DOA: 08/02/2023 PCP: Margaretann Loveless, MD    Brief Narrative:  AUNA MIKKELSEN is a 73 y.o. female with medical history significant of recent C. difficile colitis s/p treatment with vancomycin (December 2024), CAD s/p DES, breast cancer s/p chemoradiation and mastectomy, hypothyroidism, hypertension, hyperlipidemia, osteoporosis, who presents to the ED due to diarrhea.   [name] states that since she was discharged from SNF approximately 10 days ago, she has been experiencing persistent diarrhea with some generalized abdominal pain.  She denies any nausea or vomiting but notes poor p.o. intake.  She has been experiencing fecal incontinence.  Denies melena or hematochezia, fever/chills.She does endorse shortness of breath with exertion only, but denies any chest pain, palpitations.  She notes that shortness of breath with exertion began after her recent hospitalization  Patient has had significant diarrhea after 10 days of vancomycin.  Infectious disease consulted 1/21.  GI consulted as well with plans for deferring endoscopic evaluation outpatient or until resolution of colitis.  CT abdomen pelvis on 1/21 demonstrates mild to moderate colitis without evidence of abscess or perforation.   Assessment & Plan:   Principal Problem:   C. difficile colitis Active Problems:   Hypokalemia   Chronic combined systolic and diastolic CHF (congestive heart failure) (HCC)   CAD (coronary artery disease)   Chronic hypotension   Severe hypothyroidism   Folate deficiency   Normocytic anemia   Hypomagnesemia  C. difficile colitis  intractable diarrhea 10 days diarrhea coming in.  C. difficile PCR positive.  Interestingly toxin was negative.  Patient completed 10 days of oral vancomycin and still had diarrhea.  Started on Dificid.  Infectious disease consulted 1/21.  GI consulted as well with recommendation to defer endoscopic  evaluation at this time.  CT abdomen pelvis with mild to moderate colitis without evidence of abscess or perforation.  Patient hemodynamically stable.  Belly soft on examination.  Flexi-Seal removed 1/22 Plan: Continue Dificid x 10 days total course.  Last dose 1/25 Continue Questran twice daily Likely will defer endoscopic evaluation to outpatient Anticipate medical readiness for discharge in 24 to 48 hours  Hypokalemia QT prolongation Likely in the setting of intractable diarrhea Monitor potassium and replace as needed  Hypothyroidism Stable Repeat TSH and free T4 in 6 weeks as outpatient  Chronic hypotension Stable Continue midodrine  Coronary disease Stable. Continue home regimen  Chronic systolic and diastolic congestive heart failure  Appears hypovolemic secondary to intractable diarrhea Continue holding B6 for now  Normocytic anemia Suspect anemia of inflammation.  No evidence for acute iron deficiency.  DVT prophylaxis: SQ Lovenox Code Status: DNR Family Communication: None today Disposition Plan: Status is: Inpatient Remains inpatient appropriate because: Intractable diarrhea in the setting of C. difficile colitis   Level of care: Med-Surg  Consultants:  ID  Procedures:  None  Antimicrobials: Dificid   Subjective: Seen and examined.  Resting in bed.  No visible distress.  No complaints of pain.  Diarrhea improving. Objective: Vitals:   08/09/23 1700 08/09/23 2031 08/10/23 0356 08/10/23 0900  BP: 129/73 131/78 101/66 106/72  Pulse: 78 79 88 78  Resp: 16 16 20 18   Temp: 98 F (36.7 C) 98.7 F (37.1 C) 98.5 F (36.9 C) 98.2 F (36.8 C)  TempSrc: Oral Oral Oral Oral  SpO2: 97% 98% 99% 98%  Weight:      Height:        Intake/Output Summary (Last 24 hours)  at 08/10/2023 1303 Last data filed at 08/10/2023 1000 Gross per 24 hour  Intake 240 ml  Output --  Net 240 ml   Filed Weights   08/07/23 0443 08/08/23 0500 08/09/23 0700  Weight: 69.7  kg 65 kg 63.8 kg    Examination:  General exam: Appears frail Respiratory system: Lungs clear.  Normal work of breathing.  Room air Cardiovascular system: S1-S2, RRR, no murmurs, no pedal edema Gastrointestinal system: Thin, soft, NT/ND, normal bowel sounds Central nervous system: Alert and oriented. No focal neurological deficits. Extremities: Symmetric 5 x 5 power. Skin: No rashes, lesions or ulcers Psychiatry: Judgement and insight appear normal. Mood & affect appropriate.     Data Reviewed: I have personally reviewed following labs and imaging studies  CBC: Recent Labs  Lab 08/04/23 0416 08/06/23 0526 08/08/23 0838  WBC 6.0 7.7 8.3  NEUTROABS 2.9 3.8 4.7  HGB 8.1* 7.5* 9.0*  HCT 24.0* 21.8* 25.7*  MCV 94.9 94.4 91.8  PLT 479* 471* 502*   Basic Metabolic Panel: Recent Labs  Lab 08/04/23 0416 08/05/23 0407 08/06/23 0526 08/07/23 0437 08/08/23 0838 08/09/23 0454 08/10/23 0513  NA 133* 134* 134* 131* 128*  --  132*  K 3.3* 4.0 3.5 3.4* 3.6  --  3.4*  CL 104 105 105 100 98  --  99  CO2 21* 21* 22 22 20*  --  23  GLUCOSE 79 79 75 78 84  --  85  BUN 6* 7* 7* 6* 6*  --  8  CREATININE 0.92 0.98 1.00 1.02* 1.08*  --  1.08*  CALCIUM 7.4* 6.8* 7.5* 8.2* 8.5*  --  8.4*  MG 1.9 1.8 1.5* 1.7  --  1.9  --   PHOS 1.9* 2.7  --   --   --   --   --    GFR: Estimated Creatinine Clearance: 41.7 mL/min (A) (by C-G formula based on SCr of 1.08 mg/dL (H)). Liver Function Tests: Recent Labs  Lab 08/06/23 0526  AST 17  ALT 8  ALKPHOS 74  BILITOT 0.6  PROT 4.9*  ALBUMIN 2.1*   No results for input(s): "LIPASE", "AMYLASE" in the last 168 hours. No results for input(s): "AMMONIA" in the last 168 hours. Coagulation Profile: No results for input(s): "INR", "PROTIME" in the last 168 hours.  Cardiac Enzymes: No results for input(s): "CKTOTAL", "CKMB", "CKMBINDEX", "TROPONINI" in the last 168 hours. BNP (last 3 results) No results for input(s): "PROBNP" in the last 8760  hours. HbA1C: No results for input(s): "HGBA1C" in the last 72 hours. CBG: No results for input(s): "GLUCAP" in the last 168 hours. Lipid Profile: No results for input(s): "CHOL", "HDL", "LDLCALC", "TRIG", "CHOLHDL", "LDLDIRECT" in the last 72 hours. Thyroid Function Tests: No results for input(s): "TSH", "T4TOTAL", "FREET4", "T3FREE", "THYROIDAB" in the last 72 hours. Anemia Panel: No results for input(s): "VITAMINB12", "FOLATE", "FERRITIN", "TIBC", "IRON", "RETICCTPCT" in the last 72 hours. Sepsis Labs: No results for input(s): "PROCALCITON", "LATICACIDVEN" in the last 168 hours.   Recent Results (from the past 240 hours)  Culture, blood (Routine x 2)     Status: None   Collection Time: 08/02/23 10:42 AM   Specimen: BLOOD  Result Value Ref Range Status   Specimen Description BLOOD RIGHT [facility]  Final   Special Requests   Final    BOTTLES DRAWN AEROBIC AND ANAEROBIC Blood Culture adequate volume   Culture   Final    NO GROWTH 5 DAYS Performed at [facility], [address], [place], [place] [address]    Report Status 08/07/2023 FINAL  Final  Culture, blood (Routine x 2)     Status: None   Collection Time: 08/02/23 11:54 AM   Specimen: Right Antecubital; Blood  Result Value Ref Range Status   Specimen Description RIGHT ANTECUBITAL  Final   Special Requests   Final    BOTTLES DRAWN AEROBIC AND ANAEROBIC Blood Culture results may not be optimal due to an inadequate volume of blood received in culture bottles   Culture   Final    NO GROWTH 5 DAYS Performed at [facility], [address] [place], [place] [address]    Report Status 08/07/2023 FINAL  Final  C Difficile Quick Screen w PCR reflex     Status: Abnormal   Collection Time: 08/02/23  2:05 PM  Result Value Ref Range Status   C Diff antigen POSITIVE (A) NEGATIVE Final   C Diff toxin NEGATIVE NEGATIVE Final   C Diff interpretation Results are indeterminate. See PCR results.  Final    Comment:  Performed at [facility], [address] [place], [place] [address]  Gastrointestinal Panel by PCR , Stool     Status: None   Collection Time: 08/02/23  2:05 PM  Result Value Ref Range Status   Campylobacter species NOT DETECTED NOT DETECTED Final   Plesimonas shigelloides NOT DETECTED NOT DETECTED Final   Salmonella species NOT DETECTED NOT DETECTED Final   Yersinia enterocolitica NOT DETECTED NOT DETECTED Final   Vibrio species NOT DETECTED NOT DETECTED Final   Vibrio cholerae NOT DETECTED NOT DETECTED Final   Enteroaggregative E coli (EAEC) NOT DETECTED NOT DETECTED Final   Enteropathogenic E coli (EPEC) NOT DETECTED NOT DETECTED Final   Enterotoxigenic E coli (ETEC) NOT DETECTED NOT DETECTED Final   Shiga like toxin producing E coli (STEC) NOT DETECTED NOT DETECTED Final   Shigella/Enteroinvasive E coli (EIEC) NOT DETECTED NOT DETECTED Final   Cryptosporidium NOT DETECTED NOT DETECTED Final   Cyclospora cayetanensis NOT DETECTED NOT DETECTED Final   Entamoeba histolytica NOT DETECTED NOT DETECTED Final   Giardia lamblia NOT DETECTED NOT DETECTED Final   Adenovirus F40/41 NOT DETECTED NOT DETECTED Final   Astrovirus NOT DETECTED NOT DETECTED Final   Norovirus GI/GII NOT DETECTED NOT DETECTED Final   Rotavirus A NOT DETECTED NOT DETECTED Final   Sapovirus (I, II, IV, and V) NOT DETECTED NOT DETECTED Final    Comment: Performed at [facility], [address] [place], [place] [address]  C. Diff by PCR, Reflexed     Status: Abnormal   Collection Time: 08/02/23  2:05 PM  Result Value Ref Range Status   Toxigenic C. Difficile by PCR POSITIVE (A) NEGATIVE Final    Comment: Positive for toxigenic C. difficile with little to no toxin production. Only treat if clinical presentation suggests symptomatic illness. Performed at [facility], [address] [place], [place] [address]          Radiology Studies: No results found.       Scheduled  Meds:  aspirin EC  81 mg Oral Daily   atorvastatin  80 mg Oral Daily   clopidogrel  75 mg Oral Daily   dicyclomine  10 mg Oral TID AC   enoxaparin (LOVENOX) injection  40 mg Subcutaneous Q24H   ferrous sulfate  325 mg Oral Daily   fidaxomicin  200 mg Oral BID   folic acid  1 mg Oral Daily   Gerhardt's  butt cream   Topical QID   influenza vaccine adjuvanted  0.5 mL Intramuscular Tomorrow-1000   isosorbide mononitrate  60 mg Oral BID   levothyroxine  88 mcg Oral Q0600   lidocaine  2 patch Transdermal Q24H   magnesium oxide  400 mg Oral BID   midodrine  10 mg Oral TID WC   montelukast  10 mg Oral QHS   ranolazine  500 mg Oral BID   Continuous Infusions:   LOS: 7 days     Tresa Moore, MD [facility]   If 7PM-7AM, please contact night-coverage  08/10/2023, 1:03 PM

## 2023-08-11 DIAGNOSIS — A0472 Enterocolitis due to Clostridium difficile, not specified as recurrent: Secondary | ICD-10-CM | POA: Diagnosis not present

## 2023-08-11 LAB — BASIC METABOLIC PANEL
Anion gap: 11 (ref 5–15)
BUN: 9 mg/dL (ref 8–23)
CO2: 22 mmol/L (ref 22–32)
Calcium: 8.3 mg/dL — ABNORMAL LOW (ref 8.9–10.3)
Chloride: 96 mmol/L — ABNORMAL LOW (ref 98–111)
Creatinine, Ser: 1.1 mg/dL — ABNORMAL HIGH (ref 0.44–1.00)
GFR, Estimated: 53 mL/min — ABNORMAL LOW (ref >60)
Glucose, Bld: 77 mg/dL (ref 70–99)
Potassium: 3.9 mmol/L (ref 3.5–5.1)
Sodium: 129 mmol/L — ABNORMAL LOW (ref 135–145)

## 2023-08-11 LAB — MAGNESIUM: Magnesium: 2.2 mg/dL (ref 1.7–2.4)

## 2023-08-11 LAB — PHOSPHORUS: Phosphorus: 2.5 mg/dL (ref 2.5–4.6)

## 2023-08-11 NOTE — Progress Notes (Signed)
PHARMACY CONSULT NOTE - FOLLOW UP  Pharmacy Consult for Electrolyte Monitoring and Replacement   Recent Labs: Potassium (mmol/L)  Date Value  08/11/2023 3.9  03/20/2014 3.4 (L)   Magnesium (mg/dL)  Date Value  60/45/4098 2.2  02/23/2014 1.7 (L)   Calcium (mg/dL)  Date Value  11/91/4782 8.3 (L)   Calcium, Total (mg/dL)  Date Value  95/62/1308 9.6   Albumin (g/dL)  Date Value  65/78/4696 2.1 (L)  11/24/2022 4.3  03/20/2014 3.4   Phosphorus (mg/dL)  Date Value  29/52/8413 2.5   Sodium (mmol/L)  Date Value  08/11/2023 129 (L)  11/24/2022 137  03/20/2014 135 (L)   Corrected Ca: 9.0     Diet: reg Fluids: none Pertinent Medications: fidaxomicin (Cdiff diarrhea),   Assessment: PM is a 73 yo female who presented for follow-up due to hx of C. Difficile. They have had severe diarrhea, weight-loss and weakness. They arrived hypotensive and confused today. Pharmacy has been consulted to manage this patient's electrolytes.   Goal of Therapy:  Electrolytes WNL   Plan:  No supplementation warranted at this time Will recheck BMP with AM labs given continued diarrhea   Bettey Costa, PharmD 08/11/2023 8:35 AM

## 2023-08-11 NOTE — Plan of Care (Signed)

## 2023-08-11 NOTE — Progress Notes (Signed)
PROGRESS NOTE    [name]  ZOX:[id number] DOB: Apr 10, 1951 DOA: 08/02/2023 PCP: Margaretann Loveless, MD    Brief Narrative:  Abigail Ortega is a 73 y.o. female with medical history significant of recent C. difficile colitis s/p treatment with vancomycin (December 2024), CAD s/p DES, breast cancer s/p chemoradiation and mastectomy, hypothyroidism, hypertension, hyperlipidemia, osteoporosis, who presents to the ED due to diarrhea.   Abigail Ortega states that since she was discharged from SNF approximately 10 days ago, she has been experiencing persistent diarrhea with some generalized abdominal pain.  She denies any nausea or vomiting but notes poor p.o. intake.  She has been experiencing fecal incontinence.  Denies melena or hematochezia, fever/chills.She does endorse shortness of breath with exertion only, but denies any chest pain, palpitations.  She notes that shortness of breath with exertion began after her recent hospitalization  Patient has had significant diarrhea after 10 days of vancomycin.  Infectious disease consulted 1/21.  GI consulted as well with plans for deferring endoscopic evaluation outpatient or until resolution of colitis.  CT abdomen pelvis on 1/21 demonstrates mild to moderate colitis without evidence of abscess or perforation.   Assessment & Plan:   Principal Problem:   C. difficile colitis Active Problems:   Hypokalemia   Chronic combined systolic and diastolic CHF (congestive heart failure) (HCC)   CAD (coronary artery disease)   Chronic hypotension   Severe hypothyroidism   Folate deficiency   Normocytic anemia   Hypomagnesemia  C. difficile colitis  intractable diarrhea 10 days diarrhea coming in.  C. difficile PCR positive.  Interestingly toxin was negative.  Patient completed 10 days of oral vancomycin and still had diarrhea.  Started on Dificid.  Infectious disease consulted 1/21.  GI consulted as well with recommendation to defer endoscopic  evaluation at this time.  CT abdomen pelvis with mild to moderate colitis without evidence of abscess or perforation.  Patient hemodynamically stable.  Belly soft on examination.  Flexi-Seal removed 1/22 Plan: Continue Dificid x 10 days total course.  Last dose 1/25 Questran discontinued due to concern that it may be binding Dificid Will need endoscopic evaluation either outpatient or while admitted Reevaluate on Monday 1/27  Hypokalemia QT prolongation Likely in the setting of intractable diarrhea Monitor potassium and replace as needed  Hypothyroidism Stable Repeat TSH and free T4 in 6 weeks as outpatient  Chronic hypotension Stable Continue midodrine  Coronary disease Stable. Continue home regimen  Chronic systolic and diastolic congestive heart failure  Appears hypovolemic secondary to intractable diarrhea Continue holding Lasix for now  Normocytic anemia Suspect anemia of inflammation.  No evidence for acute iron deficiency.  DVT prophylaxis: SQ Lovenox Code Status: DNR Family Communication: None today Disposition Plan: Status is: Inpatient Remains inpatient appropriate because: Intractable diarrhea in the setting of C. difficile colitis   Level of care: Med-Surg  Consultants:  ID  Procedures:  None  Antimicrobials: Dificid   Subjective: Seen and examined.  Resting in bed.  No visible distress.  No complaints of pain.  Objective: Vitals:   08/10/23 1800 08/10/23 1953 08/11/23 0525 08/11/23 0746  BP: 107/88 127/71 111/61 103/68  Pulse: 81 81 75 79  Resp:  16 18 16   Temp: 98 F (36.7 C) 98.7 F (37.1 C) 98.4 F (36.9 C) 98.2 F (36.8 C)  TempSrc:  Oral Oral   SpO2:  99% 100% 100%  Weight:      Height:       No intake or output data  in the 24 hours ending 08/11/23 1150  Filed Weights   08/07/23 0443 08/08/23 0500 08/09/23 0700  Weight: 69.7 kg 65 kg 63.8 kg    Examination:  General exam: NAD Respiratory system: Lung clear.  Normal work  of breathing.  Room air Cardiovascular system: S1-S2, RRR, no murmurs, no pedal edema Gastrointestinal system: Thin, soft, NT/ND, normal bowel sounds Central nervous system: Alert and oriented. No focal neurological deficits. Extremities: Symmetric 5 x 5 power. Skin: No rashes, lesions or ulcers Psychiatry: Judgement and insight appear normal. Mood & affect appropriate.     Data Reviewed: I have personally reviewed following labs and imaging studies  CBC: Recent Labs  Lab 08/06/23 0526 08/08/23 0838  WBC 7.7 8.3  NEUTROABS 3.8 4.7  HGB 7.5* 9.0*  HCT 21.8* 25.7*  MCV 94.4 91.8  PLT 471* 502*   Basic Metabolic Panel: Recent Labs  Lab 08/05/23 0407 08/06/23 0526 08/07/23 0437 08/08/23 0838 08/09/23 0454 08/10/23 0513 08/11/23 0411  NA 134* 134* 131* 128*  --  132* 129*  K 4.0 3.5 3.4* 3.6  --  3.4* 3.9  CL 105 105 100 98  --  99 96*  CO2 21* 22 22 20*  --  23 22  GLUCOSE 79 75 78 84  --  85 77  BUN 7* 7* 6* 6*  --  8 9  CREATININE 0.98 1.00 1.02* 1.08*  --  1.08* 1.10*  CALCIUM 6.8* 7.5* 8.2* 8.5*  --  8.4* 8.3*  MG 1.8 1.5* 1.7  --  1.9  --  2.2  PHOS 2.7  --   --   --   --   --  2.5   GFR: Estimated Creatinine Clearance: 41 mL/min (A) (by C-G formula based on SCr of 1.1 mg/dL (H)). Liver Function Tests: Recent Labs  Lab 08/06/23 0526  AST 17  ALT 8  ALKPHOS 74  BILITOT 0.6  PROT 4.9*  ALBUMIN 2.1*   No results for input(s): "LIPASE", "AMYLASE" in the last 168 hours. No results for input(s): "AMMONIA" in the last 168 hours. Coagulation Profile: No results for input(s): "INR", "PROTIME" in the last 168 hours.  Cardiac Enzymes: No results for input(s): "CKTOTAL", "CKMB", "CKMBINDEX", "TROPONINI" in the last 168 hours. BNP (last 3 results) No results for input(s): "PROBNP" in the last 8760 hours. HbA1C: No results for input(s): "HGBA1C" in the last 72 hours. CBG: No results for input(s): "GLUCAP" in the last 168 hours. Lipid Profile: No results for  input(s): "CHOL", "HDL", "LDLCALC", "TRIG", "CHOLHDL", "LDLDIRECT" in the last 72 hours. Thyroid Function Tests: No results for input(s): "TSH", "T4TOTAL", "FREET4", "T3FREE", "THYROIDAB" in the last 72 hours. Anemia Panel: No results for input(s): "VITAMINB12", "FOLATE", "FERRITIN", "TIBC", "IRON", "RETICCTPCT" in the last 72 hours. Sepsis Labs: No results for input(s): "PROCALCITON", "LATICACIDVEN" in the last 168 hours.   Recent Results (from the past 240 hours)  Culture, blood (Routine x 2)     Status: None   Collection Time: 08/02/23 10:42 AM   Specimen: BLOOD  Result Value Ref Range Status   Specimen Description BLOOD RIGHT [facility]  Final   Special Requests   Final    BOTTLES DRAWN AEROBIC AND ANAEROBIC Blood Culture adequate volume   Culture   Final    NO GROWTH 5 DAYS Performed at [facility], [address], [place], [place] [address]    Report Status 08/07/2023 FINAL  Final  Culture, blood (Routine x 2)     Status: None  Collection Time: 08/02/23 11:54 AM   Specimen: Right Antecubital; Blood  Result Value Ref Range Status   Specimen Description RIGHT ANTECUBITAL  Final   Special Requests   Final    BOTTLES DRAWN AEROBIC AND ANAEROBIC Blood Culture results may not be optimal due to an inadequate volume of blood received in culture bottles   Culture   Final    NO GROWTH 5 DAYS Performed at [facility], [address] [place], [place] [address]    Report Status 08/07/2023 FINAL  Final  C Difficile Quick Screen w PCR reflex     Status: Abnormal   Collection Time: 08/02/23  2:05 PM  Result Value Ref Range Status   C Diff antigen POSITIVE (A) NEGATIVE Final   C Diff toxin NEGATIVE NEGATIVE Final   C Diff interpretation Results are indeterminate. See PCR results.  Final    Comment: Performed at [facility], [address] [place], [place] [address]  Gastrointestinal Panel by PCR , Stool     Status: None   Collection Time: 08/02/23  2:05 PM   Result Value Ref Range Status   Campylobacter species NOT DETECTED NOT DETECTED Final   Plesimonas shigelloides NOT DETECTED NOT DETECTED Final   Salmonella species NOT DETECTED NOT DETECTED Final   Yersinia enterocolitica NOT DETECTED NOT DETECTED Final   Vibrio species NOT DETECTED NOT DETECTED Final   Vibrio cholerae NOT DETECTED NOT DETECTED Final   Enteroaggregative E coli (EAEC) NOT DETECTED NOT DETECTED Final   Enteropathogenic E coli (EPEC) NOT DETECTED NOT DETECTED Final   Enterotoxigenic E coli (ETEC) NOT DETECTED NOT DETECTED Final   Shiga like toxin producing E coli (STEC) NOT DETECTED NOT DETECTED Final   Shigella/Enteroinvasive E coli (EIEC) NOT DETECTED NOT DETECTED Final   Cryptosporidium NOT DETECTED NOT DETECTED Final   Cyclospora cayetanensis NOT DETECTED NOT DETECTED Final   Entamoeba histolytica NOT DETECTED NOT DETECTED Final   Giardia lamblia NOT DETECTED NOT DETECTED Final   Adenovirus F40/41 NOT DETECTED NOT DETECTED Final   Astrovirus NOT DETECTED NOT DETECTED Final   Norovirus GI/GII NOT DETECTED NOT DETECTED Final   Rotavirus A NOT DETECTED NOT DETECTED Final   Sapovirus (I, II, IV, and V) NOT DETECTED NOT DETECTED Final    Comment: Performed at [facility], [address] [place], [place] [address]  C. Diff by PCR, Reflexed     Status: Abnormal   Collection Time: 08/02/23  2:05 PM  Result Value Ref Range Status   Toxigenic C. Difficile by PCR POSITIVE (A) NEGATIVE Final    Comment: Positive for toxigenic C. difficile with little to no toxin production. Only treat if clinical presentation suggests symptomatic illness. Performed at [facility], [address] [place], [place] [address]          Radiology Studies: No results found.       Scheduled Meds:  aspirin EC  81 mg Oral Daily   atorvastatin  80 mg Oral Daily   clopidogrel  75 mg Oral Daily   dicyclomine  10 mg Oral TID AC   enoxaparin (LOVENOX) injection  40 mg  Subcutaneous Q24H   ferrous sulfate  325 mg Oral Daily   fidaxomicin  200 mg Oral BID   folic acid  1 mg Oral Daily   Gerhardt's butt cream   Topical QID   influenza vaccine adjuvanted  0.5 mL Intramuscular Tomorrow-1000   isosorbide mononitrate  60 mg Oral BID   levothyroxine  88 mcg Oral Q0600   lidocaine  2 patch Transdermal Q24H   magnesium oxide  400 mg Oral BID   midodrine  10 mg Oral TID WC   montelukast  10 mg Oral QHS   ranolazine  500 mg Oral BID   Continuous Infusions:   LOS: 8 days     Tresa Moore, MD [facility]   If 7PM-7AM, please contact night-coverage  08/11/2023, 11:50 AM

## 2023-08-12 DIAGNOSIS — A0472 Enterocolitis due to Clostridium difficile, not specified as recurrent: Secondary | ICD-10-CM | POA: Diagnosis not present

## 2023-08-12 DIAGNOSIS — R197 Diarrhea, unspecified: Secondary | ICD-10-CM | POA: Insufficient documentation

## 2023-08-12 LAB — CBC WITH DIFFERENTIAL/PLATELET
Abs Immature Granulocytes: 0.06 10*3/uL (ref 0.00–0.07)
Basophils Absolute: 0.1 10*3/uL (ref 0.0–0.1)
Basophils Relative: 1 %
Eosinophils Absolute: 0.1 10*3/uL (ref 0.0–0.5)
Eosinophils Relative: 1 %
HCT: 27.6 % — ABNORMAL LOW (ref 36.0–46.0)
Hemoglobin: 9.4 g/dL — ABNORMAL LOW (ref 12.0–15.0)
Immature Granulocytes: 1 %
Lymphocytes Relative: 28 %
Lymphs Abs: 2.1 10*3/uL (ref 0.7–4.0)
MCH: 31.8 pg (ref 26.0–34.0)
MCHC: 34.1 g/dL (ref 30.0–36.0)
MCV: 93.2 fL (ref 80.0–100.0)
Monocytes Absolute: 0.4 10*3/uL (ref 0.1–1.0)
Monocytes Relative: 5 %
Neutro Abs: 4.7 10*3/uL (ref 1.7–7.7)
Neutrophils Relative %: 64 %
Platelets: 408 10*3/uL — ABNORMAL HIGH (ref 150–400)
RBC: 2.96 MIL/uL — ABNORMAL LOW (ref 3.87–5.11)
RDW: 15.4 % (ref 11.5–15.5)
WBC: 7.4 10*3/uL (ref 4.0–10.5)
nRBC: 0 % (ref 0.0–0.2)

## 2023-08-12 LAB — SEDIMENTATION RATE: Sed Rate: 35 mm/h — ABNORMAL HIGH (ref 0–30)

## 2023-08-12 LAB — BASIC METABOLIC PANEL
Anion gap: 10 (ref 5–15)
BUN: 10 mg/dL (ref 8–23)
CO2: 24 mmol/L (ref 22–32)
Calcium: 8.3 mg/dL — ABNORMAL LOW (ref 8.9–10.3)
Chloride: 97 mmol/L — ABNORMAL LOW (ref 98–111)
Creatinine, Ser: 1.03 mg/dL — ABNORMAL HIGH (ref 0.44–1.00)
GFR, Estimated: 57 mL/min — ABNORMAL LOW (ref >60)
Glucose, Bld: 83 mg/dL (ref 70–99)
Potassium: 3.8 mmol/L (ref 3.5–5.1)
Sodium: 131 mmol/L — ABNORMAL LOW (ref 135–145)

## 2023-08-12 LAB — MAGNESIUM: Magnesium: 2.4 mg/dL (ref 1.7–2.4)

## 2023-08-12 LAB — PHOSPHORUS: Phosphorus: 2.3 mg/dL — ABNORMAL LOW (ref 2.5–4.6)

## 2023-08-12 LAB — C-REACTIVE PROTEIN: CRP: 1.6 mg/dL — ABNORMAL HIGH (ref <1.0)

## 2023-08-12 MED ORDER — SODIUM CHLORIDE 0.9 % IV SOLN: INTRAVENOUS | Status: DC

## 2023-08-12 MED ORDER — POTASSIUM & SODIUM PHOSPHATES 280-160-250 MG PO PACK
2.0000 | PACK | Freq: Two times a day (BID) | ORAL | Status: AC
  Administered 2023-08-12 (×2): 2 via ORAL
  Filled 2023-08-12 (×2): qty 2

## 2023-08-12 MED ORDER — PEG 3350-KCL-NA BICARB-NACL 420 G PO SOLR
4000.0000 mL | Freq: Once | ORAL | Status: AC
  Administered 2023-08-12: 4000 mL via ORAL
  Filled 2023-08-12: qty 4000

## 2023-08-12 NOTE — Progress Notes (Signed)
PROGRESS NOTE    Abigail Ortega  AVW:[id number] DOB: 1950-12-29 DOA: 08/02/2023 PCP: Margaretann Loveless, MD    Brief Narrative:  Abigail Ortega is a 73 y.o. female with medical history significant of recent C. difficile colitis s/p treatment with vancomycin (December 2024), CAD s/p DES, breast cancer s/p chemoradiation and mastectomy, hypothyroidism, hypertension, hyperlipidemia, osteoporosis, who presents to the ED due to diarrhea.   Abigail Ortega states that since she was discharged from SNF approximately 10 days ago, she has been experiencing persistent diarrhea with some generalized abdominal pain.  She denies any nausea or vomiting but notes poor p.o. intake.  She has been experiencing fecal incontinence.  Denies melena or hematochezia, fever/chills.She does endorse shortness of breath with exertion only, but denies any chest pain, palpitations.  She notes that shortness of breath with exertion began after her recent hospitalization  Patient has had significant diarrhea after 10 days of vancomycin.  Infectious disease consulted 1/21.  GI consulted as well with plans for deferring endoscopic evaluation outpatient or until resolution of colitis.  CT abdomen pelvis on 1/21 demonstrates mild to moderate colitis without evidence of abscess or perforation.  1/26: Persistence of diarrhea despite completion of the daptomycin and oral vancomycin.  No blood in stool.  Reengage with ID and GI today.  Plan for EGD and flexible sigmoidoscopy 1/27.   Assessment & Plan:   Principal Problem:   C. difficile colitis Active Problems:   Hypokalemia   Chronic combined systolic and diastolic CHF (congestive heart failure) (HCC)   CAD (coronary artery disease)   Chronic hypotension   Severe hypothyroidism   Folate deficiency   Normocytic anemia   Hypomagnesemia   Diarrhea of presumed infectious origin  C. difficile colitis  intractable diarrhea 10 days diarrhea coming in.  C. difficile PCR  positive.  Interestingly toxin was negative.  Patient completed 10 days of oral vancomycin and still had diarrhea.  Started on Dificid.  Infectious disease consulted 1/21.  GI consulted as well with recommendation to defer endoscopic evaluation at this time.  CT abdomen pelvis with mild to moderate colitis without evidence of abscess or perforation.  Patient hemodynamically stable.  Belly soft on examination.  Flexi-Seal removed 1/22 Plan: Completed course of Dificid.  Questran discontinued.  Magnesium discontinued.  Per persistent diarrhea.  GI reengaged.  Case discussed with Dr. Tobi Bastos.  Will plan for upper endoscopy and flexible sigmoidoscopy 1/27.  Recommend biopsies for workup of microscopic colitis.  In the meantime we will check celiac, fecal calprotectin, CRP  Hypokalemia QT prolongation Likely in the setting of intractable diarrhea Monitor potassium and replace as needed  Hypothyroidism Stable Repeat TSH and free T4 in 6 weeks as outpatient  Chronic hypotension Stable Continue midodrine  Coronary disease Stable. Continue home regimen  Chronic systolic and diastolic congestive heart failure  Appears hypovolemic secondary to intractable diarrhea Hold Lasix in the setting of persistent diarrhea  Normocytic anemia Suspect anemia of inflammation.  No evidence for acute iron deficiency.  DVT prophylaxis: SQ Lovenox Code Status: DNR Family Communication: None today Disposition Plan: Status is: Inpatient Remains inpatient appropriate because: Intractable diarrhea   Level of care: Med-Surg  Consultants:  ID  Procedures:  None  Antimicrobials: Dificid   Subjective: Seen and examined.  Resting in bed.  No complaints of pain.  Objective: Vitals:   08/11/23 2014 08/12/23 0317 08/12/23 0500 08/12/23 0743  BP: 127/83 128/76  (!) 90/58  Pulse: 72 76  78  Resp: 18 18  18  Temp: 98.4 F (36.9 C) 98.4 F (36.9 C)  98.6 F (37 C)  TempSrc: Oral Oral  Oral  SpO2: 99%  100%  100%  Weight:   64.9 kg   Height:        Intake/Output Summary (Last 24 hours) at 08/12/2023 1216 Last data filed at 08/12/2023 1023 Gross per 24 hour  Intake 0 ml  Output --  Net 0 ml    Filed Weights   08/08/23 0500 08/09/23 0700 08/12/23 0500  Weight: 65 kg 63.8 kg 64.9 kg    Examination:  General exam: No acute distress Respiratory system: Lung clear.  Normal work of breathing.  Room air Cardiovascular system: S1-S2, RRR, no murmurs, no pedal edema Gastrointestinal system: Thin, soft, NT/ND, normal bowel sounds Central nervous system: Alert and oriented. No focal neurological deficits. Extremities: Symmetric 5 x 5 power. Skin: No rashes, lesions or ulcers Psychiatry: Judgement and insight appear normal. Mood & affect appropriate.     Data Reviewed: I have personally reviewed following labs and imaging studies  CBC: Recent Labs  Lab 08/06/23 0526 08/08/23 0838 08/12/23 0926  WBC 7.7 8.3 7.4  NEUTROABS 3.8 4.7 4.7  HGB 7.5* 9.0* 9.4*  HCT 21.8* 25.7* 27.6*  MCV 94.4 91.8 93.2  PLT 471* 502* 408*   Basic Metabolic Panel: Recent Labs  Lab 08/06/23 0526 08/07/23 0437 08/08/23 0838 08/09/23 0454 08/10/23 0513 08/11/23 0411 08/12/23 0412  NA 134* 131* 128*  --  132* 129* 131*  K 3.5 3.4* 3.6  --  3.4* 3.9 3.8  CL 105 100 98  --  99 96* 97*  CO2 22 22 20*  --  23 22 24   GLUCOSE 75 78 84  --  85 77 83  BUN 7* 6* 6*  --  8 9 10   CREATININE 1.00 1.02* 1.08*  --  1.08* 1.10* 1.03*  CALCIUM 7.5* 8.2* 8.5*  --  8.4* 8.3* 8.3*  MG 1.5* 1.7  --  1.9  --  2.2 2.4  PHOS  --   --   --   --   --  2.5 2.3*   GFR: Estimated Creatinine Clearance: 43.8 mL/min (A) (by C-G formula based on SCr of 1.03 mg/dL (H)). Liver Function Tests: Recent Labs  Lab 08/06/23 0526  AST 17  ALT 8  ALKPHOS 74  BILITOT 0.6  PROT 4.9*  ALBUMIN 2.1*   No results for input(s): "LIPASE", "AMYLASE" in the last 168 hours. No results for input(s): "AMMONIA" in the last 168  hours. Coagulation Profile: No results for input(s): "INR", "PROTIME" in the last 168 hours.  Cardiac Enzymes: No results for input(s): "CKTOTAL", "CKMB", "CKMBINDEX", "TROPONINI" in the last 168 hours. BNP (last 3 results) No results for input(s): "PROBNP" in the last 8760 hours. HbA1C: No results for input(s): "HGBA1C" in the last 72 hours. CBG: No results for input(s): "GLUCAP" in the last 168 hours. Lipid Profile: No results for input(s): "CHOL", "HDL", "LDLCALC", "TRIG", "CHOLHDL", "LDLDIRECT" in the last 72 hours. Thyroid Function Tests: No results for input(s): "TSH", "T4TOTAL", "FREET4", "T3FREE", "THYROIDAB" in the last 72 hours. Anemia Panel: No results for input(s): "VITAMINB12", "FOLATE", "FERRITIN", "TIBC", "IRON", "RETICCTPCT" in the last 72 hours. Sepsis Labs: No results for input(s): "PROCALCITON", "LATICACIDVEN" in the last 168 hours.   Recent Results (from the past 240 hours)  C Difficile Quick Screen w PCR reflex     Status: Abnormal   Collection Time: 08/02/23  2:05 PM  Result Value Ref  Range Status   C Diff antigen POSITIVE (A) NEGATIVE Final   C Diff toxin NEGATIVE NEGATIVE Final   C Diff interpretation Results are indeterminate. See PCR results.  Final    Comment: Performed at [facility], [address] [place], [place] [address]  Gastrointestinal Panel by PCR , Stool     Status: None   Collection Time: 08/02/23  2:05 PM  Result Value Ref Range Status   Campylobacter species NOT DETECTED NOT DETECTED Final   Plesimonas shigelloides NOT DETECTED NOT DETECTED Final   Salmonella species NOT DETECTED NOT DETECTED Final   Yersinia enterocolitica NOT DETECTED NOT DETECTED Final   Vibrio species NOT DETECTED NOT DETECTED Final   Vibrio cholerae NOT DETECTED NOT DETECTED Final   Enteroaggregative E coli (EAEC) NOT DETECTED NOT DETECTED Final   Enteropathogenic E coli (EPEC) NOT DETECTED NOT DETECTED Final   Enterotoxigenic E coli (ETEC) NOT  DETECTED NOT DETECTED Final   Shiga like toxin producing E coli (STEC) NOT DETECTED NOT DETECTED Final   Shigella/Enteroinvasive E coli (EIEC) NOT DETECTED NOT DETECTED Final   Cryptosporidium NOT DETECTED NOT DETECTED Final   Cyclospora cayetanensis NOT DETECTED NOT DETECTED Final   Entamoeba histolytica NOT DETECTED NOT DETECTED Final   Giardia lamblia NOT DETECTED NOT DETECTED Final   Adenovirus F40/41 NOT DETECTED NOT DETECTED Final   Astrovirus NOT DETECTED NOT DETECTED Final   Norovirus GI/GII NOT DETECTED NOT DETECTED Final   Rotavirus A NOT DETECTED NOT DETECTED Final   Sapovirus (I, II, IV, and V) NOT DETECTED NOT DETECTED Final    Comment: Performed at [facility], [address] [place], [place] [address]  C. Diff by PCR, Reflexed     Status: Abnormal   Collection Time: 08/02/23  2:05 PM  Result Value Ref Range Status   Toxigenic C. Difficile by PCR POSITIVE (A) NEGATIVE Final    Comment: Positive for toxigenic C. difficile with little to no toxin production. Only treat if clinical presentation suggests symptomatic illness. Performed at [facility], [address], [place], [place] [address]          Radiology Studies: No results found.       Scheduled Meds:  aspirin EC  81 mg Oral Daily   atorvastatin  80 mg Oral Daily   clopidogrel  75 mg Oral Daily   dicyclomine  10 mg Oral TID AC   enoxaparin (LOVENOX) injection  40 mg Subcutaneous Q24H   ferrous sulfate  325 mg Oral Daily   folic acid  1 mg Oral Daily   Gerhardt's butt cream   Topical QID   influenza vaccine adjuvanted  0.5 mL Intramuscular Tomorrow-1000   isosorbide mononitrate  60 mg Oral BID   levothyroxine  88 mcg Oral Q0600   lidocaine  2 patch Transdermal Q24H   midodrine  10 mg Oral TID WC   montelukast  10 mg Oral QHS   polyethylene glycol-electrolytes  4,000 mL Oral Once   potassium & sodium phosphates  2 packet Oral BID WC   ranolazine  500 mg Oral BID   Continuous  Infusions:  sodium chloride 20 mL/hr at 08/12/23 1202     LOS: 9 days     Tresa Moore, MD [facility]   If 7PM-7AM, please contact night-coverage  08/12/2023, 12:16 PM

## 2023-08-12 NOTE — Plan of Care (Signed)

## 2023-08-12 NOTE — Progress Notes (Signed)
PHARMACY CONSULT NOTE - FOLLOW UP  Pharmacy Consult for Electrolyte Monitoring and Replacement   Recent Labs: Potassium (mmol/L)  Date Value  08/12/2023 3.8  03/20/2014 3.4 (L)   Magnesium (mg/dL)  Date Value  16/04/9603 2.4  02/23/2014 1.7 (L)   Calcium (mg/dL)  Date Value  54/03/8118 8.3 (L)   Calcium, Total (mg/dL)  Date Value  14/78/2956 9.6   Albumin (g/dL)  Date Value  21/30/8657 2.1 (L)  11/24/2022 4.3  03/20/2014 3.4   Phosphorus (mg/dL)  Date Value  84/69/6295 2.3 (L)   Sodium (mmol/L)  Date Value  08/12/2023 131 (L)  11/24/2022 137  03/20/2014 135 (L)   Corrected Ca: 9.0     Diet: reg Fluids: none Pertinent Medications: fidaxomicin (Cdiff diarrhea),   Assessment: PM is a 73 yo female who presented for follow-up due to hx of C. Difficile. They have had severe diarrhea, weight-loss and weakness. They arrived hypotensive and confused today. Pharmacy has been consulted to manage this patient's electrolytes.   Goal of Therapy:  Electrolytes WNL   Plan:  Phos-Nak 2 packets x 2 doses ordered Will recheck BMP with AM labs given continued diarrhea   Bettey Costa, PharmD 08/12/2023 8:59 AM

## 2023-08-12 NOTE — Progress Notes (Addendum)
Wyline Mood , MD [address], [place], [place], [address] [address], [place], [place], [address] Phone: [phone]  Fax: [phone]   Abigail Ortega is being followed for c diff diarrhea   Subjective:  Still continues to have diarrhea.  No blood in the stool.  Denies any abdominal pain.  Objective: Vital signs in last 24 hours: Vitals:   08/11/23 2014 08/12/23 0317 08/12/23 0500 08/12/23 0743  BP: 127/83 128/76  (!) 90/58  Pulse: 72 76  78  Resp: 18 18  18   Temp: 98.4 F (36.9 C) 98.4 F (36.9 C)  98.6 F (37 C)  TempSrc: Oral Oral  Oral  SpO2: 99% 100%  100%  Weight:   64.9 kg   Height:       Weight change:   Intake/Output Summary (Last 24 hours) at 08/12/2023 3295 Last data filed at 08/11/2023 1856 Gross per 24 hour  Intake 0 ml  Output --  Net 0 ml     Exam: Heart:: Regular rate and rhythm Lungs: normal Abdomen: soft, nontender, normal bowel sounds   Lab Results: @LABTEST2 @ Micro Results: Recent Results (from the past 240 hours)  Culture, blood (Routine x 2)     Status: None   Collection Time: 08/02/23 10:42 AM   Specimen: BLOOD  Result Value Ref Range Status   Specimen Description BLOOD RIGHT [facility]  Final   Special Requests   Final    BOTTLES DRAWN AEROBIC AND ANAEROBIC Blood Culture adequate volume   Culture   Final    NO GROWTH 5 DAYS Performed at [facility], [address] [place], [place] [address]    Report Status 08/07/2023 FINAL  Final  Culture, blood (Routine x 2)     Status: None   Collection Time: 08/02/23 11:54 AM   Specimen: Right Antecubital; Blood  Result Value Ref Range Status   Specimen Description RIGHT ANTECUBITAL  Final   Special Requests   Final    BOTTLES DRAWN AEROBIC AND ANAEROBIC Blood Culture results may not be optimal due to an inadequate volume of blood received in culture bottles   Culture   Final    NO GROWTH 5 DAYS Performed at [facility], [address] [place], [place] [address]    Report Status 08/07/2023 FINAL  Final  C Difficile Quick Screen w PCR reflex     Status: Abnormal   Collection Time: 08/02/23  2:05 PM  Result Value Ref Range Status   C Diff antigen POSITIVE (A) NEGATIVE Final   C Diff toxin NEGATIVE NEGATIVE Final   C Diff interpretation Results are indeterminate. See PCR results.  Final    Comment: Performed at [facility], [address] [place], [place] [address]  Gastrointestinal Panel by PCR , Stool     Status: None   Collection Time: 08/02/23  2:05 PM  Result Value Ref Range Status   Campylobacter species NOT DETECTED NOT DETECTED Final   Plesimonas shigelloides NOT DETECTED NOT DETECTED Final   Salmonella species NOT DETECTED NOT DETECTED Final   Yersinia enterocolitica NOT DETECTED NOT DETECTED Final   Vibrio species NOT DETECTED NOT DETECTED Final   Vibrio cholerae NOT DETECTED NOT DETECTED Final   Enteroaggregative E coli (EAEC) NOT DETECTED NOT DETECTED Final   Enteropathogenic E coli (EPEC) NOT DETECTED NOT DETECTED Final   Enterotoxigenic E coli (ETEC) NOT DETECTED NOT DETECTED Final   Shiga like toxin producing E coli (STEC) NOT  DETECTED NOT DETECTED Final   Shigella/Enteroinvasive E coli (EIEC) NOT DETECTED NOT DETECTED Final   Cryptosporidium NOT DETECTED NOT DETECTED Final   Cyclospora cayetanensis NOT DETECTED NOT DETECTED Final   Entamoeba histolytica NOT DETECTED NOT DETECTED Final   Giardia lamblia NOT DETECTED NOT DETECTED Final   Adenovirus F40/41 NOT DETECTED NOT DETECTED Final   Astrovirus NOT DETECTED NOT DETECTED Final   Norovirus GI/GII NOT DETECTED NOT DETECTED Final   Rotavirus A NOT DETECTED NOT DETECTED Final   Sapovirus (I, II, IV, and V) NOT DETECTED NOT DETECTED Final    Comment: Performed at [facility], [address] [place], [place] [address]  C. Diff by PCR, Reflexed     Status: Abnormal   Collection Time: 08/02/23  2:05 PM  Result Value Ref Range Status    Toxigenic C. Difficile by PCR POSITIVE (A) NEGATIVE Final    Comment: Positive for toxigenic C. difficile with little to no toxin production. Only treat if clinical presentation suggests symptomatic illness. Performed at [facility], [address], [place], [place] [address]    Studies/Results: No results found. Medications: I have reviewed the patient's current medications. Scheduled Meds:  aspirin EC  81 mg Oral Daily   atorvastatin  80 mg Oral Daily   clopidogrel  75 mg Oral Daily   dicyclomine  10 mg Oral TID AC   enoxaparin (LOVENOX) injection  40 mg Subcutaneous Q24H   ferrous sulfate  325 mg Oral Daily   folic acid  1 mg Oral Daily   Gerhardt's butt cream   Topical QID   influenza vaccine adjuvanted  0.5 mL Intramuscular Tomorrow-1000   isosorbide mononitrate  60 mg Oral BID   levothyroxine  88 mcg Oral Q0600   lidocaine  2 patch Transdermal Q24H   midodrine  10 mg Oral TID WC   montelukast  10 mg Oral QHS   potassium & sodium phosphates  2 packet Oral BID WC   ranolazine  500 mg Oral BID   Continuous Infusions: PRN Meds:.acetaminophen **OR** acetaminophen, ondansetron (ZOFRAN) IV, ondansetron **OR** [DISCONTINUED] ondansetron (ZOFRAN) IV, oxyCODONE, traZODone   Assessment: Principal Problem:   C. difficile colitis Active Problems:   Chronic combined systolic and diastolic CHF (congestive heart failure) (HCC)   CAD (coronary artery disease)   Chronic hypotension   Hypokalemia   Severe hypothyroidism   Folate deficiency   Normocytic anemia   Hypomagnesemia   Abigail Ortega 73 y.o. female with a history of breast cancer, C. difficile diarrhea.  In December 2024 with vancomycin hypothyroidism admitted to the hospital with diarrhea C. difficile testing was positive but toxin negative diarrhea ongoing for over 9 months she was scheduled for outpatient EGD and colonoscopy due to weight loss she canceled the procedures stool for GI PCR was negative.  CT  scan in December 2024 showed wall thickening of the distal descending colon compatible with inflammatory or infectious colitis.  CT scan on 08/07/2023 showed mild to moderate colitis involving the descending and rectosigmoid colon without significant change since prior exam.  Started on fidaxomicin.  I have been asked to see the patient today for persistence of diarrhea.  Differentials for the diarrhea could be at this point of time postinfectious IBS but in view of weight loss there would be a concern for inflammatory bowel disease such as ulcerative colitis versus microscopic colitis.  About 10% of patients with inflammatory bowel disease the presenting condition can be C. difficile diarrhea.  Plan: 1.  We will plan for an upper endoscopy and colonoscopy as an inpatient tomorrow with Dr Servando Snare  .  Depending on the results of sigmoidoscopy  we can decide timing of a colonoscopy in the future.  Would recommend taking biopsies based on appearance rule out microscopic colitis.  I would also recommend we plan for stool transplant with the stool pill as an outpatient to prevent recurrence of C. difficile as she would be high risk for recurrence.  She is on clopidogrel but it should not preclude [place] from taking a few biopsies  2.  In addition suggest to check celiac serology, fecal calprotectin and obtain a baseline CRP.   I have discussed alternative options, risks & benefits,  which include, but are not limited to, bleeding, infection, perforation,respiratory complication & drug reaction.  The patient agrees with this plan & written consent will be obtained.      LOS: 9 days   Wyline Mood, MD 08/12/2023, 9:38 AM

## 2023-08-13 ENCOUNTER — Other Ambulatory Visit: Payer: Self-pay

## 2023-08-13 ENCOUNTER — Encounter
Admission: EM | Disposition: A | Discharge status: Skilled Nursing Facility | Payer: Self-pay | Source: Ambulatory Visit | Attending: Internal Medicine

## 2023-08-13 ENCOUNTER — Other Ambulatory Visit: Payer: Self-pay | Admitting: Gastroenterology

## 2023-08-13 ENCOUNTER — Encounter: Payer: Self-pay | Admitting: Internal Medicine

## 2023-08-13 ENCOUNTER — Inpatient Hospital Stay: Payer: Medicare HMO | Admitting: General Practice

## 2023-08-13 DIAGNOSIS — R197 Diarrhea, unspecified: Secondary | ICD-10-CM | POA: Diagnosis not present

## 2023-08-13 DIAGNOSIS — K5730 Diverticulosis of large intestine without perforation or abscess without bleeding: Secondary | ICD-10-CM

## 2023-08-13 DIAGNOSIS — R634 Abnormal weight loss: Secondary | ICD-10-CM | POA: Diagnosis not present

## 2023-08-13 DIAGNOSIS — A0472 Enterocolitis due to Clostridium difficile, not specified as recurrent: Secondary | ICD-10-CM | POA: Diagnosis not present

## 2023-08-13 DIAGNOSIS — K209 Esophagitis, unspecified without bleeding: Secondary | ICD-10-CM

## 2023-08-13 DIAGNOSIS — K449 Diaphragmatic hernia without obstruction or gangrene: Secondary | ICD-10-CM

## 2023-08-13 HISTORY — PX: ESOPHAGOGASTRODUODENOSCOPY (EGD) WITH PROPOFOL: SHX5813

## 2023-08-13 HISTORY — PX: BIOPSY: SHX5522

## 2023-08-13 HISTORY — PX: FLEXIBLE SIGMOIDOSCOPY: SHX5431

## 2023-08-13 LAB — BASIC METABOLIC PANEL
Anion gap: 12 (ref 5–15)
BUN: 8 mg/dL (ref 8–23)
CO2: 23 mmol/L (ref 22–32)
Calcium: 8 mg/dL — ABNORMAL LOW (ref 8.9–10.3)
Chloride: 97 mmol/L — ABNORMAL LOW (ref 98–111)
Creatinine, Ser: 1.01 mg/dL — ABNORMAL HIGH (ref 0.44–1.00)
GFR, Estimated: 59 mL/min — ABNORMAL LOW (ref >60)
Glucose, Bld: 78 mg/dL (ref 70–99)
Potassium: 3.6 mmol/L (ref 3.5–5.1)
Sodium: 132 mmol/L — ABNORMAL LOW (ref 135–145)

## 2023-08-13 LAB — PHOSPHORUS: Phosphorus: 2.2 mg/dL — ABNORMAL LOW (ref 2.5–4.6)

## 2023-08-13 LAB — MAGNESIUM: Magnesium: 2.1 mg/dL (ref 1.7–2.4)

## 2023-08-13 SURGERY — ESOPHAGOGASTRODUODENOSCOPY (EGD) WITH PROPOFOL
Anesthesia: General

## 2023-08-13 MED ORDER — POTASSIUM & SODIUM PHOSPHATES 280-160-250 MG PO PACK
2.0000 | PACK | ORAL | Status: AC
  Administered 2023-08-13: 2 via ORAL
  Filled 2023-08-13 (×2): qty 2

## 2023-08-13 MED ORDER — LIDOCAINE HCL (CARDIAC) PF 100 MG/5ML IV SOSY
PREFILLED_SYRINGE | INTRAVENOUS | Status: DC | PRN
  Administered 2023-08-13: 60 mg via INTRAVENOUS

## 2023-08-13 MED ORDER — PROPOFOL 10 MG/ML IV BOLUS
INTRAVENOUS | Status: DC | PRN
  Administered 2023-08-13: 20 mg via INTRAVENOUS

## 2023-08-13 MED ORDER — PROPOFOL 500 MG/50ML IV EMUL
INTRAVENOUS | Status: DC | PRN
  Administered 2023-08-13: 50 ug/kg/min via INTRAVENOUS

## 2023-08-13 MED ORDER — EPHEDRINE SULFATE-NACL 50-0.9 MG/10ML-% IV SOSY
PREFILLED_SYRINGE | INTRAVENOUS | Status: DC | PRN
  Administered 2023-08-13: 10 mg via INTRAVENOUS

## 2023-08-13 MED ORDER — DEXMEDETOMIDINE HCL IN NACL 80 MCG/20ML IV SOLN
INTRAVENOUS | Status: DC | PRN
  Administered 2023-08-13: 12 ug via INTRAVENOUS
  Administered 2023-08-13: 8 ug via INTRAVENOUS

## 2023-08-13 NOTE — Progress Notes (Signed)
Date of Admission:  08/02/2023   ID: Abigail Ortega is a 73 y.o. female Principal Problem:   C. difficile colitis Active Problems:   Chronic combined systolic and diastolic CHF (congestive heart failure) (HCC)   CAD (coronary artery disease)   Chronic hypotension   Hypokalemia   Severe hypothyroidism   Folate deficiency   Normocytic anemia   Hypomagnesemia   Diarrhea of presumed infectious origin    Subjective: Pt had colonoscopy today No diarrhea , as she has not eaten anything   Medications:   aspirin EC  81 mg Oral Daily   atorvastatin  80 mg Oral Daily   clopidogrel  75 mg Oral Daily   dicyclomine  10 mg Oral TID AC   enoxaparin (LOVENOX) injection  40 mg Subcutaneous Q24H   ferrous sulfate  325 mg Oral Daily   folic acid  1 mg Oral Daily   Gerhardt's butt cream   Topical QID   influenza vaccine adjuvanted  0.5 mL Intramuscular Tomorrow-1000   isosorbide mononitrate  60 mg Oral BID   levothyroxine  88 mcg Oral Q0600   lidocaine  2 patch Transdermal Q24H   midodrine  10 mg Oral TID WC   montelukast  10 mg Oral QHS   potassium & sodium phosphates  2 packet Oral Q4H   ranolazine  500 mg Oral BID    Objective: Vital signs in last 24 hours: Patient Vitals for the past 24 hrs:  BP Temp Temp src Pulse Resp SpO2 Weight  08/13/23 0420 -- -- -- -- -- -- 62.1 kg  08/13/23 0150 103/62 98.7 F (37.1 C) Oral 82 18 99 % --  08/12/23 1953 121/81 99.2 F (37.3 C) Oral 87 16 97 % --  08/12/23 1527 92/67 98.4 F (36.9 C) Oral 81 17 99 % --      PHYSICAL EXAM:  General: Alert, cooperative, no distress, appears stated age.  Lungs: Clear to auscultation bilaterally. No Wheezing or Rhonchi. No rales. Heart: Regular rate and rhythm, no murmur, rub or gallop. Abdomen: Soft, non-tender,not distended. Bowel sounds normal. No masses Extremities: left colles fracture deformity Skin: No rashes or lesions. Or bruising Lymph: Cervical, supraclavicular normal. Neurologic:  Grossly non-focal  Lab Results    Latest Ref Rng & Units 08/12/2023    9:26 AM 08/08/2023    8:38 AM 08/06/2023    5:26 AM  CBC  WBC 4.0 - 10.5 K/uL 7.4  8.3  7.7   Hemoglobin 12.0 - 15.0 g/dL 9.4  9.0  7.5   Hematocrit 36.0 - 46.0 % 27.6  25.7  21.8   Platelets 150 - 400 K/uL 408  502  471        Latest Ref Rng & Units 08/13/2023    4:38 AM 08/12/2023    4:12 AM 08/11/2023    4:11 AM  CMP  Glucose 70 - 99 mg/dL 78  83  77   BUN 8 - 23 mg/dL 8  10  9    Creatinine 0.44 - 1.00 mg/dL 1.61  0.96  0.45   Sodium 135 - 145 mmol/L 132  131  129   Potassium 3.5 - 5.1 mmol/L 3.6  3.8  3.9   Chloride 98 - 111 mmol/L 97  97  96   CO2 22 - 32 mmol/L 23  24  22    Calcium 8.9 - 10.3 mg/dL 8.0  8.3  8.3       Microbiology: Stool - cdiff antigen and PCR psitive-  toxin negative    Assessment/Plan: Cdiff diarrhea- antigen positive, toxin negative and PCR positive Persistent cdiff Received 10 days of vanco Completed 10 days of fidoxamicin She continues to have diarrhea CT scan done on 1/21 showed thickening of the descending and sigmoid colon She is on appropriate treatment but with persistent diarrhea- She had colonoscopy today and there is no pseudomembrane to indicate cdiff colitis Biopsies taken   Weight loss of 40 pounds? cause  Ca breast s.p rt mastectomy- triple positive was on letrozole fr 7 years and completed    Anemia   Severe hypothyroidism on synthroid  Discussed the management with the patient and Hospitalist ID will sign off She will follow up with GI as OP

## 2023-08-13 NOTE — Op Note (Signed)
[facility] Gastroenterology Patient Name: Abigail Ortega Procedure Date: 08/13/2023 2:00 PM MRN: [id number] Account #: [id number] Date of Birth: 03-22-1951 Admit Type: Outpatient Age: 73 Room: [facility] ENDO ROOM 3 Gender: Female Note Status: Finalized Instrument Name: Upper Endoscope [id number] Procedure:             Upper GI endoscopy Indications:           Diarrhea, Weight loss Providers:             Midge Minium MD, MD Referring MD:          Margaretann Loveless, MD (Referring MD) Medicines:             Propofol per Anesthesia Complications:         No immediate complications. Procedure:             Pre-Anesthesia Assessment:                        - Prior to the procedure, a History and Physical was                         performed, and patient medications and allergies were                         reviewed. The patient's tolerance of previous                         anesthesia was also reviewed. The risks and benefits                         of the procedure and the sedation options and risks                         were discussed with the patient. All questions were                         answered, and informed consent was obtained. Prior                         Anticoagulants: The patient has taken no anticoagulant                         or antiplatelet agents. ASA Grade Assessment: II - A                         patient with mild systemic disease. After reviewing                         the risks and benefits, the patient was deemed in                         satisfactory condition to undergo the procedure.                        After obtaining informed consent, the endoscope was                         passed under direct vision. Throughout the procedure,  the patient's blood pressure, pulse, and oxygen                         saturations were monitored continuously. The Endoscope                         was introduced through the mouth,  and advanced to the                         second part of duodenum. The upper GI endoscopy was                         accomplished without difficulty. The patient tolerated                         the procedure well. Findings:      A medium-sized hiatal hernia was present.      LA Grade C (one or more mucosal breaks continuous between tops of 2 or       more mucosal folds, less than 75% circumference) esophagitis with no       bleeding was found in the lower third of the esophagus.      The stomach was normal.      The examined duodenum was normal. Biopsies were taken with a cold       forceps for histology. Impression:            - Medium-sized hiatal hernia.                        - LA Grade C esophagitis with no bleeding.                        - Normal stomach.                        - Normal examined duodenum. Biopsied. Recommendation:        - Return patient to hospital ward for ongoing care.                        - Resume previous diet.                        - Continue present medications.                        - Await pathology results.                        - Perform a colonoscopy today. Procedure Code(s):     --- Professional ---                        [phone], Esophagogastroduodenoscopy, flexible,                         transoral; with biopsy, single or multiple Diagnosis Code(s):     --- Professional ---                        R19.7, Diarrhea, unspecified  R63.4, Abnormal weight loss                        K20.90, Esophagitis, unspecified without bleeding CPT copyright 2022 American Medical Association. All rights reserved. The codes documented in this report are preliminary and upon coder review may  be revised to meet current compliance requirements. Midge Minium MD, MD 08/13/2023 2:18:17 PM This report has been signed electronically. Number of Addenda: 0 Note Initiated On: 08/13/2023 2:00 PM Estimated Blood Loss:  Estimated blood loss:  none.      [facility]

## 2023-08-13 NOTE — Anesthesia Preprocedure Evaluation (Addendum)
Anesthesia Evaluation  Patient identified by MRN, date of birth, ID band Patient awake    Reviewed: Allergy & Precautions, NPO status , Patient's Chart, lab work & pertinent test results  History of Anesthesia Complications Negative for: history of anesthetic complications  Airway Mallampati: III  TM Distance: >3 FB Neck ROM: Full    Dental  (+) Edentulous Upper, Edentulous Lower   Pulmonary neg sleep apnea, neg COPD, former smoker   breath sounds clear to auscultation- rhonchi (-) wheezing      Cardiovascular hypertension, Pt. on medications + CAD, + Past MI, + Cardiac Stents and +CHF   Rhythm:Regular Rate:Normal - Systolic murmurs and - Diastolic murmurs Echo 09/05/16: MILD LV SYSTOLIC DYSFUNCTION WITH MILD LVH NORMAL RIGHT VENTRICULAR SYSTOLIC FUNCTION MILD VALVULAR REGURGITATION (mild MR, trivial AI) NO VALVULAR STENOSIS EF 40%   Neuro/Psych  Headaches PSYCHIATRIC DISORDERS Anxiety Depression       GI/Hepatic Neg liver ROS,GERD  ,,  Endo/Other  neg diabetesHypothyroidism    Renal/GU      Musculoskeletal   Abdominal   Peds  Hematology negative hematology ROS (+)   Anesthesia Other Findings Past Medical History: No date: Anxiety No date: Arthritis 2014: Breast cancer (HCC)     Comment:  Right breast cancer - chemo, radiation and Mastectomy 2013: CHF (congestive heart failure) (HCC) No date: Chronic kidney disease No date: Clotting disorder (HCC)     Comment:  blood clots No date: Depression No date: GERD (gastroesophageal reflux disease) No date: GI bleed No date: H/O heart artery stent No date: Headache 2012: Heart attack [facility])     Comment:  coronary stent x2 completed by Dr. Juliann Pares. Apr 29, 2013: Heart attack (HCC) No date: Hemorrhoids No date: Hypertension No date: Hypothyroidism No date: IBS (irritable bowel syndrome) January 20, 2013.: Malignant neoplasm of lower-inner quadrant of female   breast (HCC)     Comment:  invasive mammary cancer, minimal 0.85 cm.  Histologic               grade 1. 2014: Personal history of chemotherapy     Comment:  BREAST CA 2014: Personal history of radiation therapy     Comment:  BREAST CA No date: Thyroid disease   Reproductive/Obstetrics                             Anesthesia Physical Anesthesia Plan  ASA: 3  Anesthesia Plan: General   Post-op Pain Management:    Induction: Intravenous  PONV Risk Score and Plan:   Airway Management Planned: Nasal Cannula  Additional Equipment: None  Intra-op Plan:   Post-operative Plan:   Informed Consent:   Plan Discussed with:   Anesthesia Plan Comments: (Discussed risks of anesthesia with patient, including possibility of difficulty with spontaneous ventilation under anesthesia necessitating airway intervention, PONV, and rare risks such as cardiac or respiratory or neurological events, and allergic reactions. Discussed the role of CRNA in patient's perioperative care. Patient understands.)       Anesthesia Quick Evaluation

## 2023-08-13 NOTE — Progress Notes (Signed)
PROGRESS NOTE    Abigail Ortega  WGN:[id number] DOB: October 14, 1950 DOA: 08/02/2023 PCP: Margaretann Loveless, MD    Brief Narrative:  Abigail Ortega is a 73 y.o. female with medical history significant of recent C. difficile colitis s/p treatment with vancomycin (December 2024), CAD s/p DES, breast cancer s/p chemoradiation and mastectomy, hypothyroidism, hypertension, hyperlipidemia, osteoporosis, who presents to the ED due to diarrhea.   [name] states that since she was discharged from SNF approximately 10 days ago, she has been experiencing persistent diarrhea with some generalized abdominal pain.  She denies any nausea or vomiting but notes poor p.o. intake.  She has been experiencing fecal incontinence.  Denies melena or hematochezia, fever/chills.She does endorse shortness of breath with exertion only, but denies any chest pain, palpitations.  She notes that shortness of breath with exertion began after her recent hospitalization  Patient has had significant diarrhea after 10 days of vancomycin.  Infectious disease consulted 1/21.  GI consulted as well with plans for deferring endoscopic evaluation outpatient or until resolution of colitis.  CT abdomen pelvis on 1/21 demonstrates mild to moderate colitis without evidence of abscess or perforation.  1/26: Persistence of diarrhea despite completion of the daptomycin and oral vancomycin.  No blood in stool.  Reengage with ID and GI today.  Plan for EGD and flexible sigmoidoscopy 1/27.  1/27: Plan for EGD and flex sig today.  Diarrhea appears to be improving.   Assessment & Plan:   Principal Problem:   C. difficile colitis Active Problems:   Hypokalemia   Chronic combined systolic and diastolic CHF (congestive heart failure) (HCC)   CAD (coronary artery disease)   Chronic hypotension   Severe hypothyroidism   Folate deficiency   Normocytic anemia   Hypomagnesemia   Diarrhea of presumed infectious origin  C. difficile  colitis  intractable diarrhea 10 days diarrhea coming in.  C. difficile PCR positive.  Interestingly toxin was negative.  Patient completed 10 days of oral vancomycin and still had diarrhea.  Started on Dificid.  Infectious disease consulted 1/21.  GI consulted as well with recommendation to defer endoscopic evaluation at this time.  CT abdomen pelvis with mild to moderate colitis without evidence of abscess or perforation.  Patient hemodynamically stable.  Belly soft on examination.  Flexi-Seal removed 1/22 Plan: Completed course of Dificid.  Questran discontinued.  Magnesium discontinued.  Frequency of loose stool seems to be improving.  GI reengaged.  Plan for EGD and flex sig today.  Pending celiac, fecal calprotectin, CRP.    Hypokalemia QT prolongation Likely in the setting of intractable diarrhea Monitor potassium and replace as needed  Hypothyroidism Stable Repeat TSH and free T4 in 6 weeks as outpatient  Chronic hypotension Stable Continue midodrine  Coronary disease Stable. Continue home regimen  Chronic systolic and diastolic congestive heart failure  Appears hypovolemic secondary to intractable diarrhea Continue holding Lasix  Normocytic anemia Suspect anemia of inflammation.   No evidence for acute iron deficiency.  DVT prophylaxis: SQ Lovenox Code Status: DNR Family Communication: None today Disposition Plan: Status is: Inpatient Remains inpatient appropriate because: Intractable diarrhea   Level of care: Med-Surg  Consultants:  ID  Procedures:  None  Antimicrobials:   Subjective: Seen and examined.  Resting in bed.  No complaints of pain.  Objective: Vitals:   08/12/23 1527 08/12/23 1953 08/13/23 0150 08/13/23 0420  BP: 92/67 121/81 103/62   Pulse: 81 87 82   Resp: 17 16 18    Temp: 98.4 F (  36.9 C) 99.2 F (37.3 C) 98.7 F (37.1 C)   TempSrc: Oral Oral Oral   SpO2: 99% 97% 99%   Weight:    62.1 kg  Height:        Intake/Output Summary  (Last 24 hours) at 08/13/2023 1212 Last data filed at 08/13/2023 0102 Gross per 24 hour  Intake 260 ml  Output --  Net 260 ml    Filed Weights   08/09/23 0700 08/12/23 0500 08/13/23 0420  Weight: 63.8 kg 64.9 kg 62.1 kg    Examination:  General exam: NAD Respiratory system: Lung clear.  Normal work of breathing.  Room air Cardiovascular system: S1-S2, RRR, no murmurs, no pedal edema Gastrointestinal system: Thin, soft, NT/ND, normal bowel sounds Central nervous system: Alert and oriented. No focal neurological deficits. Extremities: Deconditioned.  Symmetrically decreased power Skin: No rashes, lesions or ulcers Psychiatry: Judgement and insight appear normal. Mood & affect appropriate.     Data Reviewed: I have personally reviewed following labs and imaging studies  CBC: Recent Labs  Lab 08/08/23 0838 08/12/23 0926  WBC 8.3 7.4  NEUTROABS 4.7 4.7  HGB 9.0* 9.4*  HCT 25.7* 27.6*  MCV 91.8 93.2  PLT 502* 408*   Basic Metabolic Panel: Recent Labs  Lab 08/07/23 0437 08/08/23 0838 08/09/23 0454 08/10/23 0513 08/11/23 0411 08/12/23 0412 08/13/23 0438  NA 131* 128*  --  132* 129* 131* 132*  K 3.4* 3.6  --  3.4* 3.9 3.8 3.6  CL 100 98  --  99 96* 97* 97*  CO2 22 20*  --  23 22 24 23   GLUCOSE 78 84  --  85 77 83 78  BUN 6* 6*  --  8 9 10 8   CREATININE 1.02* 1.08*  --  1.08* 1.10* 1.03* 1.01*  CALCIUM 8.2* 8.5*  --  8.4* 8.3* 8.3* 8.0*  MG 1.7  --  1.9  --  2.2 2.4 2.1  PHOS  --   --   --   --  2.5 2.3* 2.2*   GFR: Estimated Creatinine Clearance: 44.6 mL/min (A) (by C-G formula based on SCr of 1.01 mg/dL (H)). Liver Function Tests: No results for input(s): "AST", "ALT", "ALKPHOS", "BILITOT", "PROT", "ALBUMIN" in the last 168 hours.  No results for input(s): "LIPASE", "AMYLASE" in the last 168 hours. No results for input(s): "AMMONIA" in the last 168 hours. Coagulation Profile: No results for input(s): "INR", "PROTIME" in the last 168 hours.  Cardiac  Enzymes: No results for input(s): "CKTOTAL", "CKMB", "CKMBINDEX", "TROPONINI" in the last 168 hours. BNP (last 3 results) No results for input(s): "PROBNP" in the last 8760 hours. HbA1C: No results for input(s): "HGBA1C" in the last 72 hours. CBG: No results for input(s): "GLUCAP" in the last 168 hours. Lipid Profile: No results for input(s): "CHOL", "HDL", "LDLCALC", "TRIG", "CHOLHDL", "LDLDIRECT" in the last 72 hours. Thyroid Function Tests: No results for input(s): "TSH", "T4TOTAL", "FREET4", "T3FREE", "THYROIDAB" in the last 72 hours. Anemia Panel: No results for input(s): "VITAMINB12", "FOLATE", "FERRITIN", "TIBC", "IRON", "RETICCTPCT" in the last 72 hours. Sepsis Labs: No results for input(s): "PROCALCITON", "LATICACIDVEN" in the last 168 hours.   No results found for this or any previous visit (from the past 240 hours).        Radiology Studies: No results found.       Scheduled Meds:  aspirin EC  81 mg Oral Daily   atorvastatin  80 mg Oral Daily   clopidogrel  75 mg Oral Daily  dicyclomine  10 mg Oral TID AC   enoxaparin (LOVENOX) injection  40 mg Subcutaneous Q24H   ferrous sulfate  325 mg Oral Daily   folic acid  1 mg Oral Daily   Gerhardt's butt cream   Topical QID   influenza vaccine adjuvanted  0.5 mL Intramuscular Tomorrow-1000   isosorbide mononitrate  60 mg Oral BID   levothyroxine  88 mcg Oral Q0600   lidocaine  2 patch Transdermal Q24H   midodrine  10 mg Oral TID WC   montelukast  10 mg Oral QHS   potassium & sodium phosphates  2 packet Oral Q4H   ranolazine  500 mg Oral BID   Continuous Infusions:  sodium chloride 20 mL/hr at 08/13/23 0102     LOS: 10 days     Tresa Moore, MD [facility]   If 7PM-7AM, please contact night-coverage  08/13/2023, 12:12 PM

## 2023-08-13 NOTE — TOC Progression Note (Signed)
Transition of Care [facility]) - Progression Note    Patient Details  Name: Abigail Ortega MRN: [id number] Date of Birth: 24-Feb-1951  [facility] CM/SW Contact  Chapman Fitch, RN Phone Number: 08/13/2023, 12:25 PM  Clinical Narrative:     Patient called insurance to complete peer to peer Per MD "denial was placed due to the need for more inpatient workup. Once the patient has completed all of her workup then will need to be re-submitted " Awaiting final determination from insurance  Expected Discharge Plan: Skilled Nursing Facility Barriers to Discharge: Continued Medical Work up  Expected Discharge Plan and Services       Living arrangements for the past 2 months: Single Family Home                                       Social Determinants of Health (SDOH) Interventions SDOH Screenings   Food Insecurity: No Food Insecurity (08/03/2023)  Housing: Low Risk  (08/03/2023)  Transportation Needs: No Transportation Needs (08/03/2023)  Utilities: Not At Risk (08/03/2023)  Social Connections: Moderately Integrated (08/03/2023)  Tobacco Use: Medium Risk (08/02/2023)    Readmission Risk Interventions     No data to display

## 2023-08-13 NOTE — Transfer of Care (Signed)
Immediate Anesthesia Transfer of Care Note  Patient: Abigail Ortega  Procedure(s) Performed: ESOPHAGOGASTRODUODENOSCOPY (EGD) WITH PROPOFOL FLEXIBLE SIGMOIDOSCOPY BIOPSY  Patient Location: PACU  Anesthesia Type:General  Level of Consciousness: sedated  Airway & Oxygen Therapy: Patient Spontanous Breathing and Patient connected to nasal cannula oxygen  Post-op Assessment: Report given to RN and Post -op Vital signs reviewed and stable  Post vital signs: Reviewed and stable  Last Vitals:  Vitals Value Taken Time  BP    Temp    Pulse    Resp    SpO2      Last Pain:  Vitals:   08/13/23 0825  TempSrc:   PainSc: 0-No pain         Complications: No notable events documented.

## 2023-08-13 NOTE — Progress Notes (Signed)
PT Cancellation Note  Patient Details Name: Abigail Ortega MRN: [id number] DOB: 12-May-1951   Cancelled Treatment:    Reason Eval/Treat Not Completed: Patient at procedure or test/unavailable. Chart Reviewed. Therapist completed attempt this date/time, patient off the floor at procedure. Will re-attempt at later date/time.    Howie Ill, PT, DPT 08/13/23 2:54 PM

## 2023-08-13 NOTE — Anesthesia Postprocedure Evaluation (Signed)
Anesthesia Post Note  Patient: ARITZEL KRUSEMARK  Procedure(s) Performed: ESOPHAGOGASTRODUODENOSCOPY (EGD) WITH PROPOFOL FLEXIBLE SIGMOIDOSCOPY BIOPSY  Patient location during evaluation: Endoscopy Anesthesia Type: General Level of consciousness: awake and alert Pain management: pain level controlled Vital Signs Assessment: post-procedure vital signs reviewed and stable Respiratory status: spontaneous breathing, nonlabored ventilation, respiratory function stable and patient connected to nasal cannula oxygen Cardiovascular status: blood pressure returned to baseline and stable Postop Assessment: no apparent nausea or vomiting Anesthetic complications: no  No notable events documented.   Last Vitals:  Vitals:   08/13/23 1444 08/13/23 1451  BP: (!) 87/55 (!) 95/58  Pulse: 85 84  Resp: 18 17  Temp:    SpO2: 97% 96%    Last Pain:  Vitals:   08/13/23 1451  TempSrc:   PainSc: 0-No pain                 Stephanie Coup

## 2023-08-13 NOTE — Op Note (Signed)
[facility] Gastroenterology Patient Name: Abigail Ortega Procedure Date: 08/13/2023 2:00 PM MRN: [id number] Account #: [id number] Date of Birth: Oct 19, 1950 Admit Type: Outpatient Age: 73 Room: [facility] ENDO ROOM 3 Gender: Female Note Status: Finalized Instrument Name: Upper Endoscope [id number] Procedure:             Colonoscopy Indications:           Chronic diarrhea Providers:             Midge Minium MD, MD Referring MD:          Margaretann Loveless, MD (Referring MD) Medicines:             Propofol per Anesthesia Complications:         No immediate complications. Procedure:             Pre-Anesthesia Assessment:                        - Prior to the procedure, a History and Physical was                         performed, and patient medications and allergies were                         reviewed. The patient's tolerance of previous                         anesthesia was also reviewed. The risks and benefits                         of the procedure and the sedation options and risks                         were discussed with the patient. All questions were                         answered, and informed consent was obtained. Prior                         Anticoagulants: The patient has taken no anticoagulant                         or antiplatelet agents. ASA Grade Assessment: II - A                         patient with mild systemic disease. After reviewing                         the risks and benefits, the patient was deemed in                         satisfactory condition to undergo the procedure.                        After obtaining informed consent, the colonoscope was                         passed under direct vision. Throughout the procedure,  the patient's blood pressure, pulse, and oxygen                         saturations were monitored continuously. The Endoscope                         was introduced through the anus and advanced  to the                         the cecum, identified by appendiceal orifice and                         ileocecal valve. After obtaining informed consent, the                         colonoscope was passed under direct vision. Throughout                         the procedure, the patient's blood pressure, pulse,                         and oxygen saturations were monitored continuously.The                         colonoscopy was performed without difficulty. The                         patient tolerated the procedure well. The quality of                         the bowel preparation was adequate to identify polyps. Findings:      The perianal and digital rectal examinations were normal.      A few small-mouthed diverticula were found in the entire colon.      Random biopsies were obtained with cold forceps for histology randomly       in the entire colon. Impression:            - Diverticulosis in the entire examined colon.                        - Random biopsies were obtained in the entire colon. Recommendation:        - Discharge patient to home.                        - Resume previous diet.                        - Continue present medications.                        - Await pathology results. Procedure Code(s):     --- Professional ---                        [phone], Colonoscopy, flexible; with biopsy, single or                         multiple Diagnosis Code(s):     --- Professional ---  K52.9, Noninfective gastroenteritis and colitis,                         unspecified CPT copyright 2022 American Medical Association. All rights reserved. The codes documented in this report are preliminary and upon coder review may  be revised to meet current compliance requirements. Midge Minium MD, MD 08/13/2023 2:32:41 PM This report has been signed electronically. Number of Addenda: 0 Note Initiated On: 08/13/2023 2:00 PM Scope Withdrawal Time: 0 hours 2 minutes 57  seconds  Total Procedure Duration: 0 hours 9 minutes 6 seconds  Estimated Blood Loss:  Estimated blood loss: none.      [facility]

## 2023-08-13 NOTE — Progress Notes (Signed)
PHARMACY CONSULT NOTE - FOLLOW UP  Pharmacy Consult for Electrolyte Monitoring and Replacement   Recent Labs: Potassium (mmol/L)  Date Value  08/13/2023 3.6  03/20/2014 3.4 (L)   Magnesium (mg/dL)  Date Value  16/04/9603 2.1  02/23/2014 1.7 (L)   Calcium (mg/dL)  Date Value  54/03/8118 8.0 (L)   Calcium, Total (mg/dL)  Date Value  14/78/2956 9.6   Albumin (g/dL)  Date Value  21/30/8657 2.1 (L)  11/24/2022 4.3  03/20/2014 3.4   Phosphorus (mg/dL)  Date Value  84/69/6295 2.2 (L)   Sodium (mmol/L)  Date Value  08/13/2023 132 (L)  11/24/2022 137  03/20/2014 135 (L)   Corrected Ca: 9.0     Diet: reg Fluids: none Pertinent Medications: fidaxomicin (Cdiff diarrhea),   Assessment: PM is a 73 yo female who presented for follow-up due to hx of C. Difficile. They have had severe diarrhea, weight-loss and weakness. They arrived hypotensive and confused today. Pharmacy has been consulted to manage this patient's electrolytes.   Goal of Therapy:  Electrolytes WNL   Plan:  Phos 2.2  Phos-Nak 2 packets q4h x 3 doses ordered Will recheck BMP with AM labs given continued diarrhea   Markala Sitts A, PharmD 08/13/2023 12:06 PM

## 2023-08-14 ENCOUNTER — Encounter: Payer: Self-pay | Admitting: Gastroenterology

## 2023-08-14 ENCOUNTER — Other Ambulatory Visit: Payer: Self-pay

## 2023-08-14 DIAGNOSIS — A0472 Enterocolitis due to Clostridium difficile, not specified as recurrent: Secondary | ICD-10-CM | POA: Diagnosis not present

## 2023-08-14 LAB — CBC
HCT: 21.4 % — ABNORMAL LOW (ref 36.0–46.0)
Hemoglobin: 7.2 g/dL — ABNORMAL LOW (ref 12.0–15.0)
MCH: 31.6 pg (ref 26.0–34.0)
MCHC: 33.6 g/dL (ref 30.0–36.0)
MCV: 93.9 fL (ref 80.0–100.0)
Platelets: 319 10*3/uL (ref 150–400)
RBC: 2.28 MIL/uL — ABNORMAL LOW (ref 3.87–5.11)
RDW: 15.7 % — ABNORMAL HIGH (ref 11.5–15.5)
WBC: 6.2 10*3/uL (ref 4.0–10.5)
nRBC: 0 % (ref 0.0–0.2)

## 2023-08-14 LAB — BASIC METABOLIC PANEL
Anion gap: 8 (ref 5–15)
BUN: 10 mg/dL (ref 8–23)
CO2: 23 mmol/L (ref 22–32)
Calcium: 7.9 mg/dL — ABNORMAL LOW (ref 8.9–10.3)
Chloride: 100 mmol/L (ref 98–111)
Creatinine, Ser: 0.94 mg/dL (ref 0.44–1.00)
GFR, Estimated: >60 mL/min (ref >60)
Glucose, Bld: 89 mg/dL (ref 70–99)
Potassium: 3.6 mmol/L (ref 3.5–5.1)
Sodium: 131 mmol/L — ABNORMAL LOW (ref 135–145)

## 2023-08-14 LAB — PHOSPHORUS: Phosphorus: 2 mg/dL — ABNORMAL LOW (ref 2.5–4.6)

## 2023-08-14 LAB — SURGICAL PATHOLOGY

## 2023-08-14 LAB — MAGNESIUM: Magnesium: 1.9 mg/dL (ref 1.7–2.4)

## 2023-08-14 MED ORDER — LOPERAMIDE HCL 2 MG PO CAPS
2.0000 mg | ORAL_CAPSULE | ORAL | Status: DC | PRN
  Administered 2023-08-17: 2 mg via ORAL
  Filled 2023-08-14: qty 1

## 2023-08-14 MED ORDER — POTASSIUM & SODIUM PHOSPHATES 280-160-250 MG PO PACK
1.0000 | PACK | Freq: Three times a day (TID) | ORAL | Status: AC
  Administered 2023-08-14 (×3): 1 via ORAL
  Filled 2023-08-14 (×3): qty 1

## 2023-08-14 MED ORDER — PSYLLIUM 95 % PO PACK
1.0000 | PACK | Freq: Every day | ORAL | Status: DC
  Administered 2023-08-14 – 2023-08-17 (×4): 1 via ORAL
  Filled 2023-08-14 (×4): qty 1

## 2023-08-14 NOTE — TOC Progression Note (Addendum)
Transition of Care [facility]) - Progression Note    Patient Details  Name: Abigail Ortega MRN: [id number] Date of Birth: September 01, 1950  [facility] of [facility]) CM/SW Contact  Chapman Fitch, RN Phone Number: 08/14/2023, 11:34 AM  Clinical Narrative:     Message sent to MD to determine if patient is medically appropriate for auth to be reinitiated for [facility]  Update:  Per MD not medically appropraite to start auth today.  Will Check in with MD tomorrow   Expected Discharge Plan: Skilled Nursing Facility Barriers to Discharge: Continued Medical Work up  Expected Discharge Plan and Services       Living arrangements for the past 2 months: Single Family Home                                       Social Determinants of Health (SDOH) Interventions SDOH Screenings   Food Insecurity: No Food Insecurity (08/03/2023)  Housing: Low Risk  (08/03/2023)  Transportation Needs: No Transportation Needs (08/03/2023)  Utilities: Not At Risk (08/03/2023)  Social Connections: Moderately Integrated (08/03/2023)  Tobacco Use: Medium Risk (08/02/2023)    Readmission Risk Interventions     No data to display

## 2023-08-14 NOTE — Progress Notes (Signed)
Abigail Minium, MD [facility]   [address], [address] [place], [place] [address] Phone: [phone] Fax : [phone]   Subjective: The patient had a colonoscopy yesterday with biopsies taken of the small bowel and the colon.  The biopsies were taken out only of the sigmoid area that was abnormal on the CT scan but also of the right and left colon.  The patient's biopsies came back today and were normal.  The patient has a history of C. difficile but her most recent stool showed PCR negative.   Objective: Vital signs in last 24 hours: Vitals:   08/13/23 2054 08/14/23 0353 08/14/23 0511 08/14/23 0757  BP: 106/64 (!) 109/55 99/64 101/68  Pulse: 85 93 92 93  Resp: 16 16 20 18   Temp: 97.8 F (36.6 C) 99.3 F (37.4 C) 98.7 F (37.1 C) 98.9 F (37.2 C)  TempSrc: Oral Oral Oral Oral  SpO2: 100% 100% 98% 100%  Weight:  62.1 kg    Height:       Weight change: 0 kg  Intake/Output Summary (Last 24 hours) at 08/14/2023 1328 Last data filed at 08/14/2023 0902 Gross per 24 hour  Intake 360 ml  Output 700 ml  Net -340 ml     Exam: General: Patient lying in bed in no apparent distress   Lab Results: @LABTEST2 @ Micro Results: No results found for this or any previous visit (from the past 240 hours). Studies/Results: No results found. Medications: I have reviewed the patient's current medications. Scheduled Meds:  aspirin EC  81 mg Oral Daily   atorvastatin  80 mg Oral Daily   clopidogrel  75 mg Oral Daily   dicyclomine  10 mg Oral TID AC   enoxaparin (LOVENOX) injection  40 mg Subcutaneous Q24H   ferrous sulfate  325 mg Oral Daily   folic acid  1 mg Oral Daily   Gerhardt's butt cream   Topical QID   influenza vaccine adjuvanted  0.5 mL Intramuscular Tomorrow-1000   isosorbide mononitrate  60 mg Oral BID   levothyroxine  88 mcg Oral Q0600   lidocaine  2 patch Transdermal Q24H   midodrine  10 mg Oral TID WC   montelukast  10 mg Oral QHS   potassium & sodium phosphates  1  packet Oral TID WC & HS   psyllium  1 packet Oral Daily   ranolazine  500 mg Oral BID   Continuous Infusions:  sodium chloride 20 mL/hr at 08/13/23 2228   PRN Meds:.acetaminophen **OR** acetaminophen, ondansetron (ZOFRAN) IV, ondansetron **OR** [DISCONTINUED] ondansetron (ZOFRAN) IV, oxyCODONE, traZODone   Assessment: Principal Problem:   C. 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  The patient has been explained this and should be treated symptomatically for her diarrhea with antidiarrheal medication.  It appears that the contents of both Imodium and Lomotil show that she is allergic to these medications although she denies knowing anything about being allergic to these medications.  I will leave the delineation of her allergies to the hospitalist team to further evaluate whether this  is an allergy or not and if she can be started on antidiarrheal medication, she would be beneficial for the patient.  The patient should follow-up as an outpatient with GI.  Her GI doctor is Dr. Tobi Bastos.   LOS: 11 days   Abigail Minium, MD.FACG 08/14/2023, 1:28 PM Pager [phone] 7am-5pm  Check AMION for 5pm -7am coverage and on weekends

## 2023-08-14 NOTE — Progress Notes (Signed)
PHARMACY CONSULT NOTE - FOLLOW UP  Pharmacy Consult for Electrolyte Monitoring and Replacement   Recent Labs: Potassium (mmol/L)  Date Value  08/14/2023 3.6  03/20/2014 3.4 (L)   Magnesium (mg/dL)  Date Value  16/04/9603 1.9  02/23/2014 1.7 (L)   Calcium (mg/dL)  Date Value  54/03/8118 7.9 (L)   Calcium, Total (mg/dL)  Date Value  14/78/2956 9.6   Albumin (g/dL)  Date Value  21/30/8657 2.1 (L)  11/24/2022 4.3  03/20/2014 3.4   Phosphorus (mg/dL)  Date Value  84/69/6295 2.0 (L)   Sodium (mmol/L)  Date Value  08/14/2023 131 (L)  11/24/2022 137  03/20/2014 135 (L)   Corrected Ca: 9.0     Diet: reg Fluids: none Pertinent Medications: fidaxomicin (Cdiff diarrhea),   Assessment: PM is a 73 yo female who presented for follow-up due to hx of C. Difficile. They have had severe diarrhea, weight-loss and weakness. They arrived hypotensive and confused today. Pharmacy has been consulted to manage this patient's electrolytes.   Goal of Therapy:  Electrolytes WNL   Plan:  Phos 2.0  Will order Phos-Nak 2 packets TID x 3 doses  (Note pt only received 1 of the 3 doses ordered 1/27) Will recheck BMP with AM labs given continued diarrhea   Mavryk Pino A, PharmD 08/14/2023 10:45 AM

## 2023-08-14 NOTE — Progress Notes (Signed)
PROGRESS NOTE    Abigail Ortega  ZDG:[id number] DOB: [birth year] DOA: 08/02/2023 PCP: Margaretann Loveless, MD    Brief Narrative:  Abigail Ortega is a 73 y.o. female with medical history significant of recent C. difficile colitis s/p treatment with vancomycin (December 2024), CAD s/p DES, breast cancer s/p chemoradiation and mastectomy, hypothyroidism, hypertension, hyperlipidemia, osteoporosis, who presents to the ED due to diarrhea.   Abigail Ortega states that since she was discharged from SNF approximately 10 days ago, she has been experiencing persistent diarrhea with some generalized abdominal pain.  She denies any nausea or vomiting but notes poor p.o. intake.  She has been experiencing fecal incontinence.  Denies melena or hematochezia, fever/chills.She does endorse shortness of breath with exertion only, but denies any chest pain, palpitations.  She notes that shortness of breath with exertion began after her recent hospitalization  Patient has had significant diarrhea after 10 days of vancomycin.  Infectious disease consulted 1/21.  GI consulted as well with plans for deferring endoscopic evaluation outpatient or until resolution of colitis.  CT abdomen pelvis on 1/21 demonstrates mild to moderate colitis without evidence of abscess or perforation.  1/26: Persistence of diarrhea despite completion of the daptomycin and oral vancomycin.  No blood in stool.  Reengage with ID and GI today.  Plan for EGD and flexible sigmoidoscopy 1/27.  1/27: Plan for EGD and flex sig today.  Diarrhea appears to be improving. 1/28: Status post EGD and colonoscopy.  Biopsies reviewed by GI.  Negative for infection.  GI recommends initiation of antidiarrheals.   Assessment & Plan:   Principal Problem:   C. difficile colitis Active Problems:   Hypokalemia   Chronic combined systolic and diastolic CHF (congestive heart failure) (HCC)   CAD (coronary artery disease)   Chronic hypotension   Severe  hypothyroidism   Folate deficiency   Normocytic anemia   Hypomagnesemia   Diarrhea of presumed infectious origin   Loss of weight   Diarrhea  C. difficile colitis  intractable diarrhea 10 days diarrhea coming in.  C. difficile PCR positive.  Interestingly toxin was negative.  Patient completed 10 days of oral vancomycin and still had diarrhea.  Started on Dificid.  Infectious disease consulted 1/21.  GI consulted as well with recommendation to defer endoscopic evaluation at this time.  CT abdomen pelvis with mild to moderate colitis without evidence of abscess or perforation.  Patient hemodynamically stable.  Belly soft on examination.  Flexi-Seal removed 1/22.  EGD and flex sig 1/27.  Biopsies reviewed by GI.  Overall unrevealing. Plan: Infectious diarrhea essentially ruled out at this point.  Need something for stool bulking and antidiarrheal effect.  Start daily psyllium.  Start as needed Imodium.  If stools become formed patient will be medically ready for discharge.  Anticipate medical readiness in 24 to 48 hours.  Outpatient follow-up with GI postdischarge.  Primary GI is Dr. Tobi Bastos.  Hypokalemia QT prolongation Likely in the setting of intractable diarrhea Monitor potassium and replace as needed  Hypothyroidism Stable Repeat TSH and free T4 in 6 weeks as outpatient  Chronic hypotension Stable Continue midodrine  Coronary disease Stable. Continue home regimen  Chronic systolic and diastolic congestive heart failure  Appears hypovolemic secondary to intractable diarrhea Continue holding Lasix  Normocytic anemia Suspect anemia of inflammation.   No evidence for acute iron deficiency.  DVT prophylaxis: SQ Lovenox Code Status: DNR Family Communication: Sister at bedside 1/28 Disposition Plan: Status is: Inpatient Remains inpatient appropriate because: Intractable diarrhea  Level of care: Med-Surg  Consultants:  ID  Procedures:   None  Antimicrobials:   Subjective: Seen and examined.  In good spirits.  No complaints  Objective: Vitals:   08/13/23 2054 08/14/23 0353 08/14/23 0511 08/14/23 0757  BP: 106/64 (!) 109/55 99/64 101/68  Pulse: 85 93 92 93  Resp: 16 16 20 18   Temp: 97.8 F (36.6 C) 99.3 F (37.4 C) 98.7 F (37.1 C) 98.9 F (37.2 C)  TempSrc: Oral Oral Oral Oral  SpO2: 100% 100% 98% 100%  Weight:  62.1 kg    Height:        Intake/Output Summary (Last 24 hours) at 08/14/2023 1505 Last data filed at 08/14/2023 0902 Gross per 24 hour  Intake 160 ml  Output 700 ml  Net -540 ml    Filed Weights   08/12/23 0500 08/13/23 0420 08/14/23 0353  Weight: 64.9 kg 62.1 kg 62.1 kg    Examination:  General exam: NAD.  Appears frail Respiratory system: Lung clear.  Normal work of breathing.  Room air Cardiovascular system: S1-S2, RRR, no murmurs, no pedal edema Gastrointestinal system: Thin, soft, NT/ND, normal bowel sounds Central nervous system: Alert and oriented. No focal neurological deficits. Extremities: Deconditioned.  Symmetrically decreased power Skin: No rashes, lesions or ulcers Psychiatry: Judgement and insight appear normal. Mood & affect appropriate.     Data Reviewed: I have personally reviewed following labs and imaging studies  CBC: Recent Labs  Lab 08/08/23 0838 08/12/23 0926 08/14/23 0801  WBC 8.3 7.4 6.2  NEUTROABS 4.7 4.7  --   HGB 9.0* 9.4* 7.2*  HCT 25.7* 27.6* 21.4*  MCV 91.8 93.2 93.9  PLT 502* 408* 319   Basic Metabolic Panel: Recent Labs  Lab 08/09/23 0454 08/10/23 0513 08/11/23 0411 08/12/23 0412 08/13/23 0438 08/14/23 0801  NA  --  132* 129* 131* 132* 131*  K  --  3.4* 3.9 3.8 3.6 3.6  CL  --  99 96* 97* 97* 100  CO2  --  23 22 24 23 23   GLUCOSE  --  85 77 83 78 89  BUN  --  8 9 10 8 10   CREATININE  --  1.08* 1.10* 1.03* 1.01* 0.94  CALCIUM  --  8.4* 8.3* 8.3* 8.0* 7.9*  MG 1.9  --  2.2 2.4 2.1 1.9  PHOS  --   --  2.5 2.3* 2.2* 2.0*    GFR: Estimated Creatinine Clearance: 48 mL/min (by C-G formula based on SCr of 0.94 mg/dL). Liver Function Tests: No results for input(s): "AST", "ALT", "ALKPHOS", "BILITOT", "PROT", "ALBUMIN" in the last 168 hours.  No results for input(s): "LIPASE", "AMYLASE" in the last 168 hours. No results for input(s): "AMMONIA" in the last 168 hours. Coagulation Profile: No results for input(s): "INR", "PROTIME" in the last 168 hours.  Cardiac Enzymes: No results for input(s): "CKTOTAL", "CKMB", "CKMBINDEX", "TROPONINI" in the last 168 hours. BNP (last 3 results) No results for input(s): "PROBNP" in the last 8760 hours. HbA1C: No results for input(s): "HGBA1C" in the last 72 hours. CBG: No results for input(s): "GLUCAP" in the last 168 hours. Lipid Profile: No results for input(s): "CHOL", "HDL", "LDLCALC", "TRIG", "CHOLHDL", "LDLDIRECT" in the last 72 hours. Thyroid Function Tests: No results for input(s): "TSH", "T4TOTAL", "FREET4", "T3FREE", "THYROIDAB" in the last 72 hours. Anemia Panel: No results for input(s): "VITAMINB12", "FOLATE", "FERRITIN", "TIBC", "IRON", "RETICCTPCT" in the last 72 hours. Sepsis Labs: No results for input(s): "PROCALCITON", "LATICACIDVEN" in the last 168 hours.  No results found for this or any previous visit (from the past 240 hours).        Radiology Studies: No results found.       Scheduled Meds:  aspirin EC  81 mg Oral Daily   atorvastatin  80 mg Oral Daily   clopidogrel  75 mg Oral Daily   dicyclomine  10 mg Oral TID AC   enoxaparin (LOVENOX) injection  40 mg Subcutaneous Q24H   ferrous sulfate  325 mg Oral Daily   folic acid  1 mg Oral Daily   Gerhardt's butt cream   Topical QID   influenza vaccine adjuvanted  0.5 mL Intramuscular Tomorrow-1000   isosorbide mononitrate  60 mg Oral BID   levothyroxine  88 mcg Oral Q0600   lidocaine  2 patch Transdermal Q24H   midodrine  10 mg Oral TID WC   montelukast  10 mg Oral QHS   potassium  & sodium phosphates  1 packet Oral TID WC & HS   psyllium  1 packet Oral Daily   ranolazine  500 mg Oral BID   Continuous Infusions:  sodium chloride 20 mL/hr at 08/13/23 2228     LOS: 11 days     Tresa Moore, MD [facility]   If 7PM-7AM, please contact night-coverage  08/14/2023, 3:05 PM

## 2023-08-14 NOTE — Progress Notes (Signed)
Physical Therapy Treatment Patient Details Name: Abigail Ortega MRN: [id number] DOB: September 19, 1950 Today's Date: 08/14/2023   History of Present Illness Pt is a 73 y.o. female presenting to hospital 08/02/23 from gastroenterology clinic with c/o hypotension, generalized weakness, and recurrent diarrhea.  Pt admitted with acute colitis, hypokalemia, chronic hypotension, severe hypothyroidism, CAD, and chronic combined systolic and diastolic CHF.  PMH includes C. Difficile colitis, CAD s/p dES, R breast CA s/p chemoradiation and mastectomy, hypothyroidism, htn, HLD, osteoporosis, anemia, CHF, IBS.    PT Comments  Patient declined getting out of bed this session. Min A required for rolling to the left and for posterior hip scooting for repositioning for optimal positioning to eat from bed level. Cues provided to increase independence with routine mobility.  PT will continue to follow to maximize independence and facilitate return to prior level of function. Rehabilitation < 3 hours/day recommended after this hospital stay.    If plan is discharge home, recommend the following: A little help with walking and/or transfers;A little help with bathing/dressing/bathroom;Assistance with cooking/housework;Assist for transportation;Help with stairs or ramp for entrance   Can travel by private vehicle     No  Equipment Recommendations   (to  be determined at next level of care)    Recommendations for Other Services       Precautions / Restrictions Precautions Precautions: Fall Restrictions Weight Bearing Restrictions Per Provider Order: No     Mobility  Bed Mobility Overal bed mobility: Needs Assistance Bed Mobility: Rolling Rolling: Min assist         General bed mobility comments: rolling to left with Min A and Min A required for posterior hip scooting for optimal positioning in bed for eating. cues for technique to facilitate independence with repositioning    Transfers                    General transfer comment: patient declined    Ambulation/Gait                   Stairs             Wheelchair Mobility     Tilt Bed    Modified Rankin (Stroke Patients Only)       Balance Overall balance assessment: Needs assistance Sitting-balance support:  (no UE support, long sitting) Sitting balance-Leahy Scale: Fair Sitting balance - Comments: L side lean with long sitting                                    Cognition Arousal: Alert Behavior During Therapy: WFL for tasks assessed/performed Overall Cognitive Status: No family/caregiver present to determine baseline cognitive functioning                                 General Comments: patient able to follow commands with increased time.        Exercises      General Comments General comments (skin integrity, edema, etc.): activity progression is limited by patient's willingness to mobilize or get out of bed.      Pertinent Vitals/Pain Pain Assessment Pain Assessment: No/denies pain    Home Living                          Prior Function  PT Goals (current goals can now be found in the care plan section) Acute Rehab PT Goals Patient Stated Goal: to improve strength and walking PT Goal Formulation: With patient Time For Goal Achievement: 08/28/23 Potential to Achieve Goals: Fair Progress towards PT goals: Progressing toward goals (care plan extended x 2 weeks)    Frequency    Min 1X/week      PT Plan      Co-evaluation              AM-PAC PT "6 Clicks" Mobility   Outcome Measure  Help needed turning from your back to your side while in a flat bed without using bedrails?: A Little Help needed moving from lying on your back to sitting on the side of a flat bed without using bedrails?: A Little Help needed moving to and from a bed to a chair (including a wheelchair)?: A Little Help needed standing up from a  chair using your arms (e.g., wheelchair or bedside chair)?: A Little Help needed to walk in hospital room?: A Little Help needed climbing 3-5 steps with a railing? : A Lot 6 Click Score: 17    End of Session   Activity Tolerance: Patient tolerated treatment well Patient left: in chair;with call bell/phone within reach;with bed alarm set (sitting upright in bed for eating) Nurse Communication: Mobility status PT Visit Diagnosis: Other abnormalities of gait and mobility (R26.89);Muscle weakness (generalized) (M62.81)     Time: 1610-9604 PT Time Calculation (min) (ACUTE ONLY): 10 min  Charges:    $Therapeutic Activity: 8-22 mins PT General Charges $$ ACUTE PT VISIT: 1 Visit                     Donna Bernard, PT, MPT    Ina Homes 08/14/2023, 2:16 PM

## 2023-08-14 NOTE — Care Management Important Message (Signed)
Important Message  Patient Details  Name: Abigail Ortega MRN: [id number] Date of Birth: October 01, 1950   Important Message Given:  Yes - Medicare IM     Sherilyn Banker 08/14/2023, 10:57 AM

## 2023-08-14 NOTE — Progress Notes (Signed)
Mobility Specialist - Progress Note   08/14/23 1000  Mobility  Activity Refused mobility     Pt declined mobility at this time, no reason specified. Pt reports she's too cold to sit in chair or get out of bed right now and requests to return later. Will attempt another date/time.    Filiberto Pinks Mobility Specialist 08/14/23, 10:20 AM

## 2023-08-15 ENCOUNTER — Inpatient Hospital Stay: Payer: Medicare HMO

## 2023-08-15 DIAGNOSIS — A0472 Enterocolitis due to Clostridium difficile, not specified as recurrent: Secondary | ICD-10-CM | POA: Diagnosis not present

## 2023-08-15 LAB — CELIAC DISEASE PANEL
Endomysial Ab, IgA: NEGATIVE
IgA: 261 mg/dL (ref 64–422)
Tissue Transglutaminase Ab, IgA: <2 U/mL (ref 0–3)

## 2023-08-15 LAB — BASIC METABOLIC PANEL
Anion gap: 10 (ref 5–15)
BUN: 13 mg/dL (ref 8–23)
CO2: 23 mmol/L (ref 22–32)
Calcium: 8 mg/dL — ABNORMAL LOW (ref 8.9–10.3)
Chloride: 98 mmol/L (ref 98–111)
Creatinine, Ser: 0.97 mg/dL (ref 0.44–1.00)
GFR, Estimated: >60 mL/min (ref >60)
Glucose, Bld: 89 mg/dL (ref 70–99)
Potassium: 4.1 mmol/L (ref 3.5–5.1)
Sodium: 131 mmol/L — ABNORMAL LOW (ref 135–145)

## 2023-08-15 LAB — CBC WITH DIFFERENTIAL/PLATELET
Abs Immature Granulocytes: 0.03 10*3/uL (ref 0.00–0.07)
Basophils Absolute: 0.1 10*3/uL (ref 0.0–0.1)
Basophils Relative: 1 %
Eosinophils Absolute: 0.1 10*3/uL (ref 0.0–0.5)
Eosinophils Relative: 1 %
HCT: 18 % — ABNORMAL LOW (ref 36.0–46.0)
Hemoglobin: 6.1 g/dL — ABNORMAL LOW (ref 12.0–15.0)
Immature Granulocytes: 1 %
Lymphocytes Relative: 44 %
Lymphs Abs: 2.8 10*3/uL (ref 0.7–4.0)
MCH: 31.8 pg (ref 26.0–34.0)
MCHC: 33.9 g/dL (ref 30.0–36.0)
MCV: 93.8 fL (ref 80.0–100.0)
Monocytes Absolute: 0.3 10*3/uL (ref 0.1–1.0)
Monocytes Relative: 5 %
Neutro Abs: 3 10*3/uL (ref 1.7–7.7)
Neutrophils Relative %: 48 %
Platelets: 308 10*3/uL (ref 150–400)
RBC: 1.92 MIL/uL — ABNORMAL LOW (ref 3.87–5.11)
RDW: 15.9 % — ABNORMAL HIGH (ref 11.5–15.5)
WBC: 6.3 10*3/uL (ref 4.0–10.5)
nRBC: 0 % (ref 0.0–0.2)

## 2023-08-15 LAB — PHOSPHORUS: Phosphorus: 2.4 mg/dL — ABNORMAL LOW (ref 2.5–4.6)

## 2023-08-15 LAB — PREPARE RBC (CROSSMATCH)

## 2023-08-15 LAB — MAGNESIUM: Magnesium: 1.8 mg/dL (ref 1.7–2.4)

## 2023-08-15 MED ORDER — MAGNESIUM SULFATE 2 GM/50ML IV SOLN
2.0000 g | Freq: Once | INTRAVENOUS | Status: AC
  Administered 2023-08-15: 2 g via INTRAVENOUS
  Filled 2023-08-15: qty 50

## 2023-08-15 MED ORDER — SODIUM CHLORIDE 0.9% IV SOLUTION: Freq: Once | INTRAVENOUS | Status: AC

## 2023-08-15 MED ORDER — IOHEXOL 350 MG/ML SOLN
100.0000 mL | Freq: Once | INTRAVENOUS | Status: AC | PRN
  Administered 2023-08-15: 100 mL via INTRAVENOUS

## 2023-08-15 MED ORDER — POTASSIUM & SODIUM PHOSPHATES 280-160-250 MG PO PACK
2.0000 | PACK | Freq: Two times a day (BID) | ORAL | Status: DC
Start: 2023-08-15 — End: 2023-08-15

## 2023-08-15 MED ORDER — POTASSIUM & SODIUM PHOSPHATES 280-160-250 MG PO PACK
1.0000 | PACK | Freq: Two times a day (BID) | ORAL | Status: AC
  Administered 2023-08-15: 1 via ORAL
  Filled 2023-08-15: qty 1

## 2023-08-15 NOTE — Progress Notes (Signed)
  Abigail Minium, MD [facility]   [address], [address] [place], [place] [address] Phone: [phone] Fax : [phone]   Subjective: The patient reports the diarrhea is bette and the biopsies were normal.   Objective: Vital signs in last 24 hours: Vitals:   08/14/23 2008 08/15/23 0221 08/15/23 0500 08/15/23 0751  BP: 98/67 94/66  98/66  Pulse: 90 96  100  Resp: 18 18    Temp: 99.1 F (37.3 C) 98.7 F (37.1 C)  98.6 F (37 C)  TempSrc: Oral Oral  Oral  SpO2: 100% 99%  100%  Weight:   64.4 kg   Height:       Weight change: 2.3 kg  Intake/Output Summary (Last 24 hours) at 08/15/2023 5643 Last data filed at 08/15/2023 0600 Gross per 24 hour  Intake --  Output 650 ml  Net -650 ml     Exam: Heart:: Regular rate and rhythm or without murmur or extra heart sounds Lungs: normal and clear to auscultation and percussion Abdomen: soft, nontender, normal bowel sounds   Lab Results: @LABTEST2 @ Micro Results: No results found for this or any previous visit (from the past 240 hours). Studies/Results: No results found. Medications: I have reviewed the patient's current medications. Scheduled Meds:  aspirin EC  81 mg Oral Daily   atorvastatin  80 mg Oral Daily   clopidogrel  75 mg Oral Daily   dicyclomine  10 mg Oral TID AC   enoxaparin (LOVENOX) injection  40 mg Subcutaneous Q24H   ferrous sulfate  325 mg Oral Daily   folic acid  1 mg Oral Daily   Gerhardt's butt cream   Topical QID   influenza vaccine adjuvanted  0.5 mL Intramuscular Tomorrow-1000   isosorbide mononitrate  60 mg Oral BID   levothyroxine  88 mcg Oral Q0600   lidocaine  2 patch Transdermal Q24H   midodrine  10 mg Oral TID WC   montelukast  10 mg Oral QHS   potassium & sodium phosphates  1 packet Oral BID WC   psyllium  1 packet Oral Daily   ranolazine  500 mg Oral BID   Continuous Infusions:  sodium chloride 20 mL/hr at 08/13/23 2228   magnesium sulfate bolus IVPB     PRN Meds:.acetaminophen **OR**  acetaminophen, loperamide, ondansetron (ZOFRAN) IV, ondansetron **OR** [DISCONTINUED] ondansetron (ZOFRAN) IV, oxyCODONE, traZODone   Assessment: Principal Problem:   C. difficile colitis Active Problems:   Chronic combined systolic and diastolic CHF (congestive heart failure) (HCC)   CAD (coronary artery disease)   Chronic hypotension   Hypokalemia   Severe hypothyroidism   Folate deficiency   Normocytic anemia   Hypomagnesemia   Diarrhea of presumed infectious origin   Loss of weight   Diarrhea    Plan: The patient is doing well without any complaints. She has no sign of colitis and biopsies were normal. Patient should follow up with Dr. Tobi Bastos after discharge. Nothing further to do from a GI point of view as an inpatient. Most likely post infectious IBD.  I will sign off.  Please call if any further GI concerns or questions.  We would like to thank you for the opportunity to participate in the care of Abigail Ortega.    LOS: 12 days   Abigail Minium, MD.FACG 08/15/2023, 9:11 AM Pager [phone] 7am-5pm  Check AMION for 5pm -7am coverage and on weekends

## 2023-08-15 NOTE — Progress Notes (Signed)
Mobility Specialist - Progress Note   08/15/23 1600  Mobility  Activity Contraindicated/medical hold     Pt just began receiving blood at time of attempt. Will attempt another date/time as medically appropriate.    Filiberto Pinks Mobility Specialist 08/15/23, 4:47 PM

## 2023-08-15 NOTE — Progress Notes (Signed)
After seeing the patient this morning I was notified by the hospitalist that the patient is having bright red blood per rectum which the patient did not hide to me this morning.  She stated that she was doing better.  The patient has undergone an EGD and colonoscopy both within the last 2 days and no source of any active bleeding was seen.  There is also no sign of colitis that was reported on imaging and multiple biopsies done at that time.  I have recommended to the hospitalist that since no sign of GI bleeding was seen during with the procedures the patient should undergo a CT angiography stat to look for where the patient's bleeding may be coming from.

## 2023-08-15 NOTE — Plan of Care (Signed)

## 2023-08-15 NOTE — Progress Notes (Signed)
Progress Note   Patient: Abigail Ortega XBJ:[id number] DOB: June 04, 1951 DOA: 08/02/2023     12 DOS: the patient was seen and examined on 08/15/2023   Brief hospital course: 72yo with h/o CAD s/p DES, breast CA s/p chemorads and mastectomy, hypothyroidism, HTN, HLD, and recent C. difficile colitis s/p treatment with vancomycin (December 2024)  who presented on 1/16 with persistent diarrhea and abdominal pain, concerning for treatment failure vs. Recurrence.  C diff PCR and antigen +, toxin -.  GI/ID consulted.  1/21 CT with mild-moderate colitis.  1/27 colonoscopy with diverticulosis, normal biopsies.  1/27 EGD with medium-sized hiatal hernia, grade C esophagitis.  GI suspects postinfectious IBS and recommends anti-diarrheal medication with outpatient GI follow-up.  Assessment and Plan:  Postinfectious IBS Recent C diff infection, returned with persistent diarrhea C. difficile PCR positive but toxin was negative Patient completed 10 days of oral vancomycin and still had diarrhea Started on Dificid Infectious disease consulted 1/21 CT abdomen pelvis with mild to moderate colitis without evidence of abscess or perforation GI consulted - EGD and flex sig 1/27, biopsies unrevealing Flexi-Seal removed 1/22 Infectious diarrhea essentially ruled out at this point Started on stool bulking with psyllium and antidiarrheal medication (as needed Imodium) Will need outpatient follow-up with GI postdischarge (Dr. Tobi Bastos)  GI Bleed Developed acute lower GI bleeding with 1 stool on 1/28 PM and 2 on 1/29 AM STAT CT without obvious source, actually showed improving descending and sigmoid colitis Appears to be likely post-polypectomy bleeding Will hold Lovenox, add SCDs Will try to continue ASA and Plavix for now unless bleeding increases  ABLA Hgb dropped from 9.4 -> 7.2 -> 6.1 Patient previously refused transfusion but is now willing (confirmed in the presence of nursing) Will transfuse 2 units  PRBC Recheck CBC in AM  Lung nodule CT on 1/29 also showed an enlarging subpleural nodule in the anterior RML that is concerning for lung cancer vs. Met She will need outpatient oncology f/u for PET scan   Hypokalemia QT prolongation Likely in the setting of intractable diarrhea Monitor potassium and replace as needed Recheck EKG Avoid QT-prolonging medications where possible   Hypothyroidism Stable Continue Synthroid Repeat TSH and free T4 in 6 weeks as outpatient   Chronic hypotension Stable Continue midodrine   Coronary disease Stable. Continue ASA and Plavix for now but this may need to be held should bleeding persist Continue Imdur, Ranexa  HLD Continue atorvastatin   Chronic systolic and diastolic congestive heart failure  Appears hypovolemic secondary to intractable diarrhea Continue holding Lasix    Consultants: GI ID  Procedures: EGD 1/27 Colonoscopy 1/27  Antibiotics: Fidoxamicin 1/16-26  30 Day Unplanned Readmission Risk Score    Flowsheet Row ED to Hosp-Admission (Current) from 08/02/2023 in [facility]  30 Day Unplanned Readmission Risk Score (%) 34.25 Filed at 08/15/2023 0801       This score is the patient's risk of an unplanned readmission within 30 days of being discharged (0 -100%). The score is based on dignosis, age, lab data, medications, orders, and past utilization.   Low:  0-14.9   Medium: 15-21.9   High: 22-29.9   Extreme: 30 and above           Subjective: Reports bloody stools last night and this AM.  Agrees to blood transfusion.   Objective: Vitals:   08/15/23 1500 08/15/23 1527  BP: (!) 97/57 (!) 84/73  Pulse: 94 98  Resp: 16 16  Temp: 98.6 F (37  C) 98.7 F (37.1 C)  SpO2: 98% 96%    Intake/Output Summary (Last 24 hours) at 08/15/2023 1649 Last data filed at 08/15/2023 0600 Gross per 24 hour  Intake --  Output 650 ml  Net -650 ml   Filed Weights   08/13/23 0420  08/14/23 0353 08/15/23 0500  Weight: 62.1 kg 62.1 kg 64.4 kg    Exam:  General:  Appears calm and comfortable and is in NAD Eyes:  EOMI, normal lids, iris ENT:  grossly normal hearing, lips & tongue, mmm; edentulous Neck:  no LAD, masses or thyromegaly Cardiovascular:  RRR, no m/r/g. No LE edema.  Respiratory:   CTA bilaterally with no wheezes/rales/rhonchi.  Normal respiratory effort. Abdomen:  soft, NT, ND Skin:  no rash or induration seen on limited exam Musculoskeletal:  grossly normal tone BUE/BLE, good ROM, no bony abnormality Psychiatric: blunted mood and affect, speech fluent and appropriate, AOx3 Neurologic:  CN 2-12 grossly intact, moves all extremities in coordinated fashion  Data Reviewed: I have reviewed the patient's lab results since admission.  Pertinent labs for today include:   Na++ 131 Phos 2.4 Hgb 6.1, down from 7.2   Family Communication: None present; I attempted to reach her sister by telephone without success  Disposition: Status is: Inpatient Remains inpatient appropriate because: GI bleeding     Time spent: 50 minutes  Unresulted Labs (From admission, onward)     Start     Ordered   08/16/23 0500  Renal function panel  Tomorrow morning,   R       Question:  Specimen collection method  Answer:  Lab=Lab collect   08/15/23 0738   08/16/23 0500  CBC with Differential/Platelet  Tomorrow morning,   R       Question:  Specimen collection method  Answer:  Lab=Lab collect   08/15/23 1638   08/12/23 1147  Calprotectin, Fecal  Once,   R       Comments: SNR    08/12/23 1147             Author: Jonah Blue, MD 08/15/2023 4:49 PM  For on call review [email or web address].

## 2023-08-15 NOTE — Progress Notes (Signed)
PHARMACY CONSULT NOTE - FOLLOW UP  Pharmacy Consult for Electrolyte Monitoring and Replacement   Recent Labs: Potassium (mmol/L)  Date Value  08/15/2023 4.1  03/20/2014 3.4 (L)   Magnesium (mg/dL)  Date Value  60/45/4098 1.8  02/23/2014 1.7 (L)   Calcium (mg/dL)  Date Value  11/91/4782 8.0 (L)   Calcium, Total (mg/dL)  Date Value  95/62/1308 9.6   Albumin (g/dL)  Date Value  65/78/4696 2.1 (L)  11/24/2022 4.3  03/20/2014 3.4   Phosphorus (mg/dL)  Date Value  29/52/8413 2.4 (L)   Sodium (mmol/L)  Date Value  08/15/2023 131 (L)  11/24/2022 137  03/20/2014 135 (L)   Corrected Ca: 9.5   (alb 2.1    Ca 8.0)  Diet: reg Fluids: none Pertinent Medications: fidaxomicin (Cdiff diarrhea),   Assessment: PM is a 73 yo female who presented for follow-up due to hx of C. Difficile. They have had severe diarrhea, weight-loss and weakness. They arrived hypotensive and confused today. Pharmacy has been consulted to manage this patient's electrolytes.  1/29: Infectious diarrhea essentially ruled out at this point. Stool bulking and antidiarrheal added   Goal of Therapy:  Electrolytes WNL   Plan:  Phos 2.4  Will order Phos-Nak 1 packet po bid x 2 doses Mag 1.8  Will order Magnesium 2 gm IV x1 Will recheck BMP with AM labs given continued diarrhea   Valery Amedee A, PharmD 08/15/2023 7:24 AM

## 2023-08-16 DIAGNOSIS — A0472 Enterocolitis due to Clostridium difficile, not specified as recurrent: Secondary | ICD-10-CM | POA: Diagnosis not present

## 2023-08-16 LAB — BASIC METABOLIC PANEL
Anion gap: 11 (ref 5–15)
BUN: 16 mg/dL (ref 8–23)
CO2: 22 mmol/L (ref 22–32)
Calcium: 8.2 mg/dL — ABNORMAL LOW (ref 8.9–10.3)
Chloride: 97 mmol/L — ABNORMAL LOW (ref 98–111)
Creatinine, Ser: 0.92 mg/dL (ref 0.44–1.00)
GFR, Estimated: >60 mL/min (ref >60)
Glucose, Bld: 92 mg/dL (ref 70–99)
Potassium: 3.9 mmol/L (ref 3.5–5.1)
Sodium: 130 mmol/L — ABNORMAL LOW (ref 135–145)

## 2023-08-16 LAB — CBC WITH DIFFERENTIAL/PLATELET
Abs Immature Granulocytes: 0.06 10*3/uL (ref 0.00–0.07)
Basophils Absolute: 0.1 10*3/uL (ref 0.0–0.1)
Basophils Relative: 1 %
Eosinophils Absolute: 0.2 10*3/uL (ref 0.0–0.5)
Eosinophils Relative: 2 %
HCT: 27.2 % — ABNORMAL LOW (ref 36.0–46.0)
Hemoglobin: 9.9 g/dL — ABNORMAL LOW (ref 12.0–15.0)
Immature Granulocytes: 1 %
Lymphocytes Relative: 25 %
Lymphs Abs: 2.1 10*3/uL (ref 0.7–4.0)
MCH: 30.9 pg (ref 26.0–34.0)
MCHC: 36.4 g/dL — ABNORMAL HIGH (ref 30.0–36.0)
MCV: 85 fL (ref 80.0–100.0)
Monocytes Absolute: 0.4 10*3/uL (ref 0.1–1.0)
Monocytes Relative: 5 %
Neutro Abs: 5.4 10*3/uL (ref 1.7–7.7)
Neutrophils Relative %: 66 %
Platelets: 269 10*3/uL (ref 150–400)
RBC: 3.2 MIL/uL — ABNORMAL LOW (ref 3.87–5.11)
RDW: 16.3 % — ABNORMAL HIGH (ref 11.5–15.5)
WBC: 8.2 10*3/uL (ref 4.0–10.5)
nRBC: 0 % (ref 0.0–0.2)

## 2023-08-16 LAB — RENAL FUNCTION PANEL
Albumin: 2.4 g/dL — ABNORMAL LOW (ref 3.5–5.0)
Anion gap: 9 (ref 5–15)
BUN: 16 mg/dL (ref 8–23)
CO2: 22 mmol/L (ref 22–32)
Calcium: 8 mg/dL — ABNORMAL LOW (ref 8.9–10.3)
Chloride: 98 mmol/L (ref 98–111)
Creatinine, Ser: 0.87 mg/dL (ref 0.44–1.00)
GFR, Estimated: >60 mL/min (ref >60)
Glucose, Bld: 78 mg/dL (ref 70–99)
Phosphorus: 2.2 mg/dL — ABNORMAL LOW (ref 2.5–4.6)
Potassium: 3.8 mmol/L (ref 3.5–5.1)
Sodium: 129 mmol/L — ABNORMAL LOW (ref 135–145)

## 2023-08-16 LAB — TYPE AND SCREEN
ABO/RH(D): O POS
Antibody Screen: NEGATIVE
Unit division: 0
Unit division: 0

## 2023-08-16 LAB — CBC
HCT: 27.4 % — ABNORMAL LOW (ref 36.0–46.0)
Hemoglobin: 9.7 g/dL — ABNORMAL LOW (ref 12.0–15.0)
MCH: 31.1 pg (ref 26.0–34.0)
MCHC: 35.4 g/dL (ref 30.0–36.0)
MCV: 87.8 fL (ref 80.0–100.0)
Platelets: 280 10*3/uL (ref 150–400)
RBC: 3.12 MIL/uL — ABNORMAL LOW (ref 3.87–5.11)
RDW: 16.5 % — ABNORMAL HIGH (ref 11.5–15.5)
WBC: 7.6 10*3/uL (ref 4.0–10.5)
nRBC: 0 % (ref 0.0–0.2)

## 2023-08-16 LAB — BPAM RBC
Blood Product Expiration Date: 202502112359
Blood Product Expiration Date: 202502112359
ISSUE DATE / TIME: 202501291514
ISSUE DATE / TIME: 202501291743
Unit Type and Rh: 5100
Unit Type and Rh: 5100

## 2023-08-16 LAB — HEMOGLOBIN AND HEMATOCRIT, BLOOD
HCT: 27.7 % — ABNORMAL LOW (ref 36.0–46.0)
Hemoglobin: 10 g/dL — ABNORMAL LOW (ref 12.0–15.0)

## 2023-08-16 MED ORDER — POTASSIUM & SODIUM PHOSPHATES 280-160-250 MG PO PACK
2.0000 | PACK | ORAL | Status: AC
Start: 2023-08-16 — End: 2023-08-16
  Administered 2023-08-16 (×2): 2 via ORAL
  Filled 2023-08-16 (×2): qty 2

## 2023-08-16 NOTE — TOC Progression Note (Signed)
Transition of Care [facility]) - Progression Note    Patient Details  Name: Abigail Ortega MRN: [id number] Date of Birth: 12-10-1950  Transition of Care [facility]) CM/SW Contact  Margarito Liner, LCSW Phone Number: 08/16/2023, 11:16 AM  Clinical Narrative:   Per MD, patient may be ready for discharge tomorrow. CSW started insurance authorization and left a message for the SNF admissions coordinator to confirm they would have a bed.  Expected Discharge Plan: Skilled Nursing Facility Barriers to Discharge: Continued Medical Work up  Expected Discharge Plan and Services       Living arrangements for the past 2 months: Single Family Home                                       Social Determinants of Health (SDOH) Interventions SDOH Screenings   Food Insecurity: No Food Insecurity (08/03/2023)  Housing: Low Risk  (08/03/2023)  Transportation Needs: No Transportation Needs (08/03/2023)  Utilities: Not At Risk (08/03/2023)  Social Connections: Moderately Integrated (08/03/2023)  Tobacco Use: Medium Risk (08/02/2023)    Readmission Risk Interventions     No data to display

## 2023-08-16 NOTE — Progress Notes (Signed)
Progress Note   Patient: Abigail Ortega ZOX:[id number] DOB: 1951-03-04 DOA: 08/02/2023     13 DOS: the patient was seen and examined on 08/16/2023   Brief hospital course: 72yo with h/o CAD s/p DES, breast CA s/p chemorads and mastectomy, hypothyroidism, HTN, HLD, and recent C. difficile colitis s/p treatment with vancomycin (December 2024)  who presented on 1/16 with persistent diarrhea and abdominal pain, concerning for treatment failure vs. Recurrence.  C diff PCR and antigen +, toxin -.  GI/ID consulted.  1/21 CT with mild-moderate colitis.  1/27 colonoscopy with diverticulosis, normal biopsies.  1/27 EGD with medium-sized hiatal hernia, grade C esophagitis.  GI suspects postinfectious IBS and recommends anti-diarrheal medication with outpatient GI follow-up.  Assessment and Plan:  Postinfectious IBS Recent C diff infection, returned with persistent diarrhea C. difficile PCR positive but toxin was negative Patient completed 10 days of oral vancomycin and still had diarrhea Started on Dificid Infectious disease consulted 1/21 CT abdomen pelvis with mild to moderate colitis without evidence of abscess or perforation GI consulted - EGD and flex sig 1/27, biopsies unrevealing Flexi-Seal removed 1/22 Infectious diarrhea essentially ruled out at this point Started on stool bulking with psyllium and antidiarrheal medication (as needed Imodium) Will need outpatient follow-up with GI postdischarge (Dr. Tobi Bastos)   GI Bleed Developed acute lower GI bleeding with 1 stool on 1/28 PM and 2 on 1/29 AM STAT CT without obvious source, actually showed improving descending and sigmoid colitis Appears to be likely post-polypectomy bleeding Holding Lovenox, added SCDs Recurrent bleeding on 1/30, will recheck CBC Will try to continue ASA and Plavix for now unless bleeding increases   ABLA Hgb dropped from 9.4 -> 7.2 -> 6.1 -> 9.9 Patient previously refused transfusion but is now willing (confirmed in  the presence of nursing) Transfused 2 units PRBC on 1/29 Recheck CBC now and in AM   Lung nodule CT on 1/29 also showed an enlarging subpleural nodule in the anterior RML that is concerning for lung cancer vs. Met She will need outpatient oncology f/u for PET scan   Hypokalemia QT prolongation Likely in the setting of intractable diarrhea Monitor potassium and replace as needed Recheck EKG Avoid QT-prolonging medications where possible   Hypothyroidism Stable Continue Synthroid Repeat TSH and free T4 in 6 weeks as outpatient   Chronic hypotension Stable Continue midodrine   Coronary disease Stable. Continue ASA and Plavix for now but this may need to be held should bleeding persist Continue Imdur, Ranexa   HLD Continue atorvastatin   Chronic systolic and diastolic congestive heart failure  Appears hypovolemic secondary to intractable diarrhea Continue holding Lasix       Consultants: GI ID   Procedures: EGD 1/27 Colonoscopy 1/27   Antibiotics: Fidoxamicin 1/16-26    30 Day Unplanned Readmission Risk Score    Flowsheet Row ED to Hosp-Admission (Current) from 08/02/2023 in [facility]  30 Day Unplanned Readmission Risk Score (%) 32.25 Filed at 08/16/2023 0400       This score is the patient's risk of an unplanned readmission within 30 days of being discharged (0 -100%). The score is based on dignosis, age, lab data, medications, orders, and past utilization.   Low:  0-14.9   Medium: 15-21.9   High: 22-29.9   Extreme: 30 and above           Subjective: She was doing well this AM during my visit, but the RN messaged this afternoon to report 3 bloody BMs (  2 small and 1 large) today.     Objective: Vitals:   08/16/23 0258 08/16/23 0753  BP: 116/71 125/79  Pulse: 81 81  Resp: 18 16  Temp: 98.4 F (36.9 C) 97.9 F (36.6 C)  SpO2: 99% 99%    Intake/Output Summary (Last 24 hours) at 08/16/2023 0757 Last data  filed at 08/15/2023 2026 Gross per 24 hour  Intake 1433 ml  Output --  Net 1433 ml   Filed Weights   08/14/23 0353 08/15/23 0500 08/16/23 0257  Weight: 62.1 kg 64.4 kg 59.1 kg    Exam:  General:  Appears calm and comfortable and is in NAD Eyes:  EOMI, normal lids, iris ENT:  grossly normal hearing, lips & tongue, mmm; edentulous Neck:  no LAD, masses or thyromegaly Cardiovascular:  RRR, no m/r/g. No LE edema.  Respiratory:   CTA bilaterally with no wheezes/rales/rhonchi.  Normal respiratory effort. Abdomen:  soft, NT, ND Skin:  no rash or induration seen on limited exam Musculoskeletal:  grossly normal tone BUE/BLE, good ROM, no bony abnormality Psychiatric: blunted mood and affect, speech fluent and appropriate, AOx3 Neurologic:  CN 2-12 grossly intact, moves all extremities in coordinated fashion  Data Reviewed: I have reviewed the patient's lab results since admission.  Pertinent labs for today include:   Na++ 129 Phos 2.2 Albumin 2.4 WBC 8.2 Hgb 9.9    Family Communication: None present; I spoke with her sister by telephone  Disposition: Status is: Inpatient Remains inpatient appropriate because: still having bleeding     Time spent: 50 minutes  Unresulted Labs (From admission, onward)     Start     Ordered   08/12/23 1147  Calprotectin, Fecal  Once,   R       Comments: SNR    08/12/23 1147             Author: Jonah Blue, MD 08/16/2023 7:57 AM  For on call review [email or web address].

## 2023-08-16 NOTE — Progress Notes (Signed)
Occupational Therapy Treatment Patient Details Name: Abigail Ortega MRN: [id number] DOB: [birth year] Today's Date: 08/16/2023   History of present illness Pt is a 73 y.o. female presenting to hospital 08/02/23 from gastroenterology clinic with c/o hypotension, generalized weakness, and recurrent diarrhea.  Pt admitted with acute colitis, hypokalemia, chronic hypotension, severe hypothyroidism, CAD, and chronic combined systolic and diastolic CHF.  PMH includes C. Difficile colitis, CAD s/p dES, R breast CA s/p chemoradiation and mastectomy, hypothyroidism, htn, HLD, osteoporosis, anemia, CHF, IBS.   OT comments  Pt is supine in bed on arrival. Pleasant and agreeable to OT session. She denies pain. Pt performed bed mobility with CGA to SUP, extra time and cueing for bed rail use. Able to forward scoot with SUP. Multiple STS trials performed from EOB and recliner for peri-care during session d/t large bloody stool requiring full linen change. Pt needing Min A for STS from both surface and CGA for SPT to recliner for safety. Cueing for hand placement and anterior weight shift. Pt fatigued and upset that she is still bleeding, nurse notified and aware of issues with GI consulted and signed off.  Pt returned to bed with all needs in place and will cont to require skilled acute OT services to maximize her safety and IND to return to PLOF.       If plan is discharge home, recommend the following:  A little help with walking and/or transfers;Assistance with cooking/housework;Assist for transportation;Help with stairs or ramp for entrance;A lot of help with bathing/dressing/bathroom   Equipment Recommendations  Other (comment) (defer to next venue)    Recommendations for Other Services      Precautions / Restrictions Precautions Precautions: Fall Restrictions Weight Bearing Restrictions Per Provider Order: No       Mobility Bed Mobility Overal bed mobility: Needs Assistance Bed Mobility:  Supine to Sit, Sit to Supine     Supine to sit: Contact guard, Min assist Sit to supine: Contact guard assist, Supervision   General bed mobility comments: Min to SUP for bed mobility; able to forward scoot with SUP    Transfers Overall transfer level: Needs assistance Equipment used: Rolling walker (2 wheels) Transfers: Sit to/from Stand Sit to Stand: Min assist           General transfer comment: Min A for STS from EOB and recliner x multiple trials for hygiene/peri-care and full linen change from bloody stool     Balance Overall balance assessment: Needs assistance Sitting-balance support: No upper extremity supported, Feet supported Sitting balance-Leahy Scale: Good Sitting balance - Comments: no LOB while seated EOB   Standing balance support: Bilateral upper extremity supported, Reliant on assistive device for balance Standing balance-Leahy Scale: Fair Standing balance comment: CGA and RW use for standing balance during transfer and standing for peri-care                           ADL either performed or assessed with clinical judgement   ADL Overall ADL's : Needs assistance/impaired                         Toilet Transfer: Contact guard assist;Rolling walker (2 wheels) Toilet Transfer Details (indicate cue type and reason): simulated to recliner Toileting- Clothing Manipulation and Hygiene: Maximal assistance;Total assistance;Sit to/from stand Toileting - Clothing Manipulation Details (indicate cue type and reason): large amounts of bloody stool that has been an ongoing issue with GI already consulted and  signed off            Extremity/Trunk Assessment              Vision       Perception     Praxis      Cognition Arousal: Alert Behavior During Therapy: WFL for tasks assessed/performed Overall Cognitive Status: Within Functional Limits for tasks assessed                                 General Comments:  patient able to follow single step commands without difficulty.        Exercises      Shoulder Instructions       General Comments encouraged AROM of BLE in bed for strengthening as able in preparation for increased independence with transfers    Pertinent Vitals/ Pain       Pain Assessment Pain Assessment: No/denies pain  Home Living                                          Prior Functioning/Environment              Frequency  Min 1X/week        Progress Toward Goals  OT Goals(current goals can now be found in the care plan section)  Progress towards OT goals: Progressing toward goals  Acute Rehab OT Goals Patient Stated Goal: improve strength OT Goal Formulation: With patient Time For Goal Achievement: 08/30/23 Potential to Achieve Goals: Good  Plan      Co-evaluation                 AM-PAC OT "6 Clicks" Daily Activity     Outcome Measure   Help from another person eating meals?: None Help from another person taking care of personal grooming?: A Little Help from another person toileting, which includes using toliet, bedpan, or urinal?: A Lot Help from another person bathing (including washing, rinsing, drying)?: A Lot Help from another person to put on and taking off regular upper body clothing?: A Little Help from another person to put on and taking off regular lower body clothing?: A Lot 6 Click Score: 16    End of Session Equipment Utilized During Treatment: Rolling walker (2 wheels)  OT Visit Diagnosis: Other abnormalities of gait and mobility (R26.89);Muscle weakness (generalized) (M62.81)   Activity Tolerance Patient tolerated treatment well   Patient Left with call bell/phone within reach;in bed;with bed alarm set   Nurse Communication          Time: 1610-9604 OT Time Calculation (min): 28 min  Charges: OT General Charges $OT Visit: 1 Visit OT Treatments $Self Care/Home Management : 8-22  mins $Therapeutic Activity: 8-22 mins  Dusty Raczkowski, OTR/L  08/16/23, 3:03 PM   Rabecca Birge E Silva Aamodt 08/16/2023, 3:00 PM

## 2023-08-16 NOTE — Progress Notes (Signed)
Physical Therapy Treatment Patient Details Name: Abigail Ortega MRN: [id number] DOB: June 08, 1951 Today's Date: 08/16/2023   History of Present Illness Pt is a 73 y.o. female presenting to hospital 08/02/23 from gastroenterology clinic with c/o hypotension, generalized weakness, and recurrent diarrhea.  Pt admitted with acute colitis, hypokalemia, chronic hypotension, severe hypothyroidism, CAD, and chronic combined systolic and diastolic CHF.  PMH includes C. Difficile colitis, CAD s/p dES, R breast CA s/p chemoradiation and mastectomy, hypothyroidism, htn, HLD, osteoporosis, anemia, CHF, IBS.    PT Comments  Patient is agreeable to PT session. She continues to require physical assistance with bed mobility and transfers. Standing balance is poor with external support required with heavy reliance on rolling walker for support. Pre-gait activity performed including side steps with focus on upright standing posture. Recommend to continue PT to maximize independence. Rehabilitation < 3 hours/day recommended after this hospital stay.    If plan is discharge home, recommend the following: A little help with walking and/or transfers;A little help with bathing/dressing/bathroom;Assistance with cooking/housework;Assist for transportation;Help with stairs or ramp for entrance   Can travel by private vehicle     No  Equipment Recommendations   (to be determined at next level of care)    Recommendations for Other Services       Precautions / Restrictions Precautions Precautions: Fall Restrictions Weight Bearing Restrictions Per Provider Order: No     Mobility  Bed Mobility Overal bed mobility: Needs Assistance Bed Mobility: Supine to Sit, Sit to Supine     Supine to sit: Min assist Sit to supine: Min assist   General bed mobility comments: assistance for trunk support to sit upright. assistance for LE support to return to bed. cues for technique    Transfers Overall transfer level:  Needs assistance Equipment used: Rolling walker (2 wheels) Transfers: Sit to/from Stand Sit to Stand: Min assist           General transfer comment: cues for technique to facilitate lift off. lifting assistance required    Ambulation/Gait             Pre-gait activities: patient able to take 2 small side steps to the right with Min A using rolling walker. standing activity tolerance limited by fatigue. cues for upright standing posture     Stairs             Wheelchair Mobility     Tilt Bed    Modified Rankin (Stroke Patients Only)       Balance Overall balance assessment: Needs assistance Sitting-balance support: No upper extremity supported, Feet supported Sitting balance-Leahy Scale: Fair     Standing balance support: Bilateral upper extremity supported Standing balance-Leahy Scale: Poor Standing balance comment: external support reuqired with heavy reliance on rolling walker for support                            Cognition Arousal: Alert Behavior During Therapy: WFL for tasks assessed/performed Overall Cognitive Status: Within Functional Limits for tasks assessed                                 General Comments: patient able to follow single step commands without difficulty.        Exercises      General Comments General comments (skin integrity, edema, etc.): encouraged AROM of BLE in bed for strengthening as able in preparation for increased independence  with transfers      Pertinent Vitals/Pain Pain Assessment Pain Assessment: No/denies pain    Home Living                          Prior Function            PT Goals (current goals can now be found in the care plan section) Acute Rehab PT Goals Patient Stated Goal: to improve strength and walking PT Goal Formulation: With patient Time For Goal Achievement: 08/28/23 Potential to Achieve Goals: Fair Progress towards PT goals: Progressing toward  goals    Frequency    Min 1X/week      PT Plan      Co-evaluation              AM-PAC PT "6 Clicks" Mobility   Outcome Measure  Help needed turning from your back to your side while in a flat bed without using bedrails?: A Little Help needed moving from lying on your back to sitting on the side of a flat bed without using bedrails?: A Little Help needed moving to and from a bed to a chair (including a wheelchair)?: A Little Help needed standing up from a chair using your arms (e.g., wheelchair or bedside chair)?: A Little Help needed to walk in hospital room?: A Little Help needed climbing 3-5 steps with a railing? : A Lot 6 Click Score: 17    End of Session   Activity Tolerance: Patient tolerated treatment well Patient left: in bed;with call bell/phone within reach;with bed alarm set   PT Visit Diagnosis: Other abnormalities of gait and mobility (R26.89);Muscle weakness (generalized) (M62.81)     Time: 1610-9604 PT Time Calculation (min) (ACUTE ONLY): 16 min  Charges:    $Therapeutic Activity: 8-22 mins PT General Charges $$ ACUTE PT VISIT: 1 Visit                     Donna Bernard, PT, MPT    Ina Homes 08/16/2023, 12:19 PM

## 2023-08-16 NOTE — Progress Notes (Signed)
The patient had been reported to have approximately 300 cc of bright red blood the night after having her colonoscopy with multiple biopsies throughout the colon and then a subsequent passage of blood which was reported to be 100 cc the following morning.  The patient was transfused and her hemoglobin has remained stable.  There is nothing seen on the EGD or colonoscopy to explain her bleeding but may have been due to her multiple biopsies.  Nothing to do from a GI point of view at this time.  I will sign off.  Please call if any further GI concerns or questions.  We would like to thank you for the opportunity to participate in the care of Abigail Ortega.

## 2023-08-16 NOTE — Progress Notes (Signed)
PHARMACY CONSULT NOTE - FOLLOW UP  Pharmacy Consult for Electrolyte Monitoring and Replacement   Recent Labs: Potassium (mmol/L)  Date Value  08/16/2023 3.8  03/20/2014 3.4 (L)   Magnesium (mg/dL)  Date Value  40/98/1191 1.8  02/23/2014 1.7 (L)   Calcium (mg/dL)  Date Value  47/82/9562 8.0 (L)   Calcium, Total (mg/dL)  Date Value  13/02/6577 9.6   Albumin (g/dL)  Date Value  46/96/2952 2.4 (L)  11/24/2022 4.3  03/20/2014 3.4   Phosphorus (mg/dL)  Date Value  84/13/2440 2.2 (L)   Sodium (mmol/L)  Date Value  08/16/2023 129 (L)  11/24/2022 137  03/20/2014 135 (L)   Corrected Ca: 9.5   (alb 2.1    Ca 8.0)  Diet: reg Fluids: none Pertinent Medications: fidaxomicin (Cdiff diarrhea),   Assessment: PM is a 73 yo female who presented for follow-up due to hx of C. Difficile. They have had severe diarrhea, weight-loss and weakness. They arrived hypotensive and confused today. Pharmacy has been consulted to manage this patient's electrolytes.  1/29: Infectious diarrhea essentially ruled out at this point. Stool bulking and antidiarrheal added. S/p colonoscopy w/ biopsies   Goal of Therapy:  Electrolytes WNL   Plan:  Phos 2.2  Will order Phos-Nak 2 packet po bid x 2 doses (Note: per MAR, pt only received 1 dose instead of the 2 doses ordered on 1/29)  Will recheck BMP, mag, phos with AM labs   Devita Nies A, PharmD 08/16/2023 9:11 AM

## 2023-08-17 DIAGNOSIS — A0472 Enterocolitis due to Clostridium difficile, not specified as recurrent: Secondary | ICD-10-CM | POA: Diagnosis not present

## 2023-08-17 LAB — CBC WITH DIFFERENTIAL/PLATELET
Abs Immature Granulocytes: 0.03 10*3/uL (ref 0.00–0.07)
Basophils Absolute: 0 10*3/uL (ref 0.0–0.1)
Basophils Relative: 1 %
Eosinophils Absolute: 0.1 10*3/uL (ref 0.0–0.5)
Eosinophils Relative: 1 %
HCT: 28.4 % — ABNORMAL LOW (ref 36.0–46.0)
Hemoglobin: 10.2 g/dL — ABNORMAL LOW (ref 12.0–15.0)
Immature Granulocytes: 0 %
Lymphocytes Relative: 26 %
Lymphs Abs: 2 10*3/uL (ref 0.7–4.0)
MCH: 31 pg (ref 26.0–34.0)
MCHC: 35.9 g/dL (ref 30.0–36.0)
MCV: 86.3 fL (ref 80.0–100.0)
Monocytes Absolute: 0.4 10*3/uL (ref 0.1–1.0)
Monocytes Relative: 6 %
Neutro Abs: 5.1 10*3/uL (ref 1.7–7.7)
Neutrophils Relative %: 66 %
Platelets: 311 10*3/uL (ref 150–400)
RBC: 3.29 MIL/uL — ABNORMAL LOW (ref 3.87–5.11)
RDW: 16.3 % — ABNORMAL HIGH (ref 11.5–15.5)
WBC: 7.7 10*3/uL (ref 4.0–10.5)
nRBC: 0 % (ref 0.0–0.2)

## 2023-08-17 LAB — BASIC METABOLIC PANEL
Anion gap: 11 (ref 5–15)
BUN: 14 mg/dL (ref 8–23)
CO2: 20 mmol/L — ABNORMAL LOW (ref 22–32)
Calcium: 8.2 mg/dL — ABNORMAL LOW (ref 8.9–10.3)
Chloride: 97 mmol/L — ABNORMAL LOW (ref 98–111)
Creatinine, Ser: 0.92 mg/dL (ref 0.44–1.00)
GFR, Estimated: >60 mL/min (ref >60)
Glucose, Bld: 88 mg/dL (ref 70–99)
Potassium: 3.8 mmol/L (ref 3.5–5.1)
Sodium: 128 mmol/L — ABNORMAL LOW (ref 135–145)

## 2023-08-17 LAB — MAGNESIUM: Magnesium: 1.9 mg/dL (ref 1.7–2.4)

## 2023-08-17 LAB — PHOSPHORUS: Phosphorus: 2.6 mg/dL (ref 2.5–4.6)

## 2023-08-17 MED ORDER — SODIUM CHLORIDE 0.9 % IV SOLN
12.5000 mg | Freq: Four times a day (QID) | INTRAVENOUS | Status: DC | PRN
  Administered 2023-08-17: 12.5 mg via INTRAVENOUS
  Filled 2023-08-17: qty 12.5

## 2023-08-17 MED ORDER — CALCIUM POLYCARBOPHIL 625 MG PO TABS
625.0000 mg | ORAL_TABLET | Freq: Every day | ORAL | Status: DC
  Administered 2023-08-17 – 2023-08-20 (×4): 625 mg via ORAL
  Filled 2023-08-17 (×4): qty 1

## 2023-08-17 NOTE — Plan of Care (Signed)
  Problem: Nutrition: Goal: Adequate nutrition will be maintained Outcome: Progressing   Problem: Elimination: Goal: Will not experience complications related to bowel motility Outcome: Progressing   

## 2023-08-17 NOTE — Consult Note (Signed)
[facility] Liaison Note  08/17/2023  Abigail Ortega 24-Jan-1951 [id number]  Location: RN Hospital Liaison screened the patient remotely at [facility].  Insurance: [facility] [address] [facility]   [name] is a 73 y.o. female who is a Primary Care Patient of Margaretann Loveless, MD [facility]. The patient was screened for  day readmission hospitalization with noted extreme risk score for unplanned readmission risk with 1 IP/3 ED in 6 months.  The patient was assessed for potential Care Management service needs for post hospital transition for care coordination. Review of patient's electronic medical record reveals patient C-Difficile. TOC documentation indicates pt pending SNF transition for STR with bed available via [facility] on Monday. Based upon the criteria for VBCI the facility will continue to address pt's ongoing needs post hospital discharge to the SNF level of care.  Plan: [facility] Liaison will continue to follow progress and disposition to asess for post hospital community care coordination/management needs.  Referral request for community care coordination: pending disposition.   VBCI Care Management/Population Health does not replace or interfere with any arrangements made by the Inpatient Transition of Care team.   For questions contact:   Elliot Cousin, RN, [facility] Liaison [place]   [facility], Population Health Office Hours MTWF  8:00 am-6:00 pm Direct Dial: [phone] mobile [phone] [Office toll free line] Office Hours are M-F 8:30 - 5 pm [email or web address]

## 2023-08-17 NOTE — Progress Notes (Signed)
PHARMACY CONSULT NOTE - FOLLOW UP  Pharmacy Consult for Electrolyte Monitoring and Replacement   Recent Labs: Potassium (mmol/L)  Date Value  08/17/2023 3.8  03/20/2014 3.4 (L)   Magnesium (mg/dL)  Date Value  60/45/4098 1.9  02/23/2014 1.7 (L)   Calcium (mg/dL)  Date Value  11/91/4782 8.2 (L)   Calcium, Total (mg/dL)  Date Value  95/62/1308 9.6   Albumin (g/dL)  Date Value  65/78/4696 2.4 (L)  11/24/2022 4.3  03/20/2014 3.4   Phosphorus (mg/dL)  Date Value  29/52/8413 2.6   Sodium (mmol/L)  Date Value  08/17/2023 128 (L)  11/24/2022 137  03/20/2014 135 (L)   Corrected Ca: 9.5   (alb 2.1    Ca 8.0)  Diet: reg Fluids: none Pertinent Medications: fidaxomicin (Cdiff diarrhea),   Assessment: PM is a 73 yo female who presented for follow-up due to hx of C. Difficile. They have had severe diarrhea, weight-loss and weakness. They arrived hypotensive and confused today. Pharmacy has been consulted to manage this patient's electrolytes.  1/29: Infectious diarrhea essentially ruled out at this point. Stool bulking and antidiarrheal added. S/p colonoscopy w/ biopsies   Goal of Therapy:  Electrolytes WNL   Plan:  No electrolyte replacement at this time Will recheck BMP,  phos with AM labs   Donta Fuster A, PharmD 08/17/2023 1:35 PM

## 2023-08-17 NOTE — Progress Notes (Signed)
Progress Note   Patient: Abigail Ortega ZOX:[id number] DOB: 06-20-1951 DOA: 08/02/2023     14 DOS: the patient was seen and examined on 08/17/2023   Brief hospital course: 72yo with h/o CAD s/p DES, breast CA s/p chemorads and mastectomy, hypothyroidism, HTN, HLD, and recent C. difficile colitis s/p treatment with vancomycin (December 2024)  who presented on 1/16 with persistent diarrhea and abdominal pain, concerning for treatment failure vs. Recurrence.  C diff PCR and antigen +, toxin -.  GI/ID consulted.  1/21 CT with mild-moderate colitis.  1/27 colonoscopy with diverticulosis, normal biopsies.  1/27 EGD with medium-sized hiatal hernia, grade C esophagitis.  GI suspects postinfectious IBS and recommends anti-diarrheal medication with outpatient GI follow-up.  Assessment and Plan:  Postinfectious IBS Recent C diff infection, returned with persistent diarrhea C. difficile PCR positive but toxin was negative Patient completed 10 days of oral vancomycin and still had diarrhea Started on Dificid Infectious disease consulted 1/21 CT abdomen pelvis with mild to moderate colitis without evidence of abscess or perforation GI consulted - EGD and flex sig 1/27, biopsies unrevealing Flexi-Seal removed 1/22 Infectious diarrhea essentially ruled out at this point Started on stool bulking with fiber and antidiarrheal medication (as needed Imodium) Will need outpatient follow-up with GI postdischarge (Dr. Tobi Bastos) Started with n/v on 1/31, continue to monitor with Zofran and prn Phenergan for breakthrough   GI Bleed Developed acute lower GI bleeding with 1 stool on 1/28 PM and 2 on 1/29 AM STAT CT without obvious source, actually showed improving descending and sigmoid colitis Appears to be likely post-polypectomy bleeding Holding Lovenox, added SCDs Recurrent bleeding on 1/30, stable CBC Will try to continue ASA and Plavix for now unless bleeding increases   ABLA Hgb dropped from 9.4 -> 7.2  -> 6.1 -> 9.9 Patient previously refused transfusion but is now willing (confirmed in the presence of nursing) Transfused 2 units PRBC on 1/29 Stable CBC   Lung nodule CT on 1/29 also showed an enlarging subpleural nodule in the anterior RML that is concerning for lung cancer vs. Met She will need outpatient oncology f/u for PET scan   Hypokalemia QT prolongation Likely in the setting of intractable diarrhea Monitor potassium and replace as needed Recheck EKG (ordered 2 days ago and still not done so it was reordered) Avoid QT-prolonging medications where possible   Hypothyroidism Stable Continue Synthroid Repeat TSH and free T4 in 6 weeks as outpatient   Chronic hypotension Stable Continue midodrine   Coronary disease Stable Continue ASA and Plavix for now but this may need to be held should bleeding persist Continue Imdur, Ranexa   HLD Continue atorvastatin   Chronic systolic and diastolic congestive heart failure  Appeared hypovolemic secondary to intractable diarrhea on admission Continue holding Lasix       Consultants: GI ID   Procedures: EGD 1/27 Colonoscopy 1/27   Antibiotics: Fidoxamicin 1/16-26   30 Day Unplanned Readmission Risk Score    Flowsheet Row ED to Hosp-Admission (Current) from 08/02/2023 in [facility]  30 Day Unplanned Readmission Risk Score (%) 32.36 Filed at 08/17/2023 0401       This score is the patient's risk of an unplanned readmission within 30 days of being discharged (0 -100%). The score is based on dignosis, age, lab data, medications, orders, and past utilization.   Low:  0-14.9   Medium: 15-21.9   High: 22-29.9   Extreme: 30 and above  Subjective: No report of further bloody BMs.  She is nauseated this AM.   Objective: Vitals:   08/17/23 0347 08/17/23 0745  BP: 110/72 112/75  Pulse: 82 79  Resp: 20 18  Temp: 98.5 F (36.9 C) 98.4 F (36.9 C)  SpO2: 100% 100%     Intake/Output Summary (Last 24 hours) at 08/17/2023 1426 Last data filed at 08/17/2023 1100 Gross per 24 hour  Intake 240 ml  Output --  Net 240 ml   Filed Weights   08/15/23 0500 08/16/23 0257 08/17/23 0752  Weight: 64.4 kg 59.1 kg 62.6 kg    Exam:  General:  Appears calm and comfortable and is in NAD, nauseated today Eyes:  EOMI, normal lids, iris ENT:  grossly normal hearing, lips & tongue, mmm; edentulous Neck:  no LAD, masses or thyromegaly Cardiovascular:  RRR, no m/r/g. No LE edema.  Respiratory:   CTA bilaterally with no wheezes/rales/rhonchi.  Normal respiratory effort. Abdomen:  soft, NT, ND Skin:  no rash or induration seen on limited exam Musculoskeletal:  grossly normal tone BUE/BLE, good ROM, no bony abnormality Psychiatric: blunted mood and affect, speech fluent and appropriate, AOx3 Neurologic:  CN 2-12 grossly intact, moves all extremities in coordinated fashion  Data Reviewed: I have reviewed the patient's lab results since admission.  Pertinent labs for today include:   Na++ 128 WBC 7.7 Hgb 10.2, stable     Family Communication: None present  Disposition: Status is: Inpatient Remains inpatient appropriate because: ongoing evaluation and management     Time spent: 50 minutes  Unresulted Labs (From admission, onward)     Start     Ordered   08/18/23 0500  Renal function panel  Tomorrow morning,   R       Question:  Specimen collection method  Answer:  Lab=Lab collect   08/17/23 1338   08/18/23 0500  CBC with Differential/Platelet  Tomorrow morning,   R       Question:  Specimen collection method  Answer:  Lab=Lab collect   08/17/23 1426   08/12/23 1147  Calprotectin, Fecal  Once,   R       Comments: SNR    08/12/23 1147             Author: Jonah Blue, MD 08/17/2023 2:26 PM  For on call review [email or web address].

## 2023-08-17 NOTE — Plan of Care (Signed)

## 2023-08-17 NOTE — Progress Notes (Signed)
Occupational Therapy Treatment Patient Details Name: Abigail Ortega MRN: [id number] DOB: 1950-11-15 Today's Date: 08/17/2023   History of present illness Pt is a 73 y.o. female presenting to hospital 08/02/23 from gastroenterology clinic with c/o hypotension, generalized weakness, and recurrent diarrhea.  Pt admitted with acute colitis, hypokalemia, chronic hypotension, severe hypothyroidism, CAD, and chronic combined systolic and diastolic CHF.  PMH includes C. Difficile colitis, CAD s/p dES, R breast CA s/p chemoradiation and mastectomy, hypothyroidism, htn, HLD, osteoporosis, anemia, CHF, IBS.   OT comments  Pt is supine in bed on arrival. Easily arousable and agreeable to OT session. She denies pain. Pt performed bed mobility with SUP and increased time and effort. Pt required Min A for STS from EOB to RW with cueing for pushing up from EOB. She performed ~10 feet in room mobility using RW with CGA and x1 standing rest break before wishing to return to bed. Pt endorses BLE weakness and just a general sense of not feeling well. Edu on importance of getting OOB daily to maximize her strength. She did perform facewashing seated EOB with set up assist after declining to perform standing at sink while up walking. Pt returned to bed with all needs in place and will cont to require skilled acute OT services to maximize her safety and IND to return to PLOF.       If plan is discharge home, recommend the following:  A little help with walking and/or transfers;Assistance with cooking/housework;Assist for transportation;Help with stairs or ramp for entrance;A lot of help with bathing/dressing/bathroom   Equipment Recommendations  Other (comment) (defer to next venue)    Recommendations for Other Services      Precautions / Restrictions Precautions Precautions: Fall Restrictions Weight Bearing Restrictions Per Provider Order: No       Mobility Bed Mobility Overal bed mobility: Needs  Assistance Bed Mobility: Supine to Sit, Sit to Supine     Supine to sit: Supervision, HOB elevated, Used rails Sit to supine: Supervision, Used rails   General bed mobility comments: SUP for all bed mobility with increased time and effort, HOB elevated    Transfers Overall transfer level: Needs assistance Equipment used: Rolling walker (2 wheels) Transfers: Sit to/from Stand Sit to Stand: Min assist           General transfer comment: Min A for STS from EOB and performed ~10 feet in room mobility using RW with standing rest break before wishing to return to bed     Balance Overall balance assessment: Needs assistance Sitting-balance support: No upper extremity supported, Feet supported Sitting balance-Leahy Scale: Good Sitting balance - Comments: no LOB while seated EOB   Standing balance support: Bilateral upper extremity supported, Reliant on assistive device for balance Standing balance-Leahy Scale: Fair Standing balance comment: CGA and RW use for standing balance during mobility                           ADL either performed or assessed with clinical judgement   ADL Overall ADL's : Needs assistance/impaired     Grooming: Wash/dry face;Sitting;Set up Grooming Details (indicate cue type and reason): set up seated EOB and able to drink water from cup with no issues                                    Extremity/Trunk Assessment  Vision       Perception     Praxis      Cognition Arousal: Alert Behavior During Therapy: WFL for tasks assessed/performed Overall Cognitive Status: Within Functional Limits for tasks assessed                                 General Comments: patient able to follow single step commands without difficulty.        Exercises      Shoulder Instructions       General Comments      Pertinent Vitals/ Pain       Pain Assessment Pain Assessment: No/denies pain Pain  Intervention(s): Monitored during session  Home Living                                          Prior Functioning/Environment              Frequency  Min 1X/week        Progress Toward Goals  OT Goals(current goals can now be found in the care plan section)  Progress towards OT goals: Progressing toward goals  Acute Rehab OT Goals Patient Stated Goal: improve strength/feel better OT Goal Formulation: With patient Time For Goal Achievement: 08/30/23 Potential to Achieve Goals: Good  Plan      Co-evaluation                 AM-PAC OT "6 Clicks" Daily Activity     Outcome Measure   Help from another person eating meals?: None Help from another person taking care of personal grooming?: A Little Help from another person toileting, which includes using toliet, bedpan, or urinal?: A Lot Help from another person bathing (including washing, rinsing, drying)?: A Lot Help from another person to put on and taking off regular upper body clothing?: A Little Help from another person to put on and taking off regular lower body clothing?: A Lot 6 Click Score: 16    End of Session Equipment Utilized During Treatment: Rolling walker (2 wheels)  OT Visit Diagnosis: Other abnormalities of gait and mobility (R26.89);Muscle weakness (generalized) (M62.81)   Activity Tolerance Patient tolerated treatment well   Patient Left with call bell/phone within reach;in bed;with bed alarm set   Nurse Communication          Time: 6045-4098 OT Time Calculation (min): 19 min  Charges: OT General Charges $OT Visit: 1 Visit OT Treatments $Therapeutic Activity: 8-22 mins  Genae Strine, OTR/L  08/17/23, 3:20 PM   Amiel Mccaffrey E Jeilani Grupe 08/17/2023, 3:18 PM

## 2023-08-17 NOTE — TOC Progression Note (Addendum)
Transition of Care [facility]) - Progression Note    Patient Details  Name: BELLAMY RUBEY MRN: [id number] Date of Birth: 1950-11-23  Transition of Care [facility]) CM/SW Contact  Lorri Frederick, LCSW Phone Number: 08/17/2023, 10:40 AM  Clinical Narrative:   SNF auth request remains pending in [place].   12:00: [facility] still pending  1230: [facility].  No bed available until Monday.   Expected Discharge Plan: Skilled Nursing Facility Barriers to Discharge: Continued Medical Work up  Expected Discharge Plan and Services       Living arrangements for the past 2 months: Single Family Home                                       Social Determinants of Health (SDOH) Interventions SDOH Screenings   Food Insecurity: No Food Insecurity (08/03/2023)  Housing: Low Risk  (08/03/2023)  Transportation Needs: No Transportation Needs (08/03/2023)  Utilities: Not At Risk (08/03/2023)  Social Connections: Moderately Integrated (08/03/2023)  Tobacco Use: Medium Risk (08/02/2023)    Readmission Risk Interventions     No data to display

## 2023-08-18 DIAGNOSIS — A0472 Enterocolitis due to Clostridium difficile, not specified as recurrent: Secondary | ICD-10-CM | POA: Diagnosis not present

## 2023-08-18 LAB — CBC WITH DIFFERENTIAL/PLATELET
Abs Immature Granulocytes: 0.05 10*3/uL (ref 0.00–0.07)
Basophils Absolute: 0.1 10*3/uL (ref 0.0–0.1)
Basophils Relative: 1 %
Eosinophils Absolute: 0.1 10*3/uL (ref 0.0–0.5)
Eosinophils Relative: 1 %
HCT: 26.3 % — ABNORMAL LOW (ref 36.0–46.0)
Hemoglobin: 9.4 g/dL — ABNORMAL LOW (ref 12.0–15.0)
Immature Granulocytes: 1 %
Lymphocytes Relative: 35 %
Lymphs Abs: 2.4 10*3/uL (ref 0.7–4.0)
MCH: 31.4 pg (ref 26.0–34.0)
MCHC: 35.7 g/dL (ref 30.0–36.0)
MCV: 88 fL (ref 80.0–100.0)
Monocytes Absolute: 0.5 10*3/uL (ref 0.1–1.0)
Monocytes Relative: 7 %
Neutro Abs: 3.9 10*3/uL (ref 1.7–7.7)
Neutrophils Relative %: 55 %
Platelets: 334 10*3/uL (ref 150–400)
RBC: 2.99 MIL/uL — ABNORMAL LOW (ref 3.87–5.11)
RDW: 16.2 % — ABNORMAL HIGH (ref 11.5–15.5)
WBC: 7.1 10*3/uL (ref 4.0–10.5)
nRBC: 0 % (ref 0.0–0.2)

## 2023-08-18 LAB — RENAL FUNCTION PANEL
Albumin: 2.5 g/dL — ABNORMAL LOW (ref 3.5–5.0)
Anion gap: 8 (ref 5–15)
BUN: 17 mg/dL (ref 8–23)
CO2: 22 mmol/L (ref 22–32)
Calcium: 8.2 mg/dL — ABNORMAL LOW (ref 8.9–10.3)
Chloride: 99 mmol/L (ref 98–111)
Creatinine, Ser: 0.93 mg/dL (ref 0.44–1.00)
GFR, Estimated: >60 mL/min (ref >60)
Glucose, Bld: 85 mg/dL (ref 70–99)
Phosphorus: 2.8 mg/dL (ref 2.5–4.6)
Potassium: 3.8 mmol/L (ref 3.5–5.1)
Sodium: 129 mmol/L — ABNORMAL LOW (ref 135–145)

## 2023-08-18 NOTE — Progress Notes (Signed)
PHARMACY CONSULT NOTE - FOLLOW UP  Pharmacy Consult for Electrolyte Monitoring and Replacement   Recent Labs: Potassium (mmol/L)  Date Value  08/18/2023 3.8  03/20/2014 3.4 (L)   Magnesium (mg/dL)  Date Value  78/29/5621 1.9  02/23/2014 1.7 (L)   Calcium (mg/dL)  Date Value  30/86/5784 8.2 (L)   Calcium, Total (mg/dL)  Date Value  69/62/9528 9.6   Albumin (g/dL)  Date Value  41/32/4401 2.5 (L)  11/24/2022 4.3  03/20/2014 3.4   Phosphorus (mg/dL)  Date Value  02/72/5366 2.8   Sodium (mmol/L)  Date Value  08/18/2023 129 (L)  11/24/2022 137  03/20/2014 135 (L)    Diet: reg Fluids: none Pertinent Medications: completed fidaxomicin (Cdiff diarrhea),   Assessment: PM is a 73 yo female who presented for follow-up due to hx of C. Difficile. They have had severe diarrhea, weight-loss and weakness. They arrived hypotensive and confused today. Pharmacy has been consulted to manage this patient's electrolytes.  1/29: Infectious diarrhea essentially ruled out at this point. Stool bulking and antidiarrheal added. S/p colonoscopy w/ biopsies   Goal of Therapy:  Electrolytes WNL   Plan:  Electrolytes are stable. No replacement needed. Pharmacy will sign off. Please re-consult if needed.   Ronnald Ramp, PharmD 08/18/2023 7:59 AM

## 2023-08-18 NOTE — TOC Progression Note (Signed)
Transition of Care [facility]) - Progression Note    Patient Details  Name: Abigail Ortega MRN: [id number] Date of Birth: 21-Jun-1951  Transition of Care [facility]) CM/SW Contact  Liliana Cline, LCSW Phone Number: 08/18/2023, 8:44 AM  Clinical Narrative:    [facility] pending in [facility] portal.  Expected Discharge Plan: Skilled Nursing Facility Barriers to Discharge: Continued Medical Work up  Expected Discharge Plan and Services       Living arrangements for the past 2 months: Single Family Home                                       Social Determinants of Health (SDOH) Interventions SDOH Screenings   Food Insecurity: No Food Insecurity (08/03/2023)  Housing: Low Risk  (08/03/2023)  Transportation Needs: No Transportation Needs (08/03/2023)  Utilities: Not At Risk (08/03/2023)  Social Connections: Moderately Integrated (08/03/2023)  Tobacco Use: Medium Risk (08/02/2023)    Readmission Risk Interventions     No data to display

## 2023-08-18 NOTE — Plan of Care (Signed)
  Problem: Clinical Measurements: Goal: Ability to maintain clinical measurements within normal limits will improve Outcome: Progressing   Problem: Clinical Measurements: Goal: Will remain free from infection Outcome: Progressing   Problem: Clinical Measurements: Goal: Diagnostic test results will improve Outcome: Progressing   Problem: Clinical Measurements: Goal: Respiratory complications will improve Outcome: Progressing   Problem: Nutrition: Goal: Adequate nutrition will be maintained Outcome: Progressing   Problem: Coping: Goal: Level of anxiety will decrease Outcome: Progressing   Problem: Elimination: Goal: Will not experience complications related to bowel motility Outcome: Progressing

## 2023-08-18 NOTE — Progress Notes (Signed)
Progress Note   Patient: Abigail Ortega NGE:[id number] DOB: 01/26/1951 DOA: 08/02/2023     15 DOS: the patient was seen and examined on 08/18/2023   Brief hospital course: 73yo with h/o CAD s/p DES, breast CA s/p chemorads and mastectomy, hypothyroidism, HTN, HLD, and recent C. difficile colitis s/p treatment with vancomycin (December 2024)  who presented on 1/16 with persistent diarrhea and abdominal pain, concerning for treatment failure vs. Recurrence.  C diff PCR and antigen +, toxin -.  GI/ID consulted.  1/21 CT with mild-moderate colitis.  1/27 colonoscopy with diverticulosis, normal biopsies.  1/27 EGD with medium-sized hiatal hernia, grade C esophagitis.  GI suspects postinfectious IBS and recommends anti-diarrheal medication with outpatient GI follow-up.  Assessment and Plan:  Postinfectious IBS Recent C diff infection, returned with persistent diarrhea C. difficile PCR positive but toxin was negative Patient completed 10 days of oral vancomycin and still had diarrhea Started on Dificid Infectious disease consulted 1/21 CT abdomen pelvis with mild to moderate colitis without evidence of abscess or perforation GI consulted - EGD and flex sig 1/27, biopsies unrevealing Flexi-Seal removed 1/22 Infectious diarrhea essentially ruled out at this point Started on stool bulking with fiber and antidiarrheal medication (as needed Imodium) Will need outpatient follow-up with GI postdischarge (Dr. Tobi Bastos) Started with n/v on 1/31, resolved on 2/1   GI Bleed Developed acute lower GI bleeding with 1 stool on 1/28 PM and 2 on 1/29 AM STAT CT without obvious source, actually showed improving descending and sigmoid colitis Appears to be likely post-polypectomy bleeding Holding Lovenox, added SCDs Recurrent bleeding on 1/30, stable CBC Will try to continue ASA and Plavix for now unless bleeding increases   ABLA Hgb dropped from 9.4 -> 7.2 -> 6.1 -> 9.9 Patient previously refused transfusion  but is now willing (confirmed in the presence of nursing) Transfused 2 units PRBC on 1/29 Stable CBC   Lung nodule CT on 1/29 also showed an enlarging subpleural nodule in the anterior RML that is concerning for lung cancer vs. Met She will need outpatient oncology f/u for PET scan   Hypokalemia QT prolongation Likely in the setting of intractable diarrhea Monitor potassium and replace as needed Recheck EKG (ordered 2 days ago and still not done so it was reordered) Avoid QT-prolonging medications where possible   Hypothyroidism Stable Continue Synthroid Repeat TSH and free T4 in 6 weeks as outpatient   Chronic hypotension Stable Continue midodrine   Coronary disease Stable Continue ASA and Plavix for now but this may need to be held should bleeding persist Continue Imdur, Ranexa   HLD Continue atorvastatin   Chronic systolic and diastolic congestive heart failure  Appeared hypovolemic secondary to intractable diarrhea on admission Continue holding Lasix       Consultants: GI ID   Procedures: EGD 1/27 Colonoscopy 1/27   Antibiotics: Fidoxamicin 1/16-26    30 Day Unplanned Readmission Risk Score    Flowsheet Row ED to Hosp-Admission (Current) from [facility]02/2023 in [facility]  30 Day Unplanned Readmission Risk Score (%) 32.75 Filed at 08/18/2023 0401       This score is the patient's risk of an unplanned readmission within 30 days of being discharged (0 -100%). The score is based on dignosis, age, lab data, medications, orders, and past utilization.   Low:  0-14.9   Medium: 15-21.9   High: 22-29.9   Extreme: 30 and above           Subjective: Feeling better today,  no further n/v and tolerating PO.  No complaints.   Objective: Vitals:   08/18/23 0752 08/18/23 1449  BP: 101/71 (!) 91/59  Pulse: (!) 101 81  Resp: 18 18  Temp: 97.9 F (36.6 C) 98.4 F (36.9 C)  SpO2: 100% 100%    Intake/Output Summary (Last  24 hours) at 08/18/2023 1611 Last data filed at 08/18/2023 1300 Gross per 24 hour  Intake 290 ml  Output --  Net 290 ml   Filed Weights   08/16/23 0257 08/17/23 0752 08/18/23 0455  Weight: 59.1 kg 62.6 kg 60.2 kg    Exam:  General:  Appears calm and comfortable and is in NAD, nauseated today Eyes:  EOMI, normal lids, iris ENT:  grossly normal hearing, lips & tongue, mmm; edentulous Neck:  no LAD, masses or thyromegaly Cardiovascular:  RRR, no m/r/g. No LE edema.  Respiratory:   CTA bilaterally with no wheezes/rales/rhonchi.  Normal respiratory effort. Abdomen:  soft, NT, ND Skin:  no rash or induration seen on limited exam Musculoskeletal:  grossly normal tone BUE/BLE, good ROM, no bony abnormality Psychiatric: blunted mood and affect, speech fluent and appropriate, AOx3 Neurologic:  CN 2-12 grossly intact, moves all extremities in coordinated fashion  Data Reviewed: I have reviewed the patient's lab results since admission.  Pertinent labs for today include:   Na++ 129 Albumin 2.5 WBC 7.1 Hgb 9.4, stable     Family Communication: None present  Disposition: Status is: Inpatient Remains inpatient appropriate because: needs SNF rehab, likely Monday to [facility]     Time spent: 35 minutes  Unresulted Labs (From admission, onward)     Start     Ordered   08/12/23 1147  Calprotectin, Fecal  Once,   R       Comments: SNR    08/12/23 1147             Author: Jonah Blue, MD 08/18/2023 4:11 PM  For on call review [email or web address].

## 2023-08-19 DIAGNOSIS — R197 Diarrhea, unspecified: Secondary | ICD-10-CM | POA: Diagnosis not present

## 2023-08-19 NOTE — Progress Notes (Signed)
Progress Note   Patient: Abigail Ortega:[id number] DOB: 20-Nov-1950 DOA: 08/02/2023     16 DOS: the patient was seen and examined on 08/19/2023   Brief hospital course: 72yo with h/o CAD s/p DES, breast CA s/p chemorads and mastectomy, hypothyroidism, HTN, HLD, and recent C. difficile colitis s/p treatment with vancomycin (December 2024)  who presented on 1/16 with persistent diarrhea and abdominal pain, concerning for treatment failure vs. Recurrence.  C diff PCR and antigen +, toxin -.  GI/ID consulted.  1/21 CT with mild-moderate colitis.  1/27 colonoscopy with diverticulosis, normal biopsies.  1/27 EGD with medium-sized hiatal hernia, grade C esophagitis.  GI suspects postinfectious IBS and recommends anti-diarrheal medication with outpatient GI follow-up.  Assessment and Plan:  Postinfectious IBS Recent C diff infection, returned with persistent diarrhea C. difficile PCR positive but toxin was negative Patient completed 10 days of oral vancomycin and still had diarrhea Started on Dificid Infectious disease consulted 1/21 CT abdomen pelvis with mild to moderate colitis without evidence of abscess or perforation GI consulted - EGD and flex sig 1/27, biopsies unrevealing Flexi-Seal removed 1/22 Infectious diarrhea essentially ruled out at this point Started on stool bulking with fiber and antidiarrheal medication (as needed Imodium) Will need outpatient follow-up with GI postdischarge (Dr. Tobi Bastos) Started with n/v on 1/31, resolved on 2/1   GI Bleed Developed acute lower GI bleeding with 1 stool on 1/28 PM and 2 on 1/29 AM STAT CT without obvious source, actually showed improving descending and sigmoid colitis Appears to be likely post-polypectomy bleeding Holding Lovenox, added SCDs Recurrent bleeding on 1/30, stable CBC Will try to continue ASA and Plavix for now unless bleeding increases   ABLA Hgb dropped from 9.4 -> 7.2 -> 6.1 -> 9.9 Patient previously refused transfusion  but is now willing (confirmed in the presence of nursing) Transfused 2 units PRBC on 1/29 Stable CBC   Lung nodule CT on 1/29 also showed an enlarging subpleural nodule in the anterior RML that is concerning for lung cancer vs. Met She will need outpatient oncology f/u for PET scan   Hypokalemia QT prolongation Likely in the setting of intractable diarrhea Monitor potassium and replace as needed Recheck EKG (ordered 2 days ago and still not done so it was reordered) Avoid QT-prolonging medications where possible   Hypothyroidism Stable Continue Synthroid Repeat TSH and free T4 in 6 weeks as outpatient   Chronic hypotension Stable Continue midodrine   Coronary disease Stable Continue ASA and Plavix for now but this may need to be held should bleeding persist Continue Imdur, Ranexa   HLD Continue atorvastatin   Chronic systolic and diastolic congestive heart failure  Appeared hypovolemic secondary to intractable diarrhea on admission Continue holding Lasix       Consultants: GI ID   Procedures: EGD 1/27 Colonoscopy 1/27   Antibiotics: Fidoxamicin 1/16-26   30 Day Unplanned Readmission Risk Score    Flowsheet Row ED to Hosp-Admission (Current) from 08/02/2023 in [facility]  30 Day Unplanned Readmission Risk Score (%) 33.13 Filed at 08/19/2023 0401       This score is the patient's risk of an unplanned readmission within 30 days of being discharged (0 -100%). The score is based on dignosis, age, lab data, medications, orders, and past utilization.   Low:  0-14.9   Medium: 15-21.9   High: 22-29.9   Extreme: 30 and above           Subjective: Grumbling in her stomach  without further bleeding.   Objective: Vitals:   08/18/23 1926 08/19/23 0252  BP: 109/64 109/68  Pulse: 80 90  Resp: 18 17  Temp: 98.2 F (36.8 C) 98.4 F (36.9 C)  SpO2: 100% 100%   No intake or output data in the 24 hours ending 08/19/23  1311  Filed Weights   08/16/23 0257 08/17/23 0752 08/18/23 0455  Weight: 59.1 kg 62.6 kg 60.2 kg    Exam:  General:  Appears calm and comfortable and is in NAD Eyes:  EOMI, normal lids, iris ENT:  grossly normal hearing, lips & tongue, mmm; edentulous Neck:  no LAD, masses or thyromegaly Cardiovascular:  RRR, no m/r/g. No LE edema.  Respiratory:   CTA bilaterally with no wheezes/rales/rhonchi.  Normal respiratory effort. Abdomen:  soft, NT, ND Skin:  no rash or induration seen on limited exam Musculoskeletal:  grossly normal tone BUE/BLE, good ROM, no bony abnormality Psychiatric: blunted mood and affect, speech fluent and appropriate, AOx3 Neurologic:  CN 2-12 grossly intact, moves all extremities in coordinated fashion  Data Reviewed: I have reviewed the patient's lab results since admission.  Pertinent labs for today include:   Na++ 129 - stable Albumin 2.5 WBC 7.1 Hgb 9.4 - stable     Family Communication: None present; I spoke with her sister by telephone  Disposition: Status is: Inpatient Remains inpatient appropriate because: awaiting placement     Time spent: 35 minutes  Unresulted Labs (From admission, onward)     Start     Ordered   08/20/23 0500  CBC with Differential/Platelet  Tomorrow morning,   R       Question:  Specimen collection method  Answer:  Lab=Lab collect   08/19/23 1311   08/20/23 0500  Basic metabolic panel  Tomorrow morning,   R       Question:  Specimen collection method  Answer:  Lab=Lab collect   08/19/23 1311             Author: Jonah Blue, MD 08/19/2023 1:11 PM  For on call review [email or web address].

## 2023-08-19 NOTE — Plan of Care (Signed)

## 2023-08-19 NOTE — Plan of Care (Signed)
  Problem: Education: Goal: Knowledge of General Education information will improve Description: Including pain rating scale, medication(s)/side effects and non-pharmacologic comfort measures Outcome: Progressing   Problem: Health Behavior/Discharge Planning: Goal: Ability to manage health-related needs will improve Outcome: Progressing   Problem: Clinical Measurements: Goal: Ability to maintain clinical measurements within normal limits will improve Outcome: Progressing Goal: Respiratory complications will improve Outcome: Progressing Goal: Cardiovascular complication will be avoided Outcome: Progressing   Problem: Activity: Goal: Risk for activity intolerance will decrease Outcome: Progressing   Problem: Pain Managment: Goal: General experience of comfort will improve and/or be controlled Outcome: Progressing   Problem: Skin Integrity: Goal: Risk for impaired skin integrity will decrease Outcome: Progressing

## 2023-08-19 NOTE — TOC Progression Note (Signed)
Transition of Care [facility]) - Progression Note    Patient Details  Name: Abigail Ortega MRN: [id number] Date of Birth: 07-14-1951  [facility]) CM/SW Contact  Liliana Cline, LCSW Phone Number: 08/19/2023, 8:33 AM  Clinical Narrative:    [facility] pending in [employer] portal.    Expected Discharge Plan: Skilled Nursing Facility Barriers to Discharge: Continued Medical Work up  Expected Discharge Plan and Services       Living arrangements for the past 2 months: Single Family Home                                       Social Determinants of Health (SDOH) Interventions SDOH Screenings   Food Insecurity: No Food Insecurity (08/03/2023)  Housing: Low Risk  (08/03/2023)  Transportation Needs: No Transportation Needs (08/03/2023)  Utilities: Not At Risk (08/03/2023)  Social Connections: Moderately Integrated (08/03/2023)  Tobacco Use: Medium Risk (08/02/2023)    Readmission Risk Interventions     No data to display

## 2023-08-20 DIAGNOSIS — R911 Solitary pulmonary nodule: Secondary | ICD-10-CM | POA: Diagnosis not present

## 2023-08-20 DIAGNOSIS — K591 Functional diarrhea: Secondary | ICD-10-CM | POA: Diagnosis not present

## 2023-08-20 DIAGNOSIS — I9589 Other hypotension: Secondary | ICD-10-CM | POA: Diagnosis not present

## 2023-08-20 DIAGNOSIS — C50311 Malignant neoplasm of lower-inner quadrant of right female breast: Secondary | ICD-10-CM | POA: Diagnosis not present

## 2023-08-20 DIAGNOSIS — Z8619 Personal history of other infectious and parasitic diseases: Secondary | ICD-10-CM | POA: Diagnosis not present

## 2023-08-20 DIAGNOSIS — I509 Heart failure, unspecified: Secondary | ICD-10-CM | POA: Diagnosis not present

## 2023-08-20 DIAGNOSIS — K580 Irritable bowel syndrome with diarrhea: Secondary | ICD-10-CM | POA: Diagnosis not present

## 2023-08-20 DIAGNOSIS — K929 Disease of digestive system, unspecified: Secondary | ICD-10-CM | POA: Diagnosis not present

## 2023-08-20 DIAGNOSIS — E785 Hyperlipidemia, unspecified: Secondary | ICD-10-CM | POA: Diagnosis not present

## 2023-08-20 DIAGNOSIS — E440 Moderate protein-calorie malnutrition: Secondary | ICD-10-CM | POA: Diagnosis not present

## 2023-08-20 DIAGNOSIS — E039 Hypothyroidism, unspecified: Secondary | ICD-10-CM | POA: Diagnosis not present

## 2023-08-20 DIAGNOSIS — I2510 Atherosclerotic heart disease of native coronary artery without angina pectoris: Secondary | ICD-10-CM | POA: Diagnosis not present

## 2023-08-20 DIAGNOSIS — K5732 Diverticulitis of large intestine without perforation or abscess without bleeding: Secondary | ICD-10-CM | POA: Diagnosis not present

## 2023-08-20 DIAGNOSIS — K5900 Constipation, unspecified: Secondary | ICD-10-CM | POA: Diagnosis not present

## 2023-08-20 DIAGNOSIS — K449 Diaphragmatic hernia without obstruction or gangrene: Secondary | ICD-10-CM | POA: Diagnosis not present

## 2023-08-20 DIAGNOSIS — Z7401 Bed confinement status: Secondary | ICD-10-CM | POA: Diagnosis not present

## 2023-08-20 DIAGNOSIS — K209 Esophagitis, unspecified without bleeding: Secondary | ICD-10-CM | POA: Diagnosis not present

## 2023-08-20 DIAGNOSIS — D62 Acute posthemorrhagic anemia: Secondary | ICD-10-CM | POA: Diagnosis not present

## 2023-08-20 DIAGNOSIS — R531 Weakness: Secondary | ICD-10-CM | POA: Diagnosis not present

## 2023-08-20 DIAGNOSIS — K2100 Gastro-esophageal reflux disease with esophagitis, without bleeding: Secondary | ICD-10-CM | POA: Diagnosis not present

## 2023-08-20 DIAGNOSIS — D509 Iron deficiency anemia, unspecified: Secondary | ICD-10-CM | POA: Diagnosis not present

## 2023-08-20 DIAGNOSIS — J449 Chronic obstructive pulmonary disease, unspecified: Secondary | ICD-10-CM | POA: Diagnosis not present

## 2023-08-20 LAB — CBC WITH DIFFERENTIAL/PLATELET
Abs Immature Granulocytes: 0.02 10*3/uL (ref 0.00–0.07)
Basophils Absolute: 0.1 10*3/uL (ref 0.0–0.1)
Basophils Relative: 1 %
Eosinophils Absolute: 0.1 10*3/uL (ref 0.0–0.5)
Eosinophils Relative: 2 %
HCT: 24.8 % — ABNORMAL LOW (ref 36.0–46.0)
Hemoglobin: 8.7 g/dL — ABNORMAL LOW (ref 12.0–15.0)
Immature Granulocytes: 0 %
Lymphocytes Relative: 37 %
Lymphs Abs: 2.1 10*3/uL (ref 0.7–4.0)
MCH: 31.3 pg (ref 26.0–34.0)
MCHC: 35.1 g/dL (ref 30.0–36.0)
MCV: 89.2 fL (ref 80.0–100.0)
Monocytes Absolute: 0.4 10*3/uL (ref 0.1–1.0)
Monocytes Relative: 7 %
Neutro Abs: 3 10*3/uL (ref 1.7–7.7)
Neutrophils Relative %: 53 %
Platelets: 358 10*3/uL (ref 150–400)
RBC: 2.78 MIL/uL — ABNORMAL LOW (ref 3.87–5.11)
RDW: 16.1 % — ABNORMAL HIGH (ref 11.5–15.5)
WBC: 5.7 10*3/uL (ref 4.0–10.5)
nRBC: 0 % (ref 0.0–0.2)

## 2023-08-20 LAB — BASIC METABOLIC PANEL
Anion gap: 11 (ref 5–15)
BUN: 15 mg/dL (ref 8–23)
CO2: 23 mmol/L (ref 22–32)
Calcium: 8.3 mg/dL — ABNORMAL LOW (ref 8.9–10.3)
Chloride: 97 mmol/L — ABNORMAL LOW (ref 98–111)
Creatinine, Ser: 1.01 mg/dL — ABNORMAL HIGH (ref 0.44–1.00)
GFR, Estimated: 59 mL/min — ABNORMAL LOW (ref >60)
Glucose, Bld: 82 mg/dL (ref 70–99)
Potassium: 3.5 mmol/L (ref 3.5–5.1)
Sodium: 131 mmol/L — ABNORMAL LOW (ref 135–145)

## 2023-08-20 MED ORDER — GERHARDT'S BUTT CREAM: 1.0000 | TOPICAL_CREAM | Freq: Four times a day (QID) | CUTANEOUS | Status: DC

## 2023-08-20 MED ORDER — LOPERAMIDE HCL 2 MG PO CAPS: 2.0000 mg | ORAL_CAPSULE | ORAL | Status: DC | PRN

## 2023-08-20 MED ORDER — CALCIUM POLYCARBOPHIL 625 MG PO TABS: 625.0000 mg | ORAL_TABLET | Freq: Every day | ORAL | Status: AC

## 2023-08-20 MED ORDER — FOLIC ACID 1 MG PO TABS: 1.0000 mg | ORAL_TABLET | Freq: Every day | ORAL | Status: AC

## 2023-08-20 NOTE — Progress Notes (Signed)
IV dc/d. Patient transported by EMS to [facility]. Personal belonging taken by sister. Pt has glasses on and her cell phone. No concerns voiced. Patient called sister while I was in room and told her she was being picked by transport.

## 2023-08-20 NOTE — Discharge Summary (Signed)
Physician Discharge Summary   Patient: Abigail Ortega MRN: [id number] DOB: [birth year]  Admit date:     08/02/2023  Discharge date: 08/20/23  Discharge Physician: Jonah Blue   PCP: Margaretann Loveless, MD   Recommendations at discharge:   You are being discharged to [facility] for rehabilitation Continue stool bulking with fibercon and Imodium as needed Follow up with GI (Dr. Tobi Bastos) Follow up with PCP after discharge from rehab  Discharge Diagnoses: Principal Problem:   Diarrhea Active Problems:   Hypokalemia   Chronic combined systolic and diastolic CHF (congestive heart failure) (HCC)   CAD (coronary artery disease)   Chronic hypotension   Severe hypothyroidism   Folate deficiency   Normocytic anemia   Hypomagnesemia   Loss of weight    Hospital Course: 72yo with h/o CAD s/p DES, breast CA s/p chemorads and mastectomy, hypothyroidism, HTN, HLD, and recent C. difficile colitis s/p treatment with vancomycin (December 2024)  who presented on 1/16 with persistent diarrhea and abdominal pain, concerning for treatment failure vs. Recurrence.  C diff PCR and antigen +, toxin -.  GI/ID consulted.  1/21 CT with mild-moderate colitis.  1/27 colonoscopy with diverticulosis, normal biopsies.  1/27 EGD with medium-sized hiatal hernia, grade C esophagitis.  GI suspects postinfectious IBS and recommends anti-diarrheal medication with outpatient GI follow-up.  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 difficile PCR positive but toxin was negative Patient completed 10 days of oral vancomycin and still had diarrhea Started on Dificid Infectious disease consulted 1/21 CT abdomen pelvis with mild to moderate colitis without evidence of abscess or perforation GI consulted - EGD and flex sig 1/27, biopsies unrevealing Flexi-Seal removed 1/22 Infectious diarrhea essentially ruled out at this point Started on  stool bulking with fiber and antidiarrheal medication (as needed Imodium) Will need outpatient follow-up with GI postdischarge (Dr. Tobi Bastos) Started with n/v on 1/31, resolved on 2/1   GI Bleed Developed acute lower GI bleeding with 1 stool on 1/28 PM and 2 on 1/29 AM STAT CT without obvious source, actually showed improving descending and sigmoid colitis Appears to be likely post-polypectomy bleeding Holding Lovenox, added SCDs Recurrent bleeding on 1/30, stable CBC Resumed ASA and Plavix without recurrence   ABLA Hgb dropped from 9.4 -> 7.2 -> 6.1 -> 9.9 Patient previously refused transfusion but is now willing (confirmed in the presence of nursing) Transfused 2 units PRBC on 1/29 Stable CBC   Lung nodule CT on 1/29 also showed an enlarging subpleural nodule in the anterior RML that is concerning for lung cancer vs. Met She will need outpatient oncology f/u for PET scan   Hypokalemia QT prolongation Likely in the setting of intractable diarrhea Hypokalemia has resolved Avoid QT-prolonging medications where possible   Hypothyroidism Stable Continue Synthroid Repeat TSH and free T4 in 6 weeks as outpatient   Chronic hypotension Stable Continue midodrine   Coronary disease Stable Continue ASA and Plavix for now but this may need to be held should bleeding persist Continue Imdur, Ranexa   HLD Continue atorvastatin   Chronic systolic and diastolic congestive heart failure  Appeared hypovolemic secondary to intractable diarrhea on admission Continue holding Lasix       Consultants: GI ID   Procedures: EGD 1/27 Colonoscopy 1/27   Antibiotics: Fidoxamicin 1/16-26    Pain control - [facility] Controlled Substance Reporting System database was reviewed. and patient was instructed, not to drive, operate heavy machinery, perform activities  at heights, swimming or participation in water activities or provide baby-sitting services while on Pain, Sleep and Anxiety  Medications; until their outpatient Physician has advised to do so again. Also recommended to not to take more than prescribed Pain, Sleep and Anxiety Medications.    Disposition: Skilled nursing facility Diet recommendation:  Regular diet DISCHARGE MEDICATION: Allergies as of 08/20/2023       Reactions   Nausea Control [emetrol] Nausea And Vomiting   Other    Note: burning, nausea with XR 75 mg   Effexor [venlafaxine] Hives, Itching, Rash   Hydroxyzine Rash, Hives        Medication List     TAKE these medications    acetaminophen 650 MG CR tablet Commonly known as: TYLENOL Take 1,300 mg by mouth every 8 (eight) hours as needed for pain.   aspirin EC 81 MG tablet Take 81 mg by mouth daily.   atorvastatin 80 MG tablet Commonly known as: LIPITOR TAKE 1 TABLET EVERY DAY   clopidogrel 75 MG tablet Commonly known as: PLAVIX TAKE 1 TABLET EVERY DAY (NEED MD APPOINTMENT)   dicyclomine 10 MG capsule Commonly known as: BENTYL TAKE 1 CAPSULE EVERY 8 HOURS FOR ABDOMINAL PAIN   ferrous sulfate 325 (65 FE) MG tablet Take 1 tablet (325 mg total) by mouth daily.   fexofenadine 60 MG tablet Commonly known as: ALLEGRA Take 0.5 tablets (30 mg total) by mouth 2 (two) times daily as needed (allergies.).   folic acid 1 MG tablet Commonly known as: FOLVITE Take 1 tablet (1 mg total) by mouth daily. Start taking on: August 21, 2023   furosemide 20 MG tablet Commonly known as: LASIX Take 1 tablet (20 mg total) by mouth daily as needed.   Gerhardt's butt cream Crea Apply 1 Application topically 4 (four) times daily.   hydrocortisone 2.5 % cream APPLY PEA SIZED TO RECTAL REGION EVERY 8 HOURS AS NEEDED FOR RELIEF   isosorbide mononitrate 120 MG 24 hr tablet Commonly known as: IMDUR TAKE 1 TABLET TWICE DAILY   levothyroxine 88 MCG tablet Commonly known as: SYNTHROID Take 1 tablet (88 mcg total) by mouth daily before breakfast.   lidocaine 5 % Commonly known as:  Lidoderm Place 1 patch onto the skin every 12 (twelve) hours. Remove & Discard patch within 12 hours or as directed by MD   loperamide 2 MG capsule Commonly known as: IMODIUM Take 1 capsule (2 mg total) by mouth as needed for diarrhea or loose stools.   midodrine 10 MG tablet Commonly known as: PROAMATINE Take 10 mg by mouth 3 (three) times daily.   montelukast 10 MG tablet Commonly known as: SINGULAIR TAKE 1 TABLET AT BEDTIME   ondansetron 4 MG disintegrating tablet Commonly known as: ZOFRAN-ODT Take 1 tablet (4 mg total) by mouth every 6 (six) hours as needed for nausea or vomiting.   pantoprazole 40 MG tablet Commonly known as: Protonix Take 1 tablet (40 mg total) by mouth daily.   polycarbophil 625 MG tablet Commonly known as: FIBERCON Take 1 tablet (625 mg total) by mouth daily. Start taking on: August 21, 2023   ranolazine 500 MG 12 hr tablet Commonly known as: RANEXA TAKE 1 TABLET TWICE DAILY   traZODone 100 MG tablet Commonly known as: DESYREL Take 1 tablet (100 mg total) by mouth at bedtime. TAKE 1 TABLET BY MOUTH NIGHTLY AT BEDTIME AS NEEDED FOR SLEEP        Contact information for after-discharge care     Destination  [facility] Preferred SNF .   Service: Skilled Nursing Contact information: [address] [place] [address] [phone]                    Discharge Exam:    Subjective: Feeling better.  Mild intermittent nausea that is improved with PO Zofran.  No new concerns.   Objective: Vitals:   08/20/23 0353 08/20/23 0721  BP: 120/78 113/82  Pulse: 82 86  Resp: 18 16  Temp: 98.6 F (37 C) 98.5 F (36.9 C)  SpO2: 100% 100%    Intake/Output Summary (Last 24 hours) at 08/20/2023 1401 Last data filed at 08/19/2023 1900 Gross per 24 hour  Intake 0 ml  Output --  Net 0 ml   Filed Weights   08/17/23 0752 08/18/23 0455 08/20/23  0356  Weight: 62.6 kg 60.2 kg 60.2 kg    Exam:  General:  Appears calm and comfortable and is in NAD Eyes:  EOMI, normal lids, iris ENT:  grossly normal hearing, lips & tongue, mmm; edentulous Neck:  no LAD, masses or thyromegaly Cardiovascular:  RRR, no m/r/g. No LE edema.  Respiratory:   CTA bilaterally with no wheezes/rales/rhonchi.  Normal respiratory effort. Abdomen:  soft, NT, ND Skin:  no rash or induration seen on limited exam Musculoskeletal:  grossly normal tone BUE/BLE, good ROM, no bony abnormality Psychiatric: blunted mood and affect, speech fluent and appropriate, AOx3 Neurologic:  CN 2-12 grossly intact, moves all extremities in coordinated fashion  Data Reviewed: I have reviewed the patient's lab results since admission.  Pertinent labs for today include:   Na++ 131, stable BUN 15/Creatinine 1.01/GFR 59, stable Hgb 8.7    Condition at discharge: stable  The results of significant diagnostics from this hospitalization (including imaging, microbiology, ancillary and laboratory) are listed below for reference.   Imaging Studies: CT Angio Abd/Pel w/ and/or w/o Result Date: 08/15/2023 CLINICAL DATA:  Lower GI bleed EXAM: CTA ABDOMEN AND PELVIS WITHOUT AND WITH CONTRAST TECHNIQUE: Multidetector CT imaging of the abdomen and pelvis was performed using the standard protocol during bolus administration of intravenous contrast. Multiplanar reconstructed images and MIPs were obtained and reviewed to evaluate the vascular anatomy. RADIATION DOSE REDUCTION: This exam was performed according to the departmental dose-optimization program which includes automated exposure control, adjustment of the mA and/or kV according to patient size and/or use of iterative reconstruction technique. CONTRAST:  OMNIPAQUE IOHEXOL 350 MG/ML SOLN COMPARISON:  Recent prior CT scan of the abdomen and pelvis 08/07/2023 FINDINGS: VASCULAR Aorta: Atherosclerotic plaque throughout the abdominal aorta.  No evidence of aneurysm or dissection. Celiac: Mild stenosis of the origin of the celiac axis due to fibrofatty atherosclerotic plaque. Lateral segmental branch of the left hepatic artery is replaced to the left gastric artery. No evidence of dissection or aneurysm. SMA: Mild to moderate stenosis of the proximal SMA due to fibrofatty atherosclerotic plaque. Mild poststenotic dilatation without aneurysm. No dissection. Renals: Single renal arteries bilaterally. Mild stenosis on the left due to fibrofatty atherosclerotic plaque. No significant stenosis on the right. 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 Inflow: Patent without evidence of aneurysm, dissection, vasculitis or significant stenosis. Proximal Outflow: Bilateral common femoral and visualized portions of the superficial and profunda femoral arteries are patent without evidence of aneurysm, dissection, vasculitis or significant stenosis. Veins: No focal venous abnormality or evidence of thrombosis. Review of the MIP images confirms the above findings. NON-VASCULAR  Lower chest: Enlarging subpleural nodule in the right middle lobe now measuring 1.4 x 1.4 cm compared to 1.0 x 0.9 cm in November of 2024. Although this finding is in a region of prior radiation change, the growth and subtly spiculated margins raise concern for a primary bronchogenic malignancy versus metastatic pulmonary nodule. There is a small hiatal hernia. Hepatobiliary: Geographic hypoattenuation in the left hemi-liver adjacent to the fissure for the falciform ligament is nonspecific but most suggestive of benign focal fatty infiltration. Normal hepatic contour and morphology. No discrete hepatic lesions. Normal appearance of the gallbladder. No intra or extrahepatic biliary ductal dilatation. Pancreas: Unremarkable. No pancreatic ductal dilatation or surrounding inflammatory changes. Spleen: No splenic injury or perisplenic hematoma. Adrenals/Urinary Tract: Normal adrenal glands. No  hydronephrosis, nephrolithiasis or enhancing renal mass. Ureters and bladder are unremarkable. Stomach/Bowel: No evidence of contrast extravasation to suggest acute GI bleeding. Multiple colonic diverticula again noted. Similar appearance of mild submucosal thickening along the descending and proximal sigmoid colon. Normal appendix. No evidence of bowel obstruction. Lymphatic: No suspicious lymphadenopathy. Reproductive: Uterus and bilateral adnexa are unremarkable. Other: No abdominal wall hernia or abnormality. No abdominopelvic ascites. Musculoskeletal: No acute fracture or aggressive appearing lytic or blastic osseous lesion. IMPRESSION: 1. No evidence of active GI bleeding at time of imaging. 2. Enlarging subpleural nodule in the anterior right middle lobe compared to November of 2024. Although this finding is in a region of prior radiation change, the growth and subtly spiculated margins raise concern for primary bronchogenic malignancy versus a metastatic pulmonary nodule. Recommend further evaluation with PET-CT. 3. Improving descending and sigmoid colitis. 4. Additional ancillary findings as above without significant interval change. Electronically Signed   By: Malachy Moan M.D.   On: 08/15/2023 14:27   CT ABDOMEN PELVIS W CONTRAST Result Date: 08/07/2023 CLINICAL DATA:  Severe diarrhea.  Recurrent C difficile colitis. EXAM: CT ABDOMEN AND PELVIS WITH CONTRAST TECHNIQUE: Multidetector CT imaging of the abdomen and pelvis was performed using the standard protocol following bolus administration of intravenous contrast. RADIATION DOSE REDUCTION: This exam was performed according to the departmental dose-optimization program which includes automated exposure control, adjustment of the mA and/or kV according to patient size and/or use of iterative reconstruction technique. CONTRAST:  OMNIPAQUE IOHEXOL 300 MG/ML  SOLN COMPARISON:  06/27/2023 FINDINGS: Lower Chest: No acute findings. Scarring and  traction bronchiectasis again seen in the right middle lobe. Hepatobiliary: No suspicious hepatic masses identified. Gallbladder is unremarkable. No evidence of biliary ductal dilatation. Pancreas:  No mass or inflammatory changes. Spleen: Within normal limits in size and appearance. Adrenals/Urinary Tract: No suspicious masses identified. No evidence of ureteral calculi or hydronephrosis. Stomach/Bowel: Small hiatal hernia again seen. Mild wall thickening of distal esophagus is consistent with esophagitis. Mild to moderate wall thickening of the descending and rectosigmoid colon is seen, without significant change since prior study. No evidence of perforation or abscess. Vascular/Lymphatic: No pathologically enlarged lymph nodes. No acute vascular findings. Reproductive:  No mass or other significant abnormality. Other:  None. Musculoskeletal:  No suspicious bone lesions identified. IMPRESSION: Mild to moderate colitis involving the descending and rectosigmoid colon, without significant change since prior exam. No evidence of perforation or abscess. Small hiatal hernia, with distal esophagitis likely due to reflux. Electronically Signed   By: Danae Orleans M.D.   On: 08/07/2023 19:10   DG Chest 2 View Result Date: 08/02/2023 CLINICAL DATA:  Weakness. EXAM: CHEST - 2 VIEW COMPARISON:  Chest radiograph dated June 27, 2023. FINDINGS: The heart  size and mediastinal contours are within normal limits. Aortic atherosclerosis. No focal consolidation, pleural effusion, or pneumothorax. Similar dextrocurvature of the midthoracic spine. No acute osseous abnormality. IMPRESSION: No acute cardiopulmonary findings. Electronically Signed   By: Hart Robinsons M.D.   On: 08/02/2023 11:38    Microbiology: Results for orders placed or performed during the hospital encounter of 08/02/23  Culture, blood (Routine x 2)     Status: None   Collection Time: 08/02/23 10:42 AM   Specimen: BLOOD  Result Value Ref Range Status    Specimen Description BLOOD RIGHT [facility]  Final   Special Requests   Final    BOTTLES DRAWN AEROBIC AND ANAEROBIC Blood Culture adequate volume   Culture   Final    NO GROWTH 5 DAYS Performed at [facility], [address] [place], [place] [address]    Report Status 08/07/2023 FINAL  Final  Culture, blood (Routine x 2)     Status: None   Collection Time: 08/02/23 11:54 AM   Specimen: Right Antecubital; Blood  Result Value Ref Range Status   Specimen Description RIGHT ANTECUBITAL  Final   Special Requests   Final    BOTTLES DRAWN AEROBIC AND ANAEROBIC Blood Culture results may not be optimal due to an inadequate volume of blood received in culture bottles   Culture   Final    NO GROWTH 5 DAYS Performed at [facility], [address] [place], [place] [address]    Report Status 08/07/2023 FINAL  Final  C Difficile Quick Screen w PCR reflex     Status: Abnormal   Collection Time: 08/02/23  2:05 PM  Result Value Ref Range Status   C Diff antigen POSITIVE (A) NEGATIVE Final   C Diff toxin NEGATIVE NEGATIVE Final   C Diff interpretation Results are indeterminate. See PCR results.  Final    Comment: Performed at [facility], [address] [place], [place] [address]  Gastrointestinal Panel by PCR , Stool     Status: None   Collection Time: 08/02/23  2:05 PM  Result Value Ref Range Status   Campylobacter species NOT DETECTED NOT DETECTED Final   Plesimonas shigelloides NOT DETECTED NOT DETECTED Final   Salmonella species NOT DETECTED NOT DETECTED Final   Yersinia enterocolitica NOT DETECTED NOT DETECTED Final   Vibrio species NOT DETECTED NOT DETECTED Final   Vibrio cholerae NOT DETECTED NOT DETECTED Final   Enteroaggregative E coli (EAEC) NOT DETECTED NOT DETECTED Final   Enteropathogenic E coli (EPEC) NOT DETECTED NOT DETECTED Final   Enterotoxigenic E coli (ETEC) NOT DETECTED NOT DETECTED Final   Shiga like toxin producing E coli (STEC) NOT DETECTED  NOT DETECTED Final   Shigella/Enteroinvasive E coli (EIEC) NOT DETECTED NOT DETECTED Final   Cryptosporidium NOT DETECTED NOT DETECTED Final   Cyclospora cayetanensis NOT DETECTED NOT DETECTED Final   Entamoeba histolytica NOT DETECTED NOT DETECTED Final   Giardia lamblia NOT DETECTED NOT DETECTED Final   Adenovirus F40/41 NOT DETECTED NOT DETECTED Final   Astrovirus NOT DETECTED NOT DETECTED Final   Norovirus GI/GII NOT DETECTED NOT DETECTED Final   Rotavirus A NOT DETECTED NOT DETECTED Final   Sapovirus (I, II, IV, and V) NOT DETECTED NOT DETECTED Final    Comment: Performed at [facility], [address] [place], [place] [address]  C. Diff by PCR, Reflexed     Status: Abnormal   Collection Time: 08/02/23  2:05 PM  Result Value Ref Range Status  Toxigenic C. Difficile by PCR POSITIVE (A) NEGATIVE Final    Comment: Positive for toxigenic C. difficile with little to no toxin production. Only treat if clinical presentation suggests symptomatic illness. Performed at [facility], [address] [place], [place] [address]     Labs: CBC: Recent Labs  Lab 08/15/23 0455 08/15/23 2327 08/16/23 0518 08/16/23 1704 08/17/23 0917 08/18/23 0513 08/20/23 0536  WBC 6.3  --  8.2 7.6 7.7 7.1 5.7  NEUTROABS 3.0  --  5.4  --  5.1 3.9 3.0  HGB 6.1*   < > 9.9* 9.7* 10.2* 9.4* 8.7*  HCT 18.0*   < > 27.2* 27.4* 28.4* 26.3* 24.8*  MCV 93.8  --  85.0 87.8 86.3 88.0 89.2  PLT 308  --  269 280 311 334 358   < > = values in this interval not displayed.   Basic Metabolic Panel: Recent Labs  Lab 08/14/23 0801 08/15/23 0455 08/16/23 0518 08/16/23 6433 08/17/23 0423 08/17/23 0917 08/18/23 0513 08/20/23 0536  NA 131* 131* 129* 130*  --  128* 129* 131*  K 3.6 4.1 3.8 3.9  --  3.8 3.8 3.5  CL 100 98 98 97*  --  97* 99 97*  CO2 23 23 22 22   --  20* 22 23  GLUCOSE 89 89 78 92  --  88 85 82  BUN 10 13 16 16   --  14 17 15   CREATININE 0.94 0.97 0.87 0.92  --  0.92 0.93 1.01*   CALCIUM 7.9* 8.0* 8.0* 8.2*  --  8.2* 8.2* 8.3*  MG 1.9 1.8  --   --  1.9  --   --   --   PHOS 2.0* 2.4* 2.2*  --  2.6  --  2.8  --    Liver Function Tests: Recent Labs  Lab 08/16/23 0518 08/18/23 0513  ALBUMIN 2.4* 2.5*   CBG: No results for input(s): "GLUCAP" in the last 168 hours.  Discharge time spent: greater than 30 minutes.  Signed: Jonah Blue, MD [facility] 08/20/2023

## 2023-08-20 NOTE — Plan of Care (Signed)

## 2023-08-20 NOTE — Progress Notes (Signed)
Pt report called to [facility]. Pt waiting on EMS to transfer to facility

## 2023-08-20 NOTE — Care Management Important Message (Signed)
Important Message  Patient Details  Name: Abigail Ortega MRN: [id number] Date of Birth: October 27, 1950   Important Message Given:  Yes - Medicare IM     Sherilyn Banker 08/20/2023, 1:50 PM

## 2023-08-20 NOTE — TOC Transition Note (Signed)
Transition of Care [facility]) - Discharge Note   Patient Details  Name: Abigail Ortega MRN: [id number] Date of Birth: 24-Jan-1951  Transition of Care [facility]) CM/SW Contact:  Chapman Fitch, RN Phone Number: 08/20/2023, 3:36 PM   Clinical Narrative:    [facility] obtained for [facility]  Patient will DC to [facility] Anticipated DC date:08/20/23  Family notified son Scientist, physiological byWendie Simmer  Per MD patient ready for DC to . RN, patient, patient's family, and facility notified of DC. Discharge Summary sent to facility. RN given number for report. DC packet on chart. Ambulance transport requested for patient.  TOC signing off.      Barriers to Discharge: Continued Medical Work up   Patient Goals and CMS Choice Patient states their goals for this hospitalization and ongoing recovery are:: SNF CMS Medicare.gov Compare Post Acute Care list provided to:: Patient Choice offered to / list presented to : Patient, Adult Children      Discharge Placement                       Discharge Plan and Services Additional resources added to the After Visit Summary for                                       Social Drivers of Health (SDOH) Interventions SDOH Screenings   Food Insecurity: No Food Insecurity (08/03/2023)  Housing: Low Risk  (08/03/2023)  Transportation Needs: No Transportation Needs (08/03/2023)  Utilities: Not At Risk (08/03/2023)  Social Connections: Moderately Integrated (08/03/2023)  Tobacco Use: Medium Risk (08/02/2023)     Readmission Risk Interventions     No data to display

## 2023-08-20 NOTE — TOC Progression Note (Signed)
Transition of Care [facility]) - Progression Note    Patient Details  Name: Abigail Ortega MRN: [id number] Date of Birth: 01-03-1951  Transition of [facility]) CM/SW Contact  Chapman Fitch, RN Phone Number: 08/20/2023, 9:55 AM  Clinical Narrative:     Per navi portal.   "We are offering the treating practitioner an option to speak with a Medical Director before final determination. Provider to call: [phone] Option 5. Deadline is: 08/20/23 12:00 PM EST If no response, MD will render determination."  MD notified   Expected Discharge Plan: Skilled Nursing Facility Barriers to Discharge: Continued Medical Work up  Expected Discharge Plan and Services       Living arrangements for the past 2 months: Single Family Home                                       Social Determinants of Health (SDOH) Interventions SDOH Screenings   Food Insecurity: No Food Insecurity (08/03/2023)  Housing: Low Risk  (08/03/2023)  Transportation Needs: No Transportation Needs (08/03/2023)  Utilities: Not At Risk (08/03/2023)  Social Connections: Moderately Integrated (08/03/2023)  Tobacco Use: Medium Risk (08/02/2023)    Readmission Risk Interventions     No data to display

## 2023-08-22 DIAGNOSIS — I509 Heart failure, unspecified: Secondary | ICD-10-CM | POA: Diagnosis not present

## 2023-08-22 DIAGNOSIS — I9589 Other hypotension: Secondary | ICD-10-CM | POA: Diagnosis not present

## 2023-08-22 DIAGNOSIS — K209 Esophagitis, unspecified without bleeding: Secondary | ICD-10-CM | POA: Diagnosis not present

## 2023-08-22 DIAGNOSIS — K5732 Diverticulitis of large intestine without perforation or abscess without bleeding: Secondary | ICD-10-CM | POA: Diagnosis not present

## 2023-08-22 DIAGNOSIS — Z8619 Personal history of other infectious and parasitic diseases: Secondary | ICD-10-CM | POA: Diagnosis not present

## 2023-08-22 DIAGNOSIS — E039 Hypothyroidism, unspecified: Secondary | ICD-10-CM | POA: Diagnosis not present

## 2023-08-22 DIAGNOSIS — E785 Hyperlipidemia, unspecified: Secondary | ICD-10-CM | POA: Diagnosis not present

## 2023-08-22 DIAGNOSIS — I2510 Atherosclerotic heart disease of native coronary artery without angina pectoris: Secondary | ICD-10-CM | POA: Diagnosis not present

## 2023-08-22 DIAGNOSIS — K449 Diaphragmatic hernia without obstruction or gangrene: Secondary | ICD-10-CM | POA: Diagnosis not present

## 2023-08-24 ENCOUNTER — Ambulatory Visit: Payer: Medicare HMO | Admitting: Cardiology

## 2023-08-24 DIAGNOSIS — E039 Hypothyroidism, unspecified: Secondary | ICD-10-CM | POA: Diagnosis not present

## 2023-08-24 DIAGNOSIS — K449 Diaphragmatic hernia without obstruction or gangrene: Secondary | ICD-10-CM | POA: Diagnosis not present

## 2023-08-24 DIAGNOSIS — R911 Solitary pulmonary nodule: Secondary | ICD-10-CM | POA: Diagnosis not present

## 2023-08-24 DIAGNOSIS — I9589 Other hypotension: Secondary | ICD-10-CM | POA: Diagnosis not present

## 2023-08-24 DIAGNOSIS — K209 Esophagitis, unspecified without bleeding: Secondary | ICD-10-CM | POA: Diagnosis not present

## 2023-08-24 DIAGNOSIS — I509 Heart failure, unspecified: Secondary | ICD-10-CM | POA: Diagnosis not present

## 2023-08-24 DIAGNOSIS — K5732 Diverticulitis of large intestine without perforation or abscess without bleeding: Secondary | ICD-10-CM | POA: Diagnosis not present

## 2023-08-24 DIAGNOSIS — I2510 Atherosclerotic heart disease of native coronary artery without angina pectoris: Secondary | ICD-10-CM | POA: Diagnosis not present

## 2023-08-24 DIAGNOSIS — Z8619 Personal history of other infectious and parasitic diseases: Secondary | ICD-10-CM | POA: Diagnosis not present

## 2023-08-27 DIAGNOSIS — K209 Esophagitis, unspecified without bleeding: Secondary | ICD-10-CM | POA: Diagnosis not present

## 2023-08-27 DIAGNOSIS — D509 Iron deficiency anemia, unspecified: Secondary | ICD-10-CM | POA: Diagnosis not present

## 2023-08-27 DIAGNOSIS — K2100 Gastro-esophageal reflux disease with esophagitis, without bleeding: Secondary | ICD-10-CM | POA: Diagnosis not present

## 2023-08-27 DIAGNOSIS — K5732 Diverticulitis of large intestine without perforation or abscess without bleeding: Secondary | ICD-10-CM | POA: Diagnosis not present

## 2023-08-27 DIAGNOSIS — I9589 Other hypotension: Secondary | ICD-10-CM | POA: Diagnosis not present

## 2023-08-27 DIAGNOSIS — R911 Solitary pulmonary nodule: Secondary | ICD-10-CM | POA: Diagnosis not present

## 2023-08-27 DIAGNOSIS — K449 Diaphragmatic hernia without obstruction or gangrene: Secondary | ICD-10-CM | POA: Diagnosis not present

## 2023-08-27 DIAGNOSIS — I509 Heart failure, unspecified: Secondary | ICD-10-CM | POA: Diagnosis not present

## 2023-08-27 DIAGNOSIS — E039 Hypothyroidism, unspecified: Secondary | ICD-10-CM | POA: Diagnosis not present

## 2023-08-28 ENCOUNTER — Other Ambulatory Visit: Payer: Self-pay

## 2023-08-28 DIAGNOSIS — E039 Hypothyroidism, unspecified: Secondary | ICD-10-CM | POA: Diagnosis not present

## 2023-08-28 DIAGNOSIS — I9589 Other hypotension: Secondary | ICD-10-CM | POA: Diagnosis not present

## 2023-08-28 DIAGNOSIS — I2510 Atherosclerotic heart disease of native coronary artery without angina pectoris: Secondary | ICD-10-CM | POA: Diagnosis not present

## 2023-08-28 DIAGNOSIS — I509 Heart failure, unspecified: Secondary | ICD-10-CM | POA: Diagnosis not present

## 2023-08-28 DIAGNOSIS — K209 Esophagitis, unspecified without bleeding: Secondary | ICD-10-CM | POA: Diagnosis not present

## 2023-08-28 DIAGNOSIS — K2100 Gastro-esophageal reflux disease with esophagitis, without bleeding: Secondary | ICD-10-CM | POA: Diagnosis not present

## 2023-08-28 DIAGNOSIS — K449 Diaphragmatic hernia without obstruction or gangrene: Secondary | ICD-10-CM | POA: Diagnosis not present

## 2023-08-28 DIAGNOSIS — D509 Iron deficiency anemia, unspecified: Secondary | ICD-10-CM | POA: Diagnosis not present

## 2023-08-28 DIAGNOSIS — K5732 Diverticulitis of large intestine without perforation or abscess without bleeding: Secondary | ICD-10-CM | POA: Diagnosis not present

## 2023-08-28 MED ORDER — DICYCLOMINE HCL 10 MG PO CAPS: 10.0000 mg | ORAL_CAPSULE | Freq: Three times a day (TID) | ORAL | 0 refills | Status: DC

## 2023-08-29 DIAGNOSIS — I9589 Other hypotension: Secondary | ICD-10-CM | POA: Diagnosis not present

## 2023-08-29 DIAGNOSIS — K5732 Diverticulitis of large intestine without perforation or abscess without bleeding: Secondary | ICD-10-CM | POA: Diagnosis not present

## 2023-08-29 DIAGNOSIS — I509 Heart failure, unspecified: Secondary | ICD-10-CM | POA: Diagnosis not present

## 2023-08-29 DIAGNOSIS — J449 Chronic obstructive pulmonary disease, unspecified: Secondary | ICD-10-CM | POA: Diagnosis not present

## 2023-08-29 DIAGNOSIS — D509 Iron deficiency anemia, unspecified: Secondary | ICD-10-CM | POA: Diagnosis not present

## 2023-08-29 DIAGNOSIS — E039 Hypothyroidism, unspecified: Secondary | ICD-10-CM | POA: Diagnosis not present

## 2023-08-29 DIAGNOSIS — I2510 Atherosclerotic heart disease of native coronary artery without angina pectoris: Secondary | ICD-10-CM | POA: Diagnosis not present

## 2023-08-29 DIAGNOSIS — K2100 Gastro-esophageal reflux disease with esophagitis, without bleeding: Secondary | ICD-10-CM | POA: Diagnosis not present

## 2023-08-29 DIAGNOSIS — K5900 Constipation, unspecified: Secondary | ICD-10-CM | POA: Diagnosis not present

## 2023-08-31 DIAGNOSIS — I9589 Other hypotension: Secondary | ICD-10-CM | POA: Diagnosis not present

## 2023-08-31 DIAGNOSIS — K5732 Diverticulitis of large intestine without perforation or abscess without bleeding: Secondary | ICD-10-CM | POA: Diagnosis not present

## 2023-08-31 DIAGNOSIS — J449 Chronic obstructive pulmonary disease, unspecified: Secondary | ICD-10-CM | POA: Diagnosis not present

## 2023-08-31 DIAGNOSIS — E039 Hypothyroidism, unspecified: Secondary | ICD-10-CM | POA: Diagnosis not present

## 2023-08-31 DIAGNOSIS — K2100 Gastro-esophageal reflux disease with esophagitis, without bleeding: Secondary | ICD-10-CM | POA: Diagnosis not present

## 2023-08-31 DIAGNOSIS — I2510 Atherosclerotic heart disease of native coronary artery without angina pectoris: Secondary | ICD-10-CM | POA: Diagnosis not present

## 2023-08-31 DIAGNOSIS — I509 Heart failure, unspecified: Secondary | ICD-10-CM | POA: Diagnosis not present

## 2023-08-31 DIAGNOSIS — D509 Iron deficiency anemia, unspecified: Secondary | ICD-10-CM | POA: Diagnosis not present

## 2023-08-31 DIAGNOSIS — K5900 Constipation, unspecified: Secondary | ICD-10-CM | POA: Diagnosis not present

## 2023-09-03 ENCOUNTER — Ambulatory Visit: Payer: Medicare HMO | Admitting: Gastroenterology

## 2023-09-04 DIAGNOSIS — I130 Hypertensive heart and chronic kidney disease with heart failure and stage 1 through stage 4 chronic kidney disease, or unspecified chronic kidney disease: Secondary | ICD-10-CM | POA: Diagnosis not present

## 2023-09-04 DIAGNOSIS — N189 Chronic kidney disease, unspecified: Secondary | ICD-10-CM | POA: Diagnosis not present

## 2023-09-04 DIAGNOSIS — I2510 Atherosclerotic heart disease of native coronary artery without angina pectoris: Secondary | ICD-10-CM | POA: Diagnosis not present

## 2023-09-04 DIAGNOSIS — J449 Chronic obstructive pulmonary disease, unspecified: Secondary | ICD-10-CM | POA: Diagnosis not present

## 2023-09-04 DIAGNOSIS — D689 Coagulation defect, unspecified: Secondary | ICD-10-CM | POA: Diagnosis not present

## 2023-09-04 DIAGNOSIS — N179 Acute kidney failure, unspecified: Secondary | ICD-10-CM | POA: Diagnosis not present

## 2023-09-04 DIAGNOSIS — A0472 Enterocolitis due to Clostridium difficile, not specified as recurrent: Secondary | ICD-10-CM | POA: Diagnosis not present

## 2023-09-04 DIAGNOSIS — E440 Moderate protein-calorie malnutrition: Secondary | ICD-10-CM | POA: Diagnosis not present

## 2023-09-04 DIAGNOSIS — I5042 Chronic combined systolic (congestive) and diastolic (congestive) heart failure: Secondary | ICD-10-CM | POA: Diagnosis not present

## 2023-09-07 ENCOUNTER — Telehealth: Payer: Self-pay | Admitting: Internal Medicine

## 2023-09-07 DIAGNOSIS — N189 Chronic kidney disease, unspecified: Secondary | ICD-10-CM | POA: Diagnosis not present

## 2023-09-07 DIAGNOSIS — J449 Chronic obstructive pulmonary disease, unspecified: Secondary | ICD-10-CM | POA: Diagnosis not present

## 2023-09-07 DIAGNOSIS — D689 Coagulation defect, unspecified: Secondary | ICD-10-CM | POA: Diagnosis not present

## 2023-09-07 DIAGNOSIS — I2510 Atherosclerotic heart disease of native coronary artery without angina pectoris: Secondary | ICD-10-CM | POA: Diagnosis not present

## 2023-09-07 DIAGNOSIS — I130 Hypertensive heart and chronic kidney disease with heart failure and stage 1 through stage 4 chronic kidney disease, or unspecified chronic kidney disease: Secondary | ICD-10-CM | POA: Diagnosis not present

## 2023-09-07 DIAGNOSIS — N179 Acute kidney failure, unspecified: Secondary | ICD-10-CM | POA: Diagnosis not present

## 2023-09-07 DIAGNOSIS — E440 Moderate protein-calorie malnutrition: Secondary | ICD-10-CM | POA: Diagnosis not present

## 2023-09-07 DIAGNOSIS — I5042 Chronic combined systolic (congestive) and diastolic (congestive) heart failure: Secondary | ICD-10-CM | POA: Diagnosis not present

## 2023-09-07 DIAGNOSIS — A0472 Enterocolitis due to Clostridium difficile, not specified as recurrent: Secondary | ICD-10-CM | POA: Diagnosis not present

## 2023-09-07 NOTE — Telephone Encounter (Signed)
Thu, PT with [facility], left VM requesting verbal orders for PT 1w9.  Callback number is [phone].  Per Dr. Welton Flakes okay to give verbal orders.

## 2023-09-10 DIAGNOSIS — N179 Acute kidney failure, unspecified: Secondary | ICD-10-CM | POA: Diagnosis not present

## 2023-09-10 DIAGNOSIS — I130 Hypertensive heart and chronic kidney disease with heart failure and stage 1 through stage 4 chronic kidney disease, or unspecified chronic kidney disease: Secondary | ICD-10-CM | POA: Diagnosis not present

## 2023-09-10 DIAGNOSIS — A0472 Enterocolitis due to Clostridium difficile, not specified as recurrent: Secondary | ICD-10-CM | POA: Diagnosis not present

## 2023-09-10 DIAGNOSIS — N189 Chronic kidney disease, unspecified: Secondary | ICD-10-CM | POA: Diagnosis not present

## 2023-09-10 DIAGNOSIS — E440 Moderate protein-calorie malnutrition: Secondary | ICD-10-CM | POA: Diagnosis not present

## 2023-09-10 DIAGNOSIS — I2510 Atherosclerotic heart disease of native coronary artery without angina pectoris: Secondary | ICD-10-CM | POA: Diagnosis not present

## 2023-09-10 DIAGNOSIS — D689 Coagulation defect, unspecified: Secondary | ICD-10-CM | POA: Diagnosis not present

## 2023-09-10 DIAGNOSIS — I5042 Chronic combined systolic (congestive) and diastolic (congestive) heart failure: Secondary | ICD-10-CM | POA: Diagnosis not present

## 2023-09-10 DIAGNOSIS — J449 Chronic obstructive pulmonary disease, unspecified: Secondary | ICD-10-CM | POA: Diagnosis not present

## 2023-09-11 ENCOUNTER — Telehealth: Payer: Self-pay

## 2023-09-11 NOTE — Telephone Encounter (Signed)
 HH with [facility] called requesting verbal orders for pt: orders were : 1 week 8

## 2023-09-13 DIAGNOSIS — N189 Chronic kidney disease, unspecified: Secondary | ICD-10-CM | POA: Diagnosis not present

## 2023-09-13 DIAGNOSIS — N179 Acute kidney failure, unspecified: Secondary | ICD-10-CM | POA: Diagnosis not present

## 2023-09-13 DIAGNOSIS — E440 Moderate protein-calorie malnutrition: Secondary | ICD-10-CM | POA: Diagnosis not present

## 2023-09-13 DIAGNOSIS — I5042 Chronic combined systolic (congestive) and diastolic (congestive) heart failure: Secondary | ICD-10-CM | POA: Diagnosis not present

## 2023-09-13 DIAGNOSIS — I130 Hypertensive heart and chronic kidney disease with heart failure and stage 1 through stage 4 chronic kidney disease, or unspecified chronic kidney disease: Secondary | ICD-10-CM | POA: Diagnosis not present

## 2023-09-13 DIAGNOSIS — J449 Chronic obstructive pulmonary disease, unspecified: Secondary | ICD-10-CM | POA: Diagnosis not present

## 2023-09-13 DIAGNOSIS — A0472 Enterocolitis due to Clostridium difficile, not specified as recurrent: Secondary | ICD-10-CM | POA: Diagnosis not present

## 2023-09-13 DIAGNOSIS — D689 Coagulation defect, unspecified: Secondary | ICD-10-CM | POA: Diagnosis not present

## 2023-09-13 DIAGNOSIS — I2510 Atherosclerotic heart disease of native coronary artery without angina pectoris: Secondary | ICD-10-CM | POA: Diagnosis not present

## 2023-09-14 DIAGNOSIS — I2510 Atherosclerotic heart disease of native coronary artery without angina pectoris: Secondary | ICD-10-CM | POA: Diagnosis not present

## 2023-09-14 DIAGNOSIS — N189 Chronic kidney disease, unspecified: Secondary | ICD-10-CM | POA: Diagnosis not present

## 2023-09-14 DIAGNOSIS — I5042 Chronic combined systolic (congestive) and diastolic (congestive) heart failure: Secondary | ICD-10-CM | POA: Diagnosis not present

## 2023-09-14 DIAGNOSIS — I130 Hypertensive heart and chronic kidney disease with heart failure and stage 1 through stage 4 chronic kidney disease, or unspecified chronic kidney disease: Secondary | ICD-10-CM | POA: Diagnosis not present

## 2023-09-14 DIAGNOSIS — N179 Acute kidney failure, unspecified: Secondary | ICD-10-CM | POA: Diagnosis not present

## 2023-09-14 DIAGNOSIS — J449 Chronic obstructive pulmonary disease, unspecified: Secondary | ICD-10-CM | POA: Diagnosis not present

## 2023-09-14 DIAGNOSIS — E440 Moderate protein-calorie malnutrition: Secondary | ICD-10-CM | POA: Diagnosis not present

## 2023-09-14 DIAGNOSIS — A0472 Enterocolitis due to Clostridium difficile, not specified as recurrent: Secondary | ICD-10-CM | POA: Diagnosis not present

## 2023-09-14 DIAGNOSIS — D689 Coagulation defect, unspecified: Secondary | ICD-10-CM | POA: Diagnosis not present

## 2023-09-17 ENCOUNTER — Telehealth: Payer: Self-pay

## 2023-09-17 DIAGNOSIS — N189 Chronic kidney disease, unspecified: Secondary | ICD-10-CM | POA: Diagnosis not present

## 2023-09-17 DIAGNOSIS — D689 Coagulation defect, unspecified: Secondary | ICD-10-CM | POA: Diagnosis not present

## 2023-09-17 DIAGNOSIS — I130 Hypertensive heart and chronic kidney disease with heart failure and stage 1 through stage 4 chronic kidney disease, or unspecified chronic kidney disease: Secondary | ICD-10-CM | POA: Diagnosis not present

## 2023-09-17 DIAGNOSIS — I5042 Chronic combined systolic (congestive) and diastolic (congestive) heart failure: Secondary | ICD-10-CM | POA: Diagnosis not present

## 2023-09-17 DIAGNOSIS — N179 Acute kidney failure, unspecified: Secondary | ICD-10-CM | POA: Diagnosis not present

## 2023-09-17 DIAGNOSIS — I2510 Atherosclerotic heart disease of native coronary artery without angina pectoris: Secondary | ICD-10-CM | POA: Diagnosis not present

## 2023-09-17 DIAGNOSIS — A0472 Enterocolitis due to Clostridium difficile, not specified as recurrent: Secondary | ICD-10-CM | POA: Diagnosis not present

## 2023-09-17 DIAGNOSIS — E440 Moderate protein-calorie malnutrition: Secondary | ICD-10-CM | POA: Diagnosis not present

## 2023-09-17 DIAGNOSIS — J449 Chronic obstructive pulmonary disease, unspecified: Secondary | ICD-10-CM | POA: Diagnosis not present

## 2023-09-17 NOTE — Telephone Encounter (Signed)
 I called the patient son back, but he didn't answer so I left him a voicemail to call us back.

## 2023-09-17 NOTE — Telephone Encounter (Signed)
 Patient's son called stating that he was calling to schedule an appointment since her mother was recommended to go to the ED and therefore now needing a follow up appointment. Please advise the front desk as of when she could be seen. Thank you.

## 2023-09-18 ENCOUNTER — Telehealth: Payer: Self-pay

## 2023-09-18 ENCOUNTER — Inpatient Hospital Stay
Admission: EM | Admit: 2023-09-18 | Discharge: 2023-10-05 | DRG: 372 | Disposition: A | Discharge status: Skilled Nursing Facility | Attending: Hospitalist | Admitting: Hospitalist

## 2023-09-18 ENCOUNTER — Other Ambulatory Visit: Payer: Self-pay

## 2023-09-18 ENCOUNTER — Emergency Department

## 2023-09-18 DIAGNOSIS — E039 Hypothyroidism, unspecified: Secondary | ICD-10-CM | POA: Diagnosis present

## 2023-09-18 DIAGNOSIS — D638 Anemia in other chronic diseases classified elsewhere: Secondary | ICD-10-CM | POA: Diagnosis not present

## 2023-09-18 DIAGNOSIS — W1830XA Fall on same level, unspecified, initial encounter: Secondary | ICD-10-CM | POA: Diagnosis not present

## 2023-09-18 DIAGNOSIS — N2581 Secondary hyperparathyroidism of renal origin: Secondary | ICD-10-CM | POA: Diagnosis not present

## 2023-09-18 DIAGNOSIS — R112 Nausea with vomiting, unspecified: Secondary | ICD-10-CM

## 2023-09-18 DIAGNOSIS — I9589 Other hypotension: Secondary | ICD-10-CM | POA: Diagnosis present

## 2023-09-18 DIAGNOSIS — J449 Chronic obstructive pulmonary disease, unspecified: Secondary | ICD-10-CM | POA: Diagnosis not present

## 2023-09-18 DIAGNOSIS — R197 Diarrhea, unspecified: Secondary | ICD-10-CM | POA: Diagnosis not present

## 2023-09-18 DIAGNOSIS — A0472 Enterocolitis due to Clostridium difficile, not specified as recurrent: Secondary | ICD-10-CM | POA: Insufficient documentation

## 2023-09-18 DIAGNOSIS — Z79890 Hormone replacement therapy (postmenopausal): Secondary | ICD-10-CM

## 2023-09-18 DIAGNOSIS — E785 Hyperlipidemia, unspecified: Secondary | ICD-10-CM | POA: Diagnosis present

## 2023-09-18 DIAGNOSIS — I5042 Chronic combined systolic (congestive) and diastolic (congestive) heart failure: Secondary | ICD-10-CM | POA: Diagnosis present

## 2023-09-18 DIAGNOSIS — C50311 Malignant neoplasm of lower-inner quadrant of right female breast: Secondary | ICD-10-CM | POA: Diagnosis present

## 2023-09-18 DIAGNOSIS — R9431 Abnormal electrocardiogram [ECG] [EKG]: Secondary | ICD-10-CM | POA: Diagnosis present

## 2023-09-18 DIAGNOSIS — Z87891 Personal history of nicotine dependence: Secondary | ICD-10-CM | POA: Diagnosis not present

## 2023-09-18 DIAGNOSIS — R911 Solitary pulmonary nodule: Secondary | ICD-10-CM | POA: Diagnosis present

## 2023-09-18 DIAGNOSIS — R109 Unspecified abdominal pain: Secondary | ICD-10-CM | POA: Diagnosis not present

## 2023-09-18 DIAGNOSIS — A0471 Enterocolitis due to Clostridium difficile, recurrent: Principal | ICD-10-CM | POA: Diagnosis present

## 2023-09-18 DIAGNOSIS — N3289 Other specified disorders of bladder: Secondary | ICD-10-CM | POA: Diagnosis not present

## 2023-09-18 DIAGNOSIS — Z9011 Acquired absence of right breast and nipple: Secondary | ICD-10-CM

## 2023-09-18 DIAGNOSIS — A09 Infectious gastroenteritis and colitis, unspecified: Secondary | ICD-10-CM | POA: Diagnosis not present

## 2023-09-18 DIAGNOSIS — Z955 Presence of coronary angioplasty implant and graft: Secondary | ICD-10-CM | POA: Diagnosis not present

## 2023-09-18 DIAGNOSIS — Z7902 Long term (current) use of antithrombotics/antiplatelets: Secondary | ICD-10-CM | POA: Diagnosis not present

## 2023-09-18 DIAGNOSIS — E872 Acidosis, unspecified: Secondary | ICD-10-CM | POA: Diagnosis present

## 2023-09-18 DIAGNOSIS — Z9221 Personal history of antineoplastic chemotherapy: Secondary | ICD-10-CM | POA: Diagnosis not present

## 2023-09-18 DIAGNOSIS — S0003XA Contusion of scalp, initial encounter: Secondary | ICD-10-CM | POA: Diagnosis not present

## 2023-09-18 DIAGNOSIS — I130 Hypertensive heart and chronic kidney disease with heart failure and stage 1 through stage 4 chronic kidney disease, or unspecified chronic kidney disease: Secondary | ICD-10-CM | POA: Diagnosis not present

## 2023-09-18 DIAGNOSIS — Z888 Allergy status to other drugs, medicaments and biological substances status: Secondary | ICD-10-CM

## 2023-09-18 DIAGNOSIS — I2510 Atherosclerotic heart disease of native coronary artery without angina pectoris: Secondary | ICD-10-CM | POA: Diagnosis present

## 2023-09-18 DIAGNOSIS — E860 Dehydration: Secondary | ICD-10-CM | POA: Diagnosis present

## 2023-09-18 DIAGNOSIS — S0990XA Unspecified injury of head, initial encounter: Secondary | ICD-10-CM | POA: Diagnosis not present

## 2023-09-18 DIAGNOSIS — Y92230 Patient room in hospital as the place of occurrence of the external cause: Secondary | ICD-10-CM | POA: Diagnosis not present

## 2023-09-18 DIAGNOSIS — Z7982 Long term (current) use of aspirin: Secondary | ICD-10-CM

## 2023-09-18 DIAGNOSIS — F32A Depression, unspecified: Secondary | ICD-10-CM | POA: Diagnosis present

## 2023-09-18 DIAGNOSIS — D631 Anemia in chronic kidney disease: Secondary | ICD-10-CM | POA: Diagnosis present

## 2023-09-18 DIAGNOSIS — Z8619 Personal history of other infectious and parasitic diseases: Secondary | ICD-10-CM

## 2023-09-18 DIAGNOSIS — E876 Hypokalemia: Secondary | ICD-10-CM | POA: Diagnosis present

## 2023-09-18 DIAGNOSIS — Z79899 Other long term (current) drug therapy: Secondary | ICD-10-CM

## 2023-09-18 DIAGNOSIS — D75838 Other thrombocytosis: Secondary | ICD-10-CM | POA: Diagnosis present

## 2023-09-18 DIAGNOSIS — E861 Hypovolemia: Secondary | ICD-10-CM | POA: Diagnosis present

## 2023-09-18 DIAGNOSIS — K588 Other irritable bowel syndrome: Secondary | ICD-10-CM | POA: Diagnosis present

## 2023-09-18 DIAGNOSIS — R509 Fever, unspecified: Secondary | ICD-10-CM | POA: Diagnosis not present

## 2023-09-18 DIAGNOSIS — Z923 Personal history of irradiation: Secondary | ICD-10-CM

## 2023-09-18 DIAGNOSIS — R1084 Generalized abdominal pain: Secondary | ICD-10-CM | POA: Diagnosis not present

## 2023-09-18 DIAGNOSIS — I6523 Occlusion and stenosis of bilateral carotid arteries: Secondary | ICD-10-CM | POA: Diagnosis not present

## 2023-09-18 DIAGNOSIS — I252 Old myocardial infarction: Secondary | ICD-10-CM

## 2023-09-18 DIAGNOSIS — E871 Hypo-osmolality and hyponatremia: Secondary | ICD-10-CM | POA: Diagnosis present

## 2023-09-18 DIAGNOSIS — N189 Chronic kidney disease, unspecified: Secondary | ICD-10-CM | POA: Diagnosis present

## 2023-09-18 DIAGNOSIS — Z803 Family history of malignant neoplasm of breast: Secondary | ICD-10-CM

## 2023-09-18 DIAGNOSIS — Z853 Personal history of malignant neoplasm of breast: Secondary | ICD-10-CM

## 2023-09-18 HISTORY — DX: Enterocolitis due to Clostridium difficile, not specified as recurrent: A04.72

## 2023-09-18 LAB — CBC
HCT: 31.5 % — ABNORMAL LOW (ref 36.0–46.0)
Hemoglobin: 10.6 g/dL — ABNORMAL LOW (ref 12.0–15.0)
MCH: 30.9 pg (ref 26.0–34.0)
MCHC: 33.7 g/dL (ref 30.0–36.0)
MCV: 91.8 fL (ref 80.0–100.0)
Platelets: 440 10*3/uL — ABNORMAL HIGH (ref 150–400)
RBC: 3.43 MIL/uL — ABNORMAL LOW (ref 3.87–5.11)
RDW: 15.4 % (ref 11.5–15.5)
WBC: 9 10*3/uL (ref 4.0–10.5)
nRBC: 0 % (ref 0.0–0.2)

## 2023-09-18 LAB — LACTIC ACID, PLASMA
Lactic Acid, Venous: 1.4 mmol/L (ref 0.5–1.9)
Lactic Acid, Venous: 1.4 mmol/L (ref 0.5–1.9)

## 2023-09-18 LAB — COMPREHENSIVE METABOLIC PANEL
ALT: 8 U/L (ref 0–44)
AST: 17 U/L (ref 15–41)
Albumin: 2.8 g/dL — ABNORMAL LOW (ref 3.5–5.0)
Alkaline Phosphatase: 76 U/L (ref 38–126)
Anion gap: 16 — ABNORMAL HIGH (ref 5–15)
BUN: 10 mg/dL (ref 8–23)
CO2: 24 mmol/L (ref 22–32)
Calcium: 8.3 mg/dL — ABNORMAL LOW (ref 8.9–10.3)
Chloride: 97 mmol/L — ABNORMAL LOW (ref 98–111)
Creatinine, Ser: 1 mg/dL (ref 0.44–1.00)
GFR, Estimated: 59 mL/min — ABNORMAL LOW (ref >60)
Glucose, Bld: 101 mg/dL — ABNORMAL HIGH (ref 70–99)
Potassium: 2.5 mmol/L — CL (ref 3.5–5.1)
Sodium: 137 mmol/L (ref 135–145)
Total Bilirubin: 1.4 mg/dL — ABNORMAL HIGH (ref 0.0–1.2)
Total Protein: 6.1 g/dL — ABNORMAL LOW (ref 6.5–8.1)

## 2023-09-18 LAB — MAGNESIUM: Magnesium: 1.4 mg/dL — ABNORMAL LOW (ref 1.7–2.4)

## 2023-09-18 LAB — LIPASE, BLOOD: Lipase: 20 U/L (ref 11–51)

## 2023-09-18 MED ORDER — IOHEXOL 300 MG/ML  SOLN
100.0000 mL | Freq: Once | INTRAMUSCULAR | Status: AC | PRN
  Administered 2023-09-18: 100 mL via INTRAVENOUS

## 2023-09-18 MED ORDER — POTASSIUM CHLORIDE 10 MEQ/100ML IV SOLN
10.0000 meq | Freq: Once | INTRAVENOUS | Status: AC
  Administered 2023-09-18: 10 meq via INTRAVENOUS
  Filled 2023-09-18: qty 100

## 2023-09-18 MED ORDER — ACETAMINOPHEN 325 MG PO TABS
650.0000 mg | ORAL_TABLET | Freq: Four times a day (QID) | ORAL | Status: DC | PRN
  Administered 2023-09-19 – 2023-09-27 (×7): 650 mg via ORAL
  Filled 2023-09-18 (×7): qty 2

## 2023-09-18 MED ORDER — ONDANSETRON HCL 4 MG/2ML IJ SOLN
4.0000 mg | Freq: Once | INTRAMUSCULAR | Status: AC
  Administered 2023-09-18: 4 mg via INTRAVENOUS
  Filled 2023-09-18: qty 2

## 2023-09-18 MED ORDER — MORPHINE SULFATE (PF) 2 MG/ML IV SOLN: 2.0000 mg | INTRAVENOUS | Status: DC | PRN

## 2023-09-18 MED ORDER — ISOSORBIDE MONONITRATE ER 30 MG PO TB24
120.0000 mg | ORAL_TABLET | Freq: Two times a day (BID) | ORAL | Status: DC
  Administered 2023-09-18 – 2023-09-24 (×9): 120 mg via ORAL
  Filled 2023-09-18 (×11): qty 4

## 2023-09-18 MED ORDER — MAGNESIUM SULFATE 2 GM/50ML IV SOLN
2.0000 g | Freq: Once | INTRAVENOUS | Status: AC
  Administered 2023-09-18: 2 g via INTRAVENOUS
  Filled 2023-09-18: qty 50

## 2023-09-18 MED ORDER — MIDODRINE HCL 5 MG PO TABS
10.0000 mg | ORAL_TABLET | Freq: Three times a day (TID) | ORAL | Status: DC
  Administered 2023-09-19 – 2023-09-22 (×10): 10 mg via ORAL
  Filled 2023-09-18 (×8): qty 2

## 2023-09-18 MED ORDER — DICYCLOMINE HCL 10 MG PO CAPS
10.0000 mg | ORAL_CAPSULE | Freq: Three times a day (TID) | ORAL | Status: DC
  Administered 2023-09-19 – 2023-10-05 (×50): 10 mg via ORAL
  Filled 2023-09-18 (×50): qty 1

## 2023-09-18 MED ORDER — ACETAMINOPHEN 650 MG RE SUPP
650.0000 mg | Freq: Four times a day (QID) | RECTAL | Status: DC | PRN
Start: 2023-09-18 — End: 2023-09-28

## 2023-09-18 MED ORDER — TRAZODONE HCL 100 MG PO TABS
100.0000 mg | ORAL_TABLET | Freq: Every evening | ORAL | Status: DC | PRN
  Administered 2023-09-18 – 2023-09-28 (×8): 100 mg via ORAL
  Filled 2023-09-18 (×9): qty 1

## 2023-09-18 MED ORDER — LACTATED RINGERS IV BOLUS
1000.0000 mL | Freq: Once | INTRAVENOUS | Status: AC
  Administered 2023-09-18: 1000 mL via INTRAVENOUS

## 2023-09-18 MED ORDER — MORPHINE SULFATE (PF) 2 MG/ML IV SOLN
2.0000 mg | Freq: Once | INTRAVENOUS | Status: AC
  Administered 2023-09-18: 2 mg via INTRAVENOUS
  Filled 2023-09-18: qty 1

## 2023-09-18 MED ORDER — HYDROCODONE-ACETAMINOPHEN 5-325 MG PO TABS
1.0000 | ORAL_TABLET | ORAL | Status: DC | PRN
  Administered 2023-09-28: 1 via ORAL
  Filled 2023-09-18: qty 1
  Filled 2023-09-18: qty 2

## 2023-09-18 MED ORDER — ATORVASTATIN CALCIUM 20 MG PO TABS
80.0000 mg | ORAL_TABLET | Freq: Every day | ORAL | Status: DC
  Administered 2023-09-19 – 2023-10-05 (×17): 80 mg via ORAL
  Filled 2023-09-18 (×18): qty 4

## 2023-09-18 MED ORDER — LEVOTHYROXINE SODIUM 88 MCG PO TABS
88.0000 ug | ORAL_TABLET | Freq: Every day | ORAL | Status: DC
  Administered 2023-09-19 – 2023-10-05 (×17): 88 ug via ORAL
  Filled 2023-09-18 (×18): qty 1

## 2023-09-18 MED ORDER — PANTOPRAZOLE SODIUM 40 MG PO TBEC
40.0000 mg | DELAYED_RELEASE_TABLET | Freq: Every day | ORAL | Status: DC
  Administered 2023-09-19 – 2023-10-05 (×17): 40 mg via ORAL
  Filled 2023-09-18 (×17): qty 1

## 2023-09-18 NOTE — Telephone Encounter (Signed)
 Tried reaching patient - no answer-need to schedule follow up with Abigail Ortega for C-diff infection.

## 2023-09-18 NOTE — ED Provider Notes (Signed)
 [facility] Provider Note    Event Date/Time   First MD Initiated Contact with Patient 09/18/23 1653     (approximate)   History   c diff, Diarrhea, and Abdominal Pain   HPI  Abigail Ortega is a 73 y.o. female with history of CKD, prior history of C. difficile, hypothyroidism, hypertension, presenting with 3 days of diarrhea, diffuse abdominal pain, nausea vomiting.  Family member noted that she started having blood in her stool 2 days ago.  No fevers.  No chest pain or shortness of breath.  No urinary symptoms or fever.  Independent history obtained from family.  On independent review, she was admitted in early February for persistent diarrhea as well as abdominal pain concerning for C. difficile.  She had a C. difficile PCR done, antigen positive, toxin negative, GI and ID were consulted and they thought that her diarrhea was due to postinfectious IBS.  She did have C. difficile in December 2024 and is status post treatment for that.     Physical Exam   Triage Vital Signs: ED Triage Vitals  Encounter Vitals Group     BP 09/18/23 1524 (!) 102/57     Systolic BP Percentile --      Diastolic BP Percentile --      Pulse Rate 09/18/23 1524 74     Resp 09/18/23 1524 20     Temp 09/18/23 1524 99.2 F (37.3 C)     Temp Source 09/18/23 1524 Oral     SpO2 09/18/23 1524 96 %     Weight 09/18/23 1522 115 lb (52.2 kg)     Height 09/18/23 1522 5\' 5"  (1.651 m)     Head Circumference --      Peak Flow --      Pain Score --      Pain Loc --      Pain Education --      Exclude from Growth Chart --     Most recent vital signs: Vitals:   09/18/23 1830 09/18/23 1936  BP: 132/72 (!) 145/72  Pulse: 82 90  Resp: 13 (!) 22  Temp:  98.2 F (36.8 C)  SpO2: 100% 98%     General: Awake, no distress.  CV:  Good peripheral perfusion.  Resp:  Normal effort.  Abd:  No distention.  Diffusely tender without guarding Other:  GU exam was done, there was  dark greenish stool with blood on her diaper.  Dry mucous membrane.   ED Results / Procedures / Treatments   Labs (all labs ordered are listed, but only abnormal results are displayed) Labs Reviewed  COMPREHENSIVE METABOLIC PANEL - Abnormal; Notable for the following components:      Result Value   Potassium 2.5 (*)    Chloride 97 (*)    Glucose, Bld 101 (*)    Calcium 8.3 (*)    Total Protein 6.1 (*)    Albumin 2.8 (*)    Total Bilirubin 1.4 (*)    GFR, Estimated 59 (*)    Anion gap 16 (*)    All other components within normal limits  CBC - Abnormal; Notable for the following components:   RBC 3.43 (*)    Hemoglobin 10.6 (*)    HCT 31.5 (*)    Platelets 440 (*)    All other components within normal limits  MAGNESIUM - Abnormal; Notable for the following components:   Magnesium 1.4 (*)    All other components within  normal limits  GASTROINTESTINAL PANEL BY PCR, STOOL (REPLACES STOOL CULTURE)  C DIFFICILE QUICK SCREEN W PCR REFLEX    LIPASE, BLOOD  LACTIC ACID, PLASMA  URINALYSIS, ROUTINE W REFLEX MICROSCOPIC  LACTIC ACID, PLASMA     EKG  Sinus rhythm, rate 89, normal QRS, prolonged QTc, T wave flattening in 2, 1, aVL, there are PVCs, no ischemic ST elevation, not significant change compared to prior   RADIOLOGY CT imaging, interpretation with obvious free air   PROCEDURES:  Critical Care performed: No  Procedures   MEDICATIONS ORDERED IN ED: Medications  potassium chloride 10 mEq in 100 mL IVPB (10 mEq Intravenous New Bag/Given 09/18/23 1932)  magnesium sulfate IVPB 2 g 50 mL (0 g Intravenous Stopped 09/18/23 1839)  potassium chloride 10 mEq in 100 mL IVPB (0 mEq Intravenous Stopped 09/18/23 1842)  lactated ringers bolus 1,000 mL (0 mLs Intravenous Stopped 09/18/23 1909)  iohexol (OMNIPAQUE) 300 MG/ML solution 100 mL (100 mLs Intravenous Contrast Given 09/18/23 1900)  ondansetron (ZOFRAN) injection 4 mg (4 mg Intravenous Given 09/18/23 1758)  morphine (PF) 2 MG/ML  injection 2 mg (2 mg Intravenous Given 09/18/23 1757)     IMPRESSION / MDM / ASSESSMENT AND PLAN / ED COURSE  I reviewed the triage vital signs and the nursing notes.                              Differential diagnosis includes, but is not limited to, colitis, C. difficile, GI bleed, diverticulitis, electrolyte derangements, dehydration.  Will get labs, EKG, GI PCR, C. difficile PCR, lactic acid.  Will give her some IV fluids.  Patient's presentation is most consistent with acute presentation with potential threat to life or bodily function.  Review of labs and imaging are in the chart.  Given her hypokalemia, hypomagnesemia, dehydration and persistent diarrhea, she will need to be admitted for further management.  Consult to hospitalist who will evaluate the patient.  She is admitted.  Clinical Course as of 09/18/23 2008  Tue Sep 18, 2023  1723 Independent review of labs, no leukocytosis, potassium is low, creatinine is normal, magnesium is also low, lipase is normal.  Will replete those electrolytes. [TT]  1851 Lactic Acid, Venous: 1.4 Not elevated [TT]    Clinical Course User Index [TT] Claybon Jabs, MD     FINAL CLINICAL IMPRESSION(S) / ED DIAGNOSES   Final diagnoses:  Diarrhea, unspecified type  Generalized abdominal pain  Hypokalemia  Hypomagnesemia  Nausea and vomiting, unspecified vomiting type  Dehydration  Prolonged QT interval     Rx / DC Orders   ED Discharge Orders     None        Note:  This document was prepared using Dragon voice recognition software and may include unintentional dictation errors.    Claybon Jabs, MD 09/18/23 2009

## 2023-09-18 NOTE — Telephone Encounter (Signed)
 The patient sister Dewayne Hatch) called in to speak to the nurse.

## 2023-09-18 NOTE — H&P (Signed)
 History and Physical    Patient: Abigail Ortega QIH:[id number] DOB: [birth year] DOA: 09/18/2023 DOS: the patient was seen and examined on 09/18/2023 PCP: Margaretann Loveless, MD  Patient coming from: Home  Chief Complaint:  Chief Complaint  Patient presents with   c diff   Diarrhea   Abdominal Pain    HPI: Abigail Ortega is a 73 y.o. female with medical history significant for CAD s/p DES, breast CA s/p chemorads and mastectomy, hypothyroidism, HTN, chronic hypotension on midodrine, and C. difficile colitis December 2024 treated with vancomycin, rehospitalized from 1/16 to 08/20/2023 with recurrent diarrhea ruled out for C. 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 It is associated with crampy abdominal pain,  nonbloody, none coffee-ground vomiting.  Denies fever, dysuria, chest pain or shortness of breath. ED course and data review: BP 102/57, low-grade temp of 99.2, pulse 74 Workup notable for the following: WBC 9 with lactic acid 1.4 Potassium 2.5 and magnesium 1.4 Creatinine normal at 1 Hemoglobin at baseline at 10.6 Lipase normal, LFTs for the most part normal Stool for C. difficile and GI PCR panel pending EKG with sinus rhythm at 89 and nonspecific T wave findings CT abdomen and pelvis with contrast showing diffuse colonic inflammatory change as follows IMPRESSION: Diffuse colonic inflammatory change consistent with the given clinical history of C difficile infection. No perforation or pericolonic abscess is noted.  Patient was given IV potassium and magnesium repletion and an LR bolus  Hospitalist consulted for admission.   Review of Systems: As mentioned in the history of present illness. All other  systems reviewed and are negative.  Past Medical History:  Diagnosis Date   Allergic rhinitis 12/31/2017   Anal fissure    Anal pain 01/13/2015   Anemia in chronic kidney disease 06/23/2019   Anemia in chronic kidney disease 06/23/2019   Anxiety    Anxiety disorder due to medical condition 04/02/2018   Arthritis    Breast cancer [facility]) 2014   Right breast cancer - chemo, radiation and Mastectomy   C. difficile diarrhea    CHF (congestive heart failure) (HCC) 2013   Chronic kidney disease    Clotting disorder (HCC)    blood clots   Depression    GERD (gastroesophageal reflux disease)    GI bleed    H/O heart artery stent    Headache    Heart attack (HCC) 2012   coronary stent x2 completed by Dr. Juliann Pares.   Heart attack (HCC) 04/29/2013   Hemorrhoids    Hypertension    Hypothyroidism    IBS (irritable bowel syndrome)    Malignant neoplasm of lower-inner quadrant of female breast (HCC) 01/20/2013   invasive mammary cancer, minimal 0.85 cm.  Histologic grade 1.   Persistent insomnia 04/02/2018   Personal history of chemotherapy 2014   BREAST CA   Personal history of radiation therapy 2014   BREAST CA   Secondary hyperparathyroidism of renal origin (HCC) 09/08/2020   Thyroid disease    Past Surgical History:  Procedure Laterality Date   BIOPSY  08/13/2023   Procedure: BIOPSY;  Surgeon: Midge Minium, MD;  Location: [facility] ENDOSCOPY;  Service: Endoscopy;;   BOTOX INJECTION N/A 03/14/2017   Procedure: BOTOX INJECTION;  Surgeon: Leafy Ro, MD;  Location: [facility] ORS;  Service: General;  Laterality: N/A;   BOTOX INJECTION N/A 05/09/2018   Procedure: BOTOX INJECTION;  Surgeon: Leafy Ro, MD;  Location: [facility] ORS;  Service: General;  Laterality: N/A;   BREAST BIOPSY Right 2014   positive   BREAST SURGERY Right 02-21-2013   right mastectomy   CARDIAC CATHETERIZATION     COLONOSCOPY W/ BIOPSIES  09/15/2010   Colonoscopy completed by Lutricia Feil, M.D. Tubular adenoma of the  ascending colon and descending colon reported up to 0.4 cm in diameter. No atypia.   COLONOSCOPY WITH PROPOFOL N/A 12/23/2016   Procedure: COLONOSCOPY WITH PROPOFOL;  Surgeon: Charlott Rakes, MD;  Location: [facility] ENDOSCOPY;  Service: Endoscopy;  Laterality: N/A;   ESOPHAGOGASTRODUODENOSCOPY N/A 12/13/2016   Procedure: ESOPHAGOGASTRODUODENOSCOPY (EGD);  Surgeon: Wyline Mood, MD;  Location[facility] ENDOSCOPY;  Service: Endoscopy;  Laterality: N/A;   ESOPHAGOGASTRODUODENOSCOPY (EGD) WITH PROPOFOL N/A 08/13/2023   Procedure: ESOPHAGOGASTRODUODENOSCOPY (EGD) WITH PROPOFOL;  Surgeon: Midge Minium, MD;  Location: [facility] ENDOSCOPY;  Service: Endoscopy;  Laterality: N/A;   FLEXIBLE SIGMOIDOSCOPY N/A 08/13/2023   Procedure: FLEXIBLE SIGMOIDOSCOPY;  Surgeon: Midge Minium, MD;  Location: [facility] ENDOSCOPY;  Service: Endoscopy;  Laterality: N/A;   LEFT HEART CATH N/A 04/02/2020   Procedure: Left Heart Cath with Coronary Angiography;  Surgeon: Laurier Nancy, MD;  Location: [facility] INVASIVE CV LAB;  Service: Cardiovascular;  Laterality: N/A;   LEFT HEART CATH AND CORONARY ANGIOGRAPHY Left 08/29/2018   Procedure: LEFT HEART CATH AND CORONARY ANGIOGRAPHY;  Surgeon: Laurier Nancy, MD;  Location: [facility] INVASIVE CV LAB;  Service: Cardiovascular;  Laterality: Left;   MASTECTOMY Right 2014   BREAST CA   PORTA CATH INSERTION     SPHINCTEROTOMY N/A 05/09/2018   Procedure: SPHINCTEROTOMY;  Surgeon: Leafy Ro, MD;  Location: [facility] ORS;  Service: General;  Laterality: N/A;   UPPER GI ENDOSCOPY  09/15/2010     Completed by Lutricia Feil, M.D. for nausea. Normal exam reported.   VASCULAR SURGERY     Social History:  reports that she quit smoking about 11 years ago. Her smoking use included cigarettes. She started smoking about 31 years ago. She has a 20 pack-year smoking history. She has never used smokeless tobacco. She reports that she does not drink alcohol and does not use drugs.  Allergies  Allergen Reactions   Nausea Control  [Emetrol] Nausea And Vomiting   Other     Note: burning, nausea with XR 75 mg   Effexor [Venlafaxine] Hives, Itching and Rash   Hydroxyzine Rash and Hives    Family History  Problem Relation Age of Onset   Breast cancer Mother 36   Cancer Father        colon    Prior to Admission medications   Medication Sig Start Date End Date Taking? Authorizing Provider  acetaminophen (TYLENOL) 650 MG CR tablet Take 1,300 mg by mouth every 8 (eight) hours as needed for pain.    [provider]  aspirin EC 81 MG tablet Take 81 mg by mouth daily. 08/08/18   [provider]  atorvastatin (LIPITOR) 80 MG tablet TAKE 1 TABLET EVERY DAY 02/12/23   Laurier Nancy, MD  clopidogrel (PLAVIX) 75 MG tablet TAKE 1 TABLET EVERY DAY (NEED MD APPOINTMENT) 02/12/23   Laurier Nancy, MD  dicyclomine (BENTYL) 10 MG capsule Take 1 capsule (10 mg total) by mouth 3 (three) times daily before meals. 08/28/23   Margaretann Loveless, MD  ferrous sulfate 325 (65 FE) MG tablet Take 1  tablet (325 mg total) by mouth daily. 11/28/22 11/28/23  Orson Eva, NP  fexofenadine (ALLEGRA) 60 MG tablet Take 0.5 tablets (30 mg total) by mouth 2 (two) times daily as needed (allergies.). 05/24/23   Scoggins, Amber, NP  folic acid (FOLVITE) 1 MG tablet Take 1 tablet (1 mg total) by mouth daily. 08/21/23   Jonah Blue, MD  furosemide (LASIX) 20 MG tablet Take 1 tablet (20 mg total) by mouth daily as needed. 05/24/23   Scoggins, Hospital doctor, NP  hydrocortisone 2.5 % cream APPLY PEA SIZED TO RECTAL REGION EVERY 8 HOURS AS NEEDED FOR RELIEF 11/06/22   Orson Eva, NP  isosorbide mononitrate (IMDUR) 120 MG 24 hr tablet TAKE 1 TABLET TWICE DAILY 02/12/23   Laurier Nancy, MD  levothyroxine (SYNTHROID) 88 MCG tablet Take 1 tablet (88 mcg total) by mouth daily before breakfast. 05/24/23   Scoggins, Amber, NP  lidocaine (LIDODERM) 5 % Place 1 patch onto the skin every 12 (twelve) hours. Remove & Discard patch within 12 hours or as directed by  MD 04/20/23 04/19/24  Cameron Ali, PA-C  loperamide (IMODIUM) 2 MG capsule Take 1 capsule (2 mg total) by mouth as needed for diarrhea or loose stools. 08/20/23   Jonah Blue, MD  midodrine (PROAMATINE) 10 MG tablet Take 10 mg by mouth 3 (three) times daily. 04/27/23   [provider]  montelukast (SINGULAIR) 10 MG tablet TAKE 1 TABLET AT BEDTIME 01/17/23   Miki Kins, FNP  Nystatin (GERHARDT'S BUTT CREAM) CREA Apply 1 Application topically 4 (four) times daily. 08/20/23   Jonah Blue, MD  ondansetron (ZOFRAN-ODT) 4 MG disintegrating tablet Take 1 tablet (4 mg total) by mouth every 6 (six) hours as needed for nausea or vomiting. 05/22/23   Ward, Layla Maw, DO  pantoprazole (PROTONIX) 40 MG tablet Take 1 tablet (40 mg total) by mouth daily. 05/22/23 05/21/24  Ward, Layla Maw, DO  polycarbophil (FIBERCON) 625 MG tablet Take 1 tablet (625 mg total) by mouth daily. 08/21/23   Jonah Blue, MD  ranolazine (RANEXA) 500 MG 12 hr tablet TAKE 1 TABLET TWICE DAILY 01/17/23   Laurier Nancy, MD  traZODone (DESYREL) 100 MG tablet Take 1 tablet (100 mg total) by mouth at bedtime. TAKE 1 TABLET BY MOUTH NIGHTLY AT BEDTIME AS NEEDED FOR SLEEP 05/24/23   Scoggins, Hospital doctor, NP    Physical Exam: Vitals:   09/18/23 1800 09/18/23 1830 09/18/23 1936 09/18/23 2109  BP: 110/77 132/72 (!) 145/72 137/74  Pulse:  82 90 84  Resp: 16 13 (!) 22 16  Temp:   98.2 F (36.8 C) 98.7 F (37.1 C)  TempSrc:   Oral   SpO2:  100% 98% 100%  Weight:      Height:       Physical Exam  Labs on Admission: I have personally reviewed following labs and imaging studies  CBC: Recent Labs  Lab 09/18/23 1522  WBC 9.0  HGB 10.6*  HCT 31.5*  MCV 91.8  PLT 440*   Basic Metabolic Panel: Recent Labs  Lab 09/18/23 1522  NA 137  K 2.5*  CL 97*  CO2 24  GLUCOSE 101*  BUN 10  CREATININE 1.00  CALCIUM 8.3*  MG 1.4*   GFR: Estimated Creatinine Clearance: 41.3 mL/min (by C-G formula based on SCr of 1  mg/dL). Liver Function Tests: Recent Labs  Lab 09/18/23 1522  AST 17  ALT 8  ALKPHOS 76  BILITOT 1.4*  PROT 6.1*  ALBUMIN 2.8*  Recent Labs  Lab 09/18/23 1522  LIPASE 20   No results for input(s): "AMMONIA" in the last 168 hours. Coagulation Profile: No results for input(s): "INR", "PROTIME" in the last 168 hours. Cardiac Enzymes: No results for input(s): "CKTOTAL", "CKMB", "CKMBINDEX", "TROPONINI" in the last 168 hours. BNP (last 3 results) No results for input(s): "PROBNP" in the last 8760 hours. HbA1C: No results for input(s): "HGBA1C" in the last 72 hours. CBG: No results for input(s): "GLUCAP" in the last 168 hours. Lipid Profile: No results for input(s): "CHOL", "HDL", "LDLCALC", "TRIG", "CHOLHDL", "LDLDIRECT" in the last 72 hours. Thyroid Function Tests: No results for input(s): "TSH", "T4TOTAL", "FREET4", "T3FREE", "THYROIDAB" in the last 72 hours. Anemia Panel: No results for input(s): "VITAMINB12", "FOLATE", "FERRITIN", "TIBC", "IRON", "RETICCTPCT" in the last 72 hours. Urine analysis:    Component Value Date/Time   COLORURINE AMBER (A) 05/22/2023 0325   APPEARANCEUR HAZY (A) 05/22/2023 0325   APPEARANCEUR Clear 02/26/2014 1546   LABSPEC 1.040 (H) 05/22/2023 0325   LABSPEC 1.012 02/26/2014 1546   PHURINE 5.0 05/22/2023 0325   GLUCOSEU NEGATIVE 05/22/2023 0325   GLUCOSEU Negative 02/26/2014 1546   HGBUR NEGATIVE 05/22/2023 0325   BILIRUBINUR NEGATIVE 05/22/2023 0325   BILIRUBINUR Negative 02/26/2014 1546   KETONESUR NEGATIVE 05/22/2023 0325   PROTEINUR 100 (A) 05/22/2023 0325   NITRITE NEGATIVE 05/22/2023 0325   LEUKOCYTESUR SMALL (A) 05/22/2023 0325   LEUKOCYTESUR 1+ 02/26/2014 1546    Radiological Exams on Admission: CT ABDOMEN PELVIS W CONTRAST Result Date: 09/18/2023 CLINICAL DATA:  Acute abdominal pain and known C difficile infection with increasing diarrhea, initial encounter EXAM: CT ABDOMEN AND PELVIS WITH CONTRAST TECHNIQUE: Multidetector CT  imaging of the abdomen and pelvis was performed using the standard protocol following bolus administration of intravenous contrast. RADIATION DOSE REDUCTION: This exam was performed according to the departmental dose-optimization program which includes automated exposure control, adjustment of the mA and/or kV according to patient size and/or use of iterative reconstruction technique. CONTRAST:  OMNIPAQUE IOHEXOL 300 MG/ML  SOLN COMPARISON:  08/15/2023 FINDINGS: Lower chest: No acute abnormality. Hepatobiliary: No focal liver abnormality is seen. No gallstones, gallbladder wall thickening, or biliary dilatation. Pancreas: Unremarkable. No pancreatic ductal dilatation or surrounding inflammatory changes. Spleen: Normal in size without focal abnormality. Adrenals/Urinary Tract: Adrenal glands are within normal limits. Kidneys demonstrate a normal enhancement pattern bilaterally. No renal calculi or obstructive changes are noted. The bladder is partially distended. Stomach/Bowel: Wall thickening is noted throughout the colon and rectum increased when compared with the prior exam consistent with diffuse colitis. This corresponds with the patient's given clinical history. The appendix is not well visualized. No inflammatory changes are noted to suggest appendicitis. Small bowel and stomach are within normal limits. Vascular/Lymphatic: Aortic atherosclerosis. No enlarged abdominal or pelvic lymph nodes. Reproductive: Uterus and bilateral adnexa are unremarkable. Other: No abdominal wall hernia or abnormality. No abdominopelvic ascites. Musculoskeletal: No acute or significant osseous findings. IMPRESSION: Diffuse colonic inflammatory change consistent with the given clinical history of C difficile infection. No perforation or pericolonic abscess is noted. Electronically Signed   By: Alcide Clever M.D.   On: 09/18/2023 21:08     Data Reviewed: Relevant notes from primary care and specialist visits, past discharge  summaries as available in EHR, including Care Everywhere. Prior diagnostic testing as pertinent to current admission diagnoses Updated medications and problem lists for reconciliation ED course, including vitals, labs, imaging, treatment and response to treatment Triage notes, nursing and pharmacy notes and ED provider's notes  Notable results as noted in HPI   Assessment and Plan: * Diarrhea, recurrent History of C. difficile colitis 06/2023 and postinfectious IBS 07/2023 History of colonoscopy with normal biopsy 08/13/2023 follow GI PCR panel and stool for C. difficile to evaluate for infectious colitis Clear liquid diet Bentyl for abdominal cramps IV hydration and replace electrolytes Consider GI consult if stool studies unrevealing  Hypokalemia Hypomagnesemia Secondary to diarrhea Was repleted in the ED Continue to monitor and correct as needed  Lung nodule CT on 1/29 also showed an enlarging subpleural nodule in the anterior RML that is concerning for lung cancer vs. Met She will need outpatient oncology f/u for PET scan  Hypothyroidism Stable Continue Synthroid   Chronic hypotension Stable Continue midodrine   Coronary disease Stable Continue ASA and Plavix and monitor for bleeding Continue Imdur, Ranexa   HLD Continue atorvastatin   Chronic systolic and diastolic congestive heart failure  Appeared hypovolemic secondary to intractable diarrhea  holding Lasix   DVT prophylaxis: SCD  Consults: none  Advance Care Planning:   Code Status: Full Code   Family Communication: none  Disposition Plan: Back to previous home environment  Severity of Illness: The appropriate patient status for this patient is OBSERVATION. Observation status is judged to be reasonable and necessary in order to provide the required intensity of service to ensure the patient's safety. The patient's presenting symptoms, physical exam findings, and initial radiographic and laboratory data  in the context of their medical condition is felt to place them at decreased risk for further clinical deterioration. Furthermore, it is anticipated that the patient will be medically stable for discharge from the hospital within 2 midnights of admission.   Author: Andris Baumann, MD 09/18/2023 11:03 PM  For on call review [email or web address].

## 2023-09-18 NOTE — Assessment & Plan Note (Signed)
 Hypomagnesemia Secondary to diarrhea Was repleted in the ED Continue to monitor and correct as needed

## 2023-09-18 NOTE — Hospital Course (Signed)
 Marland Kitchen

## 2023-09-18 NOTE — ED Triage Notes (Signed)
 Pt to ED with family member for ongoing c diff infection, diarrhea worse again since 4 days ago. Has been to hospital twice for same. Continues to have watery diarrhea and is having abdominal pain (generalized), better with BM. Pt unsure how many BM per day, wears adult brief. Poor appetite.

## 2023-09-18 NOTE — Assessment & Plan Note (Addendum)
 History of C. difficile colitis 06/2023 and postinfectious IBS 07/2023 History of colonoscopy with normal biopsy 08/13/2023 follow GI PCR panel and stool for C. difficile to evaluate for infectious colitis Clear liquid diet Bentyl for abdominal cramps IV hydration and replace electrolytes Consider GI consult if stool studies unrevealing

## 2023-09-18 NOTE — ED Notes (Signed)
 Pt requesting to be changed, only a small amount of stool noted in diaper, not enough for stool sample. Clean diaper placed on pt

## 2023-09-19 ENCOUNTER — Telehealth: Payer: Self-pay

## 2023-09-19 DIAGNOSIS — W1830XA Fall on same level, unspecified, initial encounter: Secondary | ICD-10-CM | POA: Diagnosis not present

## 2023-09-19 DIAGNOSIS — D75838 Other thrombocytosis: Secondary | ICD-10-CM | POA: Diagnosis present

## 2023-09-19 DIAGNOSIS — E860 Dehydration: Secondary | ICD-10-CM | POA: Diagnosis present

## 2023-09-19 DIAGNOSIS — N2581 Secondary hyperparathyroidism of renal origin: Secondary | ICD-10-CM | POA: Diagnosis present

## 2023-09-19 DIAGNOSIS — N189 Chronic kidney disease, unspecified: Secondary | ICD-10-CM | POA: Diagnosis present

## 2023-09-19 DIAGNOSIS — E039 Hypothyroidism, unspecified: Secondary | ICD-10-CM | POA: Diagnosis present

## 2023-09-19 DIAGNOSIS — D638 Anemia in other chronic diseases classified elsewhere: Secondary | ICD-10-CM | POA: Diagnosis not present

## 2023-09-19 DIAGNOSIS — A0472 Enterocolitis due to Clostridium difficile, not specified as recurrent: Secondary | ICD-10-CM | POA: Diagnosis not present

## 2023-09-19 DIAGNOSIS — I5042 Chronic combined systolic (congestive) and diastolic (congestive) heart failure: Secondary | ICD-10-CM | POA: Diagnosis present

## 2023-09-19 DIAGNOSIS — A09 Infectious gastroenteritis and colitis, unspecified: Secondary | ICD-10-CM | POA: Diagnosis not present

## 2023-09-19 DIAGNOSIS — R197 Diarrhea, unspecified: Secondary | ICD-10-CM | POA: Diagnosis present

## 2023-09-19 DIAGNOSIS — D631 Anemia in chronic kidney disease: Secondary | ICD-10-CM | POA: Diagnosis present

## 2023-09-19 DIAGNOSIS — E785 Hyperlipidemia, unspecified: Secondary | ICD-10-CM | POA: Diagnosis present

## 2023-09-19 DIAGNOSIS — Z9011 Acquired absence of right breast and nipple: Secondary | ICD-10-CM | POA: Diagnosis not present

## 2023-09-19 DIAGNOSIS — J449 Chronic obstructive pulmonary disease, unspecified: Secondary | ICD-10-CM | POA: Diagnosis present

## 2023-09-19 DIAGNOSIS — E872 Acidosis, unspecified: Secondary | ICD-10-CM | POA: Diagnosis present

## 2023-09-19 DIAGNOSIS — E871 Hypo-osmolality and hyponatremia: Secondary | ICD-10-CM | POA: Diagnosis present

## 2023-09-19 DIAGNOSIS — Y92230 Patient room in hospital as the place of occurrence of the external cause: Secondary | ICD-10-CM | POA: Diagnosis not present

## 2023-09-19 DIAGNOSIS — R509 Fever, unspecified: Secondary | ICD-10-CM | POA: Diagnosis not present

## 2023-09-19 DIAGNOSIS — R109 Unspecified abdominal pain: Secondary | ICD-10-CM | POA: Diagnosis not present

## 2023-09-19 DIAGNOSIS — Z7902 Long term (current) use of antithrombotics/antiplatelets: Secondary | ICD-10-CM | POA: Diagnosis not present

## 2023-09-19 DIAGNOSIS — I2510 Atherosclerotic heart disease of native coronary artery without angina pectoris: Secondary | ICD-10-CM | POA: Diagnosis present

## 2023-09-19 DIAGNOSIS — Z87891 Personal history of nicotine dependence: Secondary | ICD-10-CM | POA: Diagnosis not present

## 2023-09-19 DIAGNOSIS — Z7982 Long term (current) use of aspirin: Secondary | ICD-10-CM | POA: Diagnosis not present

## 2023-09-19 DIAGNOSIS — Z9221 Personal history of antineoplastic chemotherapy: Secondary | ICD-10-CM | POA: Diagnosis not present

## 2023-09-19 DIAGNOSIS — E876 Hypokalemia: Secondary | ICD-10-CM | POA: Diagnosis present

## 2023-09-19 DIAGNOSIS — Z955 Presence of coronary angioplasty implant and graft: Secondary | ICD-10-CM | POA: Diagnosis not present

## 2023-09-19 DIAGNOSIS — A0471 Enterocolitis due to Clostridium difficile, recurrent: Secondary | ICD-10-CM | POA: Diagnosis present

## 2023-09-19 DIAGNOSIS — Z923 Personal history of irradiation: Secondary | ICD-10-CM | POA: Diagnosis not present

## 2023-09-19 DIAGNOSIS — I130 Hypertensive heart and chronic kidney disease with heart failure and stage 1 through stage 4 chronic kidney disease, or unspecified chronic kidney disease: Secondary | ICD-10-CM | POA: Diagnosis present

## 2023-09-19 LAB — BASIC METABOLIC PANEL
Anion gap: 11 (ref 5–15)
BUN: 9 mg/dL (ref 8–23)
CO2: 24 mmol/L (ref 22–32)
Calcium: 7.3 mg/dL — ABNORMAL LOW (ref 8.9–10.3)
Chloride: 99 mmol/L (ref 98–111)
Creatinine, Ser: 0.79 mg/dL (ref 0.44–1.00)
GFR, Estimated: >60 mL/min (ref >60)
Glucose, Bld: 75 mg/dL (ref 70–99)
Potassium: 2.2 mmol/L — CL (ref 3.5–5.1)
Sodium: 134 mmol/L — ABNORMAL LOW (ref 135–145)

## 2023-09-19 LAB — CBC
HCT: 23.7 % — ABNORMAL LOW (ref 36.0–46.0)
Hemoglobin: 8.2 g/dL — ABNORMAL LOW (ref 12.0–15.0)
MCH: 31.1 pg (ref 26.0–34.0)
MCHC: 34.6 g/dL (ref 30.0–36.0)
MCV: 89.8 fL (ref 80.0–100.0)
Platelets: 371 10*3/uL (ref 150–400)
RBC: 2.64 MIL/uL — ABNORMAL LOW (ref 3.87–5.11)
RDW: 15.3 % (ref 11.5–15.5)
WBC: 7 10*3/uL (ref 4.0–10.5)
nRBC: 0 % (ref 0.0–0.2)

## 2023-09-19 LAB — C DIFFICILE QUICK SCREEN W PCR REFLEX
C Diff antigen: POSITIVE — AB
C Diff interpretation: DETECTED
C Diff toxin: POSITIVE — AB

## 2023-09-19 LAB — GASTROINTESTINAL PANEL BY PCR, STOOL (REPLACES STOOL CULTURE)

## 2023-09-19 LAB — MAGNESIUM: Magnesium: 1.8 mg/dL (ref 1.7–2.4)

## 2023-09-19 LAB — POTASSIUM: Potassium: 3.4 mmol/L — ABNORMAL LOW (ref 3.5–5.1)

## 2023-09-19 MED ORDER — POTASSIUM CHLORIDE 10 MEQ/100ML IV SOLN
10.0000 meq | INTRAVENOUS | Status: AC
  Administered 2023-09-19 (×4): 10 meq via INTRAVENOUS
  Filled 2023-09-19 (×4): qty 100

## 2023-09-19 MED ORDER — POTASSIUM CHLORIDE 20 MEQ PO PACK
40.0000 meq | PACK | Freq: Once | ORAL | Status: AC
  Administered 2023-09-19: 40 meq via ORAL
  Filled 2023-09-19: qty 2

## 2023-09-19 MED ORDER — POTASSIUM CHLORIDE 10 MEQ/100ML IV SOLN
10.0000 meq | INTRAVENOUS | Status: DC
  Administered 2023-09-19 (×2): 10 meq via INTRAVENOUS
  Filled 2023-09-19 (×2): qty 100

## 2023-09-19 MED ORDER — FIDAXOMICIN 200 MG PO TABS
200.0000 mg | ORAL_TABLET | Freq: Two times a day (BID) | ORAL | Status: AC
  Administered 2023-09-19 – 2023-09-28 (×20): 200 mg via ORAL
  Filled 2023-09-19 (×22): qty 1

## 2023-09-19 MED ORDER — SODIUM CHLORIDE 0.9 % IV BOLUS
500.0000 mL | Freq: Once | INTRAVENOUS | Status: AC
  Administered 2023-09-19: 500 mL via INTRAVENOUS

## 2023-09-19 NOTE — Plan of Care (Signed)

## 2023-09-19 NOTE — Hospital Course (Signed)
 Abigail Ortega is a 73 y.o. female with medical history significant for CAD s/p DES, breast CA s/p chemorads and mastectomy, hypothyroidism, HTN, chronic hypotension on midodrine, and C. difficile colitis December 2024 treated with vancomycin, rehospitalized from 1/16 to 08/20/2023 with recurrent diarrhea ruled out for C. 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  Stool study positive for C. difficile antigen and toxin.  Dificid was started.

## 2023-09-19 NOTE — Telephone Encounter (Signed)
 Called patient to schedule an appointment -patient sister answered and when asked to speak with Abigail Ortega she said her condition worsen and they admitted her to to hospital yesterday- I asked her to have the patient call and schedule a follow up appointment when released from the hospital.

## 2023-09-19 NOTE — Progress Notes (Signed)
Notified MD

## 2023-09-19 NOTE — Progress Notes (Signed)
  Progress Note   Patient: Abigail Ortega TGG:[id number] DOB: 12-Feb-1951 DOA: 09/18/2023     0 DOS: the patient was seen and examined on 09/19/2023   Brief hospital course: Abigail Ortega is a 74 y.o. female with medical history significant for CAD s/p DES, breast CA s/p chemorads and mastectomy, hypothyroidism, HTN, chronic hypotension on midodrine, and C. difficile colitis December 2024 treated with vancomycin, rehospitalized from 1/16 to 08/20/2023 with recurrent diarrhea ruled out for C. 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  Stool study positive for C. difficile antigen and toxin.  Dificid was started.   Principal Problem:   Diarrhea, recurrent Active Problems:   History of Clostridioides difficile colitis 06/2023   Hypokalemia   Hypomagnesemia   CAD (coronary artery disease)   Breast cancer of lower-inner quadrant of right female breast (HCC)   Depression   Other irritable bowel syndrome   Chronic obstructive lung disease (HCC)   C. difficile colitis   Assessment and Plan: Recurrent C. difficile colitis. Patient had a transient hypotension due to dehydration from diarrhea.  Received IV fluids, blood pressure is better. Stool study came back positive for C. difficile pathogen as well as toxin.  This is the second time patient has C. difficile colitis diagnosed.  Patient be treated with Dificid for 10 days.   Hypokalemia Hypomagnesemia Hyponatremia. Potassium dropped down to 2.2 today, give 6 packets of KCl IV and 2 doses of oral.  Recheck potassium today and tomorrow.  Magnesium has normalized.  Also developed mild hyponatremia.  Anemia of chronic disease  Reactive thrombocytosis. Recent iron study  and B12 study are within normal limits.  Platelets has normalized.  Continue monitor hemoglobin and transfuse as needed, no additional rectal bleeding.  Chronic hypotension. Essential hypertension. Continue midodrine, blood pressure still borderline low.  COPD. Stable,  Coronary artery disease  Stable.    Subjective:  Patient still have significant diarrhea, no nausea vomiting.  Physical Exam: Vitals:   09/19/23 0511 09/19/23 0611 09/19/23 0707 09/19/23 0915  BP: (!) 81/55 (!) 93/54 93/61 106/68  Pulse: (!) 103 84 91 72  Resp: 16  20 16   Temp: 99.4 F (37.4 C) 98.4 F (36.9 C) 98.5 F (36.9 C) 98.6 F (37 C)  TempSrc: Oral Oral Oral Oral  SpO2: 100% 97% 98% 99%  Weight:      Height:       General exam: Appears calm and comfortable  Respiratory system: Clear to auscultation. Respiratory effort normal. Cardiovascular system: S1 & S2 heard, RRR. No JVD, murmurs, rubs, gallops or clicks. No pedal edema. Gastrointestinal system: Abdomen is nondistended, soft and nontender. No organomegaly or masses felt. Normal bowel sounds heard. Central nervous system: Alert and oriented. No focal neurological deficits. Extremities: Symmetric 5 x 5 power. Skin: No rashes, lesions or ulcers Psychiatry: Judgement and insight appear normal. Mood & affect appropriate.    Data Reviewed:  CT scan results and lab results reviewed.  Family Communication: Son updated over the phone.  Disposition: Status is: Inpatient Remains inpatient appropriate because: Severity of disease, IV treatment.     Time spent: 50 minutes  Author: Marrion Coy, MD 09/19/2023 2:04 PM  For on call review [email or web address].

## 2023-09-19 NOTE — TOC CM/SW Note (Addendum)
 Transition of Care [facility]) - Inpatient Brief Assessment   Patient Details  Name: Abigail Ortega MRN: [id number] Date of Birth: 06/26/1951  Transition of Care [facility]) CM/SW Contact:    Chapman Fitch, RN Phone Number: 09/19/2023, 9:54 AM   Clinical Narrative:   Transition of Care [facility]) Screening Note   Patient Details  Name: Abigail Ortega Date of Birth: 08-19-1950   Transition of Care [facility]) CM/SW Contact:    Chapman Fitch, RN Phone Number: 09/19/2023, 9:54 AM    Transition of Care Department [facility]) has reviewed patient and no TOC needs have been identified at this time.  If new patient transition needs arise, please place a TOC consult.  Per bamboo ping patient is active with [facility].  Message sent to [place] with [facility] to determine what services she is active with  Update:  Active with PT and OT through [facility]    Transition of Care Asessment: Insurance and Status: Insurance coverage has been reviewed Patient has primary care physician: Yes     Prior/Current Home Services: Current home services Social Drivers of Health Review: SDOH reviewed no interventions necessary Readmission risk has been reviewed:  (Patient in obs.  readmission score does not generate) Transition of care needs: no transition of care needs at this time

## 2023-09-20 ENCOUNTER — Telehealth (HOSPITAL_COMMUNITY): Payer: Self-pay | Admitting: Pharmacy Technician

## 2023-09-20 ENCOUNTER — Inpatient Hospital Stay

## 2023-09-20 ENCOUNTER — Other Ambulatory Visit (HOSPITAL_COMMUNITY): Payer: Self-pay

## 2023-09-20 DIAGNOSIS — R509 Fever, unspecified: Secondary | ICD-10-CM | POA: Diagnosis not present

## 2023-09-20 DIAGNOSIS — E876 Hypokalemia: Secondary | ICD-10-CM | POA: Diagnosis not present

## 2023-09-20 DIAGNOSIS — D638 Anemia in other chronic diseases classified elsewhere: Secondary | ICD-10-CM | POA: Diagnosis not present

## 2023-09-20 DIAGNOSIS — A0472 Enterocolitis due to Clostridium difficile, not specified as recurrent: Secondary | ICD-10-CM | POA: Diagnosis not present

## 2023-09-20 LAB — CBC
HCT: 23 % — ABNORMAL LOW (ref 36.0–46.0)
Hemoglobin: 7.8 g/dL — ABNORMAL LOW (ref 12.0–15.0)
MCH: 31.1 pg (ref 26.0–34.0)
MCHC: 33.9 g/dL (ref 30.0–36.0)
MCV: 91.6 fL (ref 80.0–100.0)
Platelets: 384 10*3/uL (ref 150–400)
RBC: 2.51 MIL/uL — ABNORMAL LOW (ref 3.87–5.11)
RDW: 15.1 % (ref 11.5–15.5)
WBC: 6.8 10*3/uL (ref 4.0–10.5)
nRBC: 0 % (ref 0.0–0.2)

## 2023-09-20 LAB — BASIC METABOLIC PANEL
Anion gap: 9 (ref 5–15)
BUN: 9 mg/dL (ref 8–23)
CO2: 23 mmol/L (ref 22–32)
Calcium: 7.3 mg/dL — ABNORMAL LOW (ref 8.9–10.3)
Chloride: 103 mmol/L (ref 98–111)
Creatinine, Ser: 0.88 mg/dL (ref 0.44–1.00)
GFR, Estimated: >60 mL/min (ref >60)
Glucose, Bld: 92 mg/dL (ref 70–99)
Potassium: 3.1 mmol/L — ABNORMAL LOW (ref 3.5–5.1)
Sodium: 135 mmol/L (ref 135–145)

## 2023-09-20 LAB — PHOSPHORUS: Phosphorus: <1 mg/dL — CL (ref 2.5–4.6)

## 2023-09-20 LAB — MAGNESIUM: Magnesium: 1.8 mg/dL (ref 1.7–2.4)

## 2023-09-20 MED ORDER — POTASSIUM PHOSPHATES 15 MMOLE/5ML IV SOLN
30.0000 mmol | Freq: Once | INTRAVENOUS | Status: AC
  Administered 2023-09-20: 30 mmol via INTRAVENOUS
  Filled 2023-09-20: qty 10

## 2023-09-20 MED ORDER — POTASSIUM CHLORIDE 10 MEQ/100ML IV SOLN
10.0000 meq | INTRAVENOUS | Status: AC
  Administered 2023-09-20 (×3): 10 meq via INTRAVENOUS
  Filled 2023-09-20 (×3): qty 100

## 2023-09-20 MED ORDER — POTASSIUM CHLORIDE CRYS ER 20 MEQ PO TBCR
40.0000 meq | EXTENDED_RELEASE_TABLET | Freq: Once | ORAL | Status: AC
  Administered 2023-09-20: 40 meq via ORAL
  Filled 2023-09-20: qty 2

## 2023-09-20 MED ORDER — POTASSIUM PHOSPHATES 15 MMOLE/5ML IV SOLN
30.0000 mmol | Freq: Once | INTRAVENOUS | Status: DC
  Filled 2023-09-20: qty 10

## 2023-09-20 NOTE — Progress Notes (Signed)
 Progress Note   Patient: Abigail Ortega DOB: 1951-04-12 DOA: 09/18/2023     1 DOS: the patient was seen and examined on 09/20/2023   Brief hospital course: Abigail Ortega is a 73 y.o. female with medical history significant for CAD s/p DES, breast CA s/p chemorads and mastectomy, hypothyroidism, HTN, chronic hypotension on midodrine, and C. difficile colitis December 2024 treated with vancomycin, rehospitalized from 1/16 to 08/20/2023 with recurrent diarrhea ruled out for C. 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  Stool study positive for C. difficile antigen and toxin.  Dificid was started.   Principal Problem:   Diarrhea, recurrent Active Problems:   History of Clostridioides difficile colitis 06/2023   Hypokalemia   Hypomagnesemia   CAD (coronary artery disease)   Breast cancer of lower-inner quadrant of right female breast (HCC)   Depression   Other irritable bowel syndrome   Chronic obstructive lung disease (HCC)   C. difficile colitis   Assessment and Plan: Recurrent C. difficile colitis. Patient had a transient hypotension due to dehydration from diarrhea.  Received IV fluids, blood pressure is better. Stool study came back positive for C. difficile pathogen as well as toxin.  This is the second time patient has C. difficile colitis diagnosed.  Patient be treated with Dificid for 10 days. Patient had temperature 100.4 last night, but clinically improving, diarrhea is slowing down. Ordered KUB today, results pending, but I do not see any evidence of megacolon.  Continue current treatment.  Hypokalemia Hypomagnesemia Hyponatremia. Hypophosphatemia. Potassium at 3.1 today, still low.   Give IV potassium, also give 30 mmol of potassium phosphate.  Recheck levels tomorrow.  Patient probably will need more potassium and phosphate tomorrow   Anemia of chronic disease  Reactive thrombocytosis. Recent iron study and B12 study are within normal limits.  Platelets has normalized.  Hemoglobin has dropped down to 7.8 today, this is probably is secondary to acute illness.  Patient had small amount of bowel movements today, no black stool or bleeding.  Continue to follow, transfuse with hemoglobin less than 7.   Chronic hypotension. Essential hypertension. Pressures better on midodrine.  No need for blood pressure medicines.   COPD. Stable,   Coronary artery disease  Stable.      Subjective:  Patient feels better today, diarrhea is slowing down.  She is not aware that she had a temperature 100.4.  Physical Exam: Vitals:   09/19/23 1935 09/20/23 0318 09/20/23 0606 09/20/23 0740  BP: (!) 88/57 (!) 96/52 107/66 111/73  Pulse: 92 (!) 101 97 96  Resp: 16 18 18 18   Temp: 98.7 F (37.1 C) (!) 100.4 F (38 C) 98.2 F (36.8 C) 98.1 F (36.7 C)  TempSrc: Oral Oral Oral   SpO2: 100% 97% 100% 100%  Weight:      Height:       General exam: Appears calm and comfortable  Respiratory system: Clear to auscultation. Respiratory effort normal. Cardiovascular system: S1 & S2 heard, RRR. No JVD, murmurs, rubs, gallops or clicks. No pedal edema. Gastrointestinal system: Abdomen is nondistended, soft and nontender. No organomegaly or masses felt. Normal bowel sounds heard. Central nervous system: Alert and oriented x3. No focal neurological deficits. Extremities: Symmetric 5 x 5 power. Skin: No rashes, lesions or  ulcers Psychiatry: Judgement and insight appear normal. Mood & affect appropriate.    Data Reviewed:  Abdominal film reviewed, lab results reviewed  Family Communication: None  Disposition: Status is: Inpatient Remains inpatient appropriate because: Severity of  disease     Time spent: 35 minutes  Author: Marrion Coy, MD 09/20/2023 11:46 AM  For on call review [email or web address].

## 2023-09-20 NOTE — Telephone Encounter (Signed)
 Patient Product/process development scientist completed.    The patient is insured through [employer]. Patient has Medicare and is not eligible for a copay card, but may be able to apply for patient assistance or Medicare RX Payment Plan (Patient Must reach out to their plan, if eligible for payment plan), if available.    Ran test claim for Dificid 200 mg and the current 10 day co-pay is $12.15.   This test claim was processed through [facility]- copay amounts may vary at other pharmacies due to pharmacy/plan contracts, or as the patient moves through the different stages of their insurance plan.     Roland Earl, CPHT Pharmacy Technician III Certified Patient Advocate [facility] Patient Advocate Team Direct Number: [phone]  Fax: [phone]

## 2023-09-20 NOTE — Evaluation (Signed)
 Physical Therapy Evaluation Patient Details Name: HAVA MASSINGALE MRN: [id number] DOB: 1951/06/22 Today's Date: 09/20/2023  History of Present Illness  Pt is a 73 yo female that presented to ED for watery, bloody diarrhea, workup positive for cdiff. Noted to have had recent hospitalizations (dec 2024 and 08/02/2023-08/20/2023) for similar concerns, PMH of  CAD s/p DES, breast CA s/p chemorads and mastectomy, hypothyroidism, HTN, chronic hypotension on midodrine, COPD.   Clinical Impression  Pt alert, oriented, but appears labile. Varies between joking with therapy and disrespectful comments. Per pt she lives at home and uses her "scooter" (rollator). Per chart review pt lives with family with 24/7 availability.   With maximum encouragement, pt agreeable to OOB to [facility] and recliner for breakfast. MinA to come to sitting EOB. Able to transfer to [facility] stand pivot with handheld assist, and perform pericare in sitting. maxA in standing to ensure clealiness, and step pivoted to recliner with handheld assist, CGA. Pt set up with breakfast, needs in reach.  Overall the patient demonstrated deficits (see "PT Problem List") that impede the patient's functional abilities, safety, and mobility and would benefit from skilled PT intervention.          If plan is discharge home, recommend the following: A little help with walking and/or transfers;A little help with bathing/dressing/bathroom;Assistance with cooking/housework;Supervision due to cognitive status;Assist for transportation;Help with stairs or ramp for entrance;Direct supervision/assist for medications management   Can travel by private vehicle        Equipment Recommendations Other (comment);None recommended by PT (no DME needs, pt has rollator at home)  Recommendations for Other Services       Functional Status Assessment Patient has had a recent decline in their functional status and demonstrates the ability to make significant improvements in  function in a reasonable and predictable amount of time.     Precautions / Restrictions Precautions Precautions: Fall Restrictions Weight Bearing Restrictions Per Provider Order: No      Mobility  Bed Mobility Overal bed mobility: Needs Assistance Bed Mobility: Supine to Sit     Supine to sit: Min assist     General bed mobility comments: light minA to scoot to EOB, pt reaches for hand support    Transfers Overall transfer level: Needs assistance Equipment used: 1 person hand held assist Transfers: Sit to/from Stand, Bed to chair/wheelchair/BSC Sit to Stand: Contact guard assist   Step pivot transfers: Contact guard assist            Ambulation/Gait               General Gait Details: pt declined any further mobility  Stairs            Wheelchair Mobility     Tilt Bed    Modified Rankin (Stroke Patients Only)       Balance Overall balance assessment: Needs assistance Sitting-balance support: Feet supported Sitting balance-Leahy Scale: Good Sitting balance - Comments: able to perform some pericare in sitting   Standing balance support: Single extremity supported, Bilateral upper extremity supported Standing balance-Leahy Scale: Fair Standing balance comment: reaches for external support, uses her "scooter" at home (rollator)                             Pertinent Vitals/Pain Pain Assessment Pain Assessment: Faces Faces Pain Scale: Hurts even more Pain Location: intermittent abdominal pain Pain Descriptors / Indicators: Grimacing, Guarding, Moaning Pain Intervention(s): Limited activity within patient's tolerance,  Monitored during session, Repositioned    Home Living Family/patient expects to be discharged to:: Private residence Living Arrangements: Other relatives Available Help at Discharge: Family;Available 24 hours/day Type of Home: House Home Access: Stairs to enter Entrance Stairs-Rails: Right;Left;Can reach  both Entrance Stairs-Number of Steps: 3-4   Home Layout: One level Home Equipment: Agricultural consultant (2 wheels);Cane - single point;Wheelchair - manual;Shower seat;Grab bars - tub/shower Additional Comments: Duplex    Prior Function Prior Level of Function : Needs assist       Physical Assist : Mobility (physical);ADLs (physical)             Extremity/Trunk Assessment   Upper Extremity Assessment Upper Extremity Assessment: Generalized weakness    Lower Extremity Assessment Lower Extremity Assessment: Generalized weakness       Communication        Cognition Arousal: Alert Behavior During Therapy: Lability   PT - Cognitive impairments: No apparent impairments                       PT - Cognition Comments: pt oriented, but labile; flucuating between joking with therapy to being disrespectful. follows all commands sometimes with extra time due to pt motivation         Cueing       General Comments      Exercises     Assessment/Plan    PT Assessment Patient needs continued PT services  PT Problem List Decreased strength;Decreased activity tolerance;Decreased balance;Decreased mobility       PT Treatment Interventions DME instruction;Balance training;Gait training;Neuromuscular re-education;Stair training;Functional mobility training;Patient/family education;Therapeutic activities;Therapeutic exercise    PT Goals (Current goals can be found in the Care Plan section)  Acute Rehab PT Goals Patient Stated Goal: to feel better PT Goal Formulation: With patient Time For Goal Achievement: 10/04/23 Potential to Achieve Goals: Good    Frequency Min 1X/week     Co-evaluation PT/OT/SLP Co-Evaluation/Treatment: Yes Reason for Co-Treatment: To address functional/ADL transfers;Necessary to address cognition/behavior during functional activity PT goals addressed during session: Mobility/safety with mobility;Balance OT goals addressed during session:  ADL's and self-care       AM-PAC PT "6 Clicks" Mobility  Outcome Measure Help needed turning from your back to your side while in a flat bed without using bedrails?: A Little Help needed moving from lying on your back to sitting on the side of a flat bed without using bedrails?: A Little Help needed moving to and from a bed to a chair (including a wheelchair)?: A Little Help needed standing up from a chair using your arms (e.g., wheelchair or bedside chair)?: A Little Help needed to walk in hospital room?: A Little Help needed climbing 3-5 steps with a railing? : A Little 6 Click Score: 18    End of Session   Activity Tolerance: Other (comment) (limited by frustration with staff, somewhat redirectable) Patient left: in chair;with call bell/phone within reach;with chair alarm set Nurse Communication: Mobility status PT Visit Diagnosis: Other abnormalities of gait and mobility (R26.89);Difficulty in walking, not elsewhere classified (R26.2);Muscle weakness (generalized) (M62.81)    Time: 1610-9604 PT Time Calculation (min) (ACUTE ONLY): 28 min   Charges:   PT Evaluation $PT Eval Low Complexity: 1 Low PT Treatments $Therapeutic Activity: 8-22 mins PT General Charges $$ ACUTE PT VISIT: 1 Visit        Olga Coaster PT, DPT 10:28 AM,09/20/23

## 2023-09-20 NOTE — Plan of Care (Signed)

## 2023-09-20 NOTE — Evaluation (Signed)
 Occupational Therapy Evaluation Patient Details Name: Abigail Ortega MRN: [id number] DOB: 06/19/1951 Today's Date: 09/20/2023   History of Present Illness   Pt is a 73 yo female that presented to ED for watery, bloody diarrhea, workup positive for cdiff. Noted to have had recent hospitalizations (dec 2024 and 08/02/2023-08/20/2023) for similar concerns, PMH of  CAD s/p DES, breast CA s/p chemorads and mastectomy, hypothyroidism, HTN, chronic hypotension on midodrine, COPD.     Clinical Impressions Pt was seen for PT/OT co-evaluation this date to maximize pt/therapist safety. PTA, pt was living at home with 24/7 assist from family. Pt has needed intermittent assist levels d/t continued hospitalizations and bouts of diarrhea causing weakness.   Pt presents to acute OT demonstrating impaired ADL performance and functional mobility 2/2 weakness, low activity tolerance, balance deficits. Pt currently requires Min A for bed mobility reaching for therapists hand to bring trunk upright and forward scoot. CGA for STS from EOB and for SPT to recliner. CGA for small steps to the recliner via HHA. Total assist for peri-care, pt able to perform anterior hygiene seated on BSC with SBA. Increased time to perform all tasks with max encouragement needed.  Pt would benefit from skilled OT services to address noted impairments and functional limitations (see below for any additional details) in order to maximize safety and independence while minimizing falls risk and caregiver burden. Do anticipate the need for follow up OT services upon acute hospital DC.      If plan is discharge home, recommend the following:   A little help with walking and/or transfers;A lot of help with bathing/dressing/bathroom;Assist for transportation;Assistance with cooking/housework;Help with stairs or ramp for entrance     Functional Status Assessment   Patient has had a recent decline in their functional status and demonstrates  the ability to make significant improvements in function in a reasonable and predictable amount of time.     Equipment Recommendations   None recommended by OT     Recommendations for Other Services         Precautions/Restrictions   Precautions Precautions: Fall Restrictions Weight Bearing Restrictions Per Provider Order: No     Mobility Bed Mobility Overal bed mobility: Needs Assistance Bed Mobility: Supine to Sit     Supine to sit: Min assist     General bed mobility comments: light minA to scoot to EOB, pt reaches for hand support    Transfers Overall transfer level: Needs assistance Equipment used: 1 person hand held assist Transfers: Sit to/from Stand, Bed to chair/wheelchair/BSC Sit to Stand: Contact guard assist     Step pivot transfers: Contact guard assist     General transfer comment: CGa via HHA for STS and SPT during session      Balance Overall balance assessment: Needs assistance Sitting-balance support: Feet supported Sitting balance-Leahy Scale: Good Sitting balance - Comments: able to perform some pericare in sitting   Standing balance support: Single extremity supported, Bilateral upper extremity supported Standing balance-Leahy Scale: Fair Standing balance comment: HHA and reaches for external support in standing                           ADL either performed or assessed with clinical judgement   ADL Overall ADL's : Needs assistance/impaired     Grooming: Wash/dry face;Sitting;Set up                   Toilet Transfer: Contact guard assist;BSC/3in1 Statistician Details (  indicate cue type and reason): HHA Toileting- Clothing Manipulation and Hygiene: Total assistance;Sit to/from stand               Vision         Perception         Praxis         Pertinent Vitals/Pain Pain Assessment Pain Assessment: Faces Faces Pain Scale: Hurts even more Pain Location: intermittent abdominal  pain Pain Descriptors / Indicators: Grimacing, Guarding, Moaning Pain Intervention(s): Monitored during session, Repositioned, Limited activity within patient's tolerance     Extremity/Trunk Assessment Upper Extremity Assessment Upper Extremity Assessment: Generalized weakness   Lower Extremity Assessment Lower Extremity Assessment: Generalized weakness       Communication Communication Communication: No apparent difficulties   Cognition Arousal: Alert Behavior During Therapy: Lability               OT - Cognition Comments: alert and oriented to person; increased time to perform all tasks typically complaining before doing anything                 Following commands: Impaired Following commands impaired: Follows one step commands with increased time     Cueing  General Comments   Cueing Techniques: Verbal cues;Tactile cues  soiled in bed; changed linens and assisted with peri-care   Exercises Other Exercises Other Exercises: Edu in role of OT in acute setting.   Shoulder Instructions      Home Living Family/patient expects to be discharged to:: Private residence Living Arrangements: Other relatives Available Help at Discharge: Family;Available 24 hours/day Type of Home: House Home Access: Stairs to enter [employer] of Steps: 3-4 Entrance Stairs-Rails: Right;Left;Can reach both Home Layout: One level     Bathroom Shower/Tub: Chief Strategy Officer: Handicapped height     Home Equipment: Agricultural consultant (2 wheels);Cane - single point;Wheelchair - manual;Shower seat;Grab bars - tub/shower   Additional Comments: Duplex      Prior Functioning/Environment Prior Level of Function : Needs assist       Physical Assist : Mobility (physical);ADLs (physical) Mobility (physical): Bed mobility;Transfers;Gait ADLs (physical): Grooming;Bathing;Dressing;Toileting Mobility Comments: Pt reports being independent with ambulation prior  to this hospitalization.  Per chart about 1 month ago pt reported having limited mobility lately (essentially bed bound with son or daughter providing care).  Pt reports making significant progress while at rehab but became weak when she returned home d/t diarrhea (requiring increased assist). ADLs Comments: no family present to confirm    OT Problem List: Decreased strength;Decreased activity tolerance;Impaired balance (sitting and/or standing);Decreased cognition   OT Treatment/Interventions: Self-care/ADL training;Therapeutic exercise;Therapeutic activities;DME and/or AE instruction;Patient/family education;Balance training      OT Goals(Current goals can be found in the care plan section)   Acute Rehab OT Goals Patient Stated Goal: return home OT Goal Formulation: With patient Time For Goal Achievement: 10/04/23 Potential to Achieve Goals: Good ADL Goals Pt Will Perform Lower Body Bathing: with contact guard assist;sit to/from stand;sitting/lateral leans Pt Will Perform Lower Body Dressing: with contact guard assist;sitting/lateral leans;sit to/from stand Pt Will Transfer to Toilet: with modified independence;ambulating;regular height toilet Pt Will Perform Toileting - Clothing Manipulation and hygiene: with supervision;sitting/lateral leans;sit to/from stand   OT Frequency:  Min 1X/week    Co-evaluation PT/OT/SLP Co-Evaluation/Treatment: Yes Reason for Co-Treatment: To address functional/ADL transfers;Necessary to address cognition/behavior during functional activity PT goals addressed during session: Mobility/safety with mobility;Balance OT goals addressed during session: ADL's and self-care      AM-PAC OT "  6 Clicks" Daily Activity     Outcome Measure Help from another person eating meals?: None Help from another person taking care of personal grooming?: A Little Help from another person toileting, which includes using toliet, bedpan, or urinal?: A Lot Help from another  person bathing (including washing, rinsing, drying)?: A Lot Help from another person to put on and taking off regular upper body clothing?: A Little Help from another person to put on and taking off regular lower body clothing?: A Lot 6 Click Score: 16   End of Session Nurse Communication: Mobility status  Activity Tolerance: Patient tolerated treatment well Patient left: in chair;with call bell/phone within reach;with chair alarm set  OT Visit Diagnosis: Other abnormalities of gait and mobility (R26.89);Muscle weakness (generalized) (M62.81)                Time: 4098-1191 OT Time Calculation (min): 28 min Charges:  OT General Charges $OT Visit: 1 Visit OT Evaluation $OT Eval Moderate Complexity: 1 Mod Maxwel Meadowcroft, OTR/L  09/20/23, 10:45 AM  Lerry Cordrey E Damen Windsor 09/20/2023, 10:41 AM

## 2023-09-20 NOTE — Telephone Encounter (Signed)
 Abigail Ortega, can you please schedule an appointment. Thank you.

## 2023-09-21 ENCOUNTER — Other Ambulatory Visit: Payer: Self-pay | Admitting: *Deleted

## 2023-09-21 DIAGNOSIS — E876 Hypokalemia: Secondary | ICD-10-CM | POA: Diagnosis not present

## 2023-09-21 DIAGNOSIS — R197 Diarrhea, unspecified: Secondary | ICD-10-CM

## 2023-09-21 DIAGNOSIS — D638 Anemia in other chronic diseases classified elsewhere: Secondary | ICD-10-CM | POA: Diagnosis not present

## 2023-09-21 DIAGNOSIS — A0472 Enterocolitis due to Clostridium difficile, not specified as recurrent: Secondary | ICD-10-CM | POA: Diagnosis not present

## 2023-09-21 LAB — BASIC METABOLIC PANEL
Anion gap: 8 (ref 5–15)
BUN: 7 mg/dL — ABNORMAL LOW (ref 8–23)
CO2: 21 mmol/L — ABNORMAL LOW (ref 22–32)
Calcium: 7.4 mg/dL — ABNORMAL LOW (ref 8.9–10.3)
Chloride: 106 mmol/L (ref 98–111)
Creatinine, Ser: 0.92 mg/dL (ref 0.44–1.00)
GFR, Estimated: >60 mL/min (ref >60)
Glucose, Bld: 88 mg/dL (ref 70–99)
Potassium: 3.6 mmol/L (ref 3.5–5.1)
Sodium: 135 mmol/L (ref 135–145)

## 2023-09-21 LAB — MAGNESIUM: Magnesium: 1.7 mg/dL (ref 1.7–2.4)

## 2023-09-21 LAB — CBC
HCT: 24.6 % — ABNORMAL LOW (ref 36.0–46.0)
Hemoglobin: 8.4 g/dL — ABNORMAL LOW (ref 12.0–15.0)
MCH: 31.1 pg (ref 26.0–34.0)
MCHC: 34.1 g/dL (ref 30.0–36.0)
MCV: 91.1 fL (ref 80.0–100.0)
Platelets: 413 10*3/uL — ABNORMAL HIGH (ref 150–400)
RBC: 2.7 MIL/uL — ABNORMAL LOW (ref 3.87–5.11)
RDW: 15.2 % (ref 11.5–15.5)
WBC: 7.3 10*3/uL (ref 4.0–10.5)
nRBC: 0 % (ref 0.0–0.2)

## 2023-09-21 LAB — PHOSPHORUS: Phosphorus: 2.1 mg/dL — ABNORMAL LOW (ref 2.5–4.6)

## 2023-09-21 MED ORDER — MAGNESIUM SULFATE 2 GM/50ML IV SOLN
2.0000 g | Freq: Once | INTRAVENOUS | Status: AC
  Administered 2023-09-21: 2 g via INTRAVENOUS
  Filled 2023-09-21: qty 50

## 2023-09-21 MED ORDER — POTASSIUM PHOSPHATES 15 MMOLE/5ML IV SOLN
15.0000 mmol | Freq: Once | INTRAVENOUS | Status: AC
  Administered 2023-09-21: 15 mmol via INTRAVENOUS
  Filled 2023-09-21: qty 5

## 2023-09-21 MED ORDER — PANCRELIPASE (LIP-PROT-AMYL) 12000-38000 UNITS PO CPEP
24000.0000 [IU] | ORAL_CAPSULE | Freq: Three times a day (TID) | ORAL | Status: DC
  Administered 2023-09-21 – 2023-10-05 (×43): 24000 [IU] via ORAL
  Filled 2023-09-21 (×42): qty 2

## 2023-09-21 MED ORDER — SODIUM BICARBONATE 650 MG PO TABS
650.0000 mg | ORAL_TABLET | Freq: Two times a day (BID) | ORAL | Status: DC
  Administered 2023-09-21 – 2023-09-26 (×12): 650 mg via ORAL
  Filled 2023-09-21 (×12): qty 1

## 2023-09-21 NOTE — Consult Note (Signed)
 [facility] Liaison Note  09/21/2023  Abigail Ortega [birth year] [id number]  Location: RN Hospital Liaison screened the patient remotely at [facility].  Insurance: [facility]   Abigail Ortega is a 73 y.o. female who is a Primary Care Patient of Margaretann Loveless, MD  [facility]. The patient was screened for 30 day readmission hospitalization with noted high risk score for unplanned readmission risk with 3 IP/2 ED in 6 months.  The patient was assessed for potential Care Management service needs for post hospital transition for care coordination. Review of patient's electronic medical record reveals patient was admitted with Diarrhea. Liaison spoke brief with pt and contacted the son Marcial Pacas. Educated on [employer]. Son indicates she has not been able to reach the [facility] social worker after calling several times today. Son inquired on LTC or possibly memory care due her recent falls. Liaison educated on possible resources through [facility] for available facilities and encouraged son to call a few if interested and inquire further with the admission's coordinator for long term care. States pt currently lives with family however this may end abruptly due to pt's ongoing medical issues. Offered VBCI referral for social worker to assist with available resources with this process (very receptive). Liaison will make a referral. Son indicates pt will discharged on Monday. He was very appreciative for the information provider.   Plan: [facility] Liaison will continue to follow progress and disposition to asess for post hospital community care coordination/management needs.  Referral request for community care coordination: Liaison will make a referral for [facility]BCI for post hospital prevention readmission follow up calls.   VBCI Care Management/Population Health does not replace or interfere with any arrangements made by the Inpatient Transition of Care  team.   For questions contact:   Elliot Cousin, RN, BSN Hospital Liaison Nadine   [facility], Population Health Office Hours MTWF  8:00 am-6:00 pm Direct Dial: [phone] mobile [email or web address]

## 2023-09-21 NOTE — Progress Notes (Signed)
 Progress Note   Patient: Abigail Ortega [id number]:[id number] DOB: Feb 13, 1951 DOA: 09/18/2023     2 DOS: the patient was seen and examined on 09/21/2023   Brief hospital course: NATALYN SZYMANOWSKI is a 73 y.o. female with medical history significant for CAD s/p DES, breast CA s/p chemorads and mastectomy, hypothyroidism, HTN, chronic hypotension on midodrine, and C. difficile colitis December 2024 treated with vancomycin, rehospitalized from 1/16 to 08/20/2023 with recurrent diarrhea ruled out for C. 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  Stool study positive for C. difficile antigen and toxin.  Dificid was started.   Principal Problem:   Diarrhea, recurrent Active Problems:   History of Clostridioides difficile colitis 06/2023   Hypokalemia   Hypomagnesemia   CAD (coronary artery disease)   Breast cancer of lower-inner quadrant of right female breast (HCC)   Depression   Other irritable bowel syndrome   Chronic obstructive lung disease (HCC)   Anemia of chronic disease   C. difficile colitis   Hypophosphatemia   Assessment and Plan: Recurrent C. difficile colitis. Patient had a transient hypotension due to dehydration from diarrhea.  Received IV fluids, blood pressure is better. Stool study came back positive for C. difficile pathogen as well as toxin.  This is the second time patient has C. difficile colitis diagnosed.  Patient be treated with Dificid. Patient had temperature 100.4, but clinically improving, diarrhea is slowing down.  KUB did not show any evidence of megacolon. Patient still has audible loose stools a day, but amount of smaller.  Patient has good appetite without nausea vomiting.  I will continue  Dificid, also added Creon. Per infect disease pharmacy recommendation, patient will be treated with Dificid twice a day for 5 days, followed with 200 mg every other day for 20 days.   Hypokalemia Hypomagnesemia Hyponatremia. Hypophosphatemia. Mild metabolic acidosis. Potassium has normalized, magnesium 1.7, will give another 2 g of magnesium sulfate, phosphate 2.1, give 15 mmol of IV potassium phosphate.  Avoid oral phosphate due to diarrhea.  Also sodium bicarb.   Anemia of chronic disease  Reactive thrombocytosis. Recent iron study and B12 study are within normal limits.  Hemoglobin is better today, continue to follow.  Chronic hypotension. Essential hypertension. Pressures better on midodrine.  No need for blood pressure medicines.   COPD. Stable,   Coronary artery disease  Stable.        Subjective:  Patient still has multiple small amount of loose stools, some abdominal cramping pain, no nausea vomiting.  Appetite has improved.  Physical Exam: Vitals:   09/20/23 1935 09/20/23 2234 09/21/23 0436 09/21/23 0848  BP: 113/60 109/66 114/65 119/73  Pulse: 92 88 94 94  Resp: 20  16 18   Temp: 98.4 F (36.9 C)  99.5 F (37.5 C) 98.7 F (37.1 C)  TempSrc: Oral  Oral Oral  SpO2: 99%  98% 99%  Weight:      Height:       General exam: Appears calm and comfortable  Respiratory system: Clear to auscultation. Respiratory effort normal. Cardiovascular system: S1 & S2 heard, RRR. No JVD, murmurs, rubs, gallops or clicks. No pedal edema. Gastrointestinal system: Abdomen is nondistended, soft and nontender. No organomegaly or masses felt. Normal bowel sounds heard. Central nervous system: Alert and oriented. No  focal neurological deficits. Extremities: Symmetric 5 x 5 power. Skin: No rashes, lesions or ulcers Psychiatry: Judgement and insight appear normal. Mood & affect appropriate.    Data Reviewed:  KUB report reviewed, lab results reviewed  Family Communication: son  updated.   Disposition: Status is: Inpatient Remains inpatient appropriate because: Severity of disease.  IV treatment.     Time spent: 35 minutes  Author: Marrion Coy, MD 09/21/2023 11:57 AM  For on call review [email or web address].

## 2023-09-21 NOTE — Plan of Care (Signed)

## 2023-09-21 NOTE — Plan of Care (Signed)

## 2023-09-22 DIAGNOSIS — D638 Anemia in other chronic diseases classified elsewhere: Secondary | ICD-10-CM | POA: Diagnosis not present

## 2023-09-22 DIAGNOSIS — A0472 Enterocolitis due to Clostridium difficile, not specified as recurrent: Secondary | ICD-10-CM | POA: Diagnosis not present

## 2023-09-22 DIAGNOSIS — E876 Hypokalemia: Secondary | ICD-10-CM | POA: Diagnosis not present

## 2023-09-22 LAB — BASIC METABOLIC PANEL
Anion gap: 5 (ref 5–15)
BUN: 7 mg/dL — ABNORMAL LOW (ref 8–23)
CO2: 22 mmol/L (ref 22–32)
Calcium: 7.7 mg/dL — ABNORMAL LOW (ref 8.9–10.3)
Chloride: 106 mmol/L (ref 98–111)
Creatinine, Ser: 0.79 mg/dL (ref 0.44–1.00)
GFR, Estimated: >60 mL/min (ref >60)
Glucose, Bld: 83 mg/dL (ref 70–99)
Potassium: 3.8 mmol/L (ref 3.5–5.1)
Sodium: 133 mmol/L — ABNORMAL LOW (ref 135–145)

## 2023-09-22 LAB — PHOSPHORUS: Phosphorus: 2.4 mg/dL — ABNORMAL LOW (ref 2.5–4.6)

## 2023-09-22 LAB — MAGNESIUM: Magnesium: 2.1 mg/dL (ref 1.7–2.4)

## 2023-09-22 MED ORDER — POTASSIUM PHOSPHATES 15 MMOLE/5ML IV SOLN
15.0000 mmol | Freq: Once | INTRAVENOUS | Status: AC
  Administered 2023-09-22: 15 mmol via INTRAVENOUS
  Filled 2023-09-22: qty 5

## 2023-09-22 MED ORDER — ORAL CARE MOUTH RINSE: 15.0000 mL | OROMUCOSAL | Status: DC | PRN

## 2023-09-22 MED ORDER — MIDODRINE HCL 5 MG PO TABS
5.0000 mg | ORAL_TABLET | Freq: Three times a day (TID) | ORAL | Status: DC
  Administered 2023-09-22 – 2023-09-29 (×21): 5 mg via ORAL
  Filled 2023-09-22 (×20): qty 1

## 2023-09-22 NOTE — Plan of Care (Signed)

## 2023-09-22 NOTE — Plan of Care (Signed)

## 2023-09-22 NOTE — Progress Notes (Signed)
 Progress Note   Patient: Abigail Ortega WUJ:[id number] DOB: 08/29/1950 DOA: 09/18/2023     3 DOS: the patient was seen and examined on 09/22/2023   Brief hospital course: MILEY BLANCHETT is a 73 y.o. female with medical history significant for CAD s/p DES, breast CA s/p chemorads and mastectomy, hypothyroidism, HTN, chronic hypotension on midodrine, and C. difficile colitis December 2024 treated with vancomycin, rehospitalized from 1/16 to 08/20/2023 with recurrent diarrhea ruled out for C. 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  Stool study positive for C. difficile antigen and toxin.  Dificid was started.   Principal Problem:   Diarrhea, recurrent Active Problems:   History of Clostridioides difficile colitis 06/2023   Hypokalemia   Hypomagnesemia   CAD (coronary artery disease)   Breast cancer of lower-inner quadrant of right female breast (HCC)   Depression   Other irritable bowel syndrome   Chronic obstructive lung disease (HCC)   Anemia of chronic disease   C. difficile colitis   Hypophosphatemia   Assessment and Plan: Recurrent C. difficile colitis. Patient had a transient hypotension due to dehydration from diarrhea.  Received IV fluids, blood pressure is better. Stool study came back positive for C. difficile pathogen as well as toxin.  This is the second time patient has C. difficile colitis diagnosed.  Patient be treated with Dificid. Patient had temperature 100.4 3/6, but clinically improving, diarrhea is slowing down.  KUB did not show any evidence of megacolon. 3/7. Patient still has multiple loose stools a day, but amount of smaller.  Patient has good appetite without nausea vomiting.  I will  continue Dificid, also added Creon. Per infective disease pharmacy recommendation, patient will be treated with Dificid twice a day for 5 days, followed with 200 mg every other day for 20 days. 3/8.  Diarrhea significantly improving, but still has significant cramping with the bowel movement.  Continue to monitor patient.   Hypokalemia Hypomagnesemia Hyponatremia. Hypophosphatemia. Mild metabolic acidosis. Continue replete magnesium, bicarb level has normalized, will give another day of sodium bicarb.   Anemia of chronic disease  Reactive thrombocytosis. Recent iron study and B12 study are within normal limits.  Recheck labs on Monday before discharge.   Chronic hypotension. Essential hypertension. Pressures better on midodrine.  Reduce dose of midodrine.   COPD. Stable,   Coronary artery disease  Stable.      Subjective:  Diarrhea is slowing down, still has cramping with bowel movements.  No nausea vomiting, tolerating diet well.  No fever or chills.  Physical Exam: Vitals:   09/21/23 1519 09/21/23 2008 09/22/23 0401 09/22/23 0815  BP: (!) 94/52 104/64 104/70 127/81  Pulse: 91 92 91 92  Resp: 18 16 16 16   Temp: 98.6 F (37 C) 98.6 F (37 C) 98.1 F (36.7 C) 98.4 F (36.9 C)  TempSrc:  Oral Oral   SpO2: 99% 98% 100% 100%  Weight:      Height:       General exam: Appears calm and comfortable  Respiratory system: Clear to auscultation. Respiratory effort normal. Cardiovascular system: S1 & S2 heard, RRR. No JVD, murmurs, rubs, gallops or clicks. No pedal edema. Gastrointestinal system: Abdomen is nondistended, soft and mildly tender. No organomegaly or masses felt. Normal bowel sounds heard. Central nervous  system: Alert and oriented. No focal neurological deficits. Extremities: Symmetric 5 x 5 power. Skin: No rashes, lesions or ulcers Psychiatry: Judgement and insight appear normal. Mood & affect appropriate.    Data Reviewed:  Lab results  reviewed.  Family Communication: called, could not reach son  Disposition: Status is: Inpatient Remains inpatient appropriate because: Severity of disease,     Time spent: 35 minutes  Author: Marrion Coy, MD 09/22/2023 10:10 AM  For on call review [email or web address].

## 2023-09-23 DIAGNOSIS — A0472 Enterocolitis due to Clostridium difficile, not specified as recurrent: Secondary | ICD-10-CM | POA: Diagnosis not present

## 2023-09-23 DIAGNOSIS — D638 Anemia in other chronic diseases classified elsewhere: Secondary | ICD-10-CM | POA: Diagnosis not present

## 2023-09-23 DIAGNOSIS — E876 Hypokalemia: Secondary | ICD-10-CM | POA: Diagnosis not present

## 2023-09-23 LAB — GLUCOSE, CAPILLARY: Glucose-Capillary: 126 mg/dL — ABNORMAL HIGH (ref 70–99)

## 2023-09-23 NOTE — Progress Notes (Signed)
 Progress Note   Patient: Abigail Ortega [id number]:[id number] DOB: 10-12-1950 DOA: 09/18/2023     4 DOS: the patient was seen and examined on 09/23/2023   Brief hospital course: Abigail Ortega is a 73 y.o. female with medical history significant for CAD s/p DES, breast CA s/p chemorads and mastectomy, hypothyroidism, HTN, chronic hypotension on midodrine, and C. difficile colitis December 2024 treated with vancomycin, rehospitalized from 1/16 to 08/20/2023 with recurrent diarrhea ruled out for C. 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  Stool study positive for C. difficile antigen and toxin.  Dificid was started.   Principal Problem:   Diarrhea, recurrent Active Problems:   History of Clostridioides difficile colitis 06/2023   Hypokalemia   Hypomagnesemia   CAD (coronary artery disease)   Breast cancer of lower-inner quadrant of right female breast (HCC)   Depression   Other irritable bowel syndrome   Chronic obstructive lung disease (HCC)   Anemia of chronic disease   C. difficile colitis   Hypophosphatemia   Assessment and Plan: Recurrent C. difficile colitis. Patient had a transient hypotension due to dehydration from diarrhea.  Received IV fluids, blood pressure is better. Stool study came back positive for C. difficile pathogen as well as toxin.  This is the second time patient has C. difficile colitis diagnosed.  Patient be treated with Dificid. Patient had temperature 100.4 3/6, but clinically improving, diarrhea is slowing down.  KUB did not show any evidence of megacolon. 3/7. Patient still has multiple loose stools a day, but amount of smaller.  Patient has good appetite without nausea vomiting.  I will  continue Dificid, also added Creon. Per infective disease pharmacy recommendation, patient will be treated with Dificid twice a day for 5 days, followed with 200 mg every other day for 20 days. 3/8.  Diarrhea significantly improving, but still has significant cramping with the bowel movement.  Continue to monitor patient. 3/9.  Still has small amount of loose stools, still watery.  Abdominal cramping is seem to be better today. Continue current treatment,   Hypokalemia Hypomagnesemia Hyponatremia. Hypophosphatemia. Mild metabolic acidosis. Condition improving, recheck levels tomorrow.   Anemia of chronic disease  Reactive thrombocytosis. Recent iron study and B12 study are within normal limits.  Recheck labs on Monday before discharge.   Chronic hypotension. Essential hypertension. Pressures better on midodrine.  Reduced dose of midodrine.     COPD. Stable,   Coronary artery disease  Stable.        Subjective:  Patient still complaining of diarrhea and cramping.  Physical Exam: Vitals:   09/22/23 1440 09/22/23 1955 09/23/23 0456 09/23/23 0821  BP: 97/60 96/66 115/70 (!) 102/52  Pulse: 86 78 81 92  Resp:  18 20 16   Temp: 98.8 F (37.1 C) 98.7 F (37.1 C) 98.6 F (37 C) 99.7 F (37.6 C)  TempSrc:   Oral   SpO2: 100% 100% 100% 100%  Weight:      Height:       General exam: Appears calm and comfortable  Respiratory system: Clear to auscultation. Respiratory effort normal. Cardiovascular system: S1 & S2 heard, RRR. No JVD, murmurs, rubs, gallops or clicks. No pedal edema. Gastrointestinal system: Abdomen is nondistended, soft and nontender. No organomegaly or masses felt. Normal bowel sounds heard.  Central nervous system: Alert and oriented. No focal neurological deficits. Extremities: Symmetric 5 x 5 power. Skin: No rashes, lesions or ulcers Psychiatry: Judgement and insight appear normal. Mood & affect appropriate.    Data Reviewed:  No new labs  today.  Family Communication: None  Disposition: Status is: Inpatient Remains inpatient appropriate because: Severity of disease,     Time spent: 35 minutes  Author: Marrion Coy, MD 09/23/2023 10:12 AM  For on call review [email or web address].

## 2023-09-23 NOTE — Plan of Care (Signed)

## 2023-09-24 DIAGNOSIS — A0472 Enterocolitis due to Clostridium difficile, not specified as recurrent: Secondary | ICD-10-CM | POA: Diagnosis not present

## 2023-09-24 DIAGNOSIS — D638 Anemia in other chronic diseases classified elsewhere: Secondary | ICD-10-CM | POA: Diagnosis not present

## 2023-09-24 DIAGNOSIS — E876 Hypokalemia: Secondary | ICD-10-CM | POA: Diagnosis not present

## 2023-09-24 LAB — CBC
HCT: 25 % — ABNORMAL LOW (ref 36.0–46.0)
Hemoglobin: 8.4 g/dL — ABNORMAL LOW (ref 12.0–15.0)
MCH: 31.1 pg (ref 26.0–34.0)
MCHC: 33.6 g/dL (ref 30.0–36.0)
MCV: 92.6 fL (ref 80.0–100.0)
Platelets: 472 10*3/uL — ABNORMAL HIGH (ref 150–400)
RBC: 2.7 MIL/uL — ABNORMAL LOW (ref 3.87–5.11)
RDW: 14.8 % (ref 11.5–15.5)
WBC: 7.1 10*3/uL (ref 4.0–10.5)
nRBC: 0.3 % — ABNORMAL HIGH (ref 0.0–0.2)

## 2023-09-24 LAB — BASIC METABOLIC PANEL
Anion gap: 7 (ref 5–15)
BUN: 8 mg/dL (ref 8–23)
CO2: 24 mmol/L (ref 22–32)
Calcium: 8.4 mg/dL — ABNORMAL LOW (ref 8.9–10.3)
Chloride: 105 mmol/L (ref 98–111)
Creatinine, Ser: 0.87 mg/dL (ref 0.44–1.00)
GFR, Estimated: >60 mL/min (ref >60)
Glucose, Bld: 83 mg/dL (ref 70–99)
Potassium: 3.4 mmol/L — ABNORMAL LOW (ref 3.5–5.1)
Sodium: 136 mmol/L (ref 135–145)

## 2023-09-24 LAB — PHOSPHORUS: Phosphorus: 3.5 mg/dL (ref 2.5–4.6)

## 2023-09-24 LAB — MAGNESIUM: Magnesium: 1.7 mg/dL (ref 1.7–2.4)

## 2023-09-24 MED ORDER — SODIUM CHLORIDE 0.9 % IV BOLUS
500.0000 mL | Freq: Once | INTRAVENOUS | Status: AC
  Administered 2023-09-24: 500 mL via INTRAVENOUS

## 2023-09-24 MED ORDER — POTASSIUM CHLORIDE CRYS ER 20 MEQ PO TBCR
40.0000 meq | EXTENDED_RELEASE_TABLET | ORAL | Status: AC
  Administered 2023-09-24 (×2): 40 meq via ORAL
  Filled 2023-09-24: qty 2

## 2023-09-24 MED ORDER — MAGNESIUM SULFATE 2 GM/50ML IV SOLN
2.0000 g | Freq: Once | INTRAVENOUS | Status: AC
  Administered 2023-09-24: 2 g via INTRAVENOUS
  Filled 2023-09-24: qty 50

## 2023-09-24 MED ORDER — METHOCARBAMOL 500 MG PO TABS
500.0000 mg | ORAL_TABLET | Freq: Four times a day (QID) | ORAL | Status: DC | PRN
  Administered 2023-09-24 – 2023-10-02 (×6): 500 mg via ORAL
  Filled 2023-09-24 (×6): qty 1

## 2023-09-24 MED ORDER — CLOPIDOGREL BISULFATE 75 MG PO TABS
75.0000 mg | ORAL_TABLET | Freq: Every day | ORAL | Status: DC
  Administered 2023-09-24 – 2023-10-02 (×9): 75 mg via ORAL
  Filled 2023-09-24 (×9): qty 1

## 2023-09-24 NOTE — Progress Notes (Signed)
 Occupational Therapy Treatment Patient Details Name: Abigail Ortega MRN: [id number] DOB: Jul 17, 1951 Today's Date: 09/24/2023   History of present illness Pt is a 73 yo female that presented to ED for watery, bloody diarrhea, workup positive for cdiff. Noted to have had recent hospitalizations (dec 2024 and 08/02/2023-08/20/2023) for similar concerns, PMH of  CAD s/p DES, breast CA s/p chemorads and mastectomy, hypothyroidism, HTN, chronic hypotension on midodrine, COPD.   OT comments  Pt is supine in bed on arrival. Easily arousable and agreeable to OT session. She continues to experience stomach cramping pain throughout session. Pt performed bed mobility with SUP, STS from EOB to sink with SUP and UB bathing/LB bathing partially standing at sink and partially long sitting in bed with SUP/SBA. Reports her legs became tired after standing for ~5-7 mins at sink and declined in room mobility d/t her stomach cramps and gas.  Pt returned to bed with all needs in place and will cont to require skilled acute OT services to maximize her safety and IND to return to PLOF.       If plan is discharge home, recommend the following:  A little help with walking and/or transfers;A lot of help with bathing/dressing/bathroom;Assist for transportation;Assistance with cooking/housework;Help with stairs or ramp for entrance   Equipment Recommendations  None recommended by OT    Recommendations for Other Services      Precautions / Restrictions Precautions Precautions: Fall Restrictions Weight Bearing Restrictions Per Provider Order: No       Mobility Bed Mobility Overal bed mobility: Needs Assistance Bed Mobility: Supine to Sit, Sit to Supine     Supine to sit: Supervision Sit to supine: Supervision   General bed mobility comments: SUP for all bed mobility    Transfers Overall transfer level: Needs assistance Equipment used: 1 person hand held assist Transfers: Sit to/from Stand Sit to  Stand: Supervision           General transfer comment: SUP for STS from EOB and SUP to stand at sink and perform dynamic ADL tasks for brief period until legs became tired     Balance Overall balance assessment: Needs assistance Sitting-balance support: Feet supported Sitting balance-Leahy Scale: Good     Standing balance support: Single extremity supported Standing balance-Leahy Scale: Good Standing balance comment: UE on sink during face washing; then no UE support for LB bathing of peri region with SBA                           ADL either performed or assessed with clinical judgement   ADL Overall ADL's : Needs assistance/impaired     Grooming: Wash/dry face;Standing;Supervision/safety;Applying deodorant Grooming Details (indicate cue type and reason): at sink Upper Body Bathing: Supervision/ safety;Sitting   Lower Body Bathing: Contact guard assist;Supervison/ safety;Sit to/from stand;Bed level Lower Body Bathing Details (indicate cue type and reason): anterior hygiene performed standing at sink then performed posterior hygiene at bed level d/t stating her legs were getting tired from standing Upper Body Dressing : Minimal assistance;Sitting                          Extremity/Trunk Assessment              Vision       Perception     Praxis     Communication     Cognition  Cueing      Exercises      Shoulder Instructions       General Comments      Pertinent Vitals/ Pain          Home Living                                          Prior Functioning/Environment              Frequency  Min 1X/week        Progress Toward Goals  OT Goals(current goals can now be found in the care plan section)  Progress towards OT goals: Progressing toward goals  Acute Rehab OT Goals Patient Stated Goal: return home OT Goal Formulation: With  patient Time For Goal Achievement: 10/04/23 Potential to Achieve Goals: Good  Plan      Co-evaluation                 AM-PAC OT "6 Clicks" Daily Activity     Outcome Measure   Help from another person eating meals?: None Help from another person taking care of personal grooming?: None Help from another person toileting, which includes using toliet, bedpan, or urinal?: A Little Help from another person bathing (including washing, rinsing, drying)?: A Little Help from another person to put on and taking off regular upper body clothing?: A Little Help from another person to put on and taking off regular lower body clothing?: A Little 6 Click Score: 20    End of Session    OT Visit Diagnosis: Other abnormalities of gait and mobility (R26.89);Muscle weakness (generalized) (M62.81)   Activity Tolerance Patient tolerated treatment well   Patient Left in bed;with bed alarm set;with call bell/phone within reach   Nurse Communication Mobility status        Time: 1423-1450 OT Time Calculation (min): 27 min  Charges: OT General Charges $OT Visit: 1 Visit OT Treatments $Self Care/Home Management : 23-37 mins  Azura Tufaro, OTR/L  09/24/23, 3:46 PM   Maitland Lesiak E Celinda Dethlefs 09/24/2023, 3:45 PM

## 2023-09-24 NOTE — Care Management Important Message (Signed)
 Important Message  Patient Details  Name: Abigail Ortega MRN: [id number] Date of Birth: 01-15-1951   Important Message Given:  Yes - Medicare IM     Cristela Blue, CMA 09/24/2023, 12:34 PM

## 2023-09-24 NOTE — Plan of Care (Signed)

## 2023-09-24 NOTE — Progress Notes (Addendum)
 Progress Note   Patient: Abigail Ortega:[id number] DOB: 08/07/1950 DOA: 09/18/2023     5 DOS: the patient was seen and examined on 09/24/2023   Brief hospital course: Abigail Ortega is a 73 y.o. female with medical history significant for CAD s/p DES, breast CA s/p chemorads and mastectomy, hypothyroidism, HTN, chronic hypotension on midodrine, and C. difficile colitis December 2024 treated with vancomycin, rehospitalized from 1/16 to 08/20/2023 with recurrent diarrhea ruled out for C. 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  Stool study positive for C. difficile antigen and toxin.  Dificid was started.   Principal Problem:   Diarrhea, recurrent Active Problems:   History of Clostridioides difficile colitis 06/2023   Hypokalemia   Hypomagnesemia   CAD (coronary artery disease)   Breast cancer of lower-inner quadrant of right female breast (HCC)   Depression   Other irritable bowel syndrome   Chronic obstructive lung disease (HCC)   Anemia of chronic disease   C. difficile colitis   Hypophosphatemia   Assessment and Plan: Recurrent C. difficile colitis. Patient had a transient hypotension due to dehydration from diarrhea.  Received IV fluids, blood pressure is better. Stool study came back positive for C. difficile pathogen as well as toxin.  This is the second time patient has C. difficile colitis diagnosed.  Patient be treated with Dificid. Patient had temperature 100.4 3/6, but clinically improving, diarrhea is slowing down.  KUB did not show any evidence of megacolon. 3/7. Patient still has multiple loose stools a day, but amount of smaller.  Patient has good appetite without nausea vomiting.  I will  continue Dificid, also added Creon. Per infective disease pharmacy recommendation, patient will be treated with Dificid twice a day for 5 days, followed with 200 mg every other day for 20 days. For the last few days, patient continued to have small amount of loose stool with significant cramping.  Not ready for discharge.   Hypokalemia Hypomagnesemia Hyponatremia. Hypophosphatemia. Mild metabolic acidosis. Potassium again, give another dose of magnesium for level of 1.7.   Anemia of chronic disease  Reactive thrombocytosis. Recent iron study and B12 study are within normal limits.  Hemoglobin has been stable.  Chronic hypotension. Essential hypertension. Pressures better on midodrine.  Reduced dose of midodrine.     COPD. Stable,   Coronary artery disease  Stable.      Subjective:  Still complaining of significant cramping pain, loose stools, small amount.  Physical Exam: Vitals:   09/23/23 0821 09/23/23 2043 09/24/23 0323 09/24/23 0838  BP: (!) 102/52 112/71 (!) 86/53 105/67  Pulse: 92 84 89 99  Resp: 16 18 16 14   Temp: 99.7 F (37.6 C) 98 F (36.7 C) 98.4 F (36.9 C) 98.6 F (37 C)  TempSrc:  Axillary Oral Oral  SpO2: 100% 100% 100% 98%  Weight:      Height:       General exam: Appears calm and comfortable  Respiratory system: Clear to auscultation. Respiratory effort normal. Cardiovascular system: S1 & S2 heard, RRR. No JVD, murmurs, rubs, gallops or clicks. No pedal edema. Gastrointestinal system: Abdomen is nondistended, soft and nontender. No organomegaly or masses felt. Normal bowel sounds heard. Central nervous system: Alert and oriented. No focal neurological deficits. Extremities: Symmetric 5 x 5  power. Skin: No rashes, lesions or ulcers Psychiatry: Judgement and insight appear normal. Mood & affect appropriate.    Data Reviewed:  Lab results reviewed.  Family Communication: son updated over the phone  Disposition: Status is:  Inpatient Remains inpatient appropriate because: Severity of disease,     Time spent: 35 minutes  Author: Marrion Coy, MD 09/24/2023 1:01 PM  For on call review [email or web address].

## 2023-09-25 DIAGNOSIS — A0472 Enterocolitis due to Clostridium difficile, not specified as recurrent: Secondary | ICD-10-CM | POA: Diagnosis not present

## 2023-09-25 DIAGNOSIS — E876 Hypokalemia: Secondary | ICD-10-CM | POA: Diagnosis not present

## 2023-09-25 DIAGNOSIS — D638 Anemia in other chronic diseases classified elsewhere: Secondary | ICD-10-CM | POA: Diagnosis not present

## 2023-09-25 MED ORDER — POLYVINYL ALCOHOL 1.4 % OP SOLN
1.0000 [drp] | OPHTHALMIC | Status: DC | PRN
  Administered 2023-09-28 – 2023-09-30 (×2): 1 [drp] via OPHTHALMIC
  Filled 2023-09-25: qty 15

## 2023-09-25 MED ORDER — ISOSORBIDE MONONITRATE ER 30 MG PO TB24
60.0000 mg | ORAL_TABLET | Freq: Two times a day (BID) | ORAL | Status: DC
  Administered 2023-09-25 – 2023-09-26 (×4): 60 mg via ORAL
  Filled 2023-09-25 (×4): qty 2

## 2023-09-25 NOTE — Plan of Care (Signed)

## 2023-09-25 NOTE — Progress Notes (Signed)
 Progress Note   Patient: Abigail Ortega WGN:[id number] DOB: July 29, 1950 DOA: 09/18/2023     6 DOS: the patient was seen and examined on 09/25/2023   Brief hospital course: Abigail Ortega is a 73 y.o. female with medical history significant for CAD s/p DES, breast CA s/p chemorads and mastectomy, hypothyroidism, HTN, chronic hypotension on midodrine, and C. difficile colitis December 2024 treated with vancomycin, rehospitalized from 1/16 to 08/20/2023 with recurrent diarrhea ruled out for C. 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  Stool study positive for C. difficile antigen and toxin.  Dificid was started.   Principal Problem:   Diarrhea, recurrent Active Problems:   History of Clostridioides difficile colitis 06/2023   Hypokalemia   Hypomagnesemia   CAD (coronary artery disease)   Breast cancer of lower-inner quadrant of right female breast (HCC)   Depression   Other irritable bowel syndrome   Chronic obstructive lung disease (HCC)   Anemia of chronic disease   C. difficile colitis   Hypophosphatemia   Assessment and Plan: Recurrent C. difficile colitis. Patient had a transient hypotension due to dehydration from diarrhea.  Received IV fluids, blood pressure is better. Stool study came back positive for C. difficile pathogen as well as toxin.  This is the second time patient has C. difficile colitis diagnosed.  Patient be treated with Dificid. Patient had temperature 100.4 3/6, but clinically improving, diarrhea is slowing down.  KUB did not show any evidence of megacolon. 3/7. Patient still has multiple loose stools a day, but amount of smaller.  Patient has good appetite without nausea vomiting.  I will  continue Dificid, also added Creon. Per infective disease pharmacy recommendation, patient will be treated with Dificid twice a day for 5 days if condition improves,, followed with 200 mg every other day for 20 days. For the last few days, patient continued to have small amount of loose stool with significant cramping.  Not ready for discharge. Patient can be discharged in the next 1 to 2 days with abdominal pain and the diarrhea is better.   Hypokalemia Hypomagnesemia Hyponatremia. Hypophosphatemia. Mild metabolic acidosis. Recheck levels tomorrow.   Anemia of chronic disease  Reactive thrombocytosis. Recent iron study and B12 study are within normal limits.  Hemoglobin has been stable.   Chronic hypotension. Essential hypertension. Pressures better on midodrine.  Reduced dose of midodrine.     COPD. Stable,   Coronary artery disease  Stable.        Subjective:  Patient still complaining significant abdominal cramping pain, small amount of loose stools multiple times a day.  No fever or chills.  Physical Exam: Vitals:   09/24/23 0838 09/24/23 1542 09/24/23 1933 09/25/23 0352  BP: 105/67 (!) 93/57 (!) 89/42 (!) 93/58  Pulse: 99 93 89 94  Resp: 14 14 20 20   Temp: 98.6 F (37 C) 98.6 F (37 C) 99.4 F (37.4 C) 98.8 F (37.1 C)  TempSrc: Oral Oral Oral Oral  SpO2: 98% 100% 100% 99%  Weight:      Height:       General exam: Appears calm and comfortable  Respiratory system: Clear to auscultation. Respiratory effort normal. Cardiovascular system: S1 & S2 heard, RRR. No JVD, murmurs, rubs, gallops or clicks. No pedal edema. Gastrointestinal system: Abdomen  is nondistended, soft and nontender. No organomegaly or masses felt. Normal bowel sounds heard. Central nervous system: Alert and oriented. No focal neurological deficits. Extremities: Symmetric 5 x 5 power. Skin: No rashes, lesions or ulcers Psychiatry: Judgement and insight appear normal. Mood & affect  appropriate.    Data Reviewed:  Lab results reviewed.  Family Communication: son updated over the phone.   Disposition: Status is: Inpatient Remains inpatient appropriate because: Severity of disease,     Time spent: 35 minutes  Author: Marrion Coy, MD 09/25/2023 11:00 AM  For on call review [email or web address].

## 2023-09-25 NOTE — Plan of Care (Signed)

## 2023-09-25 NOTE — Progress Notes (Signed)
 Physical Therapy Treatment Patient Details Name: Abigail Ortega MRN: [id number] DOB: May 30, 1951 Today's Date: 09/25/2023   History of Present Illness Pt is a 73 yo female that presented to ED for watery, bloody diarrhea, workup positive for cdiff. Noted to have had recent hospitalizations (dec 2024 and 08/02/2023-08/20/2023) for similar concerns, PMH of  CAD s/p DES, breast CA s/p chemorads and mastectomy, hypothyroidism, HTN, chronic hypotension on midodrine, COPD.    PT Comments   Pt willing to work with PT but continues to be limited reporting severe, intermittent cramping pain in abdominals and demonstrated significant drop in BP requiring pt to lie back in bed and RN to be notified.  Overall pt is supervision to CGA with all mobility.  Pt willing to attempt gait but after a couple of steps reported feeling dizzy and sick. Pt sat on EOB and started to vomit took BP in sitting 63/36. Called and notified RN. Assist pt up to [facility] with MinA . RN came in a took BP again 91/61.  Continued PT will assist pt towards greater dynamic standing balance, LE strengthening, and activity tolerance to increase safety and independence and decrease burden of care with functional mobility.    If plan is discharge home, recommend the following: A little help with walking and/or transfers;A little help with bathing/dressing/bathroom;Assistance with cooking/housework;Supervision due to cognitive status;Assist for transportation;Help with stairs or ramp for entrance;Direct supervision/assist for medications management   Can travel by private vehicle        Equipment Recommendations  None recommended by PT    Recommendations for Other Services       Precautions / Restrictions Precautions Precautions: Fall Restrictions Weight Bearing Restrictions Per Provider Order: No     Mobility  Bed Mobility Overal bed mobility: Needs Assistance Bed Mobility: Supine to Sit, Sit to Supine     Supine to sit:  Supervision Sit to supine: Supervision        Transfers Overall transfer level: Needs assistance Equipment used: Rolling walker (2 wheels) Transfers: Sit to/from Stand, Bed to chair/wheelchair/BSC Sit to Stand: Supervision   Step pivot transfers: Contact guard assist            Ambulation/Gait Ambulation/Gait assistance: Contact guard assist Gait Distance (Feet): 5 Feet Assistive device: Rolling walker (2 wheels) Gait Pattern/deviations: Shuffle, Narrow base of support, Trunk flexed       General Gait Details: Pt willing to attempt gait but after a couple of steps reported feeling dizzy and sick. Pt sat on EOB pt started to vomit took BP in sitting 63/36. Called and notified RN.  Assist pt up to [facility] with MinA .  RN came in a took BP again 91/61.   Stairs             Wheelchair Mobility     Tilt Bed    Modified Rankin (Stroke Patients Only)       Balance Overall balance assessment: Needs assistance Sitting-balance support: Feet supported Sitting balance-Leahy Scale: Good Sitting balance - Comments: able to perform some pericare in sitting.   After standing, pt's BP dropped and pt required min A to maintain balance.   Standing balance support: Bilateral upper extremity supported, During functional activity, Reliant on assistive device for balance Standing balance-Leahy Scale: Fair Standing balance comment: after sitting up on commode for a while, pt went to stand and reported feeling unsteady.  Communication Communication Communication: No apparent difficulties  Cognition Arousal: Alert Behavior During Therapy: Lability   PT - Cognitive impairments: No apparent impairments                       PT - Cognition Comments: pt oriented, but labile; flucuating between joking with therapy to being disrespectful. follows all commands sometimes with extra time due to pt motivation Following commands: Impaired  (requires commands to be repeated.) Following commands impaired: Follows one step commands with increased time    Cueing Cueing Techniques: Verbal cues, Tactile cues  Exercises Total Joint Exercises Ankle Circles/Pumps: AROM, Strengthening, Both, 5 reps Quad Sets: AROM, Strengthening, Both, 5 reps Bridges: AROM, Strengthening, Both, 5 reps    General Comments        Pertinent Vitals/Pain Pain Assessment Faces Pain Scale: Hurts whole lot Pain Location: intermittent abdominal pain Pain Descriptors / Indicators: Grimacing, Guarding, Moaning Pain Intervention(s): Monitored during session, Repositioned, Limited activity within patient's tolerance    Home Living                          Prior Function            PT Goals (current goals can now be found in the care plan section) Acute Rehab PT Goals Patient Stated Goal: to feel better PT Goal Formulation: With patient Time For Goal Achievement: 10/04/23 Potential to Achieve Goals: Good Progress towards PT goals: Progressing toward goals    Frequency    Min 1X/week      PT Plan      Co-evaluation              AM-PAC PT "6 Clicks" Mobility   Outcome Measure  Help needed turning from your back to your side while in a flat bed without using bedrails?: A Little Help needed moving from lying on your back to sitting on the side of a flat bed without using bedrails?: A Little Help needed moving to and from a bed to a chair (including a wheelchair)?: A Little Help needed standing up from a chair using your arms (e.g., wheelchair or bedside chair)?: A Little Help needed to walk in hospital room?: A Little Help needed climbing 3-5 steps with a railing? : A Little 6 Click Score: 18    End of Session Equipment Utilized During Treatment: Gait belt Activity Tolerance: Patient limited by pain;Other (comment) (BP dropped in standing, needing to return to lying down quickly.) Patient left: in bed;with call  bell/phone within reach;with nursing/sitter in room Nurse Communication: Mobility status PT Visit Diagnosis: Other abnormalities of gait and mobility (R26.89);Difficulty in walking, not elsewhere classified (R26.2);Muscle weakness (generalized) (M62.81)     Time: 1050-1130 PT Time Calculation (min) (ACUTE ONLY): 40 min  Charges:    $Therapeutic Activity: 38-52 mins PT General Charges $$ ACUTE PT VISIT: 1 Visit                    Hortencia Conradi, PTA  09/25/23, 11:53 AM

## 2023-09-26 DIAGNOSIS — A0472 Enterocolitis due to Clostridium difficile, not specified as recurrent: Secondary | ICD-10-CM | POA: Diagnosis not present

## 2023-09-26 DIAGNOSIS — A09 Infectious gastroenteritis and colitis, unspecified: Secondary | ICD-10-CM

## 2023-09-26 LAB — MAGNESIUM: Magnesium: 1.8 mg/dL (ref 1.7–2.4)

## 2023-09-26 LAB — BASIC METABOLIC PANEL
Anion gap: 8 (ref 5–15)
BUN: 9 mg/dL (ref 8–23)
CO2: 25 mmol/L (ref 22–32)
Calcium: 8.6 mg/dL — ABNORMAL LOW (ref 8.9–10.3)
Chloride: 104 mmol/L (ref 98–111)
Creatinine, Ser: 0.9 mg/dL (ref 0.44–1.00)
GFR, Estimated: >60 mL/min (ref >60)
Glucose, Bld: 91 mg/dL (ref 70–99)
Potassium: 3 mmol/L — ABNORMAL LOW (ref 3.5–5.1)
Sodium: 137 mmol/L (ref 135–145)

## 2023-09-26 LAB — PHOSPHORUS: Phosphorus: 3.2 mg/dL (ref 2.5–4.6)

## 2023-09-26 MED ORDER — POTASSIUM CHLORIDE CRYS ER 20 MEQ PO TBCR
40.0000 meq | EXTENDED_RELEASE_TABLET | ORAL | Status: AC
  Administered 2023-09-26 (×2): 40 meq via ORAL
  Filled 2023-09-26 (×2): qty 2

## 2023-09-26 MED ORDER — SODIUM CHLORIDE 0.9 % IV SOLN: INTRAVENOUS | Status: AC

## 2023-09-26 MED ORDER — ENOXAPARIN SODIUM 40 MG/0.4ML IJ SOSY
40.0000 mg | PREFILLED_SYRINGE | INTRAMUSCULAR | Status: DC
  Administered 2023-09-26 – 2023-10-04 (×8): 40 mg via SUBCUTANEOUS
  Filled 2023-09-26 (×9): qty 0.4

## 2023-09-26 NOTE — Progress Notes (Signed)
  PROGRESS NOTE    Abigail Ortega  NWG:[id number] DOB: May 20, 1951 DOA: 09/18/2023 PCP: Margaretann Loveless, MD  [id number]  LOS: 7 days   Brief hospital course:   Assessment & Plan: Abigail Ortega is a 73 y.o. female with medical history significant for CAD s/p DES, breast CA s/p chemorads and mastectomy, hypothyroidism, HTN, chronic hypotension on midodrine, and C. difficile colitis December 2024 treated with vancomycin, rehospitalized from 1/16 to 08/20/2023 with recurrent diarrhea ruled out for C. 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  Stool study positive for C. difficile antigen and toxin.  Dificid was started.   Recurrent C. difficile colitis. Patient had a transient hypotension due to dehydration from diarrhea.  Received IV fluids, blood pressure is better. Stool study came back positive for C. difficile pathogen as well as toxin.  This is the second time patient has C. difficile colitis diagnosed.  Patient be treated with Dificid. Patient had temperature 100.4 3/6, but clinically improving, diarrhea is slowing down.  KUB did not show any evidence of megacolon. 3/7. Patient still has multiple loose stools a day, but amount of smaller.  Patient has good appetite without nausea vomiting.  I will continue Dificid, also added Creon. Per infective disease pharmacy recommendation, patient will be treated with Dificid twice a day for 5 days if condition improves,, followed with 200 mg every other day for 20 days. --cont Dificid 200 mg BID  Mild orthostasis --start MIVF  Hypokalemia Hypomagnesemia Hypophosphatemia. --monitor and supplement PRN  Hyponatremia.   Anemia of chronic disease  Recent iron study and  B12 study are within normal limits.  Hemoglobin has been stable.  Reactive thrombocytosis.   Chronic hypotension. --cont midodrine --d/c Imdur   COPD. Stable,   Coronary artery disease  Stable.  Weakness --PT/OT     DVT prophylaxis: Lovenox SQ Code Status: Full code  Family Communication: granddaughter updated at bedside today Level of care: Med-Surg Dispo:   The patient is from: home Anticipated d/c is to: home Anticipated d/c date is: 1-2 days   Subjective and Interval History:  Pt continued to complain of abdominal cramping.  No more diarrhea, per nursing report.  Pt reported feeling too weak to stand.   Objective: Vitals:   09/26/23 0917 09/26/23 1535 09/26/23 1544 09/26/23 1546  BP: 127/70 119/67 101/75 91/68  Pulse: 86 86 92 (!) 115  Resp: 14 16    Temp: 98.4 F (36.9 C) 98.3 F (36.8 C)    TempSrc: Oral     SpO2: 100% 99% 100% 97%  Weight:      Height:        Intake/Output Summary (Last 24 hours) at 09/26/2023 2026 Last data filed at 09/26/2023 1905 Gross per 24 hour  Intake 1580 ml  Output --  Net 1580 ml   Filed Weights   09/18/23 1522  Weight: 52.2 kg    Examination:   Constitutional: NAD, AAOx3 HEENT: conjunctivae and lids normal, EOMI CV: No cyanosis.   RESP: normal respiratory effort, on RA Neuro: II - XII grossly intact.     Data Reviewed: I have personally reviewed labs and imaging studies  Time spent: 50 minutes  Darlin Priestly, MD [facility] If 7PM-7AM, please contact night-coverage 09/26/2023, 8:26 PM

## 2023-09-26 NOTE — TOC Progression Note (Signed)
 Transition of Care [facility]) - Progression Note    Patient Details  Name: Abigail Ortega MRN: [id number] Date of Birth: 1951-05-01  [facility] of [facility] CM/SW Contact  Chapman Fitch, RN Phone Number: 09/26/2023, 12:16 PM  Clinical Narrative:    Plan remains to return home with home health through [facility] when medically stable for discharge         Expected Discharge Plan and Services                                               Social Determinants of Health (SDOH) Interventions SDOH Screenings   Food Insecurity: No Food Insecurity (09/18/2023)  Housing: Unknown (09/18/2023)  Transportation Needs: No Transportation Needs (09/18/2023)  Utilities: Not At Risk (09/18/2023)  Social Connections: Moderately Integrated (09/18/2023)  Tobacco Use: Medium Risk (09/18/2023)    Readmission Risk Interventions     No data to display

## 2023-09-26 NOTE — Plan of Care (Signed)

## 2023-09-27 ENCOUNTER — Inpatient Hospital Stay

## 2023-09-27 DIAGNOSIS — R109 Unspecified abdominal pain: Secondary | ICD-10-CM | POA: Diagnosis not present

## 2023-09-27 DIAGNOSIS — A09 Infectious gastroenteritis and colitis, unspecified: Secondary | ICD-10-CM | POA: Diagnosis not present

## 2023-09-27 DIAGNOSIS — A0472 Enterocolitis due to Clostridium difficile, not specified as recurrent: Secondary | ICD-10-CM | POA: Diagnosis not present

## 2023-09-27 LAB — POTASSIUM: Potassium: 3.7 mmol/L (ref 3.5–5.1)

## 2023-09-27 MED ORDER — ONDANSETRON 4 MG PO TBDP
4.0000 mg | ORAL_TABLET | Freq: Three times a day (TID) | ORAL | Status: DC | PRN
  Administered 2023-10-03: 4 mg via ORAL
  Filled 2023-09-27: qty 1

## 2023-09-27 MED ORDER — ONDANSETRON HCL 4 MG/2ML IJ SOLN
4.0000 mg | Freq: Four times a day (QID) | INTRAMUSCULAR | Status: DC | PRN
  Administered 2023-09-27 – 2023-09-28 (×3): 4 mg via INTRAVENOUS
  Filled 2023-09-27 (×3): qty 2

## 2023-09-27 MED ORDER — COLESTIPOL HCL 1 G PO TABS
2.0000 g | ORAL_TABLET | Freq: Two times a day (BID) | ORAL | Status: DC
  Administered 2023-09-27 – 2023-09-28 (×3): 2 g via ORAL
  Filled 2023-09-27 (×4): qty 2

## 2023-09-27 NOTE — Progress Notes (Signed)
 Occupational Therapy Treatment Patient Details Name: Abigail Ortega MRN: [id number] DOB: 12-05-1950 Today's Date: 09/27/2023   History of present illness Pt is a 73 yo female that presented to ED for watery, bloody diarrhea, workup positive for cdiff. Noted to have had recent hospitalizations (dec 2024 and 08/02/2023-08/20/2023) for similar concerns, PMH of  CAD s/p DES, breast CA s/p chemorads and mastectomy, hypothyroidism, HTN, chronic hypotension on midodrine, COPD.   OT comments  Upon entering the room, pt supine in bed and agreeable to OT intervention. NT reported that pt has been calling out frequently for urination. Pt has been using bed pan. OT discussed using BSC or ambulation to bathroom to increase strength and mobility. Pt then declined all OOB activities and reports she "wants to take a nap". OT encouraged pt but she declined further. She does reposition self in bed with encouragement and OT positioning pillows for comfort. Bed alarm activated. Call bell and all needed items within reach.       If plan is discharge home, recommend the following:  A little help with walking and/or transfers;A lot of help with bathing/dressing/bathroom;Assist for transportation;Assistance with cooking/housework;Help with stairs or ramp for entrance   Equipment Recommendations  None recommended by OT       Precautions / Restrictions Precautions Precautions: Fall       Mobility Bed Mobility Overal bed mobility: Modified Independent                  Transfers                   General transfer comment: Pt refuses     Balance                                           ADL either performed or assessed with clinical judgement   ADL Overall ADL's : Needs assistance/impaired     Grooming: Wash/dry face;Set up                                      Extremity/Trunk Assessment Upper Extremity Assessment Upper Extremity Assessment:  Generalized weakness   Lower Extremity Assessment Lower Extremity Assessment: Generalized weakness        Vision Patient Visual Report: No change from baseline           Communication Communication Communication: No apparent difficulties   Cognition Arousal: Alert Behavior During Therapy: WFL for tasks assessed/performed                                 Following commands: Impaired Following commands impaired: Follows one step commands with increased time      Cueing   Cueing Techniques: Verbal cues, Tactile cues  Exercises              Pertinent Vitals/ Pain       Pain Assessment Pain Assessment: Faces Faces Pain Scale: Hurts a little bit Pain Location: intermittent abdominal pain Pain Descriptors / Indicators: Discomfort Pain Intervention(s): Limited activity within patient's tolerance, Repositioned, Monitored during session         Frequency  Min 1X/week        Progress Toward Goals  OT Goals(current goals can now be found in the care  plan section)  Progress towards OT goals: Progressing toward goals      AM-PAC OT "6 Clicks" Daily Activity     Outcome Measure   Help from another person eating meals?: None Help from another person taking care of personal grooming?: None Help from another person toileting, which includes using toliet, bedpan, or urinal?: A Little Help from another person bathing (including washing, rinsing, drying)?: A Little Help from another person to put on and taking off regular upper body clothing?: A Little Help from another person to put on and taking off regular lower body clothing?: A Little 6 Click Score: 20    End of Session    OT Visit Diagnosis: Other abnormalities of gait and mobility (R26.89);Muscle weakness (generalized) (M62.81)   Activity Tolerance Patient limited by fatigue   Patient Left in bed;with bed alarm set;with call bell/phone within reach   Nurse Communication Mobility status         Time: 1050-1100 OT Time Calculation (min): 10 min  Charges: OT General Charges $OT Visit: 1 Visit OT Treatments $Self Care/Home Management : 8-22 mins  Jackquline Denmark, MS, OTR/L , CBIS ascom [phone]  09/27/23, 1:13 PM

## 2023-09-27 NOTE — Telephone Encounter (Signed)
 I tried to call to schedule an appointment. The patient didn't answer and was unable to leave a voicemail.

## 2023-09-27 NOTE — Progress Notes (Signed)
 Mobility Specialist - Progress Note   09/27/23 1543  Mobility  Activity Ambulated with assistance in room;Ambulated with assistance to bathroom  Level of Assistance Other (Comment) (B HHA)  Assistive Device Other (Comment) (B HHA)  Distance Ambulated (ft) 20 ft  Activity Response Tolerated well  Mobility visit 1 Mobility     Pt lying in bed upon arrival, utilizing RA. Pt agreeable to activity. Completed bed mobility modI. STS and ambulation in room to bathroom with B HHA. No LOB. Does report legs feeling heavy. Pt returned to bed with alarm set, needs in reach. RN notified.    Abigail Ortega Mobility Specialist 09/27/23, 3:45 PM

## 2023-09-27 NOTE — Progress Notes (Signed)
 PROGRESS NOTE    Abigail Ortega  [id number]:[id number] DOB: 05/21/1951 DOA: 09/18/2023 PCP: Margaretann Loveless, MD  201A/[id number]-AA  LOS: 8 days   Brief hospital course:   Assessment & Plan: Abigail Ortega is a 73 y.o. female with medical history significant for CAD s/p DES, breast CA s/p chemorads and mastectomy, hypothyroidism, HTN, chronic hypotension on midodrine, and C. difficile colitis December 2024 treated with vancomycin, rehospitalized from 1/16 to 08/20/2023 with recurrent diarrhea ruled out for C. 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  Stool study positive for C. difficile antigen and toxin.  Dificid was started.   Recurrent C. difficile colitis. Patient had a transient hypotension due to dehydration from diarrhea.  Received IV fluids, blood pressure is better. Stool study came back positive for C. difficile pathogen as well as toxin.  This is the second time patient has C. difficile colitis diagnosed.  Patient be treated with Dificid. Patient had temperature 100.4 3/6, but clinically improving, diarrhea is slowing down.  KUB did not show any evidence of megacolon. 3/7. Patient still has multiple loose stools a day, but amount of smaller.  Patient has good appetite without nausea vomiting.  I will continue Dificid, also added Creon. Per infective disease pharmacy recommendation, patient will be treated with Dificid twice a day for 5 days if condition improves,, followed with 200 mg every other day for 20 days. --cont Dificid 200 mg BID --trial colestipol to slow down bowel movement, avoid Lomotil, per discussion with GI  Mild orthostasis --s/p gentle MIVF --repeat orthostatic BP  tomorrow  Hypokalemia Hypomagnesemia Hypophosphatemia. --monitor and supplement PRN  Hyponatremia.   Anemia of chronic disease  Recent iron study and B12 study are within normal limits.  Hemoglobin has been stable.  Reactive thrombocytosis.   Chronic hypotension. --cont midodrine   COPD. Stable,   Coronary artery disease  Stable.  Weakness --PT/OT     DVT prophylaxis: Lovenox SQ Code Status: Full code  Family Communication:  Level of care: Med-Surg Dispo:   The patient is from: home Anticipated d/c is to: home Anticipated d/c date is: 2-3 days   Subjective and Interval History:  More episodes of diarrhea yesterday, and continued to have abdominal cramping.   Objective: Vitals:   09/26/23 2044 09/27/23 0351 09/27/23 0820 09/27/23 1540  BP: 114/78 107/66 116/72 114/68  Pulse: 81 84 80 80  Resp: 18 16 18    Temp: 98.6 F (37 C) 98.7 F (37.1 C) 98.4 F (36.9 C) 98.5 F (36.9 C)  TempSrc:   Oral Oral  SpO2: 100% 97% 100% 100%  Weight:      Height:        Intake/Output Summary (Last 24 hours) at 09/27/2023 1935 Last data filed at 09/27/2023 1045 Gross per 24 hour  Intake 100 ml  Output --  Net 100 ml   Filed Weights   09/18/23 1522  Weight: 52.2 kg    Examination:   Constitutional: NAD, AAOx3 HEENT: conjunctivae and lids normal, EOMI CV: No cyanosis.   RESP: normal respiratory effort, on RA Neuro: II - XII grossly intact.   Psych: Normal mood and affect.  Appropriate judgement and reason   Data Reviewed: I have personally reviewed labs and imaging studies  Time spent: 35 minutes  Inetta Fermo  Fran Lowes, MD [facility] If 7PM-7AM, please contact night-coverage 09/27/2023, 7:35 PM

## 2023-09-27 NOTE — Plan of Care (Signed)
   Problem: Education: Goal: Knowledge of General Education information will improve Description Including pain rating scale, medication(s)/side effects and non-pharmacologic comfort measures Outcome: Progressing   Problem: Health Behavior/Discharge Planning: Goal: Ability to manage health-related needs will improve Outcome: Progressing

## 2023-09-28 DIAGNOSIS — A09 Infectious gastroenteritis and colitis, unspecified: Secondary | ICD-10-CM | POA: Diagnosis not present

## 2023-09-28 MED ORDER — ACETAMINOPHEN 500 MG PO TABS
1000.0000 mg | ORAL_TABLET | Freq: Three times a day (TID) | ORAL | Status: DC | PRN
  Administered 2023-09-29 – 2023-10-04 (×3): 1000 mg via ORAL
  Filled 2023-09-28 (×3): qty 2

## 2023-09-28 MED ORDER — HYDROCODONE-ACETAMINOPHEN 5-325 MG PO TABS
1.0000 | ORAL_TABLET | ORAL | Status: DC | PRN
  Administered 2023-09-28 – 2023-10-05 (×5): 1 via ORAL
  Filled 2023-09-28 (×5): qty 1

## 2023-09-28 MED ORDER — COLESTIPOL HCL 1 G PO TABS
3.0000 g | ORAL_TABLET | Freq: Two times a day (BID) | ORAL | Status: DC
  Administered 2023-09-28 – 2023-10-04 (×12): 3 g via ORAL
  Filled 2023-09-28 (×13): qty 3

## 2023-09-28 MED ORDER — ACETAMINOPHEN 500 MG PO TABS: 1000.0000 mg | ORAL_TABLET | Freq: Three times a day (TID) | ORAL | Status: DC | PRN

## 2023-09-28 NOTE — Progress Notes (Signed)
 PROGRESS NOTE    Abigail Ortega  [id number]:[id number] DOB: 1951/04/19 DOA: 09/18/2023 PCP: Margaretann Loveless, MD  201A/201A-AA  LOS: 9 days   Brief hospital course:   Assessment & Plan: Abigail Ortega is a 73 y.o. female with medical history significant for CAD s/p DES, breast CA s/p chemorads and mastectomy, hypothyroidism, HTN, chronic hypotension on midodrine, and C. difficile colitis December 2024 treated with vancomycin, rehospitalized from 1/16 to 08/20/2023 with recurrent diarrhea ruled out for C. 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  Stool study positive for C. difficile antigen and toxin.  Dificid was started.   Recurrent C. difficile colitis. Patient had a transient hypotension due to dehydration from diarrhea.  Received IV fluids, blood pressure is better. Stool study came back positive for C. difficile pathogen as well as toxin.  This is the second time patient has C. difficile colitis diagnosed.  Patient be treated with Dificid. Patient had temperature 100.4 3/6, but clinically improving, diarrhea is slowing down.  KUB did not show any evidence of megacolon. 3/7. Patient still has multiple loose stools a day, but amount of smaller.  Patient has good appetite without nausea vomiting.  I will continue Dificid, also added Creon. Per infective disease pharmacy recommendation, patient will be treated with Dificid twice a day for 5 days if condition improves,, followed with 200 mg every other day for 20 days. --cont Dificid 200 mg BID --trial colestipol to slow down bowel movement, avoid Lomotil, per discussion with GI --increase colestipol  Mild orthostasis --s/p gentle  MIVF  Hypokalemia Hypomagnesemia Hypophosphatemia. --monitor and supplement PRN  Hyponatremia.   Anemia of chronic disease  Recent iron study and B12 study are within normal limits.  Hemoglobin has been stable.  Reactive thrombocytosis.   Chronic hypotension. --cont midodrine   COPD. Stable,   Coronary artery disease  Stable.  Weakness --PT/OT     DVT prophylaxis: Lovenox SQ Code Status: Full code  Family Communication: son updated at bedside today Level of care: Med-Surg Dispo:   The patient is from: home Anticipated d/c is to: home Anticipated d/c date is: 2-3 days   Subjective and Interval History:  Pt continued to complain about crampy pain and urge to have bowel movement.  Pt said she can not go home because she is too weak.     Objective: Vitals:   09/27/23 2013 09/28/23 0546 09/28/23 0944 09/28/23 1506  BP: 116/73 104/60 103/64 122/65  Pulse: 79 82 85 75  Resp: 16 16 16 11   Temp: 98.5 F (36.9 C) 98.5 F (36.9 C) 98.7 F (37.1 C) 98.7 F (37.1 C)  TempSrc:      SpO2: 100% 100% 99% 99%  Weight:      Height:        Intake/Output Summary (Last 24 hours) at 09/28/2023 1927 Last data filed at 09/28/2023 1804 Gross per 24 hour  Intake 720 ml  Output 300 ml  Net 420 ml   Filed Weights   09/18/23 1522  Weight: 52.2 kg    Examination:   Constitutional: NAD, AAOx3 HEENT: conjunctivae and lids normal, EOMI CV: No cyanosis.   RESP: normal respiratory effort, on RA Neuro: II - XII grossly intact.   Psych: Normal mood and affect.    Data  Reviewed: I have personally reviewed labs and imaging studies  Time spent: 35 minutes  Darlin Priestly, MD [facility] If 7PM-7AM, please contact night-coverage 09/28/2023, 7:27 PM

## 2023-09-28 NOTE — Plan of Care (Signed)
 Alert and oriented. Multiple bowel movements today. Bowel movements appear water-like with mucous streaks.  Dr. Fran Lowes made aware of consistency and appearance of stools.  Medicated for pain and cramping with PRN medications, see MAR for details.  Family at bedside today during shift.  Patient declined to mobilize with RN and Therapy.   Problem: Education: Goal: Knowledge of General Education information will improve Description: Including pain rating scale, medication(s)/side effects and non-pharmacologic comfort measures Outcome: Progressing   Problem: Health Behavior/Discharge Planning: Goal: Ability to manage health-related needs will improve Outcome: Progressing   Problem: Clinical Measurements: Goal: Ability to maintain clinical measurements within normal limits will improve Outcome: Progressing Goal: Will remain free from infection Outcome: Progressing Goal: Diagnostic test results will improve Outcome: Progressing

## 2023-09-28 NOTE — Progress Notes (Signed)
 PT Cancellation Note  Patient Details Name: Abigail Ortega MRN: [id number] DOB: [birth year]   Cancelled Treatment:     Pt received in bed, lunch on bedside tray table, friend in room. Pt refused any activity, therapist offered to assist OOB for lunch, pt declined. Will re-attempt next available date/time per POC.    Jannet Askew 09/28/2023, 3:15 PM

## 2023-09-28 NOTE — Plan of Care (Signed)

## 2023-09-29 DIAGNOSIS — A09 Infectious gastroenteritis and colitis, unspecified: Secondary | ICD-10-CM | POA: Diagnosis not present

## 2023-09-29 MED ORDER — MIDODRINE HCL 5 MG PO TABS
10.0000 mg | ORAL_TABLET | Freq: Three times a day (TID) | ORAL | Status: DC
  Administered 2023-09-29 – 2023-10-05 (×19): 10 mg via ORAL
  Filled 2023-09-29 (×19): qty 2

## 2023-09-29 NOTE — [place] FL2 (Signed)
 [place] MEDICAID FL2 LEVEL OF CARE FORM     IDENTIFICATION  Patient Name: Abigail Ortega Birthdate: 02-Nov-1950 Sex: female Admission Date (Current Location): 09/18/2023  [place] and [place] Number:  Chiropodist and Address:  [facility], [address], [place], [place] [address]      Provider Number: [id number]  Attending Physician Name and Address:  Darlin Priestly, MD  Relative Name and Phone Number:  Enoch,Timothy (Son)  [phone] (Mobile)    Current Level of Care: Hospital Recommended Level of Care: Skilled Nursing Facility Prior Approval Number:    Date Approved/Denied:   PASRR Number: [id number]  Discharge Plan:      Current Diagnoses: Patient Active Problem List   Diagnosis Date Noted   Hypophosphatemia 09/20/2023   C. difficile colitis 09/19/2023   History of Clostridioides difficile colitis 06/2023 09/18/2023   Loss of weight 08/13/2023   Diarrhea, recurrent 08/13/2023   Diarrhea of presumed infectious origin 08/12/2023   Hypomagnesemia 08/06/2023   Normocytic anemia 08/04/2023   Folate deficiency 07/01/2023   Malnutrition of moderate degree 06/30/2023   Hypokalemia 06/28/2023   Coronary artery disease with stable angina 06/28/2023   Chronic obstructive lung disease (HCC) 06/28/2023   Dyspepsia 01/30/2023   Prediabetes 01/30/2023   Gastroesophageal reflux disease without esophagitis 01/30/2023   Other irritable bowel syndrome 01/30/2023   Cervicalgia 11/28/2022   Hemorrhoids 11/28/2022   COPD (chronic obstructive pulmonary disease) with acute bronchitis (HCC) 09/29/2022   Osteoporosis 10/25/2021   Anemia of chronic disease 06/23/2019   Coronary syndrome, acute (HCC) 08/23/2018   History of colonic polyps 12/23/2016   Hyponatremia 12/21/2016   Depression 01/14/2016   Headache 01/14/2016   Joint pain 01/14/2016   Myocardial infarction (HCC) 01/14/2016   Anxiety 12/16/2013   Breast cancer (HCC) 12/16/2013    CAD (coronary artery disease) 12/16/2013   Hyperlipidemia, unspecified 12/16/2013   Chronic hypotension 12/16/2013   Severe hypothyroidism 12/16/2013   Acquired absence of breast 03/04/2013   Chronic combined systolic and diastolic CHF (congestive heart failure) (HCC) 03/04/2013   Breast cancer of lower-inner quadrant of right female breast (HCC) 01/28/2013    Orientation RESPIRATION BLADDER Height & Weight     Self, Situation, Place  Normal Continent Weight: 115 lb (52.2 kg) Height:  5\' 5"  (165.1 cm)  BEHAVIORAL SYMPTOMS/MOOD NEUROLOGICAL BOWEL NUTRITION STATUS      Incontinent Diet (heart)  AMBULATORY STATUS COMMUNICATION OF NEEDS Skin   Supervision   Normal                       Personal Care Assistance Level of Assistance  Bathing, Feeding, Dressing Bathing Assistance: Limited assistance Feeding assistance: Independent Dressing Assistance: Limited assistance     Functional Limitations Info  Sight Sight Info: Impaired (glasses)        SPECIAL CARE FACTORS FREQUENCY  PT (By licensed PT), OT (By licensed OT)     PT Frequency: 5 times per week OT Frequency: 5 times per week            Contractures      Additional Factors Info  Code Status, Allergies Code Status Info: full Allergies Info: Nausea Control (Emetrol), Other, Effexor (Venlafaxine), Hydroxyzine           Current Medications (09/29/2023):  This is the current hospital active medication list Current Facility-Administered Medications  Medication Dose Route Frequency Provider Last Rate Last Admin   acetaminophen (TYLENOL) tablet 1,000 mg  1,000 mg Oral TID  PRN Darlin Priestly, MD       atorvastatin (LIPITOR) tablet 80 mg  80 mg Oral Daily Andris Baumann, MD   80 mg at 09/29/23 1610   clopidogrel (PLAVIX) tablet 75 mg  75 mg Oral Daily Marrion Coy, MD   75 mg at 09/29/23 0837   colestipol (COLESTID) tablet 3 g  3 g Oral BID Darlin Priestly, MD   3 g at 09/29/23 0837   dicyclomine (BENTYL) capsule 10 mg  10  mg Oral TID AC Andris Baumann, MD   10 mg at 09/29/23 1307   enoxaparin (LOVENOX) injection 40 mg  40 mg Subcutaneous Q24H Darlin Priestly, MD   40 mg at 09/28/23 2054   HYDROcodone-acetaminophen (NORCO/VICODIN) 5-325 MG per tablet 1 tablet  1 tablet Oral Q4H PRN Darlin Priestly, MD   1 tablet at 09/28/23 2056   levothyroxine (SYNTHROID) tablet 88 mcg  88 mcg Oral Q0600 Andris Baumann, MD   88 mcg at 09/29/23 0524   lipase/protease/amylase (CREON) capsule 24,000 Units  24,000 Units Oral TID Valentino Hue, MD   24,000 Units at 09/29/23 1307   methocarbamol (ROBAXIN) tablet 500 mg  500 mg Oral Q6H PRN Mansy, Jan A, MD   500 mg at 09/28/23 1303   midodrine (PROAMATINE) tablet 10 mg  10 mg Oral TID WC Darlin Priestly, MD   10 mg at 09/29/23 1308   ondansetron (ZOFRAN) injection 4 mg  4 mg Intravenous Q6H PRN Darlin Priestly, MD   4 mg at 09/28/23 1020   ondansetron (ZOFRAN-ODT) disintegrating tablet 4 mg  4 mg Oral Q8H PRN Darlin Priestly, MD       Oral care mouth rinse  15 mL Mouth Rinse PRN Marrion Coy, MD       pantoprazole (PROTONIX) EC tablet 40 mg  40 mg Oral Daily Lindajo Royal V, MD   40 mg at 09/29/23 9604   polyvinyl alcohol (LIQUIFILM TEARS) 1.4 % ophthalmic solution 1 drop  1 drop Both Eyes PRN Marrion Coy, MD   1 drop at 09/28/23 2105   traZODone (DESYREL) tablet 100 mg  100 mg Oral QHS PRN Andris Baumann, MD   100 mg at 09/28/23 2055     Discharge Medications: Please see discharge summary for a list of discharge medications.  Relevant Imaging Results:  Relevant Lab Results:   Additional Information SS #: [id number]  Lynniah Janoski E Jerald Hennington, LCSW

## 2023-09-29 NOTE — Progress Notes (Addendum)
 Physical Therapy Treatment Patient Details Name: Abigail Ortega MRN: [id number] DOB: 1951-02-12 Today's Date: 09/29/2023   History of Present Illness Pt is a 73 yo female that presented to ED for watery, bloody diarrhea, workup positive for cdiff. Noted to have had recent hospitalizations (dec 2024 and 08/02/2023-08/20/2023) for similar concerns, PMH of  CAD s/p DES, breast CA s/p chemorads and mastectomy, hypothyroidism, HTN, chronic hypotension on midodrine, COPD.    PT Comments  Patient received in bed, she is pleasant and agrees to PT session. BP seated was 87/55, after ambulating  120/64. Patient is independent with bed mobility and transfers with supervision. Patient ambulated 20 feet x 2 with RW and supervision/cga. She then transferred with supervision to [facility] and back to bed. Patient is not requiring much physical assist at all at this time. She will continue to benefit from skilled PT while here to improve strength and independence.      If plan is discharge home, recommend the following: A little help with walking and/or transfers;A little help with bathing/dressing/bathroom;Assist for transportation;Help with stairs or ramp for entrance   Can travel by private vehicle      yes  Equipment Recommendations  None recommended by PT    Recommendations for Other Services       Precautions / Restrictions Precautions Precautions: Fall Restrictions Weight Bearing Restrictions Per Provider Order: No     Mobility  Bed Mobility Overal bed mobility: Modified Independent Bed Mobility: Supine to Sit, Sit to Supine     Supine to sit: Modified independent (Device/Increase time) Sit to supine: Modified independent (Device/Increase time)   General bed mobility comments: no physical assist needed with bed mobility, increased time    Transfers Overall transfer level: Needs assistance Equipment used: Rolling walker (2 wheels) Transfers: Sit to/from Stand, Bed to  chair/wheelchair/BSC Sit to Stand: Supervision   Step pivot transfers: Supervision            Ambulation/Gait Ambulation/Gait assistance: Supervision Gait Distance (Feet): 20 Feet (20 feet x 2 reps) Assistive device: Rolling walker (2 wheels) Gait Pattern/deviations: Step-through pattern, Decreased step length - right, Decreased step length - left, Decreased stride length, Trunk flexed Gait velocity: decr     General Gait Details: Ambulated to door and back x2 reps. Then transferred to [facility] and back to bed independently. (supervision)   Stairs             Wheelchair Mobility     Tilt Bed    Modified Rankin (Stroke Patients Only)       Balance Overall balance assessment: Modified Independent Sitting-balance support: Feet supported Sitting balance-Leahy Scale: Normal     Standing balance support: Bilateral upper extremity supported, During functional activity Standing balance-Leahy Scale: Good Standing balance comment: able to stand and pivot to [facility] and back to bed without RW, holding to bed or BSC with single UE                            Communication Communication Communication: No apparent difficulties  Cognition Arousal: Alert Behavior During Therapy: WFL for tasks assessed/performed   PT - Cognitive impairments: No apparent impairments                         Following commands: Intact Following commands impaired: Follows one step commands with increased time    Cueing Cueing Techniques: Verbal cues  Exercises      General Comments  Pertinent Vitals/Pain Pain Assessment Pain Assessment: No/denies pain Pain Intervention(s): Monitored during session    Home Living                          Prior Function            PT Goals (current goals can now be found in the care plan section) Acute Rehab PT Goals Patient Stated Goal: to feel better PT Goal Formulation: With patient Time For Goal  Achievement: 10/04/23 Potential to Achieve Goals: Good Progress towards PT goals: Progressing toward goals    Frequency    Min 1X/week      PT Plan      Co-evaluation              AM-PAC PT "6 Clicks" Mobility   Outcome Measure  Help needed turning from your back to your side while in a flat bed without using bedrails?: None Help needed moving from lying on your back to sitting on the side of a flat bed without using bedrails?: None Help needed moving to and from a bed to a chair (including a wheelchair)?: None Help needed standing up from a chair using your arms (e.g., wheelchair or bedside chair)?: None Help needed to walk in hospital room?: A Little Help needed climbing 3-5 steps with a railing? : A Little 6 Click Score: 22    End of Session Equipment Utilized During Treatment: Gait belt Activity Tolerance: Patient tolerated treatment well Patient left: in bed;with call bell/phone within reach;with bed alarm set Nurse Communication: Mobility status PT Visit Diagnosis: Muscle weakness (generalized) (M62.81)     Time: 1914-7829 PT Time Calculation (min) (ACUTE ONLY): 29 min  Charges:    $Gait Training: 8-22 mins $Therapeutic Activity: 8-22 mins PT General Charges $$ ACUTE PT VISIT: 1 Visit                     Immanuel Fedak, PT, GCS 09/29/23,3:35 PM

## 2023-09-29 NOTE — Plan of Care (Signed)
  Problem: Education: Goal: Knowledge of General Education information will improve Description: Including pain rating scale, medication(s)/side effects and non-pharmacologic comfort measures Outcome: Progressing   Problem: Health Behavior/Discharge Planning: Goal: Ability to manage health-related needs will improve Outcome: Progressing   Problem: Clinical Measurements: Goal: Diagnostic test results will improve Outcome: Progressing Goal: Respiratory complications will improve Outcome: Progressing   Problem: Activity: Goal: Risk for activity intolerance will decrease Outcome: Progressing   Problem: Nutrition: Goal: Adequate nutrition will be maintained Outcome: Progressing   Problem: Elimination: Goal: Will not experience complications related to bowel motility Outcome: Progressing   Problem: Pain Managment: Goal: General experience of comfort will improve and/or be controlled Outcome: Progressing   Problem: Safety: Goal: Ability to remain free from injury will improve Outcome: Progressing   Problem: Skin Integrity: Goal: Risk for impaired skin integrity will decrease Outcome: Progressing

## 2023-09-29 NOTE — TOC Progression Note (Addendum)
 Transition of Care [facility]) - Progression Note    Patient Details  Name: Abigail Ortega MRN: [id number] Date of Birth: [birth year]  Transition [facility]) CM/SW Contact  Liliana Cline, LCSW Phone Number: 09/29/2023, 3:58 PM  Clinical Narrative:    PT rec changed to SNF. CSW spoke to patient who is agreeable to SNF, she wants TOC to relay updates to son Timmy about SNF process. She prefers to return to [facility] where she has been in the past.  CSW called son, LVM with update.        Expected Discharge Plan and Services                                               Social Determinants of Health (SDOH) Interventions SDOH Screenings   Food Insecurity: No Food Insecurity (09/18/2023)  Housing: Unknown (09/18/2023)  Transportation Needs: No Transportation Needs (09/18/2023)  Utilities: Not At Risk (09/18/2023)  Social Connections: Moderately Integrated (09/18/2023)  Tobacco Use: Medium Risk (09/18/2023)    Readmission Risk Interventions     No data to display

## 2023-09-29 NOTE — Progress Notes (Signed)
 PROGRESS NOTE    MIYEKO Ortega  ZOX:[id number] DOB: [birth year] DOA: 09/18/2023 PCP: Margaretann Loveless, MD  [id number]-AA  LOS: 10 days   Brief hospital course:   Assessment & Plan: Abigail Ortega is a 73 y.o. female with medical history significant for CAD s/p DES, breast CA s/p chemorads and mastectomy, hypothyroidism, HTN, chronic hypotension on midodrine, and C. difficile colitis December 2024 treated with vancomycin, rehospitalized from 1/16 to 08/20/2023 with recurrent diarrhea ruled out for C. 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  Stool study positive for C. difficile antigen and toxin.  Dificid was started.   Recurrent C. difficile colitis. Patient had a transient hypotension due to dehydration from diarrhea.  Received IV fluids, blood pressure is better. Stool study came back positive for C. difficile pathogen as well as toxin.  This is the second time patient has C. difficile colitis diagnosed.  Patient be treated with Dificid. Patient had temperature 100.4 3/6, but clinically improving, diarrhea is slowing down.  KUB did not show any evidence of megacolon. 3/7. Patient still has multiple loose stools a day, but amount of smaller.  Patient has good appetite without nausea vomiting.  I will continue Dificid, also added Creon. Per infective disease pharmacy recommendation, patient will be treated with Dificid twice a day for 5 days if condition improves,, followed with 200 mg every other day for 20 days. --completed Dificid 200 mg BID x 10 days --trial colestipol to slow down bowel movement, avoid Lomotil, per discussion with GI --cont colestipol  Mild orthostasis --s/p gentle  MIVF  Hypokalemia Hypomagnesemia Hypophosphatemia. --monitor and supplement PRN  Hyponatremia.   Anemia of chronic disease  Recent iron study and B12 study are within normal limits.  Hemoglobin has been stable.  Reactive thrombocytosis.   Chronic hypotension. --increase midodrine today   COPD. Stable,   Coronary artery disease  Stable.  Weakness --PT/OT --SNF rehab     DVT prophylaxis: Lovenox SQ Code Status: Full code  Family Communication:  Level of care: Med-Surg Dispo:   The patient is from: home Anticipated d/c is to: SNF rehab Anticipated d/c date is: whenever bed   Subjective and Interval History:  Pt reported everything was the same, same frequent mucousy output per rectum, same abdominal pain and cramping.  Still reported too weak to go home.   Objective: Vitals:   09/29/23 0500 09/29/23 0759 09/29/23 0847 09/29/23 1501  BP: (!) 88/60 (!) 100/55 (!) 87/56 98/61  Pulse: 92 84 84 78  Resp:  18 16 16   Temp: 97.9 F (36.6 C) 98.3 F (36.8 C) 98.2 F (36.8 C) 98.2 F (36.8 C)  TempSrc: Oral   Axillary  SpO2: 98% 100% 99% 100%  Weight:      Height:        Intake/Output Summary (Last 24 hours) at 09/29/2023 1725 Last data filed at 09/29/2023 1500 Gross per 24 hour  Intake 480 ml  Output 300 ml  Net 180 ml   Filed Weights   09/18/23 1522  Weight: 52.2 kg    Examination:   Constitutional: NAD, AAOx3 HEENT: conjunctivae and lids normal, EOMI CV: No cyanosis.   RESP: normal respiratory effort, on RA Neuro: II - XII grossly intact.     Data Reviewed: I have  personally reviewed labs and imaging studies  Time spent: 35 minutes  Darlin Priestly, MD [facility] If 7PM-7AM, please contact night-coverage 09/29/2023, 5:25 PM

## 2023-09-30 DIAGNOSIS — A0471 Enterocolitis due to Clostridium difficile, recurrent: Secondary | ICD-10-CM | POA: Diagnosis not present

## 2023-09-30 NOTE — Progress Notes (Signed)
 PROGRESS NOTE    Abigail Ortega  ZHY:[id number] DOB: July 24, 1950 DOA: 09/18/2023 PCP: Abigail Loveless, MD  201A/[id number]-AA  LOS: 11 days   Brief hospital course:   Assessment & Plan: [name] is a 73 y.o. female with medical history significant for CAD s/p DES, breast CA s/p chemorads and mastectomy, hypothyroidism, HTN, chronic hypotension on midodrine, and C. difficile colitis December 2024 treated with vancomycin, rehospitalized from 1/16 to 08/20/2023 with recurrent diarrhea ruled out for C. 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  Stool study positive for C. difficile antigen and toxin.  Dificid was started.   Recurrent C. difficile colitis. Patient had a transient hypotension due to dehydration from diarrhea.  Received IV fluids, blood pressure is better. Stool study came back positive for C. difficile pathogen as well as toxin.  This is the second time patient has C. difficile colitis diagnosed.  Patient be treated with Dificid. Patient had temperature 100.4 3/6, but clinically improving, diarrhea is slowing down.  KUB did not show any evidence of megacolon. 3/7. Patient still has multiple loose stools a day, but amount of smaller.  Patient has good appetite without nausea vomiting.  I will continue Dificid, also added Creon. Per infective disease pharmacy recommendation, patient will be treated with Dificid twice a day for 5 days if condition improves,, followed with 200 mg every other day for 20 days. --completed Dificid 200 mg BID x 10 days --outpatient f/u with Dr. Tobi Bastos for possible stool transplant if tx failed  Abdominal pain and cramping  --pt reported no improvement at all.  Pain and cramps with  passing mucousy stool. --per discussion with GI, trial colestipol to slow down bowel movement, avoid Lomotil. --cont colestipol --cont Bentyl  Mild orthostasis --s/p gentle MIVF  Hypokalemia Hypomagnesemia Hypophosphatemia. --monitor and supplement PRN  Hyponatremia.   Anemia of chronic disease  Recent iron study and B12 study are within normal limits.  Hemoglobin has been stable.  Reactive thrombocytosis.   Chronic hypotension. --cont midodrine 10 TID   COPD. Stable,   Coronary artery disease  Stable.  Weakness --PT/OT --SNF rehab     DVT prophylaxis: Lovenox SQ Code Status: Full code  Family Communication:  Level of care: Med-Surg Dispo:   The patient is from: home Anticipated d/c is to: SNF rehab Anticipated d/c date is: whenever bed available   Subjective and Interval History:  Continued to report everything remained the same.   Objective: Vitals:   09/29/23 2056 09/30/23 0349 09/30/23 0920 09/30/23 1423  BP: 92/71 (!) 110/53 90/69 (!) 123/44  Pulse: 75 69 71 73  Resp: 18 18 18 18   Temp: 99.1 F (37.3 C) 98.3 F (36.8 C) 98.2 F (36.8 C) 98.2 F (36.8 C)  TempSrc: Oral Oral    SpO2: 100% 100% 100% 99%  Weight:      Height:        Intake/Output Summary (Last 24 hours) at 09/30/2023 1556 Last data filed at 09/30/2023 1500 Gross per 24 hour  Intake 120 ml  Output --  Net 120 ml   Filed Weights   09/18/23 1522  Weight: 52.2 kg    Examination:   Constitutional: NAD, AAOx3 HEENT: conjunctivae and lids normal, EOMI CV: No cyanosis.   RESP: normal respiratory effort,  on RA Neuro: II - XII grossly intact.     Data Reviewed: I have personally reviewed labs and imaging studies  Time spent: 35 minutes  Darlin Priestly, MD [facility] If 7PM-7AM, please contact night-coverage 09/30/2023, 3:56 PM

## 2023-10-01 DIAGNOSIS — A09 Infectious gastroenteritis and colitis, unspecified: Secondary | ICD-10-CM | POA: Diagnosis not present

## 2023-10-01 LAB — BASIC METABOLIC PANEL
Anion gap: 6 (ref 5–15)
BUN: 8 mg/dL (ref 8–23)
CO2: 23 mmol/L (ref 22–32)
Calcium: 8.5 mg/dL — ABNORMAL LOW (ref 8.9–10.3)
Chloride: 105 mmol/L (ref 98–111)
Creatinine, Ser: 0.87 mg/dL (ref 0.44–1.00)
GFR, Estimated: >60 mL/min (ref >60)
Glucose, Bld: 84 mg/dL (ref 70–99)
Potassium: 3.2 mmol/L — ABNORMAL LOW (ref 3.5–5.1)
Sodium: 134 mmol/L — ABNORMAL LOW (ref 135–145)

## 2023-10-01 LAB — CBC
HCT: 27.5 % — ABNORMAL LOW (ref 36.0–46.0)
Hemoglobin: 9.5 g/dL — ABNORMAL LOW (ref 12.0–15.0)
MCH: 31.8 pg (ref 26.0–34.0)
MCHC: 34.5 g/dL (ref 30.0–36.0)
MCV: 92 fL (ref 80.0–100.0)
Platelets: 421 10*3/uL — ABNORMAL HIGH (ref 150–400)
RBC: 2.99 MIL/uL — ABNORMAL LOW (ref 3.87–5.11)
RDW: 14.8 % (ref 11.5–15.5)
WBC: 5.8 10*3/uL (ref 4.0–10.5)
nRBC: 0 % (ref 0.0–0.2)

## 2023-10-01 LAB — MAGNESIUM: Magnesium: 1.6 mg/dL — ABNORMAL LOW (ref 1.7–2.4)

## 2023-10-01 MED ORDER — MAGNESIUM SULFATE 2 GM/50ML IV SOLN: 2.0000 g | Freq: Once | INTRAVENOUS | Status: DC

## 2023-10-01 MED ORDER — MAGNESIUM SULFATE 4 GM/100ML IV SOLN
4.0000 g | Freq: Once | INTRAVENOUS | Status: AC
  Administered 2023-10-01: 4 g via INTRAVENOUS
  Filled 2023-10-01: qty 100

## 2023-10-01 MED ORDER — POTASSIUM CHLORIDE CRYS ER 20 MEQ PO TBCR
40.0000 meq | EXTENDED_RELEASE_TABLET | Freq: Once | ORAL | Status: AC
  Administered 2023-10-01: 40 meq via ORAL
  Filled 2023-10-01: qty 2

## 2023-10-01 NOTE — TOC Progression Note (Signed)
 Transition of Care [facility]) - Progression Note    Patient Details  Name: Abigail Ortega MRN: [id number] Date of Birth: 1950/08/30  [facility]) CM/SW Contact  Chapman Fitch, RN Phone Number: 10/01/2023, 4:40 PM  Clinical Narrative:      Per Tobi Bastos at [facility] they can work on a payment plan for copays if patient/son are in agreement.   VM left for son and provided him with the contact information so he can follow up directly with [facility]  If not they will proceed with [facility].  Auth will have to be obtained for either location      Expected Discharge Plan and Services                                               Social Determinants of Health (SDOH) Interventions SDOH Screenings   Food Insecurity: No Food Insecurity (09/18/2023)  Housing: Unknown (09/18/2023)  Transportation Needs: No Transportation Needs (09/18/2023)  Utilities: Not At Risk (09/18/2023)  Social Connections: Moderately Integrated (09/18/2023)  Tobacco Use: Medium Risk (09/18/2023)    Readmission Risk Interventions     No data to display

## 2023-10-01 NOTE — Consult Note (Signed)
 [facility] Liaison Consult Note   10/01/2023  SHWANDA SOLTIS 04/13/1951 [id number]  Update:  Pt currently pending SNF placement based upon accommodations from the accepting facility. The facility will continue to address her needs post hospital discharge.  [facility] does not replace or interfere with TOC Transition of care care management services or arrangements on post discharge discharge.  Please contact hospital with any concerns or inquires:  Elliot Cousin, RN, [facility] Liaison [place]   [facility], Population Health Office Hours MTWF  8:00 am-6:00 pm Direct Dial: [phone] mobile [email or web address]

## 2023-10-01 NOTE — TOC Progression Note (Signed)
 Transition of Care [facility]) - Progression Note    Patient Details  Name: Abigail Ortega MRN: [id number] Date of Birth: 1950-11-03  Transition of Care [facility]) CM/SW Contact  Chapman Fitch, RN Phone Number: 10/01/2023, 12:10 PM  Clinical Narrative:        Bed offer presented patient of [facility] Patient requests for [facility] to reach out to son Janet Berlin would like to hear back from [facility] before proceeding with [facility].  If [facility] is unable to offer he will accept [facility] sent to MD to determine when it is anticipated that patient will be ready for auth to be initiated     Expected Discharge Plan and Services                                               Social Determinants of Health (SDOH) Interventions SDOH Screenings   Food Insecurity: No Food Insecurity (09/18/2023)  Housing: Unknown (09/18/2023)  Transportation Needs: No Transportation Needs (09/18/2023)  Utilities: Not At Risk (09/18/2023)  Social Connections: Moderately Integrated (09/18/2023)  Tobacco Use: Medium Risk (09/18/2023)    Readmission Risk Interventions     No data to display

## 2023-10-01 NOTE — Plan of Care (Signed)

## 2023-10-01 NOTE — Progress Notes (Signed)
 PROGRESS NOTE    Abigail Ortega  BMW:[id number] DOB: 1950-08-24 DOA: 09/18/2023 PCP: Margaretann Loveless, MD  201A/201A-AA  LOS: 12 days   Brief hospital course:   Assessment & Plan: Abigail Ortega is a 73 y.o. female with medical history significant for CAD s/p DES, breast CA s/p chemorads and mastectomy, hypothyroidism, HTN, chronic hypotension on midodrine, and C. difficile colitis December 2024 treated with vancomycin, rehospitalized from 1/16 to 08/20/2023 with recurrent diarrhea ruled out for C. 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  Stool study positive for C. difficile antigen and toxin.  Dificid was started.   Recurrent C. difficile colitis. Patient had a transient hypotension due to dehydration from diarrhea.  Received IV fluids, blood pressure is better. Stool study came back positive for C. difficile pathogen as well as toxin.  This is the second time patient has C. difficile colitis diagnosed.  Patient be treated with Dificid. Patient had temperature 100.4 3/6, but clinically improving, diarrhea is slowing down.  KUB did not show any evidence of megacolon. 3/7. Patient still has multiple loose stools a day, but amount of smaller.  Patient has good appetite without nausea vomiting.  I will continue Dificid, also added Creon. Per infective disease pharmacy recommendation, patient will be treated with Dificid twice a day for 5 days if condition improves,, followed with 200 mg every other day for 20 days. --completed Dificid 200 mg BID x 10 days --outpatient f/u with Dr. Tobi Bastos for possible stool transplant if tx failed  Abdominal pain and cramping  --pt reported no improvement at all.  Pain and cramps with  passing mucousy stool. --per discussion with GI, trial colestipol to slow down bowel movement, avoid Lomotil. --cont colestipol --cont Bentyl  Mild orthostasis --s/p gentle MIVF  Hypokalemia Hypomagnesemia Hypophosphatemia. --monitor and supplement PRN  Hyponatremia.   Anemia of chronic disease  Recent iron study and B12 study are within normal limits.  Hemoglobin has been stable.  Reactive thrombocytosis.   Chronic hypotension. --cont midodrine 10 TID   COPD. Stable,   Coronary artery disease  Stable.  Weakness --PT/OT --SNF rehab     DVT prophylaxis: Lovenox SQ Code Status: Full code  Family Communication:  Level of care: Med-Surg Dispo:   The patient is from: home Anticipated d/c is to: SNF rehab Anticipated d/c date is: whenever bed available   Subjective and Interval History:  Pt reported having a pieces of solid stool today, for the first time.   Objective: Vitals:   09/30/23 1928 10/01/23 0325 10/01/23 0759 10/01/23 1651  BP: 120/76 102/65 (!) 108/58 115/67  Pulse: 68 79 80 73  Resp: 20 16 16 18   Temp: 98.5 F (36.9 C) 98.6 F (37 C) 98.2 F (36.8 C) 98.4 F (36.9 C)  TempSrc: Oral Oral Oral Oral  SpO2: 100% 100%  100%  Weight:      Height:        Intake/Output Summary (Last 24 hours) at 10/01/2023 1929 Last data filed at 10/01/2023 1514 Gross per 24 hour  Intake 220 ml  Output --  Net 220 ml   Filed Weights   09/18/23 1522  Weight: 52.2 kg    Examination:   Constitutional: NAD, AAOx3 HEENT: conjunctivae and lids normal, EOMI CV: No cyanosis.  RESP: normal respiratory effort, on RA Neuro: II - XII grossly intact.   Psych: Normal mood and affect.    Data Reviewed: I have personally reviewed labs and imaging studies  Time spent: 25 minutes  Darlin Priestly, MD [facility] If 7PM-7AM, please contact night-coverage 10/01/2023, 7:29 PM

## 2023-10-01 NOTE — Progress Notes (Signed)
 Mobility Specialist - Progress Note   10/01/23 1400  Mobility  Activity Refused mobility     Pt in PT session on arrival. Will attempt another date/time.    Filiberto Pinks Mobility Specialist 10/01/23, 2:53 PM

## 2023-10-01 NOTE — Progress Notes (Signed)
 Physical Therapy Treatment Patient Details Name: Abigail Ortega MRN: [id number] DOB: 1950/12/24 Today's Date: 10/01/2023   History of Present Illness Pt is a 73 yo female that presented to ED for watery, bloody diarrhea, workup positive for cdiff. Noted to have had recent hospitalizations (dec 2024 and 08/02/2023-08/20/2023) for similar concerns, PMH of  CAD s/p DES, breast CA s/p chemorads and mastectomy, hypothyroidism, HTN, chronic hypotension on midodrine, COPD.    PT Comments  Pt is making good progress towards goals with ability to ambulate short distance in room, limited by urgent need for BM and reports slight dizziness with exertion. Reports she has only been able to transfer between surfaces this date due to weakness. Able to follow commands for RW. Declines to sit in recliner. Will continue to progress as able.    If plan is discharge home, recommend the following: A little help with walking and/or transfers;A little help with bathing/dressing/bathroom;Assist for transportation;Help with stairs or ramp for entrance   Can travel by private vehicle     Yes  Equipment Recommendations  None recommended by PT    Recommendations for Other Services       Precautions / Restrictions Precautions Precautions: Fall Recall of Precautions/Restrictions: Intact Restrictions Weight Bearing Restrictions Per Provider Order: No     Mobility  Bed Mobility Overal bed mobility: Modified Independent Bed Mobility: Supine to Sit, Sit to Supine     Supine to sit: Modified independent (Device/Increase time) Sit to supine: Modified independent (Device/Increase time)   General bed mobility comments: no physical assist needed with bed mobility, increased time    Transfers Overall transfer level: Needs assistance Equipment used: Rolling walker (2 wheels) Transfers: Sit to/from Stand, Bed to chair/wheelchair/BSC Sit to Stand: Supervision           General transfer comment: pt needs  cues for hand placement. Once standing, slightly flexed posture.    Ambulation/Gait Ambulation/Gait assistance: Supervision Gait Distance (Feet): 15 Feet Assistive device: Rolling walker (2 wheels) Gait Pattern/deviations: Step-through pattern, Decreased step length - right, Decreased step length - left, Decreased stride length, Trunk flexed       General Gait Details: once beginning ambulation, pt with urgent need to have a BM. Unsafe ambulation to [facility] and then was able to ambulate to door and back. Further distance limited due to dizziness   Stairs             Wheelchair Mobility     Tilt Bed    Modified Rankin (Stroke Patients Only)       Balance Overall balance assessment: Modified Independent Sitting-balance support: Feet supported Sitting balance-Leahy Scale: Normal     Standing balance support: Bilateral upper extremity supported, During functional activity Standing balance-Leahy Scale: Good                              Communication Communication Communication: No apparent difficulties  Cognition Arousal: Alert Behavior During Therapy: WFL for tasks assessed/performed   PT - Cognitive impairments: No apparent impairments                       PT - Cognition Comments: oriented, at times can be slightly hateful at times, and then remorseful Following commands: Intact Following commands impaired: Follows one step commands with increased time    Cueing Cueing Techniques: Verbal cues  Exercises Other Exercises Other Exercises: ambulated to [facility] with incontinent BM. Needs supervision for hygiene. Was  then able to stand at the sink for hand washing.    General Comments        Pertinent Vitals/Pain Pain Assessment Pain Assessment: No/denies pain    Home Living                          Prior Function            PT Goals (current goals can now be found in the care plan section) Acute Rehab PT Goals Patient Stated  Goal: to feel better PT Goal Formulation: With patient Time For Goal Achievement: 10/04/23 Potential to Achieve Goals: Good Progress towards PT goals: Progressing toward goals    Frequency    Min 1X/week      PT Plan      Co-evaluation              AM-PAC PT "6 Clicks" Mobility   Outcome Measure  Help needed turning from your back to your side while in a flat bed without using bedrails?: None Help needed moving from lying on your back to sitting on the side of a flat bed without using bedrails?: None Help needed moving to and from a bed to a chair (including a wheelchair)?: None Help needed standing up from a chair using your arms (e.g., wheelchair or bedside chair)?: None Help needed to walk in hospital room?: A Little Help needed climbing 3-5 steps with a railing? : A Little 6 Click Score: 22    End of Session   Activity Tolerance: Patient tolerated treatment well Patient left: in bed;with call bell/phone within reach;with bed alarm set Nurse Communication: Mobility status PT Visit Diagnosis: Muscle weakness (generalized) (M62.81)     Time: 1610-9604 PT Time Calculation (min) (ACUTE ONLY): 25 min  Charges:    $Gait Training: 8-22 mins $Therapeutic Activity: 8-22 mins PT General Charges $$ ACUTE PT VISIT: 1 Visit                     Elizabeth Palau, PT, DPT, GCS [phone]    Genisis Sonnier 10/01/2023, 3:23 PM

## 2023-10-01 NOTE — Plan of Care (Signed)

## 2023-10-02 DIAGNOSIS — A09 Infectious gastroenteritis and colitis, unspecified: Secondary | ICD-10-CM | POA: Diagnosis not present

## 2023-10-02 LAB — POTASSIUM: Potassium: 4.2 mmol/L (ref 3.5–5.1)

## 2023-10-02 LAB — MAGNESIUM: Magnesium: 2.5 mg/dL — ABNORMAL HIGH (ref 1.7–2.4)

## 2023-10-02 NOTE — Plan of Care (Signed)

## 2023-10-02 NOTE — TOC Progression Note (Signed)
 Transition of Care [facility]) - Progression Note    Patient Details  Name: Abigail Ortega MRN: [id number] Date of Birth: 04-Jan-1951  [facility] of [facility] CM/SW Contact  Chapman Fitch, RN Phone Number: 10/02/2023, 10:06 AM  Clinical Narrative:      [name] can offer, and son went this morning to work out a copay payment plan Requested [name] with TOC to start auth      Expected Discharge Plan and Services                                               Social Determinants of Health (SDOH) Interventions SDOH Screenings   Food Insecurity: No Food Insecurity (09/18/2023)  Housing: Unknown (09/18/2023)  Transportation Needs: No Transportation Needs (09/18/2023)  Utilities: Not At Risk (09/18/2023)  Social Connections: Moderately Integrated (09/18/2023)  Tobacco Use: Medium Risk (09/18/2023)    Readmission Risk Interventions     No data to display

## 2023-10-02 NOTE — Progress Notes (Signed)
 PROGRESS NOTE    Abigail Ortega  ZOX:[id number] DOB: Dec 03, 1950 DOA: 09/18/2023 PCP: Abigail Loveless, MD  [id number]  LOS: 13 days   Brief hospital course:   Assessment & Plan: Abigail Ortega is a 73 y.o. female with medical history significant for CAD s/p DES, breast CA s/p chemorads and mastectomy, hypothyroidism, HTN, chronic hypotension on midodrine, and C. difficile colitis December 2024 treated with vancomycin, rehospitalized from 1/16 to 08/20/2023 with recurrent diarrhea ruled out for C. 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  Stool study positive for C. difficile antigen and toxin.  Dificid was started.   Recurrent C. difficile colitis. Patient had a transient hypotension due to dehydration from diarrhea.  Received IV fluids, blood pressure is better. Stool study came back positive for C. difficile pathogen as well as toxin.  This is the second time patient has C. difficile colitis diagnosed.  Patient be treated with Dificid. Patient had temperature 100.4 3/6, but clinically improving, diarrhea is slowing down.  KUB did not show any evidence of megacolon. 3/7. Patient still has multiple loose stools a day, but amount of smaller.  Patient has good appetite without nausea vomiting.  I will continue Dificid, also added Creon. Per infective disease pharmacy recommendation, patient will be treated with Dificid twice a day for 5 days if condition improves,, followed with 200 mg every other day for 20 days. --completed Dificid 200 mg BID x 10 days --outpatient f/u with Dr. Tobi Ortega for possible stool transplant if tx failed  Abdominal pain and cramping  --pt reported no improvement at all.  Pain and cramps with  passing mucousy stool. --per discussion with GI, trial colestipol to slow down bowel movement, avoid Lomotil. --cont colestipol --cont Bentyl  Mild orthostasis --s/p gentle MIVF  Hypokalemia Hypomagnesemia Hypophosphatemia. --monitor and supplement PRN  Hyponatremia.   Anemia of chronic disease  Recent iron study and B12 study are within normal limits.  Hemoglobin has been stable.  Reactive thrombocytosis.   Chronic hypotension. --cont midodrine 10 TID   COPD. Stable,   Coronary artery disease  Stable.  Weakness --PT/OT --SNF rehab     DVT prophylaxis: Lovenox SQ Code Status: Full code  Family Communication:  Level of care: Med-Surg Dispo:   The patient is from: home Anticipated d/c is to: SNF rehab Anticipated d/c date is: whenever bed available   Subjective and Interval History:  Continue to have more solid stool.  Still having abdominal cramps but less frequent.   Objective: Vitals:   10/01/23 2010 10/02/23 0822 10/02/23 0850 10/02/23 1616  BP: 117/67 115/68 119/81 126/75  Pulse: 74 74 81 75  Resp: 16 16 17 18   Temp: 98.4 F (36.9 C) 98 F (36.7 C) 98.1 F (36.7 C) 98 F (36.7 C)  TempSrc:  Oral Oral Oral  SpO2: 100% 100% 100% 100%  Weight:      Height:       No intake or output data in the 24 hours ending 10/02/23 2002  Filed Weights   09/18/23 1522  Weight: 52.2 kg    Examination:   Constitutional: NAD, AAOx3 HEENT: conjunctivae and lids normal, EOMI CV: No cyanosis.   RESP: normal respiratory effort, on RA Neuro: II - XII grossly intact.   Psych: Normal mood and  affect.     Data Reviewed: I have personally reviewed labs and imaging studies  Time spent: 25 minutes  Abigail Priestly, MD [facility] If 7PM-7AM, please contact night-coverage 10/02/2023, 8:02 PM

## 2023-10-02 NOTE — Progress Notes (Signed)
 Occupational Therapy Treatment Patient Details Name: MARCIA Ortega MRN: [id number] DOB: 03-17-1951 Today's Date: 10/02/2023   History of present illness Pt is a 73 yo female that presented to ED for watery, bloody diarrhea, workup positive for cdiff. Noted to have had recent hospitalizations (dec 2024 and 08/02/2023-08/20/2023) for similar concerns, PMH of  CAD s/p DES, breast CA s/p chemorads and mastectomy, hypothyroidism, HTN, chronic hypotension on midodrine, COPD.   OT comments  Pt is supine in bed on arrival. Easily arousable and agreeable to OT session. She does not mention pain. Pt performed bed mobility with MOD I. STS and SPT to [facility] with SUP. UB bathing performed with set up assist seated on BSC. Min A for LB dressing to manage gown. LB bathing performed with SBA/CGA for peri-hygiene in standing. Linens changed while pt on BSC d/t incontinent BM. Pt's lunch arriving and wished to return to bed to eat.  Pt returned to bed with all needs in place and will cont to require skilled acute OT services to maximize her safety and IND to return to PLOF.       If plan is discharge home, recommend the following:  A little help with walking and/or transfers;A lot of help with bathing/dressing/bathroom;Assist for transportation;Assistance with cooking/housework;Help with stairs or ramp for entrance   Equipment Recommendations  None recommended by OT    Recommendations for Other Services      Precautions / Restrictions Precautions Precautions: Fall Recall of Precautions/Restrictions: Intact Restrictions Weight Bearing Restrictions Per Provider Order: No       Mobility Bed Mobility Overal bed mobility: Modified Independent                  Transfers Overall transfer level: Needs assistance   Transfers: Sit to/from Stand, Bed to chair/wheelchair/BSC Sit to Stand: Supervision     Step pivot transfers: Supervision, Contact guard assist           Balance Overall balance  assessment: Modified Independent Sitting-balance support: Feet supported Sitting balance-Leahy Scale: Normal     Standing balance support: Bilateral upper extremity supported, During functional activity Standing balance-Leahy Scale: Good Standing balance comment: HHA with CGA/SBA for SPT to [facility] and back to bed                           ADL either performed or assessed with clinical judgement   ADL Overall ADL's : Needs assistance/impaired     Grooming: Wash/dry face;Set up   Upper Body Bathing: Supervision/ safety;Sitting   Lower Body Bathing: Supervison/ safety;Sit to/from stand;Bed level   Upper Body Dressing : Sitting;Supervision/safety       Toilet Transfer: Contact guard assist;BSC/3in1 Statistician Details (indicate cue type and reason): HHA Toileting- Clothing Manipulation and Hygiene: Sit to/from stand;Supervision/safety;Contact guard assist              Extremity/Trunk Assessment              Vision       Perception     Praxis     Communication Communication Communication: No apparent difficulties   Cognition Arousal: Alert Behavior During Therapy: WFL for tasks assessed/performed                                 Following commands: Intact Following commands impaired: Follows one step commands with increased time      Cueing  Cueing Techniques: Verbal cues  Exercises      Shoulder Instructions       General Comments linen change d/t incontinent BM in bed    Pertinent Vitals/ Pain       Pain Assessment Pain Assessment: No/denies pain  Home Living                                          Prior Functioning/Environment              Frequency  Min 1X/week        Progress Toward Goals  OT Goals(current goals can now be found in the care plan section)  Progress towards OT goals: Progressing toward goals  Acute Rehab OT Goals Patient Stated Goal: improve  strength/endurance OT Goal Formulation: With patient Time For Goal Achievement: 10/16/23 Potential to Achieve Goals: Good ADL Goals Pt Will Perform Lower Body Bathing: with modified independence;sit to/from stand Pt Will Perform Lower Body Dressing: with modified independence;sitting/lateral leans;sit to/from stand Pt Will Perform Toileting - Clothing Manipulation and hygiene: with modified independence;sitting/lateral leans;sit to/from stand  Plan      Co-evaluation                 AM-PAC OT "6 Clicks" Daily Activity     Outcome Measure   Help from another person eating meals?: None Help from another person taking care of personal grooming?: None Help from another person toileting, which includes using toliet, bedpan, or urinal?: A Little Help from another person bathing (including washing, rinsing, drying)?: A Little Help from another person to put on and taking off regular upper body clothing?: A Little Help from another person to put on and taking off regular lower body clothing?: A Little 6 Click Score: 20    End of Session    OT Visit Diagnosis: Other abnormalities of gait and mobility (R26.89);Muscle weakness (generalized) (M62.81)   Activity Tolerance Patient tolerated treatment well   Patient Left in bed;with bed alarm set;with call bell/phone within reach   Nurse Communication Mobility status        Time: 2440-1027 OT Time Calculation (min): 23 min  Charges: OT General Charges $OT Visit: 1 Visit OT Treatments $Self Care/Home Management : 23-37 mins  Akayla Brass, OTR/L  10/02/23, 3:32 PM   Arrietty Dercole E Romyn Boswell 10/02/2023, 3:31 PM

## 2023-10-02 NOTE — Plan of Care (Signed)
  Problem: Coping: Goal: Level of anxiety will decrease Outcome: Progressing   Problem: Elimination: Goal: Will not experience complications related to urinary retention Outcome: Progressing   Problem: Pain Managment: Goal: General experience of comfort will improve and/or be controlled Outcome: Progressing   Problem: Safety: Goal: Ability to remain free from injury will improve Outcome: Progressing

## 2023-10-03 ENCOUNTER — Inpatient Hospital Stay

## 2023-10-03 DIAGNOSIS — A09 Infectious gastroenteritis and colitis, unspecified: Secondary | ICD-10-CM | POA: Diagnosis not present

## 2023-10-03 NOTE — Plan of Care (Signed)

## 2023-10-03 NOTE — Progress Notes (Signed)
 PROGRESS NOTE    Abigail Ortega  JYN:[id number] DOB: 04-22-1951 DOA: 09/18/2023 PCP: Margaretann Loveless, MD  201A/[id number]-AA  LOS: 14 days   Brief hospital course:   Assessment & Plan: Abigail Ortega is a 73 y.o. female with medical history significant for CAD s/p DES, breast CA s/p chemorads and mastectomy, hypothyroidism, HTN, chronic hypotension on midodrine, and C. difficile colitis December 2024 treated with vancomycin, rehospitalized from 1/16 to 08/20/2023 with recurrent diarrhea ruled out for C. 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  Stool study positive for C. difficile antigen and toxin.  Dificid was started.   Recurrent C. difficile colitis. Patient had a transient hypotension due to dehydration from diarrhea.  Received IV fluids, blood pressure is better. Stool study came back positive for C. difficile pathogen as well as toxin.  This is the second time patient has C. difficile colitis diagnosed.  Patient be treated with Dificid. Patient had temperature 100.4 3/6, but clinically improving, diarrhea is slowing down.  KUB did not show any evidence of megacolon. 3/7. Patient still has multiple loose stools a day, but amount of smaller.  Patient has good appetite without nausea vomiting.  I will continue Dificid, also added Creon. Per infective disease pharmacy recommendation, patient will be treated with Dificid twice a day for 5 days if condition improves,, followed with 200 mg every other day for 20 days. --completed Dificid 200 mg BID x 10 days --started having formed stool on 3/17 --outpatient f/u with Dr. Tobi Bastos for possible stool transplant if tx failed  Abdominal pain and cramping  --pt reported no  improvement at all.  Pain and cramps with passing mucousy stool. --per discussion with GI, trial colestipol to slow down bowel movement, avoid Lomotil. --cont colestipol --cont Bentyl  Mild orthostasis --s/p gentle MIVF  Hypokalemia Hypomagnesemia Hypophosphatemia. --monitor and supplement PRN  Hyponatremia.   Anemia of chronic disease  Recent iron study and B12 study are within normal limits.  Hemoglobin has been stable.  Reactive thrombocytosis.   Chronic hypotension. --cont midodrine   COPD. Stable,   Coronary artery disease  Stable.  Weakness --PT/OT --SNF rehab  Scalp hematoma --had an unwitnessed fall this morning, resulting in a large bump in the back of her head.  CT head showed "Large right posterior parietal occipital scalp hematoma without underlying fracture." --expect to resolve on its own --pain control     DVT prophylaxis: Lovenox SQ Code Status: Full code  Family Communication:  Level of care: Med-Surg Dispo:   The patient is from: home Anticipated d/c is to: SNF rehab Anticipated d/c date is: whenever bed available   Subjective and Interval History:  Pt had an unwitnessed fall this morning, resulting in a large bump in the back of her head.  CT head showed "Large right posterior parietal occipital scalp hematoma without underlying fracture."   Objective: Vitals:   10/03/23 0919 10/03/23 1550 10/03/23 1636 10/03/23 2017  BP: (!) 116/45 110/74 128/73 (!) 147/74  Pulse: (!) 106 84 72 66  Resp: 16 16 17 20   Temp: 98.6 F (37 C) 98.3 F (36.8 C) 98.1 F (36.7 C) 99 F (37.2 C)  TempSrc: Oral Oral Oral Oral  SpO2: 100% 100% 100% 100%  Weight:      Height:  Intake/Output Summary (Last 24 hours) at 10/03/2023 2023 Last data filed at 10/03/2023 1900 Gross per 24 hour  Intake 420 ml  Output --  Net 420 ml    Filed Weights   09/18/23 1522  Weight: 52.2 kg    Examination:   Constitutional: NAD, AAOx3 HEENT: conjunctivae  and lids normal, EOMI.  A bump the size of goose egg on the back of the head CV: No cyanosis.   RESP: normal respiratory effort, on RA Neuro: II - XII grossly intact.     Data Reviewed: I have personally reviewed labs and imaging studies  Time spent: 50 minutes  Darlin Priestly, MD [facility] If 7PM-7AM, please contact night-coverage 10/03/2023, 8:23 PM

## 2023-10-03 NOTE — Plan of Care (Signed)

## 2023-10-03 NOTE — TOC Progression Note (Signed)
 Transition of Care [facility]) - Progression Note    Patient Details  Name: Abigail Ortega MRN: [id number] Date of Birth: 1951/03/05  Transition of [facility]) CM/SW Contact  Chapman Fitch, RN Phone Number: 10/03/2023, 8:49 AM  Clinical Narrative:     Insurance auth pending for [employer]                                               Social Determinants of Health (SDOH) Interventions SDOH Screenings   Food Insecurity: No Food Insecurity (09/18/2023)  Housing: Unknown (09/18/2023)  Transportation Needs: No Transportation Needs (09/18/2023)  Utilities: Not At Risk (09/18/2023)  Social Connections: Moderately Integrated (09/18/2023)  Tobacco Use: Medium Risk (09/18/2023)    Readmission Risk Interventions     No data to display

## 2023-10-03 NOTE — Progress Notes (Addendum)
 Patient was found on the floor by staff. She was hollering out for help. She had gotten up on her own to throw her bed pad in the linen hamper. She said she fell and hit her head on the floor. She did have a floor mat down but missed the mat when she fell. VSS. Patient assisted back to the bed. She said her head was hurting and she felt dizzy. MD notified. Head CT ordered  10/03/23 0919  What Happened  Was fall witnessed? No  Was patient injured? Yes  Patient found on floor  Found by Staff-comment De Nurse CNA)  Stated prior activity ambulating-unassisted  Provider Notification  Provider Name/Title Dr Fran Lowes  Date Provider Notified 10/03/23  Time Provider Notified [phone]  Method of Notification Page  Notification Reason Fall  Provider response See new orders  Date of Provider Response 10/03/23  Time of Provider Response [phone]  Follow Up  Family notified Yes - comment (Son, Marcial Pacas, did not answer. His voicemail is too full to take, messages. Message left on daughters, Elita Quick, voicemail)  Additional tests Yes-comment (head CT)  Adult Fall Risk Assessment  Risk Factor Category (scoring not indicated) Fall has occurred during this admission (document High fall risk)  Patient Fall Risk Level High fall risk  Adult Fall Risk Interventions  Required Bundle Interventions *See Row Information* High fall risk - low, moderate, and high requirements implemented  Additional Interventions Use of appropriate toileting equipment (bedpan, BSC, etc.)  Fall intervention(s) refused/Patient educated regarding refusal Bed alarm;Nonskid socks;Open door if unsupervised;Yellow bracelet;Supervision while toileting/edge of bed sitting  Screening for Fall Injury Risk (To be completed on HIGH fall risk patients) - Assessing Need for Floor Mats  Risk For Fall Injury- Criteria for Floor Mats None identified - No additional interventions needed  Vitals  Temp 98.6 F (37 C)  Temp Source Oral  BP (!) 116/45  MAP (mmHg) (!)  60  BP Location Left Arm  BP Method Automatic  Patient Position (if appropriate) Lying  Pulse Rate (!) 106  Resp 16  Oxygen Therapy  SpO2 100 %  O2 Device Room Air  Pain Assessment  Pain Scale 0-10  Pain Score 5  Pain Type Acute pain  Pain Location Head  Pain Orientation Mid  Pain Descriptors / Indicators Throbbing  Pain Frequency Constant  Pain Intervention(s) MD notified (Comment)  Neurological  Neuro (WDL) WDL  Level of Consciousness Alert  Glasgow Coma Scale  Eye Opening 4  Best Verbal Response (NON-intubated) 5  Best Motor Response 6  Glasgow Coma Scale Score 15  Musculoskeletal  Musculoskeletal (WDL) X  Assistive Device BSC;Front wheel walker  Generalized Weakness Yes  Integumentary  Integumentary (WDL) X  Skin Color Appropriate for ethnicity  Skin Integrity Other (Comment) (lump to right side of head)  Pain Assessment  Date Pain First Started 10/03/23  Result of Injury Yes  Pain Assessment  Work-Related Injury No

## 2023-10-03 NOTE — Progress Notes (Signed)
 Mobility Specialist - Progress Note   10/03/23 1400  Mobility  Activity Refused mobility     Pt politely declined OOB activity this date. Per chart review and patient, pt had a fall earlier this date and wants to defer activity at this time. Will attempt another date/time.    Filiberto Pinks Mobility Specialist 10/03/23, 3:00 PM

## 2023-10-04 DIAGNOSIS — A09 Infectious gastroenteritis and colitis, unspecified: Secondary | ICD-10-CM | POA: Diagnosis not present

## 2023-10-04 MED ORDER — COLESTIPOL HCL 1 G PO TABS
2.0000 g | ORAL_TABLET | Freq: Two times a day (BID) | ORAL | Status: DC
  Administered 2023-10-04 – 2023-10-05 (×2): 2 g via ORAL
  Filled 2023-10-04 (×3): qty 2

## 2023-10-04 NOTE — Progress Notes (Signed)
 PROGRESS NOTE    Abigail Ortega  AVW:[id number] DOB: Feb 22, 1951 DOA: 09/18/2023 PCP: Margaretann Loveless, MD  [id number]  LOS: 15 days   Brief hospital course:   Assessment & Plan: Abigail Ortega is a 73 y.o. female with medical history significant for CAD s/p DES, breast CA s/p chemorads and mastectomy, hypothyroidism, HTN, chronic hypotension on midodrine, and C. difficile colitis December 2024 treated with vancomycin, rehospitalized from 1/16 to 08/20/2023 with recurrent diarrhea ruled out for C. 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  Stool study positive for C. difficile antigen and toxin.  Dificid was started.   Recurrent C. difficile colitis. Patient had a transient hypotension due to dehydration from diarrhea.  Received IV fluids, blood pressure is better. Stool study came back positive for C. difficile pathogen as well as toxin.  This is the second time patient has C. difficile colitis diagnosed.  Patient be treated with Dificid. Patient had temperature 100.4 3/6, but clinically improving, diarrhea is slowing down.  KUB did not show any evidence of megacolon. 3/7. Patient still has multiple loose stools a day, but amount of smaller.  Patient has good appetite without nausea vomiting.  I will continue Dificid, also added Creon. Per infective disease pharmacy recommendation, patient will be treated with Dificid twice a day for 5 days if condition improves,, followed with 200 mg every other day for 20 days. --completed Dificid 200 mg BID x 10 days --started having formed stool on 3/17 --outpatient f/u with Dr. Tobi Bastos for possible stool transplant if tx failed  Abdominal pain and cramping  --pt reported no  improvement at all.  Pain and cramps with passing mucousy stool. --per discussion with GI, trial colestipol to slow down bowel movement, avoid Lomotil. --taper down clestipol to 2g BID --cont Bentyl  Mild orthostasis --s/p gentle MIVF  Hypokalemia Hypomagnesemia Hypophosphatemia. --monitor and supplement PRN  Hyponatremia.   Anemia of chronic disease  Recent iron study and B12 study are within normal limits.  Hemoglobin has been stable.  Reactive thrombocytosis.   Chronic hypotension. --cont midodrine   COPD. Stable,   Coronary artery disease  Stable.  Weakness --PT/OT --SNF rehab  Scalp hematoma 2/2 fall --had an unwitnessed fall this morning, resulting in a large bump in the back of her head.  CT head showed "Large right posterior parietal occipital scalp hematoma without underlying fracture." --expect to resolve on its own --Norco PRN     DVT prophylaxis: Lovenox SQ Code Status: Full code  Family Communication:  Level of care: Med-Surg Dispo:   The patient is from: home Anticipated d/c is to: SNF rehab Anticipated d/c date is: whenever bed available   Subjective and Interval History:  Pt complained of pain in her head bump.  Performed peer-to-peer today for SNF rehab.   Objective: Vitals:   10/04/23 0439 10/04/23 0814 10/04/23 1503 10/04/23 1955  BP: 125/73 92/68 114/71 112/68  Pulse: 71 82 73 70  Resp: 16 18 18 16   Temp: 98.8 F (37.1 C) 98.2 F (36.8 C) 98.2 F (36.8 C) 98.6 F (37 C)  TempSrc:  Oral Oral Oral  SpO2: 100% 100% 100% 99%  Weight:      Height:        Intake/Output Summary (Last 24 hours) at 10/04/2023  2114 Last data filed at 10/04/2023 0900 Gross per 24 hour  Intake 120 ml  Output --  Net 120 ml    Filed Weights   09/18/23 1522  Weight: 52.2 kg    Examination:   Constitutional: NAD, AAOx3 HEENT: conjunctivae and lids normal, EOMI CV: No cyanosis.   RESP: normal respiratory effort, on RA Neuro: II - XII  grossly intact.     Data Reviewed: I have personally reviewed labs and imaging studies  Time spent: 35 minutes  Darlin Priestly, MD [facility] If 7PM-7AM, please contact night-coverage 10/04/2023, 9:14 PM

## 2023-10-04 NOTE — TOC Progression Note (Addendum)
 Transition of Care [facility]) - Progression Note    Patient Details  Name: Abigail Ortega MRN: [id number] Date of Birth: 15-Nov-1950  [facility]) CM/SW Contact  Chapman Fitch, RN Phone Number: 10/04/2023, 9:02 AM  Clinical Narrative:      Insurance auth still pending for [employer].  Peer to peer has been offered. Has to be completed by 130 PM.  MD aware       Expected Discharge Plan and Services                                               Social Determinants of Health (SDOH) Interventions SDOH Screenings   Food Insecurity: No Food Insecurity (09/18/2023)  Housing: Unknown (09/18/2023)  Transportation Needs: No Transportation Needs (09/18/2023)  Utilities: Not At Risk (09/18/2023)  Social Connections: Moderately Integrated (09/18/2023)  Tobacco Use: Medium Risk (09/18/2023)    Readmission Risk Interventions     No data to display

## 2023-10-04 NOTE — Progress Notes (Signed)
 Mobility Specialist - Progress Note   10/04/23 1100  Mobility  Activity Refused mobility     Pt declined mobility; reports she just finished OT session minutes prior to entry. Will attempt another da/te/time.    Filiberto Pinks Mobility Specialist 10/04/23, 11:38 AM

## 2023-10-04 NOTE — Progress Notes (Signed)
 Physical Therapy Treatment Patient Details Name: Abigail Ortega MRN: [id number] DOB: Aug 19, 1950 Today's Date: 10/04/2023   History of Present Illness Pt is a 73 yo female that presented to ED for watery, bloody diarrhea, workup positive for cdiff. Noted to have had recent hospitalizations (dec 2024 and 08/02/2023-08/20/2023) for similar concerns, PMH of  CAD s/p DES, breast CA s/p chemorads and mastectomy, hypothyroidism, HTN, chronic hypotension on midodrine, COPD. Hospital stay complicated by fall on 3/19- with large hematoma on post head. Imaging clear.    PT Comments  Goals addressed this session. Pt is s/p fall yesterday and cleared to work with therapy. Pt still complains of pain- RN notified. Pt agreeable to session, however limited by pain and fear of falling. Pt able to ambulate short distance in room, needing assist with RW due to unsteadiness. Able to have BM on BSC and standing at sink for hand hygiene. Will continue to progress as able.    If plan is discharge home, recommend the following: A little help with walking and/or transfers;A little help with bathing/dressing/bathroom;Assist for transportation;Help with stairs or ramp for entrance   Can travel by private vehicle     Yes  Equipment Recommendations  None recommended by PT    Recommendations for Other Services       Precautions / Restrictions Precautions Precautions: Fall Recall of Precautions/Restrictions: Intact Restrictions Weight Bearing Restrictions Per Provider Order: No     Mobility  Bed Mobility Overal bed mobility: Needs Assistance Bed Mobility: Supine to Sit, Sit to Supine     Supine to sit: Min assist Sit to supine: Min assist   General bed mobility comments: takes extended time and reports increased pain with transition. Once seated at EOB, upright posture noted    Transfers Overall transfer level: Needs assistance Equipment used: Rolling walker (2 wheels) Transfers: Sit to/from Stand, Bed  to chair/wheelchair/BSC Sit to Stand: Contact guard assist   Step pivot transfers: Contact guard assist       General transfer comment: unsteady and heavy cues for sequencing. Pt is fearful of falling and gets anxious    Ambulation/Gait Ambulation/Gait assistance: Contact guard assist Gait Distance (Feet): 15 Feet Assistive device: Rolling walker (2 wheels) Gait Pattern/deviations: Step-through pattern, Decreased step length - right, Decreased step length - left, Decreased stride length, Trunk flexed       General Gait Details: very hesitant to perform ambulation secondary to fear of falling. Increased dizziness and pain in head with gait. Needs cues for RW sequencing   Stairs             Wheelchair Mobility     Tilt Bed    Modified Rankin (Stroke Patients Only)       Balance Overall balance assessment: Needs assistance Sitting-balance support: Feet supported Sitting balance-Leahy Scale: Good     Standing balance support: Bilateral upper extremity supported, During functional activity Standing balance-Leahy Scale: Fair                              Hotel manager: No apparent difficulties  Cognition Arousal: Alert Behavior During Therapy: WFL for tasks assessed/performed   PT - Cognitive impairments: No apparent impairments                       PT - Cognition Comments: repeats self several times relating to incident. Pleasant this date and willing to participate Following commands: Intact Following commands impaired:  Follows one step commands with increased time    Cueing Cueing Techniques: Verbal cues  Exercises Other Exercises Other Exercises: ambulated to [facility] with incontinent BM. Needs supervision for hygiene. Was then able to stand at the sink for hand washing.    General Comments        Pertinent Vitals/Pain Pain Assessment Pain Assessment: 0-10 Pain Score: 7  Pain Location: abodminal pain  and head pain Pain Descriptors / Indicators: Discomfort Pain Intervention(s): Limited activity within patient's tolerance, Repositioned, Patient requesting pain meds-RN notified    Home Living                          Prior Function            PT Goals (current goals can now be found in the care plan section) Acute Rehab PT Goals Patient Stated Goal: to feel better PT Goal Formulation: With patient Time For Goal Achievement: 10/18/23 Potential to Achieve Goals: Good Progress towards PT goals: Progressing toward goals    Frequency    Min 1X/week      PT Plan      Co-evaluation              AM-PAC PT "6 Clicks" Mobility   Outcome Measure  Help needed turning from your back to your side while in a flat bed without using bedrails?: A Little Help needed moving from lying on your back to sitting on the side of a flat bed without using bedrails?: A Little Help needed moving to and from a bed to a chair (including a wheelchair)?: A Little Help needed standing up from a chair using your arms (e.g., wheelchair or bedside chair)?: A Little Help needed to walk in hospital room?: A Lot Help needed climbing 3-5 steps with a railing? : A Lot 6 Click Score: 16    End of Session Equipment Utilized During Treatment: Gait belt Activity Tolerance: Patient limited by pain Patient left: in bed;with call bell/phone within reach;with bed alarm set Nurse Communication: Mobility status PT Visit Diagnosis: Muscle weakness (generalized) (M62.81)     Time: 0937-1000 PT Time Calculation (min) (ACUTE ONLY): 23 min  Charges:    $Gait Training: 8-22 mins $Therapeutic Activity: 8-22 mins PT General Charges $$ ACUTE PT VISIT: 1 Visit                     Elizabeth Palau, PT, DPT, GCS [phone]    Abigail Ortega 10/04/2023, 10:57 AM

## 2023-10-05 ENCOUNTER — Other Ambulatory Visit: Payer: Self-pay

## 2023-10-05 DIAGNOSIS — A09 Infectious gastroenteritis and colitis, unspecified: Secondary | ICD-10-CM | POA: Diagnosis not present

## 2023-10-05 MED ORDER — PANCRELIPASE (LIP-PROT-AMYL) 24000-76000 UNITS PO CPEP
24000.0000 [IU] | ORAL_CAPSULE | Freq: Three times a day (TID) | ORAL | 0 refills | Status: DC
  Filled 2023-10-05: qty 42, 14d supply, fill #0

## 2023-10-05 MED ORDER — COLESTIPOL HCL 1 G PO TABS: 2.0000 g | ORAL_TABLET | Freq: Two times a day (BID) | ORAL | Status: AC

## 2023-10-05 MED ORDER — TRAZODONE HCL 100 MG PO TABS: 100.0000 mg | ORAL_TABLET | Freq: Every evening | ORAL | Status: AC | PRN

## 2023-10-05 MED ORDER — GERHARDT'S BUTT CREAM: 1.0000 | TOPICAL_CREAM | Freq: Four times a day (QID) | CUTANEOUS | Status: AC | PRN

## 2023-10-05 MED ORDER — PANCRELIPASE (LIP-PROT-AMYL) 24000-76000 UNITS PO CPEP: 24000.0000 [IU] | ORAL_CAPSULE | Freq: Three times a day (TID) | ORAL | Status: AC

## 2023-10-05 MED ORDER — HYDROCODONE-ACETAMINOPHEN 5-325 MG PO TABS: 1.0000 | ORAL_TABLET | Freq: Two times a day (BID) | ORAL | 0 refills | Status: AC | PRN

## 2023-10-05 NOTE — TOC Progression Note (Signed)
 Transition of Care [facility]) - Progression Note    Patient Details  Name: Abigail Ortega MRN: [id number] Date of Birth: [birth year]  [facility] CM/SW Contact  Chapman Fitch, RN Phone Number: 10/05/2023, 9:19 AM  Clinical Narrative:      Insurance denial received VM left for son Marcial Pacas to discuss options      Expected Discharge Plan and Services                                               Social Determinants of Health (SDOH) Interventions SDOH Screenings   Food Insecurity: No Food Insecurity (09/18/2023)  Housing: Unknown (09/18/2023)  Transportation Needs: No Transportation Needs (09/18/2023)  Utilities: Not At Risk (09/18/2023)  Social Connections: Moderately Integrated (09/18/2023)  Tobacco Use: Medium Risk (09/18/2023)    Readmission Risk Interventions     No data to display

## 2023-10-05 NOTE — Plan of Care (Signed)
  Problem: Education: Goal: Knowledge of General Education information will improve Description: Including pain rating scale, medication(s)/side effects and non-pharmacologic comfort measures Outcome: Progressing   Problem: Clinical Measurements: Goal: Will remain free from infection Outcome: Progressing Goal: Respiratory complications will improve Outcome: Progressing   

## 2023-10-05 NOTE — Progress Notes (Signed)
 Report called to nurse Evangela at [facility]. Patient will be going when son comes to take her.

## 2023-10-05 NOTE — Progress Notes (Signed)
 Discharge instructions given to patient's son in discharge packet including prescription. Son to give packet to [employer] [employer]. Patient left unit alert with no distress noted by wheelchair. Patient's son driving her to [employer].

## 2023-10-05 NOTE — Discharge Summary (Signed)
 Physician Discharge Summary   Abigail Ortega  female DOB: 1950-09-09  EPP:[id number]  PCP: Margaretann Loveless, MD  Admit date: 09/18/2023 Discharge date: 10/05/2023  Admitted From: home Disposition:  SNF CODE STATUS: Full code   Hospital Course:  For full details, please see H&P, progress notes, consult notes and ancillary notes.  Briefly,  Abigail Ortega is a 73 y.o. female with medical history significant for CAD s/p DES, breast CA s/p chemorads and mastectomy, hypothyroidism, HTN, chronic hypotension on midodrine, and C. difficile colitis December 2024 treated with vancomycin, rehospitalized from 1/16 to 08/20/2023 with recurrent diarrhea ruled out for C. 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  Stool study positive for C. difficile antigen and toxin.  Dificid was started.    Recurrent C. difficile colitis. Stool study came back positive for C. difficile pathogen as well as toxin.  This is the second time patient has C. difficile colitis diagnosed.   KUB did not show any evidence of megacolon. 3/7. Patient still has multiple loose stools a day, but amount of smaller.  Patient has good appetite without nausea vomiting.  added Creon. --completed Dificid 200 mg BID x 10 days --started having formed stool on 3/17 --outpatient f/u with Dr. Tobi Bastos for possible stool transplant if tx failed   Abdominal pain and cramping  --Pain and cramps with passing mucousy stool. --per discussion with GI, trial colestipol to slow down bowel movement, avoid Lomotil. --discharged on colestipol to 2g BID for 14 more days. --cont home Bentyl  Scalp hematoma 2/2 fall --had an unwitnessed fall morning of 3/19, resulting in a large  bump in the back of her head.  CT head showed "Large right posterior parietal occipital scalp hematoma without underlying fracture." --expect to resolve on its own --Norco PRN  Chronic hypotension. --Patient had a transient hypotension due to dehydration from diarrhea.  Received IV fluids, and home midodrine increased to 10 mg TID.   BP improved prior to discharge, midodrine to be resumed at home dose of 5 mg TID. --d/c home Lasix and Imdur   Mild orthostasis --s/p gentle MIVF   Hypokalemia Hypomagnesemia Hypophosphatemia. --monitored and supplemented PRN   Hyponatremia, mild --Na stable 134-137  Anemia of chronic disease  Recent iron study and B12 study are within normal limits.     Reactive thrombocytosis.  COPD. Stable,   Coronary artery disease  Stable. --cont home statin and plavix   Weakness --PT/OT --SNF rehab    Discharge Diagnoses:  Principal Problem:   Diarrhea, recurrent Active Problems:   History of Clostridioides difficile colitis 06/2023   Hypokalemia   Hypomagnesemia   CAD (coronary artery disease)   Breast cancer of lower-inner quadrant of right female breast (HCC)   Depression   Other irritable bowel syndrome   Chronic obstructive lung disease (HCC)   Anemia of chronic disease   C. difficile colitis   Hypophosphatemia   30 Day Unplanned Readmission Risk Score    Flowsheet Row ED to Hosp-Admission (Current) from 09/18/2023 in [facility]  30 Day Unplanned Readmission Risk Score (%) 28.19 Filed at 10/05/2023 1200       This score is the patient's risk of an unplanned readmission within 30 days of being discharged (0 -100%). The  score is based on dignosis, age, lab data, medications, orders, and past utilization.   Low:  0-14.9   Medium: 15-21.9   High: 22-29.9   Extreme: 30 and above         Discharge Instructions:  Allergies as of 10/05/2023       Reactions   Nausea Control [emetrol] Nausea And  Vomiting   Other    Note: burning, nausea with XR 75 mg   Effexor [venlafaxine] Hives, Itching, Rash   Hydroxyzine Rash, Hives        Medication List     STOP taking these medications    furosemide 20 MG tablet Commonly known as: LASIX   isosorbide mononitrate 120 MG 24 hr tablet Commonly known as: IMDUR   loperamide 2 MG capsule Commonly known as: IMODIUM   Senexon-S 8.6-50 MG tablet Generic drug: senna-docusate       TAKE these medications    acetaminophen 650 MG CR tablet Commonly known as: TYLENOL Take 1,300 mg by mouth every 8 (eight) hours as needed for pain.   atorvastatin 80 MG tablet Commonly known as: LIPITOR TAKE 1 TABLET EVERY DAY   clopidogrel 75 MG tablet Commonly known as: PLAVIX TAKE 1 TABLET EVERY DAY (NEED MD APPOINTMENT)   colestipol 1 g tablet Commonly known as: COLESTID Take 2 tablets (2 g total) by mouth 2 (two) times daily for 14 days.   dicyclomine 10 MG capsule Commonly known as: BENTYL Take 1 capsule (10 mg total) by mouth 3 (three) times daily before meals.   ferrous sulfate 325 (65 FE) MG tablet Take 1 tablet (325 mg total) by mouth daily.   fexofenadine 60 MG tablet Commonly known as: ALLEGRA Take 0.5 tablets (30 mg total) by mouth 2 (two) times daily as needed (allergies.).   folic acid 1 MG tablet Commonly known as: FOLVITE Take 1 tablet (1 mg total) by mouth daily.   Gerhardt's butt cream Crea Apply 1 Application topically 4 (four) times daily as needed for irritation. Home med. What changed:  when to take this reasons to take this additional instructions   HYDROcodone-acetaminophen 5-325 MG tablet Commonly known as: NORCO/VICODIN Take 1 tablet by mouth 2 (two) times daily as needed for up to 5 days for moderate pain (pain score 4-6) or severe pain (pain score 7-10).   hydrocortisone 2.5 % cream APPLY PEA SIZED TO RECTAL REGION EVERY 8 HOURS AS NEEDED FOR RELIEF   levothyroxine 88 MCG tablet Commonly known  as: SYNTHROID Take 1 tablet (88 mcg total) by mouth daily before breakfast.   lidocaine 5 % Commonly known as: Lidoderm Place 1 patch onto the skin every 12 (twelve) hours. Remove & Discard patch within 12 hours or as directed by MD   midodrine 10 MG tablet Commonly known as: PROAMATINE Take 5 mg by mouth 3 (three) times daily.   montelukast 10 MG tablet Commonly known as: SINGULAIR TAKE 1 TABLET AT BEDTIME   ondansetron 4 MG disintegrating tablet Commonly known as: ZOFRAN-ODT Take 1 tablet (4 mg total) by mouth every 6 (six) hours as needed for nausea or vomiting.   Pancrelipase (Lip-Prot-Amyl) 24000-76000 units Cpep Take 1 capsule (24,000 Units total) by mouth 3 (three) times daily before meals for 14 days.   pantoprazole 40 MG tablet Commonly known as: Protonix Take 1 tablet (40 mg total) by mouth daily.   polycarbophil 625 MG tablet Commonly known as: FIBERCON Take 1 tablet (625 mg total) by mouth daily.   ranolazine 500 MG  12 hr tablet Commonly known as: RANEXA TAKE 1 TABLET TWICE DAILY   traZODone 100 MG tablet Commonly known as: DESYREL Take 1 tablet (100 mg total) by mouth at bedtime as needed for sleep. Home med. What changed:  when to take this reasons to take this additional instructions         Contact information for follow-up providers     Margaretann Loveless, MD Follow up.   Specialty: Internal Medicine Contact information: [address] [place] [address] [phone]              Contact information for after-discharge care     Destination     [facility] Preferred SNF .   Service: Skilled Nursing Contact information: [address] [place] [address] [phone]                     Allergies  Allergen Reactions   Nausea Control [Emetrol] Nausea And Vomiting   Other     Note: burning, nausea with XR 75 mg   Effexor  [Venlafaxine] Hives, Itching and Rash   Hydroxyzine Rash and Hives     The results of significant diagnostics from this hospitalization (including imaging, microbiology, ancillary and laboratory) are listed below for reference.   Consultations:   Procedures/Studies: CT HEAD WO CONTRAST ( ) Result Date: 10/03/2023 CLINICAL DATA:  Fall.  Trauma to head. EXAM: CT HEAD WITHOUT CONTRAST TECHNIQUE: Contiguous axial images were obtained from the base of the skull through the vertex without intravenous contrast. RADIATION DOSE REDUCTION: This exam was performed according to the departmental dose-optimization program which includes automated exposure control, adjustment of the mA and/or kV according to patient size and/or use of iterative reconstruction technique. COMPARISON:  CT head without contrast 02/26/2023. FINDINGS: Brain: No acute infarct, hemorrhage, or mass lesion is present. Mild atrophy and white matter changes are similar to the prior study. Deep brain nuclei are within normal limits. The ventricles are of normal size. No significant extraaxial fluid collection is present. The brainstem and cerebellum are within normal limits. Midline structures are within normal limits. Vascular: Atherosclerotic calcifications are present within the cavernous internal carotid arteries bilaterally. No hyperdense vessel is Skull: A large right posterior parietal occipital scalp hematoma is present. No underlying fracture is present. Calvarium is intact. No focal lytic or blastic lesions are present. No other focal soft tissue injury is present. Sinuses/Orbits: The paranasal sinuses and mastoid air cells are clear. The globes and orbits are within normal limits. IMPRESSION: 1. Large right posterior parietal occipital scalp hematoma without underlying fracture. 2. No acute intracranial abnormality or significant interval change. 3. Mild atrophy and white matter disease likely reflects the sequela of chronic  microvascular ischemia. Electronically Signed   By: Marin Roberts M.D.   On: 10/03/2023 14:05   DG Abd 1 View Result Date: 09/27/2023 CLINICAL DATA:  Abdominal pain.  C difficile diarrhea. EXAM: ABDOMEN - 1 VIEW COMPARISON:  Abdominal radiograph dated 09/20/2023 and CT dated 09/18/2023. FINDINGS: No bowel dilatation or evidence of obstruction. No free air or radiopaque calculi. The osseous structures are intact. The soft tissues are unremarkable. Vascular calcifications noted. IMPRESSION: Nonobstructive bowel gas pattern. Electronically Signed   By: Elgie Collard M.D.   On: 09/27/2023 11:55   DG Abd 1 View Result Date: 09/20/2023 CLINICAL DATA:  Fever and inter colitis.  Concern for megacolon. EXAM: ABDOMEN - 1 VIEW COMPARISON:  CT abdomen  pelvis dated September 18, 2023. FINDINGS: The bowel gas pattern is normal. Normal caliber colon. Areas of wall thickening seen in the transverse and descending colon. No radio-opaque calculi or other significant radiographic abnormality are seen. No acute osseous abnormality. IMPRESSION: 1. Areas of wall thickening in the transverse and descending colon, consistent with colitis. No megacolon. Electronically Signed   By: Obie Dredge M.D.   On: 09/20/2023 12:24   CT ABDOMEN PELVIS W CONTRAST Result Date: 09/18/2023 CLINICAL DATA:  Acute abdominal pain and known C difficile infection with increasing diarrhea, initial encounter EXAM: CT ABDOMEN AND PELVIS WITH CONTRAST TECHNIQUE: Multidetector CT imaging of the abdomen and pelvis was performed using the standard protocol following bolus administration of intravenous contrast. RADIATION DOSE REDUCTION: This exam was performed according to the departmental dose-optimization program which includes automated exposure control, adjustment of the mA and/or kV according to patient size and/or use of iterative reconstruction technique. CONTRAST:  OMNIPAQUE IOHEXOL 300 MG/ML  SOLN COMPARISON:  08/15/2023 FINDINGS: Lower  chest: No acute abnormality. Hepatobiliary: No focal liver abnormality is seen. No gallstones, gallbladder wall thickening, or biliary dilatation. Pancreas: Unremarkable. No pancreatic ductal dilatation or surrounding inflammatory changes. Spleen: Normal in size without focal abnormality. Adrenals/Urinary Tract: Adrenal glands are within normal limits. Kidneys demonstrate a normal enhancement pattern bilaterally. No renal calculi or obstructive changes are noted. The bladder is partially distended. Stomach/Bowel: Wall thickening is noted throughout the colon and rectum increased when compared with the prior exam consistent with diffuse colitis. This corresponds with the patient's given clinical history. The appendix is not well visualized. No inflammatory changes are noted to suggest appendicitis. Small bowel and stomach are within normal limits. Vascular/Lymphatic: Aortic atherosclerosis. No enlarged abdominal or pelvic lymph nodes. Reproductive: Uterus and bilateral adnexa are unremarkable. Other: No abdominal wall hernia or abnormality. No abdominopelvic ascites. Musculoskeletal: No acute or significant osseous findings. IMPRESSION: Diffuse colonic inflammatory change consistent with the given clinical history of C difficile infection. No perforation or pericolonic abscess is noted. Electronically Signed   By: Alcide Clever M.D.   On: 09/18/2023 21:08      Labs: BNP (last 3 results) No results for input(s): "BNP" in the last 8760 hours. Basic Metabolic Panel: Recent Labs  Lab 10/01/23 0618 10/02/23 0413  NA 134*  --   K 3.2* 4.2  CL 105  --   CO2 23  --   GLUCOSE 84  --   BUN 8  --   CREATININE 0.87  --   CALCIUM 8.5*  --   MG 1.6* 2.5*   Liver Function Tests: No results for input(s): "AST", "ALT", "ALKPHOS", "BILITOT", "PROT", "ALBUMIN" in the last 168 hours. No results for input(s): "LIPASE", "AMYLASE" in the last 168 hours. No results for input(s): "AMMONIA" in the last 168  hours. CBC: Recent Labs  Lab 10/01/23 0618  WBC 5.8  HGB 9.5*  HCT 27.5*  MCV 92.0  PLT 421*   Cardiac Enzymes: No results for input(s): "CKTOTAL", "CKMB", "CKMBINDEX", "TROPONINI" in the last 168 hours. BNP: Invalid input(s): "POCBNP" CBG: No results for input(s): "GLUCAP" in the last 168 hours. D-Dimer No results for input(s): "DDIMER" in the last 72 hours. Hgb A1c No results for input(s): "HGBA1C" in the last 72 hours. Lipid Profile No results for input(s): "CHOL", "HDL", "LDLCALC", "TRIG", "CHOLHDL", "LDLDIRECT" in the last 72 hours. Thyroid function studies No results for input(s): "TSH", "T4TOTAL", "T3FREE", "THYROIDAB" in the last 72 hours.  Invalid input(s): "FREET3" Anemia work up No  results for input(s): "VITAMINB12", "FOLATE", "FERRITIN", "TIBC", "IRON", "RETICCTPCT" in the last 72 hours. Urinalysis    Component Value Date/Time   COLORURINE AMBER (A) 05/22/2023 0325   APPEARANCEUR HAZY (A) 05/22/2023 0325   APPEARANCEUR Clear 02/26/2014 1546   LABSPEC 1.040 (H) 05/22/2023 0325   LABSPEC 1.012 02/26/2014 1546   PHURINE 5.0 05/22/2023 0325   GLUCOSEU NEGATIVE 05/22/2023 0325   GLUCOSEU Negative 02/26/2014 1546   HGBUR NEGATIVE 05/22/2023 0325   BILIRUBINUR NEGATIVE 05/22/2023 0325   BILIRUBINUR Negative 02/26/2014 1546   KETONESUR NEGATIVE 05/22/2023 0325   PROTEINUR 100 (A) 05/22/2023 0325   NITRITE NEGATIVE 05/22/2023 0325   LEUKOCYTESUR SMALL (A) 05/22/2023 0325   LEUKOCYTESUR 1+ 02/26/2014 1546   Sepsis Labs Recent Labs  Lab 10/01/23 0618  WBC 5.8   Microbiology No results found for this or any previous visit (from the past 240 hours).   Total time spend on discharging this patient, including the last patient exam, discussing the hospital stay, instructions for ongoing care as it relates to all pertinent caregivers, as well as preparing the medical discharge records, prescriptions, and/or referrals as applicable, is 35 minutes.    Darlin Priestly,  MD  [facility] 10/05/2023, 12:27 PM

## 2023-10-05 NOTE — Progress Notes (Signed)
 Occupational Therapy Treatment Patient Details Name: Abigail Ortega MRN: [id number] DOB: [birth year] Today's Date: 10/05/2023   History of present illness Pt is a 73 yo female that presented to ED for watery, bloody diarrhea, workup positive for cdiff. Noted to have had recent hospitalizations (dec 2024 and 08/02/2023-08/20/2023) for similar concerns, PMH of  CAD s/p DES, breast CA s/p chemorads and mastectomy, hypothyroidism, HTN, chronic hypotension on midodrine, COPD. Hospital stay complicated by fall on 3/19- with large hematoma on post head. Imaging clear.   OT comments  Pt is supine in bed on arrival. Easily arousable and agreeable to OT session. She continues to have abdominal cramps. Pt performed bed mobility with SUP, increased time/effort d/t cramps. Pt required SUP for UB dressing to don/doff gown. UB bathing performed seated on BSC with SUP, LB bathing with CGA in standing for peri-care. CGA via HHA for SPT from bed<>BSC. Pt declined further mobility and wished to get back in bed under her warm blankets. Pt's son called during session stating she will be leaving to facility this afternoon.  Pt returned to bed with all needs in place and will cont to require skilled acute OT services to maximize her safety and IND to return to PLOF.       If plan is discharge home, recommend the following:  A little help with walking and/or transfers;A lot of help with bathing/dressing/bathroom;Assist for transportation;Assistance with cooking/housework;Help with stairs or ramp for entrance   Equipment Recommendations  None recommended by OT    Recommendations for Other Services      Precautions / Restrictions Precautions Precautions: Fall Recall of Precautions/Restrictions: Intact Restrictions Weight Bearing Restrictions Per Provider Order: No       Mobility Bed Mobility Overal bed mobility: Needs Assistance Bed Mobility: Supine to Sit, Sit to Supine     Supine to sit: Supervision Sit  to supine: Supervision   General bed mobility comments: increased time/effort to perform d/t abdominal cramps    Transfers Overall transfer level: Needs assistance Equipment used: None Transfers: Sit to/from Stand, Bed to chair/wheelchair/BSC Sit to Stand: Contact guard assist     Step pivot transfers: Contact guard assist     General transfer comment: CGA for STS and SPT to BSC<>bed with cueing for safety     Balance Overall balance assessment: Needs assistance Sitting-balance support: Feet supported Sitting balance-Leahy Scale: Good     Standing balance support: Bilateral upper extremity supported, During functional activity Standing balance-Leahy Scale: Fair Standing balance comment: HHA for SPT bed<>BSC                           ADL either performed or assessed with clinical judgement   ADL Overall ADL's : Needs assistance/impaired     Grooming: Wash/dry face;Set up;Sitting Grooming Details (indicate cue type and reason): on BSC Upper Body Bathing: Supervision/ safety;Sitting Upper Body Bathing Details (indicate cue type and reason): on BSC Lower Body Bathing: Supervison/ safety;Sit to/from stand;Sitting/lateral leans;Contact guard assist Lower Body Bathing Details (indicate cue type and reason): CGA in standing for peri-hygiene; SUP for seated LB bathing on BSC Upper Body Dressing : Sitting;Supervision/safety Upper Body Dressing Details (indicate cue type and reason): to doff/don gowns     Toilet Transfer: Contact guard assist;BSC/3in1 Toilet Transfer Details (indicate cue type and reason): CGA via HHA to [facility] Toileting- Clothing Manipulation and Hygiene: Sit to/from stand;Supervision/safety;Contact guard assist  Extremity/Trunk Assessment              Vision       Restaurant manager, fast food Communication: No apparent difficulties   Cognition Arousal: Alert Behavior During Therapy: WFL for  tasks assessed/performed                                 Following commands: Intact Following commands impaired: Follows one step commands with increased time      Cueing   Cueing Techniques: Verbal cues  Exercises      Shoulder Instructions       General Comments      Pertinent Vitals/ Pain       Pain Assessment Pain Assessment: Faces Faces Pain Scale: Hurts a little bit Pain Location: abdominal cramps Pain Descriptors / Indicators: Discomfort Pain Intervention(s): Monitored during session, Repositioned  Home Living                                          Prior Functioning/Environment              Frequency  Min 1X/week        Progress Toward Goals  OT Goals(current goals can now be found in the care plan section)  Progress towards OT goals: Progressing toward goals  Acute Rehab OT Goals Patient Stated Goal: improve pain/strength OT Goal Formulation: With patient Time For Goal Achievement: 10/16/23 Potential to Achieve Goals: Good  Plan      Co-evaluation                 AM-PAC OT "6 Clicks" Daily Activity     Outcome Measure   Help from another person eating meals?: None Help from another person taking care of personal grooming?: None Help from another person toileting, which includes using toliet, bedpan, or urinal?: A Little Help from another person bathing (including washing, rinsing, drying)?: A Little Help from another person to put on and taking off regular upper body clothing?: A Little Help from another person to put on and taking off regular lower body clothing?: A Little 6 Click Score: 20    End of Session    OT Visit Diagnosis: Other abnormalities of gait and mobility (R26.89);Muscle weakness (generalized) (M62.81)   Activity Tolerance Patient tolerated treatment well   Patient Left in bed;with bed alarm set;with call bell/phone within reach   Nurse Communication Mobility status         Time: 2130-8657 OT Time Calculation (min): 23 min  Charges: OT General Charges $OT Visit: 1 Visit OT Treatments $Self Care/Home Management : 23-37 mins  Falicia Lizotte, OTR/L  10/05/23, 12:44 PM   Coumba Kellison E Garnett Nunziata 10/05/2023, 12:43 PM

## 2023-10-05 NOTE — TOC Transition Note (Signed)
 Transition of Care [facility]) - Discharge Note   Patient Details  Name: ICESIS RENN MRN: [id number] Date of Birth: Apr 28, 1951  Transition of [facility]) CM/SW Contact:  Chapman Fitch, RN Phone Number: 10/05/2023, 12:15 PM   Clinical Narrative:      Patient has been denied insurance for [employer] at [employer] they can still bring her in under LTC Son Ketchum notified.  Provided him with denial and appeal information should he like to appeal  Patient will DC to: [facility] Anticipated DC date: 10/05/23  Family notified:Son Pension scheme manager by: Son Marcial Pacas   Per MD patient ready for DC to . RN, patient, patient's family, and facility notified of DC. Discharge Summary sent to facility. RN given number for report. DC packet on chart. TOC signing off.        Patient Goals and CMS Choice            Discharge Placement                       Discharge Plan and Services Additional resources added to the After Visit Summary for                                       Social Drivers of Health (SDOH) Interventions SDOH Screenings   Food Insecurity: No Food Insecurity (09/18/2023)  Housing: Unknown (09/18/2023)  Transportation Needs: No Transportation Needs (09/18/2023)  Utilities: Not At Risk (09/18/2023)  Social Connections: Moderately Integrated (09/18/2023)  Tobacco Use: Medium Risk (09/18/2023)     Readmission Risk Interventions     No data to display

## 2023-10-06 DIAGNOSIS — D649 Anemia, unspecified: Secondary | ICD-10-CM | POA: Diagnosis not present

## 2023-10-08 ENCOUNTER — Telehealth: Payer: Self-pay | Admitting: *Deleted

## 2023-10-08 DIAGNOSIS — R262 Difficulty in walking, not elsewhere classified: Secondary | ICD-10-CM | POA: Diagnosis not present

## 2023-10-08 DIAGNOSIS — M6281 Muscle weakness (generalized): Secondary | ICD-10-CM | POA: Diagnosis not present

## 2023-10-08 NOTE — Progress Notes (Unsigned)
 Complex Care Management Note Care Guide Note  10/08/2023 Name: Abigail Ortega MRN: [id number] DOB: [birth year]   Complex Care Management Outreach Attempts: An unsuccessful telephone outreach was attempted today to offer the patient information about available complex care management services.  Follow Up Plan:  Additional outreach attempts will be made to offer the patient complex care management information and services.   Encounter Outcome:  No Answer  Burman Nieves, CMA, Care Guide [facility]  [facility] Guide Direct Dial: [phone]  Fax: [phone] Website: [email or web address]

## 2023-10-09 DIAGNOSIS — M6281 Muscle weakness (generalized): Secondary | ICD-10-CM | POA: Diagnosis not present

## 2023-10-09 DIAGNOSIS — R262 Difficulty in walking, not elsewhere classified: Secondary | ICD-10-CM | POA: Diagnosis not present

## 2023-10-09 NOTE — Progress Notes (Unsigned)
 Complex Care Management Note Care Guide Note  10/09/2023 Name: Abigail Ortega MRN: [id number] DOB: 1950/07/27   Complex Care Management Outreach Attempts: A second unsuccessful outreach was attempted today to offer the patient with information about available complex care management services.  Follow Up Plan:  Additional outreach attempts will be made to offer the patient complex care management information and services.   Encounter Outcome:  No Answer  Burman Nieves, CMA, Care Guide [facility]  [facility], [facility] Guide Direct Dial: [phone]  Fax: [phone] Website: [email or web address]

## 2023-10-10 DIAGNOSIS — J449 Chronic obstructive pulmonary disease, unspecified: Secondary | ICD-10-CM | POA: Diagnosis not present

## 2023-10-10 DIAGNOSIS — E876 Hypokalemia: Secondary | ICD-10-CM | POA: Diagnosis not present

## 2023-10-10 DIAGNOSIS — D509 Iron deficiency anemia, unspecified: Secondary | ICD-10-CM | POA: Diagnosis not present

## 2023-10-10 DIAGNOSIS — A0472 Enterocolitis due to Clostridium difficile, not specified as recurrent: Secondary | ICD-10-CM | POA: Diagnosis not present

## 2023-10-10 DIAGNOSIS — A0471 Enterocolitis due to Clostridium difficile, recurrent: Secondary | ICD-10-CM | POA: Diagnosis not present

## 2023-10-10 DIAGNOSIS — I2510 Atherosclerotic heart disease of native coronary artery without angina pectoris: Secondary | ICD-10-CM | POA: Diagnosis not present

## 2023-10-10 DIAGNOSIS — I509 Heart failure, unspecified: Secondary | ICD-10-CM | POA: Diagnosis not present

## 2023-10-10 DIAGNOSIS — I9589 Other hypotension: Secondary | ICD-10-CM | POA: Diagnosis not present

## 2023-10-10 DIAGNOSIS — E871 Hypo-osmolality and hyponatremia: Secondary | ICD-10-CM | POA: Diagnosis not present

## 2023-10-10 NOTE — Progress Notes (Signed)
 Complex Care Management Note Care Guide Note  10/10/2023 Name: KARENE BRACKEN MRN: [id number] DOB: 1951/03/14   Complex Care Management Outreach Attempts: A third unsuccessful outreach was attempted today to offer the patient with information about available complex care management services.  Follow Up Plan:  No further outreach attempts will be made at this time. We have been unable to contact the patient to offer or enroll patient in complex care management services.  Encounter Outcome:  No Answer  Burman Nieves, CMA, Care Guide [facility]  [facility], [facility] Guide Direct Dial: [phone]  Fax: [phone] Website: [email or web address]

## 2023-10-12 ENCOUNTER — Telehealth: Payer: Self-pay

## 2023-10-12 DIAGNOSIS — S0083XD Contusion of other part of head, subsequent encounter: Secondary | ICD-10-CM | POA: Diagnosis not present

## 2023-10-12 DIAGNOSIS — D509 Iron deficiency anemia, unspecified: Secondary | ICD-10-CM | POA: Diagnosis not present

## 2023-10-12 DIAGNOSIS — R109 Unspecified abdominal pain: Secondary | ICD-10-CM | POA: Diagnosis not present

## 2023-10-12 DIAGNOSIS — I9589 Other hypotension: Secondary | ICD-10-CM | POA: Diagnosis not present

## 2023-10-12 DIAGNOSIS — A0471 Enterocolitis due to Clostridium difficile, recurrent: Secondary | ICD-10-CM | POA: Diagnosis not present

## 2023-10-12 DIAGNOSIS — R262 Difficulty in walking, not elsewhere classified: Secondary | ICD-10-CM | POA: Diagnosis not present

## 2023-10-12 DIAGNOSIS — E871 Hypo-osmolality and hyponatremia: Secondary | ICD-10-CM | POA: Diagnosis not present

## 2023-10-12 DIAGNOSIS — W19XXXD Unspecified fall, subsequent encounter: Secondary | ICD-10-CM | POA: Diagnosis not present

## 2023-10-12 DIAGNOSIS — M6281 Muscle weakness (generalized): Secondary | ICD-10-CM | POA: Diagnosis not present

## 2023-10-12 DIAGNOSIS — J449 Chronic obstructive pulmonary disease, unspecified: Secondary | ICD-10-CM | POA: Diagnosis not present

## 2023-10-12 DIAGNOSIS — K5900 Constipation, unspecified: Secondary | ICD-10-CM | POA: Diagnosis not present

## 2023-10-12 NOTE — Telephone Encounter (Signed)
 Called patient's sister-Ann and had to leave a detailed message wanting to know if she was interested to come in for a follow up with Dr. Tobi Bastos. However, I had to leave a detailed message and to call us back to confirmed her appointment.

## 2023-10-12 NOTE — Telephone Encounter (Signed)
-----   Message from Wyline Mood sent at 10/12/2023  8:19 AM EDT ----- Regarding: RE: Recurrent C Diff April 1st 10 30 am - do not add endo patients ointo the same slot - presently empty ----- Message ----- From: Adela Ports, CMA Sent: 10/12/2023   8:17 AM EDT To: Wyline Mood, MD Subject: FW: Recurrent C Diff                           Dr. Tobi Bastos, where would you like for me to put her in the schedule? ----- Message ----- From: Wyline Mood, MD Sent: 10/01/2023  10:00 AM EDT To: Adela Ports, CMA Subject: FW: Recurrent C Diff                            ----- Message ----- From: Jaynie Collins, DO Sent: 09/27/2023  12:37 PM EDT To: Wyline Mood, MD Subject: Recurrent C Diff                               Hi Kiran-  One of your outpatients. Recurrent cdiff. In hospital on dificid (day 9 today)- likely would benefit from vowst or reybiota.  I messaged Eligio Angert to let her know as well.  Thanks, Brett Canales

## 2023-10-13 DIAGNOSIS — R262 Difficulty in walking, not elsewhere classified: Secondary | ICD-10-CM | POA: Diagnosis not present

## 2023-10-13 DIAGNOSIS — M6281 Muscle weakness (generalized): Secondary | ICD-10-CM | POA: Diagnosis not present

## 2023-10-15 DIAGNOSIS — M6281 Muscle weakness (generalized): Secondary | ICD-10-CM | POA: Diagnosis not present

## 2023-10-15 DIAGNOSIS — S0083XD Contusion of other part of head, subsequent encounter: Secondary | ICD-10-CM | POA: Diagnosis not present

## 2023-10-15 DIAGNOSIS — K2100 Gastro-esophageal reflux disease with esophagitis, without bleeding: Secondary | ICD-10-CM | POA: Diagnosis not present

## 2023-10-15 DIAGNOSIS — K5900 Constipation, unspecified: Secondary | ICD-10-CM | POA: Diagnosis not present

## 2023-10-15 DIAGNOSIS — I9589 Other hypotension: Secondary | ICD-10-CM | POA: Diagnosis not present

## 2023-10-15 DIAGNOSIS — R262 Difficulty in walking, not elsewhere classified: Secondary | ICD-10-CM | POA: Diagnosis not present

## 2023-10-15 DIAGNOSIS — W19XXXD Unspecified fall, subsequent encounter: Secondary | ICD-10-CM | POA: Diagnosis not present

## 2023-10-15 DIAGNOSIS — E871 Hypo-osmolality and hyponatremia: Secondary | ICD-10-CM | POA: Diagnosis not present

## 2023-10-15 DIAGNOSIS — D509 Iron deficiency anemia, unspecified: Secondary | ICD-10-CM | POA: Diagnosis not present

## 2023-10-15 DIAGNOSIS — A0471 Enterocolitis due to Clostridium difficile, recurrent: Secondary | ICD-10-CM | POA: Diagnosis not present

## 2023-10-15 NOTE — Telephone Encounter (Signed)
 Called patient and left her a voicemail to call us back. I then called her sister and she did not answer either. I left them a voicemail to call me back.

## 2023-10-16 DIAGNOSIS — E876 Hypokalemia: Secondary | ICD-10-CM | POA: Diagnosis not present

## 2023-10-16 DIAGNOSIS — M6281 Muscle weakness (generalized): Secondary | ICD-10-CM | POA: Diagnosis not present

## 2023-10-16 DIAGNOSIS — E871 Hypo-osmolality and hyponatremia: Secondary | ICD-10-CM | POA: Diagnosis not present

## 2023-10-16 DIAGNOSIS — I2510 Atherosclerotic heart disease of native coronary artery without angina pectoris: Secondary | ICD-10-CM | POA: Diagnosis not present

## 2023-10-16 DIAGNOSIS — J449 Chronic obstructive pulmonary disease, unspecified: Secondary | ICD-10-CM | POA: Diagnosis not present

## 2023-10-16 DIAGNOSIS — I9589 Other hypotension: Secondary | ICD-10-CM | POA: Diagnosis not present

## 2023-10-16 DIAGNOSIS — A0471 Enterocolitis due to Clostridium difficile, recurrent: Secondary | ICD-10-CM | POA: Diagnosis not present

## 2023-10-17 DIAGNOSIS — I9589 Other hypotension: Secondary | ICD-10-CM | POA: Diagnosis not present

## 2023-10-17 DIAGNOSIS — D509 Iron deficiency anemia, unspecified: Secondary | ICD-10-CM | POA: Diagnosis not present

## 2023-10-17 DIAGNOSIS — M6281 Muscle weakness (generalized): Secondary | ICD-10-CM | POA: Diagnosis not present

## 2023-10-17 DIAGNOSIS — E871 Hypo-osmolality and hyponatremia: Secondary | ICD-10-CM | POA: Diagnosis not present

## 2023-10-17 DIAGNOSIS — K5900 Constipation, unspecified: Secondary | ICD-10-CM | POA: Diagnosis not present

## 2023-10-17 DIAGNOSIS — S0083XD Contusion of other part of head, subsequent encounter: Secondary | ICD-10-CM | POA: Diagnosis not present

## 2023-10-17 DIAGNOSIS — W19XXXD Unspecified fall, subsequent encounter: Secondary | ICD-10-CM | POA: Diagnosis not present

## 2023-10-17 DIAGNOSIS — A0471 Enterocolitis due to Clostridium difficile, recurrent: Secondary | ICD-10-CM | POA: Diagnosis not present

## 2023-10-18 NOTE — Telephone Encounter (Signed)
 Called patient's sister again and left her a detailed message to call me back so I could schedule her sister an appointment.

## 2023-10-22 DIAGNOSIS — D509 Iron deficiency anemia, unspecified: Secondary | ICD-10-CM | POA: Diagnosis not present

## 2023-10-22 DIAGNOSIS — S0083XD Contusion of other part of head, subsequent encounter: Secondary | ICD-10-CM | POA: Diagnosis not present

## 2023-10-22 DIAGNOSIS — M6281 Muscle weakness (generalized): Secondary | ICD-10-CM | POA: Diagnosis not present

## 2023-10-22 DIAGNOSIS — K5900 Constipation, unspecified: Secondary | ICD-10-CM | POA: Diagnosis not present

## 2023-10-22 DIAGNOSIS — I2510 Atherosclerotic heart disease of native coronary artery without angina pectoris: Secondary | ICD-10-CM | POA: Diagnosis not present

## 2023-10-22 DIAGNOSIS — A0471 Enterocolitis due to Clostridium difficile, recurrent: Secondary | ICD-10-CM | POA: Diagnosis not present

## 2023-10-22 DIAGNOSIS — E871 Hypo-osmolality and hyponatremia: Secondary | ICD-10-CM | POA: Diagnosis not present

## 2023-10-22 DIAGNOSIS — I9589 Other hypotension: Secondary | ICD-10-CM | POA: Diagnosis not present

## 2023-10-22 DIAGNOSIS — E039 Hypothyroidism, unspecified: Secondary | ICD-10-CM | POA: Diagnosis not present

## 2023-10-22 NOTE — Telephone Encounter (Signed)
 Crystal called on behalf of Mrs. Verastegui to schedule an appointment. I informed her that we would need permission from a family member to discuss the patient's information, as Mrs. Halliday is currently in a nursing home. Crystal stated that she would contact the patient's son and have him call us.

## 2023-10-23 ENCOUNTER — Other Ambulatory Visit: Payer: Self-pay

## 2023-10-24 DIAGNOSIS — I2510 Atherosclerotic heart disease of native coronary artery without angina pectoris: Secondary | ICD-10-CM | POA: Diagnosis not present

## 2023-10-24 DIAGNOSIS — S0083XD Contusion of other part of head, subsequent encounter: Secondary | ICD-10-CM | POA: Diagnosis not present

## 2023-10-24 DIAGNOSIS — K5900 Constipation, unspecified: Secondary | ICD-10-CM | POA: Diagnosis not present

## 2023-10-24 DIAGNOSIS — M6281 Muscle weakness (generalized): Secondary | ICD-10-CM | POA: Diagnosis not present

## 2023-10-24 DIAGNOSIS — I9589 Other hypotension: Secondary | ICD-10-CM | POA: Diagnosis not present

## 2023-10-24 DIAGNOSIS — E039 Hypothyroidism, unspecified: Secondary | ICD-10-CM | POA: Diagnosis not present

## 2023-10-24 DIAGNOSIS — D509 Iron deficiency anemia, unspecified: Secondary | ICD-10-CM | POA: Diagnosis not present

## 2023-10-24 DIAGNOSIS — A0471 Enterocolitis due to Clostridium difficile, recurrent: Secondary | ICD-10-CM | POA: Diagnosis not present

## 2023-10-24 DIAGNOSIS — E871 Hypo-osmolality and hyponatremia: Secondary | ICD-10-CM | POA: Diagnosis not present

## 2023-10-25 MED ORDER — LEVOTHYROXINE SODIUM 88 MCG PO TABS: 88.0000 ug | ORAL_TABLET | Freq: Every day | ORAL | 3 refills | Status: AC

## 2023-10-29 DIAGNOSIS — A0471 Enterocolitis due to Clostridium difficile, recurrent: Secondary | ICD-10-CM | POA: Diagnosis not present

## 2023-10-29 DIAGNOSIS — I9589 Other hypotension: Secondary | ICD-10-CM | POA: Diagnosis not present

## 2023-10-29 DIAGNOSIS — M6281 Muscle weakness (generalized): Secondary | ICD-10-CM | POA: Diagnosis not present

## 2023-10-29 DIAGNOSIS — S0083XD Contusion of other part of head, subsequent encounter: Secondary | ICD-10-CM | POA: Diagnosis not present

## 2023-10-29 DIAGNOSIS — W19XXXD Unspecified fall, subsequent encounter: Secondary | ICD-10-CM | POA: Diagnosis not present

## 2023-10-29 DIAGNOSIS — D509 Iron deficiency anemia, unspecified: Secondary | ICD-10-CM | POA: Diagnosis not present

## 2023-10-29 DIAGNOSIS — K5900 Constipation, unspecified: Secondary | ICD-10-CM | POA: Diagnosis not present

## 2023-10-29 DIAGNOSIS — I2510 Atherosclerotic heart disease of native coronary artery without angina pectoris: Secondary | ICD-10-CM | POA: Diagnosis not present

## 2023-10-29 DIAGNOSIS — E039 Hypothyroidism, unspecified: Secondary | ICD-10-CM | POA: Diagnosis not present

## 2023-10-30 DIAGNOSIS — I5042 Chronic combined systolic (congestive) and diastolic (congestive) heart failure: Secondary | ICD-10-CM | POA: Diagnosis not present

## 2023-10-30 DIAGNOSIS — E559 Vitamin D deficiency, unspecified: Secondary | ICD-10-CM | POA: Diagnosis not present

## 2023-11-02 DIAGNOSIS — A0471 Enterocolitis due to Clostridium difficile, recurrent: Secondary | ICD-10-CM | POA: Diagnosis not present

## 2023-11-05 DIAGNOSIS — J449 Chronic obstructive pulmonary disease, unspecified: Secondary | ICD-10-CM | POA: Diagnosis not present

## 2023-11-05 DIAGNOSIS — E039 Hypothyroidism, unspecified: Secondary | ICD-10-CM | POA: Diagnosis not present

## 2023-11-05 DIAGNOSIS — A0471 Enterocolitis due to Clostridium difficile, recurrent: Secondary | ICD-10-CM | POA: Diagnosis not present

## 2023-11-05 DIAGNOSIS — I951 Orthostatic hypotension: Secondary | ICD-10-CM | POA: Diagnosis not present

## 2023-11-10 ENCOUNTER — Other Ambulatory Visit: Payer: Self-pay | Admitting: Internal Medicine

## 2023-11-15 DIAGNOSIS — A0471 Enterocolitis due to Clostridium difficile, recurrent: Secondary | ICD-10-CM | POA: Diagnosis not present

## 2023-11-28 DIAGNOSIS — M6281 Muscle weakness (generalized): Secondary | ICD-10-CM | POA: Diagnosis not present

## 2023-11-28 DIAGNOSIS — J449 Chronic obstructive pulmonary disease, unspecified: Secondary | ICD-10-CM | POA: Diagnosis not present

## 2023-11-28 DIAGNOSIS — D509 Iron deficiency anemia, unspecified: Secondary | ICD-10-CM | POA: Diagnosis not present

## 2023-11-28 DIAGNOSIS — I2510 Atherosclerotic heart disease of native coronary artery without angina pectoris: Secondary | ICD-10-CM | POA: Diagnosis not present

## 2023-11-29 DIAGNOSIS — K5900 Constipation, unspecified: Secondary | ICD-10-CM | POA: Diagnosis not present

## 2023-11-29 DIAGNOSIS — I2510 Atherosclerotic heart disease of native coronary artery without angina pectoris: Secondary | ICD-10-CM | POA: Diagnosis not present

## 2023-11-29 DIAGNOSIS — Z8619 Personal history of other infectious and parasitic diseases: Secondary | ICD-10-CM | POA: Diagnosis not present

## 2023-11-29 DIAGNOSIS — E039 Hypothyroidism, unspecified: Secondary | ICD-10-CM | POA: Diagnosis not present

## 2023-11-29 DIAGNOSIS — K2100 Gastro-esophageal reflux disease with esophagitis, without bleeding: Secondary | ICD-10-CM | POA: Diagnosis not present

## 2023-12-02 ENCOUNTER — Other Ambulatory Visit: Payer: Self-pay | Admitting: Cardiovascular Disease

## 2023-12-02 DIAGNOSIS — I2510 Atherosclerotic heart disease of native coronary artery without angina pectoris: Secondary | ICD-10-CM

## 2023-12-02 DIAGNOSIS — E782 Mixed hyperlipidemia: Secondary | ICD-10-CM

## 2023-12-13 DIAGNOSIS — E039 Hypothyroidism, unspecified: Secondary | ICD-10-CM | POA: Diagnosis not present

## 2023-12-25 DIAGNOSIS — D509 Iron deficiency anemia, unspecified: Secondary | ICD-10-CM | POA: Diagnosis not present

## 2023-12-25 DIAGNOSIS — J449 Chronic obstructive pulmonary disease, unspecified: Secondary | ICD-10-CM | POA: Diagnosis not present

## 2023-12-25 DIAGNOSIS — I2510 Atherosclerotic heart disease of native coronary artery without angina pectoris: Secondary | ICD-10-CM | POA: Diagnosis not present

## 2023-12-25 DIAGNOSIS — M6281 Muscle weakness (generalized): Secondary | ICD-10-CM | POA: Diagnosis not present

## 2024-01-02 DIAGNOSIS — E039 Hypothyroidism, unspecified: Secondary | ICD-10-CM | POA: Diagnosis not present

## 2024-01-02 DIAGNOSIS — K580 Irritable bowel syndrome with diarrhea: Secondary | ICD-10-CM | POA: Diagnosis not present

## 2024-01-02 DIAGNOSIS — I509 Heart failure, unspecified: Secondary | ICD-10-CM | POA: Diagnosis not present

## 2024-01-02 DIAGNOSIS — I951 Orthostatic hypotension: Secondary | ICD-10-CM | POA: Diagnosis not present

## 2024-01-02 DIAGNOSIS — D509 Iron deficiency anemia, unspecified: Secondary | ICD-10-CM | POA: Diagnosis not present

## 2024-01-02 DIAGNOSIS — M79605 Pain in left leg: Secondary | ICD-10-CM | POA: Diagnosis not present

## 2024-01-02 DIAGNOSIS — M79604 Pain in right leg: Secondary | ICD-10-CM | POA: Diagnosis not present

## 2024-01-02 DIAGNOSIS — M159 Polyosteoarthritis, unspecified: Secondary | ICD-10-CM | POA: Diagnosis not present

## 2024-01-15 DIAGNOSIS — R262 Difficulty in walking, not elsewhere classified: Secondary | ICD-10-CM | POA: Diagnosis not present

## 2024-01-15 DIAGNOSIS — M6281 Muscle weakness (generalized): Secondary | ICD-10-CM | POA: Diagnosis not present

## 2024-01-16 DIAGNOSIS — R262 Difficulty in walking, not elsewhere classified: Secondary | ICD-10-CM | POA: Diagnosis not present

## 2024-01-16 DIAGNOSIS — M6281 Muscle weakness (generalized): Secondary | ICD-10-CM | POA: Diagnosis not present

## 2024-01-22 DIAGNOSIS — K580 Irritable bowel syndrome with diarrhea: Secondary | ICD-10-CM | POA: Diagnosis not present

## 2024-01-22 DIAGNOSIS — D509 Iron deficiency anemia, unspecified: Secondary | ICD-10-CM | POA: Diagnosis not present

## 2024-01-22 DIAGNOSIS — I951 Orthostatic hypotension: Secondary | ICD-10-CM | POA: Diagnosis not present

## 2024-01-22 DIAGNOSIS — I509 Heart failure, unspecified: Secondary | ICD-10-CM | POA: Diagnosis not present

## 2024-01-22 DIAGNOSIS — E039 Hypothyroidism, unspecified: Secondary | ICD-10-CM | POA: Diagnosis not present

## 2024-01-30 DIAGNOSIS — D509 Iron deficiency anemia, unspecified: Secondary | ICD-10-CM | POA: Diagnosis not present

## 2024-01-30 DIAGNOSIS — E039 Hypothyroidism, unspecified: Secondary | ICD-10-CM | POA: Diagnosis not present

## 2024-01-30 DIAGNOSIS — I2510 Atherosclerotic heart disease of native coronary artery without angina pectoris: Secondary | ICD-10-CM | POA: Diagnosis not present

## 2024-01-30 DIAGNOSIS — B3731 Acute candidiasis of vulva and vagina: Secondary | ICD-10-CM | POA: Diagnosis not present

## 2024-01-31 DIAGNOSIS — E039 Hypothyroidism, unspecified: Secondary | ICD-10-CM | POA: Diagnosis not present

## 2024-02-20 ENCOUNTER — Other Ambulatory Visit: Payer: Self-pay

## 2024-02-20 DIAGNOSIS — J441 Chronic obstructive pulmonary disease with (acute) exacerbation: Secondary | ICD-10-CM

## 2024-02-20 DIAGNOSIS — R0602 Shortness of breath: Secondary | ICD-10-CM

## 2024-02-27 ENCOUNTER — Telehealth: Payer: Self-pay | Admitting: Internal Medicine

## 2024-02-27 ENCOUNTER — Encounter: Payer: Self-pay | Admitting: Oncology

## 2024-02-27 DIAGNOSIS — E039 Hypothyroidism, unspecified: Secondary | ICD-10-CM | POA: Diagnosis not present

## 2024-02-27 DIAGNOSIS — K580 Irritable bowel syndrome with diarrhea: Secondary | ICD-10-CM | POA: Diagnosis not present

## 2024-02-27 DIAGNOSIS — I509 Heart failure, unspecified: Secondary | ICD-10-CM | POA: Diagnosis not present

## 2024-02-27 DIAGNOSIS — J449 Chronic obstructive pulmonary disease, unspecified: Secondary | ICD-10-CM | POA: Diagnosis not present

## 2024-02-27 DIAGNOSIS — I9589 Other hypotension: Secondary | ICD-10-CM | POA: Diagnosis not present

## 2024-02-27 NOTE — Telephone Encounter (Addendum)
 Left vm to confirm 03/05/24 appointment-Toni

## 2024-02-28 DIAGNOSIS — Z79899 Other long term (current) drug therapy: Secondary | ICD-10-CM | POA: Diagnosis not present

## 2024-03-05 ENCOUNTER — Encounter: Payer: Medicare HMO | Admitting: Internal Medicine
